# Patient Record
Sex: Female | Born: 1945 | Race: Black or African American | Hispanic: No | Smoking: Former smoker
Health system: Southern US, Community
[De-identification: ages and names within clinical notes are randomized; demographics above are authoritative.]

## PROBLEM LIST (undated history)

## (undated) DIAGNOSIS — H5440 Blindness, one eye, unspecified eye: Secondary | ICD-10-CM

## (undated) DIAGNOSIS — D869 Sarcoidosis, unspecified: Secondary | ICD-10-CM

## (undated) DIAGNOSIS — M24459 Recurrent dislocation, unspecified hip: Secondary | ICD-10-CM

## (undated) DIAGNOSIS — M810 Age-related osteoporosis without current pathological fracture: Secondary | ICD-10-CM

## (undated) DIAGNOSIS — H269 Unspecified cataract: Secondary | ICD-10-CM

## (undated) DIAGNOSIS — Z803 Family history of malignant neoplasm of breast: Secondary | ICD-10-CM

## (undated) DIAGNOSIS — E559 Vitamin D deficiency, unspecified: Secondary | ICD-10-CM

## (undated) DIAGNOSIS — J439 Emphysema, unspecified: Secondary | ICD-10-CM

## (undated) DIAGNOSIS — K219 Gastro-esophageal reflux disease without esophagitis: Secondary | ICD-10-CM

## (undated) DIAGNOSIS — F329 Major depressive disorder, single episode, unspecified: Secondary | ICD-10-CM

## (undated) DIAGNOSIS — I10 Essential (primary) hypertension: Secondary | ICD-10-CM

## (undated) HISTORY — PX: HIP ARTHROPLASTY: SHX981

## (undated) HISTORY — DX: Emphysema, unspecified: J43.9

## (undated) HISTORY — DX: Sarcoidosis, unspecified: D86.9

## (undated) HISTORY — PX: TUBAL LIGATION: SHX77

## (undated) HISTORY — DX: Blindness, one eye, unspecified eye: H54.40

## (undated) HISTORY — DX: Vitamin D deficiency, unspecified: E55.9

## (undated) HISTORY — DX: Unspecified cataract: H26.9

## (undated) HISTORY — DX: Gastro-esophageal reflux disease without esophagitis: K21.9

## (undated) HISTORY — DX: Recurrent dislocation, unspecified hip: M24.459

## (undated) HISTORY — DX: Family history of malignant neoplasm of breast: Z80.3

## (undated) HISTORY — PX: CATARACT EXTRACTION: SUR2

## (undated) HISTORY — DX: Age-related osteoporosis without current pathological fracture: M81.0

## (undated) HISTORY — DX: Essential (primary) hypertension: I10

## (undated) HISTORY — DX: Major depressive disorder, single episode, unspecified: F32.9

---

## 1999-11-20 ENCOUNTER — Inpatient Hospital Stay (HOSPITAL_COMMUNITY): Admission: AD | Admit: 1999-11-20 | Discharge: 1999-11-26 | Payer: Self-pay | Admitting: Family Medicine

## 1999-11-20 ENCOUNTER — Encounter: Payer: Self-pay | Admitting: Family Medicine

## 2000-08-11 HISTORY — PX: COLONOSCOPY: SHX174

## 2000-09-28 ENCOUNTER — Encounter: Payer: Self-pay | Admitting: Emergency Medicine

## 2000-09-29 ENCOUNTER — Encounter: Payer: Self-pay | Admitting: Internal Medicine

## 2000-09-29 ENCOUNTER — Inpatient Hospital Stay (HOSPITAL_COMMUNITY): Admission: EM | Admit: 2000-09-29 | Discharge: 2000-10-02 | Payer: Self-pay | Admitting: Emergency Medicine

## 2000-10-05 ENCOUNTER — Inpatient Hospital Stay (HOSPITAL_COMMUNITY): Admission: RE | Admit: 2000-10-05 | Discharge: 2000-10-10 | Payer: Self-pay | Admitting: Pulmonary Disease

## 2000-10-05 ENCOUNTER — Encounter: Payer: Self-pay | Admitting: Pulmonary Disease

## 2000-10-07 ENCOUNTER — Encounter: Payer: Self-pay | Admitting: Pulmonary Disease

## 2000-10-09 ENCOUNTER — Encounter: Payer: Self-pay | Admitting: Pulmonary Disease

## 2000-10-10 ENCOUNTER — Encounter: Payer: Self-pay | Admitting: Pulmonary Disease

## 2000-11-30 ENCOUNTER — Encounter: Payer: Self-pay | Admitting: Gastroenterology

## 2000-12-15 ENCOUNTER — Encounter (INDEPENDENT_AMBULATORY_CARE_PROVIDER_SITE_OTHER): Payer: Self-pay

## 2000-12-15 ENCOUNTER — Encounter: Payer: Self-pay | Admitting: Gastroenterology

## 2000-12-15 ENCOUNTER — Other Ambulatory Visit: Admission: RE | Admit: 2000-12-15 | Discharge: 2000-12-15 | Payer: Self-pay | Admitting: Gastroenterology

## 2001-02-23 ENCOUNTER — Encounter: Payer: Self-pay | Admitting: Gastroenterology

## 2001-02-23 ENCOUNTER — Ambulatory Visit (HOSPITAL_COMMUNITY): Admission: RE | Admit: 2001-02-23 | Discharge: 2001-02-23 | Payer: Self-pay | Admitting: Gastroenterology

## 2001-05-27 ENCOUNTER — Encounter: Payer: Self-pay | Admitting: Orthopedic Surgery

## 2001-06-01 ENCOUNTER — Ambulatory Visit: Admission: RE | Admit: 2001-06-01 | Discharge: 2001-06-01 | Payer: Self-pay | Admitting: Orthopedic Surgery

## 2001-06-17 ENCOUNTER — Encounter: Payer: Self-pay | Admitting: Orthopedic Surgery

## 2001-06-17 ENCOUNTER — Inpatient Hospital Stay (HOSPITAL_COMMUNITY): Admission: RE | Admit: 2001-06-17 | Discharge: 2001-06-21 | Payer: Self-pay | Admitting: Orthopedic Surgery

## 2001-09-22 ENCOUNTER — Ambulatory Visit (HOSPITAL_COMMUNITY): Admission: RE | Admit: 2001-09-22 | Discharge: 2001-09-22 | Payer: Self-pay | Admitting: Gastroenterology

## 2001-09-22 ENCOUNTER — Encounter: Payer: Self-pay | Admitting: Gastroenterology

## 2002-11-10 ENCOUNTER — Inpatient Hospital Stay (HOSPITAL_COMMUNITY): Admission: EM | Admit: 2002-11-10 | Discharge: 2002-11-12 | Payer: Self-pay | Admitting: Emergency Medicine

## 2002-11-10 ENCOUNTER — Encounter: Payer: Self-pay | Admitting: Orthopedic Surgery

## 2004-10-10 ENCOUNTER — Observation Stay (HOSPITAL_COMMUNITY): Admission: EM | Admit: 2004-10-10 | Discharge: 2004-10-10 | Payer: Self-pay | Admitting: Emergency Medicine

## 2004-11-05 ENCOUNTER — Ambulatory Visit: Payer: Self-pay | Admitting: Family Medicine

## 2004-11-25 ENCOUNTER — Inpatient Hospital Stay (HOSPITAL_COMMUNITY): Admission: EM | Admit: 2004-11-25 | Discharge: 2004-11-28 | Payer: Self-pay | Admitting: Emergency Medicine

## 2004-11-25 ENCOUNTER — Ambulatory Visit: Payer: Self-pay | Admitting: Critical Care Medicine

## 2004-12-14 ENCOUNTER — Observation Stay (HOSPITAL_COMMUNITY): Admission: EM | Admit: 2004-12-14 | Discharge: 2004-12-14 | Payer: Self-pay | Admitting: Emergency Medicine

## 2004-12-16 ENCOUNTER — Observation Stay (HOSPITAL_COMMUNITY): Admission: EM | Admit: 2004-12-16 | Discharge: 2004-12-16 | Payer: Self-pay | Admitting: Emergency Medicine

## 2004-12-27 ENCOUNTER — Ambulatory Visit: Payer: Self-pay | Admitting: Pulmonary Disease

## 2005-01-16 ENCOUNTER — Ambulatory Visit (HOSPITAL_COMMUNITY): Admission: RE | Admit: 2005-01-16 | Discharge: 2005-01-20 | Payer: Self-pay | Admitting: Orthopedic Surgery

## 2006-03-11 ENCOUNTER — Encounter (INDEPENDENT_AMBULATORY_CARE_PROVIDER_SITE_OTHER): Payer: Self-pay | Admitting: *Deleted

## 2006-03-11 ENCOUNTER — Other Ambulatory Visit: Admission: RE | Admit: 2006-03-11 | Discharge: 2006-03-11 | Payer: Self-pay | Admitting: Family Medicine

## 2006-03-11 ENCOUNTER — Ambulatory Visit: Payer: Self-pay | Admitting: Family Medicine

## 2006-03-11 ENCOUNTER — Encounter: Admission: RE | Admit: 2006-03-11 | Discharge: 2006-03-11 | Payer: Self-pay | Admitting: Family Medicine

## 2007-06-08 DIAGNOSIS — I10 Essential (primary) hypertension: Secondary | ICD-10-CM

## 2007-06-08 DIAGNOSIS — J309 Allergic rhinitis, unspecified: Secondary | ICD-10-CM | POA: Insufficient documentation

## 2007-06-08 DIAGNOSIS — F329 Major depressive disorder, single episode, unspecified: Secondary | ICD-10-CM

## 2007-06-08 DIAGNOSIS — F3289 Other specified depressive episodes: Secondary | ICD-10-CM

## 2007-06-08 HISTORY — DX: Major depressive disorder, single episode, unspecified: F32.9

## 2007-06-08 HISTORY — DX: Other specified depressive episodes: F32.89

## 2007-06-08 HISTORY — DX: Essential (primary) hypertension: I10

## 2008-04-26 ENCOUNTER — Emergency Department (HOSPITAL_COMMUNITY): Admission: EM | Admit: 2008-04-26 | Discharge: 2008-04-26 | Payer: Self-pay | Admitting: Emergency Medicine

## 2008-10-01 ENCOUNTER — Ambulatory Visit (HOSPITAL_COMMUNITY): Admission: EM | Admit: 2008-10-01 | Discharge: 2008-10-01 | Payer: Self-pay | Admitting: *Deleted

## 2009-01-18 ENCOUNTER — Encounter (INDEPENDENT_AMBULATORY_CARE_PROVIDER_SITE_OTHER): Payer: Self-pay | Admitting: *Deleted

## 2009-01-18 ENCOUNTER — Ambulatory Visit: Payer: Self-pay | Admitting: Internal Medicine

## 2009-01-18 ENCOUNTER — Encounter (INDEPENDENT_AMBULATORY_CARE_PROVIDER_SITE_OTHER): Payer: Self-pay | Admitting: Internal Medicine

## 2009-01-18 DIAGNOSIS — R61 Generalized hyperhidrosis: Secondary | ICD-10-CM | POA: Insufficient documentation

## 2009-01-18 DIAGNOSIS — R634 Abnormal weight loss: Secondary | ICD-10-CM

## 2009-01-18 DIAGNOSIS — R5381 Other malaise: Secondary | ICD-10-CM

## 2009-01-18 DIAGNOSIS — H269 Unspecified cataract: Secondary | ICD-10-CM

## 2009-01-18 DIAGNOSIS — R5383 Other fatigue: Secondary | ICD-10-CM | POA: Insufficient documentation

## 2009-01-18 DIAGNOSIS — R040 Epistaxis: Secondary | ICD-10-CM | POA: Insufficient documentation

## 2009-01-18 DIAGNOSIS — Z860100 Personal history of colon polyps, unspecified: Secondary | ICD-10-CM | POA: Insufficient documentation

## 2009-01-18 DIAGNOSIS — K219 Gastro-esophageal reflux disease without esophagitis: Secondary | ICD-10-CM

## 2009-01-18 HISTORY — DX: Gastro-esophageal reflux disease without esophagitis: K21.9

## 2009-01-18 HISTORY — DX: Unspecified cataract: H26.9

## 2009-01-18 LAB — CONVERTED CEMR LAB

## 2009-01-19 LAB — CONVERTED CEMR LAB
ALT: 16 units/L (ref 0–35)
Basophils Relative: 1 % (ref 0–1)
Bilirubin Urine: NEGATIVE
Chloride: 93 meq/L — ABNORMAL LOW (ref 96–112)
Creatinine, Ser: 0.76 mg/dL (ref 0.40–1.20)
GFR calc Af Amer: >60 mL/min (ref >60)
Hemoglobin, Urine: NEGATIVE
Hemoglobin: 13.6 g/dL (ref 12.0–15.0)
INR: 0.9 (ref 0.0–1.5)
Ketones, ur: NEGATIVE mg/dL
Lymphocytes Relative: 27 % (ref 12–46)
Lymphs Abs: 1 10*3/uL (ref 0.7–4.0)
MCHC: 34.1 g/dL (ref 30.0–36.0)
Monocytes Absolute: 0.5 10*3/uL (ref 0.1–1.0)
Monocytes Relative: 14 % — ABNORMAL HIGH (ref 3–12)
Neutro Abs: 2.1 10*3/uL (ref 1.7–7.7)
Nitrite: NEGATIVE
Potassium: 3.6 meq/L (ref 3.5–5.3)
Protein, ur: NEGATIVE mg/dL
Prothrombin Time: 12.1 s (ref 11.6–15.2)
RBC: 4.54 M/uL (ref 3.87–5.11)
Sodium: 138 meq/L (ref 135–145)
TSH: 0.538 microintl units/mL (ref 0.350–4.500)
Total Bilirubin: 0.4 mg/dL (ref 0.3–1.2)
Urobilinogen, UA: 1 (ref 0.0–1.0)
Vitamin B-12: 757 pg/mL (ref 211–911)
WBC: 3.7 10*3/uL — ABNORMAL LOW (ref 4.0–10.5)
aPTT: 33 s (ref 24–37)

## 2009-02-05 ENCOUNTER — Ambulatory Visit (HOSPITAL_COMMUNITY): Admission: RE | Admit: 2009-02-05 | Discharge: 2009-02-05 | Payer: Self-pay | Admitting: Internal Medicine

## 2009-02-05 ENCOUNTER — Encounter (INDEPENDENT_AMBULATORY_CARE_PROVIDER_SITE_OTHER): Payer: Self-pay | Admitting: *Deleted

## 2009-02-05 ENCOUNTER — Ambulatory Visit: Payer: Self-pay | Admitting: Internal Medicine

## 2009-02-05 DIAGNOSIS — D869 Sarcoidosis, unspecified: Secondary | ICD-10-CM

## 2009-02-05 HISTORY — DX: Sarcoidosis, unspecified: D86.9

## 2009-02-13 ENCOUNTER — Encounter (INDEPENDENT_AMBULATORY_CARE_PROVIDER_SITE_OTHER): Payer: Self-pay | Admitting: Internal Medicine

## 2009-02-14 ENCOUNTER — Telehealth: Payer: Self-pay | Admitting: *Deleted

## 2009-02-16 ENCOUNTER — Ambulatory Visit (HOSPITAL_COMMUNITY): Admission: RE | Admit: 2009-02-16 | Discharge: 2009-02-16 | Payer: Self-pay | Admitting: *Deleted

## 2009-02-20 ENCOUNTER — Ambulatory Visit: Payer: Self-pay | Admitting: Internal Medicine

## 2009-02-26 ENCOUNTER — Ambulatory Visit (HOSPITAL_COMMUNITY): Admission: RE | Admit: 2009-02-26 | Discharge: 2009-02-26 | Payer: Self-pay | Admitting: Internal Medicine

## 2009-02-28 ENCOUNTER — Ambulatory Visit: Payer: Self-pay | Admitting: Pulmonary Disease

## 2009-02-28 DIAGNOSIS — R05 Cough: Secondary | ICD-10-CM

## 2009-02-28 DIAGNOSIS — J439 Emphysema, unspecified: Secondary | ICD-10-CM

## 2009-02-28 HISTORY — DX: Emphysema, unspecified: J43.9

## 2009-03-19 ENCOUNTER — Ambulatory Visit: Payer: Self-pay | Admitting: Gastroenterology

## 2009-03-19 DIAGNOSIS — R634 Abnormal weight loss: Secondary | ICD-10-CM | POA: Insufficient documentation

## 2009-03-22 ENCOUNTER — Ambulatory Visit: Payer: Self-pay | Admitting: Internal Medicine

## 2009-03-26 ENCOUNTER — Encounter: Payer: Self-pay | Admitting: Gastroenterology

## 2009-03-30 ENCOUNTER — Ambulatory Visit: Payer: Self-pay | Admitting: Pulmonary Disease

## 2009-04-02 ENCOUNTER — Encounter (INDEPENDENT_AMBULATORY_CARE_PROVIDER_SITE_OTHER): Payer: Self-pay

## 2009-04-17 ENCOUNTER — Telehealth (INDEPENDENT_AMBULATORY_CARE_PROVIDER_SITE_OTHER): Payer: Self-pay

## 2009-04-23 ENCOUNTER — Encounter (INDEPENDENT_AMBULATORY_CARE_PROVIDER_SITE_OTHER): Payer: Self-pay | Admitting: Internal Medicine

## 2009-04-23 ENCOUNTER — Ambulatory Visit: Payer: Self-pay | Admitting: Infectious Diseases

## 2009-04-24 ENCOUNTER — Ambulatory Visit: Payer: Self-pay | Admitting: Gastroenterology

## 2009-04-24 LAB — CONVERTED CEMR LAB: BUN: 16 mg/dL (ref 6–23)

## 2009-04-25 ENCOUNTER — Ambulatory Visit: Payer: Self-pay | Admitting: Internal Medicine

## 2009-04-26 DIAGNOSIS — E876 Hypokalemia: Secondary | ICD-10-CM

## 2009-04-26 LAB — CONVERTED CEMR LAB
AST: 24 units/L (ref 0–37)
Albumin: 4.5 g/dL (ref 3.5–5.2)
BUN: 14 mg/dL (ref 6–23)
Calcium: 9.9 mg/dL (ref 8.4–10.5)
Chloride: 94 meq/L — ABNORMAL LOW (ref 96–112)
Glucose, Bld: 86 mg/dL (ref 70–99)
HDL: 88 mg/dL (ref >39)
LDL Cholesterol: 120 mg/dL — ABNORMAL HIGH (ref 0–99)
Potassium: 3.3 meq/L — ABNORMAL LOW (ref 3.5–5.3)
Sodium: 137 meq/L (ref 135–145)
Total Protein: 8.4 g/dL — ABNORMAL HIGH (ref 6.0–8.3)
Triglycerides: 79 mg/dL (ref <150)

## 2009-05-25 ENCOUNTER — Ambulatory Visit: Payer: Self-pay | Admitting: Pulmonary Disease

## 2009-05-25 DIAGNOSIS — J441 Chronic obstructive pulmonary disease with (acute) exacerbation: Secondary | ICD-10-CM

## 2009-06-02 DIAGNOSIS — R9389 Abnormal findings on diagnostic imaging of other specified body structures: Secondary | ICD-10-CM

## 2009-06-04 ENCOUNTER — Telehealth: Payer: Self-pay | Admitting: Gastroenterology

## 2009-06-06 ENCOUNTER — Ambulatory Visit: Payer: Self-pay | Admitting: Internal Medicine

## 2009-06-07 ENCOUNTER — Encounter (INDEPENDENT_AMBULATORY_CARE_PROVIDER_SITE_OTHER): Payer: Self-pay | Admitting: Internal Medicine

## 2009-06-07 LAB — CONVERTED CEMR LAB
BUN: 15 mg/dL (ref 6–23)
Chloride: 101 meq/L (ref 96–112)
Potassium: 3.9 meq/L (ref 3.5–5.3)
Sodium: 140 meq/L (ref 135–145)

## 2010-02-10 ENCOUNTER — Emergency Department (HOSPITAL_COMMUNITY): Admission: EM | Admit: 2010-02-10 | Discharge: 2010-02-10 | Payer: Self-pay | Admitting: Emergency Medicine

## 2010-05-16 ENCOUNTER — Ambulatory Visit (HOSPITAL_COMMUNITY): Admission: EM | Admit: 2010-05-16 | Discharge: 2010-05-16 | Payer: Self-pay | Admitting: Emergency Medicine

## 2010-06-11 ENCOUNTER — Inpatient Hospital Stay (HOSPITAL_COMMUNITY): Admission: EM | Admit: 2010-06-11 | Discharge: 2010-06-17 | Payer: Self-pay | Admitting: Emergency Medicine

## 2010-06-11 ENCOUNTER — Ambulatory Visit: Payer: Self-pay | Admitting: Infectious Diseases

## 2010-06-11 ENCOUNTER — Encounter: Payer: Self-pay | Admitting: Internal Medicine

## 2010-06-11 ENCOUNTER — Ambulatory Visit: Payer: Self-pay | Admitting: Cardiology

## 2010-06-12 ENCOUNTER — Encounter (INDEPENDENT_AMBULATORY_CARE_PROVIDER_SITE_OTHER): Payer: Self-pay | Admitting: Emergency Medicine

## 2010-06-13 ENCOUNTER — Encounter: Payer: Self-pay | Admitting: Internal Medicine

## 2010-06-17 ENCOUNTER — Encounter: Payer: Self-pay | Admitting: Internal Medicine

## 2010-06-19 ENCOUNTER — Telehealth (INDEPENDENT_AMBULATORY_CARE_PROVIDER_SITE_OTHER): Payer: Self-pay | Admitting: *Deleted

## 2010-06-19 ENCOUNTER — Ambulatory Visit: Payer: Self-pay | Admitting: Internal Medicine

## 2010-06-19 DIAGNOSIS — E871 Hypo-osmolality and hyponatremia: Secondary | ICD-10-CM | POA: Insufficient documentation

## 2010-06-20 ENCOUNTER — Telehealth: Payer: Self-pay | Admitting: Licensed Clinical Social Worker

## 2010-06-20 LAB — CONVERTED CEMR LAB
BUN: 22 mg/dL (ref 6–23)
CO2: 28 meq/L (ref 19–32)
Glucose, Bld: 91 mg/dL (ref 70–99)
Potassium: 4.7 meq/L (ref 3.5–5.3)
Sodium: 128 meq/L — ABNORMAL LOW (ref 135–145)

## 2010-06-27 ENCOUNTER — Encounter: Payer: Self-pay | Admitting: Internal Medicine

## 2010-06-27 ENCOUNTER — Ambulatory Visit: Payer: Self-pay | Admitting: Internal Medicine

## 2010-06-27 DIAGNOSIS — J069 Acute upper respiratory infection, unspecified: Secondary | ICD-10-CM | POA: Insufficient documentation

## 2010-06-27 LAB — CONVERTED CEMR LAB
BUN: 11 mg/dL (ref 6–23)
CO2: 29 meq/L (ref 19–32)
Calcium: 9.3 mg/dL (ref 8.4–10.5)
Creatinine, Ser: 0.68 mg/dL (ref 0.40–1.20)
Glucose, Bld: 89 mg/dL (ref 70–99)
Sodium: 131 meq/L — ABNORMAL LOW (ref 135–145)

## 2010-06-28 ENCOUNTER — Ambulatory Visit: Payer: Self-pay | Admitting: Cardiovascular Disease

## 2010-06-28 DIAGNOSIS — M24459 Recurrent dislocation, unspecified hip: Secondary | ICD-10-CM

## 2010-06-28 DIAGNOSIS — R072 Precordial pain: Secondary | ICD-10-CM

## 2010-06-28 DIAGNOSIS — I739 Peripheral vascular disease, unspecified: Secondary | ICD-10-CM

## 2010-06-28 HISTORY — DX: Recurrent dislocation, unspecified hip: M24.459

## 2010-07-16 ENCOUNTER — Ambulatory Visit (HOSPITAL_COMMUNITY)
Admission: EM | Admit: 2010-07-16 | Discharge: 2010-07-16 | Payer: Self-pay | Source: Home / Self Care | Admitting: Emergency Medicine

## 2010-07-16 ENCOUNTER — Encounter: Payer: Self-pay | Admitting: Internal Medicine

## 2010-07-16 ENCOUNTER — Telehealth (INDEPENDENT_AMBULATORY_CARE_PROVIDER_SITE_OTHER): Payer: Self-pay | Admitting: Radiology

## 2010-07-17 ENCOUNTER — Encounter (HOSPITAL_COMMUNITY): Admission: RE | Admit: 2010-07-17 | Payer: Self-pay | Admitting: Cardiovascular Disease

## 2010-07-22 ENCOUNTER — Ambulatory Visit: Payer: Self-pay | Admitting: Internal Medicine

## 2010-07-23 ENCOUNTER — Encounter: Payer: Self-pay | Admitting: Internal Medicine

## 2010-07-23 DIAGNOSIS — R609 Edema, unspecified: Secondary | ICD-10-CM

## 2010-07-24 ENCOUNTER — Ambulatory Visit: Payer: Self-pay | Admitting: Cardiovascular Disease

## 2010-09-05 NOTE — Discharge Summary (Addendum)
  [name], [name] NO.:  [id number]  MEDICAL RECORD NO.:  [id number]           PATIENT TYPE:  LOCATION:                                 FACILITY:  PHYSICIAN:  Myrtie Neither, MD      DATE OF BIRTH:  1946-03-30  DATE OF ADMISSION: DATE OF DISCHARGE:                              DISCHARGE SUMMARY   ADMITTING DIAGNOSIS:  Recurrent dislocation of right total hip.  DISCHARGE DIAGNOSIS:  Recurrent dislocation of right total hip.  COMPLICATIONS:  None.  INFECTIONS:  None.  OPERATION:  Closed manipulated reduction of the right total hip.  PERTINENT HISTORY:  This is a 65 year old female who states that she was at a friend's house and standing on a couch that she knew was too low and then suddenly felt a pop in her right hip and had realized that she had dislocation hip.  The patient came to [facility] for treatment and was found to have dislocation of right total hip.  Pertinent physical exam of right hip, shortened, internally rotated, old surgical scar.  Neurovascular status is intact.  X-ray revealed dislocation of right total hip.  The patient underwent closed manipulated reduction of right total hip under anesthesia, tolerated procedure quite well.  Postop course fairly benign.  The patient was kept overnight and was discharged with knee immobilizer, weightbearing as tolerated on the right side.  Hydrocodone 1-2 q.6 h. p.r.n. for pain and to return to the office in 1 week.  The patient was discharged in stable and satisfactory condition.     Myrtie Neither, MD     AC/MEDQ  D:  08/21/2010  T:  08/22/2010  Job:  [id number]  Electronically Signed by Myrtie Neither MD on 09/05/2010 12:20:39 PM

## 2010-09-10 NOTE — Initial Assessments (Signed)
INTERNAL MEDICINE ADMISSION HISTORY AND PHYSICAL  PCP: Dr. Anselm Jungling.  1st contact Dr Anselm Jungling [phone] 2nd contact Dr Gilford Rile  [phone] Holidays or 5pm on weekdays:  1st contact [phone] 2nd contact [phone]  CC: SOB, cough  HPI: 65 yo female with PMH of HTN, sarcoidosis, tobacco abuse, COPD presents with a productive cough, chest tightness x 3 weeks and it has been getting worst.  She also endorses coughing up yellow phlegm and have tried Mucinex without much relief.  She has SOB when she walks, weakness, dizziness, headache, and ringing in her ears.  She was given Tylenol in the ED for her headache which resolved.  Denied any pleuritic chest pain, nausea, vomiting.  She also denied any increase in urinary frequency or dysuria.  She does have some muscle spasm pain after her hip surgery on 05/17/2010. She ran out of her HCTZ meds on the day of admission.   ALLERGIES:  ! PCN ! ASPIRIN (ASPIRIN) ! LISINOPRIL (LISINOPRIL) ! DIOVAN (VALSARTAN)  PAST MEDICAL HISTORY: HIP PAIN, RIGHT (ICD-719.45) SARCOIDOSIS (ICD-135)--stage IV with underlying bronchiectasis CATARACT, LEFT EYE (ICD-366.9) TOBACCO USER (ICD-305.1) HIP PAIN, LEFT (ICD-719.45) HYPERTENSION (ICD-401.9) DEPRESSION (ICD-311) ALLERGIC RHINITIS (ICD-477.9) Internal hemorrhoids Hx Colonic Ulcers  12/2000 Anxiety Disorder Arthritis Asthma COPD PSH: closed manipulative reduction of Right Hip on 05/17/2010 for total hip dislocation  MEDICATIONS: MAXZIDE-25 37.5-25 MG TABS (TRIAMTERENE-HCTZ) Take 1 tablet by mouth every morning SYMBICORT 160-4.5 MCG/ACT  AERO (BUDESONIDE-FORMOTEROL FUMARATE) Inhale 2 puffs two times a day VENTOLIN HFA 108 (90 BASE) MCG/ACT AERS (ALBUTEROL SULFATE) Take one to two puffs every 8 hours as needed for shortness of breath AMLODIPINE BESYLATE 5 MG TABS (AMLODIPINE BESYLATE) Take 1 tablet by mouth once a day MULTIVITAMINS  CAPS (MULTIPLE VITAMIN) Take 1 tablet by mouth once a day SPIRIVA HANDIHALER 18 MCG  CAPS  (TIOTROPIUM BROMIDE MONOHYDRATE) one puff in handihaler daily CHANTIX STARTING MONTH PAK 0.5 MG X 11 & 1 MG X 42 TABS (VARENICLINE TARTRATE) Take as directed.   SOCIAL HISTORY: Married with 4 children. retired.  previously worked as a Naval architect. Current Smoker. started at age 65.  1/2 ppd.   Alcohol use-yes, drink wine "depends if it is my anniversary or not" Regular exercise-no   FAMILY HISTORY: Family History Breast cancer 1st degree relative <50 Family History Diabetes 1st degree relative Family History Hypertension Family History Other cancer Mom:  hx of breast cancer Aunt: hx of breast cancer Sister:  Hx of breast cancer asthma: daughter, maternal aunt cancer: mother (breast), maternal aunt (breast)   ROS: As per HPI, all other systems reviewed and negative  VITALS:  T: 98.8  P: 133--> 90s BP: 173/113  --> 135/90  R:20  O2SAT: 94% ON: RA  PHYSICAL EXAM: General:  alert, well-developed, and cooperative to examination.   Head:  normocephalic and atraumatic.   Eyes:  vision grossly intact, pupils equal, pupils round, pupils reactive to light, no injection and anicteric.   Mouth:  pharynx pink and moist, no erythema, and no exudates.   Neck:  supple, full ROM, no thyromegaly, no JVD, and no carotid bruits.   Lungs:  normal respiratory effort, no accessory muscle use, diffused wheezes, + rhonchi Heart:  Tachy, regular rhythm, no murmur, no gallop, and no rub.   Abdomen:  soft, non-tender, normal bowel sounds, no distention, no guarding, no rebound tenderness, no hepatomegaly, and no splenomegaly.   Msk:  no joint swelling, no joint warmth, and no redness over joints.   Pulses:  2+ DP/PT  pulses bilaterally Extremities:  No cyanosis, clubbing, edema  Neurologic:  alert & oriented X3, cranial nerves II-XII intact, strength normal in all extremities, sensation intact to light touch, and gait normal.   Skin:  turgor normal and no rashes.   Psych:  Oriented X3, memory  intact for recent and remote, normally interactive, good eye contact, not anxious appearing, and not depressed appearing.   Chest Xray 06/11/2010  Clinical Data: Cough, shortness of breath, wheezing    CHEST - 2 VIEW    Comparison: Chest x-ray of 02/05/2009 and CT chest of 02/16/2009    Findings: The lungs are hyperaerated consistent with COPD.   Opacities are noted in both mid and upper lung fields in this   patient with history of sarcoidosis, consistent with areas of   scarring.  Superimposed pneumonia would be very difficult to   exclude.  No definite new infiltrate or effusion is seen.   Mediastinal contours appear stable.  The heart is within normal   limits in size.  No bony abnormality is seen.    IMPRESSION:    1.  Opacities bilaterally appear most typical of scarring in this   patient with sarcoidosis.  Superimposed pneumonia would be   difficult to exclude.   2.  COPD.  EKG 06/11/2010: Sinus Tachycardia with 120bpm  LABS: CBC:  WBC                                      7.9               4.0-10.5         K/uL  RBC                                      4.46              3.87-5.11        MIL/uL  Hemoglobin (HGB)                         12.1              12.0-15.0        g/dL  Hematocrit (HCT)                         35.3       l      36.0-46.0        %  MCV                                      79.1              78.0-100.0       fL  MCH -                                    27.1              26.0-34.0        pg  MCHC  34.3              30.0-36.0        g/dL  RDW                                      15.5              11.5-15.5        %  Platelet Count (PLT)                     249               150-400          K/uL  Neutrophils, %                           93         h      43-77            %  Lymphocytes, %                           4          l      12-46            %  Monocytes, %                             3                 3-12              %  Eosinophils, %                           0                 0-5              %  Basophils, %                             0                 0-1              %  Neutrophils, Absolute                    7.4               1.7-7.7          K/uL  Lymphocytes, Absolute                    0.3        l      0.7-4.0          K/uL  Monocytes, Absolute                      0.3               0.1-1.0          K/uL  Eosinophils, Absolute  0.0               0.0-0.7          K/uL  Basophils, Absolute                      0.0               0.0-0.1          K/uL  BMET:  Sodium (NA)                              130        l      135-145          mEq/L  Chloride                                 87         l      96-112           mEq/L  CO2                                      28                19-32            mEq/L  Glucose                                  230        h      70-99            mg/dL  BUN                                      7                 6-23             mg/dL  Creatinine                               0.82              0.4-1.2          mg/dL  GFR, Est Non African American            >60               >60              mL/min  GFR, Est African American                >60               >60              mL/min    Oversized comment, see footnote  1  Calcium                                  9.0  8.4-10.5         mg/dL   Urinalysis:  Specific Gravity                         1.021             1.005-1.030  pH                                       6.0               5.0-8.0  Urine Glucose                            500        a      NEG              mg/dL  Bilirubin                                SMALL      a      NEG  Ketones                                  15         a      NEG              mg/dL  Blood                                    NEGATIVE          NEG  Protein                                  30         a      NEG              mg/dL  Urobilinogen                              1.0               0.0-1.0          mg/dL  Nitrite                                  NEGATIVE          NEG  Leukocytes                               TRACE      a      NEG  Urine Microscopy: Result Name                              Result     Abnl   Normal Range     Units      Perf. Loc.  Squamous Epithelial /  LPF                FEW        a      RARE  WBC / HPF                                7-10              <3               WBC/hpf  RBC / HPF                                0-2               <3               RBC/hpf  Bacteria / HPF                           RARE              RARE  ASSESSMENT AND PLAN:  1. COPD exacerbation/bronchitis: This patient with a history of COPD presenting with SOB and a chronic cough has a clinical picture consistent with chronic bronchitis, bronchiectasis and COPD exacerbation. We will place heron solumedrol, albuterol and atrovent nebs, doxycycline 100 mg by mouth two times a day. Will provide supplemental oxygen as necessary. Will check for other causes that could precipitate this exacerbation, like ischemia, infection. Will check cardiac enzymes and UA. Chest X-ray - showed COPD, and opacities bilaterally appear most typical of scarring in this patient with sarcoidosis.  Superimposed pneumonia would be difficult to exclude.  2. Tachycardia: most likely secondary to her desaturation. Other differential dx include: dehydration, thyroid disease, or infection given her yellow sputum x 3 weeks that is progressively worse; however, she has been afebrile.   Her tachycardia resolved in the ED after neb treatments.  Will recheck EKG tomorrow morning. Will check TSH   3. Hyperglycemia: Glu 230. Most likely from steroids. But will check her HbA1c given her positive family history.    4. ESSENTIAL HYPERTENSION - BP in ED 139/75, will hold her antihypertensive meds for now given her tachycardia in ED.   Patient is on Amlodipine and Maxzide at home. Will continue  to monitor. BMET, CBC, FLP  5. Tobacco abuse: Nicoderm patch 7mg  change daily and smoking cessation counselling   VTE prophylaxis: Lovenox 40mg  [place] daily  Dispo: Home once clinically improved     ATTENDING: I performed and/or observed a history and physical examination of the patient.  I discussed the case with the residents as noted and reviewed the residents' notes.  I agree with the findings and plan--please refer to the attending physician note for more details.  Signature________________________________  Printed Name_____________________________

## 2010-09-10 NOTE — Assessment & Plan Note (Signed)
Summary: family hx of heart dx/mt      Allergies Added:   Visit Type:  new pt Referring Provider:  Rufina Falco MD Primary Provider:  Joaquin Courts  MD  CC:  shortness of breath and dizziness.  History of Present Illness: 65 yo AAF with history of end stage sarcoidosis, COPD, tobacco abuse and family history of CAD who is here today to establish cardiac care. She was admitted to [facility] on 06/11/10 with SOB felt to be secondary to her severe lung disease. She had an episode of chest pain. Her cardiac enzymes were mildly elevated at 0.11. Dr. Gala Romney saw her in consultation and recommended an echo which was normal. An outpatient stress myoview was also recommnded after discharge. No chest pain since discharge. She has been feeling well. Her breathing has been at baseline. No cough or fevers. Her lung issues are followed by Dr. Shelle Iron in the past. She also complains of pain in both calf muscles with ambulation.    Allergies (verified): 1)  ! Pcn 2)  ! Aspirin (Aspirin) 3)  ! Lisinopril (Lisinopril) 4)  ! Diovan (Valsartan)  Past History:  Past Medical History: Reviewed history from 03/19/2009 and no changes required. 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 Family History Breast cancer 1st degree relative <50 Family History Diabetes 1st degree relative Family History Hypertension Family History Other cancer Mom:  hx of breast  cancer Aunt: hx of breast cancer Sister:  Hx of breast cancer  Mother-deceased breast cancer-had MI Father-decreased CVA Sister has ?CAD  allergies: son asthma: daughter, maternal aunt cancer: mother (breast), maternal aunt (breast)  Social History: Reviewed history from 05/25/2009 and no changes required. 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 started at age 62.  2 cigs per day.   Alcohol use-yes, not every day Regular exercise-no  Review of Systems       The patient complains of chest pain and shortness of breath.  The patient denies fatigue, malaise, fever, weight gain/loss, vision loss, decreased hearing, hoarseness, palpitations, prolonged cough, wheezing, sleep apnea, coughing up blood, abdominal pain, blood in stool, nausea, vomiting, diarrhea, heartburn, incontinence, blood in urine, muscle weakness, joint pain, leg swelling, rash, skin lesions, headache, fainting, dizziness, depression, anxiety, enlarged lymph nodes, easy bruising or bleeding, and environmental allergies.         Bilateral leg pain with ambulation.    Vital Signs:  Patient profile:   65 year old female Height:      65 inches Weight:      108.50 pounds BMI:     18.12 Pulse rate:   104 / minute BP sitting:   129 / 78  (right arm) Cuff size:   regular  Vitals Entered By: Caralee Ates CMA (June 28, 2010 10:08 AM)  Physical Exam  General:  General: Well developed, well nourished, NAD HEENT: OP clear, mucus membranes moist SKIN: warm, dry Neuro: No focal deficits Musculoskeletal: Muscle strength 5/5 all ext Psychiatric: Mood and affect normal Neck: No JVD, no carotid bruits, no thyromegaly, no lymphadenopathy. Lungs:Clear bilaterally, no wheezes, rhonci, crackles  CV: RRR no murmurs, gallops rubs Abdomen: soft, NT, ND, BS present Extremities: No edema, pulses non-palpable bilateral lower ext.     Echocardiogram  Procedure date:   06/12/2010  Findings:      Left ventricle: The cavity size was normal. There was mild focal     basal hypertrophy of the septum. Systolic function was normal. The     estimated ejection fraction was in the range of 55% to 60%.     Although no diagnostic regional wall motion abnormality was     identified, this possibility cannot be completely excluded on the     basis of this study. Doppler parameters are consistent with     abnormal left ventricular relaxation (grade 1 diastolic     dysfunction).   - Aortic valve: There was no stenosis.   - Mitral valve: No significant regurgitation.   - Right ventricle: Poorly visualized. The cavity size was normal.     Systolic function was normal.   - Pulmonary arteries: PA systolic pressure 51- 55 mmHg.   - Systemic veins: IVC measured 1.8 cm with normal respirophasic     variation, suggesting RA pressure 6-10 mmHg.   - Pericardium, extracardiac: A trivial pericardial effusion was     identified.  EKG  Procedure date:  06/28/2010  Findings:      Sinus tach, rate 106 bpm. PACs. Poor R wave progression through the precordial leads.   Impression & Recommendations:  Problem # 1:  CHEST PAIN-PRECORDIAL (ICD-786.51)  She had chest pain in the hospital felt to be non-cardiac with mildly abnormal cardiac enzymes. Will get Lexiscan myoview to assess for ischemia.  Her updated medication list for this problem includes:    Amlodipine Besylate 10 Mg Tabs (Amlodipine besylate) .Marland Kitchen... Take 1 tablet by mouth  once daily  Her updated medication list for this problem includes:    Amlodipine Besylate 10 Mg Tabs (Amlodipine besylate) .Marland Kitchen... Take 1 tablet by mouth  once daily  Problem # 2:  LIMB PAIN (ICD-729.5) Check ABIs.   Other Orders: EKG w/ Interpretation (93000) Nuclear Stress Test (Nuc Stress Test) Arterial Duplex Lower Extremity (Arterial Duplex Low)  Patient Instructions: 1)  Your physician recommends that you schedule a follow-up appointment  in: 3-4 weeks 2)  Your physician recommends that you continue on your current medications as directed. Please refer to the Current Medication list given to you today. 3)  Your physician has requested that you have an ankle brachial index (ABI). During this test an ultrasound and blood pressure cuff are used to evaluate the arteries that supply the arms and legs with blood. Allow thirty minutes for this exam. There are no restrictions or special instructions. 4)  Your physician has requested that you have an lexiscan myoview.  For further information please visit [email or web address]  Please follow instruction sheet, as given.

## 2010-09-10 NOTE — Discharge Summary (Signed)
Summary: Hospital Discharge Update    Hospital Discharge Update:  Date of Admission: 06/11/2010 Date of Discharge: 06/17/2010  Brief Summary:  65 yo woman with 3 wks history of exertional SOB, weakness, dizziness, productive cough presents with COPD exacerbation and tachycardic. She was treated with Albuterol and Atrovent nebs, O2 via [place], Humibid, IV solumedrol then prednisone tapering and Doxycycline during the hospital course and her SOB improved.  She did have an elevated Tro of 0.12 which could have been due to increase demand of the heart.   Cardio was consulted, 2D echo showed 55-60% EF, grade 1 diastolic dysfunction, pulm HTN.  Cardio agreed of getting a myoview as outpatient given her person al positive risk factors(she was told that she had a MI in the past) and first degree relatives with MIs. TSH was 0.234; however, her T4 was normal 1.19.   Labs needed at follow-up: CBC with differential, Basic metabolic panel  Other follow-up issues:  Please send patient to cardiology for a myoview Monitor her BP and adjust her meds appropriately because she was hypertensive 170s/100s on admission Please recheck her Na level because it was 137 oon admission and trended down to 127, and up 128 with serum osm 265.  She reported drinking a lot of water while she was in the hospital. She was asymptomatic.     Medication list changes:  Removed medication of CHANTIX STARTING MONTH PAK 0.5 MG X 11 & 1 MG X 42 TABS (VARENICLINE TARTRATE) Take as directed. Added new medication of PREDNISONE 10 MG TABS (PREDNISONE) take 4 tablets x 2 days, then 2 tablets x 2 days, then 1 tablet x 2 days, then stop. - Signed Added new medication of GUAIFENESIN DAC 30-10-100 MG/5ML SOLN (PSEUDOEPHEDRINE-CODEINE-GG) take 1 teaspoon every 4-6 hours as needed for cough - Signed Added new medication of DOXYCYCLINE HYCLATE 100 MG TABS (DOXYCYCLINE HYCLATE) 1 tablet by mouth two times a day for 4 days - Signed Rx of PREDNISONE 10  MG TABS (PREDNISONE) take 4 tablets x 2 days, then 2 tablets x 2 days, then 1 tablet x 2 days, then stop.;  #14 x 0;  Signed;  Entered by: Rosana Berger MD;  Authorized by: Rosana Berger MD;  Method used: Electronically to [facility] [id number] [place], [place], [place]  [address], Ph: [phone], Fax: [phone] Rx of GUAIFENESIN DAC 30-10-100 MG/5ML SOLN (PSEUDOEPHEDRINE-CODEINE-GG) take 1 teaspoon every 4-6 hours as needed for cough;  #1 x 0;  Signed;  Entered by: Rosana Berger MD;  Authorized by: Rosana Berger MD;  Method used: Historical Rx of DOXYCYCLINE HYCLATE 100 MG TABS (DOXYCYCLINE HYCLATE) 1 tablet by mouth two times a day for 4 days;  #8 x 0;  Signed;  Entered by: Rosana Berger MD;  Authorized by: Rosana Berger MD;  Method used: Electronically to [facility] [address], [place], [place], [place]  [address], Ph: [phone], Fax: [phone]  The medication, problem, and allergy lists have been updated.  Please see the dictated discharge summary for details.  Discharge medications:  MAXZIDE-25 37.5-25 MG TABS (TRIAMTERENE-HCTZ) Take 1 tablet by mouth every morning SYMBICORT 160-4.5 MCG/ACT  AERO (BUDESONIDE-FORMOTEROL FUMARATE) Inhale 2 puffs two times a day VENTOLIN HFA 108 (90 BASE) MCG/ACT AERS (ALBUTEROL SULFATE) Take one to two puffs every 8 hours as needed for shortness of breath AMLODIPINE BESYLATE 5 MG TABS (AMLODIPINE BESYLATE) Take 1 tablet by mouth once a day MULTIVITAMINS  CAPS (MULTIPLE VITAMIN)  Take 1 tablet by mouth once a day SPIRIVA HANDIHALER 18 MCG  CAPS (TIOTROPIUM BROMIDE MONOHYDRATE) one puff in handihaler daily PREDNISONE 10 MG TABS (PREDNISONE) take 4 tablets x 2 days, then 2 tablets x 2 days, then 1 tablet x 2 days, then stop. GUAIFENESIN DAC 30-10-100 MG/5ML SOLN (PSEUDOEPHEDRINE-CODEINE-GG) take 1 teaspoon every 4-6 hours as needed for cough DOXYCYCLINE HYCLATE 100 MG TABS (DOXYCYCLINE HYCLATE) 1 tablet by mouth two times  a day for 4 days  Other patient instructions:  Please follow up with Dr. Cathey Endow on Wednesday, 06/19/2010 at 10:30 AM Please take the prednisone tapering dose as prescribed.  I have already sent the prescription to your pharmacy Take Guaifenesin 1 teaspoon eery 4-6 hours as needed for cough Continue taking your antibiotics: Doxycycline 100mg  two times a day x 4 days I sent both of the prescriptions to your pharmacy [facility] already, please pick them up. Continue all of your other medications   Note: Hospital Discharge Medications & Other Instructions handout was printed, one copy for patient and a second copy to be placed in hospital chart.

## 2010-09-10 NOTE — Progress Notes (Signed)
Summary: med refill/gp  Phone Note Refill Request Message from:  Patient on June 19, 2010 2:59 PM  Refills Requested: Medication #1:  MULTIVITAMINS  CAPS Take 1 tablet by mouth once a day  Medication #2:  GUAIFENESIN DAC 30-10-100 MG/5ML SOLN take 1 teaspoon every 4-6 hours as needed for cough  Pt. stated the medications were not called nor faxed to the pharmacy when she was discharged.   Method Requested: Telephone to Pharmacy Initial call taken by: Chinita Pester RN,  June 19, 2010 2:59 PM  Follow-up for Phone Call        Rx called to pharmacy - [facility]. Follow-up by: Chinita Pester RN,  June 20, 2010 9:04 AM    Prescriptions: GUAIFENESIN DAC 30-10-100 MG/5ML SOLN (PSEUDOEPHEDRINE-CODEINE-GG) take 1 teaspoon every 4-6 hours as needed for cough  #4 oz x 2   Entered and Authorized by:   Ulyess Mort MD   Signed by:   Ulyess Mort MD on 06/19/2010   Method used:   Telephoned to ...       [facility] [id number]* (retail)       [address] [place], [place]  [address]       Ph: [phone]       Fax: [phone]   RxID:   [id number] MULTIVITAMINS  CAPS (MULTIPLE VITAMIN) Take 1 tablet by mouth once a day  #30 x 11   Entered and Authorized by:   Ulyess Mort MD   Signed by:   Ulyess Mort MD on 06/19/2010   Method used:   Telephoned to ...       [facility] [id number] (retail)       [address] [place], [place]  [address]       Ph: [phone]       Fax: [phone]   RxID:   [id number]

## 2010-09-10 NOTE — Progress Notes (Signed)
Summary: Soc. Work  Programme researcher, broadcasting/film/video of Call: Spoke with patient who receives about $500 in early retirement Social Security.  She does not qualify for full Medicaid only a deductible.  For now I have encouraged her to complete her certification with [facility] tomorrow morning to see if she would qualify for orange card. Then she can see me for further problem-solving and resource referral. She was amenable to meeting with me.

## 2010-09-10 NOTE — Miscellaneous (Signed)
Summary: Orders Update  Clinical Lists Changes  Medications: Rx of SYMBICORT 160-4.5 MCG/ACT  AERO (BUDESONIDE-FORMOTEROL FUMARATE) Inhale 2 puffs two times a day;  #1 x 11;  Signed;  Entered by: Rosana Berger MD;  Authorized by: Rosana Berger MD;  Method used: Electronically to [facility] [phone]*, [address] [place], [place]  [address], Ph: [phone], Fax: [phone] Rx of VENTOLIN HFA 108 (90 BASE) MCG/ACT AERS (ALBUTEROL SULFATE) Take one to two puffs every 8 hours as needed for shortness of breath;  #1 x 11;  Signed;  Entered by: Rosana Berger MD;  Authorized by: Rosana Berger MD;  Method used: Electronically to [facility] [phone]*, [address] [place], [place]  [address], Ph: [phone], Fax: [phone] Rx of SPIRIVA HANDIHALER 18 MCG  CAPS (TIOTROPIUM BROMIDE MONOHYDRATE) one puff in handihaler daily;  #1 x 6;  Signed;  Entered by: Rosana Berger MD;  Authorized by: Rosana Berger MD;  Method used: Electronically to [facility] [phone]*, [address] [place], [place]  [address], Ph: [phone], Fax: [phone] Rx of AMLODIPINE BESYLATE 5 MG TABS (AMLODIPINE BESYLATE) Take 1 tablet by mouth once a day;  #30 x 3;  Signed;  Entered by: Rosana Berger MD;  Authorized by: Rosana Berger MD;  Method used: Electronically to [facility] [phone]*, [address], [place], [place]  [address], Ph: [phone], Fax: [phone] Rx of MAXZIDE-25 37.5-25 MG TABS (TRIAMTERENE-HCTZ) Take 1 tablet by mouth every morning;  #30 x 3;  Signed;  Entered by: Rosana Berger MD;  Authorized by: Rosana Berger MD;  Method used: Electronically to [facility] [phone]*, [address], [place], [place]  [address], Ph: [phone], Fax: [phone] Rx of PREDNISONE 10 MG TABS (PREDNISONE) take 4 tablets x 2 days, then 2 tablets x 2 days, then 1 tablet x 2 days, then stop.;  #14 x 0;  Signed;  Entered by: Rosana Berger MD;  Authorized by: Rosana Berger MD;  Method used: Electronically to [facility]  [phone]*, [address] [place], [place]  [address], Ph: [phone], Fax: [phone] Rx of DOXYCYCLINE HYCLATE 100 MG TABS (DOXYCYCLINE HYCLATE) 1 tablet by mouth two times a day for 4 days;  #8 x 0;  Signed;  Entered by: Rosana Berger MD;  Authorized by: Rosana Berger MD;  Method used: Electronically to [facility] [phone]*, [address], [place], [place]  [address], Ph: [phone], Fax: [phone]    Prescriptions: DOXYCYCLINE HYCLATE 100 MG TABS (DOXYCYCLINE HYCLATE) 1 tablet by mouth two times a day for 4 days  #8 x 0   Entered and Authorized by:   Rosana Berger MD   Signed by:   Rosana Berger MD on 06/17/2010   Method used:   Electronically to        [facility] [phone]* (retail)       [address]       [place], [place]  [address]       Ph: [phone]       Fax: [phone]   RxID:   [id number] PREDNISONE 10 MG TABS (PREDNISONE) take 4 tablets x 2 days, then 2 tablets x 2 days, then 1 tablet x 2 days, then stop.  #14 x 0   Entered and Authorized by:   Rosana Berger MD   Signed by:   Rosana Berger MD on 06/17/2010   Method used:   Electronically to        [facility] [phone]* (retail)  [address]       [place], [place]  [address]       Ph: [phone]       Fax: [phone]   RxID:   [id number] MWNUUVO-53 37.5-25 MG TABS (TRIAMTERENE-HCTZ) Take 1 tablet by mouth every morning  #30 x 3   Entered and Authorized by:   Rosana Berger MD   Signed by:   Rosana Berger MD on 06/17/2010   Method used:   Electronically to        [facility] [phone]* (retail)       [address]       [place], [place]  [address]       Ph: [phone]       Fax: [phone]   RxID:   [id number] AMLODIPINE BESYLATE 5 MG TABS (AMLODIPINE BESYLATE) Take 1 tablet by mouth once a day  #30 x 3   Entered and Authorized by:   Rosana Berger MD   Signed by:   Rosana Berger MD on 06/17/2010   Method used:   Electronically to        [facility] [phone]* (retail)        [address]       [place], [place]  [address]       Ph: [phone]       Fax: [phone]   RxID:   [id number] SPIRIVA HANDIHALER 18 MCG  CAPS (TIOTROPIUM BROMIDE MONOHYDRATE) one puff in handihaler daily  #1 x 6   Entered and Authorized by:   Rosana Berger MD   Signed by:   Rosana Berger MD on 06/17/2010   Method used:   Electronically to        [facility] [phone]* (retail)       [address]       [place], [place]  [address]       Ph: [phone]       Fax: [phone]   RxID:   [id number] VENTOLIN HFA 108 (90 BASE) MCG/ACT AERS (ALBUTEROL SULFATE) Take one to two puffs every 8 hours as needed for shortness of breath  #1 x 11   Entered and Authorized by:   Rosana Berger MD   Signed by:   Rosana Berger MD on 06/17/2010   Method used:   Electronically to        [facility] [phone]* (retail)       [address]       [place], [place]  [address]       Ph: [phone]       Fax: [phone]   RxID:   [id number] SYMBICORT 160-4.5 MCG/ACT  AERO (BUDESONIDE-FORMOTEROL FUMARATE) Inhale 2 puffs two times a day  #1 x 11   Entered and Authorized by:   Rosana Berger MD   Signed by:   Rosana Berger MD on 06/17/2010   Method used:   Electronically to        [facility] [phone]* (retail)       [address]       [place], [place]  [address]       Ph: [phone]       Fax: [phone]   RxID:   [id number]

## 2010-09-10 NOTE — Progress Notes (Signed)
Summary: Cancelling Nuclear and PV appt  Phone Note Outgoing Call Call back at [facility] Phone [phone]   Call placed by: Leonia Corona, RT-N,  July 16, 2010 3:57 PM Call placed to: Patient Reason for Call: Confirm/change Appt Summary of Call: Called patient to confirm Nuc and PV appt for 12/07. Patient is in hosp. with hip problem. She stated she is going to see Dr. Anselm Jungling on 12/12 in f/u and will call to resched after that appt. Initial call taken by: Leonia Corona, RT-N,  July 16, 2010 4:02 PM

## 2010-09-10 NOTE — Assessment & Plan Note (Signed)
Summary: HFU/SB.   Vital Signs:  Patient profile:   65 year old female Height:      65 inches (165.10 cm) Weight:      108.4 pounds (49.27 kg) BMI:     18.10 Temp:     97.2 degrees F (36.22 degrees C) oral Pulse rate:   103 / minute BP sitting:   164 / 86  (right arm)  Vitals Entered By: Theotis Barrio NT II (June 19, 2010 10:15 AM)  Serial Vital Signs/Assessments:  Time      Position  BP       Pulse  Resp  Temp     By 11:15AM             152/89   98                    Debra Ditzler RN  Comments: 11:15AM right arm By: Stanton Kidney Ditzler RN   CC: PATIENT IS HERE FOR HOSPITAL FOLLOW UP APPT Is Patient Diabetic? No Nutritional Status BMI of < 19 = underweight Nutritional Status Detail appetite good  Have you ever been in a relationship where you felt threatened, hurt or afraid?No   Does patient need assistance? Functional Status Self care Ambulation Normal Comments H- feeling better. Symbicort too expensive. Pt aware of appt with Dr Lorn Junes 06/28/10 10AM - on EMR - no records faxed.   Primary Care Provider:  Joaquin Courts  MD  CC:  PATIENT IS HERE FOR HOSPITAL FOLLOW UP APPT.  History of Present Illness: 65 y/o woman who was recently d/c from hospital on 11/7 comes in to the clinic today for a hospital follow up. Her admission diagnosis was COPD  for which she was d/c on tapering doses of prednisone and doxycycline (for 4 more days from d/c).Her hospital course was complicated by hyponatemia which was most likely hypervolemic hypnatremia given the serum osmolarity of 265 and pt admitted that she drank a lot of water during her stay. She also had some mild elevations in her troponins, also given her personal h/o and positive family h/o, op cardiology referal for myoview was requested.  As of today she reports feeling better. Her SOB is getting better.Denies any cough, fever, malaise, N/V.  She expressed  difficulty paying for her symbicort inhaler.   Depression  History:      The patient denies a depressed mood most of the day and a diminished interest in her usual daily activities.         Preventive Screening-Counseling & Management  Alcohol-Tobacco     Alcohol drinks/day: 1     Alcohol type: liquor on w/e's     Smoking Status: quit     Smoking Cessation Counseling: yes     Packs/Day: 2-3 cigs per day     Year Started: age 57 yrs old  Caffeine-Diet-Exercise     Does Patient Exercise: no  Current Medications (verified): 1)  Maxzide-25 37.5-25 Mg Tabs (Triamterene-Hctz) .... Take 1 Tablet By Mouth Every Morning 2)  Advair Diskus 250-50 Mcg/dose Aepb (Fluticasone-Salmeterol) .... Take 1 Inhalation Twice A Day 3)  Ventolin Hfa 108 (90 Base) Mcg/act Aers (Albuterol Sulfate) .... Take One To Two Puffs Every 8 Hours As Needed For Shortness of Breath 4)  Amlodipine Besylate 5 Mg Tabs (Amlodipine Besylate) .... Take 2  Tabletsby Mouth Once A Day 5)  Multivitamins  Caps (Multiple Vitamin) .... Take 1 Tablet By Mouth Once A Day 6)  Spiriva Handihaler 18 Mcg  Caps (Tiotropium Bromide Monohydrate) .... One Puff in Handihaler Daily 7)  Prednisone 10 Mg Tabs (Prednisone) .... Take 4 Tablets X 2 Days, Then 2 Tablets X 2 Days, Then 1 Tablet X 2 Days, Then Stop. 8)  Guaifenesin Dac 30-10-100 Mg/63ml Soln (Pseudoephedrine-Codeine-Gg) .... Take 1 Teaspoon Every 4-6 Hours As Needed For Cough 9)  Doxycycline Hyclate 100 Mg Tabs (Doxycycline Hyclate) .Marland Kitchen.. 1 Tablet By Mouth Two Times A Day For 4 Days  Allergies: 1)  ! Pcn 2)  ! Aspirin (Aspirin) 3)  ! Lisinopril (Lisinopril) 4)  ! Diovan (Valsartan)  Social History: Smoking Status:  quit  Review of Systems      See HPI  Physical Exam  General:  alert, well-developed, and well-nourished.   Head:  normocephalic and atraumatic.   Eyes:  vision grossly intact, pupils equal, pupils round, and pupils reactive to light.   Neck:  supple and full ROM.   Lungs:  normal respiratory effort, no intercostal  retractions, and normal breath sounds, occasional rhonchi b/l. Heart:  normal rate, regular rhythm, no murmur, no gallop, no rub, and no JVD.   Abdomen:  soft, non-tender, normal bowel sounds, no distention, and no masses.   Msk:  normal ROM, no joint tenderness, no joint swelling, no joint warmth, and no redness over joints.   Pulses:  2+pulses b/l. Extremities:  no edema cyanosis or clubbing. Neurologic:  alert & oriented X3 and cranial nerves II-XII intact.     Impression & Recommendations:  Problem # 1:  CHRONIC OBSTRUCTIVE PULMONARY DISEASE (ICD-496) Assessment Improved  She reports that her breathing is getting better.  Denies cough, fever, N/V. She is taking her doxycycline and prednisone.But she expressed difficulty paying for symbicort , so she was switched to advair. She was given some free samples for advair and spiriva from the clinic. She was asked to continue with doxycycline and prednisone. Given her financial difficulties, social woker referral was also done. Her updated medication list for this problem includes:    Advair Diskus 250-50 Mcg/dose Aepb (Fluticasone-salmeterol) .Marland Kitchen... Take 1 inhalation twice a day    Ventolin Hfa 108 (90 Base) Mcg/act Aers (Albuterol sulfate) .Marland Kitchen... Take one to two puffs every 8 hours as needed for shortness of breath    Spiriva Handihaler 18 Mcg Caps (Tiotropium bromide monohydrate) ..... One puff in handihaler daily  Her updated medication list for this problem includes:    Symbicort 160-4.5 Mcg/act Aero (Budesonide-formoterol fumarate) ..... Inhale 2 puffs two times a day    Ventolin Hfa 108 (90 Base) Mcg/act Aers (Albuterol sulfate) .Marland Kitchen... Take one to two puffs every 8 hours as needed for shortness of breath    Spiriva Handihaler 18 Mcg Caps (Tiotropium bromide monohydrate) ..... One puff in handihaler daily  Her updated medication list for this problem includes:    Symbicort 160-4.5 Mcg/act Aero (Budesonide-formoterol fumarate) ..... Inhale 2  puffs two times a day    Ventolin Hfa 108 (90 Base) Mcg/act Aers (Albuterol sulfate) .Marland Kitchen... Take one to two puffs every 8 hours as needed for shortness of breath    Spiriva Handihaler 18 Mcg Caps (Tiotropium bromide monohydrate) ..... One puff in handihaler daily  Orders: Social Work Referral (Social )  Problem # 2:  HYPOSMOLALITY AND/OR HYPONATREMIA (ICD-276.1) She had some hyponatremia during the hospital which was attributed to her drinking, a lot of water. She had low seum osmolarity of 265. Will recheck her serum sodium today.  Orders: T-Basic Metabolic Panel [phone])  Problem # 3:  FAMILY HISTORY OF ISCHEMIC HEART DISEASE (ICD-V17.3) Assessment: Unchanged  The patient had elevated troponins(as high as 0.11) during this admission in November 2011,also given a personal h/o of remote MI and positive family h/o MI in the first degree relatives, cardiology referral for myoview was done.  Orders: Cardiology Referral (Cardiology)  Problem # 4:  HYPERTENSION (ICD-401.9) Assessment: Deteriorated Her BP was running high today that could be related to anxiety or exertion, but throughout her hospital stay her BP was ranging , systolics in 110 and diastolics in 70's. No change in HTN regimen was made at this time. we will monitor her BP closely with the following visits. she was councelled to take low salt diet. Her updated medication list for this problem includes:    Maxzide-25 37.5-25 Mg Tabs (Triamterene-hctz) .Marland Kitchen... Take 1 tablet by mouth every morning    Amlodipine Besylate 5 Mg Tabs (Amlodipine besylate) .Marland Kitchen... Take 1 tablet by mouth once a day  Complete Medication List: 1)  Maxzide-25 37.5-25 Mg Tabs (Triamterene-hctz) .... Take 1 tablet by mouth every morning 2)  Advair Diskus 250-50 Mcg/dose Aepb (Fluticasone-salmeterol) .... Take 1 inhalation twice a day 3)  Ventolin Hfa 108 (90 Base) Mcg/act Aers (Albuterol sulfate) .... Take one to two puffs every 8 hours as needed for shortness  of breath 4)  Amlodipine Besylate 5 Mg Tabs (Amlodipine besylate) .... Take 1 tablet by mouth once a day 5)  Multivitamins Caps (Multiple vitamin) .... Take 1 tablet by mouth once a day 6)  Spiriva Handihaler 18 Mcg Caps (Tiotropium bromide monohydrate) .... One puff in handihaler daily 7)  Prednisone 10 Mg Tabs (Prednisone) .... Take 4 tablets x 2 days, then 2 tablets x 2 days, then 1 tablet x 2 days, then stop. 8)  Guaifenesin Dac 30-10-100 Mg/33ml Soln (Pseudoephedrine-codeine-gg) .... Take 1 teaspoon every 4-6 hours as needed for cough 9)  Doxycycline Hyclate 100 Mg Tabs (Doxycycline hyclate) .Marland Kitchen.. 1 tablet by mouth two times a day for 4 days  Patient Instructions: 1)  Please take your medicine s as prescribed. 2)  Please schedule a follow-up appointment in 1 month. 3)  please come to the clinic if your SOB, cough gets worse.   Orders Added: 1)  Est. Patient Level IV [28413] 2)  Social Work Referral [place] ] 3)  Cardiology Referral [Cardiology] 4)  T-Basic Metabolic Panel [phone]   Process Orders Check Orders Results:     [facility]: ABN not required for this insurance Tests Sent for requisitioning (June 20, 2010 12:13 PM):     06/19/2010: [facility] -- T-Basic Metabolic Panel [phone] (signed)     Prevention & Chronic Care Immunizations   Influenza vaccine: Fluvax 3+  (06/06/2009)    Tetanus booster: 08/11/2005: Historical    Pneumococcal vaccine: Historical  (08/11/2005)    H. zoster vaccine: Not documented  Colorectal Screening   Hemoccult: Not documented   Hemoccult action/deferral: Deferred  (06/06/2009)    Colonoscopy: Location:  [facility].    (12/15/2000)   Colonoscopy action/deferral: GI referral  (04/23/2009)  Other Screening   Pap smear: Specimen Adequacy: Satisfactory for evaluation.   Interpretation/Result:Negative for intraepithelial Lesion or Malignancy.     (01/18/2009)   Pap smear  due: 01/19/2012    Mammogram: No specific mammographic evidence of malignancy.  Assessment: BIRADS 1.   (02/20/2009)   Mammogram due: 02/20/2010    DXA bone density scan: Not documented   DXA bone density action/deferral: Ordered  (02/20/2009)   Smoking status:  quit  (06/19/2010)  Lipids   Total Cholesterol: 224  (04/23/2009)   Lipid panel action/deferral: Lipid Panel ordered   LDL: 120  (04/23/2009)   LDL Direct: Not documented   HDL: 88  (04/23/2009)   Triglycerides: 79  (04/23/2009)  Hypertension   Last Blood Pressure: 164 / 86  (06/19/2010)   Serum creatinine: 0.85  (06/07/2009)   Serum potassium 3.9  (06/07/2009)  Self-Management Support :   Personal Goals (by the next clinic visit) :      Personal blood pressure goal: 130/80  (06/19/2010)   Patient will work on the following items until the next clinic visit to reach self-care goals:     Medications and monitoring: take my medicines every day, bring all of my medications to every visit  (06/19/2010)     Eating: eat more vegetables, use fresh or frozen vegetables, eat foods that are low in salt, eat fruit for snacks and desserts, limit or avoid alcohol  (06/19/2010)     Activity: take a 30 minute walk every day  (06/19/2010)    Hypertension self-management support: Resources for patients handout, Written self-care plan, Education handout  (06/19/2010)   Hypertension self-care plan printed.   Hypertension education handout printed      Resource handout printed.

## 2010-09-10 NOTE — Miscellaneous (Signed)
Summary: Appointment Canceled  Appointment status changed to canceled by [employer] on 07/16/2010 3:48 PM.  Cancellation Comments --------------------- wt 108/lexiscan r/s/786.50/mcalhany/self/abi @ 12:00/saf  Appointment Information ----------------------- Appt Type:  CARDIOLOGY NUCLEAR TESTING      Date:  Wednesday, July 17, 2010      Time:  11:45 AM for 15 min   Urgency:  Routine   Made By:  Pearson Grippe  To Visit:  [id number]    Reason:  wt 108/lexiscan r/s/786.50/mcalhany/self/abi @ 12:00/saf  Appt Comments ------------- -- 07/16/10 15:48: ([facility]) CANCELED -- wt 108/lexiscan r/s/786.50/mcalhany/self/abi @ 12:00/saf -- 06/28/10 11:01: ([facility]) BOOKED -- Routine CARDIOLOGY NUCLEAR TESTING at 07/17/2010 11:45 AM for 15 min wt 108/lexiscan r/s/786.50/mcalhany/self/abi @ 12:00

## 2010-09-10 NOTE — Assessment & Plan Note (Signed)
Vital Signs:  Patient profile:   65 year old female Height:      65 inches Weight:      106 pounds BMI:     17.70 Temp:     97.9 degrees F Pulse rate:   109 / minute BP sitting:   132 / 79 Cuff size:   regular  Primary Care Provider:  Joaquin Courts  MD   History of Present Illness: 65 y/o woman with PMH significant for sarcoidosis, COPD, Tobacco abuse and hyponatremia comes to the clinic today for a f/u on her low sodium levels.   She reports nasal stuffiness, runny nose, post nasal drip and dry cough. Denies sore throat/fever/malaise/ N/V/fever.  She reports that she still feels SOB when she walks around the house and engage herself in lot of activities. But says it has significantly improved from the time she was here in the hospital.  Current Medications (verified): 1)  Maxzide-25 37.5-25 Mg Tabs (Triamterene-Hctz) .... Take 1 Tablet By Mouth Every Morning 2)  Advair Diskus 250-50 Mcg/dose Aepb (Fluticasone-Salmeterol) .... Take 1 Inhalation Twice A Day 3)  Ventolin Hfa 108 (90 Base) Mcg/act Aers (Albuterol Sulfate) .... Take One To Two Puffs Every 8 Hours As Needed For Shortness of Breath 4)  Amlodipine Besylate 5 Mg Tabs (Amlodipine Besylate) .... Take 1 Tablet By Mouth Once A Day 5)  Multivitamins  Caps (Multiple Vitamin) .... Take 1 Tablet By Mouth Once A Day 6)  Spiriva Handihaler 18 Mcg  Caps (Tiotropium Bromide Monohydrate) .... One Puff in Handihaler Daily 7)  Guaifenesin Dac 30-10-100 Mg/88ml Soln (Pseudoephedrine-Codeine-Gg) .... Take 1 Teaspoon Every 4-6 Hours As Needed For Cough  Allergies (verified): 1)  ! Pcn 2)  ! Aspirin (Aspirin) 3)  ! Lisinopril (Lisinopril) 4)  ! Diovan (Valsartan)  Review of Systems      See HPI  The patient denies anorexia, fever, chest pain, syncope, peripheral edema, and prolonged cough.    Physical Exam  General:  alert, well-developed, and well-nourished.   Head:  normocephalic and atraumatic.   Eyes:  vision grossly  intact, pupils equal, pupils round, and pupils reactive to light.   Mouth:  pharynx pink and moist.   Neck:  supple and full ROM.   Lungs:  normal respiratory effort, no intercostal retractions, no accessory muscle use, coarse breath sounds, no dullness, no fremitus.   Heart:  normal rate, regular rhythm, no murmur, no gallop, and no rub.   Abdomen:  soft, non-tender, normal bowel sounds, no distention, no masses, no guarding, and no rigidity.   Msk:  normal ROM, no joint tenderness, no joint swelling, no joint warmth, and no redness over joints.   Pulses:  2+pulses b/l. Extremities:  no yanois, clubbing or edema. Neurologic:  alert & oriented X3, cranial nerves II-XII intact, strength normal in all extremities, sensation intact to light touch, gait normal, and DTRs symmetrical and normal.     Impression & Recommendations:  Problem # 1:  HYPOSMOLALITY AND/OR HYPONATREMIA (ICD-276.1) Her low sodium levels during the hospital admission was thought  to be related to polydipsia. For her persistent low sodium levels(even after a  hospital f/u ), we will be changing her maxzide (because it contains HCTZ which can cause hyponatremia) to lasix.  Will check her BMET today.  Orders: T-Basic Metabolic Panel [id number])  Problem # 2:  CHRONIC OBSTRUCTIVE PULMONARY DISEASE, ACUTE EXACERBATION (ICD-491.21) Assessment: Improved She reports that she still feels SOB but this has improved significantly from the time  she was in the hospital. Denies fever/productive cough. She has completed her course of steroids and antibiotics. She takes her inhalers as prescribed. Continue the same treatment.  Problem # 3:  VIRAL URI (ICD-465.9) Assessment: New She complains of nasal stuffiness,nasal discharge, dry cough and post nasal drip but denies sore throat , fever, malaise. Will give her flonase nasal drops. Her updated medication list for this problem includes:    Guaifenesin Dac 30-10-100 Mg/63ml Soln  (Pseudoephedrine-codeine-gg) .Marland Kitchen... Take 1 teaspoon every 4-6 hours as needed for cough  Problem # 4:  HYPERTENSION (ICD-401.9) Her BP is fairly well controlled . But in the setting of hyponatremia , will increase the dose of her amlodipine and will change maxzide to lasix. The following medications were removed from the medication list:    Maxzide-25 37.5-25 Mg Tabs (Triamterene-hctz) .Marland Kitchen... Take 1 tablet by mouth every morning Her updated medication list for this problem includes:    Amlodipine Besylate 10 Mg Tabs (Amlodipine besylate) .Marland Kitchen... Take 1 tablet by mouth  once daily    Furosemide 20 Mg Tabs (Furosemide) .Marland Kitchen... Take 1 tablet by mouth daily  Complete Medication List: 1)  Advair Diskus 250-50 Mcg/dose Aepb (Fluticasone-salmeterol) .... Take 1 inhalation twice a day 2)  Ventolin Hfa 108 (90 Base) Mcg/act Aers (Albuterol sulfate) .... Take one to two puffs every 8 hours as needed for shortness of breath 3)  Amlodipine Besylate 10 Mg Tabs (Amlodipine besylate) .... Take 1 tablet by mouth  once daily 4)  Multivitamins Caps (Multiple vitamin) .... Take 1 tablet by mouth once a day 5)  Spiriva Handihaler 18 Mcg Caps (Tiotropium bromide monohydrate) .... One puff in handihaler daily 6)  Guaifenesin Dac 30-10-100 Mg/37ml Soln (Pseudoephedrine-codeine-gg) .... Take 1 teaspoon every 4-6 hours as needed for cough 7)  Furosemide 20 Mg Tabs (Furosemide) .... Take 1 tablet by mouth daily 8)  Flonase 50 Mcg/act Susp (Fluticasone propionate) .... 2 sprays  per nostril  every day.  Patient Instructions: 1)  Please schedule a follow-up appointment in 1 month. 2)  Please take your medicines as prescribed. Prescriptions: AMLODIPINE BESYLATE 10 MG TABS (AMLODIPINE BESYLATE) Take 1 tablet by mouth  once daily  #30 x 2   Entered and Authorized by:   Elyse Jarvis   Signed by:   Elyse Jarvis on 06/27/2010   Method used:   Print then Give to Patient   RxID:   [id number] FLONASE 50 MCG/ACT SUSP  (FLUTICASONE PROPIONATE) 2 sprays  per nostril  every day.  #1 x 1   Entered and Authorized by:   Elyse Jarvis   Signed by:   Elyse Jarvis on 06/27/2010   Method used:   Print then Give to Patient   RxID:   [id number] FUROSEMIDE 20 MG TABS (FUROSEMIDE) take 1 tablet by mouth daily  #30 x 1   Entered and Authorized by:   Elyse Jarvis   Signed by:   Elyse Jarvis on 06/27/2010   Method used:   Print then Give to Patient   RxID:   [id number]    Orders Added: 1)  T-Basic Metabolic Panel [[id number]]    Process Orders Check Orders Results:     [facility]: ABN not required for this insurance Tests Sent for requisitioning (June 29, 2010 8:11 PM):     06/27/2010: [facility] -- T-Basic Metabolic Panel [id number] (signed)

## 2010-09-12 NOTE — Assessment & Plan Note (Signed)
Summary: CHECKUP/SB.   Vital Signs:  Patient profile:   65 year old female Height:      65 inches (165.10 cm) Weight:      111.9 pounds (50.86 kg) BMI:     18.69 Temp:     98.2 degrees F (36.78 degrees C) oral Pulse rate:   110 / minute BP sitting:   118 / 84  (left arm) Cuff size:   regular  Vitals Entered By: Cynda Familia Duncan Dull) (July 23, 2010 3:35 PM) CC: f/u "sodium", f/u right foot pain/swelling, was suppose to be seen by cardiology, but pt canceled the appt, not sleeping well Is Patient Diabetic? No Pain Assessment Patient in pain? yes     Location: right foot/leg Intensity: 8 Type: sharp Onset of pain  2wks Nutritional Status BMI of < 19 = underweight  Have you ever been in a relationship where you felt threatened, hurt or afraid?Unable to ask  Domestic Violence Intervention unable to ask  Does patient need assistance? Functional Status Self care Ambulation Impaired:Risk for fall Comments cane   Primary Care Provider:  Joaquin Courts  MD  CC:  f/u "sodium", f/u right foot pain/swelling, was suppose to be seen by cardiology, but pt canceled the appt, and not sleeping well.  History of Present Illness: 65 yo  woman with end stage sarcoidosis, COPD, HTN presents today for follow up.  She was recently admitted to the hospital for acute COPD exacerbation in 06/2010.  She received IV steroids and abx and improved so she was discharged home after several days in the hospital.  She states that her phlegm is back and it is beige color x 2 weeks and she has a hard time bringing it up.  She denies any wheezing or chestpain.  Has SOB when she walks about 1 block.  In the hospital, she had some chestpain and elevated Troponin so she had normal echo done by Dr. Milas Kocher.  He recommended myoview as outpatient.  Patient was evaluated by Dr. Clifton James on 11/18 and she was supposed to have the nuclear scan on 12/6; however, that was the day when she dislocated her right hip.  She  was seen in the ER and Dr. Montez Morita took her to OR to do internal rotation/fixation.  She was sent home from the ED on the same day.   Her right ankle and leg has been swelling and tender x 3-4days and has been taking her fluid pills.  She quit smoking since November.   Preventive Screening-Counseling & Management  Alcohol-Tobacco     Alcohol drinks/day: 1     Alcohol type: liquor on w/e's     Smoking Status: quit     Smoking Cessation Counseling: yes     Packs/Day: 2-3 cigs per day     Year Started: age 9 yrs old     Year Quit: 05/2010  Allergies: 1)  ! Pcn 2)  ! Aspirin (Aspirin) 3)  ! Lisinopril (Lisinopril) 4)  ! Diovan (Valsartan)  Review of Systems       cough  Physical Exam  General:  alert, well-developed, well-nourished, and well-hydrated.   Lungs:  normal respiratory effort, no intercostal retractions, no accessory muscle use, and normal breath sounds.  + mild rhonchi and wheezes Heart:  tachycardic, no murmur, no gallop or rub, no JVD Abdomen:  soft, non-tender, normal bowel sounds, no distention, no masses, and no rebound tenderness.   Extremities:  right ankle swelling with tenderness: +2 non-pitting edema Neurologic:  alert &  oriented X3, cranial nerves II-XII intact, strength normal in all extremities, and sensation intact to light touch.     Impression & Recommendations:  Problem # 1:  CHRONIC OBSTRUCTIVE PULMONARY DISEASE (ICD-496) Assessment New She is taking Guaifenesin to help break up the congestion but she still has SOB.  We will try home nebulizer to see if she improves because she improved.  WIll prescribe Doxycycline 100mg  by mouth two times a day x 10 days since she reports increase in phlegm and beige in color.  I will see her back in 3 days and if she is still not improving on the neb and abx, she may need a trial of prednisone tapering.  Will fax Neb and machine prescription to [facility]  Her updated medication list for this problem  includes:    Advair Diskus 500-50 Mcg/dose Aepb (Fluticasone-salmeterol) .Marland Kitchen... 1 inhalation two times a day    Ventolin Hfa 108 (90 Base) Mcg/act Aers (Albuterol sulfate) .Marland Kitchen... Take one to two puffs every 8 hours as needed for shortness of breath    Spiriva Handihaler 18 Mcg Caps (Tiotropium bromide monohydrate) ..... One puff in handihaler daily    Albuterol Sulfate (2.5 Mg/20ml) 0.083% Nebu (Albuterol sulfate) ..... Use breathing treament 4 x per day as needed for shortness of breath  Problem # 2:  HYPERTENSION (ICD-401.9) BP 118/84.  Well-controlled. Will continue current medications  Her updated medication list for this problem includes:    Amlodipine Besylate 10 Mg Tabs (Amlodipine besylate) .Marland Kitchen... Take 1 tablet by mouth  once daily    Furosemide 20 Mg Tabs (Furosemide) .Marland Kitchen... Take 1 tablet by mouth daily  Problem # 3:  ANKLE EDEMA (ICD-782.3) s/p right hip dislocation and internal fixation.  Tender on right ankle , +2 nonpitting edema.  Most likely secondary to her recent injury and surgery.  Will continue to observe for now.  I will see her back this Friday 07/26/10.  Her updated medication list for this problem includes:    Furosemide 20 Mg Tabs (Furosemide) .Marland Kitchen... Take 1 tablet by mouth daily  Complete Medication List: 1)  Advair Diskus 500-50 Mcg/dose Aepb (Fluticasone-salmeterol) .Marland Kitchen.. 1 inhalation two times a day 2)  Ventolin Hfa 108 (90 Base) Mcg/act Aers (Albuterol sulfate) .... Take one to two puffs every 8 hours as needed for shortness of breath 3)  Amlodipine Besylate 10 Mg Tabs (Amlodipine besylate) .... Take 1 tablet by mouth  once daily 4)  Multivitamins Caps (Multiple vitamin) .... Take 1 tablet by mouth once a day 5)  Spiriva Handihaler 18 Mcg Caps (Tiotropium bromide monohydrate) .... One puff in handihaler daily 6)  Guaifenesin Dac 30-10-100 Mg/39ml Soln (Pseudoephedrine-codeine-gg) .... Take 1 teaspoon every 4-6 hours as needed for cough 7)  Furosemide 20 Mg Tabs  (Furosemide) .... Take 1 tablet by mouth daily 8)  Albuterol Sulfate (2.5 Mg/60ml) 0.083% Nebu (Albuterol sulfate) .... Use breathing treament 4 x per day as needed for shortness of breath 9)  Doxycycline Hyclate 100 Mg Tabs (Doxycycline hyclate) .... Take one tablet by mouth two times a day with food x 10 days  Patient Instructions: 1)  Home nebulizer: albuterol 2.5% , use 4 times per day as needed for shortness of breath 2)  Start taking Doxycycline 100mg  by mouth two times a day x 10 days 3)  Follow up with Dr. Anselm Jungling on Friday 07/26/2010, please call for an appointment 4)  Continue all your medications as prescribed Prescriptions: DOXYCYCLINE HYCLATE 100 MG TABS (DOXYCYCLINE HYCLATE) take  one tablet by mouth two times a day with food x 10 days  #20 x 0   Entered and Authorized by:   Rosana Berger MD   Signed by:   Rosana Berger MD on 07/23/2010   Method used:   Electronically to        [facility] [id number] (retail)       [address]       [place], [place]  [address]       Ph: [phone]       Fax: [phone]   RxID:   [id number] ADVAIR DISKUS 500-50 MCG/DOSE AEPB (FLUTICASONE-SALMETEROL) 1 inhalation two times a day  #1 x 11   Entered and Authorized by:   Rosana Berger MD   Signed by:   Rosana Berger MD on 07/23/2010   Method used:   Electronically to        [facility] [id number] (retail)       [address]       [place], [place]  [address]       Ph: [phone]       Fax: [phone]   RxID:   [id number]    Orders Added: 1)  Est. Patient Level IV [06301]     Prevention & Chronic Care Immunizations   Influenza vaccine: Fluvax 3+  (06/06/2009)    Tetanus booster: 08/11/2005: Historical    Pneumococcal vaccine: Historical  (08/11/2005)    H. zoster vaccine: Not documented  Colorectal Screening   Hemoccult: Not documented   Hemoccult action/deferral: Deferred  (06/06/2009)    Colonoscopy: Location:  [facility].    (12/15/2000)   Colonoscopy action/deferral: GI referral  (04/23/2009)  Other Screening   Pap smear: Specimen Adequacy: Satisfactory for evaluation.   Interpretation/Result:Negative for intraepithelial Lesion or Malignancy.     (01/18/2009)   Pap smear due: 01/19/2012    Mammogram: No specific mammographic evidence of malignancy.  Assessment: BIRADS 1.   (02/20/2009)   Mammogram due: 02/20/2010    DXA bone density scan: Not documented   DXA bone density action/deferral: Ordered  (02/20/2009)   Smoking status: quit  (07/23/2010)  Lipids   Total Cholesterol: 224  (04/23/2009)   Lipid panel action/deferral: Lipid Panel ordered   LDL: 120  (04/23/2009)   LDL Direct: Not documented   HDL: 88  (04/23/2009)   Triglycerides: 79  (04/23/2009)  Hypertension   Last Blood Pressure: 118 / 84  (07/23/2010)   Serum creatinine: 0.68  (06/27/2010)   Serum potassium 4.1  (06/27/2010)  Self-Management Support :   Personal Goals (by the next clinic visit) :      Personal blood pressure goal: 130/80  (06/19/2010)   Patient will work on the following items until the next clinic visit to reach self-care goals:     Medications and monitoring: take my medicines every day  (07/23/2010)     Eating: eat more vegetables, use fresh or frozen vegetables, eat foods that are low in salt, eat fruit for snacks and desserts, limit or avoid alcohol  (06/19/2010)     Activity: take a 30 minute walk every day  (06/19/2010)    Hypertension self-management support: Written self-care plan  (07/23/2010)   Hypertension self-care plan printed.

## 2010-09-12 NOTE — Medication Information (Signed)
Summary: ALBUTEROL/NEBULIZER MACHINE  ALBUTEROL/NEBULIZER MACHINE   Imported By: Margie Billet 07/30/2010 10:16:47  _____________________________________________________________________  External Attachment:    Type:   Image     Comment:   External Document

## 2010-09-24 ENCOUNTER — Other Ambulatory Visit: Payer: Self-pay | Admitting: *Deleted

## 2010-09-24 MED ORDER — FUROSEMIDE 20 MG PO TABS: 20.0000 mg | ORAL_TABLET | Freq: Every day | ORAL | Status: DC

## 2010-09-25 NOTE — Telephone Encounter (Signed)
Refill faxed to pharmacy.

## 2010-10-21 LAB — BASIC METABOLIC PANEL
BUN: 8 mg/dL (ref 6–23)
Calcium: 9.2 mg/dL (ref 8.4–10.5)
Creatinine, Ser: 0.67 mg/dL (ref 0.4–1.2)
GFR calc non Af Amer: >60 mL/min (ref >60)
Glucose, Bld: 93 mg/dL (ref 70–99)

## 2010-10-21 LAB — DIFFERENTIAL
Basophils Absolute: 0 K/uL (ref 0.0–0.1)
Basophils Relative: 0 % (ref 0–1)
Eosinophils Absolute: 0 K/uL (ref 0.0–0.7)
Eosinophils Relative: 0 % (ref 0–5)
Lymphocytes Relative: 9 % — ABNORMAL LOW (ref 12–46)
Lymphs Abs: 0.6 K/uL — ABNORMAL LOW (ref 0.7–4.0)
Monocytes Absolute: 0.7 K/uL (ref 0.1–1.0)
Monocytes Relative: 10 % (ref 3–12)
Neutro Abs: 5.4 K/uL (ref 1.7–7.7)
Neutrophils Relative %: 81 % — ABNORMAL HIGH (ref 43–77)

## 2010-10-21 LAB — BASIC METABOLIC PANEL WITH GFR
CO2: 28 meq/L (ref 19–32)
Chloride: 100 meq/L (ref 96–112)
GFR calc Af Amer: >60 mL/min (ref >60)
Potassium: 3.6 meq/L (ref 3.5–5.1)
Sodium: 138 meq/L (ref 135–145)

## 2010-10-21 LAB — CBC
HCT: 32.4 % — ABNORMAL LOW (ref 36.0–46.0)
Hemoglobin: 11.1 g/dL — ABNORMAL LOW (ref 12.0–15.0)
MCH: 27.7 pg (ref 26.0–34.0)
MCHC: 34.3 g/dL (ref 30.0–36.0)
MCV: 80.8 fL (ref 78.0–100.0)
Platelets: 408 K/uL — ABNORMAL HIGH (ref 150–400)
RBC: 4.01 MIL/uL (ref 3.87–5.11)
RDW: 15.3 % (ref 11.5–15.5)
WBC: 6.8 K/uL (ref 4.0–10.5)

## 2010-10-22 LAB — BASIC METABOLIC PANEL
BUN: 10 mg/dL (ref 6–23)
BUN: 17 mg/dL (ref 6–23)
BUN: 7 mg/dL (ref 6–23)
CO2: 31 mEq/L (ref 19–32)
Calcium: 8.9 mg/dL (ref 8.4–10.5)
Calcium: 8.9 mg/dL (ref 8.4–10.5)
Chloride: 87 mEq/L — ABNORMAL LOW (ref 96–112)
Chloride: 91 mEq/L — ABNORMAL LOW (ref 96–112)
Chloride: 93 mEq/L — ABNORMAL LOW (ref 96–112)
Creatinine, Ser: 0.64 mg/dL (ref 0.4–1.2)
Creatinine, Ser: 0.77 mg/dL (ref 0.4–1.2)
Creatinine, Ser: 0.78 mg/dL (ref 0.4–1.2)
Creatinine, Ser: 0.82 mg/dL (ref 0.4–1.2)
GFR calc Af Amer: >60 mL/min (ref >60)
GFR calc Af Amer: >60 mL/min (ref >60)
GFR calc Af Amer: >60 mL/min (ref >60)
GFR calc non Af Amer: >60 mL/min (ref >60)
GFR calc non Af Amer: >60 mL/min (ref >60)
GFR calc non Af Amer: >60 mL/min (ref >60)
GFR calc non Af Amer: >60 mL/min (ref >60)
GFR calc non Af Amer: >60 mL/min (ref >60)
GFR calc non Af Amer: >60 mL/min (ref >60)
Glucose, Bld: 157 mg/dL — ABNORMAL HIGH (ref 70–99)
Glucose, Bld: 188 mg/dL — ABNORMAL HIGH (ref 70–99)
Potassium: 2.7 mEq/L — CL (ref 3.5–5.1)
Potassium: 4.1 mEq/L (ref 3.5–5.1)
Potassium: 4.2 mEq/L (ref 3.5–5.1)
Potassium: 4.5 mEq/L (ref 3.5–5.1)
Sodium: 127 mEq/L — ABNORMAL LOW (ref 135–145)
Sodium: 128 mEq/L — ABNORMAL LOW (ref 135–145)
Sodium: 137 mEq/L (ref 135–145)

## 2010-10-22 LAB — BASIC METABOLIC PANEL WITH GFR
BUN: 9 mg/dL (ref 6–23)
CO2: 32 meq/L (ref 19–32)
Calcium: 9.7 mg/dL (ref 8.4–10.5)
Chloride: 91 meq/L — ABNORMAL LOW (ref 96–112)
Chloride: 94 meq/L — ABNORMAL LOW (ref 96–112)
Creatinine, Ser: 0.7 mg/dL (ref 0.4–1.2)
GFR calc Af Amer: >60 mL/min (ref >60)
GFR calc Af Amer: >60 mL/min (ref >60)
Potassium: 3.6 meq/L (ref 3.5–5.1)
Sodium: 132 meq/L — ABNORMAL LOW (ref 135–145)

## 2010-10-22 LAB — LIPID PANEL
Cholesterol: 168 mg/dL (ref 0–200)
HDL: 104 mg/dL (ref >39)
LDL Cholesterol: 58 mg/dL (ref 0–99)
Total CHOL/HDL Ratio: 1.6 ratio
Triglycerides: 29 mg/dL (ref <150)
VLDL: 6 mg/dL (ref 0–40)

## 2010-10-22 LAB — CBC
HCT: 33.3 % — ABNORMAL LOW (ref 36.0–46.0)
HCT: 34.3 % — ABNORMAL LOW (ref 36.0–46.0)
HCT: 36.2 % (ref 36.0–46.0)
Hemoglobin: 11.6 g/dL — ABNORMAL LOW (ref 12.0–15.0)
Hemoglobin: 11.8 g/dL — ABNORMAL LOW (ref 12.0–15.0)
Hemoglobin: 11.9 g/dL — ABNORMAL LOW (ref 12.0–15.0)
Hemoglobin: 12.8 g/dL (ref 12.0–15.0)
MCH: 27.1 pg (ref 26.0–34.0)
MCH: 27.6 pg (ref 26.0–34.0)
MCH: 27.8 pg (ref 26.0–34.0)
MCHC: 34.6 g/dL (ref 30.0–36.0)
MCHC: 34.8 g/dL (ref 30.0–36.0)
MCHC: 35.4 g/dL (ref 30.0–36.0)
MCV: 78.7 fL (ref 78.0–100.0)
MCV: 79.1 fL (ref 78.0–100.0)
MCV: 79.3 fL (ref 78.0–100.0)
MCV: 79.4 fL (ref 78.0–100.0)
MCV: 79.5 fL (ref 78.0–100.0)
Platelets: 249 10*3/uL (ref 150–400)
Platelets: 281 K/uL (ref 150–400)
Platelets: 316 K/uL (ref 150–400)
Platelets: 319 10*3/uL (ref 150–400)
Platelets: 330 10*3/uL (ref 150–400)
RBC: 4.2 MIL/uL (ref 3.87–5.11)
RBC: 4.32 MIL/uL (ref 3.87–5.11)
RBC: 4.38 MIL/uL (ref 3.87–5.11)
RBC: 4.39 MIL/uL (ref 3.87–5.11)
RBC: 4.6 MIL/uL (ref 3.87–5.11)
RDW: 15.5 % (ref 11.5–15.5)
RDW: 15.5 % (ref 11.5–15.5)
RDW: 15.6 % — ABNORMAL HIGH (ref 11.5–15.5)
RDW: 15.7 % — ABNORMAL HIGH (ref 11.5–15.5)
RDW: 15.7 % — ABNORMAL HIGH (ref 11.5–15.5)
WBC: 10.9 10*3/uL — ABNORMAL HIGH (ref 4.0–10.5)
WBC: 12.7 K/uL — ABNORMAL HIGH (ref 4.0–10.5)
WBC: 12.8 10*3/uL — ABNORMAL HIGH (ref 4.0–10.5)
WBC: 16.4 K/uL — ABNORMAL HIGH (ref 4.0–10.5)
WBC: 7.9 10*3/uL (ref 4.0–10.5)

## 2010-10-22 LAB — URINE MICROSCOPIC-ADD ON

## 2010-10-22 LAB — TROPONIN I: Troponin I: 0.12 ng/mL — ABNORMAL HIGH (ref 0.00–0.06)

## 2010-10-22 LAB — HEMOGLOBIN A1C
Hgb A1c MFr Bld: 5.6 % (ref <5.7)
Hgb A1c MFr Bld: 5.8 % — ABNORMAL HIGH (ref <5.7)
Mean Plasma Glucose: 114 mg/dL (ref <117)
Mean Plasma Glucose: 120 mg/dL — ABNORMAL HIGH (ref <117)

## 2010-10-22 LAB — CK TOTAL AND CKMB (NOT AT ARMC)
CK, MB: 2 ng/mL (ref 0.3–4.0)
Relative Index: INVALID (ref 0.0–2.5)
Total CK: 51 U/L (ref 7–177)

## 2010-10-22 LAB — CARDIAC PANEL(CRET KIN+CKTOT+MB+TROPI)
CK, MB: 1.7 ng/mL (ref 0.3–4.0)
CK, MB: 1.7 ng/mL (ref 0.3–4.0)
CK, MB: 2.1 ng/mL (ref 0.3–4.0)
Relative Index: INVALID (ref 0.0–2.5)
Relative Index: INVALID (ref 0.0–2.5)
Relative Index: INVALID (ref 0.0–2.5)
Total CK: 33 U/L (ref 7–177)
Total CK: 44 U/L (ref 7–177)
Total CK: 51 U/L (ref 7–177)
Troponin I: 0.06 ng/mL (ref 0.00–0.06)
Troponin I: 0.07 ng/mL — ABNORMAL HIGH (ref 0.00–0.06)
Troponin I: 0.11 ng/mL — ABNORMAL HIGH (ref 0.00–0.06)

## 2010-10-22 LAB — URINALYSIS, ROUTINE W REFLEX MICROSCOPIC
Glucose, UA: 500 mg/dL — AB
Hgb urine dipstick: NEGATIVE
Ketones, ur: 15 mg/dL — AB
pH: 6 (ref 5.0–8.0)

## 2010-10-22 LAB — DIFFERENTIAL
Basophils Absolute: 0 10*3/uL (ref 0.0–0.1)
Eosinophils Absolute: 0 10*3/uL (ref 0.0–0.7)
Eosinophils Relative: 0 % (ref 0–5)

## 2010-10-22 LAB — D-DIMER, QUANTITATIVE: D-Dimer, Quant: 3.83 ug{FEU}/mL — ABNORMAL HIGH (ref 0.00–0.48)

## 2010-10-22 LAB — TSH
TSH: 0.206 u[IU]/mL — ABNORMAL LOW (ref 0.350–4.500)
TSH: 0.234 u[IU]/mL — ABNORMAL LOW (ref 0.350–4.500)

## 2010-10-22 LAB — OSMOLALITY: Osmolality: 265 mOsm/kg — ABNORMAL LOW (ref 275–300)

## 2010-10-23 LAB — BASIC METABOLIC PANEL
CO2: 28 mEq/L (ref 19–32)
Calcium: 9 mg/dL (ref 8.4–10.5)
Chloride: 90 mEq/L — ABNORMAL LOW (ref 96–112)
Creatinine, Ser: 0.69 mg/dL (ref 0.4–1.2)
Glucose, Bld: 73 mg/dL (ref 70–99)
Potassium: 3.4 mEq/L — ABNORMAL LOW (ref 3.5–5.1)
Sodium: 128 mEq/L — ABNORMAL LOW (ref 135–145)

## 2010-10-23 LAB — CBC
HCT: 35.5 % — ABNORMAL LOW (ref 36.0–46.0)
Hemoglobin: 12.6 g/dL (ref 12.0–15.0)
MCH: 27.8 pg (ref 26.0–34.0)
MCHC: 35.5 g/dL (ref 30.0–36.0)
MCV: 78.4 fL (ref 78.0–100.0)
Platelets: 217 10*3/uL (ref 150–400)
RBC: 4.53 MIL/uL (ref 3.87–5.11)
RDW: 15.3 % (ref 11.5–15.5)
WBC: 3.9 10*3/uL — ABNORMAL LOW (ref 4.0–10.5)

## 2010-10-23 LAB — DIFFERENTIAL
Basophils Absolute: 0 K/uL (ref 0.0–0.1)
Basophils Relative: 0 % (ref 0–1)
Eosinophils Absolute: 0.1 10*3/uL (ref 0.0–0.7)
Eosinophils Relative: 2 % (ref 0–5)
Lymphocytes Relative: 24 % (ref 12–46)
Lymphs Abs: 1 10*3/uL (ref 0.7–4.0)
Monocytes Absolute: 0.4 K/uL (ref 0.1–1.0)
Monocytes Relative: 9 % (ref 3–12)
Neutro Abs: 2.5 K/uL (ref 1.7–7.7)
Neutrophils Relative %: 64 % (ref 43–77)

## 2010-10-23 LAB — BASIC METABOLIC PANEL WITH GFR
BUN: 9 mg/dL (ref 6–23)
GFR calc Af Amer: >60 mL/min (ref >60)
GFR calc non Af Amer: >60 mL/min (ref >60)

## 2010-11-01 ENCOUNTER — Encounter: Payer: Self-pay | Admitting: Internal Medicine

## 2010-11-01 DIAGNOSIS — M1990 Unspecified osteoarthritis, unspecified site: Secondary | ICD-10-CM

## 2010-11-01 DIAGNOSIS — F419 Anxiety disorder, unspecified: Secondary | ICD-10-CM | POA: Insufficient documentation

## 2010-11-01 DIAGNOSIS — M160 Bilateral primary osteoarthritis of hip: Secondary | ICD-10-CM | POA: Insufficient documentation

## 2010-11-01 DIAGNOSIS — K648 Other hemorrhoids: Secondary | ICD-10-CM

## 2010-11-26 LAB — DIFFERENTIAL
Basophils Relative: 3 % — ABNORMAL HIGH (ref 0–1)
Lymphocytes Relative: 33 % (ref 12–46)
Lymphs Abs: 0.7 10*3/uL (ref 0.7–4.0)
Monocytes Absolute: 0.3 10*3/uL (ref 0.1–1.0)
Monocytes Relative: 12 % (ref 3–12)
Neutro Abs: 1.1 10*3/uL — ABNORMAL LOW (ref 1.7–7.7)

## 2010-11-26 LAB — CBC
Hemoglobin: 11.9 g/dL — ABNORMAL LOW (ref 12.0–15.0)
MCHC: 34.5 g/dL (ref 30.0–36.0)
RBC: 4.08 MIL/uL (ref 3.87–5.11)
WBC: 2.2 10*3/uL — ABNORMAL LOW (ref 4.0–10.5)

## 2010-11-26 LAB — URINALYSIS, ROUTINE W REFLEX MICROSCOPIC
Bilirubin Urine: NEGATIVE
Glucose, UA: NEGATIVE mg/dL
Hgb urine dipstick: NEGATIVE
Specific Gravity, Urine: 1.025 (ref 1.005–1.030)
pH: 5.5 (ref 5.0–8.0)

## 2010-11-26 LAB — BASIC METABOLIC PANEL
CO2: 28 mEq/L (ref 19–32)
Calcium: 8.5 mg/dL (ref 8.4–10.5)
GFR calc Af Amer: >60 mL/min (ref >60)
Sodium: 138 mEq/L (ref 135–145)

## 2010-12-16 ENCOUNTER — Observation Stay (HOSPITAL_COMMUNITY)
Admission: EM | Admit: 2010-12-16 | Discharge: 2010-12-16 | Disposition: A | Discharge status: Home / Self Care | Payer: Medicaid Other | Attending: Orthopedic Surgery | Admitting: Orthopedic Surgery

## 2010-12-16 ENCOUNTER — Emergency Department (HOSPITAL_COMMUNITY): Payer: Medicaid Other

## 2010-12-16 DIAGNOSIS — I10 Essential (primary) hypertension: Secondary | ICD-10-CM | POA: Insufficient documentation

## 2010-12-16 DIAGNOSIS — X500XXA Overexertion from strenuous movement or load, initial encounter: Secondary | ICD-10-CM | POA: Insufficient documentation

## 2010-12-16 DIAGNOSIS — Y92009 Unspecified place in unspecified non-institutional (private) residence as the place of occurrence of the external cause: Secondary | ICD-10-CM | POA: Insufficient documentation

## 2010-12-16 DIAGNOSIS — T84029A Dislocation of unspecified internal joint prosthesis, initial encounter: Principal | ICD-10-CM | POA: Insufficient documentation

## 2010-12-16 DIAGNOSIS — I252 Old myocardial infarction: Secondary | ICD-10-CM | POA: Insufficient documentation

## 2010-12-16 DIAGNOSIS — J449 Chronic obstructive pulmonary disease, unspecified: Secondary | ICD-10-CM | POA: Insufficient documentation

## 2010-12-16 DIAGNOSIS — Z96649 Presence of unspecified artificial hip joint: Secondary | ICD-10-CM | POA: Insufficient documentation

## 2010-12-16 DIAGNOSIS — J4489 Other specified chronic obstructive pulmonary disease: Secondary | ICD-10-CM | POA: Insufficient documentation

## 2010-12-16 LAB — DIFFERENTIAL
Lymphocytes Relative: 21 % (ref 12–46)
Lymphs Abs: 0.7 10*3/uL (ref 0.7–4.0)
Monocytes Absolute: 0.3 10*3/uL (ref 0.1–1.0)
Monocytes Relative: 8 % (ref 3–12)
Neutro Abs: 2.2 10*3/uL (ref 1.7–7.7)
Neutrophils Relative %: 69 % (ref 43–77)

## 2010-12-16 LAB — URINALYSIS, ROUTINE W REFLEX MICROSCOPIC
Bilirubin Urine: NEGATIVE
Glucose, UA: NEGATIVE mg/dL
Hgb urine dipstick: NEGATIVE
Specific Gravity, Urine: 1.012 (ref 1.005–1.030)
Urobilinogen, UA: 0.2 mg/dL (ref 0.0–1.0)

## 2010-12-16 LAB — CBC
HCT: 33.7 % — ABNORMAL LOW (ref 36.0–46.0)
Hemoglobin: 11.8 g/dL — ABNORMAL LOW (ref 12.0–15.0)
MCH: 28.3 pg (ref 26.0–34.0)
MCHC: 35 g/dL (ref 30.0–36.0)
RBC: 4.17 MIL/uL (ref 3.87–5.11)

## 2010-12-16 LAB — BASIC METABOLIC PANEL
CO2: 24 mEq/L (ref 19–32)
Calcium: 9.1 mg/dL (ref 8.4–10.5)
Chloride: 100 mEq/L (ref 96–112)
Creatinine, Ser: 0.66 mg/dL (ref 0.4–1.2)
Glucose, Bld: 65 mg/dL — ABNORMAL LOW (ref 70–99)

## 2010-12-16 LAB — URINE MICROSCOPIC-ADD ON

## 2010-12-17 ENCOUNTER — Other Ambulatory Visit: Payer: Self-pay | Admitting: *Deleted

## 2010-12-17 LAB — URINE CULTURE: Culture  Setup Time: 201205071829

## 2010-12-17 MED ORDER — AMLODIPINE BESYLATE 10 MG PO TABS: 10.0000 mg | ORAL_TABLET | Freq: Every day | ORAL | Status: DC

## 2010-12-17 NOTE — Telephone Encounter (Signed)
As i was saying she is transferred to [facility]. For scheduling.

## 2010-12-17 NOTE — Telephone Encounter (Signed)
Pt's spouse calls and states pt was in ED 5/7 and needs refills on BP meds, he states she was in ED for hip pain and her BP was "up real high" the whole time. She ask for refills of norvasc and hctz or triam/ hctz, pt is made aware that the hctz has been discontinued for her and she has been prescribed lasix, she is somewhat confused by this. An appt is suggested and she is

## 2010-12-18 ENCOUNTER — Ambulatory Visit (INDEPENDENT_AMBULATORY_CARE_PROVIDER_SITE_OTHER): Payer: Medicare Other | Admitting: Internal Medicine

## 2010-12-18 ENCOUNTER — Encounter: Payer: Self-pay | Admitting: Internal Medicine

## 2010-12-18 VITALS — BP 135/75 | HR 105 | Temp 98.4°F | Ht 65.0 in | Wt 113.9 lb

## 2010-12-18 DIAGNOSIS — R000 Tachycardia, unspecified: Secondary | ICD-10-CM

## 2010-12-18 LAB — CBC
HCT: 35 % — ABNORMAL LOW (ref 36.0–46.0)
Hemoglobin: 11.8 g/dL — ABNORMAL LOW (ref 12.0–15.0)
MCH: 28.4 pg (ref 26.0–34.0)
MCV: 84.1 fL (ref 78.0–100.0)
Platelets: 216 10*3/uL (ref 150–400)
RBC: 4.16 MIL/uL (ref 3.87–5.11)
WBC: 3 10*3/uL — ABNORMAL LOW (ref 4.0–10.5)

## 2010-12-18 MED ORDER — CHLORTHALIDONE 25 MG PO TABS: 25.0000 mg | ORAL_TABLET | Freq: Every day | ORAL | Status: DC

## 2010-12-18 MED ORDER — METOPROLOL TARTRATE 25 MG PO TABS: 25.0000 mg | ORAL_TABLET | Freq: Two times a day (BID) | ORAL | Status: DC

## 2010-12-24 NOTE — H&P (Signed)
NAMEJAYDALEE, BARDWELL NO.:  [id number]   MEDICAL RECORD NO.:  [id number]          PATIENT TYPE:  OBV   LOCATION:  2550                         FACILITY:  [facility]   PHYSICIAN:  Myrtie Neither, MD      DATE OF BIRTH:  05-02-1946   DATE OF ADMISSION:  10/01/2008  DATE OF DISCHARGE:                              HISTORY & PHYSICAL   CHIEF COMPLAINT:  Dislocated right hip.   HISTORY OF PRESENT ILLNESS:  This is a 65 year old female who had  undergone a right total hip revision, last seen approximately 5 years  ago who states that up until last night she had no problem with the  right hip, but states she went to sit down and crossed her legs and the  right hip dislocated.  The patient came to [facility] Emergency Room.   PAST MEDICAL HISTORY:  COPD, hypertension, myocardial infarction, right  total hip arthroplasty, right total hip revision, and hip dislocation.   SOCIAL HISTORY:  Drinks alcohol occasionally.  History of smoking, but  currently not smoking.   FAMILY HISTORY:  Noncontributory.   REVIEW OF SYSTEMS:  COPD.   MEDICATIONS:  1. Hydrochlorothiazide 25 mg daily.  2. Trazodone 10 mg at bedtime.  3. Albuterol p.r.n.   PHYSICAL EXAMINATION:  VITAL SIGNS:  Temperature 97.6, blood pressure  142/84, pulse 86, respirations 16, and pulse oximetry 97%.  HEENT:  Head, normocephalic.  Eyes, conjunctivae clear.  NECK:  Supple.  CHEST:  Clear.  CARDIAC:  S1, S2 regular.  EXTREMITIES:  Old surgical scar, right hip.  Right hip tender,  shortened, and internally rotated.  NEUROVASCULAR:  Neurovascular status is intact.   IMAGING:  X-rays revealed dislocation of right total hip.   IMPRESSION:  Dislocation, right total hip; chronic obstructive pulmonary  disease; and hypertension.   PLAN:  Close manipulated reduction and use of knee immobilizer.      Myrtie Neither, MD  Electronically Signed     AC/MEDQ  D:  10/01/2008  T:  10/01/2008  Job:  [id number]

## 2010-12-24 NOTE — Op Note (Signed)
NAMEKEIMYA, BRIDDELL NO.:  [id number]   MEDICAL RECORD NO.:  [id number]          PATIENT TYPE:  OBV   LOCATION:  2550                         FACILITY:  [facility]   PHYSICIAN:  Myrtie Neither, MD      DATE OF BIRTH:  01-04-1946   DATE OF PROCEDURE:  10/01/2008  DATE OF DISCHARGE:  10/01/2008                               OPERATIVE REPORT   PREOPERATIVE DIAGNOSIS:  Dislocation, right total hip.   POSTOPERATIVE DIAGNOSIS:  Dislocation, right total hip.   ANESTHESIA:  General.   PROCEDURE:  Closed manipulative reduction, right total hip.   DETAILS OF THE PROCEDURE:  The patient was taken to the operating room.  After given adequate preop medications, given general anesthesia.  General manipulative reduction with traction and countertraction applied  to the right hip, brought the hip down to a reduced position.  __________ with C-arm.  Knee immobilizer was placed on the right side,  right leg.  The patient tolerated the procedure well and went to  recovery room in stable and satisfactory condition.   The patient is being discharged to continue knee immobilizer.  We will  see him back in the office in 2 weeks.  The patient has been discharged  in stable and satisfactory condition.      Myrtie Neither, MD  Electronically Signed     AC/MEDQ  D:  10/01/2008  T:  10/01/2008  Job:  [id number]

## 2010-12-26 NOTE — Discharge Summary (Signed)
  NAMEKANITRA, PURIFOY NO.:  [id number]  MEDICAL RECORD NO.:  [id number]           PATIENT TYPE:  LOCATION:                                 FACILITY:  PHYSICIAN:  Myrtie Neither, MD      DATE OF BIRTH:  03-Jun-1946  DATE OF ADMISSION:  05/16/2010 DATE OF DISCHARGE:  05/16/2010                              DISCHARGE SUMMARY   ADMITTING DIAGNOSIS:  Recurrent dislocation right total hip.  DISCHARGE DIAGNOSIS:  Recurrent dislocation right total hip.  COMPLICATIONS:  None.  INFECTIONS:  None.  OPERATION:  Closed manipulated reduction right dislocated hip on May 16, 2010.  PERTINENT HISTORY:  This is a 65 year old with a recurrent dislocation of her right total hip.  Last dislocation was 2 months ago from a fall. Present dislocation the patient was making it turned and felt a pop in the right hip.  PERTINENT PHYSICAL:  Right hip shortened, tender, anterior and laterally.  Neurovascular status intact.  X-rays revealed dislocation right total hip.  HOSPITAL COURSE:  The patient had underwent preop laboratory CBC, EKGs, CMET, UA, chest x-ray, labs were stable and the patient undergo surgery. The patient underwent closed manipulated reduction and tolerated procedure quite well, placed in a knee immobilizer and was discharged on Percocet 1-2 q.6 p.r.n. for pain, use of walker, weightbearing as tolerated on the right side, and to return to the office in 1 week.  The patient was discharged in stable and satisfactory condition.     Myrtie Neither, MD     AC/MEDQ  D:  11/19/2010  T:  11/20/2010  Job:  [id number]  Electronically Signed by Myrtie Neither MD on 12/26/2010 03:02:16 PM

## 2010-12-26 NOTE — H&P (Signed)
  [name], [name] NO.:  [id number]  MEDICAL RECORD NO.:  [id number]           PATIENT TYPE:  O  LOCATION:  2550                         FACILITY:  [facility]  PHYSICIAN:  Myrtie Neither, MD      DATE OF BIRTH:  02/18/1946  DATE OF ADMISSION:  12/16/2010 DATE OF DISCHARGE:  12/16/2010                             HISTORY & PHYSICAL   CHIEF COMPLAINT:  Dislocated right total hip.  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  The patient states at this time again, she was just sitting and felt the hip pop out.  This occurred yesterday.  The patient came to the emergency room sometime today complaining of pain in the right hip and dislocation.  PAST MEDICAL HISTORY: 1. Right total hip, right total hip revision. 2. Recurrent dislocation, right hip. 3. Sarcoidosis. 4. COPD. 5. History of hypertension. 6. History of myocardial infarction.  ALLERGIES:  ASPIRIN, PENICILLIN.  MEDICATIONS:  Advair Diskus, amlodipine, furosemide, guaifenesin DM, Spiriva with handy inhaler, triamterene/hydrochlorothiazide.  The patient denies drug abuse.  Denies smoking.  Occasional drinker.  FAMILY HISTORY:  Noncontributory.  REVIEW OF SYSTEMS:  Basically he has episodes of shortness of breath from her COPD.  No urinary or bowel symptoms.  No chest pains.  PHYSICAL EXAMINATION:  VITAL SIGNS:  Temperature 98.1, blood pressure 120/86, pulse 116, respirations 20, O2 saturation 98%. HEENT:  Head, normocephalic.  Eyes, conjunctivae and sclerae clear. NECK:  Supple. CHEST:  Some of rhonchi.  No wheezing. CARDIAC:  S1, S2 regular. EXTREMITIES:  Right hip old surgical scar, shortened, tender anterior and laterally with right hip and lower extremity internally rotated and shortened.  X-rays revealed dislocation of right total hip.  IMPRESSION: 1. Dislocation of right total hip. 2. History of hypertension. 3.  History of sarcoidosis. 4. History of chronic obstructive pulmonary disease. 5. History of myocardial infarction.  PLAN:  Closed manipulated reduction.     Myrtie Neither, MD     AC/MEDQ  D:  12/16/2010  T:  12/17/2010  Job:  [id number]  Electronically Signed by Myrtie Neither MD on 12/26/2010 03:06:30 PM

## 2010-12-26 NOTE — Op Note (Signed)
  NAMECYBIL, [name] NO.:  [id number]  MEDICAL RECORD NO.:  [id number]           PATIENT TYPE:  O  LOCATION:  2550                         FACILITY:  [facility]  PHYSICIAN:  Myrtie Neither, MD      DATE OF BIRTH:  03/28/1946  DATE OF PROCEDURE:  12/16/2010 DATE OF DISCHARGE:  12/16/2010                              OPERATIVE REPORT   SURGEON:  Myrtie Neither, MD  PREOPERATIVE DIAGNOSIS:  Dislocation of right total hip, recurrent.  POSTOPERATIVE DIAGNOSIS:  Dislocation of right total hip, recurrent.  ANESTHESIA:  General.  This is a 65 year old with recurrent right total hip dislocation.  The patient was seen in the emergency room.  Several attempts of closed reduction with IV medication was unsuccessful.  The patient was taken to the operating room.  After given adequate preop medications and given general anesthesia, the patient was placed on supine position.  Attempt of reduction failed.  The patient was placed on the lateral position and the hip was reduced.  This was visualized with use of both C-arm.  The patient was placed in knee immobilizer, tolerated the procedure quite well, went to recovery room in stable and satisfactory condition.  The patient is being discharged home with knee immobilizer, use of walker, weightbearing as tolerated, Percocet one q.6 h. p.r.n. for pain and to return to the office in 1 week.  The patient is being discharged in stable and satisfactory condition.     Myrtie Neither, MD     AC/MEDQ  D:  12/16/2010  T:  12/17/2010  Job:  [id number]  Electronically Signed by Myrtie Neither MD on 12/26/2010 03:06:35 PM

## 2010-12-27 NOTE — H&P (Signed)
NAMEVIELKA, [name] NO.:  [id number]   MEDICAL RECORD NO.:  [id number]          PATIENT TYPE:  EMS   LOCATION:  MAJO                         FACILITY:  [facility]   PHYSICIAN:  Shan Levans, M.D. LHCDATE OF BIRTH:  [birth year]   DATE OF ADMISSION:  11/25/2004  DATE OF DISCHARGE:                                HISTORY & PHYSICAL   CHIEF COMPLAINT:  Upper airway swelling, acute respiratory distress.   HISTORY OF PRESENT ILLNESS:  This is a pleasant 64 year old black female who  was seen by Dr. Tawanna Cooler approximately two weeks ago for both routine management  of hypertension and also acute visit for asthmatic bronchitis flare at which  time the patient was started on a daily dose of prednisone and given a  prescription for lisinopril which she started yesterday.  Patient has been  seen in the [facility] office by Dr. Shelle Iron in the past for treatment  of sarcoid which was diagnosed approximately four years ago.  Patient's  history of present illness begins approximately two weeks ago when she had  noted progressive increasing cough, wheeze.  Cough was productive of yellow  sputum.  Reports increased postnasal drip, increased shortness of breath,  and increased nasal congestion.  This was treated originally as previously  mentioned with oral prednisone.  Patient stated that yesterday she had  filled her lisinopril prescription, took her first lisinopril dose yesterday  morning on November 24, 2004.  Noted that over the course of the evening and  into the night she developed extensive swelling in her neck associated with  progressive shortness of breath.  She awoke this morning unable to breathe  or swallow.  She presents to the emergency room with notable upper airway  edema.  Dr. Lazarus Salines has already seen the patient and obtained a CAT scan of  her head and neck demonstrating excessive soft tissue edema.  She will be  admitted to the intensive care for further treatment  and follow-up.  At this  time it is the assessment of Dr. Lazarus Salines that she will not need an adjunct  airway for management of her respiratory distress.   PAST MEDICAL HISTORY:  1. Sarcoid diagnosed approximately four years ago followed by Dr. Marcelyn Bruins at [facility].  2. Questionable chronic obstructive pulmonary disease with asthmatic      bronchitis flares approximately one time a year.  3. History of GI bleed and colitis thought to be associated with aspirin.  4. History of high blood pressure, approximately 20-year history.  5. History of GERD.     SOCIAL HISTORY:  Lives in [place].  She is a smoker about a half pack per  day for 20 years.  Socially she does drink an occasional beer on the  weekend.   FAMILY HISTORY:  Negative for pulmonary disease, but is significant for  diabetes.   MEDICATIONS:  1. Lisinopril.  2. Hydrochlorothiazide.  3. Prednisone 20 mg daily.     ALLERGIES:  ACE INHIBITORS, questionable sensitivity to ASPIRIN.   REVIEW OF SYSTEMS:  As per HPI.   PHYSICAL EXAMINATION:  VITAL SIGNS:  Temperature 98.8, pulse 125,  respirations 18, blood pressure 163/110-133/95, oxygen saturations 94-97% on  room air.  HEENT:  ENT notable for posterior soft palate and anterior neck and facial  edema, positive for strider.  PULMONARY:  Loud pronounced expiratory wheezes throughout.  CARDIAC:  Regular rate and rhythm.  GI:  Soft, nontender.  GU:  Voiding.  NEUROLOGIC:  Grossly intact.   LABORATORIES:  Sodium 127, potassium 3.8, chloride 94, CO2 32, BUN 14,  creatinine 1.2, glucose 81.  Hemoglobin 16.7, hematocrit 49.  CT of neck  demonstrating extensive inflammation/edema in the posterior pharynx/neck to  the upper mediastinum.   IMPRESSION AND PLAN:  Acute ACE inhibitor-induced angioedema with acute  respiratory distress and probable occult gastroesophageal reflux disease  with acute bronchitis/sinusitis.  Plan for this is to discontinue ACE   inhibitors,  administer intravenous Solu-Medrol, p.o. Benadryl, empiric Avelox, empiric  b.i.d. Protonix.  Will admit to the intensive care for close observation of  airway and to deliver p.r.n. bilevel support if needed based on ABG or  clinical data.      PB/MEDQ  D:  11/25/2004  T:  11/25/2004  Job:  [id number]   cc:   Tinnie Gens A. Tawanna Cooler, M.D. [facility]   Marcelyn Bruins, M.D. [facility]   Karol T. Lazarus Salines, M.D.  [address] [place]  [place] [address]  Fax: [phone]

## 2010-12-27 NOTE — Op Note (Signed)
[name], [name] NO.:  [id number]   MEDICAL RECORD NO.:  [id number]          PATIENT TYPE:  OBV   LOCATION:  1844                         FACILITY:  [facility]   PHYSICIAN:  Myrtie Neither, MD      DATE OF BIRTH:  March 25, 1946   DATE OF PROCEDURE:  10/10/2004  DATE OF DISCHARGE:                                 OPERATIVE REPORT   PREOPERATIVE DIAGNOSIS:  Dislocation right total hip.   POSTOPERATIVE DIAGNOSIS:  Dislocation right total hip.   ANESTHESIA:  General.   PROCEDURE:  Closed manipulated reduction right total hip.   DESCRIPTION OF PROCEDURE:  The patient was taken to the operating room and  after given adequate preoperative medications, given general anesthesia and  intubated.  Due to EtOH on breath.  The patient's right hip was manipulated  with countertraction in the supine position.  The patient's hip would not  relocate.  The patient was placed in the right lateral position and with hip  manipulation, the hip reduced.  X-ray of the right hip demonstrates  reduction of the right total hip.  Implant was intact.  The patient  tolerated the procedure quite well and went to the recovery room in stable  and satisfactory condition.  The patient is being discharged home to reapply  her hip abduction brace which her husband states that he has at home.   DISCHARGE MEDICATIONS:  Percocet one to two q.4h p.r.n. for pain and ice  packs.   The patient is to use her walker for ambulation and return to our office in  10 days.  The patient is being discharged in stable and satisfactory  condition.      AC/MEDQ  D:  10/10/2004  T:  10/10/2004  Job:  [id number]

## 2010-12-27 NOTE — Discharge Summary (Signed)
[facility]  Patient:    [name], [name]                    MRN: [id number] Adm. Date:  [id number] Disc. Date: [id number] Attending:  Silvio Pate Dictator:   Earley Favor, RN, MSN, ACNP CC:         [name], M.D. [facility]  Enis Slipper, N.P., [facility]   Discharge Summary  DATE OF BIRTH:  April 01, 1946  DISCHARGE DIAGNOSES:  1. 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  2. Upper abdominal mass questionable etiology.  3. Hypertension.  HISTORY OF PRESENT ILLNESS:  [name] is a 65 year old female who had a recent admission to the hospital on September 29, 2000 to October 02, 2000 for asthma exacerbation with hypoxia. Chest x-ray and CT of the chest revealed extensive pulmonary fibrosis of questionable etiology. She was scheduled for a pulmonary consult with Dr. Shelle Iron but presented to the pulmonary office in acute distress prior to her scheduled time. Due to her elevated temperature of 102.6 and her dyspnea and her overall poor appearance, she was admitted to [facility] for further evaluation and treatment.  LABORATORY DATA:  Arterial blood gases on room air, pH 7.53, pCO2 of 36, PO2 of 56. WBC is 8.3, hemoglobin 13.0, hematocrit 38.5, platelets are 346. Sodium 136, potassium 3.9, chloride 107, CO2 26, glucose 76, BUN 9, creatinine 0.8, calcium 9.6. ALT is 36, ALP is 57, total bilirubin is 0.8. IgE serum is high at 182. Blood cultures showed no growth 5 days x 2. Sputum cultures were negative for Legionella. Respiratory culture was positive for Aspergillus species. Urine culture was negative for Legionella. Pulmonary function test revealed an FEC of 1.85 which is 69%, FEV1 of 2.17 which is 57% of predicted. FE1/FEC percentage is 67% of predicted. Lung volume shows a bladder capacity of 69%. Total lung capacity of 66% and reserve volume of 83%. Diffusion demonstrated a decrease in  DLCO to 57% which demonstrated some moderate obstructive lung defect. A 12 lead EKG shows normal sinus rhythm. Chest x-ray on October 05, 2000 shows no significant change, advanced bilateral pulmonary infiltrates consistent with silicosis or other exposure, no acute infiltrates are detected. No effusions are present. Stable chest. CT scan of the abdomen demonstrates a rounded mass seemingly arising on the left crus of the diaphragm as described above. Etiology is uncertain but does appear to be significantly smaller visually and by measuring when compared to September 29, 2000. It is not a very difficult location for percutaneous biopsy. Recommend short-term follow-up CT. 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 Pelvis no masses, adenopathy or fluid contractions. There is a densely calcified uterine fibroid.  IMPRESSION:  Unremarkable except for calcified uterine fibroid.  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  The patient was evaluated by Dr. Shelle Iron while in the hospital and she was found to be in chronic dyspnea. Her airway symptoms improved with interventions of IV antibiotics and IV steroids along with nebulized bronchodilators and supplemental oxygen. She reached maximal hospital benefit by October 10, 2000 and was discharged home. Upper abdominal mass of questionable etiology is questionably related to sarcoid. She will follow-up in three to four months with a limited CT and this will be with her primary care physician, Dr. Tawanna Cooler.  #2 - HYPERTENSION.  Hypertension was labile on initial admission. Adjustments were made of her antihypertensives with better control.  MEDICATIONS:  Prednisone 20 mg one q.d.  Augmentin 875 mg one tab two times a day.  Albuterol MDI one to two puffs four times a day p.r.n.  K-Dur 20 mEq one q.d.  Catapres 0.3 mg one tab two times a day.  Hydrochlorothiazide 25 mg one q.d.  Altace 15  mg one q.d.  DIET:  Low salt diet.  SPECIAL INSTRUCTIONS:  She is to call for any problems. She has a follow-up visit with nurse practitioner Minor on October 16, 2000 at 2:15 p.m. and then she will follow-up thereafter with Dr. Tawanna Cooler, her primary care physician.  DISPOSITION/CONDITION ON DISCHARGE:   Acute exacerbation of her respiratory sarcoid is improved. She is discharged in improved condition. DD:  10/22/00 TD:  10/22/00 Job: [id number]

## 2010-12-27 NOTE — Discharge Summary (Signed)
NAMEIJANAE, MACAPAGAL NO.:  [id number]   MEDICAL RECORD NO.:  [id number]          PATIENT TYPE:  OIB   LOCATION:  5034                         FACILITY:  [facility]   PHYSICIAN:  Myrtie Neither, MD      DATE OF BIRTH:  February 14, 1946   DATE OF ADMISSION:  01/16/2005  DATE OF DISCHARGE:  01/20/2005                                 DISCHARGE SUMMARY   This is a 65 year old black female followed in the office for recurrent  dislocation of her right total hip over the past two months.  Patient has  alcohol abuse problems.  __________ right hip with hip abduction brace,  gluteal atrophy.  Range of motion is good.  Nontender to palpation.  Last  dislocation possibly three weeks ago.   HOSPITAL COURSE:  Patient had preoperative laboratory CBC, BMET, EKG, chest  x-ray, medical evaluation by primary care doctor and was found to be stable  to undergo surgery.  Patient underwent right total hip revision with  revision of the femoral head using a longer length thereby in an attempt to  tighten the hip joint itself.  Patient tolerated procedure quite well.  Postoperative course fairly benign.  Patient did receive preoperative IV  antibiotics as well as prophylactic Coumadin, physical therapy,  weightbearing as tolerated on the right hip.  Patient progressed quite well  and was able to be discharged with home health and physical therapy at home  to return to the office in one week.  Patient was discharged in stable and  satisfactory condition.       AC/MEDQ  D:  02/25/2005  T:  02/25/2005  Job:  [id number]

## 2010-12-27 NOTE — Op Note (Signed)
NAMECAROLYN, [name] NO.:  [id number]   MEDICAL RECORD NO.:  [id number]          PATIENT TYPE:  INP   LOCATION:  0468                         [facility]ACILITY:  [facility]   PHYSICIAN:  Myrtie Neither, MD      DATE OF BIRTH:  [birth year]   DATE OF PROCEDURE:  12/14/2004  DATE OF DISCHARGE:                                 OPERATIVE REPORT   PREOPERATIVE DIAGNOSIS:  Dislocation, right total hip.   POSTOPERATIVE DIAGNOSIS:  Dislocation, right total hip.   PROCEDURE:  Closed manipulated reduction right total hip.   SURGEON:  Myrtie Neither, M.D.   ANESTHESIA:  General.   DESCRIPTION OF PROCEDURE:  The patient was taken to the operating room and  after adequate preoperative medication, she was given general anesthesia and  extubated.  With the use of C-arm, gentle manipulation of the right hip was  done, and the hip was reduced.  The patient was visualized with C-arm, knee  immobilizer then applied.  The patient tolerated the procedure well and sent  to the recovery room in stable and satisfactory condition.   DISPOSITION:  The patient is being admitted for 23-hour observation and will  be discharged in the a.m. and will be discharged on her same preoperative  medications and to use the knee immobilizer until fitted with a hip  abduction brace.  The patient will be seen back in the office in one week.      AC/MEDQ  D:  12/14/2004  T:  12/14/2004  Job:  [id number]

## 2010-12-27 NOTE — Discharge Summary (Signed)
[place]  Patient:    ALVARETTA, EISENBERGER                    MRN: [id number] Adm. Date:  [id number] Disc. Date: 11/26/99 Attending:  Evette Georges Dictator:   Cornell Barman, P.A. CC:         Evette Georges, M.D. [facility]                           Discharge Summary  DISCHARGE DIAGNOSES: 1. Dyspnea. 2. Asthmatic exacerbation. 3. Hypertension. 4. Tobacco use. 5. Hypokalemia.  HISTORY OF PRESENT ILLNESS:  Ms. Balducci is a 65 year old African-American female who presents with five days of progressive dyspnea, coughing, and chest congestion.  PAST MEDICAL HISTORY: 1. Childbirth x 4. 2. BTL.  ADMISSION LABORATORY DATA:  CBC was essentially normal.  Sodium was 134, potassium was 3.2, glucose was 122, albumin 3.4.  LFTs were normal.  Chest x-ray revealed end-stage lung disease with superimposed air space opacities in the upper lobes and superior segments of the lower lobes. Differential diagnosis included silicosis, emphysema with tuberculosis, sarcoidosis, eosinophilic granuloma, plus/minus pneumonia.  HOSPITAL COURSE: #1 - ASTHMATIC EXACERBATION:  The patient was admitted for IV steroids, antibiotics, nebulizer treatments, and oxygen.  The patients course was slow to improve.  After several days of IV steroids the patients condition was improving, and she was successfully tapered to oral steroids.  #2 - HYPERTENSION:  The patients blood pressure was poorly controlled.  Prior to admission the patient was only on hydrochlorothiazide.  We have added both Altace and clonidine, with some improvement in her blood pressure.  We will defer further management to her primary physician.  #3 - HYPOKALEMIA:  Likely secondary to the hydrochlorothiazide.  This has corrected.  DISCHARGE MEDICATIONS: 1. Prednisone 20 mg 3 tablets for two days, then 2 tablets for three days,    then 1 tablet for three days, then stop. 2. Hydrochlorothiazide 25 mg  q.d. 3. Claritin 10 mg q.d. 4. Clonidine 0.1 mg t.i.d. 5. Altace 2.5 mg b.i.d. 6. Serevent MDI 2 puffs b.i.d. 7. Flovent 110 mcg MDI 2 puffs b.i.d. to start after her prednisone course. 8. Tussionex 5 ml q.6h. p.r.n. cough.  FOLLOW-UP:  With Dr. Tawanna Cooler in the next week. DD:  11/26/99 TD:  11/26/99 Job: [id number]

## 2010-12-27 NOTE — Discharge Summary (Signed)
   NAMEHASANA, [name] NO.:  [id number]   MEDICAL RECORD NO.:  [id number]                   PATIENT TYPE:  INP   LOCATION:  5024                                 FACILITY:  [facility]   PHYSICIAN:  Myrtie Neither, M.D.                 DATE OF BIRTH:  [birth year]   DATE OF ADMISSION:  11/10/2002  DATE OF DISCHARGE:  11/12/2002                                 DISCHARGE SUMMARY   ADMITTING DIAGNOSIS:  Dislocation, right total hip.   DISCHARGE DIAGNOSIS:  Same.   COMPLICATIONS:  None.   INFECTIONS:  None.   OPERATION:  Closed manipulator reduction under anesthesia, right total hip.   PERTINENT HISTORY:  This is a 65 year old female who has had total hip  replaced two years ago.  The patient states that she fell out of the bed and  landed on her right hip, crawled back to the bed, and the following morning,  she was unable to get up and move.  The patient was brought to [facility]  Emergency Room and was found to have dislocation of her right total hip.   PERTINENT PHYSICAL EXAMINATION:  RIGHT HIP:  Old surgical scar laterally.  The right hip was internally rotated and flexed at the knee, tender to  palpation, neurovascular status was intact.   X-rays revealed dislocation of the right total hip.   PERTINENT LABORATORY:  The patient had CBC, C-MET and UA which were found to  be stable enough for the patient to undergo surgery.   The patient underwent closed manipulator reduction which she tolerated quite  well.  Postoperatively, she was placed in the hip abduction brace.  The  patient tolerated the brace quite well and is ambulatory with the use of a  walker and was able to be discharged on Percocet 1-4 p.r.n. pain, ambulation  keeping the right hip abduction brace on, and was able to be discharged in  stable and satisfactory condition to return to the office in one week.  The  patient was discharged in stable and satisfactory condition.                                     Myrtie Neither, M.D.    AC/MEDQ  D:  02/21/2003  T:  02/22/2003  Job:  [id number]

## 2010-12-27 NOTE — Op Note (Signed)
NAMEREMIE, MATHISON NO.:  [id number]   MEDICAL RECORD NO.:  [id number]          PATIENT TYPE:  OIB   LOCATION:  5034                         FACILITY:  [facility]   PHYSICIAN:  Myrtie Neither, MD      DATE OF BIRTH:  1946/03/15   DATE OF PROCEDURE:  01/16/2005  DATE OF DISCHARGE:  01/20/2005                                 OPERATIVE REPORT   PREOPERATIVE DIAGNOSIS:  Recurrent dislocation of right total hip.   POSTOPERATIVE DIAGNOSIS:  Recurrent dislocation of right total hip.   OPERATION PERFORMED:  Right total hip revision.   SURGEON:  Myrtie Neither, MD   ANESTHESIA:  General.   DESCRIPTION OF PROCEDURE:  The patient was taken to the operating room and  after given adequate preop medication and given general anesthesia and  intubated.  The patient was placed in right lateral position, right hip was  draped in a sterile manner with DuraPrep.  Posterior approach was made over  the right hip going through old previous scars going through skin and  subcutaneous tissue down to the fascia lata and into the capsule.  The  capsule was incised and resected. The hip was dislocated.  Inspection of the  joint revealed the patient has metal-metal insert acetabular component. This  appeared to be in good condition. There is some minimal wear about the  posterior lip.  It was elected to tighten the joint up using the [facility] head  and neck component and a 28 mm larger head component, size 1 taper was  selected and found to offer a lot more stability to the hip itself both in  flexion, internal-external rotation, no telescoping, no subluxation. Copious  irrigation was done after the implant was put in place.  The hip was  reduced.  Wound closure was then done with 0 Vicryl to the fascia, 2-0 for  the subcutaneous and skin staples to the skin.  Compressive dressing was  applied. The patient was placed back into a hip abduction brace.  The  patient tolerated the procedure well and  went to recovery room in stable and  satisfactory condition.       AC/MEDQ  D:  02/25/2005  T:  02/25/2005  Job:  [id number]

## 2010-12-27 NOTE — Consult Note (Signed)
NAMEJESSAH, [name] NO.:  [id number]   MEDICAL RECORD NO.:  [id number]          PATIENT TYPE:  INP   LOCATION:  1824                         FACILITY:  [facility]   PHYSICIAN:  Zola Button T. Lazarus Salines, M.D. DATE OF BIRTH:  May 23, 1946   DATE OF CONSULTATION:  11/25/2004  DATE OF DISCHARGE:                                   CONSULTATION   CHIEF COMPLAINT:  Neck and throat swelling.   HISTORY:  65 year old black female with COPD and hypertension was seen  yesterday by Dr. Alonza Smoker. She was given prednisone and lisinopril. She was  awakened approximately 2:30 this morning with tightness in her throat and  she noticed external neck swelling. Swallowing is somewhat uncomfortable. No  documented fever. She has never had a prior similar problem. Her voice feels  relatively normal but breathing seems perhaps slightly strained and  swallowing is definitely clumsy although no aspiration. She does not feel  like her tongue or lips are particularly swollen.   EXAMINATION:  This is a thin, middle-aged black female. She has a slight hot  potato voice and a little bit of anxiety although no obvious stridor or  labored respirations. Mental status seems appropriate. Voice is essentially  clear. Head is atraumatic and neck supple. Cranial nerves intact. Ear canals  are clear with normal drums. Anterior nose is noncongested. Oral cavity  reveals what to me appears to be slightly enlarged lips and tongue but she  claims they feel normal. Teeth in good repair. No edema or swelling in the  floor of the mouth. The oropharynx looks mildly edematous. Could not easily  see nasopharynx or hypopharynx.  Neck examination does reveal some soft  edematous tissues along the submandibular triangles and upper jugular chain  on both sides. No distinct tenderness. No overlying skin changes.   Following viscous Xylocaine to both sides of the nose, the flexible  laryngoscope was introduced through the right  side. The nasopharynx is  clear. Oropharynx is mildly edematous. She does have moderate supraglottic  edema including the epiglottis and the aryepiglottic folds. Vocal cords  themselves are mobile and did not seem edematous. No obvious aspiration. No  pooling in valleculae or piriforms. Airway is adequate for the time being.   IMPRESSION:  Angioedema secondary to lisinopril. Moderate supraglottic  laryngeal edema.   PLAN:  I spoke with Dr. Delford Field and Dr. Tawanna Cooler. We will admit her to the  intensive care unit.  I think at most she should try sips and chips until  her throat is definitely less swollen and we can be sure she will not  aspirate. I do not think she will require an airway intervention. I did  discuss this with the patient another family member.      KTW/MEDQ  D:  11/25/2004  T:  11/25/2004  Job:  [id number]   cc:   Shan Levans, M.D. [facility] A. Tawanna Cooler, M.D. [facility]

## 2010-12-27 NOTE — H&P (Signed)
[name] NO.:  [id number]   MEDICAL RECORD NO.:  [id number]          PATIENT TYPE:  OBV   LOCATION:  1844                         FACILITY:  [facility]   PHYSICIAN:  Myrtie Neither, MD      DATE OF BIRTH:  1946-04-10   DATE OF ADMISSION:  10/10/2004  DATE OF DISCHARGE:                                HISTORY & PHYSICAL   CHIEF COMPLAINT:  Dislocated right hip implant.   HISTORY OF PRESENT ILLNESS:  This is a 65 year old black female with right  total hip replacement three years ago.  The patient states that today she  was getting off of the bathroom and made a certain twist and dislocated her  right hip.  The patient was brought to [facility] emergency room.   PAST MEDICAL HISTORY:  High blood pressure, right total hip arthroplasty.   ALLERGIES:  ASPIRIN, PENICILLIN.   SOCIAL HISTORY:  The patient does drink and also smokes one pack per day.   REVIEW OF SYSTEMS:  Basically that of history of present illness.  No  cardiac, respiratory, and no urinary or bowel symptoms.   FAMILY HISTORY:  Noncontributory.   PHYSICAL EXAMINATION:  VITAL SIGNS:  Temperature 98.6, pulse 82,  respirations 18, blood pressure 129/87, alert and oriented in no acute  distress.  HEENT:  Normocephalic.  Eyes;  conjunctivae and sclerae injected.  NECK:  Supple.  CHEST:  Clear.  The patient does have alcohol on her breath.  HEART:  S1 and S2 regular.  EXTREMITIES:  Right hip internally rotated and shortened.  Old surgical  scar. Neurovascular status is intact.   X-rays revealed dislocation of right total hip.   IMPRESSION:  Dislocation of right total hip.   PLAN:  Closed manipulated reduction under anesthesia.      AC/MEDQ  D:  10/10/2004  T:  10/10/2004  Job:  [id number]

## 2010-12-27 NOTE — H&P (Signed)
[name], SINYARD NO.:  [id number]   MEDICAL RECORD NO.:  [id number]          PATIENT TYPE:  INP   LOCATION:  0469                         FACILITY:  [facility]   PHYSICIAN:  Myrtie Neither, MD      DATE OF BIRTH:  Jan 31, 1946   DATE OF ADMISSION:  12/16/2004  DATE OF DISCHARGE:                                HISTORY & PHYSICAL   CHIEF COMPLAINT:  Dislocation right hip.   HISTORY OF PRESENT ILLNESS:  This is a 65 year old who had just recently had  closed reduction of her dislocated right total hip three days ago.  The  patient returns this evening because she says that she turned over in bed  without her brace and dislocated her hip.  The patient came to [facility]  Emergency Room for treatment.   PAST MEDICAL HISTORY:  1.  COPD.  2.  Asthma.  3.  Sarcoid.  4.  High blood pressure.  5.  Total hip replacement.  6.  Total hip dislocation x 2.   FAMILY HISTORY:  Noncontributory.   REVIEW OF SYMPTOMS:  Chronic recurring cough.  Smoker's cough.   SOCIAL HISTORY:  Smokes a pack of cigarettes a day, and drinks alcohol. No  use of illegal drugs.   MEDICATIONS:  1.  Protonix.  2.  HCTZ.  3.  Benadryl.  4.  Albuterol.  5.  Claritin.  6.  Labetalol.   ALLERGIES:  PENICILLIN.   PHYSICAL EXAMINATION:  GENERAL:  Alert and oriented.  No acute distress.  Sitting in upright position.  VITAL SIGNS:  Temperature 98.6; pulse 108; respirations 20; blood pressure  188/110.  HEENT:  Head normocephalic.  Eyes:  Anicteric sclerae.  No ETOH on breath.  NECK:  Supple.  CHEST:  Clear.  Some rhonchi.  CARDIAC:  S1, S2 regular.  EXTREMITIES:  Right hip shortened.  Tender to palpation.  Attempt to a  closed reduction with IV Dilaudid was unsuccessful.   X-ray revealed dislocated right total hip.   IMPRESSION:  Dislocated right total hip.   PLAN:  Closed reduction under anesthesia, and placement in hip abduction  brace.      AC/MEDQ  D:  12/16/2004  T:  12/16/2004   Job:  [id number]

## 2010-12-27 NOTE — Discharge Summary (Signed)
[name], [name] NO.:  [id number]   MEDICAL RECORD NO.:  [id number]          PATIENT TYPE:  INP   LOCATION:  3102                         FACILITY:  [facility]   PHYSICIAN:  Shan Levans, M.D. LHCDATE OF BIRTH:  1945/12/21   DATE OF ADMISSION:  11/25/2004  DATE OF DISCHARGE:  11/28/2004                                 DISCHARGE SUMMARY   DISCHARGE DIAGNOSES:  1.  Upper airway obstruction, secondary to angioedema secondary to      angiotensin-converting enzyme inhibitor.  2.  Toxic metabolic encephalopathy.  3.  Acute renal insufficiency.   DIAGNOSTIC TESTS:  Flexible laryngoscope by Dr. Flo Shanks,  administrating:  Oropharynx mildly edematous, moderate supraglottic edema,  vocal cords mobile but not edematous.  No aspiration.  Impression:  Angioedema, secondary to lisinopril.  Date:  November 25, 2004.   LABORATORY DATA:  Quantitative BAL, obtained November 26, 2004:  No growth to  date.  Date:  November 26, 2004:  Potassium 4, chloride 100, CO2 23, glucose  95, BUN 19, creatinine 1.3.  Complete blood count:  White blood cells 3.4,  hemoglobin 12, hematocrit 36, platelets 191.  Chest x-ray, November 26, 2004:  Bilateral upper lobe fibrosis, consistent with history of sarcoidosis.  No  acute infiltrates or edema.   BRIEF HISTORY:  [name] is a pleasant 65 year old black female who was  seen by Dr. Tawanna Cooler approximately two weeks ago for management of her  hypertension.  Per Dr. Tawanna Cooler, the patient has had a very difficult time with  her medical adherence to her regimens as well as hypertensive management in  general.  She was placed on an ACE inhibitor after her last visit.  The  patient took her first dose of the lisinopril on November 24, 2004, and noted  that over the course the evening and into that night, she developed  extensive swelling into her neck associated with progressive shortness of  breath.  She awoke that morning unable to breathe or swallow.  She  presented  to the emergency room with notable upper airway edema.  She was evaluated by  Dr. Lazarus Salines, who had already seen the patient and obtained a CT scan of her  head, that demonstrating excessive soft tissue edema.  She was admitted to  the intensive care for further evaluation.   PROBLEM LIST BY DISCHARGE DIAGNOSES:  1.  Upper airway obstruction, secondary to angioedema secondary to ACE      inhibitors.  The patient was admitted to the intensive care.  During the      first six hours of her hospitalization, she developed progressive      swelling of her upper airway, requiring fiberoptic intubation in the      operating room by anesthesia.  The patient was intubated and supported      on mechanical ventilation from the afternoon of April 17 until the      morning of April 19 when the patient self-extubated herself.  Additional      therapies during her hospitalization included intravenous steroids, IV      Benadryl and, initially, empiric antibiotics  as well as proton pump      inhibitors.  The patient's edema continued to improve over the course of      her hospitalization.  At the time of discharge, she is now taking oral      prednisone, oral Benadryl and oral proton pump inhibitors.  Her edema is      completely resolved.  Her tongue is back to normal.  The anterior neck      and tongue are all normal, having no further difficulties with breathing      or swallowing.  2.  Acute delirium.  Thought to be secondary to high-dose IV steroids.      Completely resolved at the time of discharge, now currently on      prednisone taper.  3.  Acute renal insufficiency.  This problem is now resolved after      intravenous fluid resuscitation.   DISCHARGE INSTRUCTIONS:  1.  Medication:  Prednisone 40 mg x 4 days, followed by 30 mg x 4 days,      followed by 20 mg x 4 days; followed by 10 mg x 4 days then discontinue.  2.  Omeprazole 20 mg x 1 a day before breakfast for the next 30 days.   3.  Benadryl 50 mg over the counter every 8 hours for the next 2 days.  4.  The patient is advised that she may resume her hydrochlorothiazide.  She      was, however, instructed to discontinue her lisinopril and, furthermore,      report any ACE inhibitor as an allergy to any physician whom she comes      in contact with.   PAIN MANAGEMENT:  The patient was advised to take Tylenol as needed.   DIET:  Without restrictions.   FOLLOWUP:  With Dr. Tawanna Cooler at [facility] at 1:30 on November 29, 2004.  The patient was instructed to bring all of her pills and her discharge  summary sheet.   DISPOSITION UPON DISCHARGE:  Her vital signs are stable.  She is afebrile.  The angioedema is completely resolved, and the patient is ready for  discharge.      PB/MEDQ  D:  11/28/2004  T:  11/28/2004  Job:  [id number]   cc:   Shan Levans, M.D. [facility]   Marcelyn Bruins, M.D. [facility]   Karol T. Lazarus Salines, M.D.  [address] [place]  [place] [address]  Fax: [phone]   Eugenio Hoes. Tawanna Cooler, M.D. [facility]

## 2010-12-27 NOTE — H&P (Signed)
NAMEDEVAN, [name] NO.:  [id number]   MEDICAL RECORD NO.:  [id number]          PATIENT TYPE:  INP   LOCATION:  0468                         [facility]ACILITY:  [facility]   PHYSICIAN:  Myrtie Neither, MD      DATE OF BIRTH:  November 13, 1945   DATE OF ADMISSION:  12/13/2004  DATE OF DISCHARGE:                                HISTORY & PHYSICAL   CHIEF COMPLAINT:  Dislocation of right hip.   HISTORY OF PRESENT ILLNESS:  This is a 65 year old female with a past  history of right total hip dislocation.  The patient states that she had  been drinking today and went to sit down in the chair that was too low.  Before she knew it, the hip had dislocated.  The patient denies any other  injuries and came to [facility] Emergency Room for treatment.   PAST MEDICAL HISTORY:  1.  High blood pressure.  2.  Chronic obstructive pulmonary disease.  3.  Asthma.  4.  Emphysema.  5.  Sarcoidosis.   REVIEW OF SYSTEMS:  Chronic cough, episodes of asthma, asthmatic attacks.  No cardiac, no urinary or bowel symptoms.   DRUG ALLERGIES:  ACE inhibitors.  The patient had a recent allergic reaction  to an ACE inhibitor two weeks ago.   SOCIAL HISTORY:  The patient does drink alcohol.  She also smokes one pack  per day.   FAMILY HISTORY:  High blood pressure, diabetes mellitus and cancer.   MEDICATIONS:  1.  Prednisone.  2.  Hydrochlorothiazide.  3.  Benadryl.  4.  Albuterol.   PHYSICAL EXAMINATION:  VITAL SIGNS:  Temperature 97.3, blood pressure  163/96, pulse 78, respirations 16.  HEENT:  Head normocephalic.  Eyes: Conjunctivae and sclerae slightly  injected.  ETOH on breath.  NECK:  Supple.  CHEST:  Some rhonchi.  CARDIAC:  S1 and S2 regular.  ABDOMEN:  Soft, active bowel sounds.  EXTREMITIES:  Right hip internally rotated and flexed at the knee and hip.  Old surgical bilaterally.  NEUROVASCULAR:  Status intact.   LABORATORY DATA:  X-rays reveal the right total hip dislocation.   IMPRESSION:  Right total hip dislocation.   PLAN:  Closed reduction under anesthesia, right total hip.      AC/MEDQ  D:  12/14/2004  T:  12/14/2004  Job:  [id number]

## 2010-12-27 NOTE — Op Note (Signed)
[place]. [facility]  Patient:    [name], [name] Visit Number: [id number] MRN: [id number]          Service Type: SUR Location: [id number] Attending Physician:  Wende Mott Dictated by:   Kennieth Rad, M.D. Proc. Date: 06/17/01 Admit Date:  06/17/2001 Discharge Date: 06/21/2001                             Operative Report  PREOPERATIVE DIAGNOSIS: Aseptic necrosis with degenerative joint disease, right hip.  POSTOPERATIVE DIAGNOSIS: Aseptic necrosis with degenerative joint disease, right hip.  OPERATION/PROCEDURE: Right total hip arthroplasty.  SURGEON: Kennieth Rad, M.D.  ANESTHESIA: General.  DESCRIPTION OF PROCEDURE: The patient was taken to the operating room after giving adequate preoperative medication, given general anesthesia, and intubated.  The patient was placed in the right lateral position and the right hip was prepped with DuraPrep and draped in sterile manner.  A posterior southern approach was made over the right hip, going through the skin and subcutaneous tissue down to the gluteal and fascia.  The short rotators were identified and released.  Hemostasis was obtained with the Bovie.  The capsule was identified and resected.  The hip was then internally rotated and dislocated.  The deformed femoral head was then resected with the use of the femoral cutting guide.  The acetabulum was initially approached, using reamers for the acetabulum down to good bleeding bony surface.  After adequate reaming was done of the acetabulum the trial components were put in place. Appropriate size for the acetabulum was selected and the trial component hammered in place, and was found to be a very good snug fit.  Next, approach was made to the proximal femur using a cookie cutter, followed by using a reamer down the femoral canal.  After adequate reaming followed by broaching the appropriate size femoral stem was put in place.   Head and neck trials were tested and range of motion of the hip was tested with full flexion and extension, good internal and external rotation, no telescoping, and very good stability noted.  Trial components were removed and the final implant was put in place.  Again the joint was tested for range of motion and stability and leg length, which was found to be very good with stable hip.  Copious irrigation was done with antibiotic solution.  Wound closure was then done with 0 Vicryl for the fascia, 2-0 for the subcutaneous, skin staples for the skin.  A compressive dressing was applied.  The patient was put in a hip abduction pillow.  The patient tolerated the procedure quite well and went to he recovery room in stable and satisfactory condition. Dictated by:   Kennieth Rad, M.D. Attending Physician:  Wende Mott DD:  06/29/01 TD:  06/30/01 Job: [id number]

## 2010-12-27 NOTE — Op Note (Signed)
   [name], NICOLAISEN NO.:  [id number]   MEDICAL RECORD NO.:  [id number]                   PATIENT TYPE:  INP   LOCATION:  1823                                 FACILITY:  [facility]   PHYSICIAN:  Myrtie Neither, M.D.                 DATE OF BIRTH:  1945-11-16   DATE OF PROCEDURE:  11/10/2002  DATE OF DISCHARGE:                                 OPERATIVE REPORT   PREOPERATIVE DIAGNOSIS:  Dislocation right total hip.   POSTOPERATIVE DIAGNOSIS:  Dislocation right total hip.   PROCEDURE:  Closed manipulated reduction right total hip under general  anesthesia.   SURGEON:  Myrtie Neither, M.D.   DESCRIPTION OF PROCEDURE:  The patient was taken to the operating room and  after giving adequate general endotracheal intubated, the right hip was put  into a flexed position. C-arm was used to visualize closed reduction.  Traction was applied to the right hip with counter traction over the iliac  crest. The hip was reduced with minimal trauma.  This was done with the use  of C-arm. The patient was placed in a hip abduction pillow. The patient  tolerated the procedure well and was transferred to the recovery room in  stable condition.  The patient is being kept for 23-hour observation for  pain control as well as fitting for a hip abduction brace in a.m.  The  patient is to be discharged with full weightbearing using hip abduction  brace. Percocet one or two q.4h p.r.n. for pain and to return to the office  in 10 days.                                               Myrtie Neither, M.D.    AC/MEDQ  D:  11/10/2002  T:  11/11/2002  Job:  [id number]

## 2010-12-27 NOTE — Op Note (Signed)
NAMEMARCHIA, DIGUGLIELMO NO.:  [id number]   MEDICAL RECORD NO.:  [id number]          PATIENT TYPE:  INP   LOCATION:  0469                         FACILITY:  [facility]   PHYSICIAN:  Myrtie Neither, MD      DATE OF BIRTH:  06/24/1946   DATE OF PROCEDURE:  12/16/2004  DATE OF DISCHARGE:  12/16/2004                                 OPERATIVE REPORT   PREOPERATIVE DIAGNOSIS:  Dislocation, right total hip.   POSTOPERATIVE DIAGNOSIS:  Dislocation, right total hip.   ANESTHESIA:  General.   PROCEDURE:  Closed manipulation/reduction, right total hip under anesthesia.   Patient was taken to the operating room.  After preoperative medications and  general anesthesia and intubated, the patient in supine position with the  use of C-arm, gentle manipulation of the right hip was done, reducing the  hip.  This was visualized with the use of the C-arm.  The patient was placed  into a hip abduction brace.  The patient tolerated the procedure quite well  and went to the recovery room in stable, satisfactory condition.       AC/MEDQ  D:  02/05/2005  T:  02/05/2005  Job:  [id number]

## 2010-12-27 NOTE — H&P (Signed)
NAMELINDEN, MIKES NO.:  [id number]   MEDICAL RECORD NO.:  [id number]          PATIENT TYPE:  OIB   LOCATION:  5034                         FACILITY:  [facility]   PHYSICIAN:  Myrtie Neither, MD      DATE OF BIRTH:  [birth year]   DATE OF ADMISSION:  01/16/2005  DATE OF DISCHARGE:  01/20/2005                                HISTORY & PHYSICAL   CHIEF COMPLAINT:  Recurrent dislocation right total hip.   HISTORY OF PRESENT ILLNESS:  This is a 65 year old female followed in the  office and has had recurrent right hip dislocations over the past two  months.  Most recent one was three weeks ago.  Patient is being kept in the  hip abduction brace at all times.  Patient had right total hip replacement  four years ago and recently began to develop dislocations.  Patient does  have a problem with alcohol abuse.   PAST MEDICAL HISTORY:  1.  High blood pressure.  2.  Asthma.  3.  COPD.  4.  Reflux.  5.  Recurrent right total hip dislocation.  6.  Right total hip replacement five years ago.  7.  Tubal ligation 20 years ago.  8.  History of sarcoidosis.   ALLERGIES:  ACE INHIBITORS, ASPIRIN, PENICILLIN.   MEDICATIONS:  1.  Hydrochlorothiazide 25 mg.  2.  Albuterol inhaler.  3.  Advair 100/50 one puff b.i.d.  4.  Protonix 20 mg daily.  5.  Aleve one to two q.4h. p.r.n.   SOCIAL HISTORY:  Alcohol abuse.  Smokes one pack per day.  Patient lives  with her husband.   REVIEW OF SYSTEMS:  Shortness of breath, episodes of emphysema and  bronchitis, wheezing.  No urinary or bowel symptoms.   FAMILY HISTORY:  Noncontributory.   PHYSICAL EXAMINATION:  VITAL SIGNS:  Temperature 97.1, pulse 107,  respirations 16, blood pressure 118/79, height 5 feet 4 inches, weight 123.  HEAD:  Normocephalic.  EYES:  Conjunctivae are clear.  CHEST:  Some rhonchi base of the lung.  CARDIAC:  S1, S2 regular.  ABDOMEN:  Soft, active bowel sounds.  EXTREMITIES:  Right hip recurrent dislocation,  nontender.  Range of motion  is good.  Gluteal atrophy.   IMPRESSION:  1.  Recurrent dislocation right total hip.  2.  Chronic obstructive pulmonary disease.  3.  High blood pressure.  4.  Asthma.  5.  Reflux.   PLAN:  Right total hip revision.       AC/MEDQ  D:  02/25/2005  T:  02/25/2005  Job:  [id number]

## 2010-12-27 NOTE — Discharge Summary (Signed)
[place]. [facility]  Patient:    [name], [name] Visit Number: [id number] MRN: [id number]          Service Type: SUR Location: 5000 [id number] Attending Physician:  Wende Mott Dictated by:   Kennieth Rad, M.D. Admit Date:  06/17/2001 Discharge Date: 06/21/2001                             Discharge Summary  ADMITTING DIAGNOSIS: Aseptic necrosis with degenerative joint disease of right hip.  DISCHARGE DIAGNOSIS: Aseptic necrosis with degenerative joint disease of right hip.  COMPLICATIONS: None.  INFECTIONS: None.  OPERATION/PROCEDURE: Right total hip arthroplasty.  HISTORY OF PRESENT ILLNESS: This is a 65 year old female, followed in the office for degenerative joint disease and aseptic necrosis of her right hip. The patient progressively worsened with pain at rest as well as on ambulation.  PHYSICAL EXAMINATION:  Pertinent for right hip limited range of motion, antalgic gait with limp on the right side.  Pain on hip extension and rotation, and resistive abduction.  HOSPITAL COURSE: The patient underwent right total hip arthroplasty and tolerated the procedure quite well.  The patient had a Biomet total hip system used with use of the integral femoral porous coated femoral stem 12 x 140 mm stem, type 1 taper, 28 mm x 23 size acetabular liner, and a hemispheric acetabular plug 50 mm solid with apical dome hole for the total hip implants. The patient did quite well with physical therapy, partial weightbearing on the right side.  The patient had IV antibiotics as well as Coumadin therapy.  She progressed quite well and was able to be discharged.  DISCHARGE MEDICATIONS:  1. Continued on Coumadin.  2. Percocet q.4h p.r.n. for pain.  FOLLOW-UP: Home health and physical therapy at home.  To return to the office in one week.  DISCHARGE CONDITION: Stable and satisfactory. Dictated by:   Kennieth Rad, M.D. Attending Physician:   Wende Mott DD:  06/29/01 TD:  06/30/01 Job: [id number]

## 2010-12-27 NOTE — Op Note (Signed)
NAMEMARIMAR, SUBER NO.:  [id number]   MEDICAL RECORD NO.:  [id number]          PATIENT TYPE:  INP   LOCATION:  0469                         FACILITY:  [facility]   PHYSICIAN:  Myrtie Neither, MD      DATE OF BIRTH:  [birth year]   DATE OF PROCEDURE:  12/16/2004  DATE OF DISCHARGE:                                 OPERATIVE REPORT   PREOPERATIVE DIAGNOSIS:  Dislocation right total hip.   POSTOPERATIVE DIAGNOSIS:  Dislocation right total hip.   ANESTHESIA:  General anesthesia.   PROCEDURE:  Closed manipulated reduction under anesthesia.   The patient was taken to the operating room after being given adequate  preoperative medication, given general anesthesia, and intubated.  The right  hip was gently manipulated and reduced.  C-arm was used to visualize  reduction.  The patient tolerated the procedure quite well and went to the  recovery room in stable and satisfactory condition.  The patient is being  admitted for 23-hour observation.  The patient is to continue with the hip  abduction brace at all times and ambulate with the use of a walker.  The  patient will be discharged in the a.m.  The patient will need possible right  total hip revision.  Will see her back in the office this week.      AC/MEDQ  D:  12/16/2004  T:  12/16/2004  Job:  [id number]

## 2010-12-27 NOTE — Op Note (Signed)
[name], [name] NO.:  [id number]   MEDICAL RECORD NO.:  [id number]          PATIENT TYPE:  INP   LOCATION:  0468                         FACILITY:  [facility]   PHYSICIAN:  Myrtie Neither, MD      DATE OF BIRTH:  1945-11-27   DATE OF PROCEDURE:  12/13/2004  DATE OF DISCHARGE:  12/14/2004                                 OPERATIVE REPORT   PREOPERATIVE DIAGNOSIS:  Dislocation, right total hip.   POSTOPERATIVE DIAGNOSIS:  Dislocation, right total hip.   ANESTHESIA:  General.   PROCEDURE:  Closed manipulation/reduction, right total hip, under general  anesthesia.   Patient was taken to the operating room.  After being given adequate  preoperative medications, given general anesthesia, and intubated, patient  was placed in a supine position with the use of the C-arm to visualize the  manipulation/reduction.  The right hip was gently manipulated and reduced.  Visualized with the use of C-arm.  The patient was placed in a hip abduction  brace.  The patient tolerated the procedure quite well in the recovery room  in a stable, satisfactory condition.   The patient was discharged home to return to the office in two weeks to  continue her hip abduction brace at all times and was discharged on Percocet  1 q.4h. p.r.n. pain.  Patient was discharged in stable and satisfactory  condition       AC/MEDQ  D:  02/05/2005  T:  02/05/2005  Job:  [id number]

## 2010-12-27 NOTE — H&P (Signed)
[facility]  Patient:    [name], [name] Visit Number: [id number] MRN: [id number]          Service Type: SUR Location: [id number] Attending Physician:  Wende Mott Dictated by:   Kennieth Rad, M.D. Admit Date:  06/17/2001 Discharge Date: 06/21/2001                           History and Physical  CHIEF COMPLAINT: Painful right hip.  HISTORY OF PRESENT ILLNESS: This is a 65 year old black female, who had been having severe right hip pain for the past year.  The patient denies any history of trauma but had been having progressive increased pain both on ambulation as well as at rest.  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  The patient had done fairly well with anti-inflammatories but more recently increased pain with ambulation as well as at bed rest.  PAST MEDICAL HISTORY:  1. Asthma.  2. Allergic rhinitis.  3. Depression.  4. Sarcoidosis.  5. High blood pressure.  6. Emphysema.  7. Orthopnea.  ALLERGIES:  1. PENICILLIN.  2. ISORDIL.  MEDICATIONS:  1. Hydrochlorothiazide.  2. Clonidine.  3. Zyrtec.  4. Lodine.  5. Klor-Con.  6. Advent puffs.  7. Ambien.  8. K-Dur.  9. Eldertonic. 10. Darvocet-N. 11. Protonix. 12. Tramadol.  SOCIAL HISTORY: Habits, occasional use of alcohol.  REVIEW OF SYSTEMS: Some episodes of shortness of breath from sarcoid and emphysema.  No urinary or bowel symptoms.  PHYSICAL EXAMINATION:  VITAL SIGNS: Temperature 97.7 degrees, pulse 84, respirations 20, blood pressure 134/84.  Height 65-1/2 inches.  Weight 145 pounds.  HEENT: Normocephalic.  Eyes, conjunctivae and sclerae clear.  NECK: Supple.  CHEST: Clear.  CARDIAC: S1 and S2, regular.  EXTREMITIES: Right hip with gluteal weakness, limited range of motion with pain on flexion and rotation.  Pain on restrictive hip extension and abduction.  Antalgic gait on  ambulation.  IMPRESSION: Aseptic necrosis, right hip, with degenerative joint disease. Dictated by:   Kennieth Rad, M.D. Attending Physician:  Wende Mott DD:  06/29/01 TD:  06/30/01 Job: [id number]

## 2010-12-27 NOTE — H&P (Signed)
   NAMEBRITZY, GRAUL NO.:  [id number]   MEDICAL RECORD NO.:  [id number]                   PATIENT TYPE:  INP   LOCATION:  1823                                 FACILITY:  [facility]   PHYSICIAN:  Myrtie Neither, M.D.                 DATE OF BIRTH:  [birth year]   DATE OF ADMISSION:  11/10/2002  DATE OF DISCHARGE:                                HISTORY & PHYSICAL   CHIEF COMPLAINT:  Painful right total hip.   HISTORY OF PRESENT ILLNESS:  This is a 65 year old female who had undergone  right total hip two years ago. The patient states that last night, she fell  out of bed and landed onto the right hip and then crawled back into bed.  This morning, she was unable to move her toes or move her right lower  extremity.  The patient was brought to [facility] emergency room  via ambulance. The patient denies any other previous problems with her right  total hip.   PAST MEDICAL HISTORY:  High blood pressure, COPD, tubal ligation.   FAMILY HISTORY:  History of high blood pressure, diabetes mellitus, and  cancer.   SOCIAL HISTORY:  The patient is married.  The patient smoked one pack per  day.  Uses alcohol socially.   ALLERGIES:  No known drug allergies.   MEDICATIONS:  1. Advil.  2. Protonix.  3. Albuterol.  4. KCL.  5. Zyrtec.  6. Hydrochlorothiazide.   REVIEW OF SYSTEMS:  Some shortness of breath on exertion, some reflux  esophagitis. No cardiac symptoms.  No urinary or bowel symptoms.   PHYSICAL EXAMINATION:  GENERAL: Alert and oriented and in no acute distress.  VITAL SIGNS: Temperature 98.4, pulse 90, respirations 18, blood pressure  113/70.  HEENT:  Normocephalic and atraumatic.  Conjunctivae and sclerae clear.  NECK:  Supple.  CHEST:  Clear.  HEART:  S1 and S2 regular.  ABDOMEN:  Soft with active bowel sounds.  EXTREMITIES:  Right hip old surgical scar laterally peripherally  posteriorly.  Internally rotated, flexed at the knee.   Tender to palpation.  Pulses intact. The patient is able to move her toes quite well in the right  foot.   X-ray revealed dislocation of right total hip.    IMPRESSION:  Dislocation right total hip.   PLAN:  Closed reduction under general anesthesia.                                               Myrtie Neither, M.D.    AC/MEDQ  D:  11/10/2002  T:  11/11/2002  Job:  [id number]

## 2010-12-30 NOTE — Discharge Summary (Signed)
  NAMECHEILA, [name] NO.:  [id number]  MEDICAL RECORD NO.:  [id number]           PATIENT TYPE:  O  LOCATION:  2550                         FACILITY:  [facility]  PHYSICIAN:  Myrtie Neither, MD      DATE OF BIRTH:  1945-12-07  DATE OF ADMISSION:  12/16/2010 DATE OF DISCHARGE:  12/16/2010                              DISCHARGE SUMMARY   ADMITTING DIAGNOSIS:  Recurrent dislocation, right total hip.  DISCHARGE DIAGNOSIS:  Recurrent dislocation, right total hip.  COMPLICATIONS:  None.  INFECTIONS:  None.  OPERATION:  Closed manipulated reduction, right total hip.  PERTINENT HISTORY:  This is a 65 year old with history of recurrent dislocation.  The patient states she popped her posterior hip out yesterday and came to [facility] Emergency Room today.  Pertinent physical was that of the right hip internally rotated, shortened, tender anterior and laterally.  X-rays revealed dislocation of the right total hip.  HOSPITAL COURSE:  The patient underwent attempted closed manipulated reduction in the emergency room which was not successful.  The patient was operating room and had closed reduction.  She tolerated the procedure quite well, had preop laboratory, CBC, CMET, UA, EKG, and chest x-ray . the patient was stable enough to be discharged and discharged with knee immobilizer, Percocet 1 q.6 p.r.n. for pain and return to the office in 2 weeks.  The patient is discharged in stable and satisfactory condition.     Myrtie Neither, MD     AC/MEDQ  D:  12/26/2010  T:  12/27/2010  Job:  [id number]  Electronically Signed by Myrtie Neither MD on 12/30/2010 03:10:41 PM

## 2011-01-14 ENCOUNTER — Encounter: Payer: Self-pay | Admitting: Internal Medicine

## 2011-01-28 ENCOUNTER — Ambulatory Visit (INDEPENDENT_AMBULATORY_CARE_PROVIDER_SITE_OTHER): Payer: Self-pay | Admitting: Internal Medicine

## 2011-01-28 ENCOUNTER — Encounter: Payer: Self-pay | Admitting: Internal Medicine

## 2011-01-28 DIAGNOSIS — J449 Chronic obstructive pulmonary disease, unspecified: Secondary | ICD-10-CM

## 2011-01-28 DIAGNOSIS — I10 Essential (primary) hypertension: Secondary | ICD-10-CM

## 2011-01-28 DIAGNOSIS — M79609 Pain in unspecified limb: Secondary | ICD-10-CM

## 2011-01-28 MED ORDER — FLUTICASONE-SALMETEROL 500-50 MCG/DOSE IN AEPB: 1.0000 | INHALATION_SPRAY | Freq: Two times a day (BID) | RESPIRATORY_TRACT | Status: DC

## 2011-01-28 MED ORDER — CALCIUM-VITAMIN D 500-200 MG-UNIT PO TABS: 1.0000 | ORAL_TABLET | Freq: Two times a day (BID) | ORAL | Status: DC

## 2011-01-28 MED ORDER — ALBUTEROL SULFATE HFA 108 (90 BASE) MCG/ACT IN AERS: 2.0000 | INHALATION_SPRAY | Freq: Four times a day (QID) | RESPIRATORY_TRACT | Status: DC | PRN

## 2011-01-28 MED ORDER — TIOTROPIUM BROMIDE MONOHYDRATE 18 MCG IN CAPS: 18.0000 ug | ORAL_CAPSULE | Freq: Every day | RESPIRATORY_TRACT | Status: DC

## 2011-01-28 NOTE — Assessment & Plan Note (Addendum)
Stable, has scattered wheezes and coarse breath sounds on lung exam; however, this is patient's baseline.  She reports feeling well and denies any SOB, increase cough, fever, or chills.  She states that she has been out of her advair and spiriva in the past month because of medicaid issue and only use albuterol prn. I explained to her the importance of adhering to spiriva and advair so that we can avoid hospitalization.  She voiced understanding and states that she will go to pharmacy today to see if they would dispense the meds to her w/o her actual medicaid card.   -Will give patient a sample of advair from clinic; however, patient left before we could give her Advair sample.  My nurse will call patient and have her come back to pick up sample. -Patient was instructed to return to clinic for further evaluation if she start to have symptoms such as fever, cough, increased in SOB.   Prescription of advair and spiriva given to patient today.

## 2011-01-28 NOTE — Assessment & Plan Note (Signed)
BP: Adequately controlled.  BP today 135/72.   Will have patient take 1 tablet of chlorthalidone 25mg   instead of 1/2 tab.   Will continue metoprolol 25mg  po bid

## 2011-01-28 NOTE — Progress Notes (Signed)
HPI: 65 yo woman well known to me with history of COPD, sarcoidosis, GERD, multiple right hip dislocations presents for routine follow up.  She reports feeling well today and just wants refills.  She has been taking 1/2 tablet of chlorthalidone instead of 1 tablet and takes metoprolol 25mg  bid.  Also, she ran out of advair and spiriva in the past month because of medicaid issue.  No other complaints, denies any fever, chills, N/V, SOB or chestpain.  She had right hip dislocation in 5/12 which was reduced by Dr. Montez Morita but has not been back to see him in the office.   -Reports having had mammogram last year and colonoscopy 10 years ago and that Dr. Russella Dar has sent her a letter for repeat colonoscopy.  ROS: as per HPI  General: alert, thin-frail appearing, and cooperative to examination.  Head: normocephalic and atraumatic.  Eyes: vision grossly intact, pupils equal, pupils round, pupils reactive to light, no injection and anicteric.  Mouth: pharynx pink and moist, no erythema, and no exudates.  Neck: supple, full ROM, no thyromegaly, no JVD, and no carotid bruits.  Lungs: coarse breath sounds bilaterally, scattered expiratory wheezes, decreased breath sounds at bases. (this is her baseline). No accessory muscle use. Heart: distant heart sounds, normal rate, regular rhythm, no murmur, no gallop, and no rub.  Abdomen: soft, non-tender, normal bowel sounds, no distention, no guarding, no rebound tenderness, no hepatomegaly, and no splenomegaly.  Msk: no joint swelling, no joint warmth, and no redness over joints.  Pulses: 2+ DP/PT pulses bilaterally Extremities: No cyanosis, clubbing, edema Neurologic: alert & oriented X3, cranial nerves II-XII intact, strength normal in all extremities, sensation intact to light touch, and gait normal.  Skin: turgor normal and no rashes.  Psych: Oriented X3, memory intact for recent and remote, normally interactive, good eye contact, not anxious appearing, and not  depressed appearing.

## 2011-01-28 NOTE — Patient Instructions (Signed)
Please follow up with Dr. Montez Morita for your right hip Will schedule for mammogram Please schedule colonoscopy with Dr. Russella Dar Follow up with Dr. Anselm Jungling in 3-4 months Please bring your medicaid paperwork to pharmacy so you can get your Advair and Spiriva Take Metoprolol 25mg  twice daily Take Chlorthalidone 25mg  qd

## 2011-01-28 NOTE — Assessment & Plan Note (Signed)
S/p right hip dislocation.  She denies any symptoms today except for mild discomfort.  She states that she will follow up with Dr. Montez Morita soon for further intervention since she continues to have recurrent dislocations and reductions.

## 2011-02-26 ENCOUNTER — Other Ambulatory Visit: Payer: Self-pay | Admitting: Licensed Clinical Social Worker

## 2011-02-26 ENCOUNTER — Ambulatory Visit: Payer: Self-pay | Admitting: Licensed Clinical Social Worker

## 2011-02-26 DIAGNOSIS — J449 Chronic obstructive pulmonary disease, unspecified: Secondary | ICD-10-CM

## 2011-02-26 NOTE — Progress Notes (Signed)
60 minutes.  Assist with Medicare initial questionnaire by calling Medicare with patient. Patient's Medicare A and B becomes effective on 05/12/11.  Assisted patient in completing application for Extra Help.  Patient lives with husband Windy Fast (also a patient in [facility]) and their estimated income is as follows:  [employer]' Social Security  $1,200;  Ronald's pension thru [place]  $390;  [employer] Pare' Soc. Security:  [phone]  Patient needs to enter exact income information and sign and date application otherwise application was completely filled out.  Patient will mail.  Also provided [employer] information regarding Medicare D info and assistance program.  Patient said she is selecting [employer] for Medicare D but has not completed process as of yet.  Has my card for any other questions or concerns.

## 2011-05-28 ENCOUNTER — Ambulatory Visit (INDEPENDENT_AMBULATORY_CARE_PROVIDER_SITE_OTHER): Payer: Medicare Other | Admitting: Internal Medicine

## 2011-05-28 ENCOUNTER — Encounter: Payer: Self-pay | Admitting: Internal Medicine

## 2011-05-28 VITALS — BP 127/88 | HR 89 | Temp 98.1°F | Ht 65.0 in | Wt 113.8 lb

## 2011-05-28 DIAGNOSIS — L299 Pruritus, unspecified: Secondary | ICD-10-CM | POA: Insufficient documentation

## 2011-05-28 DIAGNOSIS — M79622 Pain in left upper arm: Secondary | ICD-10-CM

## 2011-05-28 DIAGNOSIS — Z23 Encounter for immunization: Secondary | ICD-10-CM

## 2011-05-28 DIAGNOSIS — M79609 Pain in unspecified limb: Secondary | ICD-10-CM

## 2011-05-28 MED ORDER — MELOXICAM 15 MG PO TABS: 15.0000 mg | ORAL_TABLET | Freq: Every day | ORAL | Status: DC

## 2011-05-28 MED ORDER — CHOLESTYRAMINE 4 G PO PACK: 1.0000 | PACK | Freq: Two times a day (BID) | ORAL | Status: DC

## 2011-05-28 NOTE — Patient Instructions (Signed)
Please, follow up with an ultrasound and a mammogram as was instructed. Please, fill in  A prescription for meloxicam (tramadol is not on your insurance formulary). Call if you develop any rash, swelling, wheezing or SOB. Please, return to clinic in 2-3 days after ultrasound is done. Thank you.

## 2011-05-29 ENCOUNTER — Encounter: Payer: Self-pay | Admitting: Internal Medicine

## 2011-05-29 NOTE — Progress Notes (Signed)
HPI: 1. Reports 1 week history of right "arm pit lump." Denies any injuries, strenuous exercise, fever,chills rash. Denies any similar Sx in the past.  No past medical history on file. Current Outpatient Prescriptions  Medication Sig Dispense Refill  . albuterol (PROVENTIL) (2.5 MG/3ML) 0.083% nebulizer solution Take 2.5 mg by nebulization every 6 (six) hours as needed. As needed for SOB       . albuterol (VENTOLIN HFA) 108 (90 BASE) MCG/ACT inhaler Inhale 2 puffs into the lungs every 6 (six) hours as needed.  1 Inhaler  6  . Calcium Carbonate-Vitamin D (CALCIUM-VITAMIN D) 500-200 MG-UNIT per tablet Take 1 tablet by mouth 2 (two) times daily with a meal.  60 tablet  11  . chlorthalidone (HYGROTON) 25 MG tablet Take 1 tablet (25 mg total) by mouth daily.  30 tablet  11  . Fluticasone-Salmeterol (ADVAIR DISKUS) 500-50 MCG/DOSE AEPB Inhale 1 puff into the lungs every 12 (twelve) hours.  60 each  6  . metoprolol tartrate (LOPRESSOR) 25 MG tablet Take 1 tablet (25 mg total) by mouth 2 (two) times daily.  60 tablet  11  . Multiple Vitamin (MULTIVITAMIN) tablet Take 1 tablet by mouth daily.        Marland Kitchen tiotropium (SPIRIVA) 18 MCG inhalation capsule Place 1 capsule (18 mcg total) into inhaler and inhale daily.  30 capsule  6  . meloxicam (MOBIC) 15 MG tablet Take 1 tablet (15 mg total) by mouth daily.  30 tablet  0   Family History  Problem Relation Age of Onset  . Cancer Mother   . Heart disease Mother   . Stroke Father   . Cancer Sister   . Heart disease Sister   . Asthma Daughter   . Cancer Maternal Aunt   . Cancer Other   . Diabetes Other   . Hypertension Other    History   Social History  . Marital Status: Married    Spouse Name: N/A    Number of Children: N/A  . Years of Education: N/A   Social History Main Topics  . Smoking status: Former Smoker -- 0.2 packs/day for 30 years    Quit date: 05/11/2010  . Smokeless tobacco: None  . Alcohol Use: None  . Drug Use: None  . Sexually  Active: None   Other Topics Concern  . None   Social History Narrative   Married with 4 children. Retired, previously worked as a Naval architect.    Review of Systems: Constitutional: Denies fever, chills, diaphoresis, appetite change and fatigue.  HEENT: Denies photophobia, eye pain, redness, hearing loss, ear pain, congestion, sore throat, rhinorrhea, sneezing, mouth sores, trouble swallowing, neck pain, neck stiffness and tinnitus.  Respiratory: Denies SOB, DOE, cough, chest tightness, and wheezing.  Cardiovascular: Denies chest pain, palpitations and leg swelling.  Gastrointestinal: Denies nausea, vomiting, abdominal pain, diarrhea, constipation, blood in stool and abdominal distention.  Genitourinary: Denies dysuria, urgency, frequency, hematuria, flank pain and difficulty urinating.  Musculoskeletal: Denies myalgias, back pain, joint swelling, arthralgias and gait problem.  Skin: Denies pallor, rash and wound.  Neurological: Denies dizziness, seizures, syncope, weakness, light-headedness, numbness and headaches.  Hematological: Denies adenopathy. Easy bruising, personal or family bleeding history  Psychiatric/Behavioral: Denies suicidal ideation, mood changes, confusion, nervousness, sleep disturbance and agitation   Vitals: reviewed General: alert, well-developed, and cooperative to examination.  Head: normocephalic and atraumatic.  Eyes: vision grossly intact, pupils equal, pupils round, pupils reactive to light, no injection and anicteric.  Mouth: pharynx pink and  moist, no erythema, and no exudates.  Neck: supple, full ROM, no thyromegaly, no JVD, and no carotid bruits.  Lungs: normal respiratory effort, no accessory muscle use, normal breath sounds, no crackles, and no wheezes. Heart: normal rate, regular rhythm, no murmur, no gallop, and no rub.  Abdomen: soft, non-tender, normal bowel sounds, no distention, no guarding, no rebound tenderness, no hepatomegaly, and no  splenomegaly.  Msk: no joint swelling, no joint warmth, and no redness over joints.  Pulses: 2+ DP/PT pulses bilaterally Extremities: No cyanosis, clubbing, edema Neurologic: alert & oriented X3, cranial nerves II-XII intact, strength normal in all extremities, sensation intact to light touch, and gait normal.  Skin: turgor normal and no rashes.  Psych: Oriented X3, memory intact for recent and remote, normally interactive, good eye contact, not anxious appearing, and not depressed appearing.    Assessment & Plan:  1. Left axillary pain. No masses or nodules palpated.  However, the patient has FMHx of BCA -mammogram -[place] of right axilla to evaluate for hypodermatomy, tumors. - meloxicam for pain PRN. Call ASAP if develop rash, wheezing, swelling or any other Sx.

## 2011-05-29 NOTE — Progress Notes (Deleted)
  Subjective:    Patient ID: [name], female    DOB: 1946-06-07, 65 y.o.   MRN: [id number]  HPI    Review of Systems     Objective:   Physical Exam        Assessment & Plan:

## 2011-06-18 ENCOUNTER — Inpatient Hospital Stay (HOSPITAL_COMMUNITY): Admission: RE | Admit: 2011-06-18 | Payer: Medicare Other | Source: Ambulatory Visit

## 2011-06-18 ENCOUNTER — Ambulatory Visit (HOSPITAL_COMMUNITY): Payer: Medicare Other

## 2011-07-16 ENCOUNTER — Ambulatory Visit (HOSPITAL_COMMUNITY): Admission: RE | Admit: 2011-07-16 | Payer: Medicare Other | Source: Ambulatory Visit

## 2011-07-17 ENCOUNTER — Other Ambulatory Visit: Payer: Self-pay | Admitting: Internal Medicine

## 2011-07-18 ENCOUNTER — Other Ambulatory Visit: Payer: Self-pay | Admitting: Internal Medicine

## 2011-07-18 ENCOUNTER — Telehealth: Payer: Self-pay | Admitting: *Deleted

## 2011-07-18 DIAGNOSIS — N644 Mastodynia: Secondary | ICD-10-CM

## 2011-07-18 NOTE — Telephone Encounter (Signed)
CALLED PATIENT AND LEFT VOICE MESSAGE; APPT. December 20.012 @ 9:50AM (9:40AM ARRIVAL) [facility].  [address] / [address].  APPT LETTER ALSO MAILED TO THE PATIENT. [name] NTII   12-7-012  12:10PM

## 2011-10-09 ENCOUNTER — Encounter: Payer: Self-pay | Admitting: Internal Medicine

## 2011-10-09 ENCOUNTER — Ambulatory Visit (INDEPENDENT_AMBULATORY_CARE_PROVIDER_SITE_OTHER): Payer: Medicare Other | Admitting: Internal Medicine

## 2011-10-09 ENCOUNTER — Ambulatory Visit (HOSPITAL_COMMUNITY)
Admission: RE | Admit: 2011-10-09 | Discharge: 2011-10-09 | Disposition: A | Discharge status: Home / Self Care | Payer: Medicare Other | Source: Ambulatory Visit | Attending: Internal Medicine | Admitting: Internal Medicine

## 2011-10-09 VITALS — BP 153/90 | HR 95 | Temp 97.6°F | Ht 65.0 in | Wt 119.7 lb

## 2011-10-09 DIAGNOSIS — J449 Chronic obstructive pulmonary disease, unspecified: Secondary | ICD-10-CM

## 2011-10-09 DIAGNOSIS — R05 Cough: Secondary | ICD-10-CM | POA: Insufficient documentation

## 2011-10-09 DIAGNOSIS — D869 Sarcoidosis, unspecified: Secondary | ICD-10-CM

## 2011-10-09 DIAGNOSIS — R059 Cough, unspecified: Secondary | ICD-10-CM | POA: Insufficient documentation

## 2011-10-09 DIAGNOSIS — R062 Wheezing: Secondary | ICD-10-CM | POA: Insufficient documentation

## 2011-10-09 MED ORDER — TIOTROPIUM BROMIDE MONOHYDRATE 18 MCG IN CAPS: 18.0000 ug | ORAL_CAPSULE | Freq: Every day | RESPIRATORY_TRACT | Status: DC

## 2011-10-09 MED ORDER — PANTOPRAZOLE SODIUM 40 MG PO TBEC: 40.0000 mg | DELAYED_RELEASE_TABLET | Freq: Every day | ORAL | Status: DC

## 2011-10-09 MED ORDER — ALBUTEROL SULFATE HFA 108 (90 BASE) MCG/ACT IN AERS: 2.0000 | INHALATION_SPRAY | Freq: Four times a day (QID) | RESPIRATORY_TRACT | Status: DC | PRN

## 2011-10-09 MED ORDER — DOXYCYCLINE HYCLATE 50 MG PO CAPS: 100.0000 mg | ORAL_CAPSULE | Freq: Two times a day (BID) | ORAL | Status: AC

## 2011-10-09 MED ORDER — FLUTICASONE-SALMETEROL 500-50 MCG/DOSE IN AEPB: 1.0000 | INHALATION_SPRAY | Freq: Two times a day (BID) | RESPIRATORY_TRACT | Status: DC

## 2011-10-09 MED ORDER — PREDNISONE 20 MG PO TABS: 40.0000 mg | ORAL_TABLET | Freq: Every day | ORAL | Status: AC

## 2011-10-09 MED ORDER — ALBUTEROL SULFATE (2.5 MG/3ML) 0.083% IN NEBU: 2.5000 mg | INHALATION_SOLUTION | Freq: Four times a day (QID) | RESPIRATORY_TRACT | Status: DC | PRN

## 2011-10-09 NOTE — Patient Instructions (Signed)
Follow up in 1 week if no improvement Schedule an appointment with your lung doctor

## 2011-10-09 NOTE — Progress Notes (Signed)
Patient ID: [name], female   DOB: 1946/05/05, 66 y.o.   MRN: [id number]  66 year old woman with past medical history of stage IV sarcoidosis, COPD, acid reflux and hypertension comes to the clinic complaining of Cough productive of yellowish sputum throughout the day but mostly at night and with exertion, wheezing and shortness of breath at rest, runny nose, sinus pressure, sore throat, feeling feverish without checking her temperature, chills and fatigue for past 4 days. No sick contacts, no travel, no chest pain, leg edema, weight change Physical exam   General Appearance:     Filed Vitals:   10/09/11 1401  BP: 153/90  Pulse: 95  Temp: 97.6 F (36.4 C)  TempSrc: Oral  Height: 5\' 5"  (1.651 m)  Weight: 119 lb 11.2 oz (54.296 kg)  SpO2: 99%     Alert, cooperative, no distress, appears stated age  Head:    Normocephalic, without obvious abnormality, atraumatic  Eyes:    PERRL, conjunctiva/corneas clear, EOM's intact, fundi    benign, both eyes       Neck:   Supple, symmetrical, trachea midline, no adenopathy;       thyroid:  No enlargement/tenderness/nodules; no carotid   bruit or JVD  Lungs:    bilateral diffuse wheezing, equal entry bilaterally , no crackles   Chest wall:    No tenderness or deformity  Heart:    Regular rate and rhythm, S1 and S2 normal, no murmur, rub   or gallop  Abdomen:     Soft, non-tender, bowel sounds active all four quadrants,    no masses, no organomegaly  Extremities:   Extremities normal, atraumatic, no cyanosis or edema  Pulses:   2+ and symmetric all extremities  Skin:   Skin color, texture, turgor normal, no rashes or lesions  Neurologic:  nonfocal grossly   Review of systems  Constitutional: Denies fever, chills, diaphoresis, appetite change and fatigue.  Respiratory: As per history of present illness Cardiovascular: Denies chest pain, palpitations and leg swelling.  Gastrointestinal: Denies nausea, vomiting, abdominal pain, diarrhea,  constipation, blood in stool and abdominal distention.  Skin: Denies pallor, rash and wound.  Neurological: Denies dizziness, light-headedness, numbness and headaches.

## 2011-10-09 NOTE — Assessment & Plan Note (Addendum)
Patient is having a COPD exacerbation. She also has a history of sarcoidosis she has been lost to followup by Dr. Teddy Spike office for past 2 years. He intended to follow nodules on her CT chest with yearly CTs to rule out cancer she has not got them. I would like to get a CT chest with contrast for her today but radiologically Department could not do it today. Slightly get a chest x-ray to rule out pneumonia. I will treat her with doxycycline, prednisone, nebulizer treatment at home and MDI. Also treat her with PPI for acid reflux Followup in one week. Refer her back to Dr. Shelle Iron at that visit if needed

## 2011-10-15 ENCOUNTER — Other Ambulatory Visit (HOSPITAL_COMMUNITY): Payer: Medicare Other

## 2011-12-17 ENCOUNTER — Other Ambulatory Visit: Payer: Self-pay | Admitting: *Deleted

## 2011-12-17 DIAGNOSIS — J449 Chronic obstructive pulmonary disease, unspecified: Secondary | ICD-10-CM

## 2011-12-17 MED ORDER — PANTOPRAZOLE SODIUM 40 MG PO TBEC: 40.0000 mg | DELAYED_RELEASE_TABLET | Freq: Every day | ORAL | Status: DC

## 2011-12-17 NOTE — Progress Notes (Signed)
The patient was seen by Dr. Talbot and not I.  He has left the practice and I am closing this encounter. 

## 2012-01-21 ENCOUNTER — Other Ambulatory Visit: Payer: Self-pay | Admitting: *Deleted

## 2012-01-21 DIAGNOSIS — J449 Chronic obstructive pulmonary disease, unspecified: Secondary | ICD-10-CM

## 2012-01-21 MED ORDER — METOPROLOL TARTRATE 25 MG PO TABS: 25.0000 mg | ORAL_TABLET | Freq: Two times a day (BID) | ORAL | Status: DC

## 2012-01-21 MED ORDER — PANTOPRAZOLE SODIUM 40 MG PO TBEC: 40.0000 mg | DELAYED_RELEASE_TABLET | Freq: Every day | ORAL | Status: DC

## 2012-01-21 MED ORDER — CHLORTHALIDONE 25 MG PO TABS: 25.0000 mg | ORAL_TABLET | Freq: Every day | ORAL | Status: DC

## 2012-01-21 NOTE — Telephone Encounter (Signed)
Needs appt and labs

## 2012-01-21 NOTE — Telephone Encounter (Signed)
Request for appointment and labs sent to front office

## 2012-03-02 ENCOUNTER — Ambulatory Visit (INDEPENDENT_AMBULATORY_CARE_PROVIDER_SITE_OTHER): Payer: Medicare Other | Admitting: Internal Medicine

## 2012-03-02 VITALS — BP 129/81 | HR 71 | Temp 97.6°F | Wt 121.5 lb

## 2012-03-02 DIAGNOSIS — I10 Essential (primary) hypertension: Secondary | ICD-10-CM

## 2012-03-02 DIAGNOSIS — Z139 Encounter for screening, unspecified: Secondary | ICD-10-CM | POA: Insufficient documentation

## 2012-03-02 DIAGNOSIS — J449 Chronic obstructive pulmonary disease, unspecified: Secondary | ICD-10-CM

## 2012-03-02 DIAGNOSIS — Z1231 Encounter for screening mammogram for malignant neoplasm of breast: Secondary | ICD-10-CM

## 2012-03-02 DIAGNOSIS — E876 Hypokalemia: Secondary | ICD-10-CM

## 2012-03-02 LAB — CBC
HCT: 36 % (ref 36.0–46.0)
MCHC: 33.3 g/dL (ref 30.0–36.0)
MCV: 80.7 fL (ref 78.0–100.0)
Platelets: 335 10*3/uL (ref 150–400)
RBC: 4.46 MIL/uL (ref 3.87–5.11)
RDW: 16 % — ABNORMAL HIGH (ref 11.5–15.5)
WBC: 4 10*3/uL (ref 4.0–10.5)

## 2012-03-02 MED ORDER — TIOTROPIUM BROMIDE MONOHYDRATE 18 MCG IN CAPS: 18.0000 ug | ORAL_CAPSULE | Freq: Every day | RESPIRATORY_TRACT | Status: DC

## 2012-03-02 MED ORDER — ONE-DAILY MULTI VITAMINS PO TABS: 1.0000 | ORAL_TABLET | Freq: Every day | ORAL | Status: DC

## 2012-03-02 MED ORDER — CETIRIZINE HCL 10 MG PO CAPS: ORAL_CAPSULE | ORAL | Status: DC

## 2012-03-02 MED ORDER — ALBUTEROL SULFATE (2.5 MG/3ML) 0.083% IN NEBU: 2.5000 mg | INHALATION_SOLUTION | Freq: Four times a day (QID) | RESPIRATORY_TRACT | Status: DC | PRN

## 2012-03-02 MED ORDER — METOPROLOL TARTRATE 25 MG PO TABS: 25.0000 mg | ORAL_TABLET | Freq: Two times a day (BID) | ORAL | Status: DC

## 2012-03-02 MED ORDER — FLUTICASONE-SALMETEROL 500-50 MCG/DOSE IN AEPB: 1.0000 | INHALATION_SPRAY | Freq: Two times a day (BID) | RESPIRATORY_TRACT | Status: DC

## 2012-03-02 MED ORDER — ALBUTEROL SULFATE HFA 108 (90 BASE) MCG/ACT IN AERS: 2.0000 | INHALATION_SPRAY | Freq: Four times a day (QID) | RESPIRATORY_TRACT | Status: DC | PRN

## 2012-03-02 MED ORDER — CHLORTHALIDONE 25 MG PO TABS: 25.0000 mg | ORAL_TABLET | Freq: Every day | ORAL | Status: DC

## 2012-03-02 MED ORDER — PANTOPRAZOLE SODIUM 40 MG PO TBEC: 40.0000 mg | DELAYED_RELEASE_TABLET | Freq: Every day | ORAL | Status: DC

## 2012-03-02 NOTE — Assessment & Plan Note (Signed)
Will schedule for follow up screening colonoscopy and mammogram

## 2012-03-02 NOTE — Progress Notes (Signed)
Patient ID: [name], female   DOB: 12/23/1945, 66 y.o.   MRN: [id number]

## 2012-03-02 NOTE — Patient Instructions (Addendum)
Start taking Zyrtec 10mg  one tablet daily Continue taking all of your medications Follow up in 6-12 months Make sure you go for your colonoscopy and mammogram

## 2012-03-02 NOTE — Progress Notes (Signed)
HPI:  [name] is a 66 yo woman with PMH of COPD, HTN, sarcoidosis presents today for routine follow up and medication refills.  She has been doing well and has no complaints except for allergy symptoms.  She states that her SOB is not more than normal and she is taking her medications as prescribed.  She has not had her repeat colonoscopy since 2002 and mammogram.  Denies any chest pain, N/V, abdominal pain.  She actually has weight gain intentionally and is happy with that.  ROS: as per HPI  PE: General: alert, well-developed, and cooperative to examination.  Lungs: normal respiratory effort, no accessory muscle use, normal breath sounds, no crackles, and mild  Expiratory wheezes bilaterally. Heart: normal rate, regular rhythm, no murmur, no gallop, and no rub.  Abdomen: soft, non-tender, normal bowel sounds, no distention, no guarding, no rebound tenderness Neuro: nonfocal

## 2012-03-02 NOTE — Assessment & Plan Note (Signed)
Stable, very mild wheezing on lung exam.  She may need to be seen by pulmonology for her sarcoidosis since I do not see any records from a pulmonologist.

## 2012-03-02 NOTE — Assessment & Plan Note (Signed)
BP was elevated on arrival but repeat BP was 129/81. -Will continue current medications

## 2012-03-03 LAB — COMPLETE METABOLIC PANEL WITH GFR
ALT: 8 U/L (ref 0–35)
AST: 17 U/L (ref 0–37)
Alkaline Phosphatase: 64 U/L (ref 39–117)
CO2: 26 mEq/L (ref 19–32)
Calcium: 10.1 mg/dL (ref 8.4–10.5)
Chloride: 97 mEq/L (ref 96–112)
Creat: 0.99 mg/dL (ref 0.50–1.10)
GFR, Est African American: 69 mL/min
Total Bilirubin: 0.2 mg/dL — ABNORMAL LOW (ref 0.3–1.2)

## 2012-03-03 LAB — LIPID PANEL
HDL: 81 mg/dL (ref >39)
LDL Cholesterol: 98 mg/dL (ref 0–99)
Total CHOL/HDL Ratio: 2.4 Ratio
Triglycerides: 78 mg/dL (ref <150)
VLDL: 16 mg/dL (ref 0–40)

## 2012-03-24 ENCOUNTER — Ambulatory Visit: Payer: Medicare Other

## 2012-03-30 ENCOUNTER — Ambulatory Visit: Payer: Medicare Other

## 2012-04-09 NOTE — Addendum Note (Signed)
Addended by: Neomia Dear on: 04/09/2012 05:43 PM   Modules accepted: Orders

## 2012-04-13 ENCOUNTER — Other Ambulatory Visit: Payer: Self-pay | Admitting: Internal Medicine

## 2012-04-13 DIAGNOSIS — M25552 Pain in left hip: Secondary | ICD-10-CM

## 2012-04-13 DIAGNOSIS — M25551 Pain in right hip: Secondary | ICD-10-CM

## 2012-04-23 ENCOUNTER — Encounter: Payer: Medicare Other | Admitting: Gastroenterology

## 2012-05-18 NOTE — Addendum Note (Signed)
Addended by: Neomia Dear on: 05/18/2012 02:46 PM   Modules accepted: Orders

## 2012-08-02 ENCOUNTER — Other Ambulatory Visit: Payer: Self-pay | Admitting: Internal Medicine

## 2012-08-24 ENCOUNTER — Other Ambulatory Visit: Payer: Self-pay | Admitting: Internal Medicine

## 2012-08-26 NOTE — Telephone Encounter (Signed)
This is the last refill for 1 month because patient will be discharged from our clinic and she will need to find another physician.

## 2012-09-01 ENCOUNTER — Other Ambulatory Visit: Payer: Self-pay | Admitting: Internal Medicine

## 2012-09-13 ENCOUNTER — Other Ambulatory Visit: Payer: Self-pay | Admitting: Internal Medicine

## 2012-09-13 NOTE — Telephone Encounter (Signed)
Will give 1 RF because both patient and husband are being discharged from [facility].  Patient will need to find new physician

## 2012-09-21 ENCOUNTER — Encounter: Payer: Medicare Other | Admitting: Internal Medicine

## 2012-10-11 ENCOUNTER — Other Ambulatory Visit: Payer: Self-pay | Admitting: Internal Medicine

## 2012-10-20 ENCOUNTER — Encounter: Payer: Self-pay | Admitting: Internal Medicine

## 2012-11-17 ENCOUNTER — Other Ambulatory Visit: Payer: Self-pay | Admitting: Internal Medicine

## 2012-12-20 ENCOUNTER — Other Ambulatory Visit: Payer: Self-pay | Admitting: Internal Medicine

## 2012-12-31 ENCOUNTER — Ambulatory Visit (INDEPENDENT_AMBULATORY_CARE_PROVIDER_SITE_OTHER): Payer: Medicare Other | Admitting: Internal Medicine

## 2012-12-31 ENCOUNTER — Encounter: Payer: Self-pay | Admitting: Internal Medicine

## 2012-12-31 VITALS — BP 151/86 | HR 95 | Temp 97.2°F | Ht 65.0 in | Wt 123.2 lb

## 2012-12-31 DIAGNOSIS — Z1211 Encounter for screening for malignant neoplasm of colon: Secondary | ICD-10-CM

## 2012-12-31 DIAGNOSIS — H269 Unspecified cataract: Secondary | ICD-10-CM

## 2012-12-31 DIAGNOSIS — Z1239 Encounter for other screening for malignant neoplasm of breast: Secondary | ICD-10-CM

## 2012-12-31 DIAGNOSIS — I10 Essential (primary) hypertension: Secondary | ICD-10-CM

## 2012-12-31 DIAGNOSIS — M129 Arthropathy, unspecified: Secondary | ICD-10-CM

## 2012-12-31 DIAGNOSIS — J309 Allergic rhinitis, unspecified: Secondary | ICD-10-CM

## 2012-12-31 DIAGNOSIS — J439 Emphysema, unspecified: Secondary | ICD-10-CM

## 2012-12-31 DIAGNOSIS — M1990 Unspecified osteoarthritis, unspecified site: Secondary | ICD-10-CM

## 2012-12-31 DIAGNOSIS — J438 Other emphysema: Secondary | ICD-10-CM

## 2012-12-31 DIAGNOSIS — E785 Hyperlipidemia, unspecified: Secondary | ICD-10-CM | POA: Insufficient documentation

## 2012-12-31 DIAGNOSIS — M810 Age-related osteoporosis without current pathological fracture: Secondary | ICD-10-CM

## 2012-12-31 HISTORY — DX: Age-related osteoporosis without current pathological fracture: M81.0

## 2012-12-31 MED ORDER — ALENDRONATE SODIUM 70 MG PO TABS: 70.0000 mg | ORAL_TABLET | ORAL | Status: DC

## 2012-12-31 MED ORDER — CHLORTHALIDONE 50 MG PO TABS: 50.0000 mg | ORAL_TABLET | Freq: Every day | ORAL | Status: DC

## 2012-12-31 MED ORDER — FLUTICASONE-SALMETEROL 500-50 MCG/DOSE IN AEPB: 1.0000 | INHALATION_SPRAY | Freq: Two times a day (BID) | RESPIRATORY_TRACT | Status: DC

## 2012-12-31 MED ORDER — LORATADINE 10 MG PO TABS: 10.0000 mg | ORAL_TABLET | Freq: Every day | ORAL | Status: DC

## 2012-12-31 MED ORDER — CALCIUM-VITAMIN D 500-200 MG-UNIT PO TABS: 1.0000 | ORAL_TABLET | Freq: Two times a day (BID) | ORAL | Status: DC

## 2012-12-31 MED ORDER — ALBUTEROL SULFATE HFA 108 (90 BASE) MCG/ACT IN AERS: 2.0000 | INHALATION_SPRAY | Freq: Four times a day (QID) | RESPIRATORY_TRACT | Status: DC | PRN

## 2012-12-31 MED ORDER — PRAVASTATIN SODIUM 40 MG PO TABS: 40.0000 mg | ORAL_TABLET | Freq: Every evening | ORAL | Status: DC

## 2012-12-31 NOTE — Assessment & Plan Note (Signed)
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 If conservative management fails to improve symptoms sufficiently, refer back to orthopedics for right hip arthroplasty revision and left hip arthroplasty. - OTC NSAIDs and acetaminophen for analgesia - Physical therapy referral

## 2012-12-31 NOTE — Assessment & Plan Note (Signed)
Symptoms consistent with allergic rhinitis. Nothing to suggest infectious process with history and exam today. She was previously on cetirizine 10 mg daily. - Start loratadine 10 mg daily

## 2012-12-31 NOTE — Assessment & Plan Note (Signed)
Left femur had T score -2.5 on DEXA scan 02/26/2009. Does not drink or smoke. No history of fracture. No history of prolonged steroid use. - Start calicum-vitamin D 500mg -200unit BID - Start alendronate 70 mg weekly

## 2012-12-31 NOTE — Patient Instructions (Addendum)
Increase CHLORTHALIDONE to 2 tablets (50 mg) daily.  When you run out, fill prescription for 50 mg tablets and take 1 daily.  Start taking PRAVASTATIN 40 mg daily.  Start taking calcium and vitamin D supplement.  You should be taking 1 tablet twice a day, containing 500 mg of calcium and 200 units of vitamin D for a total daily dose of 1000 mg calcium and 400 units vitamin D.  Start taking alendronate 70 mg once a week.  Start taking loratadine 10 mg daily for allergies.  Wait to hear for an appointment with the eye doctor.  We are referring you for a screening colonoscopy.  Get a mammogram.   See if your insurance will cover a zoster vaccine. Take the prescription to any pharmacy.    Treatment Goals:  Goals (1 Years of Data) as of 12/31/12         As of Today As of Today 03/02/12 03/02/12 10/09/11     Blood Pressure    . Blood Pressure < 140/90  151/86 159/80 129/81 178/82 153/90     Lifestyle    . Quit smoking / using tobacco            Progress Toward Treatment Goals:  Treatment Goal 12/31/2012  Blood pressure deteriorated  Prevent falls at goal    Self Care Goals & Plans:       Care Management & Community Referrals:  Referral 12/31/2012  Referrals made for care management support other (see comment)  Referrals made to community resources exercise/physical therapy

## 2012-12-31 NOTE — Assessment & Plan Note (Addendum)
BP Readings from Last 3 Encounters:  12/31/12 151/86  03/02/12 129/81  10/09/11 153/90    Lab Results  Component Value Date   NA 133* 12/31/2012   K 4.1 12/31/2012   CREATININE 0.85 12/31/2012    Assessment: Blood pressure control: mildly elevated Progress toward BP goal:  deteriorated  Plan: Medications:  Increase chlorthalidone from 25 mg daily to 50 mg daily, continue metoprolol tartrate 25 mg twice a day Educational resources provided: handout Other plans: BMET today, see above for results

## 2012-12-31 NOTE — Progress Notes (Signed)
  Subjective:    Patient ID: [name], female    DOB: 06-Sep-1945, 67 y.o.   MRN: [id number]  HPI:  This is a 67 year old woman with hypertension, COPD, cataracts, and osteoarthritis of the hips; presenting to clinic for routine followup of these issues and acute complaint of "allergies".  Allergies Patient reports watery and itchy eyes, scratchy throat, runny nose, and sinonasal congestion every year around the same time. She is just now coming over what she thinks was a cold with cough, sputum production, and dyspnea; these are now resolved. She denies fevers and chills.  COPD Controlled on fluticasone-salmeterol and PRN albuterol. She has been taking fluticasone-salmeterol twice a day only when she felt chest tightness. She takes albuterol about once a day, usually in the morning.  Cataracts History of right eye cataract that was corrected surgically. She still has a left eye cataract that is causing worsening symptoms described as "seeing spots". No eye pain.  Osteoarthritis Severe in both hips. 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   Review of Systems  Constitutional: Negative for fever and chills.  HENT: Negative for ear pain.   Eyes: Positive for visual disturbance (seeing spots).  Respiratory: Negative for shortness of breath.   Cardiovascular: Negative for chest pain.  Gastrointestinal: Negative for abdominal pain and blood in stool.  Genitourinary: Negative for dysuria and hematuria.  Musculoskeletal: Positive for joint swelling (hips).  Neurological: Negative for dizziness and headaches.  Hematological: Negative for adenopathy.    Current Medications: 1. Chlorthalidone 25 mg daily 2. Metoprolol tartrate 25 mg twice a day 3. Albuterol MDI and nebulized when necessary 4. Fluticasone-salmeterol 500-50 one puff twice a day 5. Pantoprazole 40 mg daily     Objective:   Physical Exam  GENERAL: well developed, well nourished; no acute distress HEAD:  atraumatic, normocephalic EYES: pupils equal, round and reactive; sclera anicteric; normal conjunctiva EARS: external ears normal, canals patent, and TMs normal bilaterally NOSE: normal nasal mucosa, no erythema or drainage MOUTH/THROAT: oropharynx clear, moist mucous membranes, pink gingiva NECK: supple, thyroid normal in size and without palpable nodules LYMPH: no cervical or supraclavicular lymphadenopathy LUNGS: clear to auscultation bilaterally, normal work of breathing HEART: normal rate and regular rhythm; normal S1 and S2 without S3 or S4; no murmurs, rubs, or clicks PULSES: radial 2+ and symmetric ABDOMEN: soft, non-tender, normal bowel sounds MSK: tenderness with palpation, flexion, and extension of the hip joints bilaterally SKIN: warm, dry, intact, normal turgor, no rashes EXTREMITIES: no peripheral edema, clubbing, or cyanosis PSYCH: patient is alert and oriented, mood and affect are normal and congruent, thought content is normal without delusions, thought process is linear, speech is normal and non-pressured, behavior is normal   Filed Vitals:   12/31/12 1433  BP: 151/86  Pulse: 95  Temp:     BP Readings from Last 3 Encounters:  12/31/12 151/86  03/02/12 129/81  10/09/11 153/90    BMET Component Value Date/Time   NA 133* 12/31/2012 1514   K 4.1 12/31/2012 1514   CL 95* 12/31/2012 1514   CO2 28 12/31/2012 1514   GLUCOSE 89 12/31/2012 1514   BUN 12 12/31/2012 1514   CREATININE 0.85 12/31/2012 1514   CALCIUM 9.9 12/31/2012 1514   GFRAA 83 12/31/2012 1514         Assessment & Plan:    Prescription given for Zostavax. Appointment made for cream mammography. Appointment made for screening colonoscopy.

## 2012-12-31 NOTE — Assessment & Plan Note (Signed)
Lipid Panel  Component Value Date/Time   CHOL 195 03/02/2012 1436   TRIG 78 03/02/2012 1436   HDL 81 03/02/2012 1436   LDLCALC 98 03/02/2012 1436   Hemoglobin A1c Component Value Date   HGBA1C 5.8 06/12/2010   HGBA1C 5.6 06/11/2010   10 years CVD risk is 10%. Threshold for starting moderate-intensity statin therapy is 7.5%.  Former smoker. Patient reports an intolerance to aspirin (prior gastric ulcer). At her current risk level, she would be a candidate for aspirin for primary prevention until she turned 70. Patient declined aspirin this visit. - Start pravastatin 40 mg daily

## 2012-12-31 NOTE — Assessment & Plan Note (Signed)
History of right cataract removal. Left cataract was not removed at that time. Symptoms now worsening. - Ophthalmology referral

## 2013-01-01 LAB — BASIC METABOLIC PANEL WITH GFR
BUN: 12 mg/dL (ref 6–23)
CO2: 28 mEq/L (ref 19–32)
Chloride: 95 mEq/L — ABNORMAL LOW (ref 96–112)
GFR, Est African American: 83 mL/min
Glucose, Bld: 89 mg/dL (ref 70–99)
Potassium: 4.1 mEq/L (ref 3.5–5.3)
Sodium: 133 mEq/L — ABNORMAL LOW (ref 135–145)

## 2013-01-04 NOTE — Progress Notes (Signed)
Case discussed with Dr. Earlene Plater  at the time of the visit, immediately after the resident saw the patient.  I reviewed the resident's history and exam and pertinent patient test results.  I agree with the assessment, diagnosis and plan of care documented in the resident's note.

## 2013-01-06 ENCOUNTER — Ambulatory Visit (HOSPITAL_COMMUNITY)
Admission: RE | Admit: 2013-01-06 | Discharge: 2013-01-06 | Disposition: A | Discharge status: Home / Self Care | Payer: Medicare Other | Source: Ambulatory Visit | Attending: Internal Medicine | Admitting: Internal Medicine

## 2013-01-06 DIAGNOSIS — M810 Age-related osteoporosis without current pathological fracture: Secondary | ICD-10-CM

## 2013-01-06 DIAGNOSIS — Z1231 Encounter for screening mammogram for malignant neoplasm of breast: Secondary | ICD-10-CM | POA: Insufficient documentation

## 2013-01-15 ENCOUNTER — Other Ambulatory Visit: Payer: Self-pay | Admitting: Internal Medicine

## 2013-01-17 ENCOUNTER — Ambulatory Visit: Payer: Medicare Other | Admitting: Physical Therapy

## 2013-02-03 ENCOUNTER — Ambulatory Visit: Payer: Medicare Other | Attending: Internal Medicine | Admitting: Physical Therapy

## 2013-02-03 NOTE — Addendum Note (Signed)
Addended by: Neomia Dear on: 02/03/2013 06:48 PM   Modules accepted: Orders

## 2013-02-14 ENCOUNTER — Encounter: Payer: Self-pay | Admitting: *Deleted

## 2013-02-15 ENCOUNTER — Telehealth: Payer: Self-pay | Admitting: *Deleted

## 2013-02-15 NOTE — Telephone Encounter (Signed)
I attempted to reach this pt again to schedule a PV, but no answer. LMOM.  Pt didn't call back by 5 p.m. So colonoscopy cancelled

## 2013-02-18 ENCOUNTER — Other Ambulatory Visit: Payer: Self-pay | Admitting: Internal Medicine

## 2013-02-18 NOTE — Telephone Encounter (Signed)
Needs Aug or Sept appt with PCP

## 2013-02-18 NOTE — Telephone Encounter (Signed)
Message sent to front desk to schedule pt an appt. 

## 2013-02-21 ENCOUNTER — Encounter: Payer: Self-pay | Admitting: Internal Medicine

## 2013-02-25 ENCOUNTER — Encounter: Payer: Medicare Other | Admitting: Gastroenterology

## 2013-02-25 NOTE — Addendum Note (Signed)
Addended by: Neomia Dear on: 02/25/2013 06:37 PM   Modules accepted: Orders

## 2013-03-31 ENCOUNTER — Encounter: Payer: Medicare Other | Admitting: Internal Medicine

## 2013-04-13 ENCOUNTER — Encounter: Payer: Self-pay | Admitting: Internal Medicine

## 2013-04-15 ENCOUNTER — Ambulatory Visit (INDEPENDENT_AMBULATORY_CARE_PROVIDER_SITE_OTHER): Payer: Medicare Other | Admitting: Internal Medicine

## 2013-04-15 ENCOUNTER — Encounter: Payer: Self-pay | Admitting: Internal Medicine

## 2013-04-15 VITALS — BP 140/85 | HR 98 | Temp 97.3°F | Ht 65.0 in | Wt 125.5 lb

## 2013-04-15 DIAGNOSIS — H269 Unspecified cataract: Secondary | ICD-10-CM

## 2013-04-15 DIAGNOSIS — M810 Age-related osteoporosis without current pathological fracture: Secondary | ICD-10-CM

## 2013-04-15 DIAGNOSIS — I10 Essential (primary) hypertension: Secondary | ICD-10-CM

## 2013-04-15 DIAGNOSIS — M169 Osteoarthritis of hip, unspecified: Secondary | ICD-10-CM

## 2013-04-15 DIAGNOSIS — J439 Emphysema, unspecified: Secondary | ICD-10-CM

## 2013-04-15 DIAGNOSIS — J438 Other emphysema: Secondary | ICD-10-CM

## 2013-04-15 DIAGNOSIS — J309 Allergic rhinitis, unspecified: Secondary | ICD-10-CM

## 2013-04-15 DIAGNOSIS — Z23 Encounter for immunization: Secondary | ICD-10-CM

## 2013-04-15 DIAGNOSIS — M160 Bilateral primary osteoarthritis of hip: Secondary | ICD-10-CM

## 2013-04-15 DIAGNOSIS — E785 Hyperlipidemia, unspecified: Secondary | ICD-10-CM

## 2013-04-15 MED ORDER — ALBUTEROL SULFATE (2.5 MG/3ML) 0.083% IN NEBU: 2.5000 mg | INHALATION_SOLUTION | Freq: Four times a day (QID) | RESPIRATORY_TRACT | Status: DC | PRN

## 2013-04-15 MED ORDER — CETIRIZINE HCL 10 MG PO TABS: 10.0000 mg | ORAL_TABLET | Freq: Every day | ORAL | Status: DC

## 2013-04-15 MED ORDER — METOPROLOL TARTRATE 25 MG PO TABS: ORAL_TABLET | ORAL | Status: DC

## 2013-04-15 MED ORDER — FLUTICASONE-SALMETEROL 500-50 MCG/DOSE IN AEPB: 1.0000 | INHALATION_SPRAY | Freq: Two times a day (BID) | RESPIRATORY_TRACT | Status: DC

## 2013-04-15 NOTE — Patient Instructions (Addendum)
General Instructions: 1. Please make an appointment for 3 months. 2. Please take all medications as prescribed.  3. If you have worsening of your symptoms or new symptoms arise, please call the clinic ([phone]), or go to the ER immediately if symptoms are severe.  You have done great job in taking all your medications. I appreciate it very much. Please continue doing that.  Treatment Goals:  Goals (1 Years of Data) as of 04/15/13         As of Today 12/31/12 12/31/12 03/02/12     Blood Pressure    . Blood Pressure < 140/90  140/85 151/86 159/80 129/81     Result Component    . LDL CALC < 130     98      Progress Toward Treatment Goals:  Treatment Goal 04/15/2013  Blood pressure unchanged  Prevent falls -    Self Care Goals & Plans:  Self Care Goal 04/15/2013  Manage my medications take my medicines as prescribed; bring my medications to every visit  Monitor my health keep track of my blood pressure  Eat healthy foods -  Be physically active find an activity I enjoy       Care Management & Community Referrals:  Referral 04/15/2013  Referrals made for care management support none needed  Referrals made to community resources -

## 2013-04-16 NOTE — Assessment & Plan Note (Signed)
Patient still complaining of right hip pain. She is s/p R hip replacement which still causes her pain and has started to affect her left hip as well. She takes Tylenol for pain which gives her moderate relief. She was given a referral for PT last visit but never went. -PT referral re-sent, patient encouraged to go in order to help her strength w/ gait.

## 2013-04-16 NOTE — Assessment & Plan Note (Signed)
No active issues at this time, however, the patient was encouraged to see Dr. Alden Hipp regarding cataract surgery for her left eye as the right has already been performed.

## 2013-04-16 NOTE — Assessment & Plan Note (Signed)
No active issues at this time. Continue Alendronate and Vit D/calcium.

## 2013-04-16 NOTE — Progress Notes (Signed)
Subjective:   Patient ID: [name] female   DOB: 12/04/1945 67 y.o.   MRN: [id number]  HPI: [name] is a 67 y.o. F w/ PMHx of HTN, HLD, COPD, OA, osteoporosis, cataracts, and GERD, presents to the clinic today for a follow-up visit. She has very few complaints today and feels good recently. She does complain of hip, knee, and ankle pain, which is an ongoing issue for her She takes OTC tylenol for pain, which does seem to give her some relief. During her last visit on 12/31/12, she was given a referral for PT to increase strength and mobility, however she never went. Today she also complains of ongoing allergic rhinitis despite taking Claritin since her last visit. Otherwise, she denies any SOB, chest pain, lightheadedness, dizziness, nausea, vomiting, diarrhea, constipation, or dysuria.  Past Medical History  Diagnosis Date  . Cataract of both eyes 01/18/2009    Right surgically repaired  . Sarcoidosis 02/05/2009    PFTs 03/30/2009: FVC 67%, FEV1 48%, FEV1/FVC 52%, TLC 104%, DLCO 71%.  . Depression 06/08/2007  . Essential hypertension, benign 06/08/2007  . 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  Marland Kitchen GERD 01/18/2009  . 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 Osteoporosis 12/31/2012    Left femur had T score -2.5 on DEXA scan 02/26/2009.    Current Outpatient Prescriptions  Medication Sig Dispense Refill  . albuterol (PROVENTIL) (2.5 MG/3ML) 0.083% nebulizer solution Take 3 mLs (2.5 mg total) by nebulization every 6 (six) hours as needed. As needed for SOB  75 mL  3  . albuterol (VENTOLIN HFA) 108 (90 BASE) MCG/ACT inhaler Inhale 2 puffs into the lungs every 6 (six) hours as needed.  1 Inhaler  6  . alendronate (FOSAMAX) 70 MG tablet Take 1 tablet (70 mg total) by mouth every 7 (seven) days. Take with a full glass of water on an empty stomach.  4 tablet  11  . Calcium Carbonate-Vitamin D (CALCIUM-VITAMIN D)  500-200 MG-UNIT per tablet Take 1 tablet by mouth 2 (two) times daily with a meal.  60 tablet  11  . Calcium Carbonate-Vitamin D (CALCIUM-VITAMIN D) 500-200 MG-UNIT per tablet Take 1 tablet by mouth 2 (two) times daily with a meal.  60 tablet  0  . calcium-vitamin D (OS-CAL 500 + D) 500-200 MG-UNIT per tablet Take 1 tablet by mouth 2 (two) times daily.  60 tablet  11  . cetirizine (ZYRTEC) 10 MG tablet Take 1 tablet (10 mg total) by mouth daily.  30 tablet  2  . chlorthalidone (HYGROTON) 50 MG tablet Take 1 tablet (50 mg total) by mouth daily.  30 tablet  11  . Fluticasone-Salmeterol (ADVAIR DISKUS) 500-50 MCG/DOSE AEPB Inhale 1 puff into the lungs every 12 (twelve) hours.  60 each  6  . metoprolol tartrate (LOPRESSOR) 25 MG tablet TAKE ONE TABLET BY MOUTH TWICE DAILY  60 tablet  2  . pantoprazole (PROTONIX) 40 MG tablet TAKE 1 TABLET BY MOUTH DAILY  30 tablet  5  . pravastatin (PRAVACHOL) 40 MG tablet Take 1 tablet (40 mg total) by mouth every evening.  30 tablet  11   No current facility-administered medications for this visit.   Family History  Problem Relation Age of Onset  . Cancer Mother   . Heart disease Mother   . Stroke Father   . Cancer Sister   . Heart disease Sister   .  Asthma Daughter   . Cancer Maternal Aunt   . Cancer Other   . Diabetes Other   . Hypertension Other    History   Social History  . Marital Status: Married    Spouse Name: N/A    Number of Children: N/A  . Years of Education: N/A   Social History Main Topics  . Smoking status: Former Smoker -- 0.25 packs/day for 30 years    Quit date: 05/11/2010  . Smokeless tobacco: None  . Alcohol Use: Yes     Comment: Occasionally  . Drug Use: No  . Sexual Activity: None   Other Topics Concern  . None   Social History Narrative   Married with 4 children. Retired, previously worked as a Naval architect.   Review of Systems: General: Denies fever, chills, diaphoresis, appetite change and fatigue.    HEENT: Positive for cataracts. Denies recent change in vision, eye pain, redness, hearing loss, congestion, sore throat, rhinorrhea, sneezing, mouth sores, trouble swallowing, neck pain, neck stiffness and tinnitus.   Respiratory: Denies SOB, DOE, cough, chest tightness, and wheezing.   Cardiovascular: Denies chest pain, palpitations and leg swelling.  Gastrointestinal: Denies nausea, vomiting, abdominal pain, diarrhea, constipation, blood in stool and abdominal distention.  Genitourinary: Denies dysuria, urgency, frequency, hematuria, flank pain and difficulty urinating.  Endocrine: Denies hot or cold intolerance, sweats, polyuria, polydipsia. Musculoskeletal: Positive for joint pain (hips, knees, ankles, b/l). Walks with a cane. Denies myalgias, back pain, joint swelling, and gait problem.  Skin: Positive for rash, on back and arms. Denies pallor, rash and wounds.  Neurological: Denies dizziness, seizures, syncope, weakness, lightheadedness, numbness and headaches.  Hematological: Denies adenopathy,easy bruising, personal or family bleeding history.  Psychiatric/Behavioral: Denies mood changes, confusion, nervousness, sleep disturbance and agitation.  Objective:  Physical Exam: Filed Vitals:   04/15/13 1346  BP: 140/85  Pulse: 98  Temp: 97.3 F (36.3 C)  TempSrc: Oral  Height: 5\' 5"  (1.651 m)  Weight: 125 lb 8 oz (56.926 kg)  SpO2: 100%   General: Vital signs reviewed.  Patient is a well-developed and well-nourished, in no acute distress and cooperative with exam. Alert and oriented x3.  Head: Normocephalic and atraumatic. Nose: No erythema or drainage noted.  Turbinates normal. Mouth: No erythema, exudates, sores, or ulcerations. Moist mucus membranes. Eyes: PERRL, EOMI, conjunctivae normal, No scleral icterus.  Neck: Supple, trachea midline, normal ROM, No JVD, masses, thyromegaly, or carotid bruit present.  Cardiovascular: RRR, S1 normal, S2 normal, no murmurs, gallops, or  rubs. Pulmonary/Chest: Normal respiratory effort, CTAB, no wheezes, rales, or rhonchi. Abdominal: Soft. Non-tender, non-distended, bowel sounds are normal, no masses, organomegaly, or guarding present.  Musculoskeletal: No joint deformities, erythema, or stiffness, ROM full and no nontender. Extremities: No swelling or edema,  pulses symmetric and intact bilaterally. No cyanosis or clubbing. Neurological: A&O x3, Strength is normal and symmetric bilaterally, cranial nerve II-XII are grossly intact, no focal motor deficit, sensory intact to light touch bilaterally.  Skin: Warm, dry and intact. Healing rash present on back/shoulders and forearms. No erythema, purulence, or excoriations. Psychiatric: Normal mood and affect. speech and behavior is normal. Cognition and memory are normal.   Assessment & Plan:   Please see problem-based assessment and plan.  Patient was scheduled previously for Colonoscopy in July, however she canceled her appointment. She was encouraged to call and reschedule in the very near future.

## 2013-04-16 NOTE — Assessment & Plan Note (Signed)
Well controlled today. Denies any issues recently w/ SOB, or cough. No recent exacerbations.  -Given refills today for Advair and Albuterol nebs.

## 2013-04-16 NOTE — Assessment & Plan Note (Signed)
Patient still complains of seasonal allergies despite starting Loratadine at her last appointment in June.  -Switched to Cetirizine 10 mg qd for now as patient says this has worked in the past. If ineffective at next visit, will switch to nasal spray for allergic rhinitis.

## 2013-04-16 NOTE — Assessment & Plan Note (Signed)
Blood pressure slightly elevated today at 140/85. Takes chlorthalidone 50 mg, and Metoprolol 25 mg. She is asking for Metoprolol refill today and presented to the clinic w/ an empty bottle, therefore it is likely she has not taken her metoprolol today. -Continue w/ current regimen. If elevated at next appointment and medication compliance, will consider changing regimen.

## 2013-04-16 NOTE — Assessment & Plan Note (Signed)
Continue Pravastatin 40 mg. Repeat Lipid panel at next appointment.

## 2013-04-17 NOTE — Progress Notes (Signed)
I saw and evaluated the patient.  I personally confirmed the key portions of the history and exam documented by Dr. Jones and I reviewed pertinent patient test results.  The assessment, diagnosis, and plan were formulated together and I agree with the documentation in the resident's note.   

## 2013-04-27 ENCOUNTER — Telehealth: Payer: Self-pay | Admitting: *Deleted

## 2013-04-27 NOTE — Telephone Encounter (Signed)
Fax from pharmacy requesting PA for pt's Advair 500/50.  Contacted pt's insurance at [phone] and informed them of pt's dx COPD with emphysema and informed them that pt had been on this particular medication for quite sometime.  Medication approved until 08/10/2013. Pharmacy aware.Kingsley Spittle Cassady9/17/201410:57 AM

## 2013-05-04 ENCOUNTER — Encounter: Payer: Self-pay | Admitting: *Deleted

## 2013-06-03 NOTE — Addendum Note (Signed)
Addended by: Neomia Dear on: 06/03/2013 06:50 AM   Modules accepted: Orders

## 2013-08-05 ENCOUNTER — Other Ambulatory Visit: Payer: Self-pay | Admitting: Internal Medicine

## 2013-09-08 ENCOUNTER — Other Ambulatory Visit: Payer: Self-pay | Admitting: Internal Medicine

## 2013-10-08 ENCOUNTER — Other Ambulatory Visit: Payer: Self-pay | Admitting: Internal Medicine

## 2013-10-20 ENCOUNTER — Telehealth: Payer: Self-pay | Admitting: Internal Medicine

## 2013-10-20 NOTE — Telephone Encounter (Signed)
Received a page to call [phone] this evening on the night on call pager. No answer on call back, call went to voicemail. I left a message for them to call back if needed.

## 2014-01-20 ENCOUNTER — Other Ambulatory Visit: Payer: Self-pay | Admitting: Internal Medicine

## 2014-01-26 ENCOUNTER — Other Ambulatory Visit: Payer: Self-pay | Admitting: *Deleted

## 2014-01-26 DIAGNOSIS — I10 Essential (primary) hypertension: Secondary | ICD-10-CM

## 2014-01-30 MED ORDER — CHLORTHALIDONE 50 MG PO TABS: 50.0000 mg | ORAL_TABLET | Freq: Every day | ORAL | Status: DC

## 2014-01-30 NOTE — Telephone Encounter (Signed)
Has aug appt with PCP 

## 2014-03-14 ENCOUNTER — Encounter: Payer: Medicare Other | Admitting: Internal Medicine

## 2014-03-24 ENCOUNTER — Other Ambulatory Visit: Payer: Self-pay | Admitting: Internal Medicine

## 2014-04-04 ENCOUNTER — Other Ambulatory Visit: Payer: Self-pay | Admitting: Internal Medicine

## 2014-04-11 ENCOUNTER — Ambulatory Visit: Payer: Medicare Other

## 2014-04-11 DIAGNOSIS — Z23 Encounter for immunization: Secondary | ICD-10-CM

## 2014-04-14 ENCOUNTER — Other Ambulatory Visit: Payer: Self-pay | Admitting: Internal Medicine

## 2014-06-15 ENCOUNTER — Encounter: Payer: Self-pay | Admitting: Internal Medicine

## 2014-06-15 ENCOUNTER — Encounter: Payer: Medicare Other | Admitting: Internal Medicine

## 2014-06-20 ENCOUNTER — Other Ambulatory Visit: Payer: Self-pay | Admitting: *Deleted

## 2014-06-20 DIAGNOSIS — J439 Emphysema, unspecified: Secondary | ICD-10-CM

## 2014-06-20 DIAGNOSIS — I10 Essential (primary) hypertension: Secondary | ICD-10-CM

## 2014-06-21 MED ORDER — CHLORTHALIDONE 50 MG PO TABS: 50.0000 mg | ORAL_TABLET | Freq: Every day | ORAL | Status: DC

## 2014-06-21 MED ORDER — ALBUTEROL SULFATE HFA 108 (90 BASE) MCG/ACT IN AERS: 2.0000 | INHALATION_SPRAY | Freq: Four times a day (QID) | RESPIRATORY_TRACT | Status: DC | PRN

## 2014-06-21 MED ORDER — FLUTICASONE-SALMETEROL 500-50 MCG/DOSE IN AEPB: 1.0000 | INHALATION_SPRAY | Freq: Two times a day (BID) | RESPIRATORY_TRACT | Status: DC

## 2014-07-11 ENCOUNTER — Ambulatory Visit: Payer: Medicare Other | Admitting: Internal Medicine

## 2014-09-05 ENCOUNTER — Encounter: Payer: Medicare Other | Admitting: Internal Medicine

## 2014-09-11 ENCOUNTER — Telehealth: Payer: Self-pay | Admitting: Internal Medicine

## 2014-09-11 NOTE — Telephone Encounter (Signed)
Call to patient to confirm appointment for 09/12/14 at 2:45. Patient confirmed.

## 2014-09-12 ENCOUNTER — Encounter: Payer: Self-pay | Admitting: Internal Medicine

## 2014-09-12 ENCOUNTER — Ambulatory Visit (INDEPENDENT_AMBULATORY_CARE_PROVIDER_SITE_OTHER): Payer: Medicare HMO | Admitting: Internal Medicine

## 2014-09-12 VITALS — BP 174/74 | HR 81 | Temp 98.0°F | Ht 65.5 in | Wt 122.9 lb

## 2014-09-12 DIAGNOSIS — M810 Age-related osteoporosis without current pathological fracture: Secondary | ICD-10-CM

## 2014-09-12 DIAGNOSIS — J439 Emphysema, unspecified: Secondary | ICD-10-CM

## 2014-09-12 DIAGNOSIS — J302 Other seasonal allergic rhinitis: Secondary | ICD-10-CM

## 2014-09-12 DIAGNOSIS — J309 Allergic rhinitis, unspecified: Secondary | ICD-10-CM

## 2014-09-12 DIAGNOSIS — K219 Gastro-esophageal reflux disease without esophagitis: Secondary | ICD-10-CM

## 2014-09-12 DIAGNOSIS — Z452 Encounter for adjustment and management of vascular access device: Secondary | ICD-10-CM | POA: Insufficient documentation

## 2014-09-12 DIAGNOSIS — E785 Hyperlipidemia, unspecified: Secondary | ICD-10-CM

## 2014-09-12 DIAGNOSIS — Z0000 Encounter for general adult medical examination without abnormal findings: Secondary | ICD-10-CM

## 2014-09-12 DIAGNOSIS — I10 Essential (primary) hypertension: Secondary | ICD-10-CM

## 2014-09-12 LAB — BASIC METABOLIC PANEL WITH GFR
BUN: 14 mg/dL (ref 6–23)
CO2: 25 mEq/L (ref 19–32)
CREATININE: 0.96 mg/dL (ref 0.50–1.10)
Calcium: 9.2 mg/dL (ref 8.4–10.5)
Chloride: 101 mEq/L (ref 96–112)
GFR, EST AFRICAN AMERICAN: 70 mL/min
GFR, EST NON AFRICAN AMERICAN: 61 mL/min
GLUCOSE: 67 mg/dL — AB (ref 70–99)
POTASSIUM: 4.1 meq/L (ref 3.5–5.3)
Sodium: 137 mEq/L (ref 135–145)

## 2014-09-12 LAB — LIPID PANEL
Cholesterol: 169 mg/dL (ref 0–200)
HDL: 56 mg/dL (ref >39)
LDL Cholesterol: 86 mg/dL (ref 0–99)
Total CHOL/HDL Ratio: 3 Ratio
Triglycerides: 136 mg/dL (ref <150)
VLDL: 27 mg/dL (ref 0–40)

## 2014-09-12 MED ORDER — METOPROLOL SUCCINATE ER 50 MG PO TB24: 50.0000 mg | ORAL_TABLET | Freq: Every day | ORAL | Status: DC

## 2014-09-12 MED ORDER — LORATADINE 10 MG PO TABS: 10.0000 mg | ORAL_TABLET | Freq: Every day | ORAL | Status: DC

## 2014-09-12 MED ORDER — ALBUTEROL SULFATE HFA 108 (90 BASE) MCG/ACT IN AERS: 2.0000 | INHALATION_SPRAY | Freq: Four times a day (QID) | RESPIRATORY_TRACT | Status: DC | PRN

## 2014-09-12 MED ORDER — PRAVASTATIN SODIUM 40 MG PO TABS: 40.0000 mg | ORAL_TABLET | Freq: Every evening | ORAL | Status: DC

## 2014-09-12 MED ORDER — CHLORTHALIDONE 50 MG PO TABS: 50.0000 mg | ORAL_TABLET | Freq: Every day | ORAL | Status: DC

## 2014-09-12 MED ORDER — PANTOPRAZOLE SODIUM 40 MG PO TBEC: 40.0000 mg | DELAYED_RELEASE_TABLET | Freq: Every day | ORAL | Status: DC

## 2014-09-12 MED ORDER — ALENDRONATE SODIUM 70 MG PO TABS: 70.0000 mg | ORAL_TABLET | ORAL | Status: DC

## 2014-09-12 MED ORDER — FLUTICASONE-SALMETEROL 500-50 MCG/DOSE IN AEPB: 1.0000 | INHALATION_SPRAY | Freq: Two times a day (BID) | RESPIRATORY_TRACT | Status: DC

## 2014-09-12 NOTE — Patient Instructions (Signed)
General Instructions:  1. Please schedule a follow up appointment for 3 months.   2. Please take all medications as prescribed.   Please take Toprol XL 50 mg daily INSTEAD of Lopressor (metoprolol).  Start taking Fosamax (alendronate) 70 mg once weekly.   Restart Pravachol for cholesterol.  3. If you have worsening of your symptoms or new symptoms arise, please call the clinic ([phone]), or go to the ER immediately if symptoms are severe.   Thank you for bringing your medicines today. This helps [place] keep you safe from mistakes.   Progress Toward Treatment Goals:  Treatment Goal 09/12/2014  Blood pressure deteriorated  Prevent falls -    Self Care Goals & Plans:  Self Care Goal 09/12/2014  Manage my medications take my medicines as prescribed; bring my medications to every visit; refill my medications on time  Monitor my health -  Eat healthy foods drink diet soda or water instead of juice or soda; eat more vegetables; eat foods that are low in salt; eat baked foods instead of fried foods; eat fruit for snacks and desserts  Be physically active -  Meeting treatment goals maintain the current self-care plan    No flowsheet data found.   Care Management & Community Referrals:  Referral 09/12/2014  Referrals made for care management support none needed  Referrals made to community resources none

## 2014-09-12 NOTE — Progress Notes (Signed)
Subjective:   Patient ID: [name] female   DOB: 1946/03/01 69 y.o.   MRN: [id number]  HPI: [name] is a 69 y.o. female w/ PMHx of HTN, GERD, COPD, osteoporosis, and depression, presents to the clinic today for a follow-up visit. Patient has not been seen in [facility] since 04/16/2013, but has very few complaints today. States her allergies bother her from time to time and her Zyrtec is not helping her. Needs refills on almost all of her medications. States her breathing has been okay recently, no issues w/ her COPD. Previously had a Bone Density in 2010 which showed T-score of -2.5 in femoral neck. Had been started on Fosamax 70 mg once weekly but states she cannot remember the last time she took it.  Patient's BP is also elevated to 174/74 today, recheck was 322'G systolic. Takes Chlorthalidone 50 mg daily and Lopressor 25 mg bid. After further questioning, it seems that the patient may not be compliant w/ evening dose of Lopressor on most days as she says it makes her go to the bathroom all night. Clarified that she is not confusing this with the Chlorthalidone, but states that it is in fact her evening dose of Lopressor that makes her go to the bathroom. No other issues.   Past Medical History  Diagnosis Date  . Cataract of both eyes 01/18/2009    Right surgically repaired  . Sarcoidosis 02/05/2009    PFTs 03/30/2009: FVC 67%, FEV1 48%, FEV1/FVC 52%, TLC 104%, DLCO 71%.  . Depression 06/08/2007  . Essential hypertension, benign 06/08/2007  . 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  Marland Kitchen GERD 01/18/2009  . 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 Osteoporosis 12/31/2012    Left femur had T score -2.5 on DEXA scan 02/26/2009.    Current Outpatient Prescriptions  Medication Sig Dispense Refill  . albuterol (PROVENTIL) (2.5 MG/3ML) 0.083% nebulizer solution Take 3 mLs (2.5 mg total) by nebulization every 6 (six) hours  as needed. As needed for SOB 75 mL 3  . albuterol (VENTOLIN HFA) 108 (90 BASE) MCG/ACT inhaler Inhale 2 puffs into the lungs every 6 (six) hours as needed. 3 Inhaler 4  . alendronate (FOSAMAX) 70 MG tablet Take 1 tablet (70 mg total) by mouth every 7 (seven) days. Take with a full glass of water on an empty stomach. 4 tablet 11  . Calcium Carbonate-Vitamin D (CALCIUM-VITAMIN D) 500-200 MG-UNIT per tablet Take 1 tablet by mouth 2 (two) times daily with a meal. 60 tablet 11  . Calcium Carbonate-Vitamin D (CALCIUM-VITAMIN D) 500-200 MG-UNIT per tablet Take 1 tablet by mouth 2 (two) times daily with a meal. 60 tablet 0  . calcium-vitamin D (OS-CAL 500 + D) 500-200 MG-UNIT per tablet Take 1 tablet by mouth 2 (two) times daily. 60 tablet 11  . chlorthalidone (HYGROTON) 50 MG tablet Take 1 tablet (50 mg total) by mouth daily. 90 tablet 1  . Fluticasone-Salmeterol (ADVAIR DISKUS) 500-50 MCG/DOSE AEPB Inhale 1 puff into the lungs every 12 (twelve) hours. 180 each 4  . loratadine (CLARITIN) 10 MG tablet Take 1 tablet (10 mg total) by mouth daily. 30 tablet 2  . metoprolol succinate (TOPROL XL) 50 MG 24 hr tablet Take 1 tablet (50 mg total) by mouth daily. Take with or immediately following a meal. 30 tablet 5  . pantoprazole (PROTONIX) 40 MG tablet Take 1 tablet (40 mg total) by  mouth daily. 90 tablet 1  . pravastatin (PRAVACHOL) 40 MG tablet Take 1 tablet (40 mg total) by mouth every evening. 90 tablet 1   No current facility-administered medications for this visit.    Review of Systems: General: Denies fever, chills, diaphoresis, appetite change and fatigue.  Respiratory: Denies SOB, DOE, cough, and wheezing.   Cardiovascular: Denies chest pain and palpitations.  Gastrointestinal: Denies nausea, vomiting, abdominal pain, and diarrhea.  Genitourinary: Denies dysuria, increased frequency, and flank pain. Endocrine: Denies hot or cold intolerance, polyuria, and polydipsia. Musculoskeletal: Denies  myalgias, back pain, joint swelling, arthralgias and gait problem.  Skin: Denies pallor, rash and wounds.  Neurological: Denies dizziness, seizures, syncope, weakness, lightheadedness, numbness and headaches.  Psychiatric/Behavioral: Denies mood changes, and sleep disturbances.   Objective:   Physical Exam: Filed Vitals:   09/12/14 1541  BP: 174/74  Pulse: 81  Temp: 98 F (36.7 C)  TempSrc: Oral  Height: 5' 5.5" (1.664 m)  Weight: 122 lb 14.4 oz (55.747 kg)  SpO2: 99%    General: Thin-appearing AA female, alert, cooperative, NAD. HEENT: PERRL, EOMI. Moist mucus membranes Neck: Full range of motion without pain, supple, no lymphadenopathy or carotid bruits Lungs: Clear to ascultation bilaterally, normal work of respiration. Faint, scattered end-expiratory wheeze.  Heart: RRR, no murmurs, gallops, or rubs Abdomen: Soft, non-tender, non-distended, BS + Extremities: No cyanosis, clubbing, or edema Neurologic: Alert & oriented x3, cranial nerves II-XII intact, strength grossly intact, sensation intact to light touch   Assessment & Plan:   Please see problem based assessment and plan.

## 2014-09-13 NOTE — Assessment & Plan Note (Signed)
Scattered wheezes on exam. Patient denies SOB. States she does not use her Advair daily and has run out some time ago.  -Refill Advair + Albuterol

## 2014-09-13 NOTE — Assessment & Plan Note (Signed)
Most recent Bone Density in 2010 showed T-score of -2.5. Spine normal. Started on Fosamax 70 mg weekly but states she has not taken this in quite some time.  -Repeat Bone Density -Refilled Fosamax and encouraged compliance.

## 2014-09-13 NOTE — Assessment & Plan Note (Signed)
Refilled Pravachol. Recheck lipid panel.

## 2014-09-13 NOTE — Assessment & Plan Note (Signed)
Mammo + Bone Density ordered today.  -Refer for colonoscopy at next visit.

## 2014-09-13 NOTE — Assessment & Plan Note (Signed)
States she has been having allergies, says her Zyrtec is not helping.  -Change to Claritin 10 mg daily.

## 2014-09-13 NOTE — Progress Notes (Signed)
Internal Medicine Clinic Attending  Case discussed with Dr. Jones at the time of the visit.  We reviewed the resident's history and exam and pertinent patient test results.  I agree with the assessment, diagnosis, and plan of care documented in the resident's note.  

## 2014-09-13 NOTE — Assessment & Plan Note (Signed)
BP Readings from Last 3 Encounters:  09/12/14 174/74  04/15/13 140/85  12/31/12 151/86    Lab Results  Component Value Date   NA 137 09/12/2014   K 4.1 09/12/2014   CREATININE 0.96 09/12/2014    Assessment: Blood pressure control: moderately elevated Progress toward BP goal:  deteriorated Comments: BP elevated today. Some question of compliance w/ evening dose of Lopressor. Also states [name] was in a big rush to get to her appointment today as her ride was late picking her up. Has been taking Chlorthalidone 50 mg daily (has been taking "religiously") + Lopressor 12.5 mg bid (questionable compliance).   Plan: Medications:  continue current medications; Chlorthalidone 50 mg daily; Change Lopressor 25 mg bid to Toprol-XL 50 mg daily.  Educational resources provided: brochure (denies) Self management tools provided: home blood pressure logbook Other plans: Had patient return in 3 months. If BP not controlled, can increase Toprol vs add Norvasc. Has allergy to ACEI.

## 2014-09-13 NOTE — Assessment & Plan Note (Signed)
Refill Protonix. 

## 2014-09-27 ENCOUNTER — Ambulatory Visit (HOSPITAL_COMMUNITY): Payer: Medicare HMO

## 2015-01-01 ENCOUNTER — Telehealth: Payer: Self-pay | Admitting: Internal Medicine

## 2015-01-01 NOTE — Telephone Encounter (Signed)
Call to patient to confirm appointment for 5/24 at 1:45 lmtcb

## 2015-01-02 ENCOUNTER — Encounter: Payer: Medicare HMO | Admitting: Internal Medicine

## 2015-01-09 ENCOUNTER — Encounter: Payer: Medicare HMO | Admitting: Internal Medicine

## 2015-01-15 ENCOUNTER — Telehealth: Payer: Self-pay | Admitting: Internal Medicine

## 2015-01-15 NOTE — Telephone Encounter (Signed)
Call to patient to confirm appointment for 01/16/15 at 3:15 lmtcb

## 2015-01-16 ENCOUNTER — Encounter: Payer: Medicare HMO | Admitting: Internal Medicine

## 2015-01-16 ENCOUNTER — Encounter: Payer: Self-pay | Admitting: Internal Medicine

## 2015-02-05 ENCOUNTER — Telehealth: Payer: Self-pay | Admitting: Internal Medicine

## 2015-02-05 NOTE — Telephone Encounter (Signed)
Call to patient to confirm appointment for 02/06/15 at 2:45 lmtcb

## 2015-02-06 ENCOUNTER — Ambulatory Visit (INDEPENDENT_AMBULATORY_CARE_PROVIDER_SITE_OTHER): Payer: Medicare HMO | Admitting: Internal Medicine

## 2015-02-06 VITALS — BP 155/84 | HR 82 | Temp 98.2°F | Ht 65.0 in | Wt 113.9 lb

## 2015-02-06 DIAGNOSIS — J301 Allergic rhinitis due to pollen: Secondary | ICD-10-CM

## 2015-02-06 DIAGNOSIS — M810 Age-related osteoporosis without current pathological fracture: Secondary | ICD-10-CM

## 2015-02-06 DIAGNOSIS — Z7951 Long term (current) use of inhaled steroids: Secondary | ICD-10-CM

## 2015-02-06 DIAGNOSIS — I10 Essential (primary) hypertension: Secondary | ICD-10-CM | POA: Diagnosis not present

## 2015-02-06 DIAGNOSIS — Z0000 Encounter for general adult medical examination without abnormal findings: Secondary | ICD-10-CM

## 2015-02-06 DIAGNOSIS — J449 Chronic obstructive pulmonary disease, unspecified: Secondary | ICD-10-CM

## 2015-02-06 DIAGNOSIS — J439 Emphysema, unspecified: Secondary | ICD-10-CM

## 2015-02-06 DIAGNOSIS — J309 Allergic rhinitis, unspecified: Secondary | ICD-10-CM | POA: Diagnosis not present

## 2015-02-06 MED ORDER — FLUTICASONE-SALMETEROL 500-50 MCG/DOSE IN AEPB: 1.0000 | INHALATION_SPRAY | Freq: Two times a day (BID) | RESPIRATORY_TRACT | Status: DC

## 2015-02-06 MED ORDER — AMLODIPINE BESYLATE 2.5 MG PO TABS: 2.5000 mg | ORAL_TABLET | Freq: Every day | ORAL | Status: DC

## 2015-02-06 MED ORDER — ALBUTEROL SULFATE HFA 108 (90 BASE) MCG/ACT IN AERS: 2.0000 | INHALATION_SPRAY | Freq: Four times a day (QID) | RESPIRATORY_TRACT | Status: DC | PRN

## 2015-02-06 MED ORDER — FLUTICASONE PROPIONATE 50 MCG/ACT NA SUSP: 2.0000 | Freq: Every day | NASAL | Status: DC

## 2015-02-06 MED ORDER — CETIRIZINE HCL 10 MG PO TABS: 10.0000 mg | ORAL_TABLET | Freq: Every day | ORAL | Status: DC

## 2015-02-06 NOTE — Patient Instructions (Signed)
1. Please schedule a follow up for 3 months.   Please go for your Mammogram and Bone Density.  2. Please take all medications as previously prescribed with the following changes:  Start taking Norvasc 2.5 mg daily  Continue inhaled medications as previously prescribed. Use Advair daily. I have refilled these and sent to pharmacy.  I have also sent a prescription for Zyrtec and Flonase for your allergies.   3. If you have worsening of your symptoms or new symptoms arise, please call the clinic ([phone]), or go to the ER immediately if symptoms are severe.  You have done a great job in taking all your medications. Please continue to do this.

## 2015-02-06 NOTE — Progress Notes (Signed)
Subjective:   Patient ID: [name] female   DOB: 1946-01-04 69 y.o.   MRN: [id number]  HPI: [name] is a 69 y.o. female w/ PMHx of HTN, COPD, GERD, osteoporosis, and depression, presents to the clinic today for a follow-up visit for her chronic medical issues. Patient is doing well today, no significant complaints. She has been spending a significant amount of time dealing w/ her husband and his medical problems, and has not had time to have a bone density or mammogram as previously discussed at her last clinic visit. Has been complaint w/ her medications, denies pain, SOB, difficulty ambulating (uses cane at baseline), dysuria, or diarrhea.    Current Outpatient Prescriptions  Medication Sig Dispense Refill  . albuterol (PROVENTIL) (2.5 MG/3ML) 0.083% nebulizer solution Take 3 mLs (2.5 mg total) by nebulization every 6 (six) hours as needed. As needed for SOB 75 mL 3  . albuterol (VENTOLIN HFA) 108 (90 BASE) MCG/ACT inhaler Inhale 2 puffs into the lungs every 6 (six) hours as needed. 3 Inhaler 4  . alendronate (FOSAMAX) 70 MG tablet Take 1 tablet (70 mg total) by mouth every 7 (seven) days. Take with a full glass of water on an empty stomach. 4 tablet 11  . amLODipine (NORVASC) 2.5 MG tablet Take 1 tablet (2.5 mg total) by mouth daily. 30 tablet 2  . Calcium Carbonate-Vitamin D (CALCIUM-VITAMIN D) 500-200 MG-UNIT per tablet Take 1 tablet by mouth 2 (two) times daily with a meal. 60 tablet 11  . Calcium Carbonate-Vitamin D (CALCIUM-VITAMIN D) 500-200 MG-UNIT per tablet Take 1 tablet by mouth 2 (two) times daily with a meal. 60 tablet 0  . calcium-vitamin D (OS-CAL 500 + D) 500-200 MG-UNIT per tablet Take 1 tablet by mouth 2 (two) times daily. 60 tablet 11  . cetirizine (ZYRTEC ALLERGY) 10 MG tablet Take 1 tablet (10 mg total) by mouth daily. 30 tablet 2  . chlorthalidone (HYGROTON) 50 MG tablet Take 1 tablet (50 mg total) by mouth daily. 90 tablet 1  . fluticasone  (FLONASE) 50 MCG/ACT nasal spray Place 2 sprays into both nostrils daily. 16 g 2  . Fluticasone-Salmeterol (ADVAIR DISKUS) 500-50 MCG/DOSE AEPB Inhale 1 puff into the lungs every 12 (twelve) hours. 180 each 4  . metoprolol succinate (TOPROL XL) 50 MG 24 hr tablet Take 1 tablet (50 mg total) by mouth daily. Take with or immediately following a meal. 30 tablet 5  . pantoprazole (PROTONIX) 40 MG tablet Take 1 tablet (40 mg total) by mouth daily. 90 tablet 1  . pravastatin (PRAVACHOL) 40 MG tablet Take 1 tablet (40 mg total) by mouth every evening. 90 tablet 1   No current facility-administered medications for this visit.   Review of Systems  General: Denies fever, diaphoresis, appetite change, and fatigue.  Respiratory: Denies SOB, cough, and wheezing.   Cardiovascular: Denies chest pain and palpitations.  Gastrointestinal: Denies nausea, vomiting, abdominal pain, and diarrhea Musculoskeletal: Denies myalgias, arthralgias, back pain, and gait problem.  Neurological: Denies dizziness, syncope, weakness, lightheadedness, and headaches.  Psychiatric/Behavioral: Denies mood changes, sleep disturbance, and agitation.   Objective:   Physical Exam: Filed Vitals:   02/06/15 1503  BP: 155/84  Pulse: 82  Temp: 98.2 F (36.8 C)  TempSrc: Oral  Height: 5\' 5"  (1.651 m)  Weight: 113 lb 14.4 oz (51.665 kg)  SpO2: 100%    General: AA female, alert, cooperative, NAD. HEENT: PERRL, EOMI. Moist mucus membranes Neck: Full range of motion without pain,  supple, no lymphadenopathy or carotid bruits Lungs: Air entry equal bilaterally, normal work of respiration, no rales or rhonchi. Mild bilateral end-expiratory wheezes. No obvious SOB.  Heart: RRR, no murmurs, gallops, or rubs Abdomen: Soft, non-tender, non-distended, BS + Extremities: No cyanosis, clubbing, or edema Neurologic: Alert & oriented x3, cranial nerves II-XII intact, strength grossly intact, sensation intact to light touch   Assessment  & Plan:   Please see problem based assessment and plan.

## 2015-02-07 NOTE — Assessment & Plan Note (Signed)
Refilled inhalers 

## 2015-02-07 NOTE — Assessment & Plan Note (Signed)
Patient w/ previous h/o osteoporosis, on Fosamax weekly. Has not had bone density since 2010.  -Repeat bone density, appointment [facility].

## 2015-02-07 NOTE — Assessment & Plan Note (Signed)
BP Readings from Last 3 Encounters:  02/06/15 155/84  09/12/14 174/74  04/15/13 140/85    Lab Results  Component Value Date   NA 137 09/12/2014   K 4.1 09/12/2014   CREATININE 0.96 09/12/2014    Assessment: Blood pressure control:  Mildly elevated.  Progress toward BP goal:    Comments: Compliant w/ Toprol-XL 50 mg daily + Chlorthalidone 50 mg daily.   Plan: Medications:  continue current medications at current doses. Add Norvasc 2.5 mg daily, instead of increasing Toprol given pulmonary issues.  Other plans: RTC in 3 months

## 2015-02-07 NOTE — Progress Notes (Signed)
INTERNAL MEDICINE TEACHING ATTENDING ADDENDUM - Ivonne Freeburg, MD: I reviewed and discussed at the time of visit with the resident Dr. Jones, the patient's medical history, physical examination, diagnosis and results of pertinent tests and treatment and I agree with the patient's care as documented.  

## 2015-02-07 NOTE — Assessment & Plan Note (Signed)
Has had some issues w/ allergies lately. States she has had to use her inhaler a bit more frequently outside because of seasonal allergies. Not using Claritin, states this does not work. -Re-prescribed Zyrtec (has taken before, says this is effective) -Flonase daily

## 2015-02-07 NOTE — Assessment & Plan Note (Signed)
Bone density and mammo scheduled.

## 2015-02-19 ENCOUNTER — Ambulatory Visit: Payer: Medicare HMO

## 2015-02-19 ENCOUNTER — Other Ambulatory Visit: Payer: Medicare HMO

## 2015-02-23 ENCOUNTER — Ambulatory Visit
Admission: RE | Admit: 2015-02-23 | Discharge: 2015-02-23 | Disposition: A | Discharge status: Home / Self Care | Payer: Medicare HMO | Source: Ambulatory Visit | Attending: Internal Medicine | Admitting: Internal Medicine

## 2015-02-23 DIAGNOSIS — Z0000 Encounter for general adult medical examination without abnormal findings: Secondary | ICD-10-CM

## 2015-02-23 DIAGNOSIS — M810 Age-related osteoporosis without current pathological fracture: Secondary | ICD-10-CM

## 2015-03-18 ENCOUNTER — Other Ambulatory Visit: Payer: Self-pay | Admitting: Internal Medicine

## 2015-04-01 ENCOUNTER — Other Ambulatory Visit: Payer: Self-pay | Admitting: Internal Medicine

## 2015-04-18 ENCOUNTER — Other Ambulatory Visit: Payer: Self-pay | Admitting: Internal Medicine

## 2015-05-15 ENCOUNTER — Encounter: Payer: Medicare HMO | Admitting: Internal Medicine

## 2015-05-15 ENCOUNTER — Institutional Professional Consult (permissible substitution): Payer: Medicare HMO | Admitting: Pulmonary Disease

## 2015-05-23 ENCOUNTER — Other Ambulatory Visit: Payer: Self-pay | Admitting: Internal Medicine

## 2015-05-29 ENCOUNTER — Encounter: Payer: Medicare HMO | Admitting: Internal Medicine

## 2015-07-11 ENCOUNTER — Other Ambulatory Visit: Payer: Self-pay | Admitting: Internal Medicine

## 2015-07-17 ENCOUNTER — Other Ambulatory Visit: Payer: Self-pay | Admitting: Internal Medicine

## 2015-09-02 ENCOUNTER — Other Ambulatory Visit: Payer: Self-pay | Admitting: Internal Medicine

## 2015-09-03 NOTE — Telephone Encounter (Signed)
Last visit 6/28.  Pt has not been seen since that time.  He was to f/u in 3 months. Several canceled appointments

## 2015-09-07 ENCOUNTER — Encounter: Payer: Self-pay | Admitting: Internal Medicine

## 2015-09-28 ENCOUNTER — Other Ambulatory Visit: Payer: Self-pay | Admitting: Internal Medicine

## 2015-09-28 NOTE — Telephone Encounter (Signed)
Can we schedule pt please

## 2015-10-02 ENCOUNTER — Ambulatory Visit (INDEPENDENT_AMBULATORY_CARE_PROVIDER_SITE_OTHER): Payer: Medicare HMO | Admitting: Internal Medicine

## 2015-10-02 ENCOUNTER — Other Ambulatory Visit: Payer: Self-pay | Admitting: Internal Medicine

## 2015-10-02 ENCOUNTER — Encounter: Payer: Self-pay | Admitting: Internal Medicine

## 2015-10-02 VITALS — BP 144/81 | HR 97 | Temp 98.5°F | Ht 65.0 in | Wt 110.8 lb

## 2015-10-02 DIAGNOSIS — F4321 Adjustment disorder with depressed mood: Secondary | ICD-10-CM

## 2015-10-02 DIAGNOSIS — Z23 Encounter for immunization: Secondary | ICD-10-CM | POA: Diagnosis not present

## 2015-10-02 DIAGNOSIS — F329 Major depressive disorder, single episode, unspecified: Secondary | ICD-10-CM

## 2015-10-02 DIAGNOSIS — Z7951 Long term (current) use of inhaled steroids: Secondary | ICD-10-CM

## 2015-10-02 DIAGNOSIS — K219 Gastro-esophageal reflux disease without esophagitis: Secondary | ICD-10-CM | POA: Diagnosis not present

## 2015-10-02 DIAGNOSIS — I10 Essential (primary) hypertension: Secondary | ICD-10-CM

## 2015-10-02 DIAGNOSIS — J449 Chronic obstructive pulmonary disease, unspecified: Secondary | ICD-10-CM

## 2015-10-02 DIAGNOSIS — F32A Depression, unspecified: Secondary | ICD-10-CM

## 2015-10-02 DIAGNOSIS — Z681 Body mass index (BMI) 19 or less, adult: Secondary | ICD-10-CM

## 2015-10-02 DIAGNOSIS — E876 Hypokalemia: Secondary | ICD-10-CM | POA: Diagnosis not present

## 2015-10-02 DIAGNOSIS — J439 Emphysema, unspecified: Secondary | ICD-10-CM

## 2015-10-02 DIAGNOSIS — R630 Anorexia: Secondary | ICD-10-CM

## 2015-10-02 DIAGNOSIS — Z0000 Encounter for general adult medical examination without abnormal findings: Secondary | ICD-10-CM

## 2015-10-02 MED ORDER — ALBUTEROL SULFATE HFA 108 (90 BASE) MCG/ACT IN AERS: 2.0000 | INHALATION_SPRAY | Freq: Four times a day (QID) | RESPIRATORY_TRACT | Status: DC | PRN

## 2015-10-02 MED ORDER — FLUTICASONE-SALMETEROL 500-50 MCG/DOSE IN AEPB: 1.0000 | INHALATION_SPRAY | Freq: Two times a day (BID) | RESPIRATORY_TRACT | Status: DC

## 2015-10-02 MED ORDER — MIRTAZAPINE 15 MG PO TABS: 15.0000 mg | ORAL_TABLET | Freq: Every day | ORAL | Status: DC

## 2015-10-02 MED ORDER — METOPROLOL SUCCINATE ER 50 MG PO TB24: ORAL_TABLET | ORAL | Status: DC

## 2015-10-02 MED ORDER — ALBUTEROL SULFATE (2.5 MG/3ML) 0.083% IN NEBU: 2.5000 mg | INHALATION_SOLUTION | Freq: Four times a day (QID) | RESPIRATORY_TRACT | Status: DC | PRN

## 2015-10-02 NOTE — Patient Instructions (Addendum)
1. Please make a follow up appointment for 4 weeks.   2. Please take all medications as previously prescribed with the following changes:  Start taking Mirtazapine 15 mg once at night.   Restart your Metoprolol and Advair. Refills of these medications were sent to your pharmacy.   3. If you have worsening of your symptoms or new symptoms arise, please call the clinic [phone]), or go to the ER immediately if symptoms are severe.  You have done a great job in taking all your medications. Please continue to do this.    Complicated Grieving Grief is a normal response to the death of someone close to you. Feelings of fear, anger, and guilt can affect almost everyone who loses a loved one. It is also common to have symptoms of depression while you are grieving. These include problems with sleep, loss of appetite, and lack of energy. They may last for weeks or months after a loss. Complicated grief is different from normal grief or depression. Normal grieving involves sadness and feelings of loss, but these feelings are not constant. Complicated grief is a constant and severe type of grief. It interferes with your ability to function normally. It may last for several months to a year or longer. Complicated grief may require treatment from a mental health care provider. CAUSES  It is not known why some people continue to struggle with grief and others do not. You may be at higher risk for complicated grief if:  The death of your loved one was sudden or unexpected.  The death of your loved one was due to a violent event.  Your loved one committed suicide.  Your loved one was a child or a young person.  You were very close to or dependent on the loved one.  You have a history of depression. SIGNS AND SYMPTOMS Signs and symptoms of complicated grief may include:  Feeling disbelief or numbness.  Being unable to enjoy good memories of your loved one.  Needing to avoid anything that reminds  you of your loved one.  Being unable to stop thinking about the death.  Feeling intense anger or guilt.  Feeling alone and hopeless.  Feeling that your life is meaningless and empty.  Losing the desire to live. DIAGNOSIS Your health care provider may diagnose complicated grief if:  You have constant symptoms of grief for 6-12 months or longer.  Your symptoms are interfering with your ability to live your life. Your health care provider may want you to see a mental health care provider. Many symptoms of depression are similar to the symptoms of complicated grief. It is important to be evaluated for complicated grief along with other mental health conditions. TREATMENT  Talk therapy with a mental health provider is the most common treatment for complicated grief. During therapy, you will learn healthy ways to cope with the loss of your loved one. In some cases, your mental health care provider may also recommend antidepressant medicines. HOME CARE INSTRUCTIONS  Take care of yourself.  Eat regular meals and maintain a healthy diet. Eat plenty of fruits, vegetables, and whole grains.  Try to get some exercise each day.  Keep regular hours for sleep. Try to get at least 8 hours of sleep each night.  Do not use drugs or alcohol to ease your symptoms.  Take medicines only as directed by your health care provider.  Spend time with friends and loved ones.  Consider joining a grief (bereavement) support group to help  you deal with your loss.  Keep all follow-up visits as directed by your health care provider. This is important. SEEK MEDICAL CARE IF:  Your symptoms keep you from functioning normally.  Your symptoms do not get better with treatment. SEEK IMMEDIATE MEDICAL CARE IF:  You have serious thoughts of hurting yourself or someone else.  You have suicidal feelings.   This information is not intended to replace advice given to you by your health care provider. Make sure  you discuss any questions you have with your health care provider.   Document Released: 07/28/2005 Document Revised: 04/18/2015 Document Reviewed: 01/05/2014 Elsevier Interactive Patient Education [employer].

## 2015-10-02 NOTE — Progress Notes (Signed)
Subjective:   Patient ID: [name] female   DOB: [birth year] 70 y.o.   MRN: [id number]  HPI: [name] is a 70 y.o. female w/ PMHx of HTN, COPD, GERD, osteoporosis, and depression, presents to the clinic today for a follow-up visit for her HTN. Patient is still grieving the loss of her husband who passed in 05/2015. She has been dealing with severe depression symptoms; anhedonia, sadness, decreased appetite, difficulty sleeping, concentrating, significant feelings of guilt surrounding her husband's death. No suicidal ideations. She feels she has lost a lot of weight because she has not been eating very well.   She also has run out of her inhaler medications, has not used her Advair in some time. BP is mildly elevated, has been out of her Toprol-XL.   Otherwise, she has no further complaints. Denies pain.   Current Outpatient Prescriptions  Medication Sig Dispense Refill  . albuterol (PROVENTIL) (2.5 MG/3ML) 0.083% nebulizer solution Take 3 mLs (2.5 mg total) by nebulization every 6 (six) hours as needed. As needed for SOB 75 mL 3  . albuterol (VENTOLIN HFA) 108 (90 BASE) MCG/ACT inhaler Inhale 2 puffs into the lungs every 6 (six) hours as needed. 3 Inhaler 4  . alendronate (FOSAMAX) 70 MG tablet TAKE 1 TABLET BY MOUTH EVERY 7 DAYS. TAKE WITH FULL GLASS OF WATER ON AN EMPTY STOMACH 4 tablet 5  . amLODipine (NORVASC) 2.5 MG tablet TAKE 1 TABLET(2.5 MG) BY MOUTH DAILY 30 tablet 5  . Calcium Carbonate-Vitamin D (CALCIUM-VITAMIN D) 500-200 MG-UNIT per tablet Take 1 tablet by mouth 2 (two) times daily with a meal. 60 tablet 11  . Calcium Carbonate-Vitamin D (CALCIUM-VITAMIN D) 500-200 MG-UNIT per tablet Take 1 tablet by mouth 2 (two) times daily with a meal. 60 tablet 0  . calcium-vitamin D (OS-CAL 500 + D) 500-200 MG-UNIT per tablet Take 1 tablet by mouth 2 (two) times daily. 60 tablet 11  . cetirizine (ZYRTEC ALLERGY) 10 MG tablet Take 1 tablet (10 mg total) by mouth daily. 30  tablet 2  . chlorthalidone (HYGROTON) 50 MG tablet TAKE 1 TABLET BY MOUTH EVERY DAY 90 tablet 1  . fluticasone (FLONASE) 50 MCG/ACT nasal spray Place 2 sprays into both nostrils daily. 16 g 2  . Fluticasone-Salmeterol (ADVAIR DISKUS) 500-50 MCG/DOSE AEPB Inhale 1 puff into the lungs every 12 (twelve) hours. 180 each 4  . metoprolol succinate (TOPROL-XL) 50 MG 24 hr tablet TAKE 1 TABLET BY MOUTH DAILY. TAKE WITH OR IMMEDIATELY FOLLOWING A MEAL 30 tablet 5  . pantoprazole (PROTONIX) 40 MG tablet TAKE 1 TABLET BY MOUTH EVERY DAY 90 tablet 0  . pravastatin (PRAVACHOL) 40 MG tablet TAKE 1 TABLET EVERY EVENING 90 tablet 1   No current facility-administered medications for this visit.   Review of Systems  General: Positive for fatigue and decreased appetite. Denies fever, diaphoresis.  Respiratory: Denies SOB, cough, and wheezing.   Cardiovascular: Denies chest pain and palpitations.  Gastrointestinal: Denies nausea, vomiting, abdominal pain, and diarrhea Musculoskeletal: Denies myalgias, arthralgias, back pain, and gait problem.  Neurological: Denies dizziness, syncope, weakness, lightheadedness, and headaches.  Psychiatric/Behavioral: Positive for depressed mood and difficulty sleeping. Denies agitation.   Objective:   Physical Exam: Filed Vitals:   10/02/15 1519  BP: 144/81  Pulse: 97  Temp: 98.5 F (36.9 C)  TempSrc: Oral  Height: 5\' 5"  (1.651 m)  Weight: 110 lb 12.8 oz (50.259 kg)  SpO2: 100%    General: AA female, alert, cooperative, NAD.  HEENT: PERRL, EOMI. Moist mucus membranes Neck: Full range of motion without pain, supple, no lymphadenopathy or carotid bruits Lungs: Air entry equal bilaterally, normal work of respiration, no rales or rhonchi. Faint scattered end-expiratory wheezes. No obvious SOB.  Heart: RRR, no murmurs, gallops, or rubs Abdomen: Soft, non-tender, non-distended, BS + Extremities: No cyanosis, clubbing, or edema Neurologic: Alert & oriented x3, cranial  nerves II-XII intact, strength grossly intact, sensation intact to light touch   Assessment & Plan:   Please see problem based assessment and plan.

## 2015-10-03 ENCOUNTER — Other Ambulatory Visit: Payer: Self-pay | Admitting: Internal Medicine

## 2015-10-03 DIAGNOSIS — F4321 Adjustment disorder with depressed mood: Secondary | ICD-10-CM | POA: Insufficient documentation

## 2015-10-03 DIAGNOSIS — E876 Hypokalemia: Secondary | ICD-10-CM | POA: Insufficient documentation

## 2015-10-03 LAB — BMP8+ANION GAP
ANION GAP: 19 mmol/L — AB (ref 10.0–18.0)
BUN/Creatinine Ratio: 11 (ref 11–26)
BUN: 9 mg/dL (ref 8–27)
CO2: 29 mmol/L (ref 18–29)
CREATININE: 0.8 mg/dL (ref 0.57–1.00)
Calcium: 8.9 mg/dL (ref 8.7–10.3)
Chloride: 88 mmol/L — ABNORMAL LOW (ref 96–106)
GFR calc Af Amer: 87 mL/min/{1.73_m2} (ref >59)
GFR, EST NON AFRICAN AMERICAN: 75 mL/min/{1.73_m2} (ref >59)
Glucose: 96 mg/dL (ref 65–99)
Potassium: 3 mmol/L — ABNORMAL LOW (ref 3.5–5.2)
Sodium: 136 mmol/L (ref 134–144)

## 2015-10-03 LAB — HEPATITIS C ANTIBODY

## 2015-10-03 MED ORDER — POTASSIUM CHLORIDE ER 20 MEQ PO TBCR: 20.0000 meq | EXTENDED_RELEASE_TABLET | Freq: Every day | ORAL | Status: DC

## 2015-10-03 NOTE — Assessment & Plan Note (Signed)
Given flu shot  today 

## 2015-10-03 NOTE — Assessment & Plan Note (Signed)
K 3.0 on BMP today.  -Will start K-dur 20 mEq daily. Good renal function -RTC in 4 weeks, check BMP at that time.

## 2015-10-03 NOTE — Assessment & Plan Note (Signed)
BP Readings from Last 3 Encounters:  10/02/15 144/81  02/06/15 155/84  09/12/14 174/74    Lab Results  Component Value Date   NA 136 10/02/2015   K 3.0* 10/02/2015   CREATININE 0.80 10/02/2015    Assessment: Blood pressure control:  Mild elevation Comments: Has been without her Toprol-XL.   Plan: Medications:  continue current medications; Chlorthalidone 50 mg daily, Norvasc 2.5 mg daily. Refill Toprol-XL 50 mg daily.  Other plans: BMP with mild hypokalemia. Will supplement.

## 2015-10-03 NOTE — Assessment & Plan Note (Signed)
Refill Protonix. 

## 2015-10-03 NOTE — Assessment & Plan Note (Addendum)
Symptoms of mild depression prior to loss of her husband, now symptoms of depression are significantly worse. Does not technically qualify as "complicated grief" at this time based on time frame of grieving process. Does have issues with daily sadness, anhedonia, sleep disturbance, anxiety, guilt (mostly surrounding husband's death), difficulty concentrating, and decreased energy. No suicidal ideations. Feel that this is related to grieving process, although think she would benefit from treatment based on pre-exisiting mild/moderate depression.  -Start Mirtazepine 15 mg qhs. Feel this will also help with appetite and sleep  -RTC in 4 weeks to determine if she finds this medication beneficial -Declined psychotherapy

## 2015-10-03 NOTE — Assessment & Plan Note (Signed)
Patient states she is not eating very much, feels she has lost quite a bit of weight. Feel this is likely related to her grieving since the lost of her husband. No fevers, night sweats. No lymphadenopathy.  -Starting Mirtazepine 15 mg qhs for depression symptoms (had prior to grieving process), feel this will also help with her appetite. -RTC in 4 weeks

## 2015-10-03 NOTE — Assessment & Plan Note (Addendum)
Patient grieving the loss of her husband in 05/2015. Seems to be doing okay, although does have significant depression symptoms.  -RTC in 4 weeks

## 2015-10-03 NOTE — Assessment & Plan Note (Signed)
Breathing is okay, has had to use her inhaler a few times recently. Scattered faint wheezes on exam.  -Refilled inhalers; Advair, albuterol

## 2015-10-04 NOTE — Progress Notes (Signed)
Internal Medicine Clinic Attending  Case discussed with Dr. Jones at the time of the visit.  We reviewed the resident's history and exam and pertinent patient test results.  I agree with the assessment, diagnosis, and plan of care documented in the resident's note.  

## 2015-10-30 ENCOUNTER — Ambulatory Visit: Payer: Medicare HMO | Admitting: Internal Medicine

## 2015-11-06 ENCOUNTER — Ambulatory Visit: Payer: Medicare HMO | Admitting: Internal Medicine

## 2015-11-06 ENCOUNTER — Encounter: Payer: Self-pay | Admitting: Internal Medicine

## 2015-11-29 ENCOUNTER — Other Ambulatory Visit: Payer: Self-pay | Admitting: Internal Medicine

## 2015-12-26 ENCOUNTER — Encounter: Payer: Self-pay | Admitting: Internal Medicine

## 2015-12-26 ENCOUNTER — Other Ambulatory Visit: Payer: Self-pay | Admitting: Internal Medicine

## 2015-12-26 ENCOUNTER — Ambulatory Visit (INDEPENDENT_AMBULATORY_CARE_PROVIDER_SITE_OTHER): Payer: Medicare HMO | Admitting: Internal Medicine

## 2015-12-26 VITALS — BP 122/81 | HR 104 | Temp 98.0°F | Ht 65.0 in | Wt 110.4 lb

## 2015-12-26 DIAGNOSIS — J439 Emphysema, unspecified: Secondary | ICD-10-CM

## 2015-12-26 DIAGNOSIS — I10 Essential (primary) hypertension: Secondary | ICD-10-CM

## 2015-12-26 DIAGNOSIS — R630 Anorexia: Secondary | ICD-10-CM

## 2015-12-26 DIAGNOSIS — J449 Chronic obstructive pulmonary disease, unspecified: Secondary | ICD-10-CM

## 2015-12-26 DIAGNOSIS — F329 Major depressive disorder, single episode, unspecified: Secondary | ICD-10-CM

## 2015-12-26 DIAGNOSIS — J301 Allergic rhinitis due to pollen: Secondary | ICD-10-CM

## 2015-12-26 DIAGNOSIS — N3946 Mixed incontinence: Secondary | ICD-10-CM | POA: Diagnosis not present

## 2015-12-26 DIAGNOSIS — F32A Depression, unspecified: Secondary | ICD-10-CM

## 2015-12-26 DIAGNOSIS — W19XXXA Unspecified fall, initial encounter: Secondary | ICD-10-CM

## 2015-12-26 DIAGNOSIS — Z681 Body mass index (BMI) 19 or less, adult: Secondary | ICD-10-CM

## 2015-12-26 DIAGNOSIS — F4321 Adjustment disorder with depressed mood: Secondary | ICD-10-CM

## 2015-12-26 DIAGNOSIS — E876 Hypokalemia: Secondary | ICD-10-CM

## 2015-12-26 DIAGNOSIS — R296 Repeated falls: Secondary | ICD-10-CM

## 2015-12-26 MED ORDER — MIRTAZAPINE 30 MG PO TABS: 30.0000 mg | ORAL_TABLET | Freq: Every day | ORAL | Status: DC

## 2015-12-26 MED ORDER — CETIRIZINE HCL 10 MG PO TABS: 10.0000 mg | ORAL_TABLET | Freq: Every day | ORAL | Status: DC

## 2015-12-26 MED ORDER — FLUTICASONE PROPIONATE 50 MCG/ACT NA SUSP: 2.0000 | Freq: Every day | NASAL | Status: DC

## 2015-12-26 MED ORDER — FLUTICASONE-SALMETEROL 500-50 MCG/DOSE IN AEPB: 1.0000 | INHALATION_SPRAY | Freq: Two times a day (BID) | RESPIRATORY_TRACT | Status: DC

## 2015-12-26 NOTE — Patient Instructions (Signed)
1. Please schedule an appointment for 6 weeks.   2. Please take all medications as previously prescribed with the following changes:  Increase Remeron to 30 mg at night. I have sent a new prescription to your pharmacy.   Use Flonase daily for allergies as well as Zyrtec.   I have placed a referral for podiatry for your foot pain, as well as home health PT for your recent falls.   Continue to take your other medications as directed.   If there are any abnormalities with your labs, I will call you.   3. If you have worsening of your symptoms or new symptoms arise, please call the clinic [phone] or go to the ER immediately if symptoms are severe.  Urinary Incontinence Urinary incontinence is the involuntary loss of urine from your bladder. CAUSES  There are many causes of urinary incontinence. They include:  Medicines.  Infections.  Prostatic enlargement, leading to overflow of urine from your bladder.  Surgery.  Neurological diseases.  Emotional factors. SIGNS AND SYMPTOMS Urinary Incontinence can be divided into four types: 1. Urge incontinence. Urge incontinence is the involuntary loss of urine before you have the opportunity to go to the bathroom. There is a sudden urge to void but not enough time to reach a bathroom. 2. Stress incontinence. Stress incontinence is the sudden loss of urine with any activity that forces urine to pass. It is commonly caused by anatomical changes to the pelvis and sphincter areas of your body. 3. Overflow incontinence. Overflow incontinence is the loss of urine from an obstructed opening to your bladder. This results in a backup of urine and a resultant buildup of pressure within the bladder. When the pressure within the bladder exceeds the closing pressure of the sphincter, the urine overflows, which causes incontinence, similar to water overflowing a dam. 4. Total incontinence. Total incontinence is the loss of urine as a result of the  inability to store urine within your bladder. DIAGNOSIS  Evaluating the cause of incontinence may require:  A thorough and complete medical and obstetric history.  A complete physical exam.  Laboratory tests such as a urine culture and sensitivities. When additional tests are indicated, they can include:  An ultrasound exam.  Kidney and bladder X-rays.  Cystoscopy. This is an exam of the bladder using a narrow scope.  Urodynamic testing to test the nerve function to the bladder and sphincter areas. TREATMENT  Treatment for urinary incontinence depends on the cause:  For urge incontinence caused by a bacterial infection, antibiotics will be prescribed. If the urge incontinence is related to medicines you take, your health care provider may have you change the medicine.  For stress incontinence, surgery to re-establish anatomical support to the bladder or sphincter, or both, will often correct the condition.  For overflow incontinence caused by an enlarged prostate, an operation to open the channel through the enlarged prostate will allow the flow of urine out of the bladder. In women with fibroids, a hysterectomy may be recommended.  For total incontinence, surgery on your urinary sphincter may help. An artificial urinary sphincter (an inflatable cuff placed around the urethra) may be required. In women who have developed a hole-like passage between their bladder and vagina (vesicovaginal fistula), surgery to close the fistula often is required. HOME CARE INSTRUCTIONS  Normal daily hygiene and the use of pads or adult diapers that are changed regularly will help prevent odors and skin damage.  Avoid caffeine. It can overstimulate your bladder.  Use  the bathroom regularly. Try about every 2-3 hours to go to the bathroom, even if you do not feel the need to do so. Take time to empty your bladder completely. After urinating, wait a minute. Then try to urinate again.  For causes  involving nerve dysfunction, keep a log of the medicines you take and a journal of the times you go to the bathroom. SEEK MEDICAL CARE IF:  You experience worsening of pain instead of improvement in pain after your procedure.  Your incontinence becomes worse instead of better. SEE IMMEDIATE MEDICAL CARE IF:  You experience fever or shaking chills.  You are unable to pass your urine.  You have redness spreading into your groin or down into your thighs. MAKE SURE YOU:   Understand these instructions.   Will watch your condition.  Will get help right away if you are not doing well or get worse.   This information is not intended to replace advice given to you by your health care provider. Make sure you discuss any questions you have with your health care provider.   Document Released: 09/04/2004 Document Revised: 08/18/2014 Document Reviewed: 01/04/2013 Elsevier Interactive Patient Education [employer].

## 2015-12-26 NOTE — Progress Notes (Signed)
Subjective:   Patient ID: [name] female   DOB: 17-Dec-1945 70 y.o.   MRN: [id number]  HPI: [name] is a 70 y.o. female w/ PMHx of HTN, COPD, GERD, osteoporosis, and depression, presents to the clinic today for a follow-up visit for her HTN and weight loss. Patient is accompanied by her two daughters today. Her main complaint is that of foot pain, describes it as bilateral, cramping, painful over the great toes, typically worse at night. Patient has very large bunions on both feet which have always caused her trouble. She also says her appetite is still relatively poor and her mood is only mildly improved since her last visit. Her weight is stable today. No suicidal ideations. Patient also says she had a fall at home, claims her foot pain caused her to trip and she fell, did not sustain any injuries. No dizziness, loss of consciousness, or palpitations. She says she feels somewhat weak with intermittent fatigue and thinks this may also be contributing.   Current Outpatient Prescriptions  Medication Sig Dispense Refill  . albuterol (PROVENTIL) (2.5 MG/3ML) 0.083% nebulizer solution Take 3 mLs (2.5 mg total) by nebulization every 6 (six) hours as needed. As needed for SOB 75 mL 3  . albuterol (VENTOLIN HFA) 108 (90 Base) MCG/ACT inhaler Inhale 2 puffs into the lungs every 6 (six) hours as needed. 3 Inhaler 4  . alendronate (FOSAMAX) 70 MG tablet TAKE 1 TABLET BY MOUTH EVERY 7 DAYS. TAKE WITH FULL GLASS OF WATER ON AN EMPTY STOMACH 4 tablet 5  . amLODipine (NORVASC) 2.5 MG tablet TAKE 1 TABLET(2.5 MG) BY MOUTH DAILY 30 tablet 5  . calcium-vitamin D (OS-CAL 500 + D) 500-200 MG-UNIT per tablet Take 1 tablet by mouth 2 (two) times daily. 60 tablet 11  . cetirizine (ZYRTEC ALLERGY) 10 MG tablet Take 1 tablet (10 mg total) by mouth daily. 30 tablet 2  . chlorthalidone (HYGROTON) 50 MG tablet TAKE 1 TABLET BY MOUTH EVERY DAY 90 tablet 1  . fluticasone (FLONASE) 50 MCG/ACT nasal spray  Place 2 sprays into both nostrils daily. 16 g 2  . Fluticasone-Salmeterol (ADVAIR DISKUS) 500-50 MCG/DOSE AEPB Inhale 1 puff into the lungs every 12 (twelve) hours. 180 each 4  . metoprolol succinate (TOPROL-XL) 50 MG 24 hr tablet TAKE 1 TABLET BY MOUTH DAILY. TAKE WITH OR IMMEDIATELY FOLLOWING A MEAL 90 tablet 1  . mirtazapine (REMERON) 30 MG tablet Take 1 tablet (30 mg total) by mouth at bedtime. 30 tablet 2  . pantoprazole (PROTONIX) 40 MG tablet TAKE 1 TABLET BY MOUTH EVERY DAY 90 tablet 0  . potassium chloride 20 MEQ TBCR Take 20 mEq by mouth daily. 30 tablet 1  . pravastatin (PRAVACHOL) 40 MG tablet TAKE 1 TABLET EVERY EVENING 90 tablet 1   No current facility-administered medications for this visit.   Review of Systems  General: Positive for intermittent fatigue and decreased appetite. Denies fever, diaphoresis.  Respiratory: Denies SOB, cough, and wheezing.   Cardiovascular: Denies chest pain and palpitations.  Gastrointestinal: Denies nausea, vomiting, abdominal pain, and diarrhea Musculoskeletal: Positive for bilateral foot pain. Denies myalgias, back pain, and gait problem.  Neurological: Positive for mild generalized weakness. Denies dizziness, syncope,  lightheadedness, and headaches.  Psychiatric/Behavioral: Positive for depressed mood. Denies sleep disturbance, and agitation.   Objective:   Physical Exam: Filed Vitals:   12/26/15 1109  BP: 122/81  Pulse: 104  Temp: 98 F (36.7 C)  TempSrc: Oral  Height: 5\' 5"  (  1.651 m)  Weight: 110 lb 6.4 oz (50.077 kg)  SpO2: 100%    General: AA female, alert, cooperative, NAD. HEENT: PERRL, EOMI. Moist mucus membranes Neck: Full range of motion without pain, supple, no lymphadenopathy or carotid bruits Lungs: Air entry equal bilaterally, normal work of respiration, no rales or rhonchi. Faint scattered end-expiratory wheezes. No obvious SOB.  Heart: RRR, no murmurs, gallops, or rubs Abdomen: Soft, non-tender, non-distended, BS  + Extremities: No cyanosis, clubbing, or edema. Bilateral bunions.  Neurologic: Alert & oriented x3, cranial nerves II-XII intact, strength grossly intact, sensation intact to light touch   Assessment & Plan:   Please see problem based assessment and plan.

## 2015-12-27 LAB — BMP8+ANION GAP
ANION GAP: 22 mmol/L — AB (ref 10.0–18.0)
BUN/Creatinine Ratio: 13 (ref 12–28)
BUN: 15 mg/dL (ref 8–27)
CO2: 23 mmol/L (ref 18–29)
CREATININE: 1.2 mg/dL — AB (ref 0.57–1.00)
Calcium: 9.2 mg/dL (ref 8.7–10.3)
Chloride: 88 mmol/L — ABNORMAL LOW (ref 96–106)
GFR calc Af Amer: 53 mL/min/{1.73_m2} — ABNORMAL LOW (ref >59)
GFR calc non Af Amer: 46 mL/min/{1.73_m2} — ABNORMAL LOW (ref >59)
Glucose: 123 mg/dL — ABNORMAL HIGH (ref 65–99)
Potassium: 4.3 mmol/L (ref 3.5–5.2)
Sodium: 133 mmol/L — ABNORMAL LOW (ref 134–144)

## 2015-12-27 LAB — MAGNESIUM: MAGNESIUM: 1.7 mg/dL (ref 1.6–2.3)

## 2015-12-27 NOTE — Assessment & Plan Note (Signed)
Recently started on Remeron for depression and decreased appetite. Feels her appetite is about the same, however, her weight is stable today.  -Increase Remeron to 30 mg qhs

## 2015-12-27 NOTE — Assessment & Plan Note (Signed)
Still with symptoms of depression, decreased energy, poor appetite, sleep disturbance, anxiety. She does say her symptoms may be slightly better. Feels her appetite is at least a tiny bit improved. No suicidal ideations. Still feel that a large component of this is her recent passing of her husband, however, I feel there is a large component of untreated depression from before.  -Increase Remeron to 30 mg qhs.  -Follow up in 4-6 weeks.

## 2015-12-27 NOTE — Assessment & Plan Note (Signed)
Think she is coping well with the passing of her husband, however, she does describe symptoms of depression, most likely present before the loss of her husband. Has good family support.  -Please see depression section.  -Increased Remeron

## 2015-12-27 NOTE — Assessment & Plan Note (Signed)
Repeat BMP shows normal K, although Cr is up from baseline, most likely just due to mild dehydration due to decreased po intake.  -Continue K-dur 20 mEq daily -Recheck BMP at next clinic visit

## 2015-12-27 NOTE — Assessment & Plan Note (Signed)
Patient with recent fall at home, seems to be mechanical in nature, related to foot pain, instability, and mild generalized weakness. Patient is thin, frail appearing, and likely deconditioned. Does not describe recent dizziness, palpitations, LOC. Do suspect this is mechanical.  -Will arrange for home health PT for strengthening, balance, and endurance.

## 2015-12-27 NOTE — Assessment & Plan Note (Signed)
BP Readings from Last 3 Encounters:  12/26/15 122/81  10/02/15 144/81  02/06/15 155/84    Lab Results  Component Value Date   NA 133* 12/26/2015   K 4.3 12/26/2015   CREATININE 1.20* 12/26/2015    Assessment: Blood pressure control:   Well controlled Progress toward BP goal:   At goal Comments: Taking Toprol-XL 50 mg daily, Norvasc 2.5 mg daily, and Chlorthalidone 50 mg daily.   Plan: Medications:  continue current medications Other plans: Checked renal function, Cr slightly elevated, likely pre-renal. Needs recheck at next appointment.

## 2015-12-27 NOTE — Progress Notes (Signed)
Medicine attending: Medical history, presenting problems, physical findings, and medications, reviewed with resident physician Dr Eden Jones on the day of the patient visit and I concur with his evaluation and management plan. 

## 2015-12-27 NOTE — Assessment & Plan Note (Signed)
Breathing at baseline.  -Refilled Advair, allergy medications

## 2015-12-27 NOTE — Assessment & Plan Note (Signed)
Patient does describe symptoms of both stress and urge incontinence. Says she frequently loses some urine with sneezing and laughing, and also states she has to rush to the toilet to go to the bathroom and has had an accident before because of this.  -Discussed Kegel exercises, scheduled urination (especially prior to bedtime), and pads -Even though she does have component of urge incontinence, do not think she would be good candidate for antimuscarinic agents due to age and recent falls, however, may be good candidate for Myrbetriq in the future. Overall, would try to avoid medical therapy altogether.

## 2016-01-03 ENCOUNTER — Other Ambulatory Visit: Payer: Self-pay | Admitting: Internal Medicine

## 2016-01-10 ENCOUNTER — Ambulatory Visit: Payer: Medicare HMO | Admitting: Podiatry

## 2016-01-21 ENCOUNTER — Encounter: Payer: Self-pay | Admitting: *Deleted

## 2016-01-29 ENCOUNTER — Encounter: Payer: Self-pay | Admitting: Internal Medicine

## 2016-01-29 ENCOUNTER — Encounter: Payer: Medicare HMO | Admitting: Internal Medicine

## 2016-01-30 ENCOUNTER — Ambulatory Visit: Payer: Medicare HMO | Admitting: Podiatry

## 2016-01-30 NOTE — Addendum Note (Signed)
Addended by: Hulan Fray on: 01/30/2016 06:45 PM   Modules accepted: Orders, SmartSet

## 2016-03-10 ENCOUNTER — Other Ambulatory Visit: Payer: Self-pay | Admitting: Oncology

## 2016-03-20 ENCOUNTER — Ambulatory Visit: Payer: Medicare HMO

## 2016-03-21 ENCOUNTER — Encounter: Payer: Self-pay | Admitting: Internal Medicine

## 2016-03-21 ENCOUNTER — Ambulatory Visit (INDEPENDENT_AMBULATORY_CARE_PROVIDER_SITE_OTHER): Payer: Medicare HMO | Admitting: Internal Medicine

## 2016-03-21 DIAGNOSIS — Z8669 Personal history of other diseases of the nervous system and sense organs: Secondary | ICD-10-CM

## 2016-03-21 DIAGNOSIS — Z87898 Personal history of other specified conditions: Secondary | ICD-10-CM

## 2016-03-21 MED ORDER — SALINE SPRAY 0.65 % NA SOLN: 1.0000 | NASAL | 3 refills | Status: DC | PRN

## 2016-03-21 MED ORDER — OXYMETAZOLINE HCL 0.05 % NA SOLN: 1.0000 | Freq: Two times a day (BID) | NASAL | 0 refills | Status: DC

## 2016-03-21 NOTE — Assessment & Plan Note (Signed)
Patient is here for nosebleeds. She says she used to have nosebleeds all the time when she was young and it stopped. Sometimes she would have a drop of blood or two. Sunday night, she had some bleeding and she saw the cloth was covered with blood. The bleeding stopped. She is not on aspirin or other blood thinners. She says she occasionally blows her nose, but does not pick her nose. She also feels that the nose is dry. She takes advil sometimes. She frequently uses Flonase and zyrtec.  On exam, right nare had a well healed scab on the anterior septum.  Plan -Advised to stop using flonase and zyrtec, and to not blow or pick her nose -prescribed normal saline nasal spray -prescribed Afrin spray for any bleeding.  -Advised to have humidifier to keep air moist -Advised that if bleeding persists for > 10 minutes then to call EMS and go to ER. She should also pinch her nose and bend forward.

## 2016-03-21 NOTE — Progress Notes (Signed)
   CC: nosebleeds HPI: [name] is a 70 y.o. woman with PMH noted below here for nosebleeds  Please see Problem List/A&P for the status of the patient's chronic medical problems   Past Medical History:  Diagnosis Date  . Cataract of both eyes 01/18/2009   Right surgically repaired  . 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  . Depression 06/08/2007  . Essential hypertension, benign 06/08/2007  . GERD 01/18/2009  . Osteoporosis 12/31/2012   Left femur had T score -2.5 on DEXA scan 02/26/2009.   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 Sarcoidosis ([place]) 02/05/2009   PFTs 03/30/2009: FVC 67%, FEV1 48%, FEV1/FVC 52%, TLC 104%, DLCO 71%.    Review of Systems: Denies fevers, feels well Has dry nose. Does not pick the nose, but blows her nose. Had nosebleed . Denies cough, allergies Denies n/v  Physical Exam: Vitals:   03/21/16 1525  BP: (!) 147/66  Pulse: 86  Temp: 98.1 F (36.7 C)  TempSrc: Oral  SpO2: 96%  Weight: 116 lb 6.4 oz (52.8 kg)    General: A&O, in NAD HEENT: Right nare had a well healed scab on the septum in the anterior part. Left nare normal. No active signs of bleeding CV: RRR, normal s1, s2, no m/r/g, Resp: equal and symmetric breath sounds, no wheezing or crackles  Abdomen: soft, nontender, nondistended, +BS   Assessment & Plan:   See encounters tab for problem based medical decision making. Patient discussed with Dr. Daryll Drown

## 2016-03-21 NOTE — Patient Instructions (Signed)
Thank you for your visit today  Please stop using the flonase and the zyrtec. Avoid ibuprofen, advil, aspirin etc.  Please use normal saline spray (ocean) which I prescribed or you can buy from store  Please use humidifier in the room  Please do not blow or pick your nose  Please use the Afrin spray if your nosebleed does not stop in 5 minutes. I have prescribed you that.   Please call the EMS if your nosebleed continues or if lot of bleeding occurs and go to the ER

## 2016-03-31 NOTE — Progress Notes (Signed)
Internal Medicine Clinic Attending  Case discussed with Dr. Saraiya at the time of the visit.  We reviewed the resident's history and exam and pertinent patient test results.  I agree with the assessment, diagnosis, and plan of care documented in the resident's note.  

## 2016-04-05 ENCOUNTER — Other Ambulatory Visit: Payer: Self-pay | Admitting: Internal Medicine

## 2016-04-24 ENCOUNTER — Other Ambulatory Visit: Payer: Self-pay | Admitting: Internal Medicine

## 2016-04-24 DIAGNOSIS — I10 Essential (primary) hypertension: Secondary | ICD-10-CM

## 2016-05-21 ENCOUNTER — Other Ambulatory Visit: Payer: Self-pay | Admitting: *Deleted

## 2016-05-21 MED ORDER — ALENDRONATE SODIUM 70 MG PO TABS: ORAL_TABLET | ORAL | 5 refills | Status: DC

## 2016-05-21 MED ORDER — POTASSIUM CHLORIDE ER 20 MEQ PO TBCR: 1.0000 | EXTENDED_RELEASE_TABLET | Freq: Every day | ORAL | 5 refills | Status: DC

## 2016-05-21 NOTE — Assessment & Plan Note (Deleted)
BP Readings from Last 3 Encounters:  03/21/16 (!) 147/66  12/26/15 122/81  10/02/15 (!) 144/81   Lab Results  Component Value Date   CREATININE 1.20 (H) 12/26/2015   Lab Results  Component Value Date   K 4.3 12/26/2015   On amlodipine 2.5 mg daily, chlorthalidone 50 mg daily, metoprolol succinate 50 mg daily.  Assessment BP goal: 140/90 BP control:  Plan Medications: Other:

## 2016-05-21 NOTE — Assessment & Plan Note (Deleted)
PFT (03/30/2009): FVC 67%, FEV1 48%, FEV1/FVC 52%, TLC 104%, DLCO 71%.  On fluticasone-salmeterol 500-50 twice daily with albuterol rescue inhaler.  No exacerbations for which she has sought medical care.  -Continue current meds

## 2016-05-21 NOTE — Progress Notes (Deleted)
   CC: ***  HPI:  [name] is a 70 y.o. woman with history of HTN, COPD, and depression who presents for regularly scheduled visit for management of HTN.  Please see A&P for status of her chronic medical conditions.  Falls*** Incontinence***  Past Medical History:  Diagnosis Date  . Cataract of both eyes 01/18/2009   Right surgically repaired  . 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  . Depression 06/08/2007  . Essential hypertension, benign 06/08/2007  . GERD 01/18/2009  . Osteoporosis 12/31/2012   Left femur had T score -2.5 on DEXA scan 02/26/2009.   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 Sarcoidosis ([place]) 02/05/2009   PFTs 03/30/2009: FVC 67%, FEV1 48%, FEV1/FVC 52%, TLC 104%, DLCO 71%.    Review of Systems:  ***  Physical Exam:  There were no vitals filed for this visit. ***  Assessment & Plan:   See Encounters Tab for problem based charting.  Patient {GC/GE:[id number]::"discussed with","seen with"} Dr. {NAMES:[id number]::"Butcher","Granfortuna","E. Hoffman","Klima","Mullen","Narendra","Vincent"}

## 2016-05-21 NOTE — Assessment & Plan Note (Deleted)
Depression screen [facility] 2/9 03/21/2016 12/26/2015 10/02/2015 02/06/2015 09/12/2014  Decreased Interest 0 3 2 0 1  Down, Depressed, Hopeless 0 3 2 0 1  PHQ - 2 Score 0 6 4 0 2  Altered sleeping - 3 3 - 1  Tired, decreased energy - 3 3 - 2  Change in appetite - 3 3 - 0  Feeling bad or failure about yourself  - 0 0 - 0  Trouble concentrating - 0 0 - 0  Moving slowly or fidgety/restless - 3 3 - 3  Suicidal thoughts - 0 0 - 0  PHQ-9 Score - 18 16 - 8  Difficult doing work/chores - Somewhat difficult Not difficult at all - -   Current medications: mirtazapine 30 mg at night Previous medications: none  Worsening depression symptoms following death of husband, thought she reports some mild symptoms prior.  Diagnoses in 09/2015.  Assessment   Plan  Medications:  Other:

## 2016-05-23 ENCOUNTER — Telehealth: Payer: Self-pay | Admitting: Internal Medicine

## 2016-05-23 NOTE — Telephone Encounter (Signed)
AP. REMINDER  CALL, LMTCB °

## 2016-05-26 ENCOUNTER — Encounter: Payer: Medicare HMO | Admitting: Internal Medicine

## 2016-06-17 ENCOUNTER — Other Ambulatory Visit: Payer: Self-pay | Admitting: *Deleted

## 2016-06-17 DIAGNOSIS — I10 Essential (primary) hypertension: Secondary | ICD-10-CM

## 2016-06-17 MED ORDER — METOPROLOL SUCCINATE ER 50 MG PO TB24: ORAL_TABLET | ORAL | 0 refills | Status: DC

## 2016-07-08 ENCOUNTER — Other Ambulatory Visit: Payer: Self-pay | Admitting: Student in an Organized Health Care Education/Training Program

## 2016-07-22 ENCOUNTER — Other Ambulatory Visit: Payer: Self-pay | Admitting: *Deleted

## 2016-07-23 MED ORDER — PRAVASTATIN SODIUM 40 MG PO TABS: 40.0000 mg | ORAL_TABLET | Freq: Every evening | ORAL | 3 refills | Status: DC

## 2016-07-24 ENCOUNTER — Other Ambulatory Visit: Payer: Self-pay | Admitting: *Deleted

## 2016-07-26 MED ORDER — CHLORTHALIDONE 50 MG PO TABS: 50.0000 mg | ORAL_TABLET | Freq: Every day | ORAL | 3 refills | Status: DC

## 2016-08-17 NOTE — Assessment & Plan Note (Addendum)
Has had upper respiratory symptoms of cough and rhinorrhea on and off since November, with expose to sick grandchildren.  Her dyspnea and wheezing have been worse, and she is using albuterol daily.   Current medications: Advair 500-50,  albuterol PRN  A/P COPD symptoms uncontrolled, using rescue inhaler daily.  Not currently taking LAMA. -Continue Advair 500-50, albuterol PRN -Add Spiriva daily

## 2016-08-17 NOTE — Assessment & Plan Note (Addendum)
Vaccinations today: -Pneumonia vaccine (Prevnar-13) -Influenza vaccine

## 2016-08-17 NOTE — Assessment & Plan Note (Addendum)
BP Readings from Last 3 Encounters:  08/18/16 130/69  03/21/16 (!) 147/66  12/26/15 122/81   Lab Results  Component Value Date   CREATININE 1.20 (H) 12/26/2015   Lab Results  Component Value Date   K 4.3 12/26/2015    Current medications: metoprolol succinate 50 mg daily, amlodipine 2.5 mg daily, chlorthalidone 50 mg daily  Assessment BP goal: 140/90 BP control: controlled  Plan Medications: continue current meds Other:

## 2016-08-17 NOTE — Progress Notes (Signed)
   CC: "I get up just about every hour at night to go to the bathroom."  HPI:  [name] is a 71 y.o. woman with history of HTN, COPD, GERD, and depression who presents for management of hypertension.  Please see A&P for status of the patient's chronic medical conditions.   Past Medical History:  Diagnosis Date  . Cataract of both eyes 01/18/2009   Right surgically repaired  . 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  . Depression 06/08/2007  . Essential hypertension, benign 06/08/2007  . GERD 01/18/2009  . Osteoporosis 12/31/2012   Left femur had T score -2.5 on DEXA scan 02/26/2009.   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 Sarcoidosis ([place]) 02/05/2009   PFTs 03/30/2009: FVC 67%, FEV1 48%, FEV1/FVC 52%, TLC 104%, DLCO 71%.    Review of Systems:  Review of Systems  Constitutional: Negative for chills and fever.  Respiratory: Positive for cough, shortness of breath and wheezing.   Cardiovascular: Negative for chest pain and palpitations.  Genitourinary: Positive for frequency and urgency.  Psychiatric/Behavioral: Negative for depression. The patient has insomnia.     Physical Exam:  Vitals:   08/18/16 1438  BP: 130/69  Pulse: 74  Temp: 98.1 F (36.7 C)  TempSrc: Oral  SpO2: 98%  Weight: 118 lb 6.4 oz (53.7 kg)   Wt Readings from Last 3 Encounters:  08/18/16 118 lb 6.4 oz (53.7 kg)  03/21/16 116 lb 6.4 oz (52.8 kg)  12/26/15 110 lb 6.4 oz (50.1 kg)   Physical Exam  Constitutional: She is oriented to person, place, and time.  Pleasant, well dressed, well groomed woman, normal weight, appears stated age  Cardiovascular: Normal rate and regular rhythm.   Pulmonary/Chest: Effort normal and breath sounds normal. She has no wheezes.  Neurological: She is alert and oriented to person, place, and time.  Psychiatric: She has a normal mood and affect. Her behavior is normal.     Assessment  & Plan:   See Encounters Tab for problem based charting.  Patient discussed with Dr. Eppie Gibson

## 2016-08-17 NOTE — Assessment & Plan Note (Addendum)
In 12/2015, she reported symptoms of depressed mood, decreased energy, poor sleep, and anxiety, and mirtazapine was increased to 30 mg QHS.  This followed the death of her husband last year.  She continues to report poor sleep, but this may be due to urinary frequency/nocturia.  She denies tearfulness, anhedonia, and depressed mood, and does not think mirtazapine is helping with sleep.  Depression screen [facility] 2/9 03/21/2016 12/26/2015 10/02/2015  Decreased Interest 0 3 2  Down, Depressed, Hopeless 0 3 2  PHQ - 2 Score 0 6 4  Altered sleeping - 3 3  Tired, decreased energy - 3 3  Change in appetite - 3 3  Feeling bad or failure about yourself  - 0 0  Trouble concentrating - 0 0  Moving slowly or fidgety/restless - 3 3  Suicidal thoughts - 0 0  PHQ-9 Score - 18 16  Difficult doing work/chores - Somewhat difficult Not difficult at all     Current medications: mirtazapine 30 mg QHS  Previous medications:  Assessment Depression in remission.  Plan -Discontinue mirtazapine Medications:  Other:

## 2016-08-18 ENCOUNTER — Ambulatory Visit (INDEPENDENT_AMBULATORY_CARE_PROVIDER_SITE_OTHER): Payer: Medicare HMO | Admitting: Internal Medicine

## 2016-08-18 ENCOUNTER — Encounter: Payer: Self-pay | Admitting: Internal Medicine

## 2016-08-18 ENCOUNTER — Other Ambulatory Visit: Payer: Self-pay | Admitting: *Deleted

## 2016-08-18 VITALS — BP 130/69 | HR 74 | Temp 98.1°F | Wt 118.4 lb

## 2016-08-18 DIAGNOSIS — J439 Emphysema, unspecified: Secondary | ICD-10-CM | POA: Diagnosis not present

## 2016-08-18 DIAGNOSIS — Z23 Encounter for immunization: Secondary | ICD-10-CM | POA: Diagnosis not present

## 2016-08-18 DIAGNOSIS — E876 Hypokalemia: Secondary | ICD-10-CM

## 2016-08-18 DIAGNOSIS — F339 Major depressive disorder, recurrent, unspecified: Secondary | ICD-10-CM

## 2016-08-18 DIAGNOSIS — I10 Essential (primary) hypertension: Secondary | ICD-10-CM

## 2016-08-18 DIAGNOSIS — N3946 Mixed incontinence: Secondary | ICD-10-CM | POA: Diagnosis not present

## 2016-08-18 DIAGNOSIS — F325 Major depressive disorder, single episode, in full remission: Secondary | ICD-10-CM

## 2016-08-18 DIAGNOSIS — J432 Centrilobular emphysema: Secondary | ICD-10-CM

## 2016-08-18 DIAGNOSIS — Z79899 Other long term (current) drug therapy: Secondary | ICD-10-CM

## 2016-08-18 DIAGNOSIS — Z7951 Long term (current) use of inhaled steroids: Secondary | ICD-10-CM

## 2016-08-18 DIAGNOSIS — R351 Nocturia: Secondary | ICD-10-CM

## 2016-08-18 DIAGNOSIS — Z87891 Personal history of nicotine dependence: Secondary | ICD-10-CM

## 2016-08-18 DIAGNOSIS — Z0000 Encounter for general adult medical examination without abnormal findings: Secondary | ICD-10-CM

## 2016-08-18 MED ORDER — FLUTICASONE-SALMETEROL 500-50 MCG/DOSE IN AEPB: 1.0000 | INHALATION_SPRAY | Freq: Two times a day (BID) | RESPIRATORY_TRACT | 4 refills | Status: DC

## 2016-08-18 MED ORDER — TIOTROPIUM BROMIDE MONOHYDRATE 18 MCG IN CAPS: 18.0000 ug | ORAL_CAPSULE | Freq: Every day | RESPIRATORY_TRACT | 2 refills | Status: DC

## 2016-08-18 MED ORDER — SOLIFENACIN SUCCINATE 5 MG PO TABS: 5.0000 mg | ORAL_TABLET | Freq: Every day | ORAL | 2 refills | Status: AC

## 2016-08-18 NOTE — Assessment & Plan Note (Signed)
Reports very interrupted sleep, getting up to numerous times every night to urinate.  Has incontinence with coughing and laughing, but also reports urinary urgency, frequently unable to make it to the bathroom in time when she needs to urinate.  With her significant nocturia and quality of life impact, I think pharmacotherapy is warranted to treat the urgency component of her mixed incontinence.  -Scheduled voiding -Trial solifenacin 5 mg daily

## 2016-08-18 NOTE — Patient Instructions (Addendum)
For your bladder, lets try a new medicine called Solifenacin to take once a day.  This may help cut down on how urgently you need to go to the bathroom.  However, make sure you go to the bathroom to urinate every 1-2 hours and before bed even if you feel like you don't have to.  You can stop taking Mirtazapine - I don't think you are depressed, and it doesn't seem to be helping with sleep.  For your COPD, I have added a prescription for another inhaler, Spiriva, to take every day.  If you have any troubles affording it, please let me know and we'll see if there are any alternatives.  Your blood pressure is doing great!

## 2016-08-19 LAB — BMP8+ANION GAP
ANION GAP: 20 mmol/L — AB (ref 10.0–18.0)
BUN / CREAT RATIO: 7 — AB (ref 12–28)
BUN: 7 mg/dL — AB (ref 8–27)
CO2: 27 mmol/L (ref 18–29)
CREATININE: 0.94 mg/dL (ref 0.57–1.00)
Calcium: 9.1 mg/dL (ref 8.7–10.3)
Chloride: 85 mmol/L — ABNORMAL LOW (ref 96–106)
GFR calc Af Amer: 71 mL/min/{1.73_m2} (ref >59)
GFR calc non Af Amer: 62 mL/min/{1.73_m2} (ref >59)
Glucose: 86 mg/dL (ref 65–99)
Potassium: 2.7 mmol/L — ABNORMAL LOW (ref 3.5–5.2)
Sodium: 132 mmol/L — ABNORMAL LOW (ref 134–144)

## 2016-08-20 MED ORDER — POTASSIUM CHLORIDE 20 MEQ/15ML (10%) PO SOLN: 20.0000 meq | Freq: Every day | ORAL | 0 refills | Status: DC

## 2016-08-20 NOTE — Progress Notes (Signed)
Case discussed with Dr. Inda Castle at the time of the visit.  We reviewed the resident's history and exam and pertinent patient test results.  I agree with the assessment, diagnosis and plan of care documented in the resident's note.

## 2016-08-20 NOTE — Assessment & Plan Note (Signed)
ADDENDUM 08/20/2016  BMP Latest Ref Rng & Units 08/18/2016 12/26/2015 10/02/2015  Glucose 65 - 99 mg/dL 86 123(H) 96  BUN 8 - 27 mg/dL 7(L) 15 9  Creatinine 0.57 - 1.00 mg/dL 0.94 1.20(H) 0.80  BUN/Creat Ratio 12 - 28 7(L) 13 11  Sodium 134 - 144 mmol/L 132(L) 133(L) 136  Potassium 3.5 - 5.2 mmol/L 2.7(L) 4.3 3.0(L)  Chloride 96 - 106 mmol/L 85(L) 88(L) 88(L)  CO2 18 - 29 mmol/L 27 23 29   Calcium 8.7 - 10.3 mg/dL 9.1 9.2 8.9   Significant asymptomatic hypokalemia.  Called patient, and she reported that she forgot to mention that she has had difficulties swallowing "those big pills" and has not taken them for a month or two.  -KCl solution 100 mEq for 1 day, then 20 mEq daily -Repeat BMP in 1 week

## 2016-08-20 NOTE — Addendum Note (Signed)
Addended by: Rica Records on: 08/20/2016 11:34 AM   Modules accepted: Orders

## 2016-09-15 ENCOUNTER — Telehealth: Payer: Self-pay | Admitting: *Deleted

## 2016-09-15 NOTE — Telephone Encounter (Signed)
[facility] Cetir[facility]ine w/c is not on patien'ts medlist. LM to call back for clarification.

## 2016-09-22 NOTE — Telephone Encounter (Signed)
No answer, no message.

## 2016-10-11 ENCOUNTER — Other Ambulatory Visit: Payer: Self-pay | Admitting: Internal Medicine

## 2016-10-11 DIAGNOSIS — I10 Essential (primary) hypertension: Secondary | ICD-10-CM

## 2016-10-27 ENCOUNTER — Other Ambulatory Visit: Payer: Self-pay | Admitting: Internal Medicine

## 2016-10-27 DIAGNOSIS — E876 Hypokalemia: Secondary | ICD-10-CM

## 2016-10-28 ENCOUNTER — Encounter: Payer: Self-pay | Admitting: Internal Medicine

## 2016-11-12 ENCOUNTER — Other Ambulatory Visit: Payer: Self-pay | Admitting: Internal Medicine

## 2016-11-12 ENCOUNTER — Other Ambulatory Visit (INDEPENDENT_AMBULATORY_CARE_PROVIDER_SITE_OTHER): Payer: Medicare HMO

## 2016-11-12 DIAGNOSIS — E876 Hypokalemia: Secondary | ICD-10-CM | POA: Diagnosis not present

## 2016-11-12 DIAGNOSIS — E871 Hypo-osmolality and hyponatremia: Secondary | ICD-10-CM | POA: Diagnosis not present

## 2016-11-13 ENCOUNTER — Other Ambulatory Visit: Payer: Self-pay | Admitting: Internal Medicine

## 2016-11-13 DIAGNOSIS — E871 Hypo-osmolality and hyponatremia: Secondary | ICD-10-CM

## 2016-11-13 LAB — BMP8+ANION GAP
Anion Gap: 19 mmol/L — ABNORMAL HIGH (ref 10.0–18.0)
BUN/Creatinine Ratio: 6 — ABNORMAL LOW (ref 12–28)
BUN: 5 mg/dL — AB (ref 8–27)
CALCIUM: 9.4 mg/dL (ref 8.7–10.3)
CHLORIDE: 78 mmol/L — AB (ref 96–106)
CO2: 27 mmol/L (ref 18–29)
Creatinine, Ser: 0.83 mg/dL (ref 0.57–1.00)
GFR calc Af Amer: 83 mL/min/{1.73_m2} (ref >59)
GFR calc non Af Amer: 72 mL/min/{1.73_m2} (ref >59)
GLUCOSE: 82 mg/dL (ref 65–99)
Potassium: 3.1 mmol/L — ABNORMAL LOW (ref 3.5–5.2)
Sodium: 124 mmol/L — ABNORMAL LOW (ref 134–144)

## 2016-11-13 NOTE — Addendum Note (Signed)
Addended by: Truddie Crumble on: 11/13/2016 08:47 AM   Modules accepted: Orders

## 2016-11-17 LAB — SPECIMEN STATUS REPORT

## 2016-11-17 LAB — OSMOLALITY: OSMOLALITY MEAS: 252 mosm/kg — AB (ref 280–301)

## 2017-01-16 ENCOUNTER — Encounter: Payer: Self-pay | Admitting: *Deleted

## 2017-02-16 ENCOUNTER — Other Ambulatory Visit: Payer: Self-pay | Admitting: Internal Medicine

## 2017-04-14 ENCOUNTER — Encounter: Payer: Medicare HMO | Admitting: Internal Medicine

## 2017-05-18 ENCOUNTER — Encounter: Payer: Medicare HMO | Admitting: Internal Medicine

## 2017-05-19 ENCOUNTER — Other Ambulatory Visit: Payer: Self-pay | Admitting: *Deleted

## 2017-05-19 NOTE — Telephone Encounter (Signed)
Received refill request from pt's pharmacy for Klor-con 72meq tablets take one daily #30.  Per pt's medication profile, she should be on the solution.  Attempted to contact pt to confirm-no answer, hippa compliant message left on recorder.Hayley Hidden Cassady10/9/20182:09 PM

## 2017-05-21 ENCOUNTER — Other Ambulatory Visit: Payer: Self-pay | Admitting: *Deleted

## 2017-05-21 MED ORDER — PANTOPRAZOLE SODIUM 40 MG PO TBEC: 40.0000 mg | DELAYED_RELEASE_TABLET | Freq: Every day | ORAL | 0 refills | Status: DC

## 2017-05-21 MED ORDER — POTASSIUM CHLORIDE 20 MEQ/15ML (10%) PO SOLN: 20.0000 meq | Freq: Every day | ORAL | 1 refills | Status: DC

## 2017-05-22 NOTE — Telephone Encounter (Signed)
Pt has an appt 11/19 with PCP.

## 2017-06-16 ENCOUNTER — Telehealth: Payer: Self-pay | Admitting: *Deleted

## 2017-06-16 MED ORDER — POTASSIUM CHLORIDE ER 10 MEQ PO TBCR: 20.0000 meq | EXTENDED_RELEASE_TABLET | Freq: Every day | ORAL | 2 refills | Status: DC

## 2017-06-16 NOTE — Telephone Encounter (Addendum)
Call made to pharmacy-to see if rx went through-rx not at pharmacy yet, will call back later to check.Despina Hidden Cassady11/6/20182:45 PM

## 2017-06-16 NOTE — Telephone Encounter (Signed)
Changed to 24meq tabs, hope this helped.  With the new epic change I can't see what's covered and what isn't like I used to.

## 2017-06-16 NOTE — Telephone Encounter (Signed)
Call made to pharmacy after receiving refill request for klor-con 30meq tabs.  Pharmacy informed that rx for the solution should be on file  Pharmacist ran rx and total cost was over $100.  Recommended writing for potassium 10 meq which comes in capsules taking two capsules daily.  Spoke with pt and instructed her to keep already scheduled appt on 11/19 @ 1515 and to make her aware of possible medication change.  Will send request to pcp, please advise.Hayley Hidden Cassady11/6/201812:53 PM

## 2017-06-29 ENCOUNTER — Ambulatory Visit (INDEPENDENT_AMBULATORY_CARE_PROVIDER_SITE_OTHER): Payer: Medicare HMO | Admitting: Internal Medicine

## 2017-06-29 ENCOUNTER — Encounter: Payer: Self-pay | Admitting: Internal Medicine

## 2017-06-29 ENCOUNTER — Encounter (INDEPENDENT_AMBULATORY_CARE_PROVIDER_SITE_OTHER): Payer: Self-pay

## 2017-06-29 ENCOUNTER — Other Ambulatory Visit: Payer: Self-pay

## 2017-06-29 VITALS — BP 120/75 | HR 84 | Temp 98.2°F | Ht 65.0 in | Wt 123.2 lb

## 2017-06-29 DIAGNOSIS — Z833 Family history of diabetes mellitus: Secondary | ICD-10-CM

## 2017-06-29 DIAGNOSIS — R0982 Postnasal drip: Secondary | ICD-10-CM

## 2017-06-29 DIAGNOSIS — Z23 Encounter for immunization: Secondary | ICD-10-CM

## 2017-06-29 DIAGNOSIS — J439 Emphysema, unspecified: Secondary | ICD-10-CM | POA: Diagnosis not present

## 2017-06-29 DIAGNOSIS — E876 Hypokalemia: Secondary | ICD-10-CM

## 2017-06-29 DIAGNOSIS — Z823 Family history of stroke: Secondary | ICD-10-CM | POA: Diagnosis not present

## 2017-06-29 DIAGNOSIS — I10 Essential (primary) hypertension: Secondary | ICD-10-CM | POA: Diagnosis not present

## 2017-06-29 DIAGNOSIS — Z809 Family history of malignant neoplasm, unspecified: Secondary | ICD-10-CM

## 2017-06-29 DIAGNOSIS — Z8249 Family history of ischemic heart disease and other diseases of the circulatory system: Secondary | ICD-10-CM | POA: Diagnosis not present

## 2017-06-29 DIAGNOSIS — Z79899 Other long term (current) drug therapy: Secondary | ICD-10-CM

## 2017-06-29 DIAGNOSIS — R252 Cramp and spasm: Secondary | ICD-10-CM

## 2017-06-29 DIAGNOSIS — Z87891 Personal history of nicotine dependence: Secondary | ICD-10-CM

## 2017-06-29 DIAGNOSIS — E871 Hypo-osmolality and hyponatremia: Secondary | ICD-10-CM | POA: Diagnosis not present

## 2017-06-29 DIAGNOSIS — Z825 Family history of asthma and other chronic lower respiratory diseases: Secondary | ICD-10-CM

## 2017-06-29 DIAGNOSIS — J432 Centrilobular emphysema: Secondary | ICD-10-CM

## 2017-06-29 DIAGNOSIS — Z0000 Encounter for general adult medical examination without abnormal findings: Secondary | ICD-10-CM

## 2017-06-29 MED ORDER — ALBUTEROL SULFATE HFA 108 (90 BASE) MCG/ACT IN AERS: 2.0000 | INHALATION_SPRAY | Freq: Four times a day (QID) | RESPIRATORY_TRACT | 4 refills | Status: DC | PRN

## 2017-06-29 MED ORDER — FLUTICASONE-SALMETEROL 500-50 MCG/DOSE IN AEPB: 1.0000 | INHALATION_SPRAY | Freq: Two times a day (BID) | RESPIRATORY_TRACT | 4 refills | Status: DC

## 2017-06-29 NOTE — Patient Instructions (Addendum)
It was great to see you today.  We will keep your bp medicine the same.  Additionally, we have discontinued your spiriva and refilled your albuterol and advair.  We will check some labwork to see how your potassium and sodium levels are doing, abnormalities in these could be contributing to the spasms happening in your legs

## 2017-06-29 NOTE — Progress Notes (Signed)
DX:IPJA nasal drip, follow up on HTN, COPD, hypokalemia  HPI:  [name] is a 71 y.o. with PMH below here with complaint of post nasal drip, and here to follow up on chronic medical conditions above.    Please see A&P for status of the patient's chronic medical conditions  Past Medical History:  Diagnosis Date  . Cataract of both eyes 01/18/2009   Right surgically repaired  . 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  . Depression 06/08/2007  . Essential hypertension, benign 06/08/2007  . GERD 01/18/2009  . Osteoporosis 12/31/2012   Left femur had T score -2.5 on DEXA scan 02/26/2009.   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 Sarcoidosis 02/05/2009   PFTs 03/30/2009: FVC 67%, FEV1 48%, FEV1/FVC 52%, TLC 104%, DLCO 71%.   Review of Systems:  ROS: Pulmonary: pt denies increased work of breathing, shortness of breath,  Cardiac: pt denies palpitations, chest pain,  Abdominal: pt denies abdominal pain, nausea, vomiting, or diarrhea  Physical Exam:  Real blood pressure was 120/75 but not documented Vitals:   06/29/17 1553  BP: (!) 160/133  Pulse: 84  Temp: 98.2 F (36.8 C)  TempSrc: Oral  SpO2: 99%  Weight: 123 lb 3.2 oz (55.9 kg)  Height: 5\' 5"  (1.651 m)   Physical Exam  Constitutional: No distress.  Cardiovascular: Normal rate, regular rhythm and normal heart sounds. Exam reveals no gallop and no friction rub.  No murmur heard. Pulmonary/Chest: Effort normal and breath sounds normal. No respiratory distress. She has no wheezes. She has no rales. She exhibits no tenderness.  Abdominal: Soft. Bowel sounds are normal. She exhibits no distension and no mass. There is no tenderness. There is no rebound and no guarding.  Neurological: She is alert.  Skin: She is not diaphoretic.    Social History   Socioeconomic History  . Marital status: Married    Spouse name: Not on file  . Number of  children: Not on file  . Years of education: Not on file  . Highest education level: Not on file  Social Needs  . Financial resource strain: Not on file  . Food insecurity - worry: Not on file  . Food insecurity - inability: Not on file  . Transportation needs - medical: Not on file  . Transportation needs - non-medical: Not on file  Occupational History  . Not on file  Tobacco Use  . Smoking status: Former Smoker    Packs/day: 0.25    Years: 30.00    Pack years: 7.50    Last attempt to quit: 05/11/2010    Years since quitting: 7.1  Substance and Sexual Activity  . Alcohol use: Yes    Alcohol/week: 0.0 oz    Comment: Occasionally  . Drug use: No  . Sexual activity: Not on file  Other Topics Concern  . Not on file  Social History Narrative   Married with 4 children. Retired, previously worked as a Scientist, clinical (histocompatibility and immunogenetics).    Family History  Problem Relation Age of Onset  . Cancer Mother   . Heart disease Mother   . Stroke Father   . Cancer Sister   . Heart disease Sister   . Asthma Daughter   . Cancer Maternal Aunt   . Cancer Other   . Diabetes Other   . Hypertension Other     Assessment & Plan:   See Encounters Tab for problem  based charting.  Patient seen with Dr. Eppie Gibson

## 2017-06-30 LAB — BMP8+ANION GAP
Anion Gap: 17 mmol/L (ref 10.0–18.0)
BUN / CREAT RATIO: 8 — AB (ref 12–28)
BUN: 10 mg/dL (ref 8–27)
CALCIUM: 8.8 mg/dL (ref 8.7–10.3)
CO2: 23 mmol/L (ref 20–29)
Chloride: 89 mmol/L — ABNORMAL LOW (ref 96–106)
Creatinine, Ser: 1.28 mg/dL — ABNORMAL HIGH (ref 0.57–1.00)
GFR calc non Af Amer: 42 mL/min/{1.73_m2} — ABNORMAL LOW (ref >59)
GFR, EST AFRICAN AMERICAN: 49 mL/min/{1.73_m2} — AB (ref >59)
GLUCOSE: 87 mg/dL (ref 65–99)
POTASSIUM: 4 mmol/L (ref 3.5–5.2)
Sodium: 129 mmol/L — ABNORMAL LOW (ref 134–144)

## 2017-06-30 MED ORDER — CETIRIZINE HCL 5 MG PO TABS: 5.0000 mg | ORAL_TABLET | Freq: Every day | ORAL | 2 refills | Status: DC

## 2017-06-30 NOTE — Assessment & Plan Note (Addendum)
Pt has symptoms of nasal congestion and post nasal drip.  She reports her COPD has been doing relatively well with occasional mild shortness of breath relieved with albuterol.  She has been using albuterol daily and has been out of her Advair for a week or so.  She does have spiriva but does not use it because it makes her throat itch.    -refilled albuterol and Advair, d/ced spiriva -Added 5mg  zyrtec daily

## 2017-06-30 NOTE — Assessment & Plan Note (Addendum)
Blood pressure today was 120/75.  Pt has been out of her amlodipine 2.5mg  for the last 3 weeks.  She currently takes chlorthalidone 50mg  daily and has been taking this medication for at least ten years.  However, around 7 months ago pt was found to have hypokalemia and hyponatremia.  She was given potassium supplementation and told to follow up.  Given how good her blood pressure is doing on just the chlorthalidone, It may be reasonable to switch her to amlodipine alone as her new single agent and see how if her electrolytes normalize.  She is a thin woman and likely takes in few solutes, her serum osmolality was low at the time the abnormalities were noted.    -Repeat BMP for this visit showed improvement in potassium with supplementation but continued hyponatremia and in increased creatinine to 1.28 -Will try to contact patient and let her know about switching regimen per above

## 2017-06-30 NOTE — Assessment & Plan Note (Addendum)
Potassium within normal range on repeat BMP today.  Pt taking 20 meq of supplementation daily.  This was thought to be the cause of her leg spasms before lab resulted.  However, a mild hyponatremia still present which could be contributing.   -Continue supplement for now until chlorthalidone discontinued

## 2017-07-01 ENCOUNTER — Encounter: Payer: Self-pay | Admitting: Gastroenterology

## 2017-07-02 NOTE — Progress Notes (Signed)
Patient ID: [name], female   DOB: 1946-05-07, 71 y.o.   MRN: [id number]  I saw and evaluated the patient. I personally confirmed the key portions of Dr. Maudie Flakes history and exam and reviewed pertinent patient test results. The assessment, diagnosis, and plan were formulated together and I agree with the documentation in the resident's note.

## 2017-07-17 ENCOUNTER — Other Ambulatory Visit: Payer: Self-pay | Admitting: *Deleted

## 2017-07-17 MED ORDER — MIRTAZAPINE 15 MG PO TABS: 15.0000 mg | ORAL_TABLET | Freq: Every day | ORAL | 5 refills | Status: DC

## 2017-07-27 ENCOUNTER — Ambulatory Visit (INDEPENDENT_AMBULATORY_CARE_PROVIDER_SITE_OTHER): Payer: Medicare HMO | Admitting: Internal Medicine

## 2017-07-27 ENCOUNTER — Encounter: Payer: Self-pay | Admitting: Internal Medicine

## 2017-07-27 VITALS — BP 145/74 | HR 81 | Temp 98.0°F | Wt 123.4 lb

## 2017-07-27 DIAGNOSIS — F329 Major depressive disorder, single episode, unspecified: Secondary | ICD-10-CM

## 2017-07-27 DIAGNOSIS — Z87828 Personal history of other (healed) physical injury and trauma: Secondary | ICD-10-CM

## 2017-07-27 DIAGNOSIS — I10 Essential (primary) hypertension: Secondary | ICD-10-CM

## 2017-07-27 DIAGNOSIS — M160 Bilateral primary osteoarthritis of hip: Secondary | ICD-10-CM

## 2017-07-27 DIAGNOSIS — Z87891 Personal history of nicotine dependence: Secondary | ICD-10-CM | POA: Diagnosis not present

## 2017-07-27 DIAGNOSIS — G8929 Other chronic pain: Secondary | ICD-10-CM

## 2017-07-27 DIAGNOSIS — J449 Chronic obstructive pulmonary disease, unspecified: Secondary | ICD-10-CM | POA: Diagnosis not present

## 2017-07-27 DIAGNOSIS — J439 Emphysema, unspecified: Secondary | ICD-10-CM

## 2017-07-27 DIAGNOSIS — Z9889 Other specified postprocedural states: Secondary | ICD-10-CM | POA: Diagnosis not present

## 2017-07-27 MED ORDER — AMLODIPINE BESYLATE 2.5 MG PO TABS: ORAL_TABLET | ORAL | 2 refills | Status: DC

## 2017-07-27 MED ORDER — ALBUTEROL SULFATE HFA 108 (90 BASE) MCG/ACT IN AERS: 2.0000 | INHALATION_SPRAY | Freq: Four times a day (QID) | RESPIRATORY_TRACT | 4 refills | Status: DC | PRN

## 2017-07-27 MED ORDER — PRAVASTATIN SODIUM 40 MG PO TABS: 40.0000 mg | ORAL_TABLET | Freq: Every evening | ORAL | 3 refills | Status: DC

## 2017-07-27 MED ORDER — FLUTICASONE-SALMETEROL 500-50 MCG/DOSE IN AEPB: 1.0000 | INHALATION_SPRAY | Freq: Two times a day (BID) | RESPIRATORY_TRACT | 4 refills | Status: DC

## 2017-07-27 MED ORDER — MELOXICAM 5 MG PO CAPS: 5.0000 mg | ORAL_CAPSULE | Freq: Every day | ORAL | 2 refills | Status: DC

## 2017-07-27 NOTE — Assessment & Plan Note (Signed)
Patient reports worsening SOB, dry cough, and wheezing since stopping use of albuterol and Advair inhalers a few weeks ago. Diffuse wheezing and dry cough appreciated on exam. Patient currently not in acute respiratory distress and does not require breathing treatment at this time. It seems that patient was unable to fill [facility]. I think patient's symptoms will improve if this therapy is restarted. Patient's family request that [facility] prescriptions be [facility] Prescriptions provided.  Plan: -Restart Advair BID and albuterol inhaler or nebulizer PRN -RTC for follow up with PCP on 10/05/2016 or if symptoms worsen after restarting usual medications

## 2017-07-27 NOTE — Assessment & Plan Note (Addendum)
Patient's BP elevated above goal of 140/90 today. Patient has not been taking amlodipine 2.5 mg daily as prescribed, but has continued metoprolol XL 50 mg and chlorthalidone 50 mg daily. Patient was instructed to make the changes previously encouraged by PCP at last visit, which include initiation of amlodipine 2.5 mg daily as sole anti-HTN agent and discontinuation of chlorthalidone 50 mg and 40 meq K+ daily. Patient wishes to change pharmacy to [facility], script sent to this institution.   Plan: -Discontinue chlorthalidone 50 mg and 40 meq K+ supplementation (medications discarded in clinic today) -Restart amlodipine 2.5 mg daily, script provided -RTC on 10/05/16 for follow up of BP and BMP to check Cr/electrolytes on above regimen.  -Consider restarting chlorthalidone if suspect Na+ sensitive HTN or increasing amlodipine at next visit if BP uncontrolled.

## 2017-07-27 NOTE — Patient Instructions (Addendum)
FOLLOW-UP INSTRUCTIONS When: Oct 05 2016 For: Blood pressure and blood work follow up What to bring: All prescription medications  Thank you for seeing [place] in the clinic today!  You were evaluated for high blood pressure. Please stop taking chlorthalidone and potassium supplementation. Please start taking amlodipine 2.5 mg daily. You will have to follow up with your primary care physician for blood work and measurement of blood pressure on this new medication.   For your hip pain related to osteoarthritis, please start taking meloxicam 5 mg daily. Please take this medication with food, as it can sometimes upset your stomach. Please follow up with your primary care provider regarding how this pain medication works for you.   Please return to the clinic on Feburary 25 for follow up of your blood pressure and hip pain.   If you have any questions or concerns, please call our clinic at [phone] between the hours of 9am-5pm. If you have a problem after these hours, please call [phone] and ask for the internal medicine resident on call. If you feel you are having a medical emergency please call 911.   Thanks, Dr. Larena Glassman Christon Gallaway

## 2017-07-27 NOTE — Progress Notes (Signed)
   CC: HTN follow up  HPI:  [name] is a 71 y.o. with PMH of HTN, COPD, and depression who presents for follow up of her blood pressure. Per chart review patient was to switch her BP regimen at the request of PCP about 1 month ago. She was to stop taking chlorthalidone 50 mg daily and to stop concurrent K+ supplementation. She was told to only take metoprolol XL 50 mg and amlodipine 2.5 mg daily. She was then to follow up for evaluation of BP and electrolytes/Cr on this regimen.   Patient was unable to make the above medication changes prescribed at her last visit and has not been taking amlodipine daily. Patient also states that she has been out of her inhalers for quite sometime and has not used albuterol or Advair since last month. She endorses increased dry cough, shortness of breath with exertion, and wheezing, which worsen with exertion and at night. Patient states that her albuterol inhaler helps with cough/SOB but she ran out recently and has not refilled this prescription. Patient also reports, chronic bilateral hip pain 2/2 osteoarthritis and multiple previous injuries/surgeries. Patient reports pain worsens with movement and is very bothersome at night and (along with her cough) prohibit her from sleeping.   Past Medical History: Past Medical History:  Diagnosis Date  . Cataract of both eyes 01/18/2009   Right surgically repaired  . 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  . Depression 06/08/2007  . Essential hypertension, benign 06/08/2007  . GERD 01/18/2009  . Osteoporosis 12/31/2012   Left femur had T score -2.5 on DEXA scan 02/26/2009.   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 Sarcoidosis 02/05/2009   PFTs 03/30/2009: FVC 67%, FEV1 48%, FEV1/FVC 52%, TLC 104%, DLCO 71%.   Review of Systems:   Patient endorses bilateral hip pain and dry cough, as per HPI Patient denies chest pain, shortness of  breath, abdominal pain, diaphoresis, nausea/vomiting, lower extremity swelling, and change in bowel/bladder habits.  Physical Exam:  Vitals:   07/27/17 1108  BP: (!) 145/74  Pulse: 81  Temp: 98 F (36.7 C)  TempSrc: Oral  SpO2: 100%  Weight: 123 lb 6.4 oz (56 kg)   Physical Exam  Constitutional:  Thin elderly woman sitting comfortably in wheelchair in no acute distress  HENT:  Mouth/Throat: Oropharynx is clear and moist. No oropharyngeal exudate.  Cardiovascular: Normal rate, regular rhythm and intact distal pulses. Exam reveals no friction rub.  No murmur heard. Respiratory:  Speaking in full sentences. No accessory muscle use or nasal flaring. Scattered wheezing throughout all lung fields, with intermittent dry cough during deep inhalation on exam. No crackles appreciated.  GI: Soft. She exhibits no distension. There is no tenderness.  Musculoskeletal: She exhibits no edema (of bilateral lower extremities) or tenderness (of bilateral lower extremities).  Skin: Skin is warm and dry. No rash noted. No erythema.    Assessment & Plan:   See Encounters Tab for problem based charting.  Patient seen with Dr. Eppie Gibson.

## 2017-07-27 NOTE — Assessment & Plan Note (Signed)
Patient complains of bilateral pain 2/2 OA. Treating with Tylenol but would like additional medication for pain management today. Will start low-dose meloxicam today (5mg  daily with breakfast) to help with pain 2/2 OA. Will need to monitor this medication and patient's symptoms closely to see how she is doing at next visit. Would suggest outpatient PT and or OT evaluation at follow up if pain not improving since daughters who live with patient say pain limits activities of daily living.   Plan: -Meloxicam 5 mg daily -PT/OT referral if not improving -RTC for f/u with PCP on 10/04/16

## 2017-07-30 ENCOUNTER — Telehealth: Payer: Self-pay | Admitting: *Deleted

## 2017-07-30 MED ORDER — MELOXICAM 7.5 MG PO TABS: 7.5000 mg | ORAL_TABLET | Freq: Every day | ORAL | 2 refills | Status: DC

## 2017-07-30 NOTE — Telephone Encounter (Signed)
Received fax from pt's pharmacy with the following message regarding vivlodex (meloxicam) 5mg  capsules:  "Capsules are not covered by pt's insurance.  Can we change the prescription to tablets?"  Will send request to ordering MD for review and medication change if appropriate.  Please advise.Regenia Skeeter, Darlene Cassady12/20/201810:04 AM

## 2017-07-31 NOTE — Progress Notes (Signed)
I saw and evaluated the patient.  I personally confirmed the key portions of Dr. Nedrud's history and exam and reviewed pertinent patient test results.  The assessment, diagnosis, and plan were formulated together and I agree with the documentation in the resident's note. 

## 2017-08-14 ENCOUNTER — Other Ambulatory Visit: Payer: Self-pay | Admitting: *Deleted

## 2017-08-14 MED ORDER — MIRTAZAPINE 15 MG PO TABS: 15.0000 mg | ORAL_TABLET | Freq: Every day | ORAL | 1 refills | Status: DC

## 2017-08-14 NOTE — Telephone Encounter (Signed)
Will do! thanks

## 2017-08-17 ENCOUNTER — Telehealth: Payer: Self-pay | Admitting: *Deleted

## 2017-08-17 NOTE — Telephone Encounter (Signed)
Dr Fuller Plan,  This pt is scheduled for a colon with you 1-31 Thursday for a recall screening- her last colon was 12-15-2000 with you.  There is a note from10/25/2010 from [facility] starting pt needed an OV prior to RS her colon because she had NS a previous hospital colon and then she called and cancelled a 2nd hospital colon stating death in the family. I Just want to make sure she is ok for a PV then Recall colon or do you want her to have an OV?  Please advise, Thanks Lelan Pons

## 2017-08-17 NOTE — Telephone Encounter (Signed)
OK to proceed with direct colonoscopy. Please reinforce the timeframe for procedure cancellation without penalty.

## 2017-08-27 ENCOUNTER — Encounter: Payer: Self-pay | Admitting: Gastroenterology

## 2017-08-27 ENCOUNTER — Ambulatory Visit (AMBULATORY_SURGERY_CENTER): Payer: Self-pay

## 2017-08-27 VITALS — Ht 65.5 in | Wt 121.6 lb

## 2017-08-27 DIAGNOSIS — Z1211 Encounter for screening for malignant neoplasm of colon: Secondary | ICD-10-CM

## 2017-08-27 MED ORDER — NA SULFATE-K SULFATE-MG SULF 17.5-3.13-1.6 GM/177ML PO SOLN: 1.0000 | Freq: Once | ORAL | 0 refills | Status: AC

## 2017-08-27 NOTE — Progress Notes (Signed)
Per pt, no allergies to soy or egg products.Pt not taking any weight loss meds or using  O2 at home.  Pt refused emmi video. 

## 2017-09-07 ENCOUNTER — Other Ambulatory Visit: Payer: Self-pay | Admitting: Internal Medicine

## 2017-09-07 DIAGNOSIS — I10 Essential (primary) hypertension: Secondary | ICD-10-CM

## 2017-09-07 DIAGNOSIS — J432 Centrilobular emphysema: Secondary | ICD-10-CM

## 2017-09-07 MED ORDER — METOPROLOL SUCCINATE ER 50 MG PO TB24: ORAL_TABLET | ORAL | 3 refills | Status: DC

## 2017-09-07 MED ORDER — ALBUTEROL SULFATE HFA 108 (90 BASE) MCG/ACT IN AERS
1.0000 | INHALATION_SPRAY | Freq: Four times a day (QID) | RESPIRATORY_TRACT | 6 refills | Status: DC | PRN
Start: 2017-09-07 — End: 2017-12-22

## 2017-09-07 NOTE — Telephone Encounter (Signed)
Patient requesting refills on prescription, she said one dr took her medicine and she is out blood pressure medicine

## 2017-09-07 NOTE — Telephone Encounter (Signed)
Call from pt -requesting refill on Metoprolol. And stated with [facility], Albuterol  Inhaler costs $45.00 - she would like a different inhaler, one she can afford. Thanks

## 2017-09-07 NOTE — Telephone Encounter (Signed)
Placed refill for generic albuterol inhaler and refilled metoprolol

## 2017-09-08 ENCOUNTER — Other Ambulatory Visit: Payer: Self-pay

## 2017-09-08 NOTE — Telephone Encounter (Signed)
Informed pt Metoprolol and albuterol inh [facility]

## 2017-09-08 NOTE — Telephone Encounter (Signed)
Called pt - no answer; left message med refills /[facility].

## 2017-09-08 NOTE — Telephone Encounter (Signed)
Pt is calling back about med. Please call back.

## 2017-09-10 ENCOUNTER — Encounter: Payer: Medicare HMO | Admitting: Gastroenterology

## 2017-09-18 ENCOUNTER — Other Ambulatory Visit: Payer: Self-pay | Admitting: *Deleted

## 2017-09-18 MED ORDER — PRAVASTATIN SODIUM 40 MG PO TABS: 40.0000 mg | ORAL_TABLET | Freq: Every evening | ORAL | 0 refills | Status: DC

## 2017-09-18 NOTE — Telephone Encounter (Signed)
Refilled, will likely switch to high intensity on next visit

## 2017-10-08 NOTE — Addendum Note (Signed)
Addended by: Hulan Fray on: 10/08/2017 05:22 PM   Modules accepted: Orders

## 2017-10-28 ENCOUNTER — Other Ambulatory Visit: Payer: Self-pay | Admitting: *Deleted

## 2017-10-28 MED ORDER — PRAVASTATIN SODIUM 40 MG PO TABS: 40.0000 mg | ORAL_TABLET | Freq: Every evening | ORAL | 0 refills | Status: DC

## 2017-10-28 NOTE — Telephone Encounter (Signed)
[facility] and chlorthalidone 50mg  once daily.  Per review of pt's medical record, Chlorthalidone was discontinued-so refill request was denied.  Pravastatin request sent to pcp for review. Pt has upcoming appt scheduled on 04/01.Despina Hidden Cassady3/20/20198:52 AM

## 2017-10-31 ENCOUNTER — Other Ambulatory Visit: Payer: Self-pay | Admitting: Internal Medicine

## 2017-11-02 NOTE — Telephone Encounter (Signed)
Will refill short supply in case I need to go with a higher dose, she has an appointment with me in 6 days that it will be important for her to keep.

## 2017-11-03 ENCOUNTER — Other Ambulatory Visit: Payer: Self-pay | Admitting: Internal Medicine

## 2017-11-03 MED ORDER — AMLODIPINE BESYLATE 2.5 MG PO TABS: ORAL_TABLET | ORAL | 0 refills | Status: DC

## 2017-11-03 MED ORDER — PANTOPRAZOLE SODIUM 40 MG PO TBEC: 40.0000 mg | DELAYED_RELEASE_TABLET | Freq: Every day | ORAL | 0 refills | Status: DC

## 2017-11-03 NOTE — Telephone Encounter (Signed)
I did not wish to withhold the patient's amlodipine, there must have been a misunderstanding.  Refilled protonix as well

## 2017-11-09 ENCOUNTER — Encounter: Payer: Medicare HMO | Admitting: Internal Medicine

## 2017-11-09 ENCOUNTER — Encounter: Payer: Self-pay | Admitting: Internal Medicine

## 2017-12-05 ENCOUNTER — Other Ambulatory Visit: Payer: Self-pay | Admitting: Internal Medicine

## 2017-12-11 ENCOUNTER — Other Ambulatory Visit: Payer: Self-pay | Admitting: *Deleted

## 2017-12-15 ENCOUNTER — Ambulatory Visit: Payer: Medicare HMO

## 2017-12-15 ENCOUNTER — Other Ambulatory Visit: Payer: Self-pay | Admitting: *Deleted

## 2017-12-15 DIAGNOSIS — Z961 Presence of intraocular lens: Secondary | ICD-10-CM | POA: Diagnosis not present

## 2017-12-15 DIAGNOSIS — H5703 Miosis: Secondary | ICD-10-CM | POA: Diagnosis not present

## 2017-12-15 DIAGNOSIS — H2512 Age-related nuclear cataract, left eye: Secondary | ICD-10-CM | POA: Diagnosis not present

## 2017-12-15 DIAGNOSIS — H25812 Combined forms of age-related cataract, left eye: Secondary | ICD-10-CM | POA: Diagnosis not present

## 2017-12-15 DIAGNOSIS — H26491 Other secondary cataract, right eye: Secondary | ICD-10-CM | POA: Diagnosis not present

## 2017-12-15 MED ORDER — PRAVASTATIN SODIUM 40 MG PO TABS: 40.0000 mg | ORAL_TABLET | Freq: Every evening | ORAL | 3 refills | Status: DC

## 2017-12-22 ENCOUNTER — Ambulatory Visit (INDEPENDENT_AMBULATORY_CARE_PROVIDER_SITE_OTHER): Payer: Medicare HMO | Admitting: Internal Medicine

## 2017-12-22 ENCOUNTER — Encounter: Payer: Self-pay | Admitting: Internal Medicine

## 2017-12-22 ENCOUNTER — Other Ambulatory Visit: Payer: Self-pay

## 2017-12-22 VITALS — BP 145/72 | HR 119 | Temp 100.0°F | Wt 114.2 lb

## 2017-12-22 DIAGNOSIS — R197 Diarrhea, unspecified: Secondary | ICD-10-CM

## 2017-12-22 DIAGNOSIS — J309 Allergic rhinitis, unspecified: Secondary | ICD-10-CM

## 2017-12-22 DIAGNOSIS — I10 Essential (primary) hypertension: Secondary | ICD-10-CM

## 2017-12-22 DIAGNOSIS — J432 Centrilobular emphysema: Secondary | ICD-10-CM

## 2017-12-22 DIAGNOSIS — Z87891 Personal history of nicotine dependence: Secondary | ICD-10-CM | POA: Diagnosis not present

## 2017-12-22 DIAGNOSIS — E876 Hypokalemia: Secondary | ICD-10-CM | POA: Diagnosis not present

## 2017-12-22 DIAGNOSIS — R0982 Postnasal drip: Secondary | ICD-10-CM

## 2017-12-22 DIAGNOSIS — J301 Allergic rhinitis due to pollen: Secondary | ICD-10-CM

## 2017-12-22 DIAGNOSIS — Z7951 Long term (current) use of inhaled steroids: Secondary | ICD-10-CM | POA: Diagnosis not present

## 2017-12-22 DIAGNOSIS — J439 Emphysema, unspecified: Secondary | ICD-10-CM

## 2017-12-22 DIAGNOSIS — Z79899 Other long term (current) drug therapy: Secondary | ICD-10-CM

## 2017-12-22 LAB — CBC WITH DIFFERENTIAL/PLATELET
Abs Immature Granulocytes: 0 10*3/uL (ref 0.0–0.1)
BASOS PCT: 1 %
Basophils Absolute: 0 10*3/uL (ref 0.0–0.1)
EOS ABS: 0 10*3/uL (ref 0.0–0.7)
EOS PCT: 0 %
HEMATOCRIT: 36.6 % (ref 36.0–46.0)
Hemoglobin: 12.5 g/dL (ref 12.0–15.0)
IMMATURE GRANULOCYTES: 0 %
LYMPHS ABS: 1 10*3/uL (ref 0.7–4.0)
Lymphocytes Relative: 16 %
MCH: 29 pg (ref 26.0–34.0)
MCHC: 34.2 g/dL (ref 30.0–36.0)
MCV: 84.9 fL (ref 78.0–100.0)
MONOS PCT: 6 %
Monocytes Absolute: 0.4 10*3/uL (ref 0.1–1.0)
Neutro Abs: 4.8 10*3/uL (ref 1.7–7.7)
Neutrophils Relative %: 77 %
PLATELETS: 203 10*3/uL (ref 150–400)
RBC: 4.31 MIL/uL (ref 3.87–5.11)
RDW: 16.6 % — AB (ref 11.5–15.5)
WBC: 6.3 10*3/uL (ref 4.0–10.5)

## 2017-12-22 LAB — BASIC METABOLIC PANEL
Anion gap: 16 — ABNORMAL HIGH (ref 5–15)
CALCIUM: 8.3 mg/dL — AB (ref 8.9–10.3)
CHLORIDE: 97 mmol/L — AB (ref 101–111)
CO2: 25 mmol/L (ref 22–32)
CREATININE: 0.9 mg/dL (ref 0.44–1.00)
GFR calc non Af Amer: >60 mL/min (ref >60)
GLUCOSE: 78 mg/dL (ref 65–99)
Potassium: 2.9 mmol/L — ABNORMAL LOW (ref 3.5–5.1)
Sodium: 138 mmol/L (ref 135–145)

## 2017-12-22 LAB — MAGNESIUM: Magnesium: 1.4 mg/dL — ABNORMAL LOW (ref 1.7–2.4)

## 2017-12-22 MED ORDER — POTASSIUM CHLORIDE CRYS ER 20 MEQ PO TBCR
40.0000 meq | EXTENDED_RELEASE_TABLET | Freq: Once | ORAL | Status: AC
  Administered 2017-12-22: 40 meq via ORAL

## 2017-12-22 MED ORDER — LOPERAMIDE HCL 1 MG/5ML PO LIQD: 2.0000 mg | Freq: Four times a day (QID) | ORAL | 0 refills | Status: AC | PRN

## 2017-12-22 MED ORDER — DM-GUAIFENESIN ER 60-1200 MG PO TB12: 1.0000 | ORAL_TABLET | Freq: Two times a day (BID) | ORAL | 0 refills | Status: DC

## 2017-12-22 MED ORDER — MAGNESIUM SULFATE 50 % IJ SOLN: 2.0000 g | Freq: Once | INTRAVENOUS | Status: DC

## 2017-12-22 MED ORDER — ALBUTEROL SULFATE (2.5 MG/3ML) 0.083% IN NEBU: 2.5000 mg | INHALATION_SOLUTION | Freq: Four times a day (QID) | RESPIRATORY_TRACT | 3 refills | Status: DC | PRN

## 2017-12-22 MED ORDER — ALBUTEROL SULFATE HFA 108 (90 BASE) MCG/ACT IN AERS: 1.0000 | INHALATION_SPRAY | Freq: Four times a day (QID) | RESPIRATORY_TRACT | 6 refills | Status: DC | PRN

## 2017-12-22 MED ORDER — FLUTICASONE PROPIONATE 50 MCG/ACT NA SUSP: 1.0000 | Freq: Every day | NASAL | 2 refills | Status: DC

## 2017-12-22 MED ORDER — FLUTICASONE-SALMETEROL 500-50 MCG/DOSE IN AEPB: 1.0000 | INHALATION_SPRAY | Freq: Two times a day (BID) | RESPIRATORY_TRACT | 4 refills | Status: DC

## 2017-12-22 MED ORDER — CETIRIZINE HCL 5 MG PO TABS: 5.0000 mg | ORAL_TABLET | Freq: Every day | ORAL | 2 refills | Status: DC

## 2017-12-22 MED ORDER — METOPROLOL SUCCINATE ER 50 MG PO TB24: ORAL_TABLET | ORAL | 3 refills | Status: DC

## 2017-12-22 MED ORDER — SODIUM CHLORIDE 0.9 % IJ SOLN
1000.0000 mL | Freq: Once | INTRAMUSCULAR | Status: AC
  Administered 2017-12-22: 1000 mL via INTRAVENOUS

## 2017-12-22 NOTE — Assessment & Plan Note (Signed)
BP Readings from Last 3 Encounters:  12/22/17 (!) 145/72  07/27/17 (!) 145/74  06/29/17 120/75   Pt did not take her bp meds today due to illness.  She reports she is out of metoprolol succinate for the last two days which combined with diarrheal illness is a component of her sinus tachycardia today.    -continue amlodipine 2.5mg  -continue metoprolol succinate 50mg  daily

## 2017-12-22 NOTE — Progress Notes (Signed)
CC: diarrhea, sinus congestion, post nasal drip  HPI:  [name] is a 72 y.o. female with PMH below.  She reports feeling poorly for the last week.  She reports a viral illness being passed around between family members living at her house causing diarrhea.  She took some kaopectate.  Felt she took too much got constipated so purchased an enema which has caused her to have diarrhea again.  She can tolerate fluids but has poor appetite which is an acute on chronic issue for her.    Please see A&P for status of the patient's chronic medical conditions  Past Medical History:  Diagnosis Date  . Cataract of both eyes 01/18/2009   Right surgically repaired  . 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  . Depression 06/08/2007  . Essential hypertension, benign 06/08/2007  . GERD 01/18/2009  . Osteoporosis 12/31/2012   Left femur had T score -2.5 on DEXA scan 02/26/2009.   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 Sarcoidosis 02/05/2009   PFTs 03/30/2009: FVC 67%, FEV1 48%, FEV1/FVC 52%, TLC 104%, DLCO 71%.   Review of Systems:   ROS: Pulmonary: pt denies increased work of breathing, shortness of breath,  Cardiac: pt denies palpitations, chest pain,  Abdominal: pt denies abdominal pain, nausea,has experienced vomiting of secretions and diarrhea.  Physical Exam:  Vitals:   12/22/17 1046  BP: (!) 136/115  Pulse: (!) 132  Temp: 98.3 F (36.8 C)  TempSrc: Oral  SpO2: 99%  Weight: 114 lb 3.2 oz (51.8 kg)   Physical Exam  Constitutional: No distress.  HENT:  Nose: Mucosal edema and rhinorrhea present. No sinus tenderness.  Mouth/Throat: No oropharyngeal exudate.  Cardiovascular: Normal rate, regular rhythm and normal heart sounds. Exam reveals no gallop and no friction rub.  No murmur heard. Pulmonary/Chest: Effort normal and breath sounds normal. No respiratory distress. She has no wheezes.  She has no rales. She exhibits no tenderness.  Abdominal: Soft. Bowel sounds are normal. She exhibits no distension and no mass. There is no tenderness. There is no rebound and no guarding.  Neurological: She is alert.  Skin: She is not diaphoretic.  Decreased skin turgor noted, fingernails painted unable to assess capillary refill    Social History   Socioeconomic History  . Marital status: Married    Spouse name: Not on file  . Number of children: Not on file  . Years of education: Not on file  . Highest education level: Not on file  Occupational History  . Not on file  Social Needs  . Financial resource strain: Not on file  . Food insecurity:    Worry: Not on file    Inability: Not on file  . Transportation needs:    Medical: Not on file    Non-medical: Not on file  Tobacco Use  . Smoking status: Former Smoker    Packs/day: 0.25    Years: 30.00    Pack years: 7.50    Last attempt to quit: 05/11/2010    Years since quitting: 7.6  . Smokeless tobacco: Never Used  Substance and Sexual Activity  . Alcohol use: Yes    Alcohol/week: 0.0 oz    Comment: Occasionally  . Drug use: No  . Sexual activity: Not on file  Lifestyle  . Physical activity:    Days per week: Not on file    Minutes per session:  Not on file  . Stress: Not on file  Relationships  . Social connections:    Talks on phone: Not on file    Gets together: Not on file    Attends religious service: Not on file    Active member of club or organization: Not on file    Attends meetings of clubs or organizations: Not on file    Relationship status: Not on file  . Intimate partner violence:    Fear of current or ex partner: Not on file    Emotionally abused: Not on file    Physically abused: Not on file    Forced sexual activity: Not on file  Other Topics Concern  . Not on file  Social History Narrative   Married with 4 children. Retired, previously worked as a Scientist, clinical (histocompatibility and immunogenetics).    Family History    Problem Relation Age of Onset  . Cancer Mother   . Heart disease Mother   . Breast cancer Mother   . Stroke Father   . Cancer Sister   . Heart disease Sister   . Asthma Daughter   . Cancer Maternal Aunt   . Cancer Other   . Diabetes Other   . Hypertension Other     Assessment & Plan:   See Encounters Tab for problem based charting.  Patient discussed with Dr. Angelia Mould

## 2017-12-22 NOTE — Patient Instructions (Signed)
[name], I am sorry that you are feeling so poorly.  We have refilled the medications to help address your sinus drainage.  We have refilled your metoprolol that will help with your heart rate and blood pressure.  I have given you a short prescription for Imodium to help with your diarrhea.  I have refilled your Advair and albuterol inhaler for your COPD.  We gave you some fluid while you are here in the clinic.  If you begin to develop fever continue to not be able to eat, your diarrhea continues to persist and you are not able to take in adequate amounts of fluid please return.

## 2017-12-22 NOTE — Assessment & Plan Note (Addendum)
2/2 diarrheal illness, no longer on thiazide diuretic. Gave pt potassium supplementation in clinic 40 meq and another 67meq to take home.  Asked that pt stay for IV magnesium replacement in clinic as pt having diarrhea, pt refused due to time constraint.  Opted not to give oral as pt having severe diarrhea.  Went over magnesium rich foods pt could eat instead before she left.  Also wrote prescription for immodium.  If diarrhea persists pt instructed to return and we can reassess then otherwise this should correct.

## 2017-12-22 NOTE — Assessment & Plan Note (Signed)
Pt not feeling short of breath today, has not been using advair due to cost.  Prefers to use albuterol inhaler vs nebulizer machine due to noise it makes.  She asks for another inhaler today.    -refilled advair as pt reports her new insurance plan will cover it -refilled albuterol inhaler

## 2017-12-22 NOTE — Assessment & Plan Note (Signed)
Pt with symptoms consistent with worsening of her allergic rhinitis.  She had intense sinus drainage that was enough to cause her to vomit frothy mucous ridden material that I witnessed.  She states she can feel it draining down the back of her throat and makes her gag but denies any nausea or upset stomach.    -encouraged her to use her flonase she does not like using it due to discomfort of a foreign object up her nose -refilled zyrtec 5mg  -mucinex DM prescribed for cough and congestion

## 2017-12-22 NOTE — Progress Notes (Signed)
Attempted to start IV; unsuccessful x2 . Another nurse to try.

## 2017-12-22 NOTE — Assessment & Plan Note (Signed)
Pt appears to have caught some viral diarrheal illness being spread from person to person in her house which caused her to have diarrhea about 4-5 days ago.  There was no associated abdominal pain and none appreciated on exam.  She self treated with kaopectate and then became constipated, used an enema and has now had diarrhea again.  She reports 4-5 episodes yesterday. She has had no fevers or chills.  She reports good fluid intake but likely "not enough" given her diarrhea and continues to have poor appetite which is a chronic problem worsened by her acute illness.  She reports no nausea or upset stomach.  She denies any blood in stools or abnormal color, there is no pain with defecation.  -will give pt IV fluids here in clinic due to dry appearance on exam and fluid losses -replacing potassium -attempted to replace magnesium but patient would not stay for infusion -prescribed immodium PRN for diarrhea

## 2017-12-24 NOTE — Progress Notes (Signed)
Internal Medicine Clinic Attending  Case discussed with Dr. Winfrey  at the time of the visit.  We reviewed the resident's history and exam and pertinent patient test results.  I agree with the assessment, diagnosis, and plan of care documented in the resident's note.  

## 2017-12-27 ENCOUNTER — Other Ambulatory Visit: Payer: Self-pay | Admitting: Internal Medicine

## 2018-01-12 ENCOUNTER — Other Ambulatory Visit: Payer: Self-pay | Admitting: Internal Medicine

## 2018-01-13 NOTE — Telephone Encounter (Signed)
Yes this will be fine, I would like her to come in for a recheck

## 2018-01-23 ENCOUNTER — Other Ambulatory Visit: Payer: Self-pay | Admitting: Internal Medicine

## 2018-01-25 NOTE — Telephone Encounter (Addendum)
Received refill request from [facility] for pravastatin.  Last rx sent to [facility] (mail-order) pm 12/15/17 #90 with 3 refills.  Attempted to contact patient to confirm pharmacy-no answer.  HIPPA compliant message left on recorder.[place], Darlene Cassady6/17/201911:05 AM  Pt also needs appt to recheck potassium .Despina Hidden Cassady6/17/201911:06 AM      Spoke with patient-she never used [facility], [facility]. She was also scheduled to see she her pcp on Mon 6/24 as the clinic will be closed on Fri 6/21 for a routine follow up and potassium check.Despina Hidden Cassady6/20/20192:14 PM

## 2018-01-27 ENCOUNTER — Other Ambulatory Visit: Payer: Self-pay | Admitting: Internal Medicine

## 2018-01-27 MED ORDER — PANTOPRAZOLE SODIUM 40 MG PO TBEC: 40.0000 mg | DELAYED_RELEASE_TABLET | Freq: Every day | ORAL | 1 refills | Status: DC

## 2018-01-27 NOTE — Telephone Encounter (Signed)
Pharmacy calling requesting refill on protonix 40mg 

## 2018-01-27 NOTE — Telephone Encounter (Signed)
refilled 

## 2018-01-28 NOTE — Telephone Encounter (Signed)
Refilled will discuss at next visit

## 2018-02-01 ENCOUNTER — Encounter: Payer: Medicare HMO | Admitting: Internal Medicine

## 2018-02-01 ENCOUNTER — Encounter: Payer: Self-pay | Admitting: Internal Medicine

## 2018-02-10 ENCOUNTER — Encounter (HOSPITAL_COMMUNITY): Payer: Self-pay | Admitting: Obstetrics and Gynecology

## 2018-02-10 ENCOUNTER — Emergency Department (HOSPITAL_COMMUNITY)
Admission: EM | Admit: 2018-02-10 | Discharge: 2018-02-10 | Disposition: A | Discharge status: Home / Self Care | Payer: Medicare HMO | Attending: Emergency Medicine | Admitting: Emergency Medicine

## 2018-02-10 ENCOUNTER — Other Ambulatory Visit: Payer: Self-pay

## 2018-02-10 DIAGNOSIS — E876 Hypokalemia: Secondary | ICD-10-CM | POA: Insufficient documentation

## 2018-02-10 DIAGNOSIS — Z79899 Other long term (current) drug therapy: Secondary | ICD-10-CM | POA: Diagnosis not present

## 2018-02-10 DIAGNOSIS — I951 Orthostatic hypotension: Secondary | ICD-10-CM | POA: Insufficient documentation

## 2018-02-10 DIAGNOSIS — J449 Chronic obstructive pulmonary disease, unspecified: Secondary | ICD-10-CM | POA: Diagnosis not present

## 2018-02-10 DIAGNOSIS — I10 Essential (primary) hypertension: Secondary | ICD-10-CM | POA: Diagnosis not present

## 2018-02-10 DIAGNOSIS — R55 Syncope and collapse: Secondary | ICD-10-CM | POA: Diagnosis not present

## 2018-02-10 DIAGNOSIS — Z87891 Personal history of nicotine dependence: Secondary | ICD-10-CM | POA: Diagnosis not present

## 2018-02-10 LAB — CBC WITH DIFFERENTIAL/PLATELET
BASOS ABS: 0 10*3/uL (ref 0.0–0.1)
BASOS PCT: 0 %
EOS ABS: 0 10*3/uL (ref 0.0–0.7)
EOS PCT: 1 %
HCT: 36.2 % (ref 36.0–46.0)
Hemoglobin: 12.6 g/dL (ref 12.0–15.0)
Lymphocytes Relative: 26 %
Lymphs Abs: 1.4 10*3/uL (ref 0.7–4.0)
MCH: 29.1 pg (ref 26.0–34.0)
MCHC: 34.8 g/dL (ref 30.0–36.0)
MCV: 83.6 fL (ref 78.0–100.0)
Monocytes Absolute: 0.5 10*3/uL (ref 0.1–1.0)
Monocytes Relative: 10 %
Neutro Abs: 3.5 10*3/uL (ref 1.7–7.7)
Neutrophils Relative %: 63 %
Platelets: 357 10*3/uL (ref 150–400)
RBC: 4.33 MIL/uL (ref 3.87–5.11)
RDW: 14.9 % (ref 11.5–15.5)
WBC: 5.5 10*3/uL (ref 4.0–10.5)

## 2018-02-10 LAB — BASIC METABOLIC PANEL
ANION GAP: 13 (ref 5–15)
BUN: 6 mg/dL — ABNORMAL LOW (ref 8–23)
CALCIUM: 9 mg/dL (ref 8.9–10.3)
CHLORIDE: 91 mmol/L — AB (ref 98–111)
CO2: 25 mmol/L (ref 22–32)
Creatinine, Ser: 1.07 mg/dL — ABNORMAL HIGH (ref 0.44–1.00)
GFR calc non Af Amer: 51 mL/min — ABNORMAL LOW (ref >60)
GFR, EST AFRICAN AMERICAN: 59 mL/min — AB (ref >60)
Glucose, Bld: 99 mg/dL (ref 70–99)
POTASSIUM: 3.2 mmol/L — AB (ref 3.5–5.1)
Sodium: 129 mmol/L — ABNORMAL LOW (ref 135–145)

## 2018-02-10 MED ORDER — POTASSIUM CHLORIDE CRYS ER 20 MEQ PO TBCR
40.0000 meq | EXTENDED_RELEASE_TABLET | Freq: Once | ORAL | Status: AC
  Administered 2018-02-10: 40 meq via ORAL
  Filled 2018-02-10: qty 2

## 2018-02-10 MED ORDER — MAGNESIUM OXIDE 400 (241.3 MG) MG PO TABS
800.0000 mg | ORAL_TABLET | Freq: Once | ORAL | Status: AC
  Administered 2018-02-10: 800 mg via ORAL
  Filled 2018-02-10: qty 2

## 2018-02-10 MED ORDER — SODIUM CHLORIDE 0.9 % IV BOLUS
1000.0000 mL | Freq: Once | INTRAVENOUS | Status: AC
Start: 2018-02-10 — End: 2018-02-10
  Administered 2018-02-10: 1000 mL via INTRAVENOUS

## 2018-02-10 NOTE — ED Notes (Signed)
RN attempted IV x1 without success. Pt was reportedly stuck x3 with EMS.

## 2018-02-10 NOTE — ED Notes (Signed)
Pt covered with a blanket and sat out in the lobby in a wheelchair. Registration reports she will keep an eye on pt.

## 2018-02-10 NOTE — ED Notes (Signed)
Pt was ambulated in the hall without difficulty. Pt was steady and stated "I feel better"

## 2018-02-10 NOTE — ED Notes (Signed)
Pt on cardiac monitor.

## 2018-02-10 NOTE — ED Notes (Signed)
RN has attempted to call family numerous times. It says the voice mailbox is full. RN called the pt's son who said pt's daughter was on her way to get pt.  Pt asked if she felt okay to wait in the lobby and pt replied "yes"

## 2018-02-10 NOTE — ED Notes (Signed)
Bed: CM03 Expected date:  Expected time:  Means of arrival:  Comments: EMS Near syncope

## 2018-02-10 NOTE — ED Triage Notes (Signed)
Per EMS: Pt was found at home doing a breathing treatment.  Pt was 128/60 and then sat up and her BP dropped to 87/50.  Pt did not hit her head or fall but she did lose control of her bladder.  No shaking on scene. Pt denies pain.

## 2018-02-10 NOTE — ED Provider Notes (Signed)
[facility] DEPT Provider Note   CSN: [id number] Arrival date & time: 02/10/18  1749     History   Chief Complaint Chief Complaint  Patient presents with  . Hypotension  . Loss of Consciousness    HPI [name] is a 72 y.o. female.  72 yo F with a cc of syncope.  The patient took her blood pressure medicine at a different time than normal and she stood up and then passed out.  Family witnessed this and saw her eyes rolled back in her head and felt that she had some twitching in her jaw.  She is mainly back to baseline but then tried to stand up for EMS and had a repeat syncopal event.  Family denied any injury.  She is feeling better at this time.  Denies chest pain shortness of breath headache or neck pain.  She has been eating and drinking less and has had diarrhea which is a chronic issue for her.  The history is provided by the patient.  Loss of Consciousness   This is a new problem. The current episode started yesterday. The problem occurs constantly. The problem has not changed since onset.There was no loss of consciousness. Associated symptoms include light-headedness. Pertinent negatives include chest pain, congestion, dizziness, fever, headaches, nausea, palpitations and vomiting. She has tried nothing for the symptoms.    Past Medical History:  Diagnosis Date  . Cataract of both eyes 01/18/2009   Right surgically repaired  . 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  . Depression 06/08/2007  . Essential hypertension, benign 06/08/2007  . GERD 01/18/2009  . Osteoporosis 12/31/2012   Left femur had T score -2.5 on DEXA scan 02/26/2009.   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 Sarcoidosis 02/05/2009   PFTs 03/30/2009: FVC 67%, FEV1 48%, FEV1/FVC 52%, TLC 104%, DLCO 71%.    Patient Active Problem List   Diagnosis Date Noted  . Diarrhea 12/22/2017  .  Falls 12/26/2015  . Mixed incontinence urge and stress 12/26/2015  . Hypokalemia 10/03/2015  . Health care maintenance 09/12/2014  . Dyslipidemia 12/31/2012  . Allergic rhinitis 12/31/2012  . Osteoarthritis, hip, bilateral 11/01/2010  . Anorexia 03/19/2009  . 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 Sarcoidosis 02/05/2009  . GERD 01/18/2009  . Essential hypertension, benign 06/08/2007    Past Surgical History:  Procedure Laterality Date  . CATARACT EXTRACTION Right   . HIP ARTHROPLASTY Right   . TUBAL LIGATION       OB History   None      Home Medications    Prior to Admission medications   Medication Sig Start Date End Date Taking? Authorizing Provider  acetaminophen (TYLENOL) 325 MG tablet Take 650 mg by mouth as needed.   Yes [provider]  albuterol (PROVENTIL HFA;VENTOLIN HFA) 108 (90 Base) MCG/ACT inhaler Inhale 1-2 puffs into the lungs every 6 (six) hours as needed for wheezing or shortness of breath. 12/22/17 12/22/18 Yes Katherine Roan, MD  albuterol (PROVENTIL) (2.5 MG/3ML) 0.083% nebulizer solution Take 3 mLs (2.5 mg total) by nebulization every 6 (six) hours as needed. As needed for SOB 12/22/17  Yes Katherine Roan, MD  amLODipine (NORVASC) 2.5 MG tablet Take 1 tablet by mouth daily 11/03/17  Yes Winfrey, Jenne Pane, MD  calcium-vitamin D (OS-CAL 500 + D) 500-200 MG-UNIT per tablet Take 1 tablet by mouth  2 (two) times daily. 01/15/13  Yes Dorothy Spark, MD  [facility] LOPERAMIDE HCL 1 MG/7.5ML LIQD TAKE 15 ML BY MOUTH 4 TIMES DAILY AS NEEDED FOR UP TO 7 DAYS FOR DIARRHEA OR LOOSE STOOLS. 12/22/17  Yes [provider]  KLOR-CON M10 10 MEQ tablet TAKE 2 TABLETS BY MOUTH EVERY DAY 01/13/18  Yes Katherine Roan, MD  metoprolol succinate (TOPROL-XL) 50 MG 24 hr tablet Take with or immediately following a meal. 12/22/17  Yes Winfrey, Jenne Pane, MD  mirtazapine (REMERON) 15 MG tablet Take 1 tablet (15 mg total) by mouth at bedtime. 08/14/17  Yes Katherine Roan, MD  pantoprazole (PROTONIX) 40 MG tablet Take 1 tablet (40 mg total) by mouth daily. 01/27/18  Yes Katherine Roan, MD  pravastatin (PRAVACHOL) 40 MG tablet TAKE 1 TABLET BY MOUTH EVERY DAY IN THE EVENING 01/28/18  Yes Winfrey, Jenne Pane, MD  cetirizine (ZYRTEC) 5 MG tablet Take 1 tablet (5 mg total) by mouth daily. Patient not taking: Reported on 02/10/2018 12/22/17   Katherine Roan, MD  Dextromethorphan-Guaifenesin 60-1200 MG 12hr tablet Take 1 tablet by mouth every 12 (twelve) hours. Patient not taking: Reported on 02/10/2018 12/22/17   Katherine Roan, MD  fluticasone [facility]) 50 MCG/ACT nasal spray Place 1 spray into both nostrils daily. Patient not taking: Reported on 02/10/2018 12/22/17 12/22/18  Katherine Roan, MD  Fluticasone-Salmeterol (ADVAIR DISKUS) 500-50 MCG/DOSE AEPB Inhale 1 puff into the lungs every 12 (twelve) hours. Patient not taking: Reported on 02/10/2018 12/22/17   Katherine Roan, MD  meloxicam (MOBIC) 7.5 MG tablet Take 1 tablet (7.5 mg total) by mouth daily. Patient not taking: Reported on 02/10/2018 07/30/17 07/30/18  Thomasene Ripple, MD  sodium chloride (OCEAN) 0.65 % SOLN nasal spray Place 1 spray into both nostrils as needed for congestion. 03/21/16   Burgess Estelle, MD    Family History Family History  Problem Relation Age of Onset  . Cancer Mother   . Heart disease Mother   . Breast cancer Mother   . Stroke Father   . Cancer Sister   . Heart disease Sister   . Asthma Daughter   . Cancer Maternal Aunt   . Cancer Other   . Diabetes Other   . Hypertension Other     Social History Social History   Tobacco Use  . Smoking status: Former Smoker    Packs/day: 0.25    Years: 30.00    Pack years: 7.50    Last attempt to quit: 05/11/2010    Years since quitting: 7.7  . Smokeless tobacco: Never Used  Substance Use Topics  . Alcohol use: Yes    Alcohol/week: 0.0 oz    Comment: Occasionally  . Drug use: No     Allergies   Ace  inhibitors; Lisinopril; Valsartan; Aspirin; and Penicillins   Review of Systems Review of Systems  Constitutional: Negative for chills and fever.  HENT: Negative for congestion and rhinorrhea.   Eyes: Negative for redness and visual disturbance.  Respiratory: Negative for shortness of breath and wheezing.   Cardiovascular: Positive for syncope. Negative for chest pain and palpitations.  Gastrointestinal: Positive for diarrhea. Negative for nausea and vomiting.  Genitourinary: Negative for dysuria and urgency.  Musculoskeletal: Negative for arthralgias and myalgias.  Skin: Negative for pallor and wound.  Neurological: Positive for syncope and light-headedness. Negative for dizziness and headaches.     Physical Exam Updated Vital Signs BP 130/70 (BP Location: Left Arm)   Pulse  75   Temp 97.9 F (36.6 C) (Oral)   Resp 13   SpO2 99%   Physical Exam  Constitutional: She is oriented to person, place, and time. She appears well-developed and well-nourished. No distress.  HENT:  Head: Normocephalic and atraumatic.  Eyes: Pupils are equal, round, and reactive to light. EOM are normal.  Neck: Normal range of motion. Neck supple.  Cardiovascular: Normal rate and regular rhythm. Exam reveals no gallop and no friction rub.  No murmur heard. Pulmonary/Chest: Effort normal. She has no wheezes. She has no rales.  Abdominal: Soft. She exhibits no distension. There is no tenderness.  Musculoskeletal: She exhibits no edema or tenderness.  Neurological: She is alert and oriented to person, place, and time.  Skin: Skin is warm and dry. She is not diaphoretic.  Psychiatric: She has a normal mood and affect. Her behavior is normal.  Nursing note and vitals reviewed.    ED Treatments / Results  Labs (all labs ordered are listed, but only abnormal results are displayed) Labs Reviewed  BASIC METABOLIC PANEL - Abnormal; Notable for the following components:      Result Value   Sodium 129 (*)     Potassium 3.2 (*)    Chloride 91 (*)    BUN 6 (*)    Creatinine, Ser 1.07 (*)    GFR calc non Af Amer 51 (*)    GFR calc Af Amer 59 (*)    All other components within normal limits  CBC WITH DIFFERENTIAL/PLATELET    EKG None  Radiology No results found.  Procedures Procedures (including critical care time)  Medications Ordered in ED Medications  sodium chloride 0.9 % bolus 1,000 mL (1,000 mLs Intravenous New Bag/Given 02/10/18 1840)  potassium chloride SA (K-DUR,KLOR-CON) CR tablet 40 mEq (has no administration in time range)  magnesium oxide (MAG-OX) tablet 800 mg (has no administration in time range)     Initial Impression / Assessment and Plan / ED Course  I have reviewed the triage vital signs and the nursing notes.  Pertinent labs & imaging results that were available during my care of the patient were reviewed by me and considered in my medical decision making (see chart for details).     72 yo F with a cc of a syncopal event.  The patient had to these in close succession.  She said she took her blood pressure medicine which normally makes her lightheaded and so she typically lays down for about an hour but was running late and had some things to do.  She also has not had much to eat and drink for the past couple days.  I will give a bag of fluids check labs and attempt to ambulate the patient.  Sounds better post fluids.  Labs with mild hypokalemia hyponatremia and hypochloremia.  Creatinine is very mildly elevated from her baseline.  Will replenish potassium here and have her eat a diet rich in potassium for the next couple days.  Have her eat and drink well as well.  Follow-up with her PCP in a week for lab recheck and reassessment.  7:28 PM:  I have discussed the diagnosis/risks/treatment options with the patient and believe the pt to be eligible for discharge home to follow-up with PCP. We also discussed returning to the ED immediately if new or worsening sx occur.  We discussed the sx which are most concerning (e.g., sudden worsening pain, fever, inability to tolerate by mouth) that necessitate immediate return. Medications administered to  the patient during their visit and any new prescriptions provided to the patient are listed below.  Medications given during this visit Medications  sodium chloride 0.9 % bolus 1,000 mL (1,000 mLs Intravenous New Bag/Given 02/10/18 1840)  potassium chloride SA (K-DUR,KLOR-CON) CR tablet 40 mEq (has no administration in time range)  magnesium oxide (MAG-OX) tablet 800 mg (has no administration in time range)      The patient appears reasonably screen and/or stabilized for discharge and I doubt any other medical condition or other [facility] requiring further screening, evaluation, or treatment in the ED at this time prior to discharge.    Final Clinical Impressions(s) / ED Diagnoses   Final diagnoses:  Syncope and collapse  Orthostatic hypertension  Hypokalemia    ED Discharge Orders    None       Deno Etienne, DO 02/10/18 1928

## 2018-03-01 ENCOUNTER — Encounter: Payer: Self-pay | Admitting: Internal Medicine

## 2018-03-01 ENCOUNTER — Other Ambulatory Visit: Payer: Self-pay

## 2018-03-01 ENCOUNTER — Ambulatory Visit (INDEPENDENT_AMBULATORY_CARE_PROVIDER_SITE_OTHER): Payer: Medicare HMO | Admitting: Internal Medicine

## 2018-03-01 VITALS — BP 93/72 | HR 90 | Temp 98.5°F | Ht 65.0 in | Wt 108.1 lb

## 2018-03-01 DIAGNOSIS — I10 Essential (primary) hypertension: Secondary | ICD-10-CM | POA: Diagnosis not present

## 2018-03-01 DIAGNOSIS — Z1239 Encounter for other screening for malignant neoplasm of breast: Secondary | ICD-10-CM

## 2018-03-01 DIAGNOSIS — K219 Gastro-esophageal reflux disease without esophagitis: Secondary | ICD-10-CM

## 2018-03-01 DIAGNOSIS — R630 Anorexia: Secondary | ICD-10-CM | POA: Diagnosis not present

## 2018-03-01 DIAGNOSIS — K589 Irritable bowel syndrome without diarrhea: Secondary | ICD-10-CM | POA: Insufficient documentation

## 2018-03-01 DIAGNOSIS — Z1211 Encounter for screening for malignant neoplasm of colon: Secondary | ICD-10-CM

## 2018-03-01 DIAGNOSIS — K5900 Constipation, unspecified: Secondary | ICD-10-CM | POA: Diagnosis not present

## 2018-03-01 DIAGNOSIS — Z122 Encounter for screening for malignant neoplasm of respiratory organs: Secondary | ICD-10-CM

## 2018-03-01 DIAGNOSIS — Z681 Body mass index (BMI) 19 or less, adult: Secondary | ICD-10-CM | POA: Diagnosis not present

## 2018-03-01 DIAGNOSIS — Z79899 Other long term (current) drug therapy: Secondary | ICD-10-CM | POA: Diagnosis not present

## 2018-03-01 DIAGNOSIS — R634 Abnormal weight loss: Secondary | ICD-10-CM

## 2018-03-01 DIAGNOSIS — Z87891 Personal history of nicotine dependence: Secondary | ICD-10-CM | POA: Diagnosis not present

## 2018-03-01 MED ORDER — MIRTAZAPINE 15 MG PO TABS: 15.0000 mg | ORAL_TABLET | Freq: Every day | ORAL | 1 refills | Status: DC

## 2018-03-01 MED ORDER — POLYETHYLENE GLYCOL 3350 17 G PO PACK: 17.0000 g | PACK | Freq: Every day | ORAL | 3 refills | Status: DC

## 2018-03-01 NOTE — Progress Notes (Signed)
CC: follow up for HTN, also discussed constipation, unintentional weight loss, breast cancer and colon cancer screenings, lung cancer screening  HPI:  [name] is a 72 y.o. female with PMH below.  She is here to follow up for HTN, also discussed constipation, unintentional weight loss, breast cancer and colon cancer screenings, lung cancer screening  Please see A&P for status of the patient's chronic medical conditions  Past Medical History:  Diagnosis Date  . Cataract of both eyes 01/18/2009   Right surgically repaired  . 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  . Depression 06/08/2007  . Essential hypertension, benign 06/08/2007  . GERD 01/18/2009  . Osteoporosis 12/31/2012   Left femur had T score -2.5 on DEXA scan 02/26/2009.   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 Sarcoidosis 02/05/2009   PFTs 03/30/2009: FVC 67%, FEV1 48%, FEV1/FVC 52%, TLC 104%, DLCO 71%.   Review of Systems:  ROS: Pulmonary: pt denies increased work of breathing, shortness of breath,  Cardiac: pt denies palpitations, chest pain,  Abdominal: pt denies abdominal pain, nausea, vomiting, or diarrhea  Physical Exam:  Vitals:   03/01/18 1505  BP: 93/72  Pulse: 90  Temp: 98.5 F (36.9 C)  TempSrc: Oral  SpO2: 96%  Weight: 108 lb 1.6 oz (49 kg)  Height: 5\' 5"  (1.651 m)   Physical Exam  Constitutional: No distress.  Cardiovascular: Normal rate, regular rhythm and normal heart sounds. Exam reveals no gallop and no friction rub.  No murmur heard. Pulmonary/Chest: Effort normal and breath sounds normal. No respiratory distress. She has no wheezes. She has no rales. She exhibits no tenderness.  Abdominal: Soft. Bowel sounds are normal. She exhibits no distension and no mass. There is no tenderness. There is no rebound and no guarding.  Neurological: She is alert.  Skin: She is not diaphoretic.    Social  History   Socioeconomic History  . Marital status: Married    Spouse name: Not on file  . Number of children: Not on file  . Years of education: Not on file  . Highest education level: Not on file  Occupational History  . Not on file  Social Needs  . Financial resource strain: Not on file  . Food insecurity:    Worry: Not on file    Inability: Not on file  . Transportation needs:    Medical: Not on file    Non-medical: Not on file  Tobacco Use  . Smoking status: Former Smoker    Packs/day: 0.25    Years: 30.00    Pack years: 7.50    Last attempt to quit: 05/11/2010    Years since quitting: 7.8  . Smokeless tobacco: Never Used  Substance and Sexual Activity  . Alcohol use: Yes    Alcohol/week: 0.0 oz    Comment: wine cooler daily.  . Drug use: No  . Sexual activity: Not on file  Lifestyle  . Physical activity:    Days per week: Not on file    Minutes per session: Not on file  . Stress: Not on file  Relationships  . Social connections:    Talks on phone: Not on file    Gets together: Not on file    Attends religious service: Not on file    Active member of club or organization: Not on file    Attends meetings of clubs or organizations:  Not on file    Relationship status: Not on file  . Intimate partner violence:    Fear of current or ex partner: Not on file    Emotionally abused: Not on file    Physically abused: Not on file    Forced sexual activity: Not on file  Other Topics Concern  . Not on file  Social History Narrative   Married with 4 children. Retired, previously worked as a Scientist, clinical (histocompatibility and immunogenetics).    Family History  Problem Relation Age of Onset  . Cancer Mother   . Heart disease Mother   . Breast cancer Mother   . Stroke Father   . Cancer Sister   . Heart disease Sister   . Asthma Daughter   . Cancer Maternal Aunt   . Cancer Other   . Diabetes Other   . Hypertension Other     Assessment & Plan:   See Encounters Tab for problem based  charting.  Patient discussed with Dr. Daryll Drown

## 2018-03-01 NOTE — Patient Instructions (Addendum)
[name], We will stop your amlodipine today and check some labs to workup why you are losing this weight.  We have also ordered several screening tests that screen for breast, colon and lung cancer.  I will call you with the results of your labs when they return.  Please check your blood pressure at home 2-3 x per week and bring back your log of these pressures

## 2018-03-02 LAB — CMP14 + ANION GAP
ALBUMIN: 3.2 g/dL — AB (ref 3.5–4.8)
ALK PHOS: 140 IU/L — AB (ref 39–117)
ALT: 17 IU/L (ref 0–32)
ANION GAP: 18 mmol/L (ref 10.0–18.0)
AST: 36 IU/L (ref 0–40)
Albumin/Globulin Ratio: 0.8 — ABNORMAL LOW (ref 1.2–2.2)
BILIRUBIN TOTAL: 0.4 mg/dL (ref 0.0–1.2)
BUN / CREAT RATIO: 11 — AB (ref 12–28)
BUN: 10 mg/dL (ref 8–27)
CHLORIDE: 95 mmol/L — AB (ref 96–106)
CO2: 25 mmol/L (ref 20–29)
CREATININE: 0.92 mg/dL (ref 0.57–1.00)
Calcium: 9.2 mg/dL (ref 8.7–10.3)
GFR calc Af Amer: 72 mL/min/{1.73_m2} (ref >59)
GFR calc non Af Amer: 63 mL/min/{1.73_m2} (ref >59)
Globulin, Total: 4 g/dL (ref 1.5–4.5)
Glucose: 82 mg/dL (ref 65–99)
Potassium: 3.9 mmol/L (ref 3.5–5.2)
SODIUM: 138 mmol/L (ref 134–144)
Total Protein: 7.2 g/dL (ref 6.0–8.5)

## 2018-03-02 LAB — TSH: TSH: 0.503 u[IU]/mL (ref 0.450–4.500)

## 2018-03-02 LAB — MAGNESIUM: MAGNESIUM: 1.6 mg/dL (ref 1.6–2.3)

## 2018-03-02 NOTE — Assessment & Plan Note (Addendum)
This is not the first time the patient has had problems with this.  She has had about a 10 pound weight loss over the last year.  She denies any abdominal pain, nausea, fever or night sweats.  She tells me that she is does not have any taste for food and she has no appetite.  No dysphagia her GERD is controlled on PPI.   -TSH, CMP, mag -will reorder her mammogram and colonoscopy.  The patient is not very motivated to get her cancer screenings, I have already ordered these and the patient canceled but today her daughter is with her who assures me her mother is ready to get the screenings -given her extensive smoking history she meets criteria for low dose CT scan for lung cancer screening.  We discussed the risks vs benefits and the patient agreed to get this screening as well -will restart remeron as pt had some success with this in the past unsure why it was stopped or how it dropped off the medlist

## 2018-03-02 NOTE — Assessment & Plan Note (Addendum)
Patient used to have difficult to control hypertension.  Over the past 6 months her blood pressure has been more easily controlled at her last visit we stopped her chlorthalidone 50 mg.  Recently she was seen in the emergency department for a syncopal episode thought to be due to orthostatic hypotension.  She responded well to IV fluids and was sent home.  This was likely a combination of poor oral intake, the heat, and her continued decrease in need for blood pressure medication partly due to continued weight loss and poor oral intake.    -will discontinue her amlodipine today -Opted to keep her metoprolol at 50 mg and see how her blood pressure is on the solo agent -We will check a CMP today, have patient come back in 1 month for blood pressure check

## 2018-03-02 NOTE — Assessment & Plan Note (Addendum)
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  She recently was treated for diarrhea.  She has a problem with going from one extreme to the other.  After treatment she is now constipated and admits to having problems with constipation in the past as well.  Had a hard BM 2 days prior.    -will add low dose miralax.  Instructed the patient to titrate for a response she may need less than the average person she can start with one half of the packet and aim for one consistent soft bowel movement per day

## 2018-03-03 NOTE — Progress Notes (Signed)
Internal Medicine Clinic Attending  Case discussed with Dr. Winfrey  at the time of the visit.  We reviewed the resident's history and exam and pertinent patient test results.  I agree with the assessment, diagnosis, and plan of care documented in the resident's note.  

## 2018-03-04 ENCOUNTER — Telehealth: Payer: Self-pay | Admitting: Internal Medicine

## 2018-03-04 NOTE — Telephone Encounter (Signed)
left message going over CMP, TSH and magnesium results.

## 2018-03-08 ENCOUNTER — Encounter: Payer: Self-pay | Admitting: *Deleted

## 2018-03-09 ENCOUNTER — Other Ambulatory Visit: Payer: Self-pay | Admitting: Internal Medicine

## 2018-03-09 DIAGNOSIS — Z1231 Encounter for screening mammogram for malignant neoplasm of breast: Secondary | ICD-10-CM

## 2018-03-12 ENCOUNTER — Other Ambulatory Visit: Payer: Self-pay | Admitting: Internal Medicine

## 2018-03-15 NOTE — Telephone Encounter (Signed)
refilled 

## 2018-03-29 ENCOUNTER — Other Ambulatory Visit: Payer: Self-pay | Admitting: Internal Medicine

## 2018-03-30 NOTE — Telephone Encounter (Signed)
Will refill for 30 more days, will d/c if k level stable at next visit.

## 2018-04-02 ENCOUNTER — Ambulatory Visit
Admission: RE | Admit: 2018-04-02 | Discharge: 2018-04-02 | Disposition: A | Discharge status: Home / Self Care | Payer: Medicare HMO | Source: Ambulatory Visit | Attending: Internal Medicine | Admitting: Internal Medicine

## 2018-04-02 DIAGNOSIS — Z1231 Encounter for screening mammogram for malignant neoplasm of breast: Secondary | ICD-10-CM | POA: Diagnosis not present

## 2018-04-10 ENCOUNTER — Other Ambulatory Visit: Payer: Self-pay | Admitting: Internal Medicine

## 2018-04-14 ENCOUNTER — Ambulatory Visit (HOSPITAL_COMMUNITY): Payer: Medicare HMO | Attending: Internal Medicine

## 2018-04-28 ENCOUNTER — Ambulatory Visit (AMBULATORY_SURGERY_CENTER): Payer: Medicare HMO | Admitting: *Deleted

## 2018-04-28 VITALS — Ht 65.0 in | Wt 106.0 lb

## 2018-04-28 DIAGNOSIS — Z1211 Encounter for screening for malignant neoplasm of colon: Secondary | ICD-10-CM

## 2018-04-28 NOTE — Progress Notes (Signed)
Patient denies any allergies to eggs or soy. Patient denies any problems with anesthesia/sedation. Patient denies any oxygen use at home. Patient denies taking any diet/weight loss medications or blood thinners. EMMI education offered, pt declined. Pt has Suprep.

## 2018-04-29 ENCOUNTER — Encounter: Payer: Self-pay | Admitting: Gastroenterology

## 2018-05-12 ENCOUNTER — Ambulatory Visit (AMBULATORY_SURGERY_CENTER): Payer: Medicare HMO | Admitting: Gastroenterology

## 2018-05-12 ENCOUNTER — Encounter: Payer: Self-pay | Admitting: Gastroenterology

## 2018-05-12 VITALS — BP 118/78 | HR 91 | Temp 96.2°F | Resp 15 | Ht 65.0 in | Wt 105.0 lb

## 2018-05-12 DIAGNOSIS — Z1211 Encounter for screening for malignant neoplasm of colon: Secondary | ICD-10-CM | POA: Diagnosis not present

## 2018-05-12 DIAGNOSIS — K635 Polyp of colon: Secondary | ICD-10-CM

## 2018-05-12 DIAGNOSIS — D125 Benign neoplasm of sigmoid colon: Secondary | ICD-10-CM

## 2018-05-12 MED ORDER — SODIUM CHLORIDE 0.9 % IV SOLN: 500.0000 mL | Freq: Once | INTRAVENOUS | Status: DC

## 2018-05-12 NOTE — Op Note (Signed)
[facility] Patient Name: [name] Procedure Date: 05/12/2018 9:03 AM MRN: [id number] Endoscopist: Ladene Artist , MD Age: 72 Referring MD:  Date of Birth: 07-Nov-1945 Gender: Female Account #: [id number] Procedure:                Colonoscopy Indications:              Screening for colorectal malignant neoplasm Medicines:                Monitored Anesthesia Care Procedure:                Pre-Anesthesia Assessment:                           - Prior to the procedure, a History and Physical                            was performed, and patient medications and                            allergies were reviewed. The patient's tolerance of                            previous anesthesia was also reviewed. The risks                            and benefits of the procedure and the sedation                            options and risks were discussed with the patient.                            All questions were answered, and informed consent                            was obtained. Prior Anticoagulants: The patient has                            taken no previous anticoagulant or antiplatelet                            agents. ASA Grade Assessment: II - A patient with                            mild systemic disease. After reviewing the risks                            and benefits, the patient was deemed in                            satisfactory condition to undergo the procedure.                           After obtaining informed consent, the colonoscope  was passed under direct vision. Throughout the                            procedure, the patient's blood pressure, pulse, and                            oxygen saturations were monitored continuously. The                            Colonoscope was introduced through the anus and                            advanced to the the cecum, identified by                            appendiceal orifice and  ileocecal valve. The                            ileocecal valve, appendiceal orifice, and rectum                            were photographed. The quality of the bowel                            preparation was good. The colonoscopy was performed                            without difficulty. The patient tolerated the                            procedure well. Scope In: 9:09:39 AM Scope Out: 9:19:24 AM Scope Withdrawal Time: 0 hours 7 minutes 15 seconds  Total Procedure Duration: 0 hours 9 minutes 45 seconds  Findings:                 The perianal and digital rectal examinations were                            normal.                           A 6 mm polyp was found in the sigmoid colon. The                            polyp was sessile. The polyp was removed with a                            cold snare. Resection and retrieval were complete.                           Multiple medium-mouthed diverticula were found in                            the entire colon. There was no evidence of  diverticular bleeding.                           Internal hemorrhoids were found during                            retroflexion. The hemorrhoids were small and Grade                            I (internal hemorrhoids that do not prolapse).                           The exam was otherwise without abnormality on                            direct and retroflexion views. Complications:            No immediate complications. Estimated blood loss:                            None. Estimated Blood Loss:     Estimated blood loss: none. Impression:               - One 6 mm polyp in the sigmoid colon, removed with                            a cold snare. Resected and retrieved.                           - Moderate diverticulosis in the entire examined                            colon.                           - Internal hemorrhoids.                           - The examination was  otherwise normal on direct                            and retroflexion views. Recommendation:           - Repeat colonoscopy in 5 years for surveillance if                            polyp is precancerous, otherwise no plans for                            future screening colonoscopy due to age.                           - Patient has a contact number available for                            emergencies. The signs and symptoms of potential  delayed complications were discussed with the                            patient. Return to normal activities tomorrow.                            Written discharge instructions were provided to the                            patient.                           - High fiber diet.                           - Continue present medications.                           - Await pathology results. Ladene Artist, MD 05/12/2018 9:23:40 AM This report has been signed electronically.

## 2018-05-12 NOTE — Progress Notes (Signed)
A and O x3. Report to RN. Tolerated MAC anesthesia well.

## 2018-05-12 NOTE — Patient Instructions (Signed)
YOU HAD AN ENDOSCOPIC PROCEDURE TODAY AT [facility]:   Refer to the procedure report that was given to you for any specific questions about what was found during the examination.  If the procedure report does not answer your questions, please call your gastroenterologist to clarify.  If you requested that your care partner not be given the details of your procedure findings, then the procedure report has been included in a sealed envelope for you to review at your convenience later.  YOU SHOULD EXPECT: Some feelings of bloating in the abdomen. Passage of more gas than usual.  Walking can help get rid of the air that was put into your GI tract during the procedure and reduce the bloating. If you had a lower endoscopy (such as a colonoscopy or flexible sigmoidoscopy) you may notice spotting of blood in your stool or on the toilet paper. If you underwent a bowel prep for your procedure, you may not have a normal bowel movement for a few days.  Please Note:  You might notice some irritation and congestion in your nose or some drainage.  This is from the oxygen used during your procedure.  There is no need for concern and it should clear up in a day or so.  SYMPTOMS TO REPORT IMMEDIATELY:   Following lower endoscopy (colonoscopy or flexible sigmoidoscopy):  Excessive amounts of blood in the stool  Significant tenderness or worsening of abdominal pains  Swelling of the abdomen that is new, acute  Fever of 100F or higher   For urgent or emergent issues, a gastroenterologist can be reached at any hour by calling [phone].   DIET:  We do recommend a small meal at first, but then you may proceed to your regular diet. Follow a High Fiber Diet. Drink plenty of fluids but you should avoid alcoholic beverages for 24 hours.  MEDICATIONS: Continue present medications.  Please see handouts given to you by your recovery nurse.  ACTIVITY:  You should plan to take it easy for the rest  of today and you should NOT DRIVE or use heavy machinery until tomorrow (because of the sedation medicines used during the test).    FOLLOW UP: Our staff will call the number listed on your records the next business day following your procedure to check on you and address any questions or concerns that you may have regarding the information given to you following your procedure. If we do not reach you, we will leave a message.  However, if you are feeling well and you are not experiencing any problems, there is no need to return our call.  We will assume that you have returned to your regular daily activities without incident.  If any biopsies were taken you will be contacted by phone or by letter within the next 1-3 weeks.  Please call us at [phone] if you have not heard about the biopsies in 3 weeks.   Thank you for allowing [place] to provide for your healthcare needs today.  SIGNATURES/CONFIDENTIALITY: You and/or your care partner have signed paperwork which will be entered into your electronic medical record.  These signatures attest to the fact that that the information above on your After Visit Summary has been reviewed and is understood.  Full responsibility of the confidentiality of this discharge information lies with you and/or your care-partner.

## 2018-05-12 NOTE — Progress Notes (Signed)
Called to room to assist during endoscopic procedure.  Patient ID and intended procedure confirmed with present staff. Received instructions for my participation in the procedure from the performing physician.  

## 2018-05-12 NOTE — Progress Notes (Signed)
No change to medical history.

## 2018-05-13 ENCOUNTER — Telehealth: Payer: Self-pay | Admitting: *Deleted

## 2018-05-13 NOTE — Telephone Encounter (Signed)
  Follow up Call-  Call back number 05/12/2018  Post procedure Call Back phone  # [phone]  Permission to leave phone message Yes  Some recent data might be hidden     Patient questions:  Do you have a fever, pain , or abdominal swelling? No. Pain Score  0 *  Have you tolerated food without any problems? Yes.    Have you been able to return to your normal activities? Yes.    Do you have any questions about your discharge instructions: Diet   No. Medications  No. Follow up visit  No.  Do you have questions or concerns about your Care? No.  Actions: * If pain score is 4 or above: No action needed, pain <4.

## 2018-05-24 ENCOUNTER — Other Ambulatory Visit: Payer: Self-pay

## 2018-05-24 ENCOUNTER — Encounter: Payer: Self-pay | Admitting: Internal Medicine

## 2018-05-24 ENCOUNTER — Ambulatory Visit (INDEPENDENT_AMBULATORY_CARE_PROVIDER_SITE_OTHER): Payer: Medicare HMO | Admitting: Internal Medicine

## 2018-05-24 VITALS — BP 150/69 | HR 92 | Temp 98.0°F | Ht 65.0 in | Wt 107.4 lb

## 2018-05-24 DIAGNOSIS — R634 Abnormal weight loss: Secondary | ICD-10-CM

## 2018-05-24 DIAGNOSIS — Z87891 Personal history of nicotine dependence: Secondary | ICD-10-CM | POA: Diagnosis not present

## 2018-05-24 DIAGNOSIS — Z681 Body mass index (BMI) 19 or less, adult: Secondary | ICD-10-CM

## 2018-05-24 DIAGNOSIS — M8580 Other specified disorders of bone density and structure, unspecified site: Secondary | ICD-10-CM

## 2018-05-24 DIAGNOSIS — I10 Essential (primary) hypertension: Secondary | ICD-10-CM | POA: Diagnosis not present

## 2018-05-24 DIAGNOSIS — Z23 Encounter for immunization: Secondary | ICD-10-CM | POA: Diagnosis not present

## 2018-05-24 DIAGNOSIS — Z79899 Other long term (current) drug therapy: Secondary | ICD-10-CM

## 2018-05-24 DIAGNOSIS — K582 Mixed irritable bowel syndrome: Secondary | ICD-10-CM | POA: Diagnosis not present

## 2018-05-24 MED ORDER — AMLODIPINE BESYLATE 5 MG PO TABS: 5.0000 mg | ORAL_TABLET | Freq: Every day | ORAL | 3 refills | Status: DC

## 2018-05-24 MED ORDER — AMLODIPINE BESYLATE 10 MG PO TABS: 10.0000 mg | ORAL_TABLET | Freq: Every day | ORAL | 3 refills | Status: DC

## 2018-05-24 MED ORDER — CALCIUM CARBONATE-VITAMIN D 500-200 MG-UNIT PO TABS: 1.0000 | ORAL_TABLET | Freq: Two times a day (BID) | ORAL | 11 refills | Status: DC

## 2018-05-24 MED ORDER — POLYETHYLENE GLYCOL 3350 17 G PO PACK: 17.0000 g | PACK | Freq: Every day | ORAL | 3 refills | Status: DC

## 2018-05-24 NOTE — Progress Notes (Signed)
CC: HTN, IBS C/D, unintentional weight loss, low bone mass  HPI:  [name] is a 72 y.o. female with PMH below.  Today we will address her HTN, IBS C/D, low bone mass.     Please see A&P for status of the patient's chronic medical conditions  Past Medical History:  Diagnosis Date  . Blind left eye   . Cataract of both eyes 01/18/2009   Right surgically repaired  . 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  . Depression 06/08/2007  . Essential hypertension, benign 06/08/2007  . GERD 01/18/2009  . Osteoporosis 12/31/2012   Left femur had T score -2.5 on DEXA scan 02/26/2009.   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 Sarcoidosis 02/05/2009   PFTs 03/30/2009: FVC 67%, FEV1 48%, FEV1/FVC 52%, TLC 104%, DLCO 71%.   Review of Systems:  ROS: Pulmonary: pt denies increased work of breathing, shortness of breath,  Cardiac: pt denies palpitations, chest pain,  Abdominal: pt denies abdominal pain, nausea, vomiting, or diarrhea  Physical Exam:  Vitals:   05/24/18 1431  BP: (!) 150/69  Pulse: 92  Temp: 98 F (36.7 C)  TempSrc: Oral  SpO2: 98%  Weight: 107 lb 6.4 oz (48.7 kg)  Height: 5\' 5"  (1.651 m)   Cardiac: normal rate and rhythm, clear s1 and s2 Pulmonary: CTAB, not in distress Abdominal: non distended abdomen, soft and nontender Extremities: no LE edema Psych: Alert, conversant, in good spirits   Social History   Socioeconomic History  . Marital status: Married    Spouse name: Not on file  . Number of children: Not on file  . Years of education: Not on file  . Highest education level: Not on file  Occupational History  . Not on file  Social Needs  . Financial resource strain: Not on file  . Food insecurity:    Worry: Not on file    Inability: Not on file  . Transportation needs:    Medical: Not on file    Non-medical: Not on file  Tobacco Use  . Smoking status:  Former Smoker    Packs/day: 0.25    Years: 30.00    Pack years: 7.50    Last attempt to quit: 05/11/2010    Years since quitting: 8.0  . Smokeless tobacco: Never Used  Substance and Sexual Activity  . Alcohol use: Yes    Alcohol/week: 0.0 standard drinks    Comment: wine cooler.  . Drug use: No  . Sexual activity: Not on file  Lifestyle  . Physical activity:    Days per week: Not on file    Minutes per session: Not on file  . Stress: Not on file  Relationships  . Social connections:    Talks on phone: Not on file    Gets together: Not on file    Attends religious service: Not on file    Active member of club or organization: Not on file    Attends meetings of clubs or organizations: Not on file    Relationship status: Not on file  . Intimate partner violence:    Fear of current or ex partner: Not on file    Emotionally abused: Not on file    Physically abused: Not on file    Forced sexual activity: Not on file  Other Topics Concern  . Not on file  Social History Narrative  Married with 4 children. Retired, previously worked as a Scientist, clinical (histocompatibility and immunogenetics).    Family History  Problem Relation Age of Onset  . Cancer Mother   . Heart disease Mother   . Breast cancer Mother   . Stroke Father   . Cancer Sister   . Heart disease Sister   . Asthma Daughter   . Cancer Maternal Aunt   . Cancer Other   . Diabetes Other   . Hypertension Other   . Colon cancer Neg Hx     Assessment & Plan:   See Encounters Tab for problem based charting.  Patient discussed with Dr. Rebeca Alert

## 2018-05-24 NOTE — Patient Instructions (Addendum)
[name], we will start back your amlodipine 5mg  daily in addition to your metoprolol.  We will check some labs today to keep an eye on your bone health.  I have sent some more miralax for your constipation.  I will check your thyroid function to see if there is any abnormality that may be effecting your weight loss.  Please come back in about 2 months so we can recheck your blood pressure.

## 2018-05-24 NOTE — Assessment & Plan Note (Signed)
Patient used to have difficult to control hypertension.  She is taking her metoprolol 50mg  and we will need to add back her amlodipine 5mg .    -will add back amlodipine at 5mg 

## 2018-05-24 NOTE — Assessment & Plan Note (Signed)
Alk phos elevated on last visit.  History of low bone mass on most recent dexa in 2016, has not been taking calcium and vitamin D supplements.    -restart oscal -will check PTH

## 2018-05-24 NOTE — Assessment & Plan Note (Signed)
She has not noticed much improvement in appetite since the mirtazapine but she has made a conscious effort to eat more and is drinking boost, feels like she has gained some weight, looks like 2lbs in last two weeks based on our chart. Her cancer screenings we ordered at the last visit mammo and colonoscopy came back normal.  TSH was low normal.    -Will continue workup will add free T4 to TSH as TSH was low normal. -Does seem to be improving, encouraged continued use of boost shakes and regular meals

## 2018-05-24 NOTE — Assessment & Plan Note (Addendum)
Patient with IBS C/D.   She has a problem with going from one extreme to the other.  She continues to have trouble with constipation. Last visit I prescribed her miralax with instructions to titrate to a response and she may need less than the average person gi ven her tendency to get diarrhea.  -never picked up miralax, still having constipation symptoms will reorder

## 2018-05-25 LAB — T4, FREE: Free T4: 1.08 ng/dL (ref 0.82–1.77)

## 2018-05-25 LAB — CMP14 + ANION GAP
ALT: 12 IU/L (ref 0–32)
AST: 36 IU/L (ref 0–40)
Albumin/Globulin Ratio: 0.8 — ABNORMAL LOW (ref 1.2–2.2)
Albumin: 3.4 g/dL — ABNORMAL LOW (ref 3.5–4.8)
Alkaline Phosphatase: 120 IU/L — ABNORMAL HIGH (ref 39–117)
Anion Gap: 18 mmol/L (ref 10.0–18.0)
BUN/Creatinine Ratio: 6 — ABNORMAL LOW (ref 12–28)
BUN: 5 mg/dL — ABNORMAL LOW (ref 8–27)
Bilirubin Total: 0.6 mg/dL (ref 0.0–1.2)
CO2: 26 mmol/L (ref 20–29)
Calcium: 8.6 mg/dL — ABNORMAL LOW (ref 8.7–10.3)
Chloride: 93 mmol/L — ABNORMAL LOW (ref 96–106)
Creatinine, Ser: 0.88 mg/dL (ref 0.57–1.00)
GFR calc Af Amer: 76 mL/min/{1.73_m2} (ref >59)
GFR calc non Af Amer: 66 mL/min/{1.73_m2} (ref >59)
Globulin, Total: 4.3 g/dL (ref 1.5–4.5)
Glucose: 86 mg/dL (ref 65–99)
Potassium: 3.5 mmol/L (ref 3.5–5.2)
Sodium: 137 mmol/L (ref 134–144)
Total Protein: 7.7 g/dL (ref 6.0–8.5)

## 2018-05-25 LAB — PTH, INTACT AND CALCIUM
Calcium: 8.6 mg/dL — ABNORMAL LOW (ref 8.7–10.3)
PTH: 39 pg/mL (ref 15–65)

## 2018-05-25 LAB — TSH: TSH: 0.855 u[IU]/mL (ref 0.450–4.500)

## 2018-05-31 ENCOUNTER — Encounter: Payer: Self-pay | Admitting: Gastroenterology

## 2018-05-31 NOTE — Progress Notes (Signed)
[facility] Attending  Case discussed with Dr. Winfrey at the time of the visit.  We reviewed the resident's history and exam and pertinent patient test results.  I agree with the assessment, diagnosis, and plan of care documented in the resident's note.  Alexander Raines, M.D., Ph.D.  

## 2018-06-17 ENCOUNTER — Encounter: Payer: Self-pay | Admitting: Pulmonary Disease

## 2018-06-17 ENCOUNTER — Other Ambulatory Visit: Payer: Self-pay | Admitting: Pulmonary Disease

## 2018-06-17 ENCOUNTER — Ambulatory Visit (INDEPENDENT_AMBULATORY_CARE_PROVIDER_SITE_OTHER): Payer: Medicare HMO | Admitting: Pulmonary Disease

## 2018-06-17 DIAGNOSIS — J441 Chronic obstructive pulmonary disease with (acute) exacerbation: Secondary | ICD-10-CM | POA: Diagnosis not present

## 2018-06-17 MED ORDER — IPRATROPIUM-ALBUTEROL 0.5-2.5 (3) MG/3ML IN SOLN: 3.0000 mL | RESPIRATORY_TRACT | 6 refills | Status: DC | PRN

## 2018-06-17 MED ORDER — PREDNISONE 10 MG PO TABS: 10.0000 mg | ORAL_TABLET | Freq: Two times a day (BID) | ORAL | 0 refills | Status: AC

## 2018-06-17 MED ORDER — AZITHROMYCIN 250 MG PO TABS: ORAL_TABLET | ORAL | 0 refills | Status: DC

## 2018-06-17 NOTE — Patient Instructions (Signed)
COPD exacerbation Advanced COPD  Bronchitis History of sarcoidosis  We will give your course of antibiotics and a course of steroids Switch from albuterol to DuoNeb via nebulizer to be used 3-4 times daily  I will see her back in the office in about 3 months We will order a CT scan of the chest to assess for the chronic changes in the lungs and also further evaluate your recent weight loss  Call with significant concerns

## 2018-06-17 NOTE — Progress Notes (Signed)
[name]    [id number]    10-15-1945  Primary Care Physician:Winfrey, Jenne Pane, MD  Referring Physician: Katherine Roan, MD [address] [place], [place] [address]  Chief complaint:  Patient with a history of chronic obstructive pulmonary disease, complaining of shortness of breath, cough, recent weight loss  HPI:   Patient with a history of COPD History of cough, shortness of breath, increased mucus production Yellow phlegm-now a bit clearer  COPD diagnosis about 2003 History of sarcoidosis about the same time-did use a chronic course of steroids back then  Has not seen a pulmonologist recently  Has recently lost some weight, poor appetite, this was associated with the time that she recently lost her husband about 3 years ago She is less active recently   Occupation: No pertinent history Exposures: Smoking history: Reformed smoker Travel history: Relevant family history:  Outpatient Encounter Medications as of 06/17/2018  Medication Sig  . acetaminophen (TYLENOL) 325 MG tablet Take 650 mg by mouth as needed.  Marland Kitchen albuterol (PROVENTIL HFA;VENTOLIN HFA) 108 (90 Base) MCG/ACT inhaler Inhale 1-2 puffs into the lungs every 6 (six) hours as needed for wheezing or shortness of breath.  Marland Kitchen albuterol (PROVENTIL) (2.5 MG/3ML) 0.083% nebulizer solution Take 3 mLs (2.5 mg total) by nebulization every 6 (six) hours as needed. As needed for SOB  . amLODipine (NORVASC) 5 MG tablet Take 1 tablet (5 mg total) by mouth daily.  . calcium-vitamin D (OS-CAL 500 + D) 500-200 MG-UNIT tablet Take 1 tablet by mouth 2 (two) times daily.  . metoprolol succinate (TOPROL-XL) 50 MG 24 hr tablet Take with or immediately following a meal.  . mirtazapine (REMERON) 15 MG tablet Take 1 tablet (15 mg total) by mouth at bedtime.  . polyethylene glycol (MIRALAX) packet Take 17 g by mouth daily.  . pravastatin (PRAVACHOL) 40 MG tablet TAKE 1 TABLET BY MOUTH EVERY DAY IN THE EVENING  .  ipratropium-albuterol (DUONEB) 0.5-2.5 (3) MG/3ML SOLN Take 3 mLs by nebulization every 4 (four) hours as needed.   Facility-Administered Encounter Medications as of 06/17/2018  Medication  . 0.9 %  sodium chloride infusion    Allergies as of 06/17/2018 - Review Complete 06/17/2018  Allergen Reaction Noted  . Ace inhibitors Swelling 10/09/2011  . Lisinopril Swelling   . Valsartan Swelling   . Aspirin Other (See Comments)   . Penicillins  06/08/2007    Past Medical History:  Diagnosis Date  . Blind left eye   . Cataract of both eyes 01/18/2009   Right surgically repaired  . 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  . Depression 06/08/2007  . Essential hypertension, benign 06/08/2007  . GERD 01/18/2009  . Osteoporosis 12/31/2012   Left femur had T score -2.5 on DEXA scan 02/26/2009.   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 Sarcoidosis 02/05/2009   PFTs 03/30/2009: FVC 67%, FEV1 48%, FEV1/FVC 52%, TLC 104%, DLCO 71%.    Past Surgical History:  Procedure Laterality Date  . CATARACT EXTRACTION Right   . COLONOSCOPY  2002  . HIP ARTHROPLASTY Right   . TUBAL LIGATION      Family History  Problem Relation Age of Onset  . Cancer Mother   . Heart disease Mother   . Breast cancer Mother   . Stroke Father   . Cancer Sister   . Heart disease  Sister   . Asthma Daughter   . Cancer Maternal Aunt   . Cancer Other   . Diabetes Other   . Hypertension Other   . Colon cancer Neg Hx     Social History   Socioeconomic History  . Marital status: Married    Spouse name: Not on file  . Number of children: Not on file  . Years of education: Not on file  . Highest education level: Not on file  Occupational History  . Not on file  Social Needs  . Financial resource strain: Not on file  . Food insecurity:    Worry: Not on file    Inability: Not on file  . Transportation needs:    Medical: Not  on file    Non-medical: Not on file  Tobacco Use  . Smoking status: Former Smoker    Packs/day: 0.25    Years: 30.00    Pack years: 7.50    Last attempt to quit: 05/11/2010    Years since quitting: 8.1  . Smokeless tobacco: Never Used  Substance and Sexual Activity  . Alcohol use: Yes    Alcohol/week: 0.0 standard drinks    Comment: wine cooler.  . Drug use: No  . Sexual activity: Not on file  Lifestyle  . Physical activity:    Days per week: Not on file    Minutes per session: Not on file  . Stress: Not on file  Relationships  . Social connections:    Talks on phone: Not on file    Gets together: Not on file    Attends religious service: Not on file    Active member of club or organization: Not on file    Attends meetings of clubs or organizations: Not on file    Relationship status: Not on file  . Intimate partner violence:    Fear of current or ex partner: Not on file    Emotionally abused: Not on file    Physically abused: Not on file    Forced sexual activity: Not on file  Other Topics Concern  . Not on file  Social History Narrative   Married with 4 children. Retired, previously worked as a Scientist, clinical (histocompatibility and immunogenetics).    Review of Systems  Constitutional: Positive for appetite change and unexpected weight change.  HENT: Negative.   Eyes: Negative.   Respiratory: Positive for cough and shortness of breath.   Cardiovascular: Negative.   Gastrointestinal: Negative.   Endocrine: Negative.   Genitourinary: Negative.   Musculoskeletal: Negative.     Vitals:   06/17/18 1119  BP: 118/64  Pulse: 86  SpO2: 94%     Physical Exam  Constitutional: She is oriented to person, place, and time. She appears well-developed and well-nourished.  HENT:  Head: Normocephalic and atraumatic.  Eyes: Right eye exhibits no discharge. Left eye exhibits no discharge.  Neck: Normal range of motion. Neck supple. No tracheal deviation present. No thyromegaly present.  Cardiovascular:  Normal rate and regular rhythm.  Pulmonary/Chest: Effort normal and breath sounds normal. No respiratory distress. She has no wheezes.  Abdominal: Soft. Bowel sounds are normal. She exhibits no distension. There is no tenderness. There is no rebound.  Musculoskeletal: Normal range of motion. She exhibits no edema or deformity.  Neurological: She is alert and oriented to person, place, and time. She has normal reflexes. No cranial nerve deficit. Coordination normal.  Skin: Skin is warm and dry. No rash noted. She is not diaphoretic. No erythema.  Psychiatric:  She has a normal mood and affect.    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 COPD exacerbation Recent weight loss  Plan/Recommendations:  Obtain a CT scan of the chest with contrast Encouraged to increase activity  A course of azithromycin Course of steroids  Cannot afford her Advair, she tries to use a nebulizer with albuterol on a regular basis We will switch to DuoNeb to be used 3-4 times a day  I will see her back in the office in about 3 months  Encouraged to call with any significant concerns We will call her head CT results comes available  PFT will be helpful but I doubt she can perform the maneuvers at present   Sherrilyn Rist MD [facility] 06/17/2018, 11:51 AM  CC: Katherine Roan, MD

## 2018-07-02 ENCOUNTER — Ambulatory Visit (INDEPENDENT_AMBULATORY_CARE_PROVIDER_SITE_OTHER)
Admission: RE | Admit: 2018-07-02 | Discharge: 2018-07-02 | Disposition: A | Discharge status: Home / Self Care | Payer: Medicare HMO | Source: Ambulatory Visit | Attending: Pulmonary Disease | Admitting: Pulmonary Disease

## 2018-07-02 DIAGNOSIS — J441 Chronic obstructive pulmonary disease with (acute) exacerbation: Secondary | ICD-10-CM

## 2018-07-02 DIAGNOSIS — R918 Other nonspecific abnormal finding of lung field: Secondary | ICD-10-CM | POA: Diagnosis not present

## 2018-07-02 MED ORDER — IOPAMIDOL (ISOVUE-300) INJECTION 61%
80.0000 mL | Freq: Once | INTRAVENOUS | Status: AC | PRN
  Administered 2018-07-02: 80 mL via INTRAVENOUS

## 2018-07-03 ENCOUNTER — Other Ambulatory Visit: Payer: Self-pay | Admitting: Pulmonary Disease

## 2018-07-15 ENCOUNTER — Telehealth: Payer: Self-pay | Admitting: *Deleted

## 2018-07-15 DIAGNOSIS — K219 Gastro-esophageal reflux disease without esophagitis: Secondary | ICD-10-CM

## 2018-07-15 NOTE — Telephone Encounter (Signed)
Pt request PANTOPRAZOLE 40MG  TAKE 1 TABLET DAILY BY MOUTH #90

## 2018-07-16 ENCOUNTER — Other Ambulatory Visit: Payer: Self-pay | Admitting: Pulmonary Disease

## 2018-07-16 ENCOUNTER — Other Ambulatory Visit: Payer: Self-pay | Admitting: Internal Medicine

## 2018-07-16 MED ORDER — PANTOPRAZOLE SODIUM 40 MG PO TBEC: 40.0000 mg | DELAYED_RELEASE_TABLET | Freq: Every day | ORAL | 2 refills | Status: DC

## 2018-07-16 NOTE — Telephone Encounter (Signed)
refilled 

## 2018-07-16 NOTE — Telephone Encounter (Signed)
Refilled

## 2018-07-19 ENCOUNTER — Encounter: Payer: Medicare HMO | Admitting: Internal Medicine

## 2018-09-17 ENCOUNTER — Ambulatory Visit: Payer: Medicare HMO | Admitting: Pulmonary Disease

## 2018-10-10 ENCOUNTER — Other Ambulatory Visit: Payer: Self-pay | Admitting: Internal Medicine

## 2018-10-10 DIAGNOSIS — K219 Gastro-esophageal reflux disease without esophagitis: Secondary | ICD-10-CM

## 2018-10-20 ENCOUNTER — Ambulatory Visit (INDEPENDENT_AMBULATORY_CARE_PROVIDER_SITE_OTHER): Payer: Medicare Other | Admitting: Internal Medicine

## 2018-10-20 ENCOUNTER — Encounter: Payer: Self-pay | Admitting: Internal Medicine

## 2018-10-20 ENCOUNTER — Other Ambulatory Visit: Payer: Self-pay

## 2018-10-20 ENCOUNTER — Ambulatory Visit (INDEPENDENT_AMBULATORY_CARE_PROVIDER_SITE_OTHER): Payer: Medicare Other

## 2018-10-20 VITALS — BP 110/70 | HR 99 | Temp 98.5°F | Ht 66.0 in | Wt 106.2 lb

## 2018-10-20 DIAGNOSIS — R05 Cough: Secondary | ICD-10-CM | POA: Diagnosis not present

## 2018-10-20 DIAGNOSIS — D869 Sarcoidosis, unspecified: Secondary | ICD-10-CM

## 2018-10-20 DIAGNOSIS — J432 Centrilobular emphysema: Secondary | ICD-10-CM | POA: Diagnosis not present

## 2018-10-20 DIAGNOSIS — E46 Unspecified protein-calorie malnutrition: Secondary | ICD-10-CM

## 2018-10-20 DIAGNOSIS — I10 Essential (primary) hypertension: Secondary | ICD-10-CM | POA: Diagnosis not present

## 2018-10-20 DIAGNOSIS — R634 Abnormal weight loss: Secondary | ICD-10-CM

## 2018-10-20 DIAGNOSIS — J439 Emphysema, unspecified: Secondary | ICD-10-CM

## 2018-10-20 DIAGNOSIS — E785 Hyperlipidemia, unspecified: Secondary | ICD-10-CM

## 2018-10-20 DIAGNOSIS — K219 Gastro-esophageal reflux disease without esophagitis: Secondary | ICD-10-CM

## 2018-10-20 MED ORDER — ALBUTEROL SULFATE HFA 108 (90 BASE) MCG/ACT IN AERS: 1.0000 | INHALATION_SPRAY | Freq: Four times a day (QID) | RESPIRATORY_TRACT | 6 refills | Status: DC | PRN

## 2018-10-20 NOTE — Patient Instructions (Addendum)
Great meeting you this afternoon!  Instructions: -chest x-ray today, will notify you as soon as results are available. -Stop taking protonix. -Continue remeron for appetite. -Schedule follow up in 3-4 for physical and medicare annual visit with me (same day). Please come fasting to that appointment.

## 2018-10-20 NOTE — Progress Notes (Signed)
New Patient Office Visit     CC/Reason for Visit: Establish care, follow up chronic conditions, cough Previous PCP: [facility] Last Visit: 10/19  HPI: [name] is a 73 y.o. female who is coming in today for the above mentioned reasons. Past Medical History is significant for: HTN, sarcoidosis, unintentional weight loss, HLD, GERD (who has been off PPI for some time without any symptoms). She also has a h/o sarcoidosis and mild COPD/asthma. Has not seen pulmonary in a few years, used to see Dr. Gwenette Greet. She has started to have cough, non-productive, ongoing for a few weeks, no SOB, no sick contacts, no recent travel, no fever or CP.   Past Medical/Surgical History: Past Medical History:  Diagnosis Date  . Blind left eye   . Cataract of both eyes 01/18/2009   Right surgically repaired  . COPD with emphysema ([place]) 02/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  . Depression 06/08/2007  . Essential hypertension, benign 06/08/2007  . GERD 01/18/2009  . Osteoporosis 12/31/2012   Left femur had T score -2.5 on DEXA scan 02/26/2009.   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 Sarcoidosis 02/05/2009   PFTs 03/30/2009: FVC 67%, FEV1 48%, FEV1/FVC 52%, TLC 104%, DLCO 71%.    Past Surgical History:  Procedure Laterality Date  . CATARACT EXTRACTION Right   . COLONOSCOPY  2002  . HIP ARTHROPLASTY Right   . TUBAL LIGATION      Social History:  reports that she quit smoking about 8 years ago. She has a 7.50 pack-year smoking history. She has never used smokeless tobacco. She reports current alcohol use. She reports that she does not use drugs.  Allergies: Allergies  Allergen Reactions  . Ace Inhibitors Swelling  . Lisinopril Swelling    REACTION: Angioedema  . Valsartan Swelling    REACTION: angioedema.  Pt should not get ACEI or ARBs of any kind due to angioedema.  . Aspirin Other (See Comments)    REACTION: GI ulcer.   . Penicillins     Childhood allergy Has patient had a PCN reaction causing immediate rash, facial/tongue/throat swelling, SOB or lightheadedness with hypotension: Unknown Has patient had a PCN reaction causing severe rash involving mucus membranes or skin necrosis: Unknown Has patient had a PCN reaction that required hospitalization: Unknown Has patient had a PCN reaction occurring within the last 10 years: Unknown If all of the above answers are "NO", then may proceed with Cephalosporin use.     Family History:  Family History  Problem Relation Age of Onset  . Cancer Mother   . Heart disease Mother   . Breast cancer Mother   . Stroke Father   . Cancer Sister   . Heart disease Sister   . Asthma Daughter   . Cancer Maternal Aunt   . Cancer Other   . Diabetes Other   . Hypertension Other   . Colon cancer Neg Hx      Current Outpatient Medications:  .  acetaminophen (TYLENOL) 325 MG tablet, Take 650 mg by mouth as needed., Disp: , Rfl:  .  albuterol (PROVENTIL HFA;VENTOLIN HFA) 108 (90 Base) MCG/ACT inhaler, Inhale 1-2 puffs into the lungs every 6 (six) hours as needed for wheezing or shortness of breath., Disp: 3 Inhaler, Rfl: 6 .  albuterol (PROVENTIL) (2.5 MG/3ML) 0.083% nebulizer solution, Take 3 mLs (2.5 mg total) by nebulization every 6 (six) hours as  needed. As needed for SOB, Disp: 75 mL, Rfl: 3 .  amLODipine (NORVASC) 5 MG tablet, Take 1 tablet (5 mg total) by mouth daily., Disp: 90 tablet, Rfl: 3 .  ipratropium-albuterol (DUONEB) 0.5-2.5 (3) MG/3ML SOLN, USE 3 ML VIA NEBULIZER EVERY 4 HOURS AS NEEDED, Disp: 2970 mL, Rfl: 6 .  metoprolol succinate (TOPROL-XL) 50 MG 24 hr tablet, Take with or immediately following a meal., Disp: 90 tablet, Rfl: 3 .  mirtazapine (REMERON) 15 MG tablet, Take 1 tablet (15 mg total) by mouth at bedtime., Disp: 90 tablet, Rfl: 1 .  polyethylene glycol (MIRALAX) packet, Take 17 g by mouth daily., Disp: 14 each, Rfl: 3 .  pravastatin (PRAVACHOL)  40 MG tablet, TAKE 1 TABLET BY MOUTH EVERY DAY IN THE EVENING, Disp: 60 tablet, Rfl: 2 .  calcium-vitamin D (OS-CAL 500 + D) 500-200 MG-UNIT tablet, Take 1 tablet by mouth 2 (two) times daily. (Patient not taking: Reported on 10/20/2018), Disp: 60 tablet, Rfl: 11  Current Facility-Administered Medications:  .  0.9 %  sodium chloride infusion, 500 mL, Intravenous, Once, Ladene Artist, MD  Review of Systems:  Constitutional: Denies fever, chills, diaphoresis and fatigue.  HEENT: Denies photophobia, eye pain, redness, hearing loss, ear pain, congestion, sore throat, rhinorrhea, sneezing, mouth sores, trouble swallowing, neck pain, neck stiffness and tinnitus.   Respiratory: Denies SOB, DOE,  chest tightness,  and wheezing.   Cardiovascular: Denies chest pain, palpitations and leg swelling.  Gastrointestinal: Denies nausea, vomiting, abdominal pain, diarrhea, constipation, blood in stool and abdominal distention.  Genitourinary: Denies dysuria, urgency, frequency, hematuria, flank pain and difficulty urinating.  Endocrine: Denies: hot or cold intolerance, sweats, changes in hair or nails, polyuria, polydipsia. Musculoskeletal: Denies myalgias, back pain, joint swelling, arthralgias and gait problem.  Skin: Denies pallor, rash and wound.  Neurological: Denies dizziness, seizures, syncope, weakness, light-headedness, numbness and headaches.  Hematological: Denies adenopathy. Easy bruising, personal or family bleeding history  Psychiatric/Behavioral: Denies suicidal ideation, mood changes, confusion, nervousness, sleep disturbance and agitation    Physical Exam: Vitals:   10/20/18 1517  BP: 110/70  Pulse: 99  Temp: 98.5 F (36.9 C)  TempSrc: Oral  SpO2: 96%  Weight: 106 lb 3.2 oz (48.2 kg)  Height: 5\' 6"  (1.676 m)   Body mass index is 17.14 kg/m.  Constitutional: NAD, calm, comfortable, underweight-appearing Eyes: PERRL, lids and conjunctivae normal ENMT: Mucous membranes are moist.   Respiratory: clear to auscultation bilaterally, no wheezing, no crackles. Normal respiratory effort. No accessory muscle use.  Cardiovascular: Regular rate and rhythm, no murmurs / rubs / gallops. No extremity edema. 2+ pedal pulses. No carotid bruits.  Abdomen: no tenderness, no masses palpated. No hepatosplenomegaly. Bowel sounds positive.  Musculoskeletal: no clubbing / cyanosis. No joint deformity upper and lower extremities. Good ROM, no contractures. Normal muscle tone.  Neurologic: grossly intact and non-focal Psychiatric: Normal judgment and insight. Alert and oriented x 3. Normal mood.    Impression and Plan:  Essential hypertension, benign -Well controlled on current meds  Centrilobular emphysema (HCC) - -Would like albuterol MDI refill.  Dyslipidemia -no LDL on file, on pravastatin  Sarcoidosis  -CXR, given cough; may need to refer back to pulmonary.  Protein-calorie malnutrition, unspecified severity ([place]) Unintentional weight loss -Continue remeron, Ensure. -only 0.8 lb weight loss since October per chart.  Gastroesophageal reflux disease without esophagitis -Since off meds and asx, will ask to hold off on PPIs for now.     Patient Instructions  Great meeting you this afternoon!  Instructions: -chest x-ray today, will notify you as soon as results are available. -Stop taking protonix. -Continue remeron for appetite. -Schedule follow up in 3-4 for physical and medicare annual visit with me (same day). Please come fasting to that appointment.     Lelon Frohlich, MD [facility]

## 2018-10-21 ENCOUNTER — Other Ambulatory Visit: Payer: Self-pay | Admitting: Internal Medicine

## 2018-10-21 DIAGNOSIS — D869 Sarcoidosis, unspecified: Secondary | ICD-10-CM

## 2018-11-10 ENCOUNTER — Telehealth: Payer: Self-pay | Admitting: Internal Medicine

## 2018-11-10 NOTE — Telephone Encounter (Signed)
Copied from [place] [id number] [phone]. Topic: Quick Communication - See Telephone Encounter >> Nov 10, 2018 10:10 AM [name], Helene Kelp D wrote: CRM for notification. See Telephone encounter for: 11/10/18. Patient called and said that she has not got a call from Dr. Jerilee Hoh about her chest x-ray. She would like a call back please.

## 2018-11-11 ENCOUNTER — Other Ambulatory Visit: Payer: Self-pay | Admitting: Internal Medicine

## 2018-11-16 ENCOUNTER — Other Ambulatory Visit: Payer: Self-pay | Admitting: Internal Medicine

## 2018-11-16 DIAGNOSIS — J432 Centrilobular emphysema: Secondary | ICD-10-CM

## 2018-11-18 NOTE — Telephone Encounter (Signed)
Left message on machine for patient to return our call CRM 

## 2018-11-22 ENCOUNTER — Other Ambulatory Visit: Payer: Self-pay | Admitting: Internal Medicine

## 2018-12-14 ENCOUNTER — Ambulatory Visit: Payer: Medicare Other | Admitting: Primary Care

## 2018-12-21 ENCOUNTER — Ambulatory Visit: Payer: Medicare Other | Admitting: Primary Care

## 2018-12-21 NOTE — Progress Notes (Deleted)
@Patient  ID: [name], female    DOB: Sep 01, 1945, 73 y.o.   MRN: [id number]  No chief complaint on file.   Referring provider: Isaac Bliss, [place]*  HPI: 74 year old female, former smoker quit 201. PMH COPD, allergic rhinitis, sarcoidosis, HTN, GERD. Patient of Dr. Ander Slade, seen for initial consult on 06/17/18. Treated with course azithromycin and steroids. Unable to afford Advair, changed to Duonebs 3-4 times a day. Recommended 3 month follow-up.  CT chest in November showed pulmonary parenchymal changes compatible with fibrosis. Similar appearing extensive calcified mediastinal hilar adenopathy compatible with sarcoidosis.   12/21/2018 Patient presents today for follow-up visit. She has had a cough since January.    Allergies  Allergen Reactions  . Ace Inhibitors Swelling  . Lisinopril Swelling    REACTION: Angioedema  . Valsartan Swelling    REACTION: angioedema.  Pt should not get ACEI or ARBs of any kind due to angioedema.  . Aspirin Other (See Comments)    REACTION: GI ulcer.  . Penicillins     Childhood allergy Has patient had a PCN reaction causing immediate rash, facial/tongue/throat swelling, SOB or lightheadedness with hypotension: Unknown Has patient had a PCN reaction causing severe rash involving mucus membranes or skin necrosis: Unknown Has patient had a PCN reaction that required hospitalization: Unknown Has patient had a PCN reaction occurring within the last 10 years: Unknown If all of the above answers are "NO", then may proceed with Cephalosporin use.     Immunization History  Administered Date(s) Administered  . Influenza Split 05/28/2011  . Influenza Whole 06/06/2009  . Influenza, High Dose Seasonal PF 06/29/2017  . Influenza,inj,Quad PF,6+ Mos 04/15/2013, 04/11/2014, 10/02/2015, 08/18/2016, 05/24/2018  . Pneumococcal Conjugate-13 08/18/2016  . Pneumococcal Polysaccharide-23 08/11/2005, 05/28/2011  . Td 08/11/2005, 08/11/2017    Past  Medical History:  Diagnosis Date  . Blind left eye   . Cataract of both eyes 01/18/2009   Right surgically repaired  . 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  . Depression 06/08/2007  . Essential hypertension, benign 06/08/2007  . GERD 01/18/2009  . Osteoporosis 12/31/2012   Left femur had T score -2.5 on DEXA scan 02/26/2009.   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 Sarcoidosis 02/05/2009   PFTs 03/30/2009: FVC 67%, FEV1 48%, FEV1/FVC 52%, TLC 104%, DLCO 71%.    Tobacco History: Social History   Tobacco Use  Smoking Status Former Smoker  . Packs/day: 0.25  . Years: 30.00  . Pack years: 7.50  . Last attempt to quit: 05/11/2010  . Years since quitting: 8.6  Smokeless Tobacco Never Used   Counseling given: Not Answered   Outpatient Medications Prior to Visit  Medication Sig Dispense Refill  . acetaminophen (TYLENOL) 325 MG tablet Take 650 mg by mouth as needed.    Marland Kitchen albuterol (PROVENTIL HFA;VENTOLIN HFA) 108 (90 Base) MCG/ACT inhaler Inhale 1-2 puffs into the lungs every 6 (six) hours as needed for wheezing or shortness of breath. 3 Inhaler 6  . albuterol (PROVENTIL) (2.5 MG/3ML) 0.083% nebulizer solution Take 3 mLs (2.5 mg total) by nebulization every 6 (six) hours as needed. As needed for SOB 75 mL 3  . amLODipine (NORVASC) 5 MG tablet Take 1 tablet (5 mg total) by mouth daily. 90 tablet 3  . calcium-vitamin D (OS-CAL 500 + D) 500-200 MG-UNIT tablet Take 1 tablet by mouth 2 (two) times daily. (Patient not taking:  Reported on 10/20/2018) 60 tablet 11  . ipratropium-albuterol (DUONEB) 0.5-2.5 (3) MG/3ML SOLN USE 3 ML VIA NEBULIZER EVERY 4 HOURS AS NEEDED 2970 mL 6  . metoprolol succinate (TOPROL-XL) 50 MG 24 hr tablet Take with or immediately following a meal. 90 tablet 3  . mirtazapine (REMERON) 15 MG tablet Take 1 tablet (15 mg total) by mouth at bedtime. 90 tablet 1  . polyethylene glycol  (MIRALAX) packet Take 17 g by mouth daily. 14 each 3  . pravastatin (PRAVACHOL) 40 MG tablet TAKE 1 TABLET BY MOUTH EVERY DAY IN THE EVENING 60 tablet 2   Facility-Administered Medications Prior to Visit  Medication Dose Route Frequency Provider Last Rate Last Dose  . 0.9 %  sodium chloride infusion  500 mL Intravenous Once Ladene Artist, MD          Review of Systems  Review of Systems   Physical Exam  There were no vitals taken for this visit. Physical Exam   Lab Results:  CBC    Component Value Date/Time   WBC 5.5 02/10/2018 1839   RBC 4.33 02/10/2018 1839   HGB 12.6 02/10/2018 1839   HCT 36.2 02/10/2018 1839   PLT 357 02/10/2018 1839   MCV 83.6 02/10/2018 1839   MCH 29.1 02/10/2018 1839   MCHC 34.8 02/10/2018 1839   RDW 14.9 02/10/2018 1839   LYMPHSABS 1.4 02/10/2018 1839   MONOABS 0.5 02/10/2018 1839   EOSABS 0.0 02/10/2018 1839   BASOSABS 0.0 02/10/2018 1839    BMET    Component Value Date/Time   NA 137 05/24/2018 1535   K 3.5 05/24/2018 1535   CL 93 (L) 05/24/2018 1535   CO2 26 05/24/2018 1535   GLUCOSE 86 05/24/2018 1535   GLUCOSE 99 02/10/2018 1839   BUN 5 (L) 05/24/2018 1535   CREATININE 0.88 05/24/2018 1535   CREATININE 0.96 09/12/2014 1632   CALCIUM 8.6 (L) 05/24/2018 1535   CALCIUM 8.6 (L) 05/24/2018 1535   GFRNONAA 66 05/24/2018 1535   GFRNONAA 61 09/12/2014 1632   GFRAA 76 05/24/2018 1535   GFRAA 70 09/12/2014 1632    BNP No results found for: BNP  ProBNP No results found for: PROBNP  Imaging: No results found.   Assessment & Plan:   No problem-specific Assessment & Plan notes found for this encounter.     Martyn Ehrich, NP 12/21/2018

## 2019-01-05 ENCOUNTER — Other Ambulatory Visit: Payer: Self-pay | Admitting: Internal Medicine

## 2019-01-05 DIAGNOSIS — R634 Abnormal weight loss: Secondary | ICD-10-CM

## 2019-01-08 ENCOUNTER — Other Ambulatory Visit: Payer: Self-pay | Admitting: Internal Medicine

## 2019-01-08 DIAGNOSIS — I10 Essential (primary) hypertension: Secondary | ICD-10-CM

## 2019-01-12 ENCOUNTER — Telehealth: Payer: Self-pay | Admitting: Pulmonary Disease

## 2019-01-12 ENCOUNTER — Other Ambulatory Visit: Payer: Self-pay | Admitting: Internal Medicine

## 2019-01-12 NOTE — Telephone Encounter (Signed)
Attempted to call pt but unable to reach. Left message for pt to return call.  Pt's meds that she has requested a refill for were prescribed by Dr. Guadlupe Spanish so she needs to contact his office in regards to receiving refills of meds. When pt calls back, please state this to her. Thanks!

## 2019-01-13 NOTE — Telephone Encounter (Signed)
Returned call and spoke with patient's son (per dpr) as patient was 'sleeping.'  Advised son that medication refills should be requested from Dr. Shan Levans as he is the prescribing physician. Son states he thought that was who he was calling and would contact them or the pharmacy to request these refills.  Nothing further needed.

## 2019-02-22 ENCOUNTER — Telehealth: Payer: Self-pay | Admitting: Internal Medicine

## 2019-02-22 DIAGNOSIS — I10 Essential (primary) hypertension: Secondary | ICD-10-CM

## 2019-02-22 MED ORDER — PRAVASTATIN SODIUM 40 MG PO TABS: ORAL_TABLET | ORAL | 1 refills | Status: DC

## 2019-02-22 MED ORDER — METOPROLOL SUCCINATE ER 50 MG PO TB24: ORAL_TABLET | ORAL | 1 refills | Status: DC

## 2019-02-22 MED ORDER — PANTOPRAZOLE SODIUM 40 MG PO TBEC: 40.0000 mg | DELAYED_RELEASE_TABLET | Freq: Every day | ORAL | 1 refills | Status: DC

## 2019-02-22 MED ORDER — AMLODIPINE BESYLATE 5 MG PO TABS: 5.0000 mg | ORAL_TABLET | Freq: Every day | ORAL | 1 refills | Status: DC

## 2019-02-22 NOTE — Telephone Encounter (Signed)
Refills sent.  Miralax and Oscal can be bought OTC.

## 2019-02-22 NOTE — Telephone Encounter (Signed)
Copied from [place] [id number]. Topic: Quick Communication - Rx Refill/Question >> Feb 22, 2019  9:46 AM Leward Quan A wrote: Medication: pantoprazole (PROTONIX) 40 MG tablet, amLODipine (NORVASC) 5 MG tablet, calcium-vitamin D (OS-CAL 500 + D) 500-200 MG-UNIT tablet, metoprolol succinate (TOPROL-XL) 50 MG 24 hr tablet, pravastatin (PRAVACHOL) 40 MG tablet, polyethylene glycol (MIRALAX) packet   Has the patient contacted their pharmacy? Yes.   (Agent: If no, request that the patient contact the pharmacy for the refill.) (Agent: If yes, when and what did the pharmacy advise?)  Preferred Pharmacy (with phone number or street name): [id number] - [place], [place] [phone] (Phone) [phone] (Fax)    Agent: Please be advised that RX refills may take up to 3 business days. We ask that you follow-up with your pharmacy.

## 2019-03-29 ENCOUNTER — Other Ambulatory Visit: Payer: Self-pay | Admitting: Internal Medicine

## 2019-03-29 DIAGNOSIS — R634 Abnormal weight loss: Secondary | ICD-10-CM

## 2019-03-30 ENCOUNTER — Other Ambulatory Visit: Payer: Self-pay | Admitting: Internal Medicine

## 2019-03-30 DIAGNOSIS — R634 Abnormal weight loss: Secondary | ICD-10-CM

## 2019-04-12 ENCOUNTER — Other Ambulatory Visit: Payer: Self-pay | Admitting: Internal Medicine

## 2019-04-12 DIAGNOSIS — R634 Abnormal weight loss: Secondary | ICD-10-CM

## 2019-04-15 ENCOUNTER — Other Ambulatory Visit: Payer: Self-pay | Admitting: Internal Medicine

## 2019-04-15 DIAGNOSIS — J432 Centrilobular emphysema: Secondary | ICD-10-CM

## 2019-04-15 DIAGNOSIS — J439 Emphysema, unspecified: Secondary | ICD-10-CM

## 2019-04-15 MED ORDER — ALBUTEROL SULFATE (2.5 MG/3ML) 0.083% IN NEBU: 2.5000 mg | INHALATION_SOLUTION | Freq: Four times a day (QID) | RESPIRATORY_TRACT | 3 refills | Status: DC | PRN

## 2019-04-15 MED ORDER — ALBUTEROL SULFATE HFA 108 (90 BASE) MCG/ACT IN AERS: 1.0000 | INHALATION_SPRAY | Freq: Four times a day (QID) | RESPIRATORY_TRACT | 1 refills | Status: DC | PRN

## 2019-04-15 NOTE — Telephone Encounter (Signed)
Requested medication (s) are due for refill today: yes  Requested medication (s) are on the active medication list: yes   Future visit scheduled: no  Notes to clinic:  Review for refill   Requested Prescriptions  Pending Prescriptions Disp Refills   albuterol (VENTOLIN HFA) 108 (90 Base) MCG/ACT inhaler      Sig: Inhale 1-2 puffs into the lungs every 6 (six) hours as needed for wheezing or shortness of breath.     Pulmonology:  Beta Agonists Failed - 04/15/2019 10:56 AM      Failed - One inhaler should last at least one month. If the patient is requesting refills earlier, contact the patient to check for uncontrolled symptoms.      Passed - Valid encounter within last 12 months    Recent Outpatient Visits          5 months ago Essential hypertension, benign   Therapist, music at [facility], Rayford Halsted, MD              albuterol (PROVENTIL) (2.5 MG/3ML) 0.083% nebulizer solution 75 mL 3    Sig: Take 3 mLs (2.5 mg total) by nebulization every 6 (six) hours as needed. As needed for SOB     Pulmonology:  Beta Agonists Failed - 04/15/2019 10:56 AM      Failed - One inhaler should last at least one month. If the patient is requesting refills earlier, contact the patient to check for uncontrolled symptoms.      Passed - Valid encounter within last 12 months    Recent Outpatient Visits          5 months ago Essential hypertension, benign   Therapist, music at [facility], Rayford Halsted, MD

## 2019-04-15 NOTE — Telephone Encounter (Signed)
See request °

## 2019-04-15 NOTE — Telephone Encounter (Signed)
Medication Refill - Medication:   albuterol (PROVENTIL HFA;VENTOLIN HFA) 108 (90 Base) MCG/ACT inhaler    albuterol (PROVENTIL) (2.5 MG/3ML) 0.083% nebulizer solution    Has the patient contacted their pharmacy? yes (Agent: If no, request that the patient contact the pharmacy for the refill.) (Agent: If yes, when and what did the pharmacy advise?)  Preferred Pharmacy (with phone number or street name):  [id number] - [place], [place] [phone] (Phone) [phone] (Fax)   Agent: Please be advised that RX refills may take up to 3 business days. We ask that you follow-up with your pharmacy.

## 2019-05-31 ENCOUNTER — Encounter (HOSPITAL_COMMUNITY): Payer: Self-pay

## 2019-05-31 ENCOUNTER — Emergency Department (HOSPITAL_COMMUNITY): Payer: Medicare Other

## 2019-05-31 ENCOUNTER — Other Ambulatory Visit: Payer: Self-pay

## 2019-05-31 ENCOUNTER — Inpatient Hospital Stay (HOSPITAL_COMMUNITY)
Admission: EM | Admit: 2019-05-31 | Discharge: 2019-06-07 | DRG: 871 | Disposition: A | Discharge status: Home Health | Payer: Medicare Other | Attending: Internal Medicine | Admitting: Internal Medicine

## 2019-05-31 DIAGNOSIS — I272 Pulmonary hypertension, unspecified: Secondary | ICD-10-CM

## 2019-05-31 DIAGNOSIS — R579 Shock, unspecified: Secondary | ICD-10-CM

## 2019-05-31 DIAGNOSIS — J9621 Acute and chronic respiratory failure with hypoxia: Secondary | ICD-10-CM | POA: Diagnosis present

## 2019-05-31 DIAGNOSIS — I361 Nonrheumatic tricuspid (valve) insufficiency: Secondary | ICD-10-CM | POA: Diagnosis not present

## 2019-05-31 DIAGNOSIS — I071 Rheumatic tricuspid insufficiency: Secondary | ICD-10-CM | POA: Diagnosis present

## 2019-05-31 DIAGNOSIS — J189 Pneumonia, unspecified organism: Secondary | ICD-10-CM | POA: Diagnosis present

## 2019-05-31 DIAGNOSIS — I10 Essential (primary) hypertension: Secondary | ICD-10-CM | POA: Diagnosis present

## 2019-05-31 DIAGNOSIS — Z452 Encounter for adjustment and management of vascular access device: Secondary | ICD-10-CM

## 2019-05-31 DIAGNOSIS — Z789 Other specified health status: Secondary | ICD-10-CM | POA: Diagnosis not present

## 2019-05-31 DIAGNOSIS — D869 Sarcoidosis, unspecified: Secondary | ICD-10-CM | POA: Diagnosis present

## 2019-05-31 DIAGNOSIS — R4182 Altered mental status, unspecified: Secondary | ICD-10-CM | POA: Diagnosis present

## 2019-05-31 DIAGNOSIS — R6520 Severe sepsis without septic shock: Secondary | ICD-10-CM

## 2019-05-31 DIAGNOSIS — Z888 Allergy status to other drugs, medicaments and biological substances status: Secondary | ICD-10-CM

## 2019-05-31 DIAGNOSIS — K589 Irritable bowel syndrome without diarrhea: Secondary | ICD-10-CM | POA: Diagnosis present

## 2019-05-31 DIAGNOSIS — J439 Emphysema, unspecified: Secondary | ICD-10-CM | POA: Diagnosis present

## 2019-05-31 DIAGNOSIS — E871 Hypo-osmolality and hyponatremia: Secondary | ICD-10-CM | POA: Diagnosis present

## 2019-05-31 DIAGNOSIS — J309 Allergic rhinitis, unspecified: Secondary | ICD-10-CM | POA: Diagnosis present

## 2019-05-31 DIAGNOSIS — Z20828 Contact with and (suspected) exposure to other viral communicable diseases: Secondary | ICD-10-CM | POA: Diagnosis present

## 2019-05-31 DIAGNOSIS — E162 Hypoglycemia, unspecified: Secondary | ICD-10-CM

## 2019-05-31 DIAGNOSIS — E785 Hyperlipidemia, unspecified: Secondary | ICD-10-CM | POA: Diagnosis present

## 2019-05-31 DIAGNOSIS — G2581 Restless legs syndrome: Secondary | ICD-10-CM | POA: Diagnosis present

## 2019-05-31 DIAGNOSIS — Z96641 Presence of right artificial hip joint: Secondary | ICD-10-CM | POA: Diagnosis present

## 2019-05-31 DIAGNOSIS — Z6821 Body mass index (BMI) 21.0-21.9, adult: Secondary | ICD-10-CM

## 2019-05-31 DIAGNOSIS — G9340 Encephalopathy, unspecified: Secondary | ICD-10-CM | POA: Diagnosis not present

## 2019-05-31 DIAGNOSIS — N179 Acute kidney failure, unspecified: Secondary | ICD-10-CM

## 2019-05-31 DIAGNOSIS — R609 Edema, unspecified: Secondary | ICD-10-CM

## 2019-05-31 DIAGNOSIS — I4581 Long QT syndrome: Secondary | ICD-10-CM | POA: Diagnosis present

## 2019-05-31 DIAGNOSIS — Z823 Family history of stroke: Secondary | ICD-10-CM

## 2019-05-31 DIAGNOSIS — I2721 Secondary pulmonary arterial hypertension: Secondary | ICD-10-CM | POA: Diagnosis present

## 2019-05-31 DIAGNOSIS — K219 Gastro-esophageal reflux disease without esophagitis: Secondary | ICD-10-CM | POA: Diagnosis present

## 2019-05-31 DIAGNOSIS — D696 Thrombocytopenia, unspecified: Secondary | ICD-10-CM | POA: Diagnosis present

## 2019-05-31 DIAGNOSIS — R9431 Abnormal electrocardiogram [ECG] [EKG]: Secondary | ICD-10-CM | POA: Diagnosis not present

## 2019-05-31 DIAGNOSIS — Z87891 Personal history of nicotine dependence: Secondary | ICD-10-CM

## 2019-05-31 DIAGNOSIS — G9341 Metabolic encephalopathy: Secondary | ICD-10-CM | POA: Diagnosis present

## 2019-05-31 DIAGNOSIS — A419 Sepsis, unspecified organism: Secondary | ICD-10-CM | POA: Diagnosis present

## 2019-05-31 DIAGNOSIS — M810 Age-related osteoporosis without current pathological fracture: Secondary | ICD-10-CM | POA: Diagnosis present

## 2019-05-31 DIAGNOSIS — Z9841 Cataract extraction status, right eye: Secondary | ICD-10-CM

## 2019-05-31 DIAGNOSIS — E876 Hypokalemia: Secondary | ICD-10-CM | POA: Diagnosis not present

## 2019-05-31 DIAGNOSIS — H5462 Unqualified visual loss, left eye, normal vision right eye: Secondary | ICD-10-CM | POA: Diagnosis present

## 2019-05-31 DIAGNOSIS — J441 Chronic obstructive pulmonary disease with (acute) exacerbation: Secondary | ICD-10-CM | POA: Diagnosis not present

## 2019-05-31 DIAGNOSIS — Z886 Allergy status to analgesic agent status: Secondary | ICD-10-CM

## 2019-05-31 DIAGNOSIS — R64 Cachexia: Secondary | ICD-10-CM | POA: Diagnosis present

## 2019-05-31 DIAGNOSIS — Z880 Allergy status to penicillin: Secondary | ICD-10-CM

## 2019-05-31 DIAGNOSIS — Z7952 Long term (current) use of systemic steroids: Secondary | ICD-10-CM

## 2019-05-31 DIAGNOSIS — Z8249 Family history of ischemic heart disease and other diseases of the circulatory system: Secondary | ICD-10-CM

## 2019-05-31 DIAGNOSIS — Z825 Family history of asthma and other chronic lower respiratory diseases: Secondary | ICD-10-CM

## 2019-05-31 DIAGNOSIS — E861 Hypovolemia: Secondary | ICD-10-CM | POA: Diagnosis present

## 2019-05-31 DIAGNOSIS — Z9181 History of falling: Secondary | ICD-10-CM

## 2019-05-31 DIAGNOSIS — R6521 Severe sepsis with septic shock: Secondary | ICD-10-CM | POA: Diagnosis present

## 2019-05-31 DIAGNOSIS — D649 Anemia, unspecified: Secondary | ICD-10-CM | POA: Diagnosis present

## 2019-05-31 DIAGNOSIS — J9601 Acute respiratory failure with hypoxia: Secondary | ICD-10-CM | POA: Diagnosis not present

## 2019-05-31 DIAGNOSIS — R000 Tachycardia, unspecified: Secondary | ICD-10-CM | POA: Diagnosis not present

## 2019-05-31 DIAGNOSIS — R296 Repeated falls: Secondary | ICD-10-CM | POA: Diagnosis present

## 2019-05-31 DIAGNOSIS — M1612 Unilateral primary osteoarthritis, left hip: Secondary | ICD-10-CM | POA: Diagnosis present

## 2019-05-31 DIAGNOSIS — Z9851 Tubal ligation status: Secondary | ICD-10-CM

## 2019-05-31 DIAGNOSIS — R1310 Dysphagia, unspecified: Secondary | ICD-10-CM | POA: Diagnosis present

## 2019-05-31 LAB — COMPREHENSIVE METABOLIC PANEL
ALT: 19 U/L (ref 0–44)
AST: 38 U/L (ref 15–41)
Albumin: 2 g/dL — ABNORMAL LOW (ref 3.5–5.0)
Alkaline Phosphatase: 134 U/L — ABNORMAL HIGH (ref 38–126)
Anion gap: 11 (ref 5–15)
BUN: 32 mg/dL — ABNORMAL HIGH (ref 8–23)
CO2: 25 mmol/L (ref 22–32)
Calcium: 7.3 mg/dL — ABNORMAL LOW (ref 8.9–10.3)
Chloride: 89 mmol/L — ABNORMAL LOW (ref 98–111)
Creatinine, Ser: 2.62 mg/dL — ABNORMAL HIGH (ref 0.44–1.00)
GFR calc Af Amer: 20 mL/min — ABNORMAL LOW (ref >60)
GFR calc non Af Amer: 17 mL/min — ABNORMAL LOW (ref >60)
Glucose, Bld: 56 mg/dL — ABNORMAL LOW (ref 70–99)
Potassium: 4.7 mmol/L (ref 3.5–5.1)
Sodium: 125 mmol/L — ABNORMAL LOW (ref 135–145)
Total Bilirubin: 1.1 mg/dL (ref 0.3–1.2)
Total Protein: 4.8 g/dL — ABNORMAL LOW (ref 6.5–8.1)

## 2019-05-31 LAB — CBC WITH DIFFERENTIAL/PLATELET
Abs Immature Granulocytes: 0.22 10*3/uL — ABNORMAL HIGH (ref 0.00–0.07)
Basophils Absolute: 0 10*3/uL (ref 0.0–0.1)
Basophils Relative: 0 %
Eosinophils Absolute: 0 10*3/uL (ref 0.0–0.5)
Eosinophils Relative: 0 %
HCT: 30 % — ABNORMAL LOW (ref 36.0–46.0)
Hemoglobin: 10 g/dL — ABNORMAL LOW (ref 12.0–15.0)
Immature Granulocytes: 2 %
Lymphocytes Relative: 5 %
Lymphs Abs: 0.4 10*3/uL — ABNORMAL LOW (ref 0.7–4.0)
MCH: 28.8 pg (ref 26.0–34.0)
MCHC: 33.3 g/dL (ref 30.0–36.0)
MCV: 86.5 fL (ref 80.0–100.0)
Monocytes Absolute: 0.7 10*3/uL (ref 0.1–1.0)
Monocytes Relative: 8 %
Neutro Abs: 7.8 10*3/uL — ABNORMAL HIGH (ref 1.7–7.7)
Neutrophils Relative %: 85 %
Platelets: 146 10*3/uL — ABNORMAL LOW (ref 150–400)
RBC: 3.47 MIL/uL — ABNORMAL LOW (ref 3.87–5.11)
RDW: 15.9 % — ABNORMAL HIGH (ref 11.5–15.5)
WBC: 9.2 10*3/uL (ref 4.0–10.5)
nRBC: 0 % (ref 0.0–0.2)

## 2019-05-31 LAB — COOXEMETRY PANEL
Carboxyhemoglobin: 1 % (ref 0.5–1.5)
Methemoglobin: 0.9 % (ref 0.0–1.5)
O2 Saturation: 62.3 %
Total hemoglobin: 10.2 g/dL — ABNORMAL LOW (ref 12.0–16.0)

## 2019-05-31 LAB — LACTIC ACID, PLASMA: Lactic Acid, Venous: 4.3 mmol/L (ref 0.5–1.9)

## 2019-05-31 LAB — BRAIN NATRIURETIC PEPTIDE: B Natriuretic Peptide: 626.6 pg/mL — ABNORMAL HIGH (ref 0.0–100.0)

## 2019-05-31 LAB — LIPASE, BLOOD: Lipase: 13 U/L (ref 11–51)

## 2019-05-31 LAB — SARS CORONAVIRUS 2 BY RT PCR (HOSPITAL ORDER, PERFORMED IN ~~LOC~~ HOSPITAL LAB): SARS Coronavirus 2: NEGATIVE

## 2019-05-31 MED ORDER — IPRATROPIUM-ALBUTEROL 0.5-2.5 (3) MG/3ML IN SOLN
3.0000 mL | Freq: Four times a day (QID) | RESPIRATORY_TRACT | Status: DC
  Administered 2019-05-31 – 2019-06-04 (×14): 3 mL via RESPIRATORY_TRACT
  Filled 2019-05-31 (×14): qty 3

## 2019-05-31 MED ORDER — SODIUM CHLORIDE 0.9 % IV BOLUS
1000.0000 mL | Freq: Once | INTRAVENOUS | Status: AC
  Administered 2019-05-31: 1000 mL via INTRAVENOUS

## 2019-05-31 MED ORDER — DEXAMETHASONE SODIUM PHOSPHATE 4 MG/ML IJ SOLN
4.0000 mg | Freq: Four times a day (QID) | INTRAMUSCULAR | Status: DC
  Administered 2019-05-31 – 2019-06-01 (×3): 4 mg via INTRAVENOUS
  Filled 2019-05-31 (×3): qty 1

## 2019-05-31 MED ORDER — NOREPINEPHRINE 4 MG/250ML-% IV SOLN: 0.0000 ug/min | INTRAVENOUS | Status: DC

## 2019-05-31 MED ORDER — HEPARIN SODIUM (PORCINE) 5000 UNIT/ML IJ SOLN
5000.0000 [IU] | Freq: Three times a day (TID) | INTRAMUSCULAR | Status: DC
  Administered 2019-06-01 – 2019-06-07 (×20): 5000 [IU] via SUBCUTANEOUS
  Filled 2019-05-31 (×21): qty 1

## 2019-05-31 MED ORDER — LEVOFLOXACIN IN D5W 750 MG/150ML IV SOLN
750.0000 mg | Freq: Once | INTRAVENOUS | Status: AC
  Administered 2019-05-31: 750 mg via INTRAVENOUS
  Filled 2019-05-31: qty 150

## 2019-05-31 MED ORDER — NOREPINEPHRINE 4 MG/250ML-% IV SOLN
2.0000 ug/min | INTRAVENOUS | Status: DC
  Administered 2019-05-31: 2 ug/min via INTRAVENOUS
  Administered 2019-06-01: 10 ug/min via INTRAVENOUS
  Administered 2019-06-01: 9 ug/min via INTRAVENOUS
  Administered 2019-06-02: 10 ug/min via INTRAVENOUS
  Filled 2019-05-31 (×7): qty 250

## 2019-05-31 MED ORDER — VANCOMYCIN HCL IN DEXTROSE 1-5 GM/200ML-% IV SOLN
1000.0000 mg | Freq: Once | INTRAVENOUS | Status: AC
  Administered 2019-05-31: 1000 mg via INTRAVENOUS
  Filled 2019-05-31: qty 200

## 2019-05-31 MED ORDER — DEXTROSE 50 % IV SOLN
25.0000 mL | Freq: Once | INTRAVENOUS | Status: AC
  Administered 2019-05-31: 25 mL via INTRAVENOUS
  Filled 2019-05-31: qty 50

## 2019-05-31 MED ORDER — SODIUM CHLORIDE 0.9 % IV BOLUS
500.0000 mL | Freq: Once | INTRAVENOUS | Status: AC
  Administered 2019-05-31: 500 mL via INTRAVENOUS

## 2019-05-31 MED ORDER — PANTOPRAZOLE SODIUM 40 MG IV SOLR
40.0000 mg | Freq: Every day | INTRAVENOUS | Status: DC
  Administered 2019-05-31 – 2019-06-03 (×4): 40 mg via INTRAVENOUS
  Filled 2019-05-31 (×4): qty 40

## 2019-05-31 MED ORDER — CHLORHEXIDINE GLUCONATE CLOTH 2 % EX PADS
6.0000 | MEDICATED_PAD | Freq: Every day | CUTANEOUS | Status: DC
  Administered 2019-06-01 – 2019-06-05 (×5): 6 via TOPICAL

## 2019-05-31 MED ORDER — SODIUM CHLORIDE 0.9 % IV SOLN
250.0000 mL | INTRAVENOUS | Status: DC
  Administered 2019-05-31 – 2019-06-06 (×2): 250 mL via INTRAVENOUS

## 2019-05-31 NOTE — ED Provider Notes (Signed)
[facility] DEPT Provider Note   CSN: [id number] Arrival date & time: 05/31/19  1709     History   Chief Complaint Chief Complaint  Patient presents with  . Altered Mental Status  . Leg Swelling    HPI [name] is a 73 y.o. female.     HPI Patient states of the last day she has developed cough, bilateral lower extremity swelling and diarrhea.  Diarrhea is nonbloody.  She currently is denying any abdominal pain.  She denies any chest pain.  No definite fever or chills.  Patient does have lightheadedness especially with standing.  Denies urinary complaints. Past Medical History:  Diagnosis Date  . Blind left eye   . Cataract of both eyes 01/18/2009   Right surgically repaired  . 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  . Depression 06/08/2007  . Essential hypertension, benign 06/08/2007  . GERD 01/18/2009  . Osteoporosis 12/31/2012   Left femur had T score -2.5 on DEXA scan 02/26/2009.   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 Sarcoidosis 02/05/2009   PFTs 03/30/2009: FVC 67%, FEV1 48%, FEV1/FVC 52%, TLC 104%, DLCO 71%.    Patient Active Problem List   Diagnosis Date Noted  . Low bone mass 05/24/2018  . IBS (irritable bowel syndrome) 03/01/2018  . Diarrhea 12/22/2017  . Falls 12/26/2015  . Mixed incontinence urge and stress 12/26/2015  . Hypokalemia 10/03/2015  . Health care maintenance 09/12/2014  . Dyslipidemia 12/31/2012  . Allergic rhinitis 12/31/2012  . Osteoarthritis, hip, bilateral 11/01/2010  . Unintentional weight loss 03/19/2009  . 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 Sarcoidosis 02/05/2009  . GERD 01/18/2009  . Essential hypertension, benign 06/08/2007    Past Surgical History:  Procedure Laterality Date  . CATARACT EXTRACTION Right   . COLONOSCOPY  2002  . HIP ARTHROPLASTY Right   . TUBAL LIGATION       OB History    No obstetric history on file.      Home Medications    Prior to Admission medications   Medication Sig Start Date End Date Taking? Authorizing Provider  acetaminophen (TYLENOL) 325 MG tablet Take 650 mg by mouth as needed.    [provider]  albuterol (PROVENTIL) (2.5 MG/3ML) 0.083% nebulizer solution Take 3 mLs (2.5 mg total) by nebulization every 6 (six) hours as needed. As needed for SOB 04/15/19   Isaac Bliss, Rayford Halsted, MD  albuterol (VENTOLIN HFA) 108 (90 Base) MCG/ACT inhaler Inhale 1-2 puffs into the lungs every 6 (six) hours as needed for wheezing or shortness of breath. 04/15/19 04/14/20  Isaac Bliss, Rayford Halsted, MD  amLODipine (NORVASC) 5 MG tablet Take 1 tablet (5 mg total) by mouth daily. 02/22/19 02/22/20  Erline Hau, MD  calcium-vitamin D (OS-CAL 500 + D) 500-200 MG-UNIT tablet Take 1 tablet by mouth 2 (two) times daily. Patient not taking: Reported on 10/20/2018 05/24/18   Katherine Roan, MD  ipratropium-albuterol (DUONEB) 0.5-2.5 (3) MG/3ML SOLN USE 3 ML VIA NEBULIZER EVERY 4 HOURS AS NEEDED 06/18/18   Sherrilyn Rist A, MD  metoprolol succinate (TOPROL-XL) 50 MG 24 hr tablet Take with or immediately following a meal. 02/22/19   Isaac Bliss, Rayford Halsted, MD  mirtazapine (REMERON) 15 MG tablet Take 1 tablet (15 mg total) by mouth at bedtime. 03/01/18   Katherine Roan, MD  pantoprazole (PROTONIX) 40 MG  tablet Take 1 tablet (40 mg total) by mouth daily. 02/22/19   Isaac Bliss, Rayford Halsted, MD  polyethylene glycol [facility]) packet Take 17 g by mouth daily. 05/24/18   Katherine Roan, MD  pravastatin (PRAVACHOL) 40 MG tablet TAKE 1 TABLET BY MOUTH EVERY DAY IN THE EVENING 02/22/19   Isaac Bliss, Rayford Halsted, MD    Family History Family History  Problem Relation Age of Onset  . Cancer Mother   . Heart disease Mother   . Breast cancer Mother   . Stroke Father   . Cancer Sister   . Heart disease Sister   . Asthma Daughter   . Cancer  Maternal Aunt   . Cancer Other   . Diabetes Other   . Hypertension Other   . Colon cancer Neg Hx     Social History Social History   Tobacco Use  . Smoking status: Former Smoker    Packs/day: 0.25    Years: 30.00    Pack years: 7.50    Quit date: 05/11/2010    Years since quitting: 9.0  . Smokeless tobacco: Never Used  Substance Use Topics  . Alcohol use: Yes    Alcohol/week: 0.0 standard drinks    Comment: wine cooler.  . Drug use: No     Allergies   Ace inhibitors, Lisinopril, Valsartan, Aspirin, and Penicillins   Review of Systems Review of Systems  Constitutional: Positive for fatigue. Negative for chills and fever.  HENT: Negative for sore throat and trouble swallowing.   Eyes: Negative for visual disturbance.  Respiratory: Positive for cough, shortness of breath and wheezing.   Cardiovascular: Positive for leg swelling. Negative for chest pain and palpitations.  Gastrointestinal: Positive for diarrhea. Negative for abdominal pain, blood in stool, constipation, nausea and vomiting.  Genitourinary: Negative for dysuria, flank pain and frequency.  Musculoskeletal: Positive for back pain and myalgias. Negative for neck pain and neck stiffness.  Skin: Negative for rash and wound.  Neurological: Positive for weakness and light-headedness. Negative for syncope, speech difficulty, numbness and headaches.  All other systems reviewed and are negative.    Physical Exam Updated Vital Signs BP (!) 70/56   Pulse 79   Temp 98 F (36.7 C) (Oral)   Resp 17   SpO2 99%   Physical Exam Vitals signs and nursing note reviewed.  Constitutional:      General: She is not in acute distress.    Appearance: Normal appearance. She is well-developed. She is not ill-appearing.  HENT:     Head: Normocephalic and atraumatic.     Nose: Nose normal.     Mouth/Throat:     Mouth: Mucous membranes are moist.  Eyes:     Extraocular Movements: Extraocular movements intact.     Pupils:  Pupils are equal, round, and reactive to light.  Neck:     Musculoskeletal: Normal range of motion and neck supple. No neck rigidity or muscular tenderness.  Cardiovascular:     Rate and Rhythm: Normal rate and regular rhythm.  Pulmonary:     Effort: Pulmonary effort is normal.     Breath sounds: Wheezing and rales present.     Comments: Patient with few scattered expiratory wheezes.  Crackles and rails in the bilateral bases, right greater than left. Abdominal:     General: Bowel sounds are normal. There is no distension.     Palpations: Abdomen is soft.     Tenderness: There is no abdominal tenderness. There is no guarding or rebound.  Musculoskeletal: Normal range of motion.        General: No tenderness.     Right lower leg: Edema present.     Left lower leg: Edema present.     Comments: 2+ bilateral lower extremity pitting edema.  No asymmetry or calf tenderness.  Lymphadenopathy:     Cervical: No cervical adenopathy.  Skin:    General: Skin is warm and dry.     Findings: No erythema or rash.  Neurological:     General: No focal deficit present.     Mental Status: She is alert and oriented to person, place, and time.     Comments: Awake and alert.  Speaks in a clear voice.  No facial asymmetry.  5/5 motor in all extremities.  Sensation intact.  Psychiatric:        Behavior: Behavior normal.      ED Treatments / Results  Labs (all labs ordered are listed, but only abnormal results are displayed) Labs Reviewed  CBC WITH DIFFERENTIAL/PLATELET - Abnormal; Notable for the following components:      Result Value   RBC 3.47 (*)    Hemoglobin 10.0 (*)    HCT 30.0 (*)    RDW 15.9 (*)    Platelets 146 (*)    Neutro Abs 7.8 (*)    Lymphs Abs 0.4 (*)    Abs Immature Granulocytes 0.22 (*)    All other components within normal limits  COMPREHENSIVE METABOLIC PANEL - Abnormal; Notable for the following components:   Sodium 125 (*)    Chloride 89 (*)    Glucose, Bld 56 (*)     BUN 32 (*)    Creatinine, Ser 2.62 (*)    Calcium 7.3 (*)    Total Protein 4.8 (*)    Albumin 2.0 (*)    Alkaline Phosphatase 134 (*)    GFR calc non Af Amer 17 (*)    GFR calc Af Amer 20 (*)    All other components within normal limits  BRAIN NATRIURETIC PEPTIDE - Abnormal; Notable for the following components:   B Natriuretic Peptide 626.6 (*)    All other components within normal limits  LACTIC ACID, PLASMA - Abnormal; Notable for the following components:   Lactic Acid, Venous 4.3 (*)    All other components within normal limits  SARS CORONAVIRUS 2 BY RT PCR (HOSPITAL ORDER, [facility])  CULTURE, BLOOD (ROUTINE X 2)  CULTURE, BLOOD (ROUTINE X 2)  LIPASE, BLOOD  URINALYSIS, ROUTINE W REFLEX MICROSCOPIC  CORTISOL    EKG EKG Interpretation  Date/Time:  Tuesday May 31 2019 18:56:35 EDT Ventricular Rate:  87 PR Interval:    QRS Duration: 94 QT Interval:  407 QTC Calculation: 490 R Axis:   -16 Text Interpretation:  Sinus rhythm Borderline left axis deviation Low voltage, extremity and precordial leads Borderline prolonged QT interval Confirmed by Julianne Rice [phone]) on 05/31/2019 8:43:34 PM   Radiology Dg Chest Port 1 View  Result Date: 05/31/2019 CLINICAL DATA:  Cough, former smoker, COPD with emphysema and sarcoidosis. EXAM: PORTABLE CHEST 1 VIEW COMPARISON:  Radiograph 10/20/2018, CT 07/02/2018 FINDINGS: There is diffuse coarse interstitial opacities throughout the lungs with scarring and fibrotic changes similar to comparison exam and extensive calcified mediastinal and hilar adenopathy compatible with patient's provided history of sarcoidosis. Increasing opacity is present in the left lung base and right perihilar region. No pneumothorax. No visible effusion. Cardiomediastinal contours are otherwise unremarkable. Bones are diffusely demineralized. Subchondral cystic  changes are present in both humeri, similar to comparisons. No acute  osseous or soft tissue abnormality. IMPRESSION: Diffuse architectural changes and calcified mediastinal and hilar adenopathy compatible with history of sarcoidosis. Increasing opacity in the right perihilar region and left lung base could reflect interval worsening of patient's underlying disease versus superimposed process either infection or edema. Electronically Signed   By: Lovena Le M.D.   On: 05/31/2019 18:36    Procedures Procedures (including critical care time)  Medications Ordered in ED Medications  vancomycin (VANCOCIN) IVPB 1000 mg/200 mL premix (has no administration in time range)  levofloxacin (LEVAQUIN) IVPB 750 mg (750 mg Intravenous New Bag/Given 05/31/19 1926)  0.9 %  sodium chloride infusion (has no administration in time range)  norepinephrine (LEVOPHED) 4mg  in 244mL premix infusion (10 mcg/min Intravenous Rate/Dose Change 05/31/19 2045)  dexamethasone (DECADRON) injection 4 mg (has no administration in time range)  sodium chloride 0.9 % bolus 500 mL (0 mLs Intravenous Stopped 05/31/19 1859)  dextrose 50 % solution 25 mL (25 mLs Intravenous Given 05/31/19 1910)  sodium chloride 0.9 % bolus 1,000 mL (0 mLs Intravenous Stopped 05/31/19 2006)    CRITICAL CARE Performed by: Julianne Rice Total critical care time: 35 minutes Critical care time was exclusive of separately billable procedures and treating other patients. Critical care was necessary to treat or prevent imminent or life-threatening deterioration. Critical care was time spent personally by me on the following activities: development of treatment plan with patient and/or surrogate as well as nursing, discussions with consultants, evaluation of patient's response to treatment, examination of patient, obtaining history from patient or surrogate, ordering and performing treatments and interventions, ordering and review of laboratory studies, ordering and review of radiographic studies, pulse oximetry and  re-evaluation of patient's condition. Initial Impression / Assessment and Plan / ED Course  I have reviewed the triage vital signs and the nursing notes.  Pertinent labs & imaging results that were available during my care of the patient were reviewed by me and considered in my medical decision making (see chart for details).       Patient with persistent hypotension.  Elevated lactic acid.  Initiated broad-spectrum IV antibiotics and 30 cc/kg IV fluid bolus.  Patient still hypotensive though mentating well.  She states she is having some more difficulty with her breathing.  Concern for fluid overload.  Initiated Levophed.  Discussed with intensivist.  Recommends giving the patient 4 mg of Decadron and sending a cortisol level.  They will evaluate the patient in the emergency department.   Final Clinical Impressions(s) / ED Diagnoses   Final diagnoses:  Severe sepsis ([place])  Peripheral edema  AKI (acute kidney injury) [facility])    ED Discharge Orders    None       Julianne Rice, MD 05/31/19 2046

## 2019-05-31 NOTE — ED Notes (Signed)
Date and time results received: 05/31/19 1857 (use smartphrase ".now" to insert current time)  Test: lactic acid Critical Value: 4.3  Name of Provider Notified: Lita Mains  Orders Received? Or Actions Taken?: Orders

## 2019-05-31 NOTE — ED Notes (Signed)
Pt alert and oriented watching TV. Not in distress. Pt skin dry and warm. Pt talking in complete sentences. Pt denies any pain at this time.

## 2019-05-31 NOTE — Progress Notes (Signed)
eLink Physician-Brief Progress Note Patient Name: [name] DOB: November 24, 1945 MRN: [id number]   Date of Service  05/31/2019  HPI/Events of Note  Pt with a history of sarcoidosis and COPD admitted to the ICU with bilateral pneumonia and septic shock. She is COVID-19 negative.  eICU Interventions  New patient evaluation completed.        [name] 05/31/2019, 10:51 PM

## 2019-05-31 NOTE — H&P (Addendum)
..   NAME:  [name], MRN:  [id number], DOB:  01/04/1946, LOS: 0 ADMISSION DATE:  05/31/2019, CONSULTATION DATE:  05/31/2019 REFERRING MD:  Julianne Rice, CHIEF COMPLAINT:  AMS and Leg Swelling   Brief History   73 yr old F w/ PMHx Sarcoidosis, Pulm HTN, presents w/ bilat lower ext sweelling and diarrhea. Afebrile hypotensive Given 30cc/kg in ED for Sepsis LA 4.3. Started on Levophed when pt did not improve. PCCM asked to admit  History of present illness   73 yr old F w/ PMHx Sarcoidosis, COPD, GERD, HTN, Depression(not currently on any meds), dyslipidemia and IBS presented to [facility] on 10/20 with complaints of generalized weakness, AMS, hypotension, abdominal pain, and bilateral leg swelling. Per patient's family Today she had an episode of bowel incontinence followed by slurring of her words, and stating she had to go to work (she hasn't worked in years). She continued to have bowel movements followed by lower ext weakness. Her daughter denies that she lost consciousness. Pt was oriented to person but she was still slurring her words and had episodes of short term memory loss. They took her SBP 117 mmHg Her daughter states that she uses a walker to mobilize around the house and has had frequent episodes of falls last incident when 10/18 when she tried to ambulate to the bathroom.  Per the EDP documentation: over the last day patient developed a cough, bilateral lower ext swelling and non bloody diarrhea.  Upon evaluation in the ED she denied, abdominal pain, chest pain, subjective fevers or chills. She did state that she had lightheadedness esp with standing.    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   GERD 01/18/2009   Unintentional weight loss 03/19/2009   Osteoarthritis, hip, bilateral 11/01/2010   Dyslipidemia 12/31/2012    Allergic rhinitis 12/31/2012   Health care maintenance 09/12/2014   Hypokalemia 10/03/2015   Falls 12/26/2015   Mixed incontinence urge and stress 12/26/2015   Diarrhea 12/22/2017   IBS (irritable bowel syndrome) 03/01/2018   Low bone mass 05/24/2018   Resolved Ambulatory Problems    Diagnosis Date Noted   Hyposmolality and/or hyponatremia 06/19/2010   HYPOKALEMIA 04/26/2009   Depression 06/08/2007   Cataract, left eye 01/18/2009   CLAUDICATION 06/28/2010   VIRAL URI 06/27/2010   ALLERGIC RHINITIS 06/08/2007   CHRONIC OBSTRUCTIVE PULMONARY DISEASE, ACUTE EXACERBATION 05/25/2009   Recurrent dislocation of hip 06/28/2010   FATIGUE 01/18/2009   Night sweats 01/18/2009   WEIGHT LOSS, ABNORMAL 01/18/2009   Epistaxis 01/18/2009   Cough 02/28/2009   CHEST PAIN-PRECORDIAL 06/28/2010   Abnormal CT scan 06/02/2009   Personal history of colonic polyps 01/18/2009   ANKLE EDEMA 07/23/2010   Internal hemorrhoids 11/01/2010   Anxiety disorder 11/01/2010   Itching 05/28/2011   Screening 03/02/2012   Grief 10/03/2015   History of epistaxis 03/21/2016   Past Medical History:  Diagnosis Date   Blind left eye    Cataract of both eyes 01/18/2009   Depression 06/08/2007   Osteoporosis 12/31/2012     Significant Hospital Events   Hypotensive received 30cc/kg IVF>>still hypotensive started on Levophed gtt.  Consults:  10/20>> PCCM  Procedures:  None prior to PCCM evaluation  Significant Diagnostic Tests:  CT CHEST W/ CONTRAST 06/2018 1. Pulmonary parenchymal changes compatible with fibrosis given history of sarcoidosis. Similar-appearing extensive calcified mediastinal hilar adenopathy, also  compatible with sarcoidosis. 2. Subpleural nodular opacities within the left lower lobe may represent progression of disease or potentially infectious/inflammatory process. 3. Aortic Atherosclerosis  ECHO 2011 Left ventricle: The cavity size was normal.  There was mild focal   basal hypertrophy of the septum. Systolic function was normal. The   estimated ejection fraction was in the range of 55% to 60%.   Although no diagnostic regional wall motion abnormality was   identified, this possibility cannot be completely excluded on the   basis of this study. Doppler parameters are consistent with   abnormal left ventricular relaxation (grade 1 diastolic   dysfunction).  - Aortic valve: There was no stenosis.  - Mitral valve: No significant regurgitation.  - Right ventricle: Poorly visualized. The cavity size was normal.   Systolic function was normal.  - Pulmonary arteries: PA systolic pressure 51- 55 mmHg.  - Systemic veins: IVC measured 1.8 cm with normal respirophasic   variation, suggesting RA pressure 6-10 mmHg.  - Pericardium, extracardiac: A trivial pericardial effusion was   identified.   CXR FINDINGS 05/31/2019: There is diffuse coarse interstitial opacities throughout the lungs with scarring and fibrotic changes similar to comparison exam and extensive calcified mediastinal and hilar adenopathy compatible with patient's provided history of sarcoidosis. Increasing opacity is present in the left lung base and right perihilar region. No pneumothorax. No visible effusion. Cardiomediastinal contours are otherwise unremarkable. Bones are diffusely demineralized. Subchondral cystic changes are present in both humeri, similar to comparisons. No acute osseous or soft tissue abnormality.  05/31/2019 CXR IMPRESSION: Diffuse architectural changes and calcified mediastinal and hilar adenopathy compatible with history of sarcoidosis. Increasing opacity in the right perihilar region and left lung base could reflect interval worsening of patient's underlying disease versus superimposed process either infection or edema.   Micro Data:  SARSCOV2 negative RVP>>pending GI Path panel>>  pending Sputum>>pending Blood cx >>pending Antimicrobials:  10/20>>Vancomycin in ED 10/20>>Levaquin   Objective   Blood pressure (!) 83/58, pulse 80, temperature 98 F (36.7 C), temperature source Oral, resp. rate 18, SpO2 99 %.        Intake/Output Summary (Last 24 hours) at 05/31/2019 2059 Last data filed at 05/31/2019 2006 Gross per 24 hour  Intake 1000 ml  Output --  Net 1000 ml   There were no vitals filed for this visit.  Examination: General: alert in no acute distress  HENT: intermittent cough,  Lungs: no wheeze fine crackles bilaterally decreased breath sounds at base Cardiovascular: S1 and S2 appreciated no rub no murmur no gallop Abdomen: soft non distended mild tenderness epigastric no guarding Extremities: +3 pitting edema in bilateral extremities Neuro: alert awake speaking in full sentences oriented to place and person.  Skin: dry poor turgor intact   Assessment & Plan:  1. Shock:  Right sided heart failure vs Sepsis Given h/o Pulmonary HTN on ECHO in 2011 B/l lower ext swelling , elevated LA and BNP, and abdominal discomfort Episodes of presyncope, Hyponatremic on BMET>>>Strong suspicion for [facility] Increasing opacity in the right perihilar region and left lung base could also be secondary to cardiogenic edema EKG shows borderline left axis deviation, with prolonged QTc and low voltage in precordial leads Plan: Admit to ICU 2D echo to assess LVEF. Continue Vasopressors for MAP goal >33mmHg Place A-line Place RIJ CVL>>check MVO2 and CVP Avoid QT prolonging meds if possible F/u [facility]  2. Sepsis? H/o diarrhea (pt on Miralax at home and has a h/o IBS), hypotension and elevated LA Increasing  opacity in the right perihilar region and left lung base- ? Pneumonia Was on Doxycycline at home Received 30 cc/kg with no improvement.  Plan: Started on empiric abx in ED w/ Vancomycin and Levaquin F/u stool studies, blood cx, UA, RVP and Resp  cx Check PCT>> if low discontinue antibiotics Trend WBC, fever curve  3. Sarcoidosis, COPD with emphysema PFTs 03/30/2009: FVC 67%, FEV1 48%, FEV1/FVC 52%, TLC 104%, DLCO 71%. No consistent Pulmonary follow up Last seen 06/2018>>On that visit she had weight loss, cough and SOB Home meds include Duonebs and Pro Air Imaging shows end stage fibrotic changes Was on home prednisone 20 mg BID and Doxyxyxline Plan: Continue on Duonebs Q 6 PRN for SOB Started on Decadron in ED per Elink, will continue  4. Hypoglycemic Received D 50  Low Albumin>> poor nutritional status Started on Decadron Plan: Started on diet F/u cortisol level Continue fingersticks  5. Acute Kidney Injury BUN 32 Creat 2.62 GFR 20 W/ hyponatremia>> most likely hypovolemic hyponatremia Possibly Cardiorenal Plan: F/u U/A F/u repeat BMET Avoid nephrotoxic meds  6. Acute Encephalopathy Slurred words, h/o fall and presyncopal episodes May be secondary to hypotension Pt currently Alert awake and oriented to person and place Plan: Fall precautions Neurochecks   Best practice:  Diet: liquids Pain/Anxiety/Delirium protocol (if indicated): not needed no c/o pain VAP protocol (if indicated): not intubated DVT prophylaxis: Hep SQ w/ SCDs GI prophylaxis: Protonix Glucose control: hypoglycemic on admission Mobility: Bedrest Code Status: Full Family Communication: Daughter [name] [phone])   [phone] (cell phone) we discussed her clinical condition and the patient's prognosis.  We discussed Intubation, vasopressors and aggressive interventions. The daughter expressed that she did not want her mother to suffer but she did want reversible issues to be addressed.  Pt will remain a full code at this time. I think patient's family may benefit from a palliative care consult given her medical history and age.  Disposition: ICU  Labs   CBC: Recent Labs  Lab 05/31/19 1821  WBC 9.2  NEUTROABS 7.8*  HGB  10.0*  HCT 30.0*  MCV 86.5  PLT 146*    Basic Metabolic Panel: Recent Labs  Lab 05/31/19 1821  NA 125*  K 4.7  CL 89*  CO2 25  GLUCOSE 56*  BUN 32*  CREATININE 2.62*  CALCIUM 7.3*   GFR: CrCl cannot be calculated (Unknown ideal weight.). Recent Labs  Lab 05/31/19 1819 05/31/19 1821  WBC  --  9.2  LATICACIDVEN 4.3*  --     Liver Function Tests: Recent Labs  Lab 05/31/19 1821  AST 38  ALT 19  ALKPHOS 134*  BILITOT 1.1  PROT 4.8*  ALBUMIN 2.0*   Recent Labs  Lab 05/31/19 1821  LIPASE 13   No results for input(s): AMMONIA in the last 168 hours.  ABG No results found for: PHART, PCO2ART, PO2ART, HCO3, TCO2, ACIDBASEDEF, O2SAT   Coagulation Profile: No results for input(s): INR, PROTIME in the last 168 hours.  Cardiac Enzymes: No results for input(s): CKTOTAL, CKMB, CKMBINDEX, TROPONINI in the last 168 hours.  HbA1C: Hgb A1c MFr Bld  Date/Time Value Ref Range Status  06/12/2010 04:00 AM (H) <5.7 % Final   5.8 (NOTE)  According to the ADA Clinical Practice Recommendations for 2011, when HbA1c is used as a screening test:   >=6.5%   Diagnostic of Diabetes Mellitus           (if abnormal result  is confirmed)  5.7-6.4%   Increased risk of developing Diabetes Mellitus  References:Diagnosis and Classification of Diabetes Mellitus,Diabetes Care,2011,34(Suppl 1):S62-S69 and Standards of Medical Care in         Diabetes - 2011,Diabetes [id number]  (Suppl 1):S11-S61.  06/11/2010 06:36 PM  <5.7 % Final   5.6 (NOTE)                                                                       According to the ADA Clinical Practice Recommendations for 2011, when HbA1c is used as a screening test:   >=6.5%   Diagnostic of Diabetes Mellitus           (if abnormal result  is confirmed)  5.7-6.4%   Increased risk of developing Diabetes Mellitus  References:Diagnosis and Classification of Diabetes  [place],Diabetes [id number] 1):S62-S69 and Standards of Medical Care in         Diabetes - 2011,Diabetes [id number]  (Suppl 1):S11-S61.    CBG: No results for input(s): GLUCAP in the last 168 hours.  Review of Systems:   Marland KitchenMarland KitchenReview of Systems  Constitutional: Negative for chills, diaphoresis and fever.  HENT: Negative.   Respiratory: Positive for cough. Negative for hemoptysis, shortness of breath and wheezing.   Cardiovascular: Positive for leg swelling. Negative for chest pain and palpitations.  Gastrointestinal: Positive for abdominal pain.  Genitourinary: Negative.   Musculoskeletal: Positive for neck pain.  Skin: Negative for rash.  Neurological: Negative for focal weakness, loss of consciousness and headaches.  Endo/Heme/Allergies: Negative.   Psychiatric/Behavioral: Negative.      Past Medical History  She,  has a past medical history of Blind left eye, Cataract of both eyes (01/18/2009), COPD with emphysema ([place]) (02/28/2009), Depression (06/08/2007), Essential hypertension, benign (06/08/2007), GERD (01/18/2009), Osteoporosis (12/31/2012), Recurrent dislocation of hip (06/28/2010), and Sarcoidosis (02/05/2009).   Surgical History    Past Surgical History:  Procedure Laterality Date   CATARACT EXTRACTION Right    COLONOSCOPY  2002   HIP ARTHROPLASTY Right    TUBAL LIGATION       Social History   reports that she quit smoking about 9 years ago. She has a 7.50 pack-year smoking history. She has never used smokeless tobacco. She reports current alcohol use. She reports that she does not use drugs.   Family History   Her family history includes Asthma in her daughter; Breast cancer in her mother; Cancer in her maternal aunt, mother, sister, and another family member; Diabetes in an other family member; Heart disease in her mother and sister; Hypertension in an other family member; Stroke in her father. There is no history of Colon cancer.   Allergies Allergies   Allergen Reactions   Ace Inhibitors Swelling   Lisinopril Swelling    REACTION: Angioedema   Valsartan Swelling    REACTION: angioedema.  Pt should not get ACEI or ARBs of any kind due to angioedema.   Aspirin Other (See Comments)    REACTION: GI ulcer.   Penicillins     Childhood  allergy Has patient had a PCN reaction causing immediate rash, facial/tongue/throat swelling, SOB or lightheadedness with hypotension: Unknown Has patient had a PCN reaction causing severe rash involving mucus membranes or skin necrosis: Unknown Has patient had a PCN reaction that required hospitalization: Unknown Has patient had a PCN reaction occurring within the last 10 years: Unknown If all of the above answers are "NO", then may proceed with Cephalosporin use.      Home Medications  Prior to Admission medications   Medication Sig Start Date End Date Taking? Authorizing Provider  acetaminophen (TYLENOL) 325 MG tablet Take 650 mg by mouth as needed.    [provider]  albuterol (PROVENTIL) (2.5 MG/3ML) 0.083% nebulizer solution Take 3 mLs (2.5 mg total) by nebulization every 6 (six) hours as needed. As needed for SOB 04/15/19   Isaac Bliss, Rayford Halsted, MD  albuterol (VENTOLIN HFA) 108 (90 Base) MCG/ACT inhaler Inhale 1-2 puffs into the lungs every 6 (six) hours as needed for wheezing or shortness of breath. 04/15/19 04/14/20  Isaac Bliss, Rayford Halsted, MD  amLODipine (NORVASC) 5 MG tablet Take 1 tablet (5 mg total) by mouth daily. 02/22/19 02/22/20  Erline Hau, MD  calcium-vitamin D (OS-CAL 500 + D) 500-200 MG-UNIT tablet Take 1 tablet by mouth 2 (two) times daily. Patient not taking: Reported on 10/20/2018 05/24/18   Katherine Roan, MD  ipratropium-albuterol (DUONEB) 0.5-2.5 (3) MG/3ML SOLN USE 3 ML VIA NEBULIZER EVERY 4 HOURS AS NEEDED 06/18/18   Sherrilyn Rist A, MD  metoprolol succinate (TOPROL-XL) 50 MG 24 hr tablet Take with or immediately following a meal. 02/22/19    Isaac Bliss, Rayford Halsted, MD  mirtazapine (REMERON) 15 MG tablet Take 1 tablet (15 mg total) by mouth at bedtime. 03/01/18   Katherine Roan, MD  pantoprazole (PROTONIX) 40 MG tablet Take 1 tablet (40 mg total) by mouth daily. 02/22/19   Isaac Bliss, Rayford Halsted, MD  polyethylene glycol [facility]) packet Take 17 g by mouth daily. 05/24/18   Katherine Roan, MD  pravastatin (PRAVACHOL) 40 MG tablet TAKE 1 TABLET BY MOUTH EVERY DAY IN THE EVENING 02/22/19   Isaac Bliss, Rayford Halsted, MD  STAFF NOTE  I, Dr Seward Carol have personally reviewed patient's available data, including medical history, events of note, physical examination and test results as part of my evaluation. I have discussed with other care providers such as pharmacist, and  RN.  In addition,  I personally evaluated patient  The patient is critically ill with multiple organ systems failure and requires high complexity decision making for assessment and support, frequent evaluation and titration of therapies, application of advanced monitoring technologies and extensive interpretation of multiple databases.   Critical Care Time devoted to patient care services described in this note is 76  Minutes. This time reflects time of care of this signee Dr Seward Carol. This critical care time does not reflect procedure time, or teaching time or supervisory time but could involve care discussion time   CC TIME: 55 minutes CODE STATUS: FULL CODE DISPOSITION:ICU PROGNOSIS: guarded  Dr. Seward Carol Pulmonary Critical Care Medicine  05/31/2019 9:46 PM       Critical care time: 55 mins

## 2019-05-31 NOTE — ED Notes (Addendum)
MD notified levophed drip titrated to max of 10, map is within parameters however SBP not in parameters  Instructed to keep levophed at 10 and critical care was to evaluate.

## 2019-05-31 NOTE — ED Triage Notes (Signed)
Patient arrives [facility] from home for generalized weakness, AMS, hypotension, abdominal pain, and bilateral leg swelling. Per pt family, pt has been declining over the past few days. Pt is alert and oriented x 4.

## 2019-06-01 ENCOUNTER — Inpatient Hospital Stay (HOSPITAL_COMMUNITY): Payer: Medicare Other

## 2019-06-01 DIAGNOSIS — Z789 Other specified health status: Secondary | ICD-10-CM

## 2019-06-01 DIAGNOSIS — G9340 Encephalopathy, unspecified: Secondary | ICD-10-CM

## 2019-06-01 DIAGNOSIS — I361 Nonrheumatic tricuspid (valve) insufficiency: Secondary | ICD-10-CM

## 2019-06-01 DIAGNOSIS — E871 Hypo-osmolality and hyponatremia: Secondary | ICD-10-CM

## 2019-06-01 DIAGNOSIS — G9341 Metabolic encephalopathy: Secondary | ICD-10-CM

## 2019-06-01 DIAGNOSIS — E162 Hypoglycemia, unspecified: Secondary | ICD-10-CM

## 2019-06-01 DIAGNOSIS — R9431 Abnormal electrocardiogram [ECG] [EKG]: Secondary | ICD-10-CM

## 2019-06-01 DIAGNOSIS — N179 Acute kidney failure, unspecified: Secondary | ICD-10-CM

## 2019-06-01 DIAGNOSIS — I272 Pulmonary hypertension, unspecified: Secondary | ICD-10-CM

## 2019-06-01 DIAGNOSIS — R579 Shock, unspecified: Secondary | ICD-10-CM

## 2019-06-01 LAB — RESPIRATORY PANEL BY PCR

## 2019-06-01 LAB — URINALYSIS, ROUTINE W REFLEX MICROSCOPIC
Glucose, UA: NEGATIVE mg/dL
Hgb urine dipstick: NEGATIVE
Ketones, ur: NEGATIVE mg/dL
Nitrite: NEGATIVE
Protein, ur: NEGATIVE mg/dL
Specific Gravity, Urine: 1.017 (ref 1.005–1.030)
pH: 5 (ref 5.0–8.0)

## 2019-06-01 LAB — BASIC METABOLIC PANEL
Anion gap: 9 (ref 5–15)
BUN: 22 mg/dL (ref 8–23)
CO2: 19 mmol/L — ABNORMAL LOW (ref 22–32)
Calcium: 5.5 mg/dL — CL (ref 8.9–10.3)
Chloride: 104 mmol/L (ref 98–111)
Creatinine, Ser: 1.09 mg/dL — ABNORMAL HIGH (ref 0.44–1.00)
GFR calc Af Amer: 58 mL/min — ABNORMAL LOW (ref >60)
GFR calc non Af Amer: 50 mL/min — ABNORMAL LOW (ref >60)
Glucose, Bld: 98 mg/dL (ref 70–99)
Potassium: 3 mmol/L — ABNORMAL LOW (ref 3.5–5.1)
Sodium: 132 mmol/L — ABNORMAL LOW (ref 135–145)

## 2019-06-01 LAB — COMPREHENSIVE METABOLIC PANEL
ALT: 23 U/L (ref 0–44)
AST: 48 U/L — ABNORMAL HIGH (ref 15–41)
Albumin: 2.1 g/dL — ABNORMAL LOW (ref 3.5–5.0)
Alkaline Phosphatase: 137 U/L — ABNORMAL HIGH (ref 38–126)
Anion gap: 10 (ref 5–15)
BUN: 31 mg/dL — ABNORMAL HIGH (ref 8–23)
CO2: 25 mmol/L (ref 22–32)
Calcium: 7.1 mg/dL — ABNORMAL LOW (ref 8.9–10.3)
Chloride: 90 mmol/L — ABNORMAL LOW (ref 98–111)
Creatinine, Ser: 1.82 mg/dL — ABNORMAL HIGH (ref 0.44–1.00)
GFR calc Af Amer: 31 mL/min — ABNORMAL LOW (ref >60)
GFR calc non Af Amer: 27 mL/min — ABNORMAL LOW (ref >60)
Glucose, Bld: 90 mg/dL (ref 70–99)
Potassium: 4 mmol/L (ref 3.5–5.1)
Sodium: 125 mmol/L — ABNORMAL LOW (ref 135–145)
Total Bilirubin: 1.5 mg/dL — ABNORMAL HIGH (ref 0.3–1.2)
Total Protein: 5.2 g/dL — ABNORMAL LOW (ref 6.5–8.1)

## 2019-06-01 LAB — CBC
HCT: 29.7 % — ABNORMAL LOW (ref 36.0–46.0)
Hemoglobin: 10.4 g/dL — ABNORMAL LOW (ref 12.0–15.0)
MCH: 29 pg (ref 26.0–34.0)
MCHC: 35 g/dL (ref 30.0–36.0)
MCV: 82.7 fL (ref 80.0–100.0)
Platelets: 176 10*3/uL (ref 150–400)
RBC: 3.59 MIL/uL — ABNORMAL LOW (ref 3.87–5.11)
RDW: 15.9 % — ABNORMAL HIGH (ref 11.5–15.5)
WBC: 16.1 10*3/uL — ABNORMAL HIGH (ref 4.0–10.5)
nRBC: 0 % (ref 0.0–0.2)

## 2019-06-01 LAB — CORTISOL: Cortisol, Plasma: 26.2 ug/dL

## 2019-06-01 LAB — URINE CULTURE

## 2019-06-01 LAB — LACTIC ACID, PLASMA: Lactic Acid, Venous: 3.1 mmol/L (ref 0.5–1.9)

## 2019-06-01 LAB — PROCALCITONIN: Procalcitonin: 8.64 ng/mL

## 2019-06-01 LAB — ECHOCARDIOGRAM COMPLETE
Height: 65 in
Weight: 1890.66 oz

## 2019-06-01 LAB — MRSA PCR SCREENING: MRSA by PCR: NEGATIVE

## 2019-06-01 LAB — PHOSPHORUS: Phosphorus: 3.9 mg/dL (ref 2.5–4.6)

## 2019-06-01 LAB — MAGNESIUM: Magnesium: 3.5 mg/dL — ABNORMAL HIGH (ref 1.7–2.4)

## 2019-06-01 MED ORDER — VANCOMYCIN HCL IN DEXTROSE 750-5 MG/150ML-% IV SOLN: 750.0000 mg | INTRAVENOUS | Status: DC

## 2019-06-01 MED ORDER — MUSCLE RUB 10-15 % EX CREA
1.0000 "application " | TOPICAL_CREAM | CUTANEOUS | Status: DC | PRN
  Filled 2019-06-01: qty 85

## 2019-06-01 MED ORDER — SODIUM CHLORIDE 0.9% FLUSH: 10.0000 mL | INTRAVENOUS | Status: DC | PRN

## 2019-06-01 MED ORDER — GUAIFENESIN-DM 100-10 MG/5ML PO SYRP
5.0000 mL | ORAL_SOLUTION | ORAL | Status: DC | PRN
  Administered 2019-06-01 – 2019-06-05 (×5): 5 mL via ORAL
  Filled 2019-06-01 (×6): qty 10

## 2019-06-01 MED ORDER — POLYVINYL ALCOHOL 1.4 % OP SOLN
1.0000 [drp] | OPHTHALMIC | Status: DC | PRN
  Administered 2019-06-03 – 2019-06-06 (×3): 1 [drp] via OPHTHALMIC
  Filled 2019-06-01: qty 15

## 2019-06-01 MED ORDER — LIP MEDEX EX OINT
1.0000 "application " | TOPICAL_OINTMENT | CUTANEOUS | Status: DC | PRN
  Administered 2019-06-01 – 2019-06-06 (×2): 1 via TOPICAL
  Filled 2019-06-01: qty 7

## 2019-06-01 MED ORDER — HYDROCORTISONE 1 % EX CREA
1.0000 "application " | TOPICAL_CREAM | Freq: Three times a day (TID) | CUTANEOUS | Status: DC | PRN
  Filled 2019-06-01: qty 28

## 2019-06-01 MED ORDER — METHYLPREDNISOLONE SODIUM SUCC 40 MG IJ SOLR
40.0000 mg | Freq: Two times a day (BID) | INTRAMUSCULAR | Status: DC
  Administered 2019-06-01 – 2019-06-03 (×5): 40 mg via INTRAVENOUS
  Filled 2019-06-01 (×5): qty 1

## 2019-06-01 MED ORDER — PHENOL 1.4 % MT LIQD
1.0000 | OROMUCOSAL | Status: DC | PRN
  Filled 2019-06-01 (×2): qty 177

## 2019-06-01 MED ORDER — SODIUM CHLORIDE 0.9% FLUSH
10.0000 mL | Freq: Two times a day (BID) | INTRAVENOUS | Status: DC
  Administered 2019-06-01 – 2019-06-07 (×13): 10 mL

## 2019-06-01 MED ORDER — SALINE SPRAY 0.65 % NA SOLN
1.0000 | NASAL | Status: DC | PRN
  Filled 2019-06-01: qty 44

## 2019-06-01 MED ORDER — POTASSIUM CHLORIDE 20 MEQ PO PACK
40.0000 meq | PACK | ORAL | Status: AC
  Administered 2019-06-01 (×2): 40 meq via ORAL
  Filled 2019-06-01 (×3): qty 2

## 2019-06-01 MED ORDER — SODIUM CHLORIDE 0.9 % IV SOLN
2.0000 g | Freq: Every day | INTRAVENOUS | Status: DC
  Administered 2019-06-01 (×2): 2 g via INTRAVENOUS
  Filled 2019-06-01 (×2): qty 2

## 2019-06-01 NOTE — Progress Notes (Signed)
NAME:  [name], MRN:  [id number], DOB:  05/17/1946, LOS: 1 ADMISSION DATE:  05/31/2019, CONSULTATION DATE:  05/31/2019 REFERRING MD:  Julianne Rice, CHIEF COMPLAINT:  AMS and Leg Swelling   Brief History   73 yr old F w/ PMHx Sarcoidosis, Pulm HTN, admitted 10/20 with reports of generalized weakness, BLE swelling, diarrhea and AMS. Afebrile, hypotensive on presentation.  Given 30cc/kg in ED for Sepsis LA 4.3. Started on Levophed when pt did not improve. PCCM asked to admit  Past Medical History  Sarcoidosis  COPD GERD HTN Cataracts Bilaterally, Blind in L eye Osteoporosis  Right Hip Arthroplasty   Significant Hospital Events   10/20 Admit with weakness, swelling, diarrhea, AMS.  Hypotensive, on levophed  Consults:  10/20 PCCM  Procedures:     Significant Diagnostic Tests:   CT Chest w Contrast 11/209 >> pulmonary parenchymal changes compatible with fibrosis, similar-appearing extensive calcified mediastinal hilar adenopathy, subpleural nodular opacities in the LLL, aortic atherosclerosis   ECHO 2011 >> LVEF 0000000, grade 1 diastolic dysfunction, PA systolic A999333 mmHg, RA 99991111 mmHg  CXR 10/20 >> diffuse coarse interstitial opacities bilaterally with scarring and fibrotic changes   Micro Data:  SARSCOV2 10/20 >> negative RVP 10/20 >> negative GI Path PCR 10/20 >>  UC 10/20 >>  Sputum 10/20 >>  BCx2 10/20 >>    Antimicrobials:  Vanco 10/20 >>  Levaquin 10/20>>      Subjective   RN reports pt remains on levophed at 40mcg's.  Afebrile.  No acute events overnight.   Objective   Blood pressure 109/62, pulse (!) 107, temperature 98.7 F (37.1 C), resp. rate 18, height 5\' 5"  (1.651 m), weight 53.6 kg, SpO2 94 %. CVP:  [9 mmHg] 9 mmHg      Intake/Output Summary (Last 24 hours) at 06/01/2019 0916 Last data filed at 06/01/2019 0600 Gross per 24 hour  Intake 1956.16 ml  Output 350 ml  Net 1606.16 ml   Filed Weights   06/01/19 0138  Weight: 53.6  kg    Examination: General: frail elderly female lying in bed in NAD HEENT: MM pink/moist, no jvd, poor dentition  Neuro: Awake, alert, pleasant, speech clear, MAE CV: s1s2 rrr, no m/r/g PULM:  Even/non-labored on RA, lungs bilaterally clear GI: soft, bsx4 active  Extremities: warm/dry, BLE 2+ pitting ankle edema  Skin: no rashes or lesions  Assessment & Plan:   Shock:  Right sided heart failure vs Sepsis ECHO in 2011 with pulmonary HTN, BLE swelling, elevated LA/BNP, abdominal discomfort, hyponatremia and pre-syncopal concerning for right heart failureGiven h/o Pulmonary HTN on ECHO in 2011.  Elevated PCT raises concern for infectious process.  P: -monitor in ICU -await ECHO  -Cardiology consulted, appreciate input  -Levophed for MAP > 65 -await GI PCR   Sepsis? Hx diarrhea (pt on Miralax at home and has a h/o IBS), hypotension and elevated LA, increasing opacity in the right perihilar region and left lung base- ? Pneumonia.  Was on Doxycycline at home.  Did not respond to 71ml/kg IVF bolus.  P: -follow cultures -continue empiric abx  -trend PCT  -levophed for MAP >65   Sarcoidosis, COPD with emphysema PFTs 03/30/2009: FVC 67%, FEV1 48%, FEV1/FVC 52%, TLC 104%, DLCO 71%. No consistent Pulmonary follow up- last seen 06/2018>>On that visit she had weight loss, cough and SOB Home meds include Duonebs, Pro Air and prednisone 20 mg BID (pt reports she was only taking once daily), doxycycline. Imaging shows end stage fibrotic changes P: -  continue duoneb Q6 -solumedrol 40 mg BID  Hypoglycemic Low Albumin >> poor nutritional status P: -diet as tolerated  -follow CBG's  -cortisol 26.2  Hyponatremia Poor PO intake, ?CHF contribution.  Clinically appears hypovolemic (dry MM) despite pedal edema  P: -Follow Na -Clear liquid diet as tolerated   Acute Kidney Injury P: -Trend BMP / urinary output -Replace electrolytes as indicated -Avoid nephrotoxic agents, ensure adequate  renal perfusion  Acute Encephalopathy Slurred words, h/o fall and presyncopal episodes May be secondary to hypotension Pt currently Alert awake and oriented to person and place P: -follow neuro exam  -fall precautions    Best practice:  Diet: liquids Pain/Anxiety/Delirium protocol (if indicated): not needed no c/o pain VAP protocol (if indicated): not intubated DVT prophylaxis: Hep SQ w/ SCDs GI prophylaxis: Protonix Glucose control: hypoglycemic on admission Mobility: Bedrest Code Status: Full Family Communication: Daughter [name] [phone] (home)   [phone] (cell phone) updated 10/21 early am.  Per prior discussion > Intubation, vasopressors and aggressive interventions reviwed. The daughter expressed that she did not want her mother to suffer but she did want reversible issues to be addressed.  Pt will remain a full code at this time.  Disposition: ICU  Labs   CBC: Recent Labs  Lab 05/31/19 1821 06/01/19 0523  WBC 9.2 16.1*  NEUTROABS 7.8*  --   HGB 10.0* 10.4*  HCT 30.0* 29.7*  MCV 86.5 82.7  PLT 146* 0000000    Basic Metabolic Panel: Recent Labs  Lab 05/31/19 1821 06/01/19 0523  NA 125* 125*  K 4.7 4.0  CL 89* 90*  CO2 25 25  GLUCOSE 56* 90  BUN 32* 31*  CREATININE 2.62* 1.82*  CALCIUM 7.3* 7.1*  MG  --  3.5*  PHOS  --  3.9   GFR: Estimated Creatinine Clearance: 23.3 mL/min (A) (by C-G formula based on SCr of 1.82 mg/dL (H)). Recent Labs  Lab 05/31/19 1819 05/31/19 1821 06/01/19 0035 06/01/19 0523  PROCALCITON  --   --  8.64  --   WBC  --  9.2  --  16.1*  LATICACIDVEN 4.3*  --  3.1*  --     Liver Function Tests: Recent Labs  Lab 05/31/19 1821 06/01/19 0523  AST 38 48*  ALT 19 23  ALKPHOS 134* 137*  BILITOT 1.1 1.5*  PROT 4.8* 5.2*  ALBUMIN 2.0* 2.1*   Recent Labs  Lab 05/31/19 1821  LIPASE 13   No results for input(s): AMMONIA in the last 168 hours.  ABG    Component Value Date/Time   O2SAT 62.3 05/31/2019 2340      Coagulation Profile: No results for input(s): INR, PROTIME in the last 168 hours.  Cardiac Enzymes: No results for input(s): CKTOTAL, CKMB, CKMBINDEX, TROPONINI in the last 168 hours.  HbA1C: Hgb A1c MFr Bld  Date/Time Value Ref Range Status  06/12/2010 04:00 AM (H) <5.7 % Final   5.8 (NOTE)                                                                       According to the ADA Clinical Practice Recommendations for 2011, when HbA1c is used as a screening test:   >=6.5%   Diagnostic of Diabetes Mellitus           (  if abnormal result  is confirmed)  5.7-6.4%   Increased risk of developing Diabetes Mellitus  References:Diagnosis and Classification of Diabetes Mellitus,Diabetes Care,2011,34(Suppl 1):S62-S69 and Standards of Medical Care in         Diabetes - 2011,Diabetes [id number]  (Suppl 1):S11-S61.  06/11/2010 06:36 PM  <5.7 % Final   5.6 (NOTE)                                                                       According to the ADA Clinical Practice Recommendations for 2011, when HbA1c is used as a screening test:   >=6.5%   Diagnostic of Diabetes Mellitus           (if abnormal result  is confirmed)  5.7-6.4%   Increased risk of developing Diabetes Mellitus  References:Diagnosis and Classification of Diabetes Mellitus,Diabetes [id number] 1):S62-S69 and Standards of Medical Care in         Diabetes - 2011,Diabetes [id number]  (Suppl 1):S11-S61.    CBG: No results for input(s): GLUCAP in the last 168 hours.   Critical care time: 30 minutes    Noe Gens, NP-C [facility] 06/01/2019, 9:17 AM

## 2019-06-01 NOTE — Progress Notes (Signed)
CRITICAL VALUE ALERT  Critical Value: Calcium 5.5  Date & Time Notied:  06/01/2019 1615  Provider Notified: Dr. Lamonte Sakai  Orders Received/Actions taken: Awaiting new orders

## 2019-06-01 NOTE — Progress Notes (Signed)
eLink Physician-Brief Progress Note Patient Name: [name] DOB: Sep 29, 1945 MRN: [id number]   Date of Service  06/01/2019  HPI/Events of Note  Pt needs arterial line order as documented in notes.  eICU Interventions  Arterial line order placed.        [name] 06/01/2019, 2:14 AM

## 2019-06-01 NOTE — Procedures (Signed)
Central Venous Catheter Insertion Procedure Note ILZE GERDEMAN [id number] 06/16/1946  Procedure: Insertion of Central Venous Catheter Indications: Assessment of intravascular volume, Drug and/or fluid administration and Frequent blood sampling  Procedure Details Consent: Risks of procedure as well as the alternatives and risks of each were explained to the (patient/caregiver).  Consent for procedure obtained. Time Out: Verified patient identification, verified procedure, site/side was marked, verified correct patient position, special equipment/implants available, medications/allergies/relevent history reviewed, required imaging and test results available.  Performed  Maximum sterile technique was used including antiseptics, cap, gloves, gown, hand hygiene, mask and sheet. Skin prep: Chlorhexidine; local anesthetic administered A antimicrobial bonded/coated triple lumen catheter was placed in the right internal jugular vein using the Seldinger technique.  Evaluation Blood flow good Complications: No apparent complications Patient did tolerate procedure well. Chest X-ray ordered to verify placement.  CXR: normal.  Lateefah Mallery D Jorje Vanatta 06/01/2019, 12:19 AM

## 2019-06-01 NOTE — Progress Notes (Signed)
[facility] Progress Note Patient Name: [name] DOB: [birth year] MRN: [id number]   Date of Service  06/01/2019  HPI/Events of Note  RN requesting nursing orders for prn  Medication Rx  eICU Interventions  Nursing prn medications RX order entered.        [name] 06/01/2019, 10:17 PM

## 2019-06-01 NOTE — Progress Notes (Signed)
  Echocardiogram 2D Echocardiogram has been performed.  [name] 06/01/2019, 11:24 AM

## 2019-06-01 NOTE — Procedures (Signed)
Arterial Catheter Insertion Procedure Note [name] [id number] November 28, 1945  Procedure: Insertion of Arterial Catheter  Indications: Blood pressure monitoring and Frequent blood sampling  Procedure Details Consent: Risks of procedure as well as the alternatives and risks of each were explained to the (patient/caregiver).  Consent for procedure obtained. Time Out: Verified patient identification, verified procedure, site/side was marked, verified correct patient position, special equipment/implants available, medications/allergies/relevent history reviewed, required imaging and test results available.  Performed  Maximum sterile technique was used including antiseptics, cap, gloves, gown, hand hygiene, mask and sheet. Skin prep: Chlorhexidine; local anesthetic administered 22 gauge catheter was inserted into right radial artery using the Seldinger technique. ULTRASOUND GUIDANCE USED: YES Evaluation Blood flow good; BP tracing good. Complications: No apparent complications.   Winferd Humphrey 06/01/2019

## 2019-06-01 NOTE — Progress Notes (Signed)
Pharmacy Antibiotic Note  [name] is a 73 y.o. female admitted on 05/31/2019 with sepsis.  Pharmacy has been consulted for Vancomycin, cefepime dosing.  Plan: Vancomycin 1gm iv x1, then Vancomycin 750 mg IV Q 48 hrs. Goal AUC 400-550. Expected AUC: 520 SCr used: 2.62  Cefepime 2gm iv x1, then 2gm iv q24hr  Levofloxacin 750mg  iv x1  Height: 5\' 5"  (165.1 cm) Weight: 118 lb 2.7 oz (53.6 kg) IBW/kg (Calculated) : 57  Temp (24hrs), Avg:98 F (36.7 C), Min:98 F (36.7 C), Max:98 F (36.7 C)  Recent Labs  Lab 05/31/19 1819 05/31/19 1821 06/01/19 0035  WBC  --  9.2  --   CREATININE  --  2.62*  --   LATICACIDVEN 4.3*  --  3.1*    Estimated Creatinine Clearance: 16.2 mL/min (A) (by C-G formula based on SCr of 2.62 mg/dL (H)).    Allergies  Allergen Reactions  . Ace Inhibitors Swelling  . Lisinopril Swelling    REACTION: Angioedema  . Valsartan Swelling    REACTION: angioedema.  Pt should not get ACEI or ARBs of any kind due to angioedema.  . Aspirin Other (See Comments)    REACTION: GI ulcer.  . Penicillins     Childhood allergy Has patient had a PCN reaction causing immediate rash, facial/tongue/throat swelling, SOB or lightheadedness with hypotension: Unknown Has patient had a PCN reaction causing severe rash involving mucus membranes or skin necrosis: Unknown Has patient had a PCN reaction that required hospitalization: Unknown Has patient had a PCN reaction occurring within the last 10 years: Unknown If all of the above answers are "NO", then may proceed with Cephalosporin use.     Antimicrobials this admission: Vancomycin 05/31/2019 >> Cefepime 06/01/2019 >>  Levofloxacin 05/31/2019 x1  Dose adjustments this admission: -  Microbiology results: -  Thank you for allowing pharmacy to be a part of this patient's care.  Nani Skillern Crowford 06/01/2019 2:45 AM

## 2019-06-02 DIAGNOSIS — J9601 Acute respiratory failure with hypoxia: Secondary | ICD-10-CM

## 2019-06-02 LAB — BASIC METABOLIC PANEL
Anion gap: 9 (ref 5–15)
BUN: 25 mg/dL — ABNORMAL HIGH (ref 8–23)
CO2: 26 mmol/L (ref 22–32)
Calcium: 7.4 mg/dL — ABNORMAL LOW (ref 8.9–10.3)
Chloride: 97 mmol/L — ABNORMAL LOW (ref 98–111)
Creatinine, Ser: 1.2 mg/dL — ABNORMAL HIGH (ref 0.44–1.00)
GFR calc Af Amer: 52 mL/min — ABNORMAL LOW (ref >60)
GFR calc non Af Amer: 45 mL/min — ABNORMAL LOW (ref >60)
Glucose, Bld: 139 mg/dL — ABNORMAL HIGH (ref 70–99)
Potassium: 5 mmol/L (ref 3.5–5.1)
Sodium: 132 mmol/L — ABNORMAL LOW (ref 135–145)

## 2019-06-02 LAB — MAGNESIUM: Magnesium: 3 mg/dL — ABNORMAL HIGH (ref 1.7–2.4)

## 2019-06-02 LAB — CBC
HCT: 30.3 % — ABNORMAL LOW (ref 36.0–46.0)
Hemoglobin: 10.7 g/dL — ABNORMAL LOW (ref 12.0–15.0)
MCH: 28.9 pg (ref 26.0–34.0)
MCHC: 35.3 g/dL (ref 30.0–36.0)
MCV: 81.9 fL (ref 80.0–100.0)
Platelets: 189 10*3/uL (ref 150–400)
RBC: 3.7 MIL/uL — ABNORMAL LOW (ref 3.87–5.11)
RDW: 15.7 % — ABNORMAL HIGH (ref 11.5–15.5)
WBC: 13.7 10*3/uL — ABNORMAL HIGH (ref 4.0–10.5)
nRBC: 0 % (ref 0.0–0.2)

## 2019-06-02 LAB — PROCALCITONIN: Procalcitonin: 5.79 ng/mL

## 2019-06-02 MED ORDER — ALBUMIN HUMAN 25 % IV SOLN
12.5000 g | Freq: Once | INTRAVENOUS | Status: AC
  Administered 2019-06-02: 11:00:00 12.5 g via INTRAVENOUS
  Filled 2019-06-02: qty 50

## 2019-06-02 MED ORDER — VANCOMYCIN HCL IN DEXTROSE 750-5 MG/150ML-% IV SOLN
750.0000 mg | INTRAVENOUS | Status: DC
  Administered 2019-06-02: 12:00:00 750 mg via INTRAVENOUS
  Filled 2019-06-02 (×2): qty 150

## 2019-06-02 MED ORDER — SODIUM CHLORIDE 0.9 % IV SOLN
2.0000 g | Freq: Two times a day (BID) | INTRAVENOUS | Status: AC
  Administered 2019-06-02 – 2019-06-06 (×10): 2 g via INTRAVENOUS
  Filled 2019-06-02 (×10): qty 2

## 2019-06-02 NOTE — Progress Notes (Signed)
Pharmacy Antibiotic Note  [name] is a 73 y.o. female admitted on 05/31/2019 with sepsis.  Pharmacy has been consulted for Vancomycin, cefepime dosing.  06/02/2019 Scr 1.2, CrCl ~ 37.27mls/min WBC 13.7 afebrile  Plan: For improved renal function Increase Vancomycin 750 mg IV to q24h (AUC 487.1) Increase Cefepime 2gm IV to q12h Follow renal function, cultures and clinical course    Height: 5\' 5"  (165.1 cm) Weight: 132 lb 7.9 oz (60.1 kg) IBW/kg (Calculated) : 57  Temp (24hrs), Avg:98.1 F (36.7 C), Min:97.4 F (36.3 C), Max:98.5 F (36.9 C)  Recent Labs  Lab 05/31/19 1819 05/31/19 1821 06/01/19 0035 06/01/19 0523 06/01/19 1530 06/02/19 0500  WBC  --  9.2  --  16.1*  --  13.7*  CREATININE  --  2.62*  --  1.82* 1.09* 1.20*  LATICACIDVEN 4.3*  --  3.1*  --   --   --     Estimated Creatinine Clearance: 37.6 mL/min (A) (by C-G formula based on SCr of 1.2 mg/dL (H)).    Allergies  Allergen Reactions  . Ace Inhibitors Swelling  . Lisinopril Swelling    REACTION: Angioedema  . Valsartan Swelling    REACTION: angioedema.  Pt should not get ACEI or ARBs of any kind due to angioedema.  . Aspirin Other (See Comments)    REACTION: GI ulcer.  . Penicillins     Childhood allergy Has patient had a PCN reaction causing immediate rash, facial/tongue/throat swelling, SOB or lightheadedness with hypotension: Unknown Has patient had a PCN reaction causing severe rash involving mucus membranes or skin necrosis: Unknown Has patient had a PCN reaction that required hospitalization: Unknown Has patient had a PCN reaction occurring within the last 10 years: Unknown If all of the above answers are "NO", then may proceed with Cephalosporin use.     Antimicrobials this admission: Vancomycin 05/31/2019 >> Cefepime 06/01/2019 >>  Levofloxacin 05/31/2019 x1  Dose adjustments this admission: -  Microbiology results: 10/21 BCx: ngtd 10/21 UCx: multiple species present,  suggest recollection  Thank you for allowing pharmacy to be a part of this patient's care.  Dolly Rias RPh 06/02/2019, 8:16 AM Pager [phone]

## 2019-06-02 NOTE — Progress Notes (Signed)
NAME:  [name], MRN:  [id number], DOB:  April 23, 1946, LOS: 2 ADMISSION DATE:  05/31/2019, CONSULTATION DATE:  05/31/2019 REFERRING MD:  Julianne Rice, CHIEF COMPLAINT:  AMS and Leg Swelling   Brief History   73 yr old F w/ PMHx Sarcoidosis, Pulm HTN, admitted 10/20 with reports of generalized weakness, BLE swelling, diarrhea and AMS. Afebrile, hypotensive on presentation.  Given 30cc/kg in ED for Sepsis LA 4.3. Started on Levophed when pt did not improve. PCCM asked to admit.  Concern for sepsis vs right heart failure.   Past Medical History  Sarcoidosis  COPD GERD HTN Cataracts Bilaterally, Blind in L eye Osteoporosis  Right Hip Arthroplasty  Prior ETOH Abuse  Significant Hospital Events   10/20 Admit with weakness, swelling, diarrhea, AMS.  Hypotensive, on levophed  Consults:  10/20 PCCM  Procedures:  R IJ TLC 10/21 >>   Significant Diagnostic Tests:   CT Chest w Contrast 11/209 >> pulmonary parenchymal changes compatible with fibrosis, similar-appearing extensive calcified mediastinal hilar adenopathy, subpleural nodular opacities in the LLL, aortic atherosclerosis   ECHO 2011 >> LVEF 0000000, grade 1 diastolic dysfunction, PA systolic A999333 mmHg, RA 99991111 mmHg  CXR 10/20 >> diffuse coarse interstitial opacities bilaterally with scarring and fibrotic changes  ECHO 10/21 >> LVEF 55-60%, indeterminate diastolic filling, global RV with mildly reduced systolic function, LA normal, RA mildly dilated, trace MVR, severe TVR, moderately elevated pulmonary artery pressure   Micro Data:  SARSCOV2 10/20 >> negative RVP 10/20 >> negative GI Path PCR 10/20 >>  UC 10/20 >> multiple species, re-collect  Sputum 10/20 >>  BCx2 10/20 >>    Antimicrobials:  Vanco 10/20 >>  Levaquin 10/20 >> 10/21  Cefepime 10/21 >>   Subjective   RN reports aline not correlating with BP cuff. Aline wave form abnormal.  Remains on 46mcg's levophed.    Objective   Blood pressure  132/65, pulse (!) 105, temperature 98.5 F (36.9 C), temperature source Oral, resp. rate 16, height 5\' 5"  (1.651 m), weight 60.1 kg, SpO2 100 %. CVP:  [6 mmHg] 6 mmHg      Intake/Output Summary (Last 24 hours) at 06/02/2019 0751 Last data filed at 06/02/2019 0600 Gross per 24 hour  Intake 986.34 ml  Output 1450 ml  Net -463.66 ml   Filed Weights   06/01/19 0138 06/02/19 0500  Weight: 53.6 kg 60.1 kg    Examination: General: thin elderly female lying in bed in NAD HEENT: MM pink/dry, pupils =/reactive, cartacts  Neuro: Awake, alert, oriented, speech clear, MAE / generalized weakness  CV: s1s2 RRR, no m/r/g PULM:  Even/non-labored, lungs bilaterally clear anterior, diminished lateral lower GI: soft, bsx4 active  Extremities: warm/dry, 1+ pedal edema  Skin: no rashes or lesions  Assessment & Plan:   Shock:  Right sided heart failure vs Sepsis Severe Tricuspid Valve Regurgitation  ECHO in 2011 with pulmonary HTN, BLE swelling, elevated LA/BNP, abdominal discomfort, hyponatremia and pre-syncopal concerning for right heart failureGiven h/o Pulmonary HTN on ECHO in 2011.  Elevated PCT raises concern for infectious process.  P: ICU monitoring  Continue levophed for MAP >65 RN to attempt to troubleshoot aline but likely should discontinue as not correlating  ECHO with severe tricuspid valve regurgitation  Repeat UC, await GI PCR (no further diarrhea since admit)  Sepsis? Hx diarrhea (pt on Miralax at home and has a h/o IBS), hypotension and elevated LA, increasing opacity in the right perihilar region and left lung base- ? Pneumonia.  Was  on Doxycycline at home.  Did not respond to 59ml/kg IVF bolus.  P: Follow cultures Repeat UC as above Continue empiric abx  Repeat PCT  Levophed for MAP >65   Sarcoidosis, COPD with emphysema Acute on Chronic Hypoxic Respiratory Failure PFTs 03/30/2009: FVC 67%, FEV1 48%, FEV1/FVC 52%, TLC 104%, DLCO 71%. No consistent Pulmonary follow up-  last seen 06/2018>>On that visit she had weight loss, cough and SOB Home meds include Duonebs, Pro Air and prednisone 20 mg BID (pt reports she was only taking once daily), doxycycline. Imaging shows end stage fibrotic changes P: Duoneb Q6  Solumedrol 40 mg BID  Wean O2 for sats >90%  Hypoglycemic Low Albumin >> poor nutritional status.  Cortisol 26.2 on admit  P: Diet as tolerated  Follow CBG's  Hyponatremia Poor PO intake, ?CHF contribution.  Clinically appears hypovolemic (dry MM) despite pedal edema  P: Follow Na Advance diet to regular as tolerated   Acute Kidney Injury P: Trend BMP / urinary output Replace electrolytes as indicated Avoid nephrotoxic agents, ensure adequate renal perfusion  Acute Encephalopathy Slurred words, h/o fall and presyncopal episodes May be secondary to hypotension Pt currently Alert awake and oriented to person and place P: Exam improved, follow neuro status closely  Fall precautions  PT / OT efforts   Best practice:  Diet: regular diet  Pain/Anxiety/Delirium protocol (if indicated): not needed no c/o pain VAP protocol (if indicated): n/a DVT prophylaxis: Hep SQ w/ SCDs GI prophylaxis: Protonix Glucose control: Monitor Mobility: Bedrest Code Status: Full.  Per prior discussion > Intubation, vasopressors and aggressive interventions reviwed. The daughter expressed that she did not want her mother to suffer but she did want reversible issues to be addressed.  Pt will remain a full code at this time.  Family Communication: Daughter [name] [phone] (home) [phone] (cell phone) updated 10/22 am.  Daughter confirms full code status.  Disposition: ICU  Labs   CBC: Recent Labs  Lab 05/31/19 1821 06/01/19 0523 06/02/19 0500  WBC 9.2 16.1* 13.7*  NEUTROABS 7.8*  --   --   HGB 10.0* 10.4* 10.7*  HCT 30.0* 29.7* 30.3*  MCV 86.5 82.7 81.9  PLT 146* 176 [id number]    Basic Metabolic Panel: Recent Labs  Lab 05/31/19 1821 06/01/19 0523  06/01/19 1530 06/02/19 0500  NA 125* 125* 132* 132*  K 4.7 4.0 3.0* 5.0  CL 89* 90* 104 97*  CO2 25 25 19* 26  GLUCOSE 56* 90 98 139*  BUN 32* 31* 22 25*  CREATININE 2.62* 1.82* 1.09* 1.20*  CALCIUM 7.3* 7.1* 5.5* 7.4*  MG  --  3.5*  --  3.0*  PHOS  --  3.9  --   --    GFR: Estimated Creatinine Clearance: 37.6 mL/min (A) (by C-G formula based on SCr of 1.2 mg/dL (H)). Recent Labs  Lab 05/31/19 1819 05/31/19 1821 06/01/19 0035 06/01/19 0523 06/02/19 0500  PROCALCITON  --   --  8.64  --   --   WBC  --  9.2  --  16.1* 13.7*  LATICACIDVEN 4.3*  --  3.1*  --   --     Liver Function Tests: Recent Labs  Lab 05/31/19 1821 06/01/19 0523  AST 38 48*  ALT 19 23  ALKPHOS 134* 137*  BILITOT 1.1 1.5*  PROT 4.8* 5.2*  ALBUMIN 2.0* 2.1*   Recent Labs  Lab 05/31/19 1821  LIPASE 13   No results for input(s): AMMONIA in the last 168 hours.  ABG  Component Value Date/Time   O2SAT 62.3 05/31/2019 2340     Coagulation Profile: No results for input(s): INR, PROTIME in the last 168 hours.  Cardiac Enzymes: No results for input(s): CKTOTAL, CKMB, CKMBINDEX, TROPONINI in the last 168 hours.  HbA1C: Hgb A1c MFr Bld  Date/Time Value Ref Range Status  06/12/2010 04:00 AM (H) <5.7 % Final   5.8 (NOTE)                                                                       According to the ADA Clinical Practice Recommendations for 2011, when HbA1c is used as a screening test:   >=6.5%   Diagnostic of Diabetes Mellitus           (if abnormal result  is confirmed)  5.7-6.4%   Increased risk of developing Diabetes Mellitus  References:Diagnosis and Classification of Diabetes Mellitus,Diabetes Care,2011,34(Suppl 1):S62-S69 and Standards of Medical Care in         Diabetes - 2011,Diabetes [id number]  (Suppl 1):S11-S61.  06/11/2010 06:36 PM  <5.7 % Final   5.6 (NOTE)                                                                       According to the ADA Clinical Practice  Recommendations for 2011, when HbA1c is used as a screening test:   >=6.5%   Diagnostic of Diabetes Mellitus           (if abnormal result  is confirmed)  5.7-6.4%   Increased risk of developing Diabetes Mellitus  References:Diagnosis and Classification of Diabetes Mellitus,Diabetes [id number] 1):S62-S69 and Standards of Medical Care in         Diabetes - 2011,Diabetes [id number]  (Suppl 1):S11-S61.    CBG: No results for input(s): GLUCAP in the last 168 hours.   Critical care time: 35 minutes    Noe Gens, NP-C [facility] 06/02/2019, 7:51 AM

## 2019-06-03 ENCOUNTER — Inpatient Hospital Stay (HOSPITAL_COMMUNITY): Payer: Medicare Other

## 2019-06-03 ENCOUNTER — Telehealth: Payer: Self-pay | Admitting: Pulmonary Disease

## 2019-06-03 LAB — URINE CULTURE: Culture: NO GROWTH

## 2019-06-03 LAB — CBC
HCT: 25.8 % — ABNORMAL LOW (ref 36.0–46.0)
Hemoglobin: 9.1 g/dL — ABNORMAL LOW (ref 12.0–15.0)
MCH: 29.2 pg (ref 26.0–34.0)
MCHC: 35.3 g/dL (ref 30.0–36.0)
MCV: 82.7 fL (ref 80.0–100.0)
Platelets: 128 10*3/uL — ABNORMAL LOW (ref 150–400)
RBC: 3.12 MIL/uL — ABNORMAL LOW (ref 3.87–5.11)
RDW: 16 % — ABNORMAL HIGH (ref 11.5–15.5)
WBC: 9.1 10*3/uL (ref 4.0–10.5)
nRBC: 0 % (ref 0.0–0.2)

## 2019-06-03 LAB — BASIC METABOLIC PANEL
Anion gap: 9 (ref 5–15)
BUN: 18 mg/dL (ref 8–23)
CO2: 25 mmol/L (ref 22–32)
Calcium: 7.7 mg/dL — ABNORMAL LOW (ref 8.9–10.3)
Chloride: 101 mmol/L (ref 98–111)
Creatinine, Ser: 0.88 mg/dL (ref 0.44–1.00)
GFR calc Af Amer: >60 mL/min (ref >60)
GFR calc non Af Amer: >60 mL/min (ref >60)
Glucose, Bld: 102 mg/dL — ABNORMAL HIGH (ref 70–99)
Potassium: 4.2 mmol/L (ref 3.5–5.1)
Sodium: 135 mmol/L (ref 135–145)

## 2019-06-03 LAB — CALCIUM, IONIZED: Calcium, Ionized, Serum: 4.5 mg/dL (ref 4.5–5.6)

## 2019-06-03 LAB — PROCALCITONIN: Procalcitonin: 3.15 ng/mL

## 2019-06-03 MED ORDER — PREDNISONE 20 MG PO TABS: 20.0000 mg | ORAL_TABLET | Freq: Every day | ORAL | Status: DC

## 2019-06-03 MED ORDER — PREDNISONE 20 MG PO TABS
30.0000 mg | ORAL_TABLET | Freq: Every day | ORAL | Status: DC
  Administered 2019-06-06 – 2019-06-07 (×2): 30 mg via ORAL
  Filled 2019-06-03 (×2): qty 1

## 2019-06-03 MED ORDER — PREDNISONE 20 MG PO TABS: 30.0000 mg | ORAL_TABLET | Freq: Every day | ORAL | Status: DC

## 2019-06-03 MED ORDER — PREDNISONE 20 MG PO TABS: 40.0000 mg | ORAL_TABLET | Freq: Every day | ORAL | Status: DC

## 2019-06-03 MED ORDER — PREDNISONE 20 MG PO TABS
40.0000 mg | ORAL_TABLET | Freq: Every day | ORAL | Status: AC
  Administered 2019-06-03 – 2019-06-05 (×3): 40 mg via ORAL
  Filled 2019-06-03 (×3): qty 2

## 2019-06-03 MED ORDER — METOPROLOL TARTRATE 5 MG/5ML IV SOLN
2.5000 mg | INTRAVENOUS | Status: DC | PRN
  Administered 2019-06-03 – 2019-06-05 (×6): 5 mg via INTRAVENOUS
  Filled 2019-06-03 (×6): qty 5

## 2019-06-03 MED ORDER — SODIUM CHLORIDE 0.9 % IV BOLUS
500.0000 mL | Freq: Once | INTRAVENOUS | Status: AC
  Administered 2019-06-03: 500 mL via INTRAVENOUS

## 2019-06-03 NOTE — Progress Notes (Addendum)
NAME:  [name], MRN:  [id number], DOB:  Jun 25, 1946, LOS: 3 ADMISSION DATE:  05/31/2019, CONSULTATION DATE:  05/31/2019 REFERRING MD:  Julianne Rice, CHIEF COMPLAINT:  AMS and Leg Swelling   Brief History   73 yr old F w/ PMHx Sarcoidosis, Pulm HTN, admitted 10/20 with reports of generalized weakness, BLE swelling, diarrhea and AMS. Afebrile, hypotensive on presentation.  Given 30cc/kg in ED for Sepsis LA 4.3. Started on Levophed when pt did not improve. PCCM asked to admit.  Concern for sepsis vs right heart failure.   Past Medical History  Sarcoidosis  COPD GERD HTN Cataracts Bilaterally, Blind in L eye Osteoporosis  Right Hip Arthroplasty  Prior ETOH Abuse  Significant Hospital Events   10/20 Admit with weakness, swelling, diarrhea, AMS.  Hypotensive, on levophed 10/21 Aline not correlating with BP cuff. Aline wave form abnormal.  Remains on 50mcg's levophed.   10/22 Weaned off levophed  10/23 Remains off levophed, LE swelling improved.  No acute events / BP stable.   Consults:  10/20 PCCM  Procedures:  R IJ TLC 10/21 >>   Significant Diagnostic Tests:   CT Chest w Contrast 11/209 >> pulmonary parenchymal changes compatible with fibrosis, similar-appearing extensive calcified mediastinal hilar adenopathy, subpleural nodular opacities in the LLL, aortic atherosclerosis   ECHO 2011 >> LVEF 0000000, grade 1 diastolic dysfunction, PA systolic [id number] mmHg, RA 99991111 mmHg  CXR 10/20 >> diffuse coarse interstitial opacities bilaterally with scarring and fibrotic changes  ECHO 10/21 >> LVEF 55-60%, indeterminate diastolic filling, global RV with mildly reduced systolic function, LA normal, RA mildly dilated, trace MVR, severe TVR, moderately elevated pulmonary artery pressure   Micro Data:  SARSCOV2 10/20 >> negative RVP 10/20 >> negative GI Path PCR 10/20 >>  UC 10/20 >> multiple species, re-collect  Sputum 10/20 >>  BCx2 10/20 >>    Antimicrobials:  Vanco  10/20 >> 10/23 Levaquin 10/20 >> 10/21  Cefepime 10/21 >>   Subjective   Pt reports feeling better.  States she is sleepy this am.  Denies SOB.  Reports swelling in LE's improved.  I/O - 1.4L UOP in 24h, net neg 783ml in 24h  Objective   Blood pressure 104/65, pulse (!) 117, temperature 98.3 F (36.8 C), temperature source Oral, resp. rate 19, height 5\' 5"  (1.651 m), weight 60.5 kg, SpO2 99 %.        Intake/Output Summary (Last 24 hours) at 06/03/2019 0806 Last data filed at 06/03/2019 0600 Gross per 24 hour  Intake 571.16 ml  Output 1400 ml  Net -828.84 ml   Filed Weights   06/01/19 0138 06/02/19 0500 06/03/19 0500  Weight: 53.6 kg 60.1 kg 60.5 kg    Examination: General: frail elderly female lying in bed in NAD  HEENT: MM pink/moist, anicteric  Neuro: Awake, alert, oriented, speech clear, MAE / generalized weakness  CV: s1s2 rrr, no m/r/g PULM:  Even/non-labored, lungs bilaterally with few soft wheezes  GI: soft, bsx4 active  Extremities: warm/dry, BLE edema improved   Skin: no rashes or lesions  Assessment & Plan:   Shock:  Right sided heart failure vs Sepsis Severe Tricuspid Valve Regurgitation  ECHO in 2011 with pulmonary HTN, BLE swelling, elevated LA/BNP, abdominal discomfort, hyponatremia and pre-syncopal concerning for right heart failure given h/o Pulmonary HTN on ECHO in 2011.  Elevated PCT raises concern for infectious process. ECHO with severe TR, RVSP 55.6.  Suspect element of right heart failure + infectious process (non-confirmed on culture, elevated PCT only).  P: Change bed status to SDU  Await repeat urine culture and GI PCR  Sepsis? Hx diarrhea (pt on Miralax at home and has a h/o IBS), hypotension and elevated LA, increasing opacity in the right perihilar region and left lung base- ? Pneumonia.  Was on Doxycycline at home.  Did not respond to 71ml/kg IVF bolus.  P: Await final cultures, neg thus far Follow up PCT > level would suggest an element  of infectious process.  Although we have not been able to identify any source Continue empiric cefepime, stop date added. Stop vancomycin.  Mild right perihilar airspace disease on admit, rapid clearing on CXR which would suggest volume rather than infection.   Sarcoidosis, COPD with emphysema Acute on Chronic Hypoxic Respiratory Failure PFTs 03/30/2009: FVC 67%, FEV1 48%, FEV1/FVC 52%, TLC 104%, DLCO 71%. No consistent Pulmonary follow up- last seen 06/2018>>On that visit she had weight loss, cough and SOB Home meds include Duonebs, Pro Air and prednisone 20 mg BID (pt reports she was only taking once daily), doxycycline. Imaging shows end stage fibrotic changes P: Duoneb Q6 Change to prednisone 40 mg QD x3 days, then 30 mg x3 days, then to 20 mg QD & stay.  Pulmonary can evaluate to determine if she can come off as outpatient. Wean O2 sats > 90% Will need pulmonary follow up at outpatient, office will arrange follow up  Hypoglycemic Low Albumin >> poor nutritional status.  Cortisol 26.2 on admit  P: Diet as tolerated  Follow CBG's   Hyponatremia Poor PO intake, ?CHF contribution.  Clinically appears hypovolemic (dry MM) despite pedal edema  P: Advance diet as tolerated  Follow Na  Acute Kidney Injury P: Trend BMP / urinary output Replace electrolytes as indicated Avoid nephrotoxic agents, ensure adequate renal perfusion  Acute Encephalopathy - resolved.  Deconditioning  Slurred words, h/o fall and presyncopal episodes May be secondary to hypotension Pt currently Alert awake and oriented to person and place P: Follow neuro exam closely  PT / OT efforts   Best practice:  Diet: regular diet  Pain/Anxiety/Delirium protocol (if indicated): not needed no c/o pain VAP protocol (if indicated): n/a DVT prophylaxis: Hep SQ w/ SCDs GI prophylaxis: Protonix Glucose control: Monitor Mobility: Bedrest Code Status: Full.  Per prior discussion > Intubation, vasopressors and  aggressive interventions reviwed. The daughter expressed that she did not want her mother to suffer but she did want reversible issues to be addressed.  Pt will remain a full code at this time.  Family Communication: Daughter [name] [phone] (home) [phone] (cell phone) updated 10/22 am. Daughter confirms full code status.  Disposition: SDU.  Transfer to [facility] as of 10/24.    Labs   CBC: Recent Labs  Lab 05/31/19 1821 06/01/19 0523 06/02/19 0500 06/03/19 0410  WBC 9.2 16.1* 13.7* 9.1  NEUTROABS 7.8*  --   --   --   HGB 10.0* 10.4* 10.7* 9.1*  HCT 30.0* 29.7* 30.3* 25.8*  MCV 86.5 82.7 81.9 82.7  PLT 146* 176 189 128*    Basic Metabolic Panel: Recent Labs  Lab 05/31/19 1821 06/01/19 0523 06/01/19 1530 06/02/19 0500 06/03/19 0410  NA 125* 125* 132* 132* 135  K 4.7 4.0 3.0* 5.0 4.2  CL 89* 90* 104 97* 101  CO2 25 25 19* 26 25  GLUCOSE 56* 90 98 139* 102*  BUN 32* 31* 22 25* 18  CREATININE 2.62* 1.82* 1.09* 1.20* 0.88  CALCIUM 7.3* 7.1* 5.5* 7.4* 7.7*  MG  --  3.5*  --  3.0*  --   PHOS  --  3.9  --   --   --    GFR: Estimated Creatinine Clearance: 51.2 mL/min (by C-G formula based on SCr of 0.88 mg/dL). Recent Labs  Lab 05/31/19 1819 05/31/19 1821 06/01/19 0035 06/01/19 0523 06/02/19 0500 06/03/19 0410  PROCALCITON  --   --  8.64  --  5.79 3.15  WBC  --  9.2  --  16.1* 13.7* 9.1  LATICACIDVEN 4.3*  --  3.1*  --   --   --     Liver Function Tests: Recent Labs  Lab 05/31/19 1821 06/01/19 0523  AST 38 48*  ALT 19 23  ALKPHOS 134* 137*  BILITOT 1.1 1.5*  PROT 4.8* 5.2*  ALBUMIN 2.0* 2.1*   Recent Labs  Lab 05/31/19 1821  LIPASE 13   No results for input(s): AMMONIA in the last 168 hours.  ABG    Component Value Date/Time   O2SAT 62.3 05/31/2019 2340     Coagulation Profile: No results for input(s): INR, PROTIME in the last 168 hours.  Cardiac Enzymes: No results for input(s): CKTOTAL, CKMB, CKMBINDEX, TROPONINI in the last 168 hours.   HbA1C: Hgb A1c MFr Bld  Date/Time Value Ref Range Status  06/12/2010 04:00 AM (H) <5.7 % Final   5.8 (NOTE)                                                                       According to the ADA Clinical Practice Recommendations for 2011, when HbA1c is used as a screening test:   >=6.5%   Diagnostic of Diabetes Mellitus           (if abnormal result  is confirmed)  5.7-6.4%   Increased risk of developing Diabetes Mellitus  References:Diagnosis and Classification of Diabetes Mellitus,Diabetes Care,2011,34(Suppl 1):S62-S69 and Standards of Medical Care in         Diabetes - 2011,Diabetes [id number]  (Suppl 1):S11-S61.  06/11/2010 06:36 PM  <5.7 % Final   5.6 (NOTE)                                                                       According to the ADA Clinical Practice Recommendations for 2011, when HbA1c is used as a screening test:   >=6.5%   Diagnostic of Diabetes Mellitus           (if abnormal result  is confirmed)  5.7-6.4%   Increased risk of developing Diabetes Mellitus  References:Diagnosis and Classification of Diabetes Mellitus,Diabetes [id number] 1):S62-S69 and Standards of Medical Care in         Diabetes - 2011,Diabetes [id number]  (Suppl 1):S11-S61.    CBG: No results for input(s): GLUCAP in the last 168 hours.   Critical care time: n/a     Noe Gens, NP-C [facility] 06/03/2019, 8:06 AM

## 2019-06-03 NOTE — Plan of Care (Signed)
73 year old female admitted on 05/31/2019 with generalized weakness, bilateral lower extremity swelling and changes in mental status.  She was hypotensive on admission was treated with Levophed.  Since there was a high suspicion for sepsis with elevated procalcitonin and leukocytosis she was treated with Vanco and cefepime initially.  So far the cultures have been negative.  Plan is to continue cefepime for a total of 7 days.  At this time we have no source of infection.  She also has a history of pulmonary artery hypertension echo confirmed pulmonary artery hypertension with normal ejection fraction and severe tricuspid regurgitation.  She was not given any diuretics in spite of this she remains negative her pedal edema improved chest x-ray cleared.  It looks like she had a mixed component of sepsis and right heart failure in this lady with history of sarcoidosis.  GI panel negative respiratory virus panel negative Covid negative.  TRH pickup 06/04/2019

## 2019-06-03 NOTE — Progress Notes (Addendum)
[place] Progress Note Patient Name: LEMA STIDHAM DOB: Jul 12, 1946 MRN: [id number]   Date of Service  06/03/2019  HPI/Events of Note  HR trending up, no 130s appears sinus. Negative 1 liter. Off pressors >24 hours  eICU Interventions  Will give a trial of 500 cc NS bolus HR remains elevated ordered metoprolol. Sinus tach on EKG     Intervention Category Major Interventions: Arrhythmia - evaluation and management  Judd Lien 06/03/2019, 8:50 PM

## 2019-06-03 NOTE — Evaluation (Signed)
Physical Therapy Evaluation Patient Details Name: [name] MRN: [id number] DOB: [birth year] Today's Date: 06/03/2019   History of Present Illness  73 year old woman with PMHx of sarcoidosis, COPD, blind left eye and admitted with weakness, swelling, diarrhea, AMS.  Pt also hypotensive on presentation and requried Levophed  Clinical Impression  Pt admitted with above diagnosis.  Pt currently with functional limitations due to the deficits listed below (see PT Problem List). Pt will benefit from skilled PT to increase their independence and safety with mobility to allow discharge to the venue listed below.   Pt typically ambulates at home with [facility]. Pt only able to sit EOB today due to reported dizziness, weakness, and fatigue.  BP stable with activity.  Pt remained on 2L O2 [place] and SPO2 96% or greater during session.  Pt's HR did increase to 140 bpm with sitting however.  Pt fatigued quickly and assist back to supine.  Pt anticipates d/c home and she lives with her daughter and grandchildren.  Pt will likely progress well once mobilizing more. RN agreeable to attempt to assist pt up to recliner later today (since pt declined at time of session).     Follow Up Recommendations Home health PT;Supervision/Assistance - 24 hour    Equipment Recommendations  Rolling walker with 5" wheels;3in1 (PT)    Recommendations for Other Services       Precautions / Restrictions Precautions Precautions: Fall Precaution Comments: monitor HR, R IJ line      Mobility  Bed Mobility Overal bed mobility: Needs Assistance Bed Mobility: Rolling;Supine to Sit;Sit to Supine Rolling: Min assist   Supine to sit: Min assist Sit to supine: Min assist   General bed mobility comments: pt initiates however fatigues quickly and requires min assist to complete  Transfers                 General transfer comment: pt declined standing or transferring to recliner due to fatigue and  weakness  Ambulation/Gait                Stairs            Wheelchair Mobility    Modified Rankin (Stroke Patients Only)       Balance Overall balance assessment: Needs assistance Sitting-balance support: Bilateral upper extremity supported Sitting balance-Leahy Scale: Poor Sitting balance - Comments: requires UE support, fatigues quickly                                     Pertinent Vitals/Pain Pain Assessment: No/denies pain    Home Living Family/patient expects to be discharged to:: Private residence Living Arrangements: Children   Type of Home: House       Home Layout: One level Home Equipment: Kasandra Knudsen - single point      Prior Function Level of Independence: Independent with assistive device(s)               Hand Dominance        Extremity/Trunk Assessment   Upper Extremity Assessment Upper Extremity Assessment: Generalized weakness    Lower Extremity Assessment Lower Extremity Assessment: Generalized weakness    Cervical / Trunk Assessment Cervical / Trunk Assessment: Normal  Communication   Communication: No difficulties  Cognition Arousal/Alertness: Awake/alert Behavior During Therapy: WFL for tasks assessed/performed Overall Cognitive Status: Within Functional Limits for tasks assessed  General Comments      Exercises     Assessment/Plan    PT Assessment Patient needs continued PT services  PT Problem List Decreased strength;Decreased mobility;Decreased activity tolerance;Decreased balance;Decreased knowledge of use of DME       PT Treatment Interventions Gait training;DME instruction;Therapeutic exercise;Balance training;Functional mobility training;Therapeutic activities;Patient/family education    PT Goals (Current goals can be found in the Care Plan section)  Acute Rehab PT Goals PT Goal Formulation: With patient Time For Goal Achievement:  06/17/19 Potential to Achieve Goals: Good    Frequency Min 3X/week   Barriers to discharge        Co-evaluation               AM-PAC PT "6 Clicks" Mobility  Outcome Measure Help needed turning from your back to your side while in a flat bed without using bedrails?: A Little Help needed moving from lying on your back to sitting on the side of a flat bed without using bedrails?: A Little Help needed moving to and from a bed to a chair (including a wheelchair)?: A Lot Help needed standing up from a chair using your arms (e.g., wheelchair or bedside chair)?: A Lot Help needed to walk in hospital room?: Total Help needed climbing 3-5 steps with a railing? : Total 6 Click Score: 12    End of Session Equipment Utilized During Treatment: Oxygen Activity Tolerance: Patient tolerated treatment well Patient left: in bed;with bed alarm set;with call bell/phone within reach Nurse Communication: Mobility status PT Visit Diagnosis: Muscle weakness (generalized) (M62.81);Difficulty in walking, not elsewhere classified (R26.2)    Time: [id number] PT Time Calculation (min) (ACUTE ONLY): 26 min   Charges:   PT Evaluation $PT Eval Low Complexity: [place], PT, DPT Acute Rehabilitation Services Office: [phone] Pager: [phone]  Trena Platt 06/03/2019, 12:17 PM

## 2019-06-03 NOTE — Telephone Encounter (Signed)
Please arrange for an appointment for [name] to be seen by Dr. Ander Slade in one month to follow up for sarcoidosis, prednisone taper.

## 2019-06-04 DIAGNOSIS — I10 Essential (primary) hypertension: Secondary | ICD-10-CM

## 2019-06-04 DIAGNOSIS — R000 Tachycardia, unspecified: Secondary | ICD-10-CM

## 2019-06-04 DIAGNOSIS — J441 Chronic obstructive pulmonary disease with (acute) exacerbation: Secondary | ICD-10-CM

## 2019-06-04 LAB — GI PATHOGEN PANEL BY PCR, STOOL

## 2019-06-04 LAB — CBC
HCT: 28 % — ABNORMAL LOW (ref 36.0–46.0)
Hemoglobin: 9.7 g/dL — ABNORMAL LOW (ref 12.0–15.0)
MCH: 28.9 pg (ref 26.0–34.0)
MCHC: 34.6 g/dL (ref 30.0–36.0)
MCV: 83.3 fL (ref 80.0–100.0)
Platelets: 142 10*3/uL — ABNORMAL LOW (ref 150–400)
RBC: 3.36 MIL/uL — ABNORMAL LOW (ref 3.87–5.11)
RDW: 16.1 % — ABNORMAL HIGH (ref 11.5–15.5)
WBC: 7.6 10*3/uL (ref 4.0–10.5)
nRBC: 0 % (ref 0.0–0.2)

## 2019-06-04 LAB — BASIC METABOLIC PANEL
Anion gap: 9 (ref 5–15)
BUN: 15 mg/dL (ref 8–23)
CO2: 27 mmol/L (ref 22–32)
Calcium: 8.1 mg/dL — ABNORMAL LOW (ref 8.9–10.3)
Chloride: 101 mmol/L (ref 98–111)
Creatinine, Ser: 0.68 mg/dL (ref 0.44–1.00)
GFR calc Af Amer: >60 mL/min (ref >60)
GFR calc non Af Amer: >60 mL/min (ref >60)
Glucose, Bld: 107 mg/dL — ABNORMAL HIGH (ref 70–99)
Potassium: 3.6 mmol/L (ref 3.5–5.1)
Sodium: 137 mmol/L (ref 135–145)

## 2019-06-04 LAB — PROCALCITONIN: Procalcitonin: 1.55 ng/mL

## 2019-06-04 LAB — TROPONIN I (HIGH SENSITIVITY): Troponin I (High Sensitivity): 41 ng/L — ABNORMAL HIGH (ref <18)

## 2019-06-04 MED ORDER — METOPROLOL SUCCINATE ER 25 MG PO TB24
50.0000 mg | ORAL_TABLET | Freq: Every day | ORAL | Status: DC
  Administered 2019-06-04 – 2019-06-07 (×4): 50 mg via ORAL
  Filled 2019-06-04 (×4): qty 2

## 2019-06-04 MED ORDER — ACETAMINOPHEN 325 MG PO TABS: 650.0000 mg | ORAL_TABLET | Freq: Four times a day (QID) | ORAL | Status: DC | PRN

## 2019-06-04 MED ORDER — PRAVASTATIN SODIUM 20 MG PO TABS
40.0000 mg | ORAL_TABLET | Freq: Every day | ORAL | Status: DC
  Administered 2019-06-04 – 2019-06-06 (×3): 40 mg via ORAL
  Filled 2019-06-04 (×3): qty 2

## 2019-06-04 MED ORDER — LEVALBUTEROL HCL 0.63 MG/3ML IN NEBU: 0.6300 mg | INHALATION_SOLUTION | Freq: Four times a day (QID) | RESPIRATORY_TRACT | Status: DC | PRN

## 2019-06-04 MED ORDER — IPRATROPIUM BROMIDE 0.02 % IN SOLN
0.5000 mg | Freq: Four times a day (QID) | RESPIRATORY_TRACT | Status: DC
  Administered 2019-06-04 – 2019-06-05 (×5): 0.5 mg via RESPIRATORY_TRACT
  Filled 2019-06-04 (×5): qty 2.5

## 2019-06-04 MED ORDER — GABAPENTIN 100 MG PO CAPS
100.0000 mg | ORAL_CAPSULE | Freq: Every day | ORAL | Status: DC
  Administered 2019-06-04 – 2019-06-06 (×3): 100 mg via ORAL
  Filled 2019-06-04 (×3): qty 1

## 2019-06-04 MED ORDER — LEVALBUTEROL HCL 0.63 MG/3ML IN NEBU
0.6300 mg | INHALATION_SOLUTION | Freq: Four times a day (QID) | RESPIRATORY_TRACT | Status: DC
  Administered 2019-06-04 – 2019-06-05 (×5): 0.63 mg via RESPIRATORY_TRACT
  Filled 2019-06-04 (×5): qty 3

## 2019-06-04 MED ORDER — PANTOPRAZOLE SODIUM 40 MG PO TBEC
40.0000 mg | DELAYED_RELEASE_TABLET | Freq: Every day | ORAL | Status: DC
  Administered 2019-06-04: 40 mg via ORAL
  Filled 2019-06-04 (×2): qty 1

## 2019-06-04 MED ORDER — AMLODIPINE BESYLATE 5 MG PO TABS
5.0000 mg | ORAL_TABLET | Freq: Every day | ORAL | Status: DC
  Administered 2019-06-04 – 2019-06-07 (×4): 5 mg via ORAL
  Filled 2019-06-04 (×4): qty 1

## 2019-06-04 NOTE — Progress Notes (Signed)
Rt went over IS and flutter valve. Pt is going to need encourage at doing.

## 2019-06-04 NOTE — Progress Notes (Signed)
[place] Progress Note Patient Name: [name] DOB: October 24, 1945 MRN: [id number]   Date of Service  06/04/2019  HPI/Events of Note  Patient complained of chest pain. Now just throat pain. EKG done appears unchanged. HR now 110-120s with metoprolol  eICU Interventions  Ordered troponin     Intervention Category Intermediate Interventions: Pain - evaluation and management  Judd Lien 06/04/2019, 3:18 AM

## 2019-06-04 NOTE — Progress Notes (Signed)
PROGRESS NOTE   [name]  [id number]    DOB: Oct 28, 1945    DOA: 05/31/2019  PCP: Isaac Bliss, Rayford Halsted, MD   I have briefly reviewed patients previous medical records in [facility].  Chief Complaint  Patient presents with  . Altered Mental Status  . Leg Swelling    Brief Narrative:  73 year old female, lives with her daughter and grandchildren, ambulates with the help of a cane, PMH of sarcoidosis on chronic prednisone, associated ILD and pulmonary hypertension, former tobacco abuse, COPD, GERD, HTN, blind in left eye, presented on 10/20 due to generalized weakness, bilateral leg edema, diarrhea and altered mental status.  Admitted to ICU by CCM due to shock-multifactorial due to right heart failure versus sepsis versus hypovolemia.  Stabilized and transferred to [facility] on 10/24.   Assessment & Plan:   Active Problems:   Encounter for central line placement   Severe sepsis ([place])   Shock ([place])   Central venous catheter in place   AKI (acute kidney injury) ([place])   Prolonged QT interval   Pulmonary hypertension (HCC)   Hyponatremia   Hypoglycemia   Encephalopathy acute   Acute respiratory failure with hypoxia (HCC)   Shock  Suspected multifactorial due to right heart failure versus sepsis versus hypovolemia in the context of relative adrenal insufficiency in patient on chronic steroids.  Off of pressors.  Now starting to be hypertensive.  Resume home dose of amlodipine and Toprol-XL.  TTE 10/21: LVEF 55-60%.  Mildly reduced right ventricular systolic function.  Severe TR RVSP 55.6.  Possible sepsis  Infectious process suspected although not confirmed on culture.  Elevated PCT only.  PCT trending down.  Urine culture negative.  Blood cultures x2: Negative to date.  RSV panel negative.  SARS coronavirus 2 testing negative.  On presentation had diarrhea, hypotension, elevated lactate.  Per PCCM, vancomycin discontinued, complete total 1 week course of  cefepime.  Although some concern for pneumonia on admission (opacity in the right perihilar region and left lung base) rapid clearance on chest x-ray suggests volume rather than infection.  Sarcoidosis  As per pulmonology, although MAR indicates prednisone 20 mg twice daily at home, she reportedly was taking once daily.  Now on prednisone taper down to 20 mg daily.    Pulmonology will arrange outpatient follow-up for further tapering of steroids.  Chest x-ray 10/23 shows chronic fibrotic changes and calcified adenopathy, stable from prior.  COPD with mild exacerbation.  Noted mild wheezing today.  Already on oral prednisone and IV cefepime, continue.  Changed albuterol/DuoNeb nebulizations to Xopenex and Atrovent due to sinus tachycardia.  Flutter valve and incentive spirometry added.  Former tobacco abuse  Acute respiratory failure with hypoxia  Not on home oxygen PTA.  Wean oxygen to saturations between 89-92%.  Hypoglycemia/hypokalemia/hyponatremia/acute kidney injury/acute metabolic encephalopathy  Resolved.  Deconditioning  Therapist to evaluate.   Severe TR/pulmonary hypertension  Diarrhea  Suspect improved or even resolved.  1 BM documented all day yesterday.  GI panel pending.  Low index of suspicion for C. difficile.  Sinus tachycardia  Unclear etiology.  May be related to mild dyspnea and COPD exacerbation.  Changed BD's to Xopenex as above.  Initiated prior home dose of Toprol-XL 50 mg daily which should help.  Essential hypertension  Now hypertensive.  Resume home dose of amlodipine and Toprol-XL.  Atypical chest pain  Noted overnight 10/23.  EKG without acute findings.  HS troponin 40, not significant.  Resolved without recurrence.  Malnutrition  Consulted dietitian  Normocytic anemia, undefined type  May be chronic disease.  Stable.  Mild thrombocytopenia  Better.   DVT prophylaxis: Subcutaneous heparin Code Status: Full  Family Communication: None at bedside Disposition: To be determined pending clinical improvement.  Continue to manage in stepdown unit for additional 24 hours.   Consultants:  PCCM signed off 10/23.  Procedures:  Right IJ central line.  Antimicrobials:  IV cefepime 10/20 > IV levofloxacin x1 dose 10/20 IV vancomycin 10/20 > 10/22.   Subjective: Patient interviewed and examined with RN in room.  Dyspnea better but breathing not yet at baseline.  Intermittent dry hacking cough.  Had some left upper chest pain and neck pain last night during episode of "choking" which resolved without recurrence.  Wants to go home soon.  Objective:  Vitals:   06/04/19 0700 06/04/19 0800 06/04/19 0857 06/04/19 0929  BP: (!) 144/96   (!) 141/94  Pulse:    (!) 131  Resp: 19     Temp:  98.4 F (36.9 C)    TempSrc:  Oral    SpO2:   98%   Weight:      Height:        Examination:  General exam: Elderly female, moderately built and frail, chronically ill looking lying comfortably propped up in bed.  Oral mucosa moist. Respiratory system: Reduced breath sounds bilaterally/harsh with scattered few expiratory rhonchi.  Occasional basal crackles.  Mild intermittent tachypnea. Cardiovascular system: S1 & S2 heard, regular tachycardia. No JVD, murmurs, rubs, gallops or clicks. No pedal edema.  Telemetry personally reviewed: Sinus tachycardia in the 120s-130s.  Gastrointestinal system: Abdomen is nondistended, soft and nontender. No organomegaly or masses felt. Normal bowel sounds heard. Central nervous system: Alert and oriented. No focal neurological deficits. Extremities: Symmetric 5 x 5 power. Skin: No rashes, lesions or ulcers Psychiatry: Judgement and insight appear normal. Mood & affect appropriate. Neck: Right IJ CVL.    Data Reviewed: I have personally reviewed following labs and imaging studies  CBC: Recent Labs  Lab 05/31/19 1821 06/01/19 0523 06/02/19 0500 06/03/19 0410 06/04/19  0245  WBC 9.2 16.1* 13.7* 9.1 7.6  NEUTROABS 7.8*  --   --   --   --   HGB 10.0* 10.4* 10.7* 9.1* 9.7*  HCT 30.0* 29.7* 30.3* 25.8* 28.0*  MCV 86.5 82.7 81.9 82.7 83.3  PLT 146* 176 189 128* [id number]*   Basic Metabolic Panel: Recent Labs  Lab 06/01/19 0523 06/01/19 1530 06/02/19 0500 06/03/19 0410 06/04/19 0245  NA 125* 132* 132* 135 137  K 4.0 3.0* 5.0 4.2 3.6  CL 90* 104 97* 101 101  CO2 25 19* 26 25 27   GLUCOSE 90 98 139* 102* 107*  BUN 31* 22 25* 18 15  CREATININE 1.82* 1.09* 1.20* 0.88 0.68  CALCIUM 7.1* 5.5* 7.4* 7.7* 8.1*  MG 3.5*  --  3.0*  --   --   PHOS 3.9  --   --   --   --    Liver Function Tests: Recent Labs  Lab 05/31/19 1821 06/01/19 0523  AST 38 48*  ALT 19 23  ALKPHOS 134* 137*  BILITOT 1.1 1.5*  PROT 4.8* 5.2*  ALBUMIN 2.0* 2.1*    Cardiac Enzymes: No results for input(s): CKTOTAL, CKMB, CKMBINDEX, TROPONINI in the last 168 hours.  CBG: No results for input(s): GLUCAP in the last 168 hours.  Recent Results (from the past 240 hour(s))  Culture, blood (Routine X 2) w Reflex to ID Panel  Status: None (Preliminary result)   Collection Time: 05/31/19  6:20 PM   Specimen: BLOOD LEFT FOREARM  Result Value Ref Range Status   Specimen Description   Final    BLOOD LEFT FOREARM Performed at [facility] [address], [place], [place] [address]    Special Requests   Final    BOTTLES DRAWN AEROBIC AND ANAEROBIC Blood Culture adequate volume Performed at [facility] [address] [place], [place] [address]    Culture   Final    NO GROWTH 4 DAYS Performed at [facility], [place] [address], [place], [place] [address]    Report Status PENDING  Incomplete  SARS Coronavirus 2 by RT PCR (hospital order, performed in [facility] hospital lab) Nasopharyngeal Nasopharyngeal Swab     Status: None   Collection Time: 05/31/19  6:35 PM   Specimen: Nasopharyngeal Swab  Result Value Ref Range Status   SARS  Coronavirus 2 NEGATIVE NEGATIVE Final    Comment: (NOTE) If result is NEGATIVE SARS-CoV-2 target nucleic acids are NOT DETECTED. The SARS-CoV-2 RNA is generally detectable in upper and lower  respiratory specimens during the acute phase of infection. The lowest  concentration of SARS-CoV-2 viral copies this assay can detect is 250  copies / mL. A negative result does not preclude SARS-CoV-2 infection  and should not be used as the sole basis for treatment or other  patient management decisions.  A negative result may occur with  improper specimen collection / handling, submission of specimen other  than nasopharyngeal swab, presence of viral mutation(s) within the  areas targeted by this assay, and inadequate number of viral copies  (<250 copies / mL). A negative result must be combined with clinical  observations, patient history, and epidemiological information. If result is POSITIVE SARS-CoV-2 target nucleic acids are DETECTED. The SARS-CoV-2 RNA is generally detectable in upper and lower  respiratory specimens dur ing the acute phase of infection.  Positive  results are indicative of active infection with SARS-CoV-2.  Clinical  correlation with patient history and other diagnostic information is  necessary to determine patient infection status.  Positive results do  not rule out bacterial infection or co-infection with other viruses. If result is PRESUMPTIVE POSTIVE SARS-CoV-2 nucleic acids MAY BE PRESENT.   A presumptive positive result was obtained on the submitted specimen  and confirmed on repeat testing.  While 2019 novel coronavirus  (SARS-CoV-2) nucleic acids may be present in the submitted sample  additional confirmatory testing may be necessary for epidemiological  and / or clinical management purposes  to differentiate between  SARS-CoV-2 and other Sarbecovirus currently known to infect humans.  If clinically indicated additional testing with an alternate test   methodology [phone]) is advised. The SARS-CoV-2 RNA is generally  detectable in upper and lower respiratory sp ecimens during the acute  phase of infection. The expected result is Negative. Fact Sheet for Patients:  StrictlyIdeas.no Fact Sheet for Healthcare Providers: BankingDealers.co.za This test is not yet approved or cleared by the [place] FDA and has been authorized for detection and/or diagnosis of SARS-CoV-2 by FDA under an Emergency Use Authorization (EUA).  This EUA will remain in effect (meaning this test can be used) for the duration of the COVID-19 declaration under Section 564(b)(1) of the Act, 21 U.S.C. section 360bbb-3(b)(1), unless the authorization is terminated or revoked sooner. Performed at [facility], [place] [address], [place],  [address]   MRSA PCR Screening  Status: None   Collection Time: 05/31/19 11:46 PM   Specimen: Nasopharyngeal  Result Value Ref Range Status   MRSA by PCR NEGATIVE NEGATIVE Final    Comment:        The GeneXpert MRSA Assay (FDA approved for NASAL specimens only), is one component of a comprehensive MRSA colonization surveillance program. It is not intended to diagnose MRSA infection nor to guide or monitor treatment for MRSA infections. Performed at [facility], [place] [address] [place], [place] [address]   Urine culture     Status: Abnormal   Collection Time: 06/01/19  1:16 AM   Specimen: Urine, Random  Result Value Ref Range Status   Specimen Description   Final    URINE, RANDOM Performed at [facility] [address] [place], [place] [address]    Special Requests   Final    NONE Performed at [facility], [place] [address], [place], [place] [address]    Culture MULTIPLE SPECIES PRESENT, SUGGEST RECOLLECTION (A)  Final   Report Status 06/01/2019 FINAL  Final  Culture, blood (Routine  X 2) w Reflex to ID Panel     Status: None (Preliminary result)   Collection Time: 06/01/19  2:22 AM   Specimen: BLOOD  Result Value Ref Range Status   Specimen Description   Final    BLOOD LEFT ARM Performed at [facility] [address], [place], [place] [address]    Special Requests   Final    BOTTLES DRAWN AEROBIC ONLY Blood Culture adequate volume Performed at [facility] [address], [place], [place] [address]    Culture   Final    NO GROWTH 3 DAYS Performed at [facility], [place] [address] [place], [place] [address]    Report Status PENDING  Incomplete  Respiratory Panel by PCR     Status: None   Collection Time: 06/01/19  2:45 AM   Specimen: Respiratory  Result Value Ref Range Status   Adenovirus NOT DETECTED NOT DETECTED Final   Coronavirus 229E NOT DETECTED NOT DETECTED Final    Comment: (NOTE) The Coronavirus on the Respiratory Panel, DOES NOT test for the novel  Coronavirus (2019 nCoV)    Coronavirus HKU1 NOT DETECTED NOT DETECTED Final   Coronavirus NL63 NOT DETECTED NOT DETECTED Final   Coronavirus OC43 NOT DETECTED NOT DETECTED Final   Metapneumovirus NOT DETECTED NOT DETECTED Final   Rhinovirus / Enterovirus NOT DETECTED NOT DETECTED Final   Influenza A NOT DETECTED NOT DETECTED Final   Influenza B NOT DETECTED NOT DETECTED Final   Parainfluenza Virus 1 NOT DETECTED NOT DETECTED Final   Parainfluenza Virus 2 NOT DETECTED NOT DETECTED Final   Parainfluenza Virus 3 NOT DETECTED NOT DETECTED Final   Parainfluenza Virus 4 NOT DETECTED NOT DETECTED Final   Respiratory Syncytial Virus NOT DETECTED NOT DETECTED Final   Bordetella pertussis NOT DETECTED NOT DETECTED Final   Chlamydophila pneumoniae NOT DETECTED NOT DETECTED Final   Mycoplasma pneumoniae NOT DETECTED NOT DETECTED Final    Comment: Performed at [facility], [place]. [address] [place], [place] [address]  Culture, Urine     Status: None    Collection Time: 06/02/19  6:30 PM   Specimen: Urine, Random  Result Value Ref Range Status   Specimen Description   Final    URINE, RANDOM Performed at [facility] [address], [place], [place] [address]  Special Requests   Final    NONE Performed at [facility], [place] [address] [place], [place] [address]    Culture   Final    NO GROWTH Performed at [facility], [place] [address] [place], [place] [address]    Report Status 06/03/2019 FINAL  Final         Radiology Studies: Dg Chest Port 1 View  Result Date: 06/03/2019 CLINICAL DATA:  Respiratory failure EXAM: PORTABLE CHEST 1 VIEW COMPARISON:  06/01/2019 FINDINGS: Cardiac shadow is stable. Right jugular central line is again seen and stable. Bilateral upper lobe scarring is again seen with volume loss and hilar retraction. Multiple calcified lymph nodes are noted centrally. Previously seen right perihilar density has improved in the interval from the prior exam. Tiny pleural effusions are again seen. IMPRESSION: Improved aeration in the right perihilar region when compare with the prior study. Overall chronic fibrotic changes with calcified adenopathy stable from the previous exam. Electronically Signed   By: Inez Catalina M.D.   On: 06/03/2019 07:10        Scheduled Meds: . amLODipine  5 mg Oral Daily  . Chlorhexidine Gluconate Cloth  6 each Topical Daily  . gabapentin  100 mg Oral QHS  . heparin  5,000 Units Subcutaneous Q8H  . ipratropium  0.5 mg Nebulization Q6H  . levalbuterol  0.63 mg Nebulization Q6H  . metoprolol succinate  50 mg Oral Daily  . pantoprazole  40 mg Oral Daily  . pravastatin  40 mg Oral q1800  . predniSONE  40 mg Oral Q breakfast   Followed by  . [START ON 06/06/2019] predniSONE  30 mg Oral Q breakfast   Followed by  . [START ON 06/09/2019] predniSONE  20 mg Oral Q breakfast  . sodium chloride flush  10-40 mL Intracatheter Q12H   Continuous  Infusions: . sodium chloride 10 mL/hr at 06/03/19 0600  . ceFEPime (MAXIPIME) IV 2 g (06/04/19 [id number])     LOS: 4 days     Vernell Leep, MD, FACP, [facility]. [facility]  To contact the attending provider between 7A-7P or the covering provider during after hours 7P-7A, please log into the web site [email or web address] and access using universal [place] password for that web site. If you do not have the password, please call the hospital operator.  06/04/2019, 10:37 AM

## 2019-06-05 DIAGNOSIS — E876 Hypokalemia: Secondary | ICD-10-CM

## 2019-06-05 LAB — COMPREHENSIVE METABOLIC PANEL
ALT: 46 U/L — ABNORMAL HIGH (ref 0–44)
AST: 75 U/L — ABNORMAL HIGH (ref 15–41)
Albumin: 2.4 g/dL — ABNORMAL LOW (ref 3.5–5.0)
Alkaline Phosphatase: 282 U/L — ABNORMAL HIGH (ref 38–126)
Anion gap: 9 (ref 5–15)
BUN: 12 mg/dL (ref 8–23)
CO2: 31 mmol/L (ref 22–32)
Calcium: 8.4 mg/dL — ABNORMAL LOW (ref 8.9–10.3)
Chloride: 96 mmol/L — ABNORMAL LOW (ref 98–111)
Creatinine, Ser: 0.39 mg/dL — ABNORMAL LOW (ref 0.44–1.00)
GFR calc Af Amer: >60 mL/min (ref >60)
GFR calc non Af Amer: >60 mL/min (ref >60)
Glucose, Bld: 83 mg/dL (ref 70–99)
Potassium: 3 mmol/L — ABNORMAL LOW (ref 3.5–5.1)
Sodium: 136 mmol/L (ref 135–145)
Total Bilirubin: 1.4 mg/dL — ABNORMAL HIGH (ref 0.3–1.2)
Total Protein: 5.4 g/dL — ABNORMAL LOW (ref 6.5–8.1)

## 2019-06-05 LAB — CBC
HCT: 30.4 % — ABNORMAL LOW (ref 36.0–46.0)
Hemoglobin: 10.3 g/dL — ABNORMAL LOW (ref 12.0–15.0)
MCH: 28.5 pg (ref 26.0–34.0)
MCHC: 33.9 g/dL (ref 30.0–36.0)
MCV: 84 fL (ref 80.0–100.0)
Platelets: 193 10*3/uL (ref 150–400)
RBC: 3.62 MIL/uL — ABNORMAL LOW (ref 3.87–5.11)
RDW: 15.9 % — ABNORMAL HIGH (ref 11.5–15.5)
WBC: 9.6 10*3/uL (ref 4.0–10.5)
nRBC: 0 % (ref 0.0–0.2)

## 2019-06-05 LAB — CULTURE, BLOOD (ROUTINE X 2)
Culture: NO GROWTH
Special Requests: ADEQUATE

## 2019-06-05 LAB — MAGNESIUM: Magnesium: 1.5 mg/dL — ABNORMAL LOW (ref 1.7–2.4)

## 2019-06-05 MED ORDER — LEVALBUTEROL HCL 0.63 MG/3ML IN NEBU
0.6300 mg | INHALATION_SOLUTION | Freq: Two times a day (BID) | RESPIRATORY_TRACT | Status: DC
  Administered 2019-06-05 – 2019-06-07 (×4): 0.63 mg via RESPIRATORY_TRACT
  Filled 2019-06-05 (×4): qty 3

## 2019-06-05 MED ORDER — ENSURE ENLIVE PO LIQD
237.0000 mL | Freq: Two times a day (BID) | ORAL | Status: DC
  Administered 2019-06-05 – 2019-06-07 (×3): 237 mL via ORAL

## 2019-06-05 MED ORDER — BENZONATATE 100 MG PO CAPS
200.0000 mg | ORAL_CAPSULE | Freq: Three times a day (TID) | ORAL | Status: DC
  Administered 2019-06-05 – 2019-06-07 (×8): 200 mg via ORAL
  Filled 2019-06-05 (×8): qty 2

## 2019-06-05 MED ORDER — MAGNESIUM SULFATE 4 GM/100ML IV SOLN
4.0000 g | Freq: Once | INTRAVENOUS | Status: AC
  Administered 2019-06-05: 4 g via INTRAVENOUS
  Filled 2019-06-05: qty 100

## 2019-06-05 MED ORDER — PANTOPRAZOLE SODIUM 40 MG PO TBEC
40.0000 mg | DELAYED_RELEASE_TABLET | Freq: Two times a day (BID) | ORAL | Status: DC
  Administered 2019-06-05 – 2019-06-07 (×5): 40 mg via ORAL
  Filled 2019-06-05 (×5): qty 1

## 2019-06-05 MED ORDER — IPRATROPIUM BROMIDE 0.02 % IN SOLN
0.5000 mg | Freq: Two times a day (BID) | RESPIRATORY_TRACT | Status: DC
  Administered 2019-06-05 – 2019-06-07 (×4): 0.5 mg via RESPIRATORY_TRACT
  Filled 2019-06-05 (×4): qty 2.5

## 2019-06-05 MED ORDER — POTASSIUM CHLORIDE CRYS ER 20 MEQ PO TBCR
40.0000 meq | EXTENDED_RELEASE_TABLET | ORAL | Status: AC
  Administered 2019-06-05 (×2): 40 meq via ORAL
  Filled 2019-06-05 (×2): qty 2

## 2019-06-05 NOTE — Progress Notes (Signed)
Initial Nutrition Assessment  INTERVENTION:   -Ensure Enlive po BID, each supplement provides 350 kcal and 20 grams of protein  NUTRITION DIAGNOSIS:   Increased nutrient needs related to acute illness as evidenced by estimated needs.  GOAL:   Patient will meet greater than or equal to 90% of their needs  MONITOR:   PO intake, Supplement acceptance, Labs, Weight trends, I & O's  REASON FOR ASSESSMENT:   Consult Assessment of nutrition requirement/status  ASSESSMENT:   73 year old female, lives with her daughter and grandchildren, ambulates with the help of a cane, PMH of sarcoidosis on chronic prednisone, associated ILD and pulmonary hypertension, former tobacco abuse, COPD, GERD, HTN, blind in left eye, presented on 10/20 due to generalized weakness, bilateral leg edema, diarrhea and altered mental status.  Admitted to ICU by CCM due to shock  **RD working remotely**  Patient admitted 10/20 for AMS, weakness and diarrhea. Pt has poor appetite and intakes. Pt's diarrhea has now resolved. Per SLP evaluation today, pt with mild-moderate dysphagia, recommends dysphagia 3 diet. Will monitor for PO intakes. Would likely benefit from protein supplements, will order Ensure.  Per weight records, pt's weight has increased since March 2020.   Medications: KLOR-CON, Mg sulfate Labs reviewed: Low K, Mg  NUTRITION - FOCUSED PHYSICAL EXAM:  Unable to perform- working remotely.  Diet Order:   Diet Order            DIET DYS 3 Room service appropriate? Yes with Assist; Fluid consistency: Thin  Diet effective now              EDUCATION NEEDS:   No education needs have been identified at this time  Skin:  Skin Assessment: Reviewed RN Assessment  Last BM:  10/24 -type 5  Height:   Ht Readings from Last 1 Encounters:  06/01/19 5\' 5"  (1.651 m)    Weight:   Wt Readings from Last 1 Encounters:  06/05/19 56.2 kg    Ideal Body Weight:  56.8 kg  BMI:  Body mass index is  20.62 kg/m.  Estimated Nutritional Needs:   Kcal:  1400-1600  Protein:  65-75g  Fluid:  1.6L/day  Clayton Bibles, MS, RD, LDN Inpatient Clinical Dietitian Pager: [phone] After Hours Pager: [phone]

## 2019-06-05 NOTE — Progress Notes (Signed)
Pharmacy Antibiotic Note  [name] is a 73 y.o. female admitted on 05/31/2019 with sepsis.  Pharmacy has been consulted for cefepime dosing.  06/05/2019 Scr 0.39, CrCl ~ 55.94mls/min WBC 9.6 afebrile  Plan: continue Cefepime 2gm IV q12h Follow renal function, cultures and clinical course    Height: 5\' 5"  (165.1 cm) Weight: 123 lb 14.4 oz (56.2 kg) IBW/kg (Calculated) : 57  Temp (24hrs), Avg:98.2 F (36.8 C), Min:98 F (36.7 C), Max:98.4 F (36.9 C)  Recent Labs  Lab 05/31/19 1819  06/01/19 0035 06/01/19 0523 06/01/19 1530 06/02/19 0500 06/03/19 0410 06/04/19 0245 06/05/19 0500  WBC  --    < >  --  16.1*  --  13.7* 9.1 7.6 9.6  CREATININE  --    < >  --  1.82* 1.09* 1.20* 0.88 0.68 0.39*  LATICACIDVEN 4.3*  --  3.1*  --   --   --   --   --   --    < > = values in this interval not displayed.    Estimated Creatinine Clearance: 55.6 mL/min (A) (by C-G formula based on SCr of 0.39 mg/dL (L)).    Allergies  Allergen Reactions  . Ace Inhibitors Swelling  . Lisinopril Swelling    REACTION: Angioedema  . Valsartan Swelling    REACTION: angioedema.  Pt should not get ACEI or ARBs of any kind due to angioedema.  . Aspirin Other (See Comments)    REACTION: GI ulcer.  . Penicillins     Childhood allergy Has patient had a PCN reaction causing immediate rash, facial/tongue/throat swelling, SOB or lightheadedness with hypotension: Unknown Has patient had a PCN reaction causing severe rash involving mucus membranes or skin necrosis: Unknown Has patient had a PCN reaction that required hospitalization: Unknown Has patient had a PCN reaction occurring within the last 10 years: Unknown If all of the above answers are "NO", then may proceed with Cephalosporin use.     Antimicrobials this admission: Vancomycin 05/31/2019 >> 23 Cefepime 06/01/2019 >>  Levofloxacin 05/31/2019 x1  Dose adjustments this admission: -  Microbiology results: 10/21 BCx: ngtd 10/21  UCx: multiple species present, suggest recollection 10/20 MRSA PCR: neg  Thank you for allowing pharmacy to be a part of this patient's care.  Dolly Rias RPh 06/05/2019, 7:38 AM Pager [phone]

## 2019-06-05 NOTE — Evaluation (Signed)
Clinical/Bedside Swallow Evaluation Patient Details  Name: [name] MRN: [id number] Date of Birth: 1946/01/19  Today's Date: 06/05/2019 Time: SLP Start Time (ACUTE ONLY): 69 SLP Stop Time (ACUTE ONLY): 1300 SLP Time Calculation (min) (ACUTE ONLY): 20 min  Past Medical History:  Past Medical History:  Diagnosis Date  . Blind left eye   . Cataract of both eyes 01/18/2009   Right surgically repaired  . 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  . Depression 06/08/2007  . Essential hypertension, benign 06/08/2007  . GERD 01/18/2009  . Osteoporosis 12/31/2012   Left femur had T score -2.5 on DEXA scan 02/26/2009.   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 Sarcoidosis 02/05/2009   PFTs 03/30/2009: FVC 67%, FEV1 48%, FEV1/FVC 52%, TLC 104%, DLCO 71%.   Past Surgical History:  Past Surgical History:  Procedure Laterality Date  . CATARACT EXTRACTION Right   . COLONOSCOPY  2002  . HIP ARTHROPLASTY Right   . TUBAL LIGATION     HPI:  73 year old female, lives with her daughter and grandchildren, ambulates with the help of a cane, PMH of sarcoidosis on chronic prednisone, associated ILD and pulmonary hypertension, former tobacco abuse, COPD, GERD, HTN, blind in left eye, presented on 10/20 due to generalized weakness, bilateral leg edema, diarrhea and altered mental status.  Admitted to ICU by CCM due to shock-multifactorial due to right heart failure versus sepsis versus hypovolemia.  Stabilized and transferred to [facility] on 10/24.    Assessment / Plan / Recommendation Clinical Impression  Pt was seen for a bedside swallow evaluation and she presents with mild-moderate oral dysphagia.  RN reported poor po intake and pt stated that she has a hx of decreased appetite with subsequent decreased po intake.  Pt was observed with trials of thin liquid and limited trials of puree, soft solids, and regular solids  from lunch meal tray.  Pt exhibited significantly prolonged mastication of regular solids which improved with soft solids; however, pt was observed to have mild oral residue that required multiple liquid washes to clear with both regular and soft solid trials.  Bolus formation and AP transport were timely with thin liquid and puree trials.  No clinical s/sx of aspiration were observed with any trials evaluated.  Recommend diet change to Dysphagia 3 (soft) solids and continuation of thin liquids with the following precautions: 1) Small bites/sips 2) Slow rate of intake 3) Sit upright 90 degrees 4) Alternate solids and liquids.    SLP Visit Diagnosis: Dysphagia, oral phase (R13.11)    Aspiration Risk  Mild aspiration risk    Diet Recommendation Dysphagia 3 (Mech soft);Thin liquid   Liquid Administration via: Cup;Straw Medication Administration: Whole meds with liquid Supervision: Staff to assist with self feeding;Full supervision/cueing for compensatory strategies Compensations: Minimize environmental distractions;Slow rate;Small sips/bites Postural Changes: Seated upright at 90 degrees    Other  Recommendations Oral Care Recommendations: Oral care BID   Follow up Recommendations 24 hour supervision/assistance      Frequency and Duration min 2x/week  2 weeks       Prognosis Prognosis for Safe Diet Advancement: Good      Swallow Study   General HPI: 73 year old female, lives with her daughter and grandchildren, ambulates with the help of a cane, PMH of sarcoidosis on chronic prednisone, associated ILD and pulmonary hypertension, former tobacco abuse, COPD, GERD, HTN, blind in left eye, presented on 10/20  due to generalized weakness, bilateral leg edema, diarrhea and altered mental status.  Admitted to ICU by CCM due to shock-multifactorial due to right heart failure versus sepsis versus hypovolemia.  Stabilized and transferred to [facility] on 10/24.  Type of Study: Bedside Swallow  Evaluation Previous Swallow Assessment: None  Diet Prior to this Study: Regular;Thin liquids Temperature Spikes Noted: No Respiratory Status: Nasal cannula History of Recent Intubation: No Behavior/Cognition: Alert;Cooperative;Pleasant mood Oral Cavity Assessment: Within Functional Limits Oral Care Completed by SLP: No Oral Cavity - Dentition: Dentures, top;Missing dentition Vision: Functional for self-feeding Self-Feeding Abilities: Needs set up;Needs assist Patient Positioning: Upright in bed Baseline Vocal Quality: Normal Volitional Swallow: Able to elicit    Oral/Motor/Sensory Function Overall Oral Motor/Sensory Function: Within functional limits   Ice Chips Ice chips: Not tested   Thin Liquid Thin Liquid: Within functional limits Presentation: Cup;Straw    Nectar Thick Nectar Thick Liquid: Not tested   Honey Thick Honey Thick Liquid: Not tested   Puree Puree: Within functional limits Presentation: Spoon   Solid     Solid: Impaired Presentation: Spoon Oral Phase Impairments: Impaired mastication Oral Phase Functional Implications: Impaired mastication;Oral residue     [name], M.S., [place] Office: [phone]  Elvia Collum Kmya Placide 06/05/2019,2:03 PM

## 2019-06-05 NOTE — Progress Notes (Signed)
PROGRESS NOTE   [name]  [id number]    DOB: 11-23-1945    DOA: 05/31/2019  PCP: Isaac Bliss, Rayford Halsted, MD   I have briefly reviewed patients previous medical records in [facility].  Chief Complaint  Patient presents with  . Altered Mental Status  . Leg Swelling    Brief Narrative:  73 year old female, lives with her daughter and grandchildren, ambulates with the help of a cane, PMH of sarcoidosis on chronic prednisone, associated ILD and pulmonary hypertension, former tobacco abuse, COPD, GERD, HTN, blind in left eye, presented on 10/20 due to generalized weakness, bilateral leg edema, diarrhea and altered mental status.  Admitted to ICU by CCM due to shock-multifactorial due to right heart failure versus sepsis versus hypovolemia.  Stabilized and transferred to [facility] on 10/24.  Slowly improving.   Assessment & Plan:   Active Problems:   Encounter for central line placement   Severe sepsis ([place])   Shock ([place])   Central venous catheter in place   AKI (acute kidney injury) ([place])   Prolonged QT interval   Pulmonary hypertension (HCC)   Hyponatremia   Hypoglycemia   Encephalopathy acute   Acute respiratory failure with hypoxia (HCC)   Shock  Suspected multifactorial due to right heart failure versus sepsis versus hypovolemia in the context of relative adrenal insufficiency in patient on chronic steroids.  Off of pressors.  Now starting to be hypertensive.  Resume home dose of amlodipine and Toprol-XL.  TTE 10/21: LVEF 55-60%.  Mildly reduced right ventricular systolic function.  Severe TR RVSP 55.6.  Possible sepsis  Infectious process suspected although not confirmed on culture.  Elevated PCT only.  PCT trending down.  Urine culture negative.  Blood cultures x2: Negative to date.  RSV panel negative.  SARS coronavirus 2 testing negative.  GI panel PCR negative.  On presentation had diarrhea, hypotension, elevated lactate.  Per PCCM, vancomycin  discontinued, complete total 1 week course of cefepime.  Although some concern for pneumonia on admission (opacity in the right perihilar region and left lung base) rapid clearance on chest x-ray suggests volume rather than infection.  Sarcoidosis  As per pulmonology, although MAR indicates prednisone 20 mg twice daily at home, she reportedly was taking once daily.  Now on prednisone taper, eventually to be reduced to 20 mg daily until outpatient follow-up with pulmonology.  Pulmonology will arrange outpatient follow-up for further tapering of steroids.  Chest x-ray 10/23 shows chronic fibrotic changes and calcified adenopathy, stable from prior.  COPD with mild exacerbation.  Still has some wheezing today but better than yesterday.  Already on oral prednisone and IV cefepime, continue.  Changed albuterol/DuoNeb nebulizations to Xopenex and Atrovent due to sinus tachycardia.  Flutter valve and incentive spirometry added.  Speech therapy evaluation requested to rule out aspiration.  Added scheduled Tessalon for intermittent hacking cough.  Former tobacco abuse  Acute respiratory failure with hypoxia  Not on home oxygen PTA.  Wean oxygen to saturations between 89-92%.  It appears that she desaturated to 88-89% this morning when wean down to room air 1 L/min oxygen.  Hypoglycemia/hypokalemia/hypomagnesemia/hyponatremia/acute kidney injury/acute metabolic encephalopathy  Replace and follow hypomagnesemia and hypokalemia.  Rest resolved.  Deconditioning  PT recommends home health PT, 24/7 supervision and assistance, rolling walker with 5 inch wheels and 3 and 1.  Severe TR/pulmonary hypertension  Diarrhea  Suspect improved or even resolved.  1 BM documented all day yesterday.  GI panel negative.  Low index of suspicion for  C. difficile.  Appears to have resolved.  Sinus tachycardia  Unclear etiology.  May be related to mild dyspnea and COPD exacerbation.   Changed BD's to Xopenex as above.  Initiated prior home dose of Toprol-XL 50 mg daily which should help.  Better, now ST in the 100s/110s.  Essential hypertension  Continue home dose of amlodipine and Toprol-XL.  Mildly uncontrolled.  Atypical chest pain  Noted overnight 10/23.  EKG without acute findings.  HS troponin 40, not significant.  Resolved without recurrence.  Malnutrition  Consulted dietitian  Normocytic anemia, undefined type  May be chronic disease.  Stable.  Mild thrombocytopenia  Resolved.   DVT prophylaxis: Subcutaneous heparin Code Status: Full Family Communication: None at bedside Disposition: To be determined pending clinical improvement.  Due to some wheezing and at risk for respiratory decompensation, continue management in stepdown unit for additional 24 hours.   Consultants:  PCCM signed off 10/23.  Procedures:  Right IJ central line.  Antimicrobials:  IV cefepime 10/20 > IV levofloxacin x1 dose 10/20 IV vancomycin 10/20 > 10/22.   Subjective: Dyspnea better compared to yesterday but still has some wheezing and breathing not yet at baseline.  Has period's of dry hacking cough.  No chest pain.  No diarrhea.  Objective:  Vitals:   06/05/19 0500 06/05/19 0600 06/05/19 0700 06/05/19 0800  BP: (!) 136/92 (!) 148/82 (!) 146/89 134/84  Pulse: (!) 109 (!) 113 (!) 114 (!) 106  Resp: (!) 21 (!) 22 20 20   Temp:   98.3 F (36.8 C)   TempSrc:   Oral   SpO2: 95% 100% 99% 97%  Weight:      Height:        Examination:  General exam: Elderly female, moderately built and frail, chronically ill looking lying comfortably propped up in bed.  Oral mucosa moist. Respiratory system: Slightly improved compared to yesterday.  Still harsh bilaterally and occasional expiratory rhonchi.  No crackles.  No increased work of breathing. Cardiovascular system: S1 and S2 heard, regular mild tachycardia.  No JVD, murmurs or pedal edema.  Telemetry personally  reviewed: ST in the 100s/110s. Gastrointestinal system: Abdomen is nondistended, soft and nontender. No organomegaly or masses felt. Normal bowel sounds heard. Central nervous system: Alert and oriented. No focal neurological deficits. Extremities: Symmetric 5 x 5 power. Skin: No rashes, lesions or ulcers Psychiatry: Judgement and insight appear normal. Mood & affect appropriate. Neck: Right IJ CVL.    Data Reviewed: I have personally reviewed following labs and imaging studies  CBC: Recent Labs  Lab 05/31/19 1821 06/01/19 0523 06/02/19 0500 06/03/19 0410 06/04/19 0245 06/05/19 0500  WBC 9.2 16.1* 13.7* 9.1 7.6 9.6  NEUTROABS 7.8*  --   --   --   --   --   HGB 10.0* 10.4* 10.7* 9.1* 9.7* 10.3*  HCT 30.0* 29.7* 30.3* 25.8* 28.0* 30.4*  MCV 86.5 82.7 81.9 82.7 83.3 84.0  PLT 146* 176 189 128* 142* 0000000   Basic Metabolic Panel: Recent Labs  Lab 06/01/19 0523 06/01/19 1530 06/02/19 0500 06/03/19 0410 06/04/19 0245 06/05/19 0500  NA 125* 132* 132* 135 137 136  K 4.0 3.0* 5.0 4.2 3.6 3.0*  CL 90* 104 97* 101 101 96*  CO2 25 19* 26 25 27 31   GLUCOSE 90 98 139* 102* 107* 83  BUN 31* 22 25* 18 15 12   CREATININE 1.82* 1.09* 1.20* 0.88 0.68 0.39*  CALCIUM 7.1* 5.5* 7.4* 7.7* 8.1* 8.4*  MG 3.5*  --  3.0*  --   --  1.5*  PHOS 3.9  --   --   --   --   --    Liver Function Tests: Recent Labs  Lab 05/31/19 1821 06/01/19 0523 06/05/19 0500  AST 38 48* 75*  ALT 19 23 46*  ALKPHOS 134* 137* 282*  BILITOT 1.1 1.5* 1.4*  PROT 4.8* 5.2* 5.4*  ALBUMIN 2.0* 2.1* 2.4*    Cardiac Enzymes: No results for input(s): CKTOTAL, CKMB, CKMBINDEX, TROPONINI in the last 168 hours.  CBG: No results for input(s): GLUCAP in the last 168 hours.  Recent Results (from the past 240 hour(s))  Culture, blood (Routine X 2) w Reflex to ID Panel     Status: None (Preliminary result)   Collection Time: 05/31/19  6:20 PM   Specimen: BLOOD LEFT FOREARM  Result Value Ref Range Status   Specimen  Description   Final    BLOOD LEFT FOREARM Performed at [facility] [address] [place], [place] [address]    Special Requests   Final    BOTTLES DRAWN AEROBIC AND ANAEROBIC Blood Culture adequate volume Performed at [facility] [address] [place], [place] [address]    Culture   Final    NO GROWTH 4 DAYS Performed at [facility], [place] [address], [place], [place] [address]    Report Status PENDING  Incomplete  SARS Coronavirus 2 by RT PCR (hospital order, performed in [facility] hospital lab) Nasopharyngeal Nasopharyngeal Swab     Status: None   Collection Time: 05/31/19  6:35 PM   Specimen: Nasopharyngeal Swab  Result Value Ref Range Status   SARS Coronavirus 2 NEGATIVE NEGATIVE Final    Comment: (NOTE) If result is NEGATIVE SARS-CoV-2 target nucleic acids are NOT DETECTED. The SARS-CoV-2 RNA is generally detectable in upper and lower  respiratory specimens during the acute phase of infection. The lowest  concentration of SARS-CoV-2 viral copies this assay can detect is 250  copies / mL. A negative result does not preclude SARS-CoV-2 infection  and should not be used as the sole basis for treatment or other  patient management decisions.  A negative result may occur with  improper specimen collection / handling, submission of specimen other  than nasopharyngeal swab, presence of viral mutation(s) within the  areas targeted by this assay, and inadequate number of viral copies  (<250 copies / mL). A negative result must be combined with clinical  observations, patient history, and epidemiological information. If result is POSITIVE SARS-CoV-2 target nucleic acids are DETECTED. The SARS-CoV-2 RNA is generally detectable in upper and lower  respiratory specimens dur ing the acute phase of infection.  Positive  results are indicative of active infection with SARS-CoV-2.  Clinical  correlation with patient history and other  diagnostic information is  necessary to determine patient infection status.  Positive results do  not rule out bacterial infection or co-infection with other viruses. If result is PRESUMPTIVE POSTIVE SARS-CoV-2 nucleic acids MAY BE PRESENT.   A presumptive positive result was obtained on the submitted specimen  and confirmed on repeat testing.  While 2019 novel coronavirus  (SARS-CoV-2) nucleic acids may be present in the submitted sample  additional confirmatory testing may be necessary for epidemiological  and / or clinical management purposes  to differentiate between  SARS-CoV-2 and other Sarbecovirus currently known to infect humans.  If clinically indicated additional testing with an alternate test  methodology [phone]) is advised. The SARS-CoV-2 RNA is generally  detectable in upper  and lower respiratory sp ecimens during the acute  phase of infection. The expected result is Negative. Fact Sheet for Patients:  StrictlyIdeas.no Fact Sheet for Healthcare Providers: BankingDealers.co.za This test is not yet approved or cleared by the [place] FDA and has been authorized for detection and/or diagnosis of SARS-CoV-2 by FDA under an Emergency Use Authorization (EUA).  This EUA will remain in effect (meaning this test can be used) for the duration of the COVID-19 declaration under Section 564(b)(1) of the Act, 21 U.S.C. section 360bbb-3(b)(1), unless the authorization is terminated or revoked sooner. Performed at [facility], [place] [address], [place], [place] [address]   MRSA PCR Screening     Status: None   Collection Time: 05/31/19 11:46 PM   Specimen: Nasopharyngeal  Result Value Ref Range Status   MRSA by PCR NEGATIVE NEGATIVE Final    Comment:        The GeneXpert MRSA Assay (FDA approved for NASAL specimens only), is one component of a comprehensive MRSA colonization surveillance program. It is not  intended to diagnose MRSA infection nor to guide or monitor treatment for MRSA infections. Performed at [facility], [place] [address] [place], [place] [address]   Urine culture     Status: Abnormal   Collection Time: 06/01/19  1:16 AM   Specimen: Urine, Random  Result Value Ref Range Status   Specimen Description   Final    URINE, RANDOM Performed at [facility] [address], [place], [place] [address]    Special Requests   Final    NONE Performed at [facility], [place] [address] [place], [place] [address]    Culture MULTIPLE SPECIES PRESENT, SUGGEST RECOLLECTION (A)  Final   Report Status 06/01/2019 FINAL  Final  Culture, blood (Routine X 2) w Reflex to ID Panel     Status: None (Preliminary result)   Collection Time: 06/01/19  2:22 AM   Specimen: BLOOD  Result Value Ref Range Status   Specimen Description   Final    BLOOD LEFT ARM Performed at [facility] [address], [place], [place] [address]    Special Requests   Final    BOTTLES DRAWN AEROBIC ONLY Blood Culture adequate volume Performed at [facility] [address], [place], [place] [address]    Culture   Final    NO GROWTH 3 DAYS Performed at [facility], [place] [address], [place],  [address]    Report Status PENDING  Incomplete  Respiratory Panel by PCR     Status: None   Collection Time: 06/01/19  2:45 AM   Specimen: Respiratory  Result Value Ref Range Status   Adenovirus NOT DETECTED NOT DETECTED Final   Coronavirus 229E NOT DETECTED NOT DETECTED Final    Comment: (NOTE) The Coronavirus on the Respiratory Panel, DOES NOT test for the novel  Coronavirus (2019 nCoV)    Coronavirus HKU1 NOT DETECTED NOT DETECTED Final   Coronavirus NL63 NOT DETECTED NOT DETECTED Final   Coronavirus OC43 NOT DETECTED NOT DETECTED Final   Metapneumovirus NOT DETECTED NOT DETECTED Final   Rhinovirus /  Enterovirus NOT DETECTED NOT DETECTED Final   Influenza A NOT DETECTED NOT DETECTED Final   Influenza B NOT DETECTED NOT DETECTED Final   Parainfluenza Virus 1 NOT DETECTED NOT DETECTED Final   Parainfluenza Virus 2 NOT DETECTED NOT DETECTED Final   Parainfluenza Virus 3 NOT DETECTED NOT DETECTED Final  Parainfluenza Virus 4 NOT DETECTED NOT DETECTED Final   Respiratory Syncytial Virus NOT DETECTED NOT DETECTED Final   Bordetella pertussis NOT DETECTED NOT DETECTED Final   Chlamydophila pneumoniae NOT DETECTED NOT DETECTED Final   Mycoplasma pneumoniae NOT DETECTED NOT DETECTED Final    Comment: Performed at [facility], [place] [address] [place], [place] [address]  GI pathogen panel by PCR, stool     Status: None   Collection Time: 06/01/19  2:45 AM   Specimen: Stool  Result Value Ref Range Status   Plesiomonas shigelloides NOT DETECTED NOT DETECTED Final   Yersinia enterocolitica NOT DETECTED NOT DETECTED Final   Vibrio NOT DETECTED NOT DETECTED Final   Enteropathogenic E coli NOT DETECTED NOT DETECTED Final   E coli (ETEC) LT/ST NOT DETECTED NOT DETECTED Final   E coli [id number] by PCR Not applicable NOT DETECTED Final   Cryptosporidium by PCR NOT DETECTED NOT DETECTED Final   Entamoeba histolytica NOT DETECTED NOT DETECTED Final   Adenovirus F 40/41 NOT DETECTED NOT DETECTED Final   Norovirus GI/GII NOT DETECTED NOT DETECTED Final   Sapovirus NOT DETECTED NOT DETECTED Final    Comment: (NOTE) Performed At: [facility] [place], [place] [id number] Rush Farmer MD [id number]    Vibrio cholerae NOT DETECTED NOT DETECTED Final   Campylobacter by PCR NOT DETECTED NOT DETECTED Final   Salmonella by PCR NOT DETECTED NOT DETECTED Final   E coli (STEC) NOT DETECTED NOT DETECTED Final   Enteroaggregative E coli NOT DETECTED NOT DETECTED Final   Shigella by PCR NOT DETECTED NOT DETECTED Final   Cyclospora cayetanensis NOT DETECTED NOT DETECTED Final    Astrovirus NOT DETECTED NOT DETECTED Final   G lamblia by PCR NOT DETECTED NOT DETECTED Final   Rotavirus A by PCR NOT DETECTED NOT DETECTED Final  Culture, Urine     Status: None   Collection Time: 06/02/19  6:30 PM   Specimen: Urine, Random  Result Value Ref Range Status   Specimen Description   Final    URINE, RANDOM Performed at [facility], [place] [address], [place], [place] [address]    Special Requests   Final    NONE Performed at [facility], [place] [address], [place], [place] [address]    Culture   Final    NO GROWTH Performed at [facility], [place] [address], [place],  [address]    Report Status 06/03/2019 FINAL  Final         Radiology Studies: No results found.      Scheduled Meds: . amLODipine  5 mg Oral Daily  . benzonatate  200 mg Oral TID  . Chlorhexidine Gluconate Cloth  6 each Topical Daily  . gabapentin  100 mg Oral QHS  . heparin  5,000 Units Subcutaneous Q8H  . ipratropium  0.5 mg Nebulization Q6H  . levalbuterol  0.63 mg Nebulization Q6H  . metoprolol succinate  50 mg Oral Daily  . pantoprazole  40 mg Oral BID AC  . potassium chloride  40 mEq Oral Q4H  . pravastatin  40 mg Oral q1800  . [START ON 06/06/2019] predniSONE  30 mg Oral Q breakfast   Followed by  . [START ON 06/09/2019] predniSONE  20 mg Oral Q breakfast  . sodium chloride flush  10-40 mL Intracatheter Q12H   Continuous Infusions: . sodium chloride 10 mL/hr at 06/03/19 0600  . ceFEPime (MAXIPIME) IV Stopped (06/04/19 2254)  .  magnesium sulfate bolus IVPB       LOS: 5 days     Vernell Leep, MD, FACP, [facility]. [facility]  To contact the attending provider between 7A-7P or the covering provider during after hours 7P-7A, please log into the web site [email or web address] and access using universal [place] password for that web site. If you do not have the password, please call the hospital operator.  06/05/2019, 11:11 AM

## 2019-06-06 LAB — BASIC METABOLIC PANEL
Anion gap: 10 (ref 5–15)
BUN: 12 mg/dL (ref 8–23)
CO2: 28 mmol/L (ref 22–32)
Calcium: 8.2 mg/dL — ABNORMAL LOW (ref 8.9–10.3)
Chloride: 95 mmol/L — ABNORMAL LOW (ref 98–111)
Creatinine, Ser: 0.65 mg/dL (ref 0.44–1.00)
GFR calc Af Amer: >60 mL/min (ref >60)
GFR calc non Af Amer: >60 mL/min (ref >60)
Glucose, Bld: 108 mg/dL — ABNORMAL HIGH (ref 70–99)
Potassium: 4 mmol/L (ref 3.5–5.1)
Sodium: 133 mmol/L — ABNORMAL LOW (ref 135–145)

## 2019-06-06 LAB — CULTURE, BLOOD (ROUTINE X 2)
Culture: NO GROWTH
Special Requests: ADEQUATE

## 2019-06-06 LAB — MAGNESIUM: Magnesium: 1.8 mg/dL (ref 1.7–2.4)

## 2019-06-06 NOTE — Progress Notes (Signed)
Physical Therapy Treatment Patient Details Name: [name] MRN: [id number] DOB: 1946/07/20 Today's Date: 06/06/2019    History of Present Illness 73 year old woman with PMHx of sarcoidosis, COPD, blind left eye and admitted with weakness, swelling, diarrhea, AMS.  Pt also hypotensive on presentation and requried Levophed    PT Comments    Pt assisted with ambulating however only tolerated short distance.  Pt prefers d/c home.  If 24/7 assist not available, then pt may benefit from SNF.  Pt slowly progressing.  Pt encouraged to remain OOB and in recliner end of session.    Follow Up Recommendations  Home health PT;Supervision/Assistance - 24 hour     Equipment Recommendations  Rolling walker with 5" wheels;3in1 (PT)    Recommendations for Other Services       Precautions / Restrictions Precautions Precautions: Fall    Mobility  Bed Mobility Overal bed mobility: Needs Assistance Bed Mobility: Supine to Sit     Supine to sit: Min guard     General bed mobility comments: min/guard for lines  Transfers Overall transfer level: Needs assistance Equipment used: Rolling walker (2 wheeled) Transfers: Sit to/from Stand Sit to Stand: Min assist;+2 safety/equipment         General transfer comment: assist to rise and steady, cues for controlling descent  Ambulation/Gait Ambulation/Gait assistance: Min assist;+2 safety/equipment Gait Distance (Feet): 17 Feet Assistive device: Rolling walker (2 wheeled) Gait Pattern/deviations: Step-through pattern;Decreased stride length     General Gait Details: verbla cues for safe use of RW, SPO2 remained 96% on room air, pt fatigued quickly and followed with recliner   Stairs             Wheelchair Mobility    Modified Rankin (Stroke Patients Only)       Balance Overall balance assessment: Needs assistance         Standing balance support: Bilateral upper extremity supported Standing balance-Leahy Scale:  Poor                              Cognition Arousal/Alertness: Awake/alert Behavior During Therapy: WFL for tasks assessed/performed Overall Cognitive Status: Within Functional Limits for tasks assessed                                        Exercises      General Comments        Pertinent Vitals/Pain Pain Assessment: No/denies pain    Home Living                      Prior Function            PT Goals (current goals can now be found in the care plan section) Progress towards PT goals: Progressing toward goals    Frequency    Min 3X/week      PT Plan Current plan remains appropriate    Co-evaluation              AM-PAC PT "6 Clicks" Mobility   Outcome Measure  Help needed turning from your back to your side while in a flat bed without using bedrails?: A Little Help needed moving from lying on your back to sitting on the side of a flat bed without using bedrails?: A Little Help needed moving to and from a bed to a chair (including a  wheelchair)?: A Little Help needed standing up from a chair using your arms (e.g., wheelchair or bedside chair)?: A Little Help needed to walk in hospital room?: A Little Help needed climbing 3-5 steps with a railing? : A Lot 6 Click Score: 17    End of Session Equipment Utilized During Treatment: Gait belt Activity Tolerance: Patient limited by fatigue Patient left: in chair;with call bell/phone within reach;with chair alarm set   PT Visit Diagnosis: Muscle weakness (generalized) (M62.81);Difficulty in walking, not elsewhere classified (R26.2)     Time: [id number] PT Time Calculation (min) (ACUTE ONLY): 16 min  Charges:  $Gait Training: 8-22 mins                    Carmelia Bake, PT, DPT Acute Rehabilitation Services Office: [phone] Pager: [phone]  Trena Platt 06/06/2019, 4:17 PM

## 2019-06-06 NOTE — Progress Notes (Signed)
PROGRESS NOTE   [name]  [id number]    DOB: [birth year]    DOA: 05/31/2019  PCP: Isaac Bliss, Rayford Halsted, MD   I have briefly reviewed patients previous medical records in [facility].  Chief Complaint  Patient presents with  . Altered Mental Status  . Leg Swelling    Brief Narrative:  73 year old female, lives with her daughter and grandchildren, ambulates with the help of a cane, PMH of sarcoidosis on chronic prednisone, associated ILD and pulmonary hypertension, former tobacco abuse, COPD, GERD, HTN, blind in left eye, presented on 10/20 due to generalized weakness, bilateral leg edema, diarrhea and altered mental status.  Admitted to ICU by CCM due to shock-multifactorial due to right heart failure versus sepsis versus hypovolemia.  Stabilized and transferred to [facility] on 10/24.  Slowly improving.  Transferred from stepdown unit to telemetry on 10/26.  Possible discharge home in the next 1 to 2 days pending clinical improvement.   Assessment & Plan:   Active Problems:   Encounter for central line placement   Severe sepsis ([place])   Shock ([place])   Central venous catheter in place   AKI (acute kidney injury) ([place])   Prolonged QT interval   Pulmonary hypertension (HCC)   Hyponatremia   Hypoglycemia   Encephalopathy acute   Acute respiratory failure with hypoxia (HCC)   Shock  Suspected multifactorial due to right heart failure versus sepsis versus hypovolemia in the context of relative adrenal insufficiency in patient on chronic steroids.  Off of pressors.  Resolved.  TTE 10/21: LVEF 55-60%.  Mildly reduced right ventricular systolic function.  Severe TR RVSP 55.6.  Possible sepsis  Infectious process suspected although not confirmed on culture.  Elevated PCT only.  PCT trending down.  Urine culture negative.  Blood cultures x2: Negative to date.  RSV panel negative.  SARS coronavirus 2 testing negative.  GI panel PCR negative.  On presentation had  diarrhea, hypotension, elevated lactate.  Per PCCM, vancomycin discontinued, complete total 1 week course of cefepime.  Although some concern for pneumonia on admission (opacity in the right perihilar region and left lung base) rapid clearance on chest x-ray suggests volume rather than infection.  Sarcoidosis  As per pulmonology, although MAR indicates prednisone 20 mg twice daily at home, she reportedly was taking once daily.  Now on prednisone taper, eventually to be reduced to 20 mg daily until outpatient follow-up with pulmonology.  Pulmonology will arrange outpatient follow-up for further tapering of steroids.  Chest x-ray 10/23 shows chronic fibrotic changes and calcified adenopathy, stable from prior.  COPD with mild exacerbation.  Already on oral prednisone and IV cefepime, continue.  Changed albuterol/DuoNeb nebulizations to Xopenex and Atrovent due to sinus tachycardia.  Flutter valve and incentive spirometry added.  Speech therapy evaluated and recommend modified diet.  Added scheduled Tessalon for intermittent hacking cough.  Slowly improving.  Dysphagia  Speech therapy evaluated and recommend dysphagia 3 diet and thin liquids.  Seems to be tolerating this better.  Former tobacco abuse  Acute respiratory failure with hypoxia  Not on home oxygen PTA.  Wean oxygen to saturations between 89-92%.  Currently appears to be saturating at 95% on 0.5 L/min O2.  Will need to reassess home oxygen requirement prior to discharge.  Hypoglycemia/hypokalemia/hypomagnesemia/hyponatremia/acute kidney injury/acute metabolic encephalopathy  Resolved.  Periodically follow BMP.  Deconditioning  PT recommends home health PT, 24/7 supervision and assistance, rolling walker with 5 inch wheels and 3 and 1.  Severe TR/pulmonary hypertension  Diarrhea  GI panel negative.  Low index of suspicion for C. difficile.  Appears to have resolved.  Sinus tachycardia  Unclear  etiology.  May be related to mild dyspnea and COPD exacerbation.  Changed BD's to Xopenex as above.  Initiated prior home dose of Toprol-XL 50 mg daily which should help.  Improved.  Now mostly sinus rhythm in the 90s-mild ST in the 100s.  Essential hypertension  Continue home dose of amlodipine and Toprol-XL.  Mildly uncontrolled.  No change in regimen today.  Atypical chest pain  Noted overnight 10/23.  EKG without acute findings.  HS troponin 40, not significant.  Resolved without recurrence.  Nutritional status  Dietitian input appreciated.  Increased nutritional needs related to acute illness.  Follow recommendations.  Normocytic anemia, undefined type  May be chronic disease.  Stable.  Mild thrombocytopenia  Resolved.   DVT prophylaxis: Subcutaneous heparin Code Status: Full Family Communication: None at bedside Disposition: Transfer from stepdown to telemetry bed 10/26.  DC home pending clinical improvement, hopefully in the next 1 to 2 days after completion of IV cefepime.   Consultants:  PCCM signed off 10/23.  Procedures:  Right IJ central line-discontinued 10/25.  Antimicrobials:  IV cefepime 10/20 > IV levofloxacin x1 dose 10/20 IV vancomycin 10/20 > 10/22.   Subjective: Overall slowly improving.  Still having cough spells but better compared to 2 days ago.  Dyspnea improved and breathing almost back to usual.  No wheezing.  Denies diarrhea.  No chest pain.  Objective:  Vitals:   06/06/19 0801 06/06/19 1000 06/06/19 1001 06/06/19 1200  BP: (!) 159/83 138/72 138/72 (!) 147/84  Pulse: (!) 111 (!) 112 (!) 111 (!) 109  Resp: 15 18  16   Temp:      TempSrc:      SpO2: 94% 98%  95%  Weight:      Height:        Examination:  General exam: Elderly female, moderately built and frail, chronically ill looking lying comfortably propped up in bed.  Oral mucosa moist. Respiratory system: Breath sounds continue to improve.  Occasional posterior  rhonchi.  No crackles.  Otherwise clear.  No increased work of breathing. Cardiovascular system: S1 and S2 heard, RRR.  No JVD, murmurs or pedal edema.  Telemetry personally reviewed: SR in the 90s-ST in the 100s. Gastrointestinal system: Abdomen is nondistended, soft and nontender. No organomegaly or masses felt. Normal bowel sounds heard. Central nervous system: Alert and oriented. No focal neurological deficits. Extremities: Symmetric 5 x 5 power. Skin: No rashes, lesions or ulcers Psychiatry: Judgement and insight appear normal. Mood & affect appropriate. Neck: Right IJ CVL-removed.    Data Reviewed: I have personally reviewed following labs and imaging studies  CBC: Recent Labs  Lab 05/31/19 1821 06/01/19 0523 06/02/19 0500 06/03/19 0410 06/04/19 0245 06/05/19 0500  WBC 9.2 16.1* 13.7* 9.1 7.6 9.6  NEUTROABS 7.8*  --   --   --   --   --   HGB 10.0* 10.4* 10.7* 9.1* 9.7* 10.3*  HCT 30.0* 29.7* 30.3* 25.8* 28.0* 30.4*  MCV 86.5 82.7 81.9 82.7 83.3 84.0  PLT 146* 176 189 128* 142* 0000000   Basic Metabolic Panel: Recent Labs  Lab 06/01/19 0523  06/02/19 0500 06/03/19 0410 06/04/19 0245 06/05/19 0500 06/06/19 1241  NA 125*   < > 132* 135 137 136 133*  K 4.0   < > 5.0 4.2 3.6 3.0* 4.0  CL 90*   < > 97* 101 101  96* 95*  CO2 25   < > 26 25 27 31 28   GLUCOSE 90   < > 139* 102* 107* 83 108*  BUN 31*   < > 25* 18 15 12 12   CREATININE 1.82*   < > 1.20* 0.88 0.68 0.39* 0.65  CALCIUM 7.1*   < > 7.4* 7.7* 8.1* 8.4* 8.2*  MG 3.5*  --  3.0*  --   --  1.5* 1.8  PHOS 3.9  --   --   --   --   --   --    < > = values in this interval not displayed.   Liver Function Tests: Recent Labs  Lab 05/31/19 1821 06/01/19 0523 06/05/19 0500  AST 38 48* 75*  ALT 19 23 46*  ALKPHOS 134* 137* 282*  BILITOT 1.1 1.5* 1.4*  PROT 4.8* 5.2* 5.4*  ALBUMIN 2.0* 2.1* 2.4*    Cardiac Enzymes: No results for input(s): CKTOTAL, CKMB, CKMBINDEX, TROPONINI in the last 168 hours.  CBG: No  results for input(s): GLUCAP in the last 168 hours.  Recent Results (from the past 240 hour(s))  Culture, blood (Routine X 2) w Reflex to ID Panel     Status: None   Collection Time: 05/31/19  6:20 PM   Specimen: BLOOD LEFT FOREARM  Result Value Ref Range Status   Specimen Description   Final    BLOOD LEFT FOREARM Performed at [facility] [address] [place], [place] [address]    Special Requests   Final    BOTTLES DRAWN AEROBIC AND ANAEROBIC Blood Culture adequate volume Performed at [facility] [address], [place], [place] [address]    Culture   Final    NO GROWTH 5 DAYS Performed at [facility], [place] [address] [place], [place] [address]    Report Status 06/05/2019 FINAL  Final  SARS Coronavirus 2 by RT PCR (hospital order, performed in [facility] hospital lab) Nasopharyngeal Nasopharyngeal Swab     Status: None   Collection Time: 05/31/19  6:35 PM   Specimen: Nasopharyngeal Swab  Result Value Ref Range Status   SARS Coronavirus 2 NEGATIVE NEGATIVE Final    Comment: (NOTE) If result is NEGATIVE SARS-CoV-2 target nucleic acids are NOT DETECTED. The SARS-CoV-2 RNA is generally detectable in upper and lower  respiratory specimens during the acute phase of infection. The lowest  concentration of SARS-CoV-2 viral copies this assay can detect is 250  copies / mL. A negative result does not preclude SARS-CoV-2 infection  and should not be used as the sole basis for treatment or other  patient management decisions.  A negative result may occur with  improper specimen collection / handling, submission of specimen other  than nasopharyngeal swab, presence of viral mutation(s) within the  areas targeted by this assay, and inadequate number of viral copies  (<250 copies / mL). A negative result must be combined with clinical  observations, patient history, and epidemiological information. If result is POSITIVE SARS-CoV-2 target  nucleic acids are DETECTED. The SARS-CoV-2 RNA is generally detectable in upper and lower  respiratory specimens dur ing the acute phase of infection.  Positive  results are indicative of active infection with SARS-CoV-2.  Clinical  correlation with patient history and other diagnostic information is  necessary to determine patient infection status.  Positive results do  not rule out bacterial infection or co-infection with other viruses. If result is PRESUMPTIVE POSTIVE SARS-CoV-2 nucleic acids MAY BE  PRESENT.   A presumptive positive result was obtained on the submitted specimen  and confirmed on repeat testing.  While 2019 novel coronavirus  (SARS-CoV-2) nucleic acids may be present in the submitted sample  additional confirmatory testing may be necessary for epidemiological  and / or clinical management purposes  to differentiate between  SARS-CoV-2 and other Sarbecovirus currently known to infect humans.  If clinically indicated additional testing with an alternate test  methodology [phone]) is advised. The SARS-CoV-2 RNA is generally  detectable in upper and lower respiratory sp ecimens during the acute  phase of infection. The expected result is Negative. Fact Sheet for Patients:  StrictlyIdeas.no Fact Sheet for Healthcare Providers: BankingDealers.co.za This test is not yet approved or cleared by the [place] FDA and has been authorized for detection and/or diagnosis of SARS-CoV-2 by FDA under an Emergency Use Authorization (EUA).  This EUA will remain in effect (meaning this test can be used) for the duration of the COVID-19 declaration under Section 564(b)(1) of the Act, 21 U.S.C. section 360bbb-3(b)(1), unless the authorization is terminated or revoked sooner. Performed at [facility], [place] [address], [place], [place] [address]   MRSA PCR Screening     Status: None   Collection Time: 05/31/19 11:46  PM   Specimen: Nasopharyngeal  Result Value Ref Range Status   MRSA by PCR NEGATIVE NEGATIVE Final    Comment:        The GeneXpert MRSA Assay (FDA approved for NASAL specimens only), is one component of a comprehensive MRSA colonization surveillance program. It is not intended to diagnose MRSA infection nor to guide or monitor treatment for MRSA infections. Performed at [facility], [place] [address], [place], [place] [address]   Urine culture     Status: Abnormal   Collection Time: 06/01/19  1:16 AM   Specimen: Urine, Random  Result Value Ref Range Status   Specimen Description   Final    URINE, RANDOM Performed at [facility] [address], [place], [place] [address]    Special Requests   Final    NONE Performed at [facility], [place] [address] [place], [place] [address]    Culture MULTIPLE SPECIES PRESENT, SUGGEST RECOLLECTION (A)  Final   Report Status 06/01/2019 FINAL  Final  Culture, blood (Routine X 2) w Reflex to ID Panel     Status: None   Collection Time: 06/01/19  2:22 AM   Specimen: BLOOD  Result Value Ref Range Status   Specimen Description   Final    BLOOD LEFT ARM Performed at [facility] [address], [place], [place] [address]    Special Requests   Final    BOTTLES DRAWN AEROBIC ONLY Blood Culture adequate volume Performed at [facility] [address] [place], [place] [address]    Culture   Final    NO GROWTH 5 DAYS Performed at [facility], [place] [address] [place],  [address]    Report Status 06/06/2019 FINAL  Final  Respiratory Panel by PCR     Status: None   Collection Time: 06/01/19  2:45 AM   Specimen: Respiratory  Result Value Ref Range Status   Adenovirus NOT DETECTED NOT DETECTED Final   Coronavirus 229E NOT DETECTED NOT DETECTED Final    Comment: (NOTE) The Coronavirus on the Respiratory Panel, DOES NOT test for the  novel  Coronavirus (2019 nCoV)    Coronavirus HKU1 NOT  DETECTED NOT DETECTED Final   Coronavirus NL63 NOT DETECTED NOT DETECTED Final   Coronavirus OC43 NOT DETECTED NOT DETECTED Final   Metapneumovirus NOT DETECTED NOT DETECTED Final   Rhinovirus / Enterovirus NOT DETECTED NOT DETECTED Final   Influenza A NOT DETECTED NOT DETECTED Final   Influenza B NOT DETECTED NOT DETECTED Final   Parainfluenza Virus 1 NOT DETECTED NOT DETECTED Final   Parainfluenza Virus 2 NOT DETECTED NOT DETECTED Final   Parainfluenza Virus 3 NOT DETECTED NOT DETECTED Final   Parainfluenza Virus 4 NOT DETECTED NOT DETECTED Final   Respiratory Syncytial Virus NOT DETECTED NOT DETECTED Final   Bordetella pertussis NOT DETECTED NOT DETECTED Final   Chlamydophila pneumoniae NOT DETECTED NOT DETECTED Final   Mycoplasma pneumoniae NOT DETECTED NOT DETECTED Final    Comment: Performed at [facility], [place] [address], [place], [place] [address]  GI pathogen panel by PCR, stool     Status: None   Collection Time: 06/01/19  2:45 AM   Specimen: Stool  Result Value Ref Range Status   Plesiomonas shigelloides NOT DETECTED NOT DETECTED Final   Yersinia enterocolitica NOT DETECTED NOT DETECTED Final   Vibrio NOT DETECTED NOT DETECTED Final   Enteropathogenic E coli NOT DETECTED NOT DETECTED Final   E coli (ETEC) LT/ST NOT DETECTED NOT DETECTED Final   E coli [id number] by PCR Not applicable NOT DETECTED Final   Cryptosporidium by PCR NOT DETECTED NOT DETECTED Final   Entamoeba histolytica NOT DETECTED NOT DETECTED Final   Adenovirus F 40/41 NOT DETECTED NOT DETECTED Final   Norovirus GI/GII NOT DETECTED NOT DETECTED Final   Sapovirus NOT DETECTED NOT DETECTED Final    Comment: (NOTE) Performed At: [facility] [place], [place] [id number] Rush Farmer MD [id number]    Vibrio cholerae NOT DETECTED NOT DETECTED Final   Campylobacter by PCR NOT DETECTED NOT DETECTED Final   Salmonella by PCR  NOT DETECTED NOT DETECTED Final   E coli (STEC) NOT DETECTED NOT DETECTED Final   Enteroaggregative E coli NOT DETECTED NOT DETECTED Final   Shigella by PCR NOT DETECTED NOT DETECTED Final   Cyclospora cayetanensis NOT DETECTED NOT DETECTED Final   Astrovirus NOT DETECTED NOT DETECTED Final   G lamblia by PCR NOT DETECTED NOT DETECTED Final   Rotavirus A by PCR NOT DETECTED NOT DETECTED Final  Culture, Urine     Status: None   Collection Time: 06/02/19  6:30 PM   Specimen: Urine, Random  Result Value Ref Range Status   Specimen Description   Final    URINE, RANDOM Performed at [facility], [place] [address], [place], [place] [address]    Special Requests   Final    NONE Performed at [facility], [place] [address], [place], [place] [address]    Culture   Final    NO GROWTH Performed at [facility], [place] [address], [place], [place] [address]    Report Status 06/03/2019 FINAL  Final         Radiology Studies: No results found.      Scheduled Meds: . amLODipine  5 mg Oral Daily  . benzonatate  200 mg Oral TID  . Chlorhexidine Gluconate Cloth  6 each Topical Daily  . feeding supplement (ENSURE ENLIVE)  237 mL Oral BID BM  . gabapentin  100 mg Oral QHS  . heparin  5,000 Units Subcutaneous Q8H  . ipratropium  0.5 mg Nebulization  BID  . levalbuterol  0.63 mg Nebulization BID  . metoprolol succinate  50 mg Oral Daily  . pantoprazole  40 mg Oral BID AC  . pravastatin  40 mg Oral q1800  . predniSONE  30 mg Oral Q breakfast   Followed by  . [START ON 06/09/2019] predniSONE  20 mg Oral Q breakfast  . sodium chloride flush  10-40 mL Intracatheter Q12H   Continuous Infusions: . sodium chloride 10 mL/hr at 06/03/19 0600  . ceFEPime (MAXIPIME) IV Stopped (06/06/19 1035)     LOS: 6 days     Vernell Leep, MD, FACP, [facility]. [facility]  To contact the attending provider between 7A-7P or the covering provider during after  hours 7P-7A, please log into the web site [email or web address] and access using universal [place] password for that web site. If you do not have the password, please call the hospital operator.  06/06/2019, 2:01 PM

## 2019-06-07 DIAGNOSIS — A419 Sepsis, unspecified organism: Principal | ICD-10-CM

## 2019-06-07 DIAGNOSIS — R6520 Severe sepsis without septic shock: Secondary | ICD-10-CM

## 2019-06-07 MED ORDER — PREDNISONE 10 MG PO TABS: 30.0000 mg | ORAL_TABLET | Freq: Every day | ORAL | 0 refills | Status: AC

## 2019-06-07 NOTE — Progress Notes (Signed)
Patient belongings returned at time of discharge from security. Daughter and patient reviewed belongings and signed yellow secure belonging sheet from security. Placed in chart.

## 2019-06-07 NOTE — TOC Initial Note (Signed)
Transition of Care [facility]) - Initial/Assessment Note    Patient Details  Name: [name] MRN: [id number] Date of Birth: 22-Feb-1946  Transition of Care [facility]) CM/SW Contact:    Tobey Schmelzle, Marjie Skiff, RN Phone Number: 06/07/2019, 1:14 PM  Clinical Narrative:                   Expected Discharge Plan: [place] Barriers to Discharge: No Barriers Identified   Patient Goals and CMS Choice Patient states their goals for this hospitalization and ongoing recovery are:: to get home CMS Medicare.gov Compare Post Acute Care list provided to:: Patient Choice offered to / list presented to : Patient  Expected Discharge Plan and Services Expected Discharge Plan: [place]   Discharge Planning Services: CM Consult Post Acute Care Choice: [place] arrangements for the past 2 months: [place] Expected Discharge Date: 06/07/19                         HH Arranged: PT, OT, Nurse's Aide [place] Agency: [facility] (now [facility]) Date [place]: 06/07/19 Time [place]: [place] Representative spoke with at [place]: [place] Arrangements/Services Living arrangements for the past 2 months: [place] Lives with:: Adult Children Patient language and need for interpreter reviewed:: Yes Do you feel safe going back to the place where you live?: Yes      Need for Family Participation in Patient Care: Yes (Comment) Care giver support system in place?: Yes (comment)   Criminal Activity/Legal Involvement Pertinent to Current Situation/Hospitalization: No - Comment as needed  Activities of Daily Living Home Assistive Devices/Equipment: Gilford Rile (specify type) ADL Screening (condition at time of admission) Patient's cognitive ability adequate to safely complete daily activities?: Yes Is the patient deaf or have difficulty hearing?: No Does the patient have difficulty seeing, even when wearing  glasses/contacts?: No Does the patient have difficulty concentrating, remembering, or making decisions?: No Patient able to express need for assistance with ADLs?: Yes Does the patient have difficulty dressing or bathing?: Yes Independently performs ADLs?: No Does the patient have difficulty walking or climbing stairs?: Yes Weakness of Legs: Both Weakness of Arms/Hands: None  Permission Sought/Granted Permission sought to share information with : Facility Art therapist granted to share information with : Yes, Verbal Permission Granted     Permission granted to share info w AGENCY: [facility]        Emotional Assessment Appearance:: Appears stated age Attitude/Demeanor/Rapport: Engaged Affect (typically observed): Accepting Orientation: : Oriented to Self, Oriented to Place, Oriented to  Time, Oriented to Situation Alcohol / Substance Use: Not Applicable    Admission diagnosis:  Peripheral edema [R60.9] AKI (acute kidney injury) ([place]) [N17.9] Severe sepsis ([place]) [A41.9, R65.20] Patient Active Problem List   Diagnosis Date Noted  . Acute respiratory failure with hypoxia ([place])   . Shock ([place])   . Central venous catheter in place   . AKI (acute kidney injury) ([place])   . Prolonged QT interval   . Pulmonary hypertension ([place])   . Hyponatremia   . Hypoglycemia   . Encephalopathy acute   . Severe sepsis ([place]) 05/31/2019  . Low bone mass 05/24/2018  . IBS (irritable bowel syndrome) 03/01/2018  . Diarrhea 12/22/2017  . Falls 12/26/2015  . Mixed incontinence urge and stress 12/26/2015  . Hypokalemia 10/03/2015  . Encounter for central line placement 09/12/2014  .  Dyslipidemia 12/31/2012  . Allergic rhinitis 12/31/2012  . Osteoarthritis, hip, bilateral 11/01/2010  . Unintentional weight loss 03/19/2009  . 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 Sarcoidosis 02/05/2009  . GERD 01/18/2009  . Essential hypertension, benign 06/08/2007   PCP:  Isaac Bliss, Rayford Halsted, MD Pharmacy:   [facility], [place] - [address] AT [address] [place] [address] Phone: [phone] Fax: [phone]  [facility] - [place], [place] [address] Phone: [phone] Fax: [phone]  [id number] - [place], [place] [address] Phone: [phone] Fax: [phone]     Social Determinants of Health (SDOH) Interventions    Readmission Risk Interventions No flowsheet data found.

## 2019-06-07 NOTE — Telephone Encounter (Signed)
Message routed to front desk pool per conversation with Hyman Hopes, front desk team lead.

## 2019-06-07 NOTE — Discharge Summary (Signed)
Physician Discharge Summary  [name] [id number] DOB: Dec 27, 1945 DOA: 05/31/2019  PCP: Isaac Bliss, Rayford Halsted, MD  Admit date: 05/31/2019 Discharge date: 06/07/2019  Admitted From: Home Disposition: Home with home health  Recommendations for Outpatient Follow-up:  1. Follow up with PCP in 1-2 weeks 2. Please obtain BMP/CBC in one week your next doctors visit.  3. Stable prednisone taper prescribed 4. Follow-up patient pulmonary in 1 month  Home Health: PT/OT/speech Equipment/Devices: Rolling walker Discharge Condition: Stable CODE STATUS: Full code Diet recommendation: 2 g salt  Brief/Interim Summary: 73 year old female lives with daughter and granddaughter ambulates with the help of cane has history of sarcoidosis on chronic prednisone, RLS, pulmonary hypertension, COPD, blind in the left eye, essential hypertension presented on 10/20 for generalized weakness, bilateral lower extremity swelling, diarrhea and change in mental status.  Admitted to ICU initially for shock thought to be multifactorial from right-sided heart failure versus sepsis versus hypovolemia.  Initially required pressors and was treated with 1 week of cefepime empirically.  Echocardiogram 10/21 showed ejection fraction 55 to 60%, severe TR.  Placed on steroids for concerns of sarcoidosis/COPD.  Over the course of several days patient started feeling better in the hospital and was saturating greater than 92% on room air.  She was seen by speech and swallow due to concerns of aspiration who recommended dysphagia 3 diet with thin liquids.  Attempted to call family on the day of discharge but no response from left voicemail.   Discharge Diagnoses:  Active Problems:   Encounter for central line placement   Severe sepsis ([place])   Shock ([place])   Central venous catheter in place   AKI (acute kidney injury) ([place])   Prolonged QT interval   Pulmonary hypertension (HCC)   Hyponatremia   Hypoglycemia    Encephalopathy acute   Acute respiratory failure with hypoxia (HCC)  Shock, multifactorial -Resolved now off pressors  Concerns for sepsis, unknown exact etiology -Cultures negative.  Initially procalcitonin elevated.  Urine cultures, blood cultures negative.  RSV panel-negative.  COVID-19-negative, GI panel-negative.  Initial chest x-ray showed opacity with rapid clearance.  Leukocytosis-normal -Empirically she was treated with 1 week of cefepime  Acute mild exacerbation of COPD Acute hypoxic respiratory distress, not on home prior to arrival -Continue oral prednisone 30 mg for 3 days followed by 20 mg daily which is her home regimen..  Completed IV cefepime -Bronchodilators Xopenex/Atrovent -I-S/flutter  Dysphagia -Seen by speech and swallow-recommend dysphagia 3 diet/thin liquids  History of sarcoidosis -On prednisone taper described above  Generalized deconditioning -Seen by PT/OT-home PT, rolling walker  Sinus tachycardia -Unknown etiology.  Likely combination of dyspnea, bronchodilators and deconditioning  Essential hypertension -Norvasc, Toprol-XL  Normocytic anemia, unspecified -Stable  Thrombocytopenia -Stable.  No evidence of bleeding  Consultations:  Pulmonary  Subjective: Feels much better saturating greater than 95% on room air.  She wishes to go home.  Discharge Exam: Vitals:   06/07/19 1300 06/07/19 1400  BP:  (!) 157/90  Pulse: (!) 110 (!) 104  Resp: (!) 29 (!) 23  Temp:    SpO2: 96% 94%   Vitals:   06/07/19 1100 06/07/19 1200 06/07/19 1300 06/07/19 1400  BP:  (!) 146/74  (!) 157/90  Pulse: (!) 118 (!) 107 (!) 110 (!) 104  Resp: 20 (!) 21 (!) 29 (!) 23  Temp:  98.3 F (36.8 C)    TempSrc:  Oral    SpO2: 97% (!) 88% 96% 94%  Weight:      Height:  General: Pt is alert, awake, not in acute distress, elderly frail cachectic Cardiovascular: RRR, S1/S2 +, no rubs, no gallops Respiratory: CTA bilaterally, no wheezing, no  rhonchi Abdominal: Soft, NT, ND, bowel sounds + Extremities: no edema, no cyanosis  Discharge Instructions  Discharge Instructions    Face-to-face encounter (required for Medicare/Medicaid patients)   Complete by: As directed    I Bonnie Roig Chirag Azuri Bozard certify that this patient is under my care and that I, or a nurse practitioner or physician's assistant working with me, had a face-to-face encounter that meets the physician face-to-face encounter requirements with this patient on 06/07/2019. The encounter with the patient was in whole, or in part for the following medical condition(s) which is the primary reason for home health care dysphagia and generalized weakness.   The encounter with the patient was in whole, or in part, for the following medical condition, which is the primary reason for home health care: weakness and dysphagia   I certify that, based on my findings, the following services are medically necessary home health services:  Nursing Physical therapy Speech language pathology     Reason for Medically Necessary Home Health Services: Skilled Nursing- Teaching of Disease Process/Symptom Management   My clinical findings support the need for the above services: Unsafe ambulation due to balance issues   Further, I certify that my clinical findings support that this patient is homebound due to: Ambulates short distances less than 300 feet   Home Health   Complete by: As directed    To provide the following care/treatments:  [facility]       Allergies as of 06/07/2019      Reactions   Ace Inhibitors Swelling   Lisinopril Swelling   REACTION: Angioedema   Valsartan Swelling   REACTION: angioedema.  Pt should not get ACEI or ARBs of any kind due to angioedema.   Aspirin Other (See Comments)   REACTION: GI ulcer.   Penicillins    Childhood allergy Has patient had a PCN reaction causing immediate rash, facial/tongue/throat swelling, SOB or  lightheadedness with hypotension: Unknown Has patient had a PCN reaction causing severe rash involving mucus membranes or skin necrosis: Unknown Has patient had a PCN reaction that required hospitalization: Unknown Has patient had a PCN reaction occurring within the last 10 years: Unknown If all of the above answers are "NO", then may proceed with Cephalosporin use.      Medication List    TAKE these medications   albuterol 108 (90 Base) MCG/ACT inhaler Commonly known as: VENTOLIN HFA Inhale 1-2 puffs into the lungs every 6 (six) hours as needed for wheezing or shortness of breath.   albuterol (2.5 MG/3ML) 0.083% nebulizer solution Commonly known as: PROVENTIL Take 3 mLs (2.5 mg total) by nebulization every 6 (six) hours as needed. As needed for SOB   amLODipine 5 MG tablet Commonly known as: NORVASC Take 1 tablet (5 mg total) by mouth daily.   doxycycline 100 MG capsule Commonly known as: VIBRAMYCIN Take 100 mg by mouth 2 (two) times daily.   etodolac 400 MG 24 hr tablet Commonly known as: LODINE XL Take 400 mg by mouth daily.   gabapentin 100 MG capsule Commonly known as: NEURONTIN Take 100 mg by mouth at bedtime.   hydrochlorothiazide 25 MG tablet Commonly known as: HYDRODIURIL Take 25 mg by mouth daily.   HYDROcodone-acetaminophen 5-325 MG tablet Commonly known as: NORCO/VICODIN Take 1 tablet by mouth every 6 (six) hours as  needed for moderate pain.   ipratropium-albuterol 0.5-2.5 (3) MG/3ML Soln Commonly known as: DUONEB USE 3 ML VIA NEBULIZER EVERY 4 HOURS AS NEEDED What changed: See the new instructions.   Magnesium 500 MG Tabs Take 500 mg by mouth at bedtime.   metoprolol succinate 50 MG 24 hr tablet Commonly known as: TOPROL-XL Take with or immediately following a meal.   pantoprazole 40 MG tablet Commonly known as: PROTONIX Take 1 tablet (40 mg total) by mouth daily.   potassium citrate 10 MEQ (1080 MG) SR tablet Commonly known as: UROCIT-K Take  10 mEq by mouth daily.   pravastatin 40 MG tablet Commonly known as: PRAVACHOL TAKE 1 TABLET BY MOUTH EVERY DAY IN THE EVENING   predniSONE 20 MG tablet Commonly known as: DELTASONE Take 20 mg by mouth 2 (two) times daily with a meal. What changed: Another medication with the same name was added. Make sure you understand how and when to take each.   predniSONE 10 MG tablet Commonly known as: DELTASONE Take 3 tablets (30 mg total) by mouth daily with breakfast for 3 days. Start taking on: June 08, 2019 What changed: You were already taking a medication with the same name, and this prescription was added. Make sure you understand how and when to take each.      Follow-up Information    Isaac Bliss, Rayford Halsted, MD. Schedule an appointment as soon as possible for a visit in 1 week(s).   Specialty: Internal Medicine Contact information: [place] [phone]        Laurin Coder, MD. Schedule an appointment as soon as possible for a visit in 1 month(s).   Specialty: Pulmonary Disease Contact information: [address] [place] [address] [phone]        [facility] Follow up.   Specialty: Home Health Services Why: For home physical therapy, occupational therapy and aide. Medicare rating: Quality of patient care 3/5 stars Patient survey summary 4/5 stars Contact information: [address] [place] [address] [phone]          Allergies  Allergen Reactions  . Ace Inhibitors Swelling  . Lisinopril Swelling    REACTION: Angioedema  . Valsartan Swelling    REACTION: angioedema.  Pt should not get ACEI or ARBs of any kind due to angioedema.  . Aspirin Other (See Comments)    REACTION: GI ulcer.  . Penicillins     Childhood allergy Has patient had a PCN reaction causing immediate rash, facial/tongue/throat swelling, SOB or lightheadedness with hypotension: Unknown Has patient had a  PCN reaction causing severe rash involving mucus membranes or skin necrosis: Unknown Has patient had a PCN reaction that required hospitalization: Unknown Has patient had a PCN reaction occurring within the last 10 years: Unknown If all of the above answers are "NO", then may proceed with Cephalosporin use.     You were cared for by a hospitalist during your hospital stay. If you have any questions about your discharge medications or the care you received while you were in the hospital after you are discharged, you can call the unit and asked to speak with the hospitalist on call if the hospitalist that took care of you is not available. Once you are discharged, your primary care physician will handle any further medical issues. Please note that no refills for any discharge medications will be authorized once you are discharged, as it is imperative that  you return to your primary care physician (or establish a relationship with a primary care physician if you do not have one) for your aftercare needs so that they can reassess your need for medications and monitor your lab values.   Procedures/Studies: Dg Chest Port 1 View  Result Date: 06/03/2019 CLINICAL DATA:  Respiratory failure EXAM: PORTABLE CHEST 1 VIEW COMPARISON:  06/01/2019 FINDINGS: Cardiac shadow is stable. Right jugular central line is again seen and stable. Bilateral upper lobe scarring is again seen with volume loss and hilar retraction. Multiple calcified lymph nodes are noted centrally. Previously seen right perihilar density has improved in the interval from the prior exam. Tiny pleural effusions are again seen. IMPRESSION: Improved aeration in the right perihilar region when compare with the prior study. Overall chronic fibrotic changes with calcified adenopathy stable from the previous exam. Electronically Signed   By: Inez Catalina M.D.   On: 06/03/2019 07:10   Dg Chest Port 1 View  Result Date: 06/01/2019 CLINICAL DATA:   Central line placement. EXAM: PORTABLE CHEST 1 VIEW COMPARISON:  Radiograph earlier this day. Chest CT 07/02/2018 FINDINGS: Right internal jugular central venous catheter tip in the mid SVC. No pneumothorax. Low lung volumes. Multifocal lung scarring with bilateral hilar retraction and calcified mediastinal adenopathy, unchanged. Right perihilar opacity is unchanged from exam earlier today. No new airspace disease. No pleural fluid. IMPRESSION: 1. Right internal jugular central venous catheter tip in the mid SVC. No pneumothorax. 2. Otherwise unchanged appearance of the chest with extensive chronic lung disease and calcified mediastinal adenopathy. 3. Unchanged right perihilar opacity from earlier today. Electronically Signed   By: Keith Rake M.D.   On: 06/01/2019 00:31   Dg Chest Port 1 View  Result Date: 05/31/2019 CLINICAL DATA:  Cough, former smoker, COPD with emphysema and sarcoidosis. EXAM: PORTABLE CHEST 1 VIEW COMPARISON:  Radiograph 10/20/2018, CT 07/02/2018 FINDINGS: There is diffuse coarse interstitial opacities throughout the lungs with scarring and fibrotic changes similar to comparison exam and extensive calcified mediastinal and hilar adenopathy compatible with patient's provided history of sarcoidosis. Increasing opacity is present in the left lung base and right perihilar region. No pneumothorax. No visible effusion. Cardiomediastinal contours are otherwise unremarkable. Bones are diffusely demineralized. Subchondral cystic changes are present in both humeri, similar to comparisons. No acute osseous or soft tissue abnormality. IMPRESSION: Diffuse architectural changes and calcified mediastinal and hilar adenopathy compatible with history of sarcoidosis. Increasing opacity in the right perihilar region and left lung base could reflect interval worsening of patient's underlying disease versus superimposed process either infection or edema. Electronically Signed   By: Lovena Le M.D.   On:  05/31/2019 18:36      The results of significant diagnostics from this hospitalization (including imaging, microbiology, ancillary and laboratory) are listed below for reference.     Microbiology: Recent Results (from the past 240 hour(s))  Culture, blood (Routine X 2) w Reflex to ID Panel     Status: None   Collection Time: 05/31/19  6:20 PM   Specimen: BLOOD LEFT FOREARM  Result Value Ref Range Status   Specimen Description   Final    BLOOD LEFT FOREARM Performed at [facility] [address] [place], [place] [address]    Special Requests   Final    BOTTLES DRAWN AEROBIC AND ANAEROBIC Blood Culture adequate volume Performed at [facility] [address] [place],  [address]    Culture   Final    NO GROWTH  5 DAYS Performed at [facility], [place] [address], [place], [place] [address]    Report Status 06/05/2019 FINAL  Final  SARS Coronavirus 2 by RT PCR (hospital order, performed in [facility] hospital lab) Nasopharyngeal Nasopharyngeal Swab     Status: None   Collection Time: 05/31/19  6:35 PM   Specimen: Nasopharyngeal Swab  Result Value Ref Range Status   SARS Coronavirus 2 NEGATIVE NEGATIVE Final    Comment: (NOTE) If result is NEGATIVE SARS-CoV-2 target nucleic acids are NOT DETECTED. The SARS-CoV-2 RNA is generally detectable in upper and lower  respiratory specimens during the acute phase of infection. The lowest  concentration of SARS-CoV-2 viral copies this assay can detect is 250  copies / mL. A negative result does not preclude SARS-CoV-2 infection  and should not be used as the sole basis for treatment or other  patient management decisions.  A negative result may occur with  improper specimen collection / handling, submission of specimen other  than nasopharyngeal swab, presence of viral mutation(s) within the  areas targeted by this assay, and inadequate number of viral copies  (<250 copies / mL). A  negative result must be combined with clinical  observations, patient history, and epidemiological information. If result is POSITIVE SARS-CoV-2 target nucleic acids are DETECTED. The SARS-CoV-2 RNA is generally detectable in upper and lower  respiratory specimens dur ing the acute phase of infection.  Positive  results are indicative of active infection with SARS-CoV-2.  Clinical  correlation with patient history and other diagnostic information is  necessary to determine patient infection status.  Positive results do  not rule out bacterial infection or co-infection with other viruses. If result is PRESUMPTIVE POSTIVE SARS-CoV-2 nucleic acids MAY BE PRESENT.   A presumptive positive result was obtained on the submitted specimen  and confirmed on repeat testing.  While 2019 novel coronavirus  (SARS-CoV-2) nucleic acids may be present in the submitted sample  additional confirmatory testing may be necessary for epidemiological  and / or clinical management purposes  to differentiate between  SARS-CoV-2 and other Sarbecovirus currently known to infect humans.  If clinically indicated additional testing with an alternate test  methodology [phone]) is advised. The SARS-CoV-2 RNA is generally  detectable in upper and lower respiratory sp ecimens during the acute  phase of infection. The expected result is Negative. Fact Sheet for Patients:  StrictlyIdeas.no Fact Sheet for Healthcare Providers: BankingDealers.co.za This test is not yet approved or cleared by the [place] FDA and has been authorized for detection and/or diagnosis of SARS-CoV-2 by FDA under an Emergency Use Authorization (EUA).  This EUA will remain in effect (meaning this test can be used) for the duration of the COVID-19 declaration under Section 564(b)(1) of the Act, 21 U.S.C. section 360bbb-3(b)(1), unless the authorization is terminated or revoked sooner. Performed  at [facility], [place] [address] [place], [place] [address]   MRSA PCR Screening     Status: None   Collection Time: 05/31/19 11:46 PM   Specimen: Nasopharyngeal  Result Value Ref Range Status   MRSA by PCR NEGATIVE NEGATIVE Final    Comment:        The GeneXpert MRSA Assay (FDA approved for NASAL specimens only), is one component of a comprehensive MRSA colonization surveillance program. It is not intended to diagnose MRSA infection nor to guide or monitor treatment for MRSA infections. Performed at [facility], [place] [address] [place], [place] [address]   Urine  culture     Status: Abnormal   Collection Time: 06/01/19  1:16 AM   Specimen: Urine, Random  Result Value Ref Range Status   Specimen Description   Final    URINE, RANDOM Performed at [facility] [address], [place], [place] [address]    Special Requests   Final    NONE Performed at [facility], [place] [address] [place], [place] [address]    Culture MULTIPLE SPECIES PRESENT, SUGGEST RECOLLECTION (A)  Final   Report Status 06/01/2019 FINAL  Final  Culture, blood (Routine X 2) w Reflex to ID Panel     Status: None   Collection Time: 06/01/19  2:22 AM   Specimen: BLOOD  Result Value Ref Range Status   Specimen Description   Final    BLOOD LEFT ARM Performed at [facility] [address], [place], [place] [address]    Special Requests   Final    BOTTLES DRAWN AEROBIC ONLY Blood Culture adequate volume Performed at [facility] [address], [place], [place] [address]    Culture   Final    NO GROWTH 5 DAYS Performed at [facility], [place] [address], [place], [place] [address]    Report Status 06/06/2019 FINAL  Final  Respiratory Panel by PCR     Status: None   Collection Time: 06/01/19  2:45 AM   Specimen: Respiratory  Result Value Ref Range Status   Adenovirus NOT DETECTED NOT  DETECTED Final   Coronavirus 229E NOT DETECTED NOT DETECTED Final    Comment: (NOTE) The Coronavirus on the Respiratory Panel, DOES NOT test for the novel  Coronavirus (2019 nCoV)    Coronavirus HKU1 NOT DETECTED NOT DETECTED Final   Coronavirus NL63 NOT DETECTED NOT DETECTED Final   Coronavirus OC43 NOT DETECTED NOT DETECTED Final   Metapneumovirus NOT DETECTED NOT DETECTED Final   Rhinovirus / Enterovirus NOT DETECTED NOT DETECTED Final   Influenza A NOT DETECTED NOT DETECTED Final   Influenza B NOT DETECTED NOT DETECTED Final   Parainfluenza Virus 1 NOT DETECTED NOT DETECTED Final   Parainfluenza Virus 2 NOT DETECTED NOT DETECTED Final   Parainfluenza Virus 3 NOT DETECTED NOT DETECTED Final   Parainfluenza Virus 4 NOT DETECTED NOT DETECTED Final   Respiratory Syncytial Virus NOT DETECTED NOT DETECTED Final   Bordetella pertussis NOT DETECTED NOT DETECTED Final   Chlamydophila pneumoniae NOT DETECTED NOT DETECTED Final   Mycoplasma pneumoniae NOT DETECTED NOT DETECTED Final    Comment: Performed at [facility], [place]. [address] [place], [place] [address]  GI pathogen panel by PCR, stool     Status: None   Collection Time: 06/01/19  2:45 AM   Specimen: Stool  Result Value Ref Range Status   Plesiomonas shigelloides NOT DETECTED NOT DETECTED Final   Yersinia enterocolitica NOT DETECTED NOT DETECTED Final   Vibrio NOT DETECTED NOT DETECTED Final   Enteropathogenic E coli NOT DETECTED NOT DETECTED Final   E coli (ETEC) LT/ST NOT DETECTED NOT DETECTED Final   E coli [id number] by PCR Not applicable NOT DETECTED Final   Cryptosporidium by PCR NOT DETECTED NOT DETECTED Final   Entamoeba histolytica NOT DETECTED NOT DETECTED Final   Adenovirus F 40/41 NOT DETECTED NOT DETECTED Final   Norovirus GI/GII NOT DETECTED NOT DETECTED Final   Sapovirus NOT DETECTED NOT DETECTED Final    Comment: (NOTE) Performed At: [facility] [address]  [place], [place] [id number] Rush Farmer MD [id number]    Vibrio cholerae NOT DETECTED NOT DETECTED Final   Campylobacter by PCR NOT DETECTED NOT DETECTED Final   Salmonella by PCR NOT DETECTED NOT DETECTED Final   E coli (STEC) NOT DETECTED NOT DETECTED Final   Enteroaggregative E coli NOT DETECTED NOT DETECTED Final   Shigella by PCR NOT DETECTED NOT DETECTED Final   Cyclospora cayetanensis NOT DETECTED NOT DETECTED Final   Astrovirus NOT DETECTED NOT DETECTED Final   G lamblia by PCR NOT DETECTED NOT DETECTED Final   Rotavirus A by PCR NOT DETECTED NOT DETECTED Final  Culture, Urine     Status: None   Collection Time: 06/02/19  6:30 PM   Specimen: Urine, Random  Result Value Ref Range Status   Specimen Description   Final    URINE, RANDOM Performed at [facility], [place] [address], [place], [place] [address]    Special Requests   Final    NONE Performed at [facility], [place] [address], [place], [place] [address]    Culture   Final    NO GROWTH Performed at [facility], [place] [address], [place],  [address]    Report Status 06/03/2019 FINAL  Final     Labs: BNP (last 3 results) Recent Labs    05/31/19 1821  BNP [id number]*   Basic Metabolic Panel: Recent Labs  Lab 06/01/19 0523  06/02/19 0500 06/03/19 0410 06/04/19 0245 06/05/19 0500 06/06/19 1241  NA 125*   < > 132* 135 137 136 133*  K 4.0   < > 5.0 4.2 3.6 3.0* 4.0  CL 90*   < > 97* 101 101 96* 95*  CO2 25   < > 26 25 27 31 28   GLUCOSE 90   < > 139* 102* 107* 83 108*  BUN 31*   < > 25* 18 15 12 12   CREATININE 1.82*   < > 1.20* 0.88 0.68 0.39* 0.65  CALCIUM 7.1*   < > 7.4* 7.7* 8.1* 8.4* 8.2*  MG 3.5*  --  3.0*  --   --  1.5* 1.8  PHOS 3.9  --   --   --   --   --   --    < > = values in this interval not displayed.   Liver Function Tests: Recent Labs  Lab 05/31/19 1821 06/01/19 0523 06/05/19 0500  AST 38 48* 75*  ALT 19 23 46*  ALKPHOS 134* 137* 282*  BILITOT 1.1 1.5* 1.4*  PROT  4.8* 5.2* 5.4*  ALBUMIN 2.0* 2.1* 2.4*   Recent Labs  Lab 05/31/19 1821  LIPASE 13   No results for input(s): AMMONIA in the last 168 hours. CBC: Recent Labs  Lab 05/31/19 1821 06/01/19 0523 06/02/19 0500 06/03/19 0410 06/04/19 0245 06/05/19 0500  WBC 9.2 16.1* 13.7* 9.1 7.6 9.6  NEUTROABS 7.8*  --   --   --   --   --   HGB 10.0* 10.4* 10.7* 9.1* 9.7* 10.3*  HCT 30.0* 29.7* 30.3* 25.8* 28.0* 30.4*  MCV 86.5 82.7 81.9 82.7 83.3 84.0  PLT 146* 176 189 128* 142* 193   Cardiac Enzymes: No results for input(s): CKTOTAL, CKMB, CKMBINDEX, TROPONINI in the last 168 hours. BNP: Invalid input(s): POCBNP CBG: No results for input(s): GLUCAP in the last 168 hours. D-Dimer No results for input(s): DDIMER in the last 72 hours. Hgb A1c No results for input(s): HGBA1C in the last  72 hours. Lipid Profile No results for input(s): CHOL, HDL, LDLCALC, TRIG, CHOLHDL, LDLDIRECT in the last 72 hours. Thyroid function studies No results for input(s): TSH, T4TOTAL, T3FREE, THYROIDAB in the last 72 hours.  Invalid input(s): FREET3 Anemia work up No results for input(s): VITAMINB12, FOLATE, FERRITIN, TIBC, IRON, RETICCTPCT in the last 72 hours. Urinalysis    Component Value Date/Time   COLORURINE YELLOW 06/01/2019 0116   APPEARANCEUR CLOUDY (A) 06/01/2019 0116   LABSPEC 1.017 06/01/2019 0116   PHURINE 5.0 06/01/2019 0116   GLUCOSEU NEGATIVE 06/01/2019 0116   GLUCOSEU NEG mg/dL 01/18/2009 1723   HGBUR NEGATIVE 06/01/2019 0116   BILIRUBINUR MODERATE (A) 06/01/2019 0116   KETONESUR NEGATIVE 06/01/2019 0116   PROTEINUR NEGATIVE 06/01/2019 0116   UROBILINOGEN 0.2 12/16/2010 1802   NITRITE NEGATIVE 06/01/2019 0116   LEUKOCYTESUR TRACE (A) 06/01/2019 0116   Sepsis Labs Invalid input(s): PROCALCITONIN,  WBC,  LACTICIDVEN Microbiology Recent Results (from the past 240 hour(s))  Culture, blood (Routine X 2) w Reflex to ID Panel     Status: None   Collection Time: 05/31/19  6:20 PM    Specimen: BLOOD LEFT FOREARM  Result Value Ref Range Status   Specimen Description   Final    BLOOD LEFT FOREARM Performed at [facility], [place] [address], [place], [place] [address]    Special Requests   Final    BOTTLES DRAWN AEROBIC AND ANAEROBIC Blood Culture adequate volume Performed at [place] [address] [place], [place] [address]    Culture   Final    NO GROWTH 5 DAYS Performed at [facility], [place] [address], [place], [place] [address]    Report Status 06/05/2019 FINAL  Final  SARS Coronavirus 2 by RT PCR (hospital order, performed in [facility] hospital lab) Nasopharyngeal Nasopharyngeal Swab     Status: None   Collection Time: 05/31/19  6:35 PM   Specimen: Nasopharyngeal Swab  Result Value Ref Range Status   SARS Coronavirus 2 NEGATIVE NEGATIVE Final    Comment: (NOTE) If result is NEGATIVE SARS-CoV-2 target nucleic acids are NOT DETECTED. The SARS-CoV-2 RNA is generally detectable in upper and lower  respiratory specimens during the acute phase of infection. The lowest  concentration of SARS-CoV-2 viral copies this assay can detect is 250  copies / mL. A negative result does not preclude SARS-CoV-2 infection  and should not be used as the sole basis for treatment or other  patient management decisions.  A negative result may occur with  improper specimen collection / handling, submission of specimen other  than nasopharyngeal swab, presence of viral mutation(s) within the  areas targeted by this assay, and inadequate number of viral copies  (<250 copies / mL). A negative result must be combined with clinical  observations, patient history, and epidemiological information. If result is POSITIVE SARS-CoV-2 target nucleic acids are DETECTED. The SARS-CoV-2 RNA is generally detectable in upper and lower  respiratory specimens dur ing the acute phase of infection.  Positive  results are indicative of active infection  with SARS-CoV-2.  Clinical  correlation with patient history and other diagnostic information is  necessary to determine patient infection status.  Positive results do  not rule out bacterial infection or co-infection with other viruses. If result is PRESUMPTIVE POSTIVE SARS-CoV-2 nucleic acids MAY BE PRESENT.   A presumptive positive result was obtained on the submitted specimen  and confirmed on repeat testing.  While 2019 novel coronavirus  (SARS-CoV-2) nucleic  acids may be present in the submitted sample  additional confirmatory testing may be necessary for epidemiological  and / or clinical management purposes  to differentiate between  SARS-CoV-2 and other Sarbecovirus currently known to infect humans.  If clinically indicated additional testing with an alternate test  methodology [phone]) is advised. The SARS-CoV-2 RNA is generally  detectable in upper and lower respiratory sp ecimens during the acute  phase of infection. The expected result is Negative. Fact Sheet for Patients:  StrictlyIdeas.no Fact Sheet for Healthcare Providers: BankingDealers.co.za This test is not yet approved or cleared by the [place] FDA and has been authorized for detection and/or diagnosis of SARS-CoV-2 by FDA under an Emergency Use Authorization (EUA).  This EUA will remain in effect (meaning this test can be used) for the duration of the COVID-19 declaration under Section 564(b)(1) of the Act, 21 U.S.C. section 360bbb-3(b)(1), unless the authorization is terminated or revoked sooner. Performed at [facility], [place] [address], [place], [place] [address]   MRSA PCR Screening     Status: None   Collection Time: 05/31/19 11:46 PM   Specimen: Nasopharyngeal  Result Value Ref Range Status   MRSA by PCR NEGATIVE NEGATIVE Final    Comment:        The GeneXpert MRSA Assay (FDA approved for NASAL specimens only), is one component  of a comprehensive MRSA colonization surveillance program. It is not intended to diagnose MRSA infection nor to guide or monitor treatment for MRSA infections. Performed at [facility], [place] [address], [place], [place] [address]   Urine culture     Status: Abnormal   Collection Time: 06/01/19  1:16 AM   Specimen: Urine, Random  Result Value Ref Range Status   Specimen Description   Final    URINE, RANDOM Performed at [place] [address] [place], [place] [address]    Special Requests   Final    NONE Performed at [facility], [place] [address] [place], [place] [address]    Culture MULTIPLE SPECIES PRESENT, SUGGEST RECOLLECTION (A)  Final   Report Status 06/01/2019 FINAL  Final  Culture, blood (Routine X 2) w Reflex to ID Panel     Status: None   Collection Time: 06/01/19  2:22 AM   Specimen: BLOOD  Result Value Ref Range Status   Specimen Description   Final    BLOOD LEFT ARM Performed at [place] [address], [place], [place] [address]    Special Requests   Final    BOTTLES DRAWN AEROBIC ONLY Blood Culture adequate volume Performed at [place] [address] [place],  [address]    Culture   Final    NO GROWTH 5 DAYS Performed at [facility], [place] [address], [place],  [address]    Report Status 06/06/2019 FINAL  Final  Respiratory Panel by PCR     Status: None   Collection Time: 06/01/19  2:45 AM   Specimen: Respiratory  Result Value Ref Range Status   Adenovirus NOT DETECTED NOT DETECTED Final   Coronavirus 229E NOT DETECTED NOT DETECTED Final    Comment: (NOTE) The Coronavirus on the Respiratory Panel, DOES NOT test for the novel  Coronavirus (2019 nCoV)    Coronavirus HKU1 NOT DETECTED NOT DETECTED Final   Coronavirus NL63 NOT DETECTED NOT DETECTED Final   Coronavirus OC43 NOT DETECTED NOT DETECTED Final   Metapneumovirus NOT  DETECTED  NOT DETECTED Final   Rhinovirus / Enterovirus NOT DETECTED NOT DETECTED Final   Influenza A NOT DETECTED NOT DETECTED Final   Influenza B NOT DETECTED NOT DETECTED Final   Parainfluenza Virus 1 NOT DETECTED NOT DETECTED Final   Parainfluenza Virus 2 NOT DETECTED NOT DETECTED Final   Parainfluenza Virus 3 NOT DETECTED NOT DETECTED Final   Parainfluenza Virus 4 NOT DETECTED NOT DETECTED Final   Respiratory Syncytial Virus NOT DETECTED NOT DETECTED Final   Bordetella pertussis NOT DETECTED NOT DETECTED Final   Chlamydophila pneumoniae NOT DETECTED NOT DETECTED Final   Mycoplasma pneumoniae NOT DETECTED NOT DETECTED Final    Comment: Performed at [facility], [place] [address], [place], [place] [address]  GI pathogen panel by PCR, stool     Status: None   Collection Time: 06/01/19  2:45 AM   Specimen: Stool  Result Value Ref Range Status   Plesiomonas shigelloides NOT DETECTED NOT DETECTED Final   Yersinia enterocolitica NOT DETECTED NOT DETECTED Final   Vibrio NOT DETECTED NOT DETECTED Final   Enteropathogenic E coli NOT DETECTED NOT DETECTED Final   E coli (ETEC) LT/ST NOT DETECTED NOT DETECTED Final   E coli [id number] by PCR Not applicable NOT DETECTED Final   Cryptosporidium by PCR NOT DETECTED NOT DETECTED Final   Entamoeba histolytica NOT DETECTED NOT DETECTED Final   Adenovirus F 40/41 NOT DETECTED NOT DETECTED Final   Norovirus GI/GII NOT DETECTED NOT DETECTED Final   Sapovirus NOT DETECTED NOT DETECTED Final    Comment: (NOTE) Performed At: [facility] [place], [place] [id number] Rush Farmer MD [id number]    Vibrio cholerae NOT DETECTED NOT DETECTED Final   Campylobacter by PCR NOT DETECTED NOT DETECTED Final   Salmonella by PCR NOT DETECTED NOT DETECTED Final   E coli (STEC) NOT DETECTED NOT DETECTED Final   Enteroaggregative E coli NOT DETECTED NOT DETECTED Final   Shigella by PCR NOT DETECTED NOT DETECTED Final   Cyclospora  cayetanensis NOT DETECTED NOT DETECTED Final   Astrovirus NOT DETECTED NOT DETECTED Final   G lamblia by PCR NOT DETECTED NOT DETECTED Final   Rotavirus A by PCR NOT DETECTED NOT DETECTED Final  Culture, Urine     Status: None   Collection Time: 06/02/19  6:30 PM   Specimen: Urine, Random  Result Value Ref Range Status   Specimen Description   Final    URINE, RANDOM Performed at [facility], [place] [address], [place], [place] [address]    Special Requests   Final    NONE Performed at [facility], [place] [address] [place], [place] [address]    Culture   Final    NO GROWTH Performed at [facility], [place] [address] [place], [place] [address]    Report Status 06/03/2019 FINAL  Final     Time coordinating discharge:  I have spent 35 minutes face to face with the patient and on the ward discussing the patients care, assessment, plan and disposition with other care givers. >50% of the time was devoted counseling the patient about the risks and benefits of treatment/Discharge disposition and coordinating care.   SIGNED:   Damita Lack, MD  [facility] 06/07/2019, 3:15 PM   If 7PM-7AM, please contact night-coverage

## 2019-06-07 NOTE — Progress Notes (Signed)
  Speech Language Pathology Treatment: Dysphagia  Patient Details Name: [name] MRN: [id number] DOB: 1945-10-11 Today's Date: 06/07/2019 Time: 1050-1105 SLP Time Calculation (min) (ACUTE ONLY): 15 min  Assessment / Plan / Recommendation Clinical Impression  Pt demonstrating significantly improved swallowing compared to initial evaluation 10/25. She is able to feed herslef without evidence of oral residuals not s/s of aspiration. Pt does report issues with sensing "mucus" in her proximal pharynx - pointing to vallecular region. SLP advised she maintain strength of cough/"hock" to expectorate as needed.  SLP observed pt consuming pills *given by RN* one at a time with liquids with great tolerance.  As pt has h/o reflux - provided her with reflux precautions and reviewed foods that may be useful to avoid if exacerbates reflux.  Also provided her with heimlich maneuver in writing and pt demonstrated universal sign of choking.  All education completed and suspect pt's swallow is at baseline function with improved medical status. No SLP follow up needed. Thanks for this consult.    HPI HPI: 73 year old female, lives with her daughter and grandchildren, ambulates with the help of a cane, PMH of sarcoidosis on chronic prednisone, associated ILD and pulmonary hypertension, former tobacco abuse, COPD, GERD, HTN, blind in left eye, presented on 10/20 due to generalized weakness, bilateral leg edema, diarrhea and altered mental status.  Admitted to ICU by CCM due to shock-multifactorial due to right heart failure versus sepsis versus hypovolemia.  Stabilized and transferred to [facility] on 10/24.       SLP Plan  All goals met       Recommendations  Diet recommendations: Dysphagia 3 (mechanical soft);Thin liquid due to lack of lower dentition and for energy conservation Liquids provided via: Straw;Cup Medication Administration: Whole meds with liquid Supervision: Patient able to self  feed Compensations: Minimize environmental distractions;Slow rate;Small sips/bites Postural Changes and/or Swallow Maneuvers: Seated upright 90 degrees;Upright 30-60 min after meal                Oral Care Recommendations: Oral care BID Follow up Recommendations: None SLP Visit Diagnosis: Dysphagia, unspecified (R13.10) Plan: All goals met       GO                Macario Golds 06/07/2019, 11:29 AM  Luanna Salk, MS [facility] SLP Acute Rehab Services Pager [phone] Office [phone]

## 2019-06-08 ENCOUNTER — Telehealth: Payer: Self-pay | Admitting: *Deleted

## 2019-06-08 NOTE — Telephone Encounter (Signed)
Transition Care Management Follow-up Telephone Call   Date discharged? 10.27.2020   How have you been since you were released from the hospital? Per Son" she is doing pretty good and the medication makes her rest a lot"    Do you understand why you were in the hospital? yes   Do you understand the discharge instructions? yes   Where were you discharged to? Home    Items Reviewed:  Medications reviewed: yes  Allergies reviewed: yes  Dietary changes reviewed: yes  Referrals reviewed: yes   Functional Questionnaire:   Activities of Daily Living (ADLs):   She states they are independent in the following: ambulation, bathing and hygiene, feeding, continence, grooming, toileting and dressing States they require assistance with the following: N/A   Any transportation issues/concerns?: no   Any patient concerns? no   Confirmed importance and date/time of follow-up visits scheduled yes  Provider Appointment booked with  Dr. Jerilee Hoh 06/14/2019 at 1PM   Confirmed with patient if condition begins to worsen call PCP or go to the ER.  Patient was given the office number and encouraged to call back with question or concerns.  : yes '

## 2019-06-13 ENCOUNTER — Other Ambulatory Visit: Payer: Self-pay | Admitting: Internal Medicine

## 2019-06-13 DIAGNOSIS — I10 Essential (primary) hypertension: Secondary | ICD-10-CM

## 2019-06-14 ENCOUNTER — Telehealth: Payer: Self-pay | Admitting: Internal Medicine

## 2019-06-14 ENCOUNTER — Inpatient Hospital Stay: Payer: Self-pay | Admitting: Internal Medicine

## 2019-06-14 NOTE — Telephone Encounter (Signed)
Noted  

## 2019-06-14 NOTE — Telephone Encounter (Signed)
Home Health Verbal Orders -[facility]   Caller/Agency: Renata Caprice Number:  [id number] Requesting OT/PT/Skilled Nursing/Social Work/Speech Therapy:for home physical therapy  Frequency: 2x4 1x5

## 2019-06-14 NOTE — Telephone Encounter (Signed)
Copied from [place] [id number]. Topic: General - Other >> Jun 14, 2019 12:31 PM Leward Quan A wrote: Reason for CRM: Patient was scheduled for an in office visit but out voting and ran out of time. Patient was rescheduled for 06/15/2019 at 1.30 pm for a virtual visit to be reached at her daughters  Ph# [phone]

## 2019-06-15 ENCOUNTER — Telehealth (INDEPENDENT_AMBULATORY_CARE_PROVIDER_SITE_OTHER): Payer: Medicare Other | Admitting: Internal Medicine

## 2019-06-15 ENCOUNTER — Encounter: Payer: Self-pay | Admitting: Internal Medicine

## 2019-06-15 ENCOUNTER — Other Ambulatory Visit: Payer: Self-pay

## 2019-06-15 DIAGNOSIS — R579 Shock, unspecified: Secondary | ICD-10-CM | POA: Diagnosis not present

## 2019-06-15 DIAGNOSIS — R6520 Severe sepsis without septic shock: Secondary | ICD-10-CM

## 2019-06-15 DIAGNOSIS — J9601 Acute respiratory failure with hypoxia: Secondary | ICD-10-CM | POA: Diagnosis not present

## 2019-06-15 DIAGNOSIS — A419 Sepsis, unspecified organism: Secondary | ICD-10-CM | POA: Diagnosis not present

## 2019-06-15 DIAGNOSIS — Z09 Encounter for follow-up examination after completed treatment for conditions other than malignant neoplasm: Secondary | ICD-10-CM

## 2019-06-15 DIAGNOSIS — J432 Centrilobular emphysema: Secondary | ICD-10-CM | POA: Diagnosis not present

## 2019-06-15 DIAGNOSIS — D869 Sarcoidosis, unspecified: Secondary | ICD-10-CM

## 2019-06-15 NOTE — Progress Notes (Signed)
Virtual Visit via Video Note  I connected with [name] on 06/15/19 at  1:30 PM EST by a video enabled telemedicine application and verified that I am speaking with the correct person using two identifiers.  Location patient: home Location provider: work office Persons participating in the virtual visit: patient, provider, son  I discussed the limitations of evaluation and management by telemedicine and the availability of in person appointments. The patient expressed understanding and agreed to proceed.   HPI: [name] is a 73 y.o. female who is coming in today for the above mentioned reasons, specifically transitional care services face-to-face visit.    Dates hospitalized: 10/20-10/27/2020 Days since discharge from hospital: 8 Patient was discharged from the hospital to: Home Reason for admission to hospital: Septic shock Date of interactive phone contact with patient and/or caregiver: 06/08/2019  I have reviewed in detail patient's discharge summary plus pertinent specific notes, labs, and images from the hospitalization.   Patient initially went to the hospital due to shortness of breath, cough, fever decreased appetite and swollen feet.  She was found to have severe sepsis with septic shock of unknown origin.  All work-up including culture data and images were negative.  She did end up being treated with 1 week of empiric cefepime.  She was also treated as a possible mild COPD exacerbation with a prednisone taper.  She was found to be mildly deconditioned and was arranged to have home health PT/OT/RN but this has not yet started.  She has been doing very well since discharge, she states "I am doing business as usual".  She has been doing her own grooming, she has been doing her own housework and she cooked Sunday dinner for her family last week.  She no longer feels short of breath.  Medication reconciliation was done today and patient is taking meds as  recommended by discharging hospitalist/specialist.  Yes, she has no concerns about her medications.    ROS: Constitutional: Denies fever, chills, diaphoresis, appetite change and fatigue.  HEENT: Denies photophobia, eye pain, redness, hearing loss, ear pain, congestion, sore throat, rhinorrhea, sneezing, mouth sores, trouble swallowing, neck pain, neck stiffness and tinnitus.   Respiratory: Denies SOB, DOE, cough, chest tightness,  and wheezing.   Cardiovascular: Denies chest pain, palpitations and leg swelling.  Gastrointestinal: Denies nausea, vomiting, abdominal pain, diarrhea, constipation, blood in stool and abdominal distention.  Genitourinary: Denies dysuria, urgency, frequency, hematuria, flank pain and difficulty urinating.  Endocrine: Denies: hot or cold intolerance, sweats, changes in hair or nails, polyuria, polydipsia. Musculoskeletal: Denies myalgias, back pain, joint swelling, arthralgias and gait problem.  Skin: Denies pallor, rash and wound.  Neurological: Denies dizziness, seizures, syncope, weakness, light-headedness, numbness and headaches.  Hematological: Denies adenopathy. Easy bruising, personal or family bleeding history  Psychiatric/Behavioral: Denies suicidal ideation, mood changes, confusion, nervousness, sleep disturbance and agitation   Past Medical History:  Diagnosis Date  . Blind left eye   . Cataract of both eyes 01/18/2009   Right surgically repaired  . 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  . Depression 06/08/2007  . Essential hypertension, benign 06/08/2007  . GERD 01/18/2009  . Osteoporosis 12/31/2012   Left femur had T score -2.5 on DEXA scan 02/26/2009.   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 Sarcoidosis 02/05/2009   PFTs 03/30/2009: FVC 67%, FEV1 48%, FEV1/FVC 52%, TLC 104%, DLCO 71%.  Past Surgical History:  Procedure Laterality Date  . CATARACT  EXTRACTION Right   . COLONOSCOPY  2002  . HIP ARTHROPLASTY Right   . TUBAL LIGATION      Family History  Problem Relation Age of Onset  . Cancer Mother   . Heart disease Mother   . Breast cancer Mother   . Stroke Father   . Cancer Sister   . Heart disease Sister   . Asthma Daughter   . Cancer Maternal Aunt   . Cancer Other   . Diabetes Other   . Hypertension Other   . Colon cancer Neg Hx     SOCIAL HX:   reports that she quit smoking about 9 years ago. She has a 7.50 pack-year smoking history. She has never used smokeless tobacco. She reports current alcohol use. She reports that she does not use drugs.   Current Outpatient Medications:  .  albuterol (PROVENTIL) (2.5 MG/3ML) 0.083% nebulizer solution, Take 3 mLs (2.5 mg total) by nebulization every 6 (six) hours as needed. As needed for SOB, Disp: 75 mL, Rfl: 3 .  albuterol (VENTOLIN HFA) 108 (90 Base) MCG/ACT inhaler, Inhale 1-2 puffs into the lungs every 6 (six) hours as needed for wheezing or shortness of breath., Disp: 18 g, Rfl: 1 .  amLODipine (NORVASC) 5 MG tablet, Take 1 tablet (5 mg total) by mouth daily., Disp: 90 tablet, Rfl: 1 .  doxycycline (VIBRAMYCIN) 100 MG capsule, Take 100 mg by mouth 2 (two) times daily., Disp: , Rfl:  .  etodolac (LODINE XL) 400 MG 24 hr tablet, Take 400 mg by mouth daily., Disp: , Rfl:  .  gabapentin (NEURONTIN) 100 MG capsule, Take 100 mg by mouth at bedtime., Disp: , Rfl:  .  hydrochlorothiazide (HYDRODIURIL) 25 MG tablet, Take 25 mg by mouth daily., Disp: , Rfl:  .  HYDROcodone-acetaminophen (NORCO/VICODIN) 5-325 MG tablet, Take 1 tablet by mouth every 6 (six) hours as needed for moderate pain., Disp: , Rfl:  .  ipratropium-albuterol (DUONEB) 0.5-2.5 (3) MG/3ML SOLN, USE 3 ML VIA NEBULIZER EVERY 4 HOURS AS NEEDED (Patient taking differently: Take 3 mLs by nebulization every 4 (four) hours as needed (sob). ), Disp: 2970 mL, Rfl: 6 .  Magnesium 500 MG TABS, Take 500 mg by mouth at bedtime.,  Disp: , Rfl:  .  metoprolol succinate (TOPROL-XL) 50 MG 24 hr tablet, TAKE 1 TABLET BY MOUTH EVERY DAY TAKE WITH OR IMMEDIATELY FOLLOWING A MEAL., Disp: 90 tablet, Rfl: 1 .  pantoprazole (PROTONIX) 40 MG tablet, Take 1 tablet (40 mg total) by mouth daily., Disp: 90 tablet, Rfl: 1 .  potassium citrate (UROCIT-K) 10 MEQ (1080 MG) SR tablet, Take 10 mEq by mouth daily., Disp: , Rfl:  .  pravastatin (PRAVACHOL) 40 MG tablet, TAKE 1 TABLET BY MOUTH EVERY DAY IN THE EVENING, Disp: 90 tablet, Rfl: 1 .  predniSONE (DELTASONE) 20 MG tablet, Take 20 mg by mouth 2 (two) times daily with a meal., Disp: , Rfl:   EXAM:   VITALS per patient if applicable: None reported  GENERAL: alert, oriented, appears well and in no acute distress  HEENT: atraumatic, conjunttiva clear, no obvious abnormalities on inspection of external nose and ears, wears corrective lenses  NECK: normal movements of the head and neck  LUNGS: on inspection no signs of respiratory distress, breathing rate appears normal, no obvious gross increased work of breathing, gasping or wheezing  CV: no obvious cyanosis  MS: moves all visible extremities without noticeable  abnormality  PSYCH/NEURO: pleasant and cooperative, no obvious depression or anxiety, speech and thought processing grossly intact  ASSESSMENT AND PLAN:   Hospital discharge follow-up Acute respiratory failure with hypoxia (HCC) Shock ([place]) Severe sepsis ([place]) Centrilobular emphysema ([place]) Sarcoidosis  -She has been doing very well since discharge. -She has no longer short of breath, states her feet swelling has decreased substantially.  States she received a call from home health but her services have not yet started. -Son on call also admits that she has been doing quite well.  Will have her follow-up in office in 6 weeks to obtain blood work that was recommended by discharging hospitalist.   Moderate complexity was used today.   I discussed the assessment  and treatment plan with the patient. The patient was provided an opportunity to ask questions and all were answered. The patient agreed with the plan and demonstrated an understanding of the instructions.   The patient was advised to call back or seek an in-person evaluation if the symptoms worsen or if the condition fails to improve as anticipated.    Lelon Frohlich, MD  [facility]

## 2019-06-16 NOTE — Telephone Encounter (Signed)
Called and left message on pt vm to call back to schedule f/u with Dr. Ander Slade -pr

## 2019-06-20 NOTE — Telephone Encounter (Signed)
L/m on pt vm to call back to schedule f/u with Dr. Ander Slade -pr

## 2019-06-22 NOTE — Telephone Encounter (Signed)
Patient scheduled with Dr. Ander Slade on 07/12/2019-pr

## 2019-06-23 ENCOUNTER — Telehealth: Payer: Self-pay | Admitting: *Deleted

## 2019-06-23 NOTE — Telephone Encounter (Signed)
Ok to place order 

## 2019-06-23 NOTE — Telephone Encounter (Signed)
Left message on machine for [name] with verbal orders.  Asked him to call back if anything further is needed.

## 2019-06-23 NOTE — Telephone Encounter (Signed)
Copied from [place] [phone]. Topic: Quick Communication - Home Health Verbal Orders >> Jun 23, 2019 11:41 AM [name] Stare wrote:  Michail Sermon OT with [facility]       req verbal medical 3 in  1     potty chair   walker with a seat  and compression stocking   Callback Number [id number] OT verbal   Frequency  1 x 1  2 x 2  1 x 2

## 2019-07-12 ENCOUNTER — Ambulatory Visit: Payer: Medicare Other | Admitting: Pulmonary Disease

## 2019-08-06 ENCOUNTER — Emergency Department (HOSPITAL_COMMUNITY): Payer: Medicare Other

## 2019-08-06 ENCOUNTER — Other Ambulatory Visit: Payer: Self-pay

## 2019-08-06 ENCOUNTER — Inpatient Hospital Stay (HOSPITAL_COMMUNITY)
Admission: EM | Admit: 2019-08-06 | Discharge: 2019-08-13 | DRG: 193 | Disposition: A | Discharge status: Home Health | Payer: Medicare Other | Attending: Internal Medicine | Admitting: Internal Medicine

## 2019-08-06 ENCOUNTER — Encounter (HOSPITAL_COMMUNITY): Payer: Self-pay

## 2019-08-06 DIAGNOSIS — D860 Sarcoidosis of lung: Secondary | ICD-10-CM | POA: Diagnosis present

## 2019-08-06 DIAGNOSIS — R0682 Tachypnea, not elsewhere classified: Secondary | ICD-10-CM

## 2019-08-06 DIAGNOSIS — Z886 Allergy status to analgesic agent status: Secondary | ICD-10-CM

## 2019-08-06 DIAGNOSIS — Z8249 Family history of ischemic heart disease and other diseases of the circulatory system: Secondary | ICD-10-CM

## 2019-08-06 DIAGNOSIS — J189 Pneumonia, unspecified organism: Principal | ICD-10-CM | POA: Diagnosis present

## 2019-08-06 DIAGNOSIS — R197 Diarrhea, unspecified: Secondary | ICD-10-CM | POA: Diagnosis present

## 2019-08-06 DIAGNOSIS — J8410 Pulmonary fibrosis, unspecified: Secondary | ICD-10-CM | POA: Diagnosis present

## 2019-08-06 DIAGNOSIS — Z96641 Presence of right artificial hip joint: Secondary | ICD-10-CM | POA: Diagnosis present

## 2019-08-06 DIAGNOSIS — K219 Gastro-esophageal reflux disease without esophagitis: Secondary | ICD-10-CM | POA: Diagnosis present

## 2019-08-06 DIAGNOSIS — Z888 Allergy status to other drugs, medicaments and biological substances status: Secondary | ICD-10-CM

## 2019-08-06 DIAGNOSIS — Z79899 Other long term (current) drug therapy: Secondary | ICD-10-CM

## 2019-08-06 DIAGNOSIS — Z79891 Long term (current) use of opiate analgesic: Secondary | ICD-10-CM

## 2019-08-06 DIAGNOSIS — E785 Hyperlipidemia, unspecified: Secondary | ICD-10-CM | POA: Diagnosis present

## 2019-08-06 DIAGNOSIS — M810 Age-related osteoporosis without current pathological fracture: Secondary | ICD-10-CM | POA: Diagnosis present

## 2019-08-06 DIAGNOSIS — Z6820 Body mass index (BMI) 20.0-20.9, adult: Secondary | ICD-10-CM

## 2019-08-06 DIAGNOSIS — J9601 Acute respiratory failure with hypoxia: Secondary | ICD-10-CM | POA: Diagnosis not present

## 2019-08-06 DIAGNOSIS — I272 Pulmonary hypertension, unspecified: Secondary | ICD-10-CM | POA: Diagnosis present

## 2019-08-06 DIAGNOSIS — Z833 Family history of diabetes mellitus: Secondary | ICD-10-CM

## 2019-08-06 DIAGNOSIS — Z8619 Personal history of other infectious and parasitic diseases: Secondary | ICD-10-CM

## 2019-08-06 DIAGNOSIS — J441 Chronic obstructive pulmonary disease with (acute) exacerbation: Secondary | ICD-10-CM | POA: Diagnosis not present

## 2019-08-06 DIAGNOSIS — Z9981 Dependence on supplemental oxygen: Secondary | ICD-10-CM | POA: Diagnosis not present

## 2019-08-06 DIAGNOSIS — Z803 Family history of malignant neoplasm of breast: Secondary | ICD-10-CM

## 2019-08-06 DIAGNOSIS — Z9181 History of falling: Secondary | ICD-10-CM

## 2019-08-06 DIAGNOSIS — F329 Major depressive disorder, single episode, unspecified: Secondary | ICD-10-CM | POA: Diagnosis present

## 2019-08-06 DIAGNOSIS — Z20822 Contact with and (suspected) exposure to covid-19: Secondary | ICD-10-CM | POA: Diagnosis present

## 2019-08-06 DIAGNOSIS — Z7952 Long term (current) use of systemic steroids: Secondary | ICD-10-CM

## 2019-08-06 DIAGNOSIS — H5462 Unqualified visual loss, left eye, normal vision right eye: Secondary | ICD-10-CM | POA: Diagnosis present

## 2019-08-06 DIAGNOSIS — J9621 Acute and chronic respiratory failure with hypoxia: Secondary | ICD-10-CM | POA: Diagnosis present

## 2019-08-06 DIAGNOSIS — Z823 Family history of stroke: Secondary | ICD-10-CM

## 2019-08-06 DIAGNOSIS — I10 Essential (primary) hypertension: Secondary | ICD-10-CM | POA: Diagnosis present

## 2019-08-06 DIAGNOSIS — Z880 Allergy status to penicillin: Secondary | ICD-10-CM

## 2019-08-06 DIAGNOSIS — R64 Cachexia: Secondary | ICD-10-CM | POA: Diagnosis present

## 2019-08-06 DIAGNOSIS — E876 Hypokalemia: Secondary | ICD-10-CM | POA: Diagnosis present

## 2019-08-06 DIAGNOSIS — Z87891 Personal history of nicotine dependence: Secondary | ICD-10-CM

## 2019-08-06 DIAGNOSIS — J439 Emphysema, unspecified: Secondary | ICD-10-CM | POA: Diagnosis present

## 2019-08-06 DIAGNOSIS — E871 Hypo-osmolality and hyponatremia: Secondary | ICD-10-CM | POA: Diagnosis present

## 2019-08-06 DIAGNOSIS — Z825 Family history of asthma and other chronic lower respiratory diseases: Secondary | ICD-10-CM

## 2019-08-06 DIAGNOSIS — E860 Dehydration: Secondary | ICD-10-CM | POA: Diagnosis present

## 2019-08-06 DIAGNOSIS — D869 Sarcoidosis, unspecified: Secondary | ICD-10-CM | POA: Diagnosis present

## 2019-08-06 DIAGNOSIS — Z9851 Tubal ligation status: Secondary | ICD-10-CM

## 2019-08-06 DIAGNOSIS — N179 Acute kidney failure, unspecified: Secondary | ICD-10-CM | POA: Diagnosis present

## 2019-08-06 DIAGNOSIS — R32 Unspecified urinary incontinence: Secondary | ICD-10-CM | POA: Diagnosis present

## 2019-08-06 DIAGNOSIS — R0602 Shortness of breath: Secondary | ICD-10-CM | POA: Diagnosis present

## 2019-08-06 DIAGNOSIS — Z9841 Cataract extraction status, right eye: Secondary | ICD-10-CM | POA: Diagnosis not present

## 2019-08-06 DIAGNOSIS — Z792 Long term (current) use of antibiotics: Secondary | ICD-10-CM

## 2019-08-06 LAB — CBC
HCT: 33.9 % — ABNORMAL LOW (ref 36.0–46.0)
Hemoglobin: 11.5 g/dL — ABNORMAL LOW (ref 12.0–15.0)
MCH: 28.5 pg (ref 26.0–34.0)
MCHC: 33.9 g/dL (ref 30.0–36.0)
MCV: 84.1 fL (ref 80.0–100.0)
Platelets: 309 10*3/uL (ref 150–400)
RBC: 4.03 MIL/uL (ref 3.87–5.11)
RDW: 17.7 % — ABNORMAL HIGH (ref 11.5–15.5)
WBC: 7.2 10*3/uL (ref 4.0–10.5)
nRBC: 0 % (ref 0.0–0.2)

## 2019-08-06 LAB — BASIC METABOLIC PANEL
Anion gap: 14 (ref 5–15)
BUN: 12 mg/dL (ref 8–23)
CO2: 25 mmol/L (ref 22–32)
Calcium: 8.2 mg/dL — ABNORMAL LOW (ref 8.9–10.3)
Chloride: 82 mmol/L — ABNORMAL LOW (ref 98–111)
Creatinine, Ser: 0.81 mg/dL (ref 0.44–1.00)
GFR calc Af Amer: >60 mL/min (ref >60)
GFR calc non Af Amer: >60 mL/min (ref >60)
Glucose, Bld: 89 mg/dL (ref 70–99)
Potassium: 2.8 mmol/L — ABNORMAL LOW (ref 3.5–5.1)
Sodium: 121 mmol/L — ABNORMAL LOW (ref 135–145)

## 2019-08-06 LAB — CREATININE, SERUM
Creatinine, Ser: 0.76 mg/dL (ref 0.44–1.00)
GFR calc Af Amer: >60 mL/min (ref >60)
GFR calc non Af Amer: >60 mL/min (ref >60)

## 2019-08-06 LAB — POC SARS CORONAVIRUS 2 AG: SARS Coronavirus 2 Ag: NEGATIVE

## 2019-08-06 MED ORDER — SODIUM CHLORIDE 0.9 % IV BOLUS (SEPSIS)
1000.0000 mL | Freq: Once | INTRAVENOUS | Status: AC
  Administered 2019-08-06: 1000 mL via INTRAVENOUS

## 2019-08-06 MED ORDER — SODIUM CHLORIDE 0.9 % IV SOLN
1.0000 g | Freq: Once | INTRAVENOUS | Status: AC
  Administered 2019-08-07: 1 g via INTRAVENOUS
  Filled 2019-08-06: qty 10

## 2019-08-06 MED ORDER — AZITHROMYCIN 250 MG PO TABS
500.0000 mg | ORAL_TABLET | Freq: Once | ORAL | Status: AC
  Administered 2019-08-06: 23:00:00 500 mg via ORAL
  Filled 2019-08-06: qty 2

## 2019-08-06 MED ORDER — ALBUTEROL SULFATE HFA 108 (90 BASE) MCG/ACT IN AERS
4.0000 | INHALATION_SPRAY | Freq: Once | RESPIRATORY_TRACT | Status: AC
  Administered 2019-08-06: 23:00:00 4 via RESPIRATORY_TRACT
  Filled 2019-08-06: qty 6.7

## 2019-08-06 MED ORDER — POTASSIUM CHLORIDE CRYS ER 20 MEQ PO TBCR
40.0000 meq | EXTENDED_RELEASE_TABLET | Freq: Once | ORAL | Status: AC
  Administered 2019-08-06: 23:00:00 40 meq via ORAL
  Filled 2019-08-06: qty 2

## 2019-08-06 MED ORDER — METHYLPREDNISOLONE SODIUM SUCC 125 MG IJ SOLR
125.0000 mg | Freq: Once | INTRAMUSCULAR | Status: AC
  Administered 2019-08-06: 125 mg via INTRAVENOUS
  Filled 2019-08-06: qty 2

## 2019-08-06 MED ORDER — SODIUM CHLORIDE 0.9 % IV SOLN: 1000.0000 mL | INTRAVENOUS | Status: DC

## 2019-08-06 NOTE — ED Notes (Signed)
2 unsuccessful attempts at Iv accesses.

## 2019-08-06 NOTE — ED Notes (Signed)
Pt assisted to bedside toilet

## 2019-08-06 NOTE — ED Triage Notes (Signed)
Pt has hx of COPD. Pt c/o SHOB for several days. Pt states she has chest tightness when walking. Pt states no relief with albuterol tx.

## 2019-08-06 NOTE — ED Provider Notes (Signed)
[facility] DEPT Provider Note   CSN: [id number] Arrival date & time: 08/06/19  1508     History Chief Complaint  Patient presents with  . Shortness of Breath    [name] is a 73 y.o. female.  HPI   Patient presents emergency room with complaints of shortness of breath.  Patient states her symptoms started several days ago.  She has been coughing.  She has had low-grade fevers.  She has been using her albuterol treatments without much relief.  Patient states her chest feels tight when she is walking and she gets more short of breath.  She does have a history of COPD.  Patient denies any Covid exposure.  Past Medical History:  Diagnosis Date  . Blind left eye   . Cataract of both eyes 01/18/2009   Right surgically repaired  . 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  . Depression 06/08/2007  . Essential hypertension, benign 06/08/2007  . GERD 01/18/2009  . Osteoporosis 12/31/2012   Left femur had T score -2.5 on DEXA scan 02/26/2009.   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 Sarcoidosis 02/05/2009   PFTs 03/30/2009: FVC 67%, FEV1 48%, FEV1/FVC 52%, TLC 104%, DLCO 71%.    Patient Active Problem List   Diagnosis Date Noted  . Acute respiratory failure with hypoxia ([place])   . Shock ([place])   . Central venous catheter in place   . AKI (acute kidney injury) ([place])   . Prolonged QT interval   . Pulmonary hypertension ([place])   . Hyponatremia   . Hypoglycemia   . Encephalopathy acute   . Severe sepsis ([place]) 05/31/2019  . Low bone mass 05/24/2018  . IBS (irritable bowel syndrome) 03/01/2018  . Diarrhea 12/22/2017  . Falls 12/26/2015  . Mixed incontinence urge and stress 12/26/2015  . Hypokalemia 10/03/2015  . Encounter for central line placement 09/12/2014  . Dyslipidemia 12/31/2012  . Allergic rhinitis 12/31/2012  . Osteoarthritis, hip, bilateral  11/01/2010  . Unintentional weight loss 03/19/2009  . 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 Sarcoidosis 02/05/2009  . GERD 01/18/2009  . Essential hypertension, benign 06/08/2007    Past Surgical History:  Procedure Laterality Date  . CATARACT EXTRACTION Right   . COLONOSCOPY  2002  . HIP ARTHROPLASTY Right   . TUBAL LIGATION       OB History   No obstetric history on file.     Family History  Problem Relation Age of Onset  . Cancer Mother   . Heart disease Mother   . Breast cancer Mother   . Stroke Father   . Cancer Sister   . Heart disease Sister   . Asthma Daughter   . Cancer Maternal Aunt   . Cancer Other   . Diabetes Other   . Hypertension Other   . Colon cancer Neg Hx     Social History   Tobacco Use  . Smoking status: Former Smoker    Packs/day: 0.25    Years: 30.00    Pack years: 7.50    Quit date: 05/11/2010    Years since quitting: 9.2  . Smokeless tobacco: Never Used  Substance Use Topics  . Alcohol use: Yes    Alcohol/week: 0.0 standard drinks    Comment: wine cooler.  . Drug use: No    Home Medications Prior to Admission medications   Medication Sig Start  Date End Date Taking? Authorizing Provider  albuterol (PROVENTIL) (2.5 MG/3ML) 0.083% nebulizer solution Take 3 mLs (2.5 mg total) by nebulization every 6 (six) hours as needed. As needed for SOB 04/15/19   Isaac Bliss, Rayford Halsted, MD  albuterol (VENTOLIN HFA) 108 (90 Base) MCG/ACT inhaler Inhale 1-2 puffs into the lungs every 6 (six) hours as needed for wheezing or shortness of breath. 04/15/19 04/14/20  Isaac Bliss, Rayford Halsted, MD  amLODipine (NORVASC) 5 MG tablet Take 1 tablet (5 mg total) by mouth daily. 02/22/19 02/22/20  Isaac Bliss, Rayford Halsted, MD  doxycycline (VIBRAMYCIN) 100 MG capsule Take 100 mg by mouth 2 (two) times daily.    [provider]  etodolac (LODINE XL) 400 MG 24 hr tablet Take 400 mg by mouth daily.    [provider]  gabapentin (NEURONTIN) 100 MG  capsule Take 100 mg by mouth at bedtime.    [provider]  hydrochlorothiazide (HYDRODIURIL) 25 MG tablet Take 25 mg by mouth daily.    [provider]  HYDROcodone-acetaminophen (NORCO/VICODIN) 5-325 MG tablet Take 1 tablet by mouth every 6 (six) hours as needed for moderate pain.    [provider]  ipratropium-albuterol (DUONEB) 0.5-2.5 (3) MG/3ML SOLN USE 3 ML VIA NEBULIZER EVERY 4 HOURS AS NEEDED Patient taking differently: Take 3 mLs by nebulization every 4 (four) hours as needed (sob).  06/18/18   Laurin Coder, MD  Magnesium 500 MG TABS Take 500 mg by mouth at bedtime.    [provider]  metoprolol succinate (TOPROL-XL) 50 MG 24 hr tablet TAKE 1 TABLET BY MOUTH EVERY DAY TAKE WITH OR IMMEDIATELY FOLLOWING A MEAL. 06/14/19   Isaac Bliss, Rayford Halsted, MD  mirtazapine (REMERON) 7.5 MG tablet Take 3.75 mg by mouth at bedtime. 07/20/19   [provider]  pantoprazole (PROTONIX) 40 MG tablet Take 1 tablet (40 mg total) by mouth daily. 02/22/19   Isaac Bliss, Rayford Halsted, MD  potassium citrate (UROCIT-K) 10 MEQ (1080 MG) SR tablet Take 10 mEq by mouth daily.    [provider]  pravastatin (PRAVACHOL) 40 MG tablet TAKE 1 TABLET BY MOUTH EVERY DAY IN THE EVENING 02/22/19   Isaac Bliss, Rayford Halsted, MD  predniSONE (DELTASONE) 20 MG tablet Take 20 mg by mouth 2 (two) times daily with a meal.    [provider]  sertraline (ZOLOFT) 25 MG tablet Take 25 mg by mouth at bedtime. 07/19/19   [provider]  Vitamin D, Ergocalciferol, (DRISDOL) 1.25 MG (50000 UT) CAPS capsule Take 50,000 Units by mouth once a week. 05/06/19   [provider]    Allergies    Ace inhibitors, Lisinopril, Valsartan, Aspirin, and Penicillins  Review of Systems   Review of Systems  All other systems reviewed and are negative.   Physical Exam Updated Vital Signs BP 129/71 (BP Location: Left Arm)   Pulse (!) 120   Temp 100.1 F (37.8  C) (Oral)   Resp (!) 22   SpO2 97%   Physical Exam Vitals and nursing note reviewed.  Constitutional:      General: She is not in acute distress.    Appearance: She is well-developed.  HENT:     Head: Normocephalic and atraumatic.     Right Ear: External ear normal.     Left Ear: External ear normal.  Eyes:     General: No scleral icterus.       Right eye: No discharge.  Left eye: No discharge.     Conjunctiva/sclera: Conjunctivae normal.  Neck:     Trachea: No tracheal deviation.  Cardiovascular:     Rate and Rhythm: Normal rate and regular rhythm.  Pulmonary:     Effort: Tachypnea present. No respiratory distress.     Breath sounds: No stridor. Wheezing present. No rales.     Comments: Able speak in full sentences Abdominal:     General: Bowel sounds are normal. There is no distension.     Palpations: Abdomen is soft.     Tenderness: There is no abdominal tenderness. There is no guarding or rebound.  Musculoskeletal:        General: No tenderness.     Cervical back: Neck supple.     Right lower leg: No tenderness. No edema.     Left lower leg: No tenderness. No edema.  Skin:    General: Skin is warm and dry.     Findings: No rash.  Neurological:     Mental Status: She is alert.     Cranial Nerves: No cranial nerve deficit (no facial droop, extraocular movements intact, no slurred speech).     Sensory: No sensory deficit.     Motor: No abnormal muscle tone or seizure activity.     Coordination: Coordination normal.     ED Results / Procedures / Treatments   Labs (all labs ordered are listed, but only abnormal results are displayed) Labs Reviewed  BASIC METABOLIC PANEL - Abnormal; Notable for the following components:      Result Value   Sodium 121 (*)    Potassium 2.8 (*)    Chloride 82 (*)    Calcium 8.2 (*)    All other components within normal limits  CBC - Abnormal; Notable for the following components:   Hemoglobin 11.5 (*)    HCT 33.9 (*)     RDW 17.7 (*)    All other components within normal limits  RESPIRATORY PANEL BY RT PCR (FLU A&B, COVID)  POC SARS CORONAVIRUS 2 AG    EKG EKG Interpretation  Date/Time:  Saturday August 06 2019 15:22:23 EST Ventricular Rate:  105 PR Interval:    QRS Duration: 91 QT Interval:  361 QTC Calculation: 478 R Axis:   2 Text Interpretation: Sinus tachycardia Probable left atrial enlargement No significant change since last tracing Confirmed by Dorie Rank [phone]) on 08/06/2019 8:16:27 PM   Radiology DG Chest 2 View  Result Date: 08/06/2019 CLINICAL DATA:  Shortness of breath and chest tightness. History of chronic lung disease. EXAM: CHEST - 2 VIEW COMPARISON:  06/03/2019 as well as multiple prior x-ray and CT studies. FINDINGS: The heart size is stable. Superimposed on severe fibrotic lung disease are potentially some new infiltrates especially in the mid to lower lung zones bilaterally. No pneumothorax or significant pleural fluid. IMPRESSION: Superimposed on severe fibrotic lung disease are potentially new infiltrates in the mid to lower lung zones bilaterally. Electronically Signed   By: Aletta Edouard M.D.   On: 08/06/2019 15:54    Procedures Procedures (including critical care time)  Medications Ordered in ED Medications  methylPREDNISolone sodium succinate (SOLU-MEDROL) 125 mg/2 mL injection 125 mg (has no administration in time range)  albuterol (VENTOLIN HFA) 108 (90 Base) MCG/ACT inhaler 4 puff (has no administration in time range)  cefTRIAXone (ROCEPHIN) 1 g in sodium chloride 0.9 % 100 mL IVPB (has no administration in time range)  azithromycin (ZITHROMAX) tablet 500 mg (has no administration in time range)  potassium chloride SA (KLOR-CON) CR tablet 40 mEq (has no administration in time range)  sodium chloride 0.9 % bolus 1,000 mL (has no administration in time range)    Followed by  0.9 %  sodium chloride infusion (has no administration in time range)    ED Course  I  have reviewed the triage vital signs and the nursing notes.  Pertinent labs & imaging results that were available during my care of the patient were reviewed by me and considered in my medical decision making (see chart for details).  Clinical Course as of Aug 05 2106  Sat Aug 06, 2019  2056 Chest x-ray shows evidence of pneumonia.  Laboratory tests are notable for hypokalemia and hyponatremia.   [JK]  2059 Persistent tachycardia.  IV fluid bolus ordered.   [JK]    Clinical Course User Index [JK] Dorie Rank, MD   MDM Rules/Calculators/A&P                      Patient presented to emergency room with shortness of breath.  Chest x-ray suggestive of pneumonia.  Initial rapid Covid test is negative.  Patient also has electrolyte abnormalities of hypokalemia and hyponatremia.  She is mentating clearly.  I have ordered IV saline as well as oral potassium.  Patient has been given a dose of Solu-Medrol for her wheezing.  I have ordered Rocephin and azithromycin.  Considering her persistent wheezing and electrolyte abnormalities I will consult with the medical service for admission and further treatment.  ERIYANNA KIO was evaluated in Emergency Department on 08/06/2019 for the symptoms described in the history of present illness. She was evaluated in the context of the global COVID-19 pandemic, which necessitated consideration that the patient might be at risk for infection with the SARS-CoV-2 virus that causes COVID-19. Institutional protocols and algorithms that pertain to the evaluation of patients at risk for COVID-19 are in a state of rapid change based on information released by regulatory bodies including the CDC and federal and state organizations. These policies and algorithms were followed during the patient's care in the ED.  Final Clinical Impression(s) / ED Diagnoses Final diagnoses:  Pneumonia of both lungs due to infectious organism, unspecified part of lung  COPD exacerbation  ([place])  Hypokalemia  Hyponatremia      Dorie Rank, MD 08/06/19 2108

## 2019-08-06 NOTE — H&P (Signed)
History and Physical   [name] [id number] DOB: Oct 30, 1945 DOA: 08/06/2019  Referring MD/NP/PA: Dr. Dorie Rank  PCP: Isaac Bliss, Rayford Halsted, MD   Outpatient Specialists: None  Patient coming from: Home  Chief Complaint: Shortness of breath  HPI: [name] is a 73 y.o. female with medical history significant of COPD, left eye blindness, hypertension, depression, osteoporosis and sarcoidosis who presented to the ER with significant shortness of breath cough and wheezing.  Symptoms have been progressive over the last 3 days.  She has been taking her treatment at home with no relief.  She has had cough which is now productive.  Also low-grade fever.  Denied any known contact with COVID-19 patients.  Patient was seen and evaluated.  Her Covid screen was negative.  She had markedly expiratory wheezing.  Also patient noted to have pneumonia on chest x-ray so she is being admitted with acute on chronic respiratory failure secondary to COPD exacerbation and pneumonia..  ED Course: Temperature 100.6 blood pressure 145/90 pulse 120 respirate 24 oxygen sat 90% on room air 97% on 2 L.  White count 7.2 hemoglobin 11.5 and platelets 309.  Sodium is 121 potassium 2.8 chloride 82 CO2 25 BUN 12 creatinine 0.77 calcium 8.2.  Glucose 89.  Chest x-ray showed bilateral infiltrates.  Patient initiated on treatment and being admitted.  Review of Systems: As per HPI otherwise 10 point review of systems negative.    Past Medical History:  Diagnosis Date  . Blind left eye   . Cataract of both eyes 01/18/2009   Right surgically repaired  . COPD with emphysema ([place]) 02/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  . Depression 06/08/2007  . Essential hypertension, benign 06/08/2007  . GERD 01/18/2009  . Osteoporosis 12/31/2012   Left femur had T score -2.5 on DEXA scan 02/26/2009.   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 Sarcoidosis  02/05/2009   PFTs 03/30/2009: FVC 67%, FEV1 48%, FEV1/FVC 52%, TLC 104%, DLCO 71%.    Past Surgical History:  Procedure Laterality Date  . CATARACT EXTRACTION Right   . COLONOSCOPY  2002  . HIP ARTHROPLASTY Right   . TUBAL LIGATION       reports that she quit smoking about 9 years ago. She has a 7.50 pack-year smoking history. She has never used smokeless tobacco. She reports current alcohol use. She reports that she does not use drugs.  Allergies  Allergen Reactions  . Ace Inhibitors Swelling  . Lisinopril Swelling    REACTION: Angioedema  . Valsartan Swelling    REACTION: angioedema.  Pt should not get ACEI or ARBs of any kind due to angioedema.  . Aspirin Other (See Comments)    REACTION: GI ulcer.  . Penicillins     Childhood allergy Has patient had a PCN reaction causing immediate rash, facial/tongue/throat swelling, SOB or lightheadedness with hypotension: Unknown Has patient had a PCN reaction causing severe rash involving mucus membranes or skin necrosis: Unknown Has patient had a PCN reaction that required hospitalization: Unknown Has patient had a PCN reaction occurring within the last 10 years: Unknown If all of the above answers are "NO", then may proceed with Cephalosporin use.     Family History  Problem Relation Age of Onset  . Cancer Mother   . Heart disease Mother   . Breast cancer Mother   . Stroke Father   . Cancer Sister   .  Heart disease Sister   . Asthma Daughter   . Cancer Maternal Aunt   . Cancer Other   . Diabetes Other   . Hypertension Other   . Colon cancer Neg Hx      Prior to Admission medications   Medication Sig Start Date End Date Taking? Authorizing Provider  albuterol (PROVENTIL) (2.5 MG/3ML) 0.083% nebulizer solution Take 3 mLs (2.5 mg total) by nebulization every 6 (six) hours as needed. As needed for SOB 04/15/19  Yes Isaac Bliss, Rayford Halsted, MD  albuterol (VENTOLIN HFA) 108 (90 Base) MCG/ACT inhaler Inhale 1-2 puffs into the  lungs every 6 (six) hours as needed for wheezing or shortness of breath. 04/15/19 04/14/20 Yes Erline Hau, MD  amLODipine (NORVASC) 5 MG tablet Take 1 tablet (5 mg total) by mouth daily. 02/22/19 02/22/20 Yes Erline Hau, MD  doxycycline (VIBRAMYCIN) 100 MG capsule Take 100 mg by mouth 2 (two) times daily.   Yes [provider]  etodolac (LODINE XL) 400 MG 24 hr tablet Take 400 mg by mouth daily.   Yes [provider]  gabapentin (NEURONTIN) 100 MG capsule Take 100 mg by mouth at bedtime.   Yes [provider]  hydrochlorothiazide (HYDRODIURIL) 25 MG tablet Take 25 mg by mouth daily.   Yes [provider]  HYDROcodone-acetaminophen (NORCO/VICODIN) 5-325 MG tablet Take 1 tablet by mouth every 6 (six) hours as needed for moderate pain.   Yes [provider]  ipratropium-albuterol (DUONEB) 0.5-2.5 (3) MG/3ML SOLN USE 3 ML VIA NEBULIZER EVERY 4 HOURS AS NEEDED Patient taking differently: Take 3 mLs by nebulization every 4 (four) hours as needed (sob).  06/18/18  Yes Olalere, Adewale A, MD  Magnesium 500 MG TABS Take 500 mg by mouth at bedtime.   Yes [provider]  metoprolol succinate (TOPROL-XL) 50 MG 24 hr tablet TAKE 1 TABLET BY MOUTH EVERY DAY TAKE WITH OR IMMEDIATELY FOLLOWING A MEAL. Patient taking differently: Take 50 mg by mouth daily.  06/14/19  Yes Isaac Bliss, Rayford Halsted, MD  mirtazapine (REMERON) 7.5 MG tablet Take 3.75 mg by mouth at bedtime. 07/20/19  Yes [provider]  pantoprazole (PROTONIX) 40 MG tablet Take 1 tablet (40 mg total) by mouth daily. 02/22/19  Yes Isaac Bliss, Rayford Halsted, MD  potassium citrate (UROCIT-K) 10 MEQ (1080 MG) SR tablet Take 10 mEq by mouth daily.   Yes [provider]  pravastatin (PRAVACHOL) 40 MG tablet TAKE 1 TABLET BY MOUTH EVERY DAY IN THE EVENING Patient taking differently: Take 40 mg by mouth daily.  02/22/19  Yes Isaac Bliss, Rayford Halsted, MD    predniSONE (DELTASONE) 20 MG tablet Take 20 mg by mouth 2 (two) times daily with a meal.   Yes [provider]  sertraline (ZOLOFT) 25 MG tablet Take 25 mg by mouth at bedtime. 07/19/19  Yes [provider]  Vitamin D, Ergocalciferol, (DRISDOL) 1.25 MG (50000 UT) CAPS capsule Take 50,000 Units by mouth once a week. 05/06/19  Yes [provider]    Physical Exam: Vitals:   08/06/19 1523 08/06/19 1930 08/06/19 1950  BP: 109/65 (!) 145/90 129/71  Pulse: (!) 105 (!) 111 (!) 120  Resp: (!) 22 18 (!) 22  Temp: 99.5 F (37.5 C) (!) 100.6 F (38.1 C) 100.1 F (37.8 C)  TempSrc: Oral Oral Oral  SpO2: 93% 90% 97%      Constitutional: Anxious and in respiratory distress, cachectic Vitals:   08/06/19 1523 08/06/19 1930  08/06/19 1950  BP: 109/65 (!) 145/90 129/71  Pulse: (!) 105 (!) 111 (!) 120  Resp: (!) 22 18 (!) 22  Temp: 99.5 F (37.5 C) (!) 100.6 F (38.1 C) 100.1 F (37.8 C)  TempSrc: Oral Oral Oral  SpO2: 93% 90% 97%   Eyes: PERRL, lids and conjunctivae normal, left eye blindness with bilateral cataract ENMT: Mucous membranes are moist. Posterior pharynx clear of any exudate or lesions.Normal dentition.  Neck: normal, supple, no masses, no thyromegaly Respiratory: Decreased air entry bilaterally, marked expiratory wheezing with rhonchi, use of accessory muscle use.  Cardiovascular: Sinus tachycardia, no murmurs / rubs / gallops. No extremity edema. 2+ pedal pulses. No carotid bruits.  Abdomen: no tenderness, no masses palpated. No hepatosplenomegaly. Bowel sounds positive.  Musculoskeletal: no clubbing / cyanosis. No joint deformity upper and lower extremities. Good ROM, no contractures. Normal muscle tone.  Skin: no rashes, lesions, ulcers. No induration Neurologic: CN 2-12 grossly intact. Sensation intact, DTR normal. Strength 5/5 in all 4.  Psychiatric: Normal judgment and insight. Alert and oriented x 3. Normal mood.     Labs on Admission: I  have personally reviewed following labs and imaging studies  CBC: Recent Labs  Lab 08/06/19 1935  WBC 7.2  HGB 11.5*  HCT 33.9*  MCV 84.1  PLT [id number]   Basic Metabolic Panel: Recent Labs  Lab 08/06/19 1935  NA 121*  K 2.8*  CL 82*  CO2 25  GLUCOSE 89  BUN 12  CREATININE 0.81  CALCIUM 8.2*   GFR: CrCl cannot be calculated (Unknown ideal weight.). Liver Function Tests: No results for input(s): AST, ALT, ALKPHOS, BILITOT, PROT, ALBUMIN in the last 168 hours. No results for input(s): LIPASE, AMYLASE in the last 168 hours. No results for input(s): AMMONIA in the last 168 hours. Coagulation Profile: No results for input(s): INR, PROTIME in the last 168 hours. Cardiac Enzymes: No results for input(s): CKTOTAL, CKMB, CKMBINDEX, TROPONINI in the last 168 hours. BNP (last 3 results) No results for input(s): PROBNP in the last 8760 hours. HbA1C: No results for input(s): HGBA1C in the last 72 hours. CBG: No results for input(s): GLUCAP in the last 168 hours. Lipid Profile: No results for input(s): CHOL, HDL, LDLCALC, TRIG, CHOLHDL, LDLDIRECT in the last 72 hours. Thyroid Function Tests: No results for input(s): TSH, T4TOTAL, FREET4, T3FREE, THYROIDAB in the last 72 hours. Anemia Panel: No results for input(s): VITAMINB12, FOLATE, FERRITIN, TIBC, IRON, RETICCTPCT in the last 72 hours. Urine analysis:    Component Value Date/Time   COLORURINE YELLOW 06/01/2019 0116   APPEARANCEUR CLOUDY (A) 06/01/2019 0116   LABSPEC 1.017 06/01/2019 0116   PHURINE 5.0 06/01/2019 0116   GLUCOSEU NEGATIVE 06/01/2019 0116   GLUCOSEU NEG mg/dL 01/18/2009 1723   HGBUR NEGATIVE 06/01/2019 0116   BILIRUBINUR MODERATE (A) 06/01/2019 0116   KETONESUR NEGATIVE 06/01/2019 0116   PROTEINUR NEGATIVE 06/01/2019 0116   UROBILINOGEN 0.2 12/16/2010 1802   NITRITE NEGATIVE 06/01/2019 0116   LEUKOCYTESUR TRACE (A) 06/01/2019 0116   Sepsis Labs: @LABRCNTIP (procalcitonin:4,lacticidven:4) )No results found  for this or any previous visit (from the past 240 hour(s)).   Radiological Exams on Admission: DG Chest 2 View  Result Date: 08/06/2019 CLINICAL DATA:  Shortness of breath and chest tightness. History of chronic lung disease. EXAM: CHEST - 2 VIEW COMPARISON:  06/03/2019 as well as multiple prior x-ray and CT studies. FINDINGS: The heart size is stable. Superimposed on severe fibrotic lung disease are potentially some new infiltrates especially in the mid  to lower lung zones bilaterally. No pneumothorax or significant pleural fluid. IMPRESSION: Superimposed on severe fibrotic lung disease are potentially new infiltrates in the mid to lower lung zones bilaterally. Electronically Signed   By: Aletta Edouard M.D.   On: 08/06/2019 15:54    EKG: Independently reviewed.  Shows sinus tachycardia  Assessment/Plan Principal Problem:   Acute respiratory failure with hypoxia (HCC) Active Problems:   Sarcoidosis   Essential hypertension, benign   GERD   Hypokalemia   AKI (acute kidney injury) ([place])   Hyponatremia   CAP (community acquired pneumonia)     #1 acute respiratory failure with hypoxia: Secondary to pneumonia and COPD exacerbation.  Initiate IV steroids breathing treatment, antibiotics as well as oxygen.  #2 COPD exacerbation: Failed outpatient treatment.  Continue treatment as above.  #3 pneumonia: Bilateral pneumonia.  COVID-19 so far negative.  Continue treatment.  #4 hyponatremia: Most likely due to dehydration.  Aggressively hydrate with saline.  #5 hypokalemia: Treated with potassium replacement.  Continue to monitor  #6 hypertension: Resume home regimen and treat.  #7 GERD: Continue with PPIs  #8 sarcoidosis: Stable.  Continue home regimen  #9 acute kidney injury: Secondary to dehydration.   DVT prophylaxis: Lovenox Code Status: Full Family Communication: No family at bedside Disposition Plan: Home Consults called: None Admission status: Inpatient  Severity of  Illness: The appropriate patient status for this patient is INPATIENT. Inpatient status is judged to be reasonable and necessary in order to provide the required intensity of service to ensure the patient's safety. The patient's presenting symptoms, physical exam findings, and initial radiographic and laboratory data in the context of their chronic comorbidities is felt to place them at high risk for further clinical deterioration. Furthermore, it is not anticipated that the patient will be medically stable for discharge from the hospital within 2 midnights of admission. The following factors support the patient status of inpatient.   " The patient's presenting symptoms include shortness of breath. " The worrisome physical exam findings include bilateral wheeze. " The initial radiographic and laboratory data are worrisome because of pneumonia bilaterally. " The chronic co-morbidities include COPD.   * I certify that at the point of admission it is my clinical judgment that the patient will require inpatient hospital care spanning beyond 2 midnights from the point of admission due to high intensity of service, high risk for further deterioration and high frequency of surveillance required.Barbette Merino MD [facility] Pager [phone]  If 7PM-7AM, please contact night-coverage [email or web address] Password [facility]  08/06/2019, 9:36 PM

## 2019-08-07 ENCOUNTER — Inpatient Hospital Stay (HOSPITAL_COMMUNITY): Payer: Medicare Other

## 2019-08-07 DIAGNOSIS — I10 Essential (primary) hypertension: Secondary | ICD-10-CM

## 2019-08-07 DIAGNOSIS — J441 Chronic obstructive pulmonary disease with (acute) exacerbation: Secondary | ICD-10-CM

## 2019-08-07 DIAGNOSIS — N179 Acute kidney failure, unspecified: Secondary | ICD-10-CM

## 2019-08-07 LAB — COMPREHENSIVE METABOLIC PANEL
ALT: 14 U/L (ref 0–44)
AST: 21 U/L (ref 15–41)
Albumin: 2.3 g/dL — ABNORMAL LOW (ref 3.5–5.0)
Alkaline Phosphatase: 96 U/L (ref 38–126)
Anion gap: 11 (ref 5–15)
BUN: 11 mg/dL (ref 8–23)
CO2: 24 mmol/L (ref 22–32)
Calcium: 8 mg/dL — ABNORMAL LOW (ref 8.9–10.3)
Chloride: 94 mmol/L — ABNORMAL LOW (ref 98–111)
Creatinine, Ser: 0.63 mg/dL (ref 0.44–1.00)
GFR calc Af Amer: >60 mL/min (ref >60)
GFR calc non Af Amer: >60 mL/min (ref >60)
Glucose, Bld: 112 mg/dL — ABNORMAL HIGH (ref 70–99)
Potassium: 3.7 mmol/L (ref 3.5–5.1)
Sodium: 129 mmol/L — ABNORMAL LOW (ref 135–145)
Total Bilirubin: 0.6 mg/dL (ref 0.3–1.2)
Total Protein: 5.8 g/dL — ABNORMAL LOW (ref 6.5–8.1)

## 2019-08-07 LAB — HIV ANTIBODY (ROUTINE TESTING W REFLEX): HIV Screen 4th Generation wRfx: NONREACTIVE

## 2019-08-07 LAB — CBC
HCT: 29.8 % — ABNORMAL LOW (ref 36.0–46.0)
Hemoglobin: 9.8 g/dL — ABNORMAL LOW (ref 12.0–15.0)
MCH: 28.3 pg (ref 26.0–34.0)
MCHC: 32.9 g/dL (ref 30.0–36.0)
MCV: 86.1 fL (ref 80.0–100.0)
Platelets: 258 10*3/uL (ref 150–400)
RBC: 3.46 MIL/uL — ABNORMAL LOW (ref 3.87–5.11)
RDW: 18 % — ABNORMAL HIGH (ref 11.5–15.5)
WBC: 3.7 10*3/uL — ABNORMAL LOW (ref 4.0–10.5)
nRBC: 0 % (ref 0.0–0.2)

## 2019-08-07 LAB — RESPIRATORY PANEL BY RT PCR (FLU A&B, COVID)
Influenza A by PCR: NEGATIVE
Influenza B by PCR: NEGATIVE
SARS Coronavirus 2 by RT PCR: NEGATIVE

## 2019-08-07 LAB — STREP PNEUMONIAE URINARY ANTIGEN: Strep Pneumo Urinary Antigen: NEGATIVE

## 2019-08-07 MED ORDER — ENOXAPARIN SODIUM 40 MG/0.4ML ~~LOC~~ SOLN
40.0000 mg | Freq: Every day | SUBCUTANEOUS | Status: DC
  Administered 2019-08-07 – 2019-08-13 (×7): 40 mg via SUBCUTANEOUS
  Filled 2019-08-07 (×7): qty 0.4

## 2019-08-07 MED ORDER — ORAL CARE MOUTH RINSE
15.0000 mL | Freq: Two times a day (BID) | OROMUCOSAL | Status: DC
  Administered 2019-08-07 – 2019-08-13 (×10): 15 mL via OROMUCOSAL

## 2019-08-07 MED ORDER — SODIUM CHLORIDE 0.9 % IV SOLN
1.0000 g | INTRAVENOUS | Status: DC
  Administered 2019-08-07 – 2019-08-10 (×4): 1 g via INTRAVENOUS
  Filled 2019-08-07 (×4): qty 1

## 2019-08-07 MED ORDER — PRAVASTATIN SODIUM 40 MG PO TABS
40.0000 mg | ORAL_TABLET | Freq: Every day | ORAL | Status: DC
  Administered 2019-08-07 – 2019-08-13 (×7): 40 mg via ORAL
  Filled 2019-08-07 (×7): qty 1

## 2019-08-07 MED ORDER — HYDROCODONE-ACETAMINOPHEN 5-325 MG PO TABS
1.0000 | ORAL_TABLET | Freq: Four times a day (QID) | ORAL | Status: DC | PRN
  Administered 2019-08-08 – 2019-08-11 (×4): 1 via ORAL
  Filled 2019-08-07 (×4): qty 1

## 2019-08-07 MED ORDER — METOPROLOL SUCCINATE ER 50 MG PO TB24
50.0000 mg | ORAL_TABLET | Freq: Every day | ORAL | Status: DC
  Administered 2019-08-07 – 2019-08-13 (×7): 50 mg via ORAL
  Filled 2019-08-07 (×7): qty 1

## 2019-08-07 MED ORDER — SODIUM CHLORIDE 0.9 % IV SOLN: INTRAVENOUS | Status: DC

## 2019-08-07 MED ORDER — MIRTAZAPINE 7.5 MG PO TABS
3.7500 mg | ORAL_TABLET | Freq: Every day | ORAL | Status: DC
  Administered 2019-08-07 – 2019-08-12 (×6): 3.75 mg via ORAL
  Filled 2019-08-07 (×7): qty 1

## 2019-08-07 MED ORDER — SERTRALINE HCL 25 MG PO TABS
25.0000 mg | ORAL_TABLET | Freq: Every day | ORAL | Status: DC
  Administered 2019-08-07 – 2019-08-12 (×6): 25 mg via ORAL
  Filled 2019-08-07 (×6): qty 1

## 2019-08-07 MED ORDER — IPRATROPIUM-ALBUTEROL 0.5-2.5 (3) MG/3ML IN SOLN
3.0000 mL | Freq: Three times a day (TID) | RESPIRATORY_TRACT | Status: DC
  Administered 2019-08-08 – 2019-08-13 (×17): 3 mL via RESPIRATORY_TRACT
  Filled 2019-08-07 (×17): qty 3

## 2019-08-07 MED ORDER — IPRATROPIUM-ALBUTEROL 0.5-2.5 (3) MG/3ML IN SOLN: 3.0000 mL | Freq: Once | RESPIRATORY_TRACT | Status: DC

## 2019-08-07 MED ORDER — SODIUM CHLORIDE 0.9 % IV SOLN
500.0000 mg | INTRAVENOUS | Status: DC
  Administered 2019-08-07: 500 mg via INTRAVENOUS
  Filled 2019-08-07 (×2): qty 500

## 2019-08-07 MED ORDER — METHYLPREDNISOLONE SODIUM SUCC 125 MG IJ SOLR
60.0000 mg | Freq: Four times a day (QID) | INTRAMUSCULAR | Status: DC
  Administered 2019-08-07 – 2019-08-12 (×20): 60 mg via INTRAVENOUS
  Filled 2019-08-07 (×20): qty 2

## 2019-08-07 MED ORDER — BUDESONIDE 0.25 MG/2ML IN SUSP
0.2500 mg | Freq: Two times a day (BID) | RESPIRATORY_TRACT | Status: DC
  Administered 2019-08-07 – 2019-08-13 (×12): 0.25 mg via RESPIRATORY_TRACT
  Filled 2019-08-07 (×12): qty 2

## 2019-08-07 MED ORDER — ALBUTEROL SULFATE (2.5 MG/3ML) 0.083% IN NEBU
2.5000 mg | INHALATION_SOLUTION | RESPIRATORY_TRACT | Status: DC | PRN
  Administered 2019-08-07 – 2019-08-08 (×2): 2.5 mg via RESPIRATORY_TRACT
  Filled 2019-08-07 (×2): qty 3

## 2019-08-07 MED ORDER — AMLODIPINE BESYLATE 5 MG PO TABS
5.0000 mg | ORAL_TABLET | Freq: Every day | ORAL | Status: DC
  Administered 2019-08-07 – 2019-08-13 (×7): 5 mg via ORAL
  Filled 2019-08-07 (×7): qty 1

## 2019-08-07 MED ORDER — METHYLPREDNISOLONE SODIUM SUCC 125 MG IJ SOLR
125.0000 mg | Freq: Once | INTRAMUSCULAR | Status: AC
  Administered 2019-08-07: 125 mg via INTRAVENOUS
  Filled 2019-08-07: qty 2

## 2019-08-07 MED ORDER — GABAPENTIN 100 MG PO CAPS
100.0000 mg | ORAL_CAPSULE | Freq: Every day | ORAL | Status: DC
  Administered 2019-08-07 – 2019-08-12 (×6): 100 mg via ORAL
  Filled 2019-08-07 (×6): qty 1

## 2019-08-07 MED ORDER — ALBUTEROL SULFATE HFA 108 (90 BASE) MCG/ACT IN AERS
2.0000 | INHALATION_SPRAY | RESPIRATORY_TRACT | Status: DC | PRN
  Filled 2019-08-07: qty 6.7

## 2019-08-07 NOTE — Progress Notes (Signed)
Spoke w/ patient's daughter, gave update and answered all questions. Daughter would like [employer] officer, [employer] to call, patient is having issues at her residence, daughter also asked if there could be an consult or eval for home oxygen.

## 2019-08-07 NOTE — Progress Notes (Signed)
PROGRESS NOTE  [name] [id number] DOB: 1946-08-11 DOA: 08/06/2019 PCP: Isaac Bliss, Rayford Halsted, MD   LOS: 1 day   Brief Narrative / Interim history: 73 year old female with history of COPD, left eye blindness, HTN, depression, sarcoidosis who came into the ER and was admitted on 08/06/2019 with significant shortness of breath, cough, wheezing, progressively getting worse over the past 3 days with no help with her home treatment.  In the ED underwent chest x-ray which showed bilateral infiltrates.  COVID-19 was negative.  Due to marked expiratory wheezing and pneumonia on the chest x-ray she was placed on antibiotics, and admitted to the hospital and also placed on IV steroids.  Subjective / 24h Interval events: -Feeling a lot better this morning, slept well overnight last night and actually had pretty good rest.  Breathing not back to baseline and she is still wheezing and feels short of breath  Assessment & Plan: Principal Problem Acute hypoxic respiratory failure due to COPD exacerbation /pneumonia -Continue antibiotics with ceftriaxone and azithromycin, continue IV steroids, now that she is Covid negative we will transition her to nebulizers instead of inhalers  Active Problems Hyponatremia -Likely due to dehydration with hypernatremia, responded well to fluids and sodium increased to 129, continue fluids be okay to discontinue tomorrow morning -Hold home hydrochlorothiazide  Hypokalemia -Potassium normalized after replacement, continue to monitor  Hypertension -Continue home amlodipine, metoprolol  Sarcoidosis -Stable, outpatient follow-up  Hyperlipidemia -continue statin  Depression -Continue home medications   Scheduled Meds: . amLODipine  5 mg Oral Daily  . budesonide (PULMICORT) nebulizer solution  0.25 mg Nebulization BID  . enoxaparin (LOVENOX) injection  40 mg Subcutaneous Daily  . gabapentin  100 mg Oral QHS  . mouth rinse  15 mL Mouth Rinse  BID  . methylPREDNISolone (SOLU-MEDROL) injection  60 mg Intravenous Q6H  . metoprolol succinate  50 mg Oral Daily  . mirtazapine  3.75 mg Oral QHS  . pravastatin  40 mg Oral Daily  . sertraline  25 mg Oral QHS   Continuous Infusions: . sodium chloride Stopped (08/07/19 0532)  . sodium chloride 100 mL/hr at 08/07/19 0250  . azithromycin    . cefTRIAXone (ROCEPHIN)  IV     PRN Meds:.albuterol  DVT prophylaxis: Lovenox Code Status: Full code Family Communication: d/w patient Disposition Plan: home when ready   Consultants:  None   Procedures:  None   Microbiology  SARS-CoV-2, flu A, B 12/26-negative  Antimicrobials: Ceftriaxone 12/26 Azithromycin 12/26   Objective: Vitals:   08/07/19 0511 08/07/19 0723 08/07/19 0858 08/07/19 1329  BP: 93/62 (!) 104/56 (!) 146/83 (!) 155/86  Pulse: (!) 103 95 (!) 104 (!) 110  Resp: (!) 24 16 16 20   Temp: 98.8 F (37.1 C) 97.8 F (36.6 C) 97.7 F (36.5 C) (!) 97.4 F (36.3 C)  TempSrc: Oral Oral Oral Oral  SpO2: 100% 100% 100% 100%  Weight: 51.4 kg     Height: 5\' 3"  (1.6 m)       Intake/Output Summary (Last 24 hours) at 08/07/2019 1504 Last data filed at 08/07/2019 1045 Gross per 24 hour  Intake 413.22 ml  Output 200 ml  Net 213.22 ml   Filed Weights   08/07/19 0511  Weight: 51.4 kg    Examination:  Constitutional: NAD Eyes: no scleral icterus ENMT: Mucous membranes are moist.  Neck: normal, supple Respiratory: End expiratory wheezing present, moves air well, slightly increased respiratory effort Cardiovascular: Regular rate and rhythm, no murmurs / rubs / gallops.  Abdomen: non distended, no tenderness. Bowel sounds positive.  Musculoskeletal: no clubbing / cyanosis.  Skin: no rashes Neurologic: CN 2-12 grossly intact. Strength 5/5 in all 4.  Psychiatric: Normal judgment and insight. Alert and oriented x 3. Normal mood.    Data Reviewed: I have independently reviewed following labs and imaging studies    CBC: Recent Labs  Lab 08-18-2019 1935 08/07/19 0605  WBC 7.2 3.7*  HGB 11.5* 9.8*  HCT 33.9* 29.8*  MCV 84.1 86.1  PLT 309 0000000   Basic Metabolic Panel: Recent Labs  Lab 18-Aug-2019 1935 18-Aug-2019 2322 08/07/19 0605  NA 121*  --  129*  K 2.8*  --  3.7  CL 82*  --  94*  CO2 25  --  24  GLUCOSE 89  --  112*  BUN 12  --  11  CREATININE 0.81 0.76 0.63  CALCIUM 8.2*  --  8.0*   Liver Function Tests: Recent Labs  Lab 08/07/19 0605  AST 21  ALT 14  ALKPHOS 96  BILITOT 0.6  PROT 5.8*  ALBUMIN 2.3*   Coagulation Profile: No results for input(s): INR, PROTIME in the last 168 hours. HbA1C: No results for input(s): HGBA1C in the last 72 hours. CBG: No results for input(s): GLUCAP in the last 168 hours.  Recent Results (from the past 240 hour(s))  Respiratory Panel by RT PCR (Flu A&B, Covid) -     Status: None   Collection Time: August 18, 2019  8:23 PM  Result Value Ref Range Status   SARS Coronavirus 2 by RT PCR NEGATIVE NEGATIVE Final   Influenza A by PCR NEGATIVE NEGATIVE Final   Influenza B by PCR NEGATIVE NEGATIVE Final    Comment: Performed at [facility], [place] [address] [place], [place] [address]     Radiology Studies: DG Chest 2 View  Result Date: August 18, 2019 CLINICAL DATA:  Shortness of breath and chest tightness. History of chronic lung disease. EXAM: CHEST - 2 VIEW COMPARISON:  06/03/2019 as well as multiple prior x-ray and CT studies. FINDINGS: The heart size is stable. Superimposed on severe fibrotic lung disease are potentially some new infiltrates especially in the mid to lower lung zones bilaterally. No pneumothorax or significant pleural fluid. IMPRESSION: Superimposed on severe fibrotic lung disease are potentially new infiltrates in the mid to lower lung zones bilaterally. Electronically Signed   By: Aletta Edouard M.D.   On: Aug 18, 2019 15:54   Marzetta Board, MD, PhD [facility]  Between 7 am - 7 pm I am available, please  contact me via Amion or Securechat  Between 7 pm - 7 am I am not available, please contact night coverage MD/APP via Amion

## 2019-08-07 NOTE — Progress Notes (Signed)
   08/07/19 0511  MEWS Score  Resp (!) 24  Pulse Rate (!) 103  BP 93/62  Temp 98.8 F (37.1 C)  Level of Consciousness Alert  SpO2 100 %  MEWS Score  MEWS RR 1  MEWS Pulse 1  MEWS Systolic 1  MEWS LOC 0  MEWS Temp 0  MEWS Score 3  MEWS Score Color Yellow  MEWS Assessment  Is this an acute change? Yes  MEWS guidelines implemented *See Row Information* Yellow  Rapid Response Notification  Name of Rapid Response RN Notified NA  Provider Notification  Provider Name/Title Bodenheimer  Date Provider Notified 08/07/19  Time Provider Notified [phone]  Notification Type Page  Notification Reason Change in status  BP trending down, RR and HR have been slightly elevated through out ED stay and upon transfer to 1517. Will monitor more closely following yellow MEWS guidelines.

## 2019-08-08 LAB — CBC
HCT: 27.4 % — ABNORMAL LOW (ref 36.0–46.0)
Hemoglobin: 8.8 g/dL — ABNORMAL LOW (ref 12.0–15.0)
MCH: 27.9 pg (ref 26.0–34.0)
MCHC: 32.1 g/dL (ref 30.0–36.0)
MCV: 87 fL (ref 80.0–100.0)
Platelets: 271 10*3/uL (ref 150–400)
RBC: 3.15 MIL/uL — ABNORMAL LOW (ref 3.87–5.11)
RDW: 18.3 % — ABNORMAL HIGH (ref 11.5–15.5)
WBC: 3.7 10*3/uL — ABNORMAL LOW (ref 4.0–10.5)
nRBC: 0 % (ref 0.0–0.2)

## 2019-08-08 LAB — COMPREHENSIVE METABOLIC PANEL
ALT: 13 U/L (ref 0–44)
AST: 22 U/L (ref 15–41)
Albumin: 2.1 g/dL — ABNORMAL LOW (ref 3.5–5.0)
Alkaline Phosphatase: 83 U/L (ref 38–126)
Anion gap: 11 (ref 5–15)
BUN: 9 mg/dL (ref 8–23)
CO2: 23 mmol/L (ref 22–32)
Calcium: 7.6 mg/dL — ABNORMAL LOW (ref 8.9–10.3)
Chloride: 102 mmol/L (ref 98–111)
Creatinine, Ser: 0.55 mg/dL (ref 0.44–1.00)
GFR calc Af Amer: >60 mL/min (ref >60)
GFR calc non Af Amer: >60 mL/min (ref >60)
Glucose, Bld: 136 mg/dL — ABNORMAL HIGH (ref 70–99)
Potassium: 2.9 mmol/L — ABNORMAL LOW (ref 3.5–5.1)
Sodium: 136 mmol/L (ref 135–145)
Total Bilirubin: 0.5 mg/dL (ref 0.3–1.2)
Total Protein: 5.4 g/dL — ABNORMAL LOW (ref 6.5–8.1)

## 2019-08-08 MED ORDER — SODIUM CHLORIDE 0.9 % IV SOLN
1000.0000 mL | INTRAVENOUS | Status: DC
  Administered 2019-08-08 (×2): 1000 mL via INTRAVENOUS

## 2019-08-08 MED ORDER — POTASSIUM CHLORIDE 20 MEQ/15ML (10%) PO SOLN
40.0000 meq | ORAL | Status: AC
  Administered 2019-08-08 (×2): 40 meq via ORAL
  Filled 2019-08-08 (×2): qty 30

## 2019-08-08 MED ORDER — AZITHROMYCIN 250 MG PO TABS
500.0000 mg | ORAL_TABLET | Freq: Every day | ORAL | Status: AC
  Administered 2019-08-08 – 2019-08-10 (×3): 500 mg via ORAL
  Filled 2019-08-08 (×3): qty 2

## 2019-08-08 NOTE — Progress Notes (Signed)
PROGRESS NOTE  [name] [id number] DOB: 09-08-1945 DOA: 08/06/2019 PCP: Isaac Bliss, Rayford Halsted, MD   LOS: 2 days   Brief Narrative / Interim history: 73 year old female with history of COPD, left eye blindness, HTN, depression, sarcoidosis who came into the ER and was admitted on 08/06/2019 with significant shortness of breath, cough, wheezing, progressively getting worse over the past 3 days with no help with her home treatment.  In the ED underwent chest x-ray which showed bilateral infiltrates.  COVID-19 was negative.  Due to marked expiratory wheezing and pneumonia on the chest x-ray she was placed on antibiotics, and admitted to the hospital and also placed on IV steroids.  Subjective / 24h Interval events: -Says her breathing is improved.  Had an episode last night where she became very short of breath  Assessment & Plan: Principal Problem Acute hypoxic respiratory failure due to COPD exacerbation /pneumonia -Continue antibiotics with ceftriaxone and azithromycin, continue IV steroids, continue nebulizer -Had an episode of shortness of breath last night, patient tells me it was due to coughing.  Resolved this morning  Active Problems Sarcoidosis/pulmonary fibrosis -Follows with pulmonology as an outpatient, suspect will need oxygen at home  Hyponatremia -Likely due to dehydration with hyponatremia, sodium has now normalized.  Decrease the rate of fluids and discontinue altogether,  Hypokalemia -Requiring repletion today, continue to monitor  Hypertension -Continue home amlodipine, metoprolol, blood pressure stable  Hyperlipidemia -continue statin  Depression -Continue home medications   Scheduled Meds: . amLODipine  5 mg Oral Daily  . budesonide (PULMICORT) nebulizer solution  0.25 mg Nebulization BID  . enoxaparin (LOVENOX) injection  40 mg Subcutaneous Daily  . gabapentin  100 mg Oral QHS  . ipratropium-albuterol  3 mL Nebulization Once  .  ipratropium-albuterol  3 mL Nebulization TID  . mouth rinse  15 mL Mouth Rinse BID  . methylPREDNISolone (SOLU-MEDROL) injection  60 mg Intravenous Q6H  . metoprolol succinate  50 mg Oral Daily  . mirtazapine  3.75 mg Oral QHS  . pravastatin  40 mg Oral Daily  . sertraline  25 mg Oral QHS   Continuous Infusions: . sodium chloride Stopped (08/07/19 0532)  . sodium chloride 100 mL/hr at 08/08/19 0200  . azithromycin Stopped (08/08/19 0004)  . cefTRIAXone (ROCEPHIN)  IV Stopped (08/07/19 2120)   PRN Meds:.albuterol, HYDROcodone-acetaminophen  DVT prophylaxis: Lovenox Code Status: Full code Family Communication: d/w patient Disposition Plan: home when ready   Consultants:  None   Procedures:  None   Microbiology  SARS-CoV-2, flu A, B 12/26-negative  Antimicrobials: Ceftriaxone 12/26 Azithromycin 12/26   Objective: Vitals:   08/08/19 0039 08/08/19 0457 08/08/19 1041 08/08/19 1048  BP: 114/67 115/68    Pulse: 96 92    Resp: 20 20    Temp: 98.3 F (36.8 C) 98.3 F (36.8 C)    TempSrc:      SpO2: 100% 99% (!) 84% 100%  Weight:      Height:        Intake/Output Summary (Last 24 hours) at 08/08/2019 1335 Last data filed at 08/08/2019 1300 Gross per 24 hour  Intake 3240.71 ml  Output 500 ml  Net 2740.71 ml   Filed Weights   08/07/19 0511  Weight: 51.4 kg    Examination:  Constitutional: No distress Eyes: No scleral icterus  ENMT: mmm Neck: normal, supple Respiratory: Faint end expiratory wheezing, improved, no crackles, moves air well Cardiovascular: Regular rhythm, no peripheral edema Abdomen: Soft, nondistended, nontender, bowel sounds positive Musculoskeletal: no  clubbing / cyanosis.  Skin: No rashes appreciated Neurologic: No focal deficits, equal strength Psychiatric: Normal judgment and insight. Alert and oriented x 3. Normal mood.    Data Reviewed: I have independently reviewed following labs and imaging studies   CBC: Recent Labs  Lab  2019-08-12 1935 08/07/19 0605 08/08/19 0524  WBC 7.2 3.7* 3.7*  HGB 11.5* 9.8* 8.8*  HCT 33.9* 29.8* 27.4*  MCV 84.1 86.1 87.0  PLT 309 258 [id number]   Basic Metabolic Panel: Recent Labs  Lab 08/12/2019 1935 August 12, 2019 2322 08/07/19 0605 08/08/19 0524  NA 121*  --  129* 136  K 2.8*  --  3.7 2.9*  CL 82*  --  94* 102  CO2 25  --  24 23  GLUCOSE 89  --  112* 136*  BUN 12  --  11 9  CREATININE 0.81 0.76 0.63 0.55  CALCIUM 8.2*  --  8.0* 7.6*   Liver Function Tests: Recent Labs  Lab 08/07/19 0605 08/08/19 0524  AST 21 22  ALT 14 13  ALKPHOS 96 83  BILITOT 0.6 0.5  PROT 5.8* 5.4*  ALBUMIN 2.3* 2.1*   Coagulation Profile: No results for input(s): INR, PROTIME in the last 168 hours. HbA1C: No results for input(s): HGBA1C in the last 72 hours. CBG: No results for input(s): GLUCAP in the last 168 hours.  Recent Results (from the past 240 hour(s))  Respiratory Panel by RT PCR (Flu A&B, Covid) -     Status: None   Collection Time: 12-Aug-2019  8:23 PM  Result Value Ref Range Status   SARS Coronavirus 2 by RT PCR NEGATIVE NEGATIVE Final   Influenza A by PCR NEGATIVE NEGATIVE Final   Influenza B by PCR NEGATIVE NEGATIVE Final    Comment: Performed at [facility], [place] [address] [place], [place] [address]     Radiology Studies: DG CHEST PORT 1 VIEW  Result Date: 08/08/2019 CLINICAL DATA:  Tachypnea EXAM: PORTABLE CHEST 1 VIEW COMPARISON:  Radiograph 08-12-19 FINDINGS: Severe fibrotic lung disease is again noted with some diminishing lung volumes and increasing patchy opacities within the infrahilar lungs. Suspect trace effusions versus scarring in costophrenic sulci. Cardiomediastinal silhouette is partially obscured by opacity but is grossly similar accounting for differences technique. No acute osseous or soft tissue abnormality. IMPRESSION: 1. Severe fibrotic lung disease with some diminishing lung volumes and increasing patchy opacities within the infrahilar  lungs which could reflect an acute infectious process or edema in the appropriate clinical setting. 2. Trace pleural effusions versus scarring in the costophrenic sulci. Electronically Signed   By: Lovena Le M.D.   On: 08/08/2019 00:13   Marzetta Board, MD, PhD [facility]  Between 7 am - 7 pm I am available, please contact me via Amion or Securechat  Between 7 pm - 7 am I am not available, please contact night coverage MD/APP via Amion

## 2019-08-08 NOTE — Plan of Care (Signed)
  Problem: Education: Goal: Knowledge of General Education information will improve Description: Including pain rating scale, medication(s)/side effects and non-pharmacologic comfort measures Outcome: Progressing   Problem: Clinical Measurements: Goal: Respiratory complications will improve Outcome: Progressing Goal: Cardiovascular complication will be avoided Outcome: Progressing   Problem: Coping: Goal: Level of anxiety will decrease Outcome: Progressing   Problem: Elimination: Goal: Will not experience complications related to urinary retention Outcome: Progressing   Problem: Pain Managment: Goal: General experience of comfort will improve Outcome: Progressing   Problem: Safety: Goal: Ability to remain free from injury will improve Outcome: Progressing   

## 2019-08-08 NOTE — Progress Notes (Addendum)
   08/07/19 2309  MEWS Score  Resp (!) 21  SpO2 100 %  O2 Device Nasal Cannula  O2 Flow Rate (L/min) 2 L/min  MEWS Score  MEWS RR 1  MEWS Pulse 1  MEWS Systolic 0  MEWS LOC 0  MEWS Temp 0  MEWS Score 2  MEWS Score Color Yellow  Provider Notification  Provider Name/Title Bodenheimer, NP  Date Provider Notified 08/07/19  Time Provider Notified 2310  Notification Type Page  Notification Reason Change in status  Response See new orders  Date of Provider Response 08/07/19  Time of Provider Response [id number]   On call physician made aware of the above information. Physician also made aware that the patient is continuing to have tachypnea and inspiratory/expiratory wheezing bilaterally, and patient now has slight nasal flaring. RN notified RT and a nebulizer treatment was completed.   Orders Received: 125mg  Solu-Medrol, Nebulizer treatment, Chest x-ray. RN to continue to monitor.

## 2019-08-08 NOTE — Progress Notes (Addendum)
   08/07/19 2043  MEWS Score  Resp (!) 21  Pulse Rate (!) 102  BP 123/68  Temp 98.3 F (36.8 C)  SpO2 100 %  O2 Device Nasal Cannula  O2 Flow Rate (L/min) 2 L/min  MEWS Score  MEWS RR 1  MEWS Pulse 1  MEWS Systolic 0  MEWS LOC 0  MEWS Temp 0  MEWS Score 2  MEWS Score Color Yellow  Provider Notification  Provider Name/Title Bodenheimer, NP  Date Provider Notified 08/07/19  Time Provider Notified 2105  Notification Type Page  Notification Reason Change in status  Response No new orders (RN will continue to monitor)  Date of Provider Response 08/07/19  Time of Provider Response 2111   RN notified on call physician of the above information. On call physician also made aware of inspiratory and expiratory wheezing. Nebulizer treatment just administered, 60mg  Solumedrol given per order.   No new orders, RN will continue to monitor.

## 2019-08-09 LAB — BASIC METABOLIC PANEL
Anion gap: 6 (ref 5–15)
BUN: 9 mg/dL (ref 8–23)
CO2: 24 mmol/L (ref 22–32)
Calcium: 7.8 mg/dL — ABNORMAL LOW (ref 8.9–10.3)
Chloride: 104 mmol/L (ref 98–111)
Creatinine, Ser: 0.49 mg/dL (ref 0.44–1.00)
GFR calc Af Amer: >60 mL/min (ref >60)
GFR calc non Af Amer: >60 mL/min (ref >60)
Glucose, Bld: 128 mg/dL — ABNORMAL HIGH (ref 70–99)
Potassium: 3.6 mmol/L (ref 3.5–5.1)
Sodium: 134 mmol/L — ABNORMAL LOW (ref 135–145)

## 2019-08-09 LAB — CBC
HCT: 28.3 % — ABNORMAL LOW (ref 36.0–46.0)
Hemoglobin: 8.8 g/dL — ABNORMAL LOW (ref 12.0–15.0)
MCH: 27.8 pg (ref 26.0–34.0)
MCHC: 31.1 g/dL (ref 30.0–36.0)
MCV: 89.6 fL (ref 80.0–100.0)
Platelets: 299 10*3/uL (ref 150–400)
RBC: 3.16 MIL/uL — ABNORMAL LOW (ref 3.87–5.11)
RDW: 18.6 % — ABNORMAL HIGH (ref 11.5–15.5)
WBC: 7.4 10*3/uL (ref 4.0–10.5)
nRBC: 0 % (ref 0.0–0.2)

## 2019-08-09 MED ORDER — ENSURE ENLIVE PO LIQD
237.0000 mL | Freq: Two times a day (BID) | ORAL | Status: DC
  Administered 2019-08-09 – 2019-08-10 (×3): 237 mL via ORAL

## 2019-08-09 MED ORDER — GUAIFENESIN-CODEINE 100-10 MG/5ML PO SOLN
5.0000 mL | ORAL | Status: DC | PRN
  Administered 2019-08-11: 23:00:00 5 mL via ORAL
  Filled 2019-08-09: qty 5

## 2019-08-09 MED ORDER — JUVEN PO PACK
1.0000 | PACK | Freq: Two times a day (BID) | ORAL | Status: DC
  Administered 2019-08-09 – 2019-08-13 (×5): 1 via ORAL
  Filled 2019-08-09 (×10): qty 1

## 2019-08-09 MED ORDER — ADULT MULTIVITAMIN W/MINERALS CH
1.0000 | ORAL_TABLET | Freq: Every day | ORAL | Status: DC
  Administered 2019-08-09 – 2019-08-13 (×5): 1 via ORAL
  Filled 2019-08-09 (×5): qty 1

## 2019-08-09 NOTE — Care Management Important Message (Signed)
Important Message  Patient Details IM Letter given to Kathrin Greathouse SW Case Manager to present to the Patient Name: [name] MRN: [id number] Date of Birth: May 08, 1946   Medicare Important Message Given:  Yes     Kerin Salen 08/09/2019, 11:03 AM

## 2019-08-09 NOTE — Progress Notes (Signed)
Initial Nutrition Assessment  INTERVENTION:   -Ensure Enlive po BID, each supplement provides 350 kcal and 20 grams of protein -Juven Fruit Punch BID, each serving provides 95kcal and 2.5g of protein (amino acids glutamine and arginine) -Multivitamin with minerals daily  NUTRITION DIAGNOSIS:   Increased nutrient needs related to wound healing, chronic illness, acute illness(COPD, PNA) as evidenced by estimated needs.  GOAL:   Patient will meet greater than or equal to 90% of their needs  MONITOR:   PO intake, Supplement acceptance, Labs, Weight trends, I & O's, Skin  REASON FOR ASSESSMENT:   Malnutrition Screening Tool    ASSESSMENT:   73 y.o. female with medical history significant of COPD, left eye blindness, hypertension, depression, osteoporosis and sarcoidosis who presented to the ER with significant shortness of breath cough and wheezing.  Symptoms have been progressive over the last 3 days. Patient noted to have pneumonia on chest x-ray so she is being admitted with acute on chronic respiratory failure secondary to COPD exacerbation and pneumonia.  Patient consuming 25% of meals at this time. Appetite improving slightly as breathing is improving. Given sacral pressure injury and COPD, pt with increased needs. RD will order Ensure and Juven supplements for additional kcals and proteins to aid in  wound healing.    Per weight records, pt has lost 17 lbs since 10/27 (13% wt loss x 2 months, significant for time frame).   I/Os: +3.6L since admit   Medications: Remeron tablet  Labs reviewed: Low Na   NUTRITION - FOCUSED PHYSICAL EXAM:  Working remotely.  Diet Order:   Diet Order            Diet regular Room service appropriate? Yes; Fluid consistency: Thin  Diet effective now              EDUCATION NEEDS:   No education needs have been identified at this time  Skin:  Skin Assessment: Skin Integrity Issues: Skin Integrity Issues:: Stage II Stage II:  sacrum  Last BM:  12/28 -type 6  Height:   Ht Readings from Last 1 Encounters:  08/07/19 5\' 3"  (1.6 m)    Weight:   Wt Readings from Last 1 Encounters:  08/07/19 51.4 kg    Ideal Body Weight:  52.3 kg  BMI:  Body mass index is 20.07 kg/m.  Estimated Nutritional Needs:   Kcal:  1550-1750  Protein:  75-85g  Fluid:  1.7L/day  Clayton Bibles, MS, RD, LDN Inpatient Clinical Dietitian Pager: [phone] After Hours Pager: [phone]

## 2019-08-09 NOTE — Progress Notes (Signed)
PROGRESS NOTE  [name] [id number] DOB: 04/17/1946 DOA: 08/06/2019 PCP: Isaac Bliss, Rayford Halsted, MD   LOS: 3 days   Brief Narrative / Interim history: 73 year old female with history of COPD, left eye blindness, HTN, depression, sarcoidosis who came into the ER and was admitted on 08/06/2019 with significant shortness of breath, cough, wheezing, progressively getting worse over the past 3 days with no help with her home treatment.  In the ED underwent chest x-ray which showed bilateral infiltrates.  COVID-19 was negative.  Due to marked expiratory wheezing and pneumonia on the chest x-ray she was placed on antibiotics, and admitted to the hospital and also placed on IV steroids.  Subjective / 24h Interval events: -Remains with significant coughing spells overnight, in between she feels relatively well.  She appreciates that her wheezing has gotten worse today  Assessment & Plan: Principal Problem Acute hypoxic respiratory failure due to COPD exacerbation /pneumonia -Continue antibiotics with ceftriaxone and azithromycin -Remains with significant wheezing very slow to improve.  Coughing when I was in the room and audible wheezing without stethoscope -Incentive spirometry, sit up in chair, continue steroids, antibiotics, nebulizers, antitussives  Active Problems Sarcoidosis/pulmonary fibrosis -Follows with pulmonology as an outpatient, suspect will need oxygen at home  Hyponatremia -Likely due to dehydration with hyponatremia, sodium has now normalized. -Discontinue fluids  Hypokalemia -Stable today, monitor  Hypertension -Continue home amlodipine, metoprolol, blood pressure stable  Hyperlipidemia -continue statin  Depression -Continue home medications   Scheduled Meds: . amLODipine  5 mg Oral Daily  . azithromycin  500 mg Oral Daily  . budesonide (PULMICORT) nebulizer solution  0.25 mg Nebulization BID  . enoxaparin (LOVENOX) injection  40 mg Subcutaneous  Daily  . feeding supplement (ENSURE ENLIVE)  237 mL Oral BID BM  . gabapentin  100 mg Oral QHS  . ipratropium-albuterol  3 mL Nebulization Once  . ipratropium-albuterol  3 mL Nebulization TID  . mouth rinse  15 mL Mouth Rinse BID  . methylPREDNISolone (SOLU-MEDROL) injection  60 mg Intravenous Q6H  . metoprolol succinate  50 mg Oral Daily  . mirtazapine  3.75 mg Oral QHS  . multivitamin with minerals  1 tablet Oral Daily  . nutrition supplement (JUVEN)  1 packet Oral BID BM  . pravastatin  40 mg Oral Daily  . sertraline  25 mg Oral QHS   Continuous Infusions: . sodium chloride 1,000 mL (08/08/19 1940)  . cefTRIAXone (ROCEPHIN)  IV Stopped (08/08/19 2049)   PRN Meds:.albuterol, guaiFENesin-codeine, HYDROcodone-acetaminophen  DVT prophylaxis: Lovenox Code Status: Full code Family Communication: d/w patient Disposition Plan: home when ready   Consultants:  None   Procedures:  None   Microbiology  SARS-CoV-2, flu A, B 12/26-negative  Antimicrobials: Ceftriaxone 12/26 Azithromycin 12/26   Objective: Vitals:   08/09/19 0526 08/09/19 1012 08/09/19 1018 08/09/19 1329  BP: 133/86   (!) 155/98  Pulse: 98   (!) 109  Resp: 16   20  Temp: 98 F (36.7 C)   98 F (36.7 C)  TempSrc: Oral   Oral  SpO2: 95% 99% 99% 100%  Weight:      Height:        Intake/Output Summary (Last 24 hours) at 08/09/2019 1329 Last data filed at 08/09/2019 0500 Gross per 24 hour  Intake 705.58 ml  Output --  Net 705.58 ml   Filed Weights   08/07/19 0511  Weight: 51.4 kg    Examination:  Constitutional: No distress but has audible wheezing Eyes: No scleral icterus  ENMT: Moist mucous membranes Neck: normal, supple Respiratory: Bilateral wheezing throughout lung fields, moves air well Cardiovascular: Regular rate and rhythm, no edema Abdomen: Soft, NT, ND, bowel sounds positive Musculoskeletal: no clubbing / cyanosis.  Skin: No rashes Neurologic: No focal deficits Psychiatric:  Normal judgment and insight. Alert and oriented x 3. Normal mood.    Data Reviewed: I have independently reviewed following labs and imaging studies   CBC: Recent Labs  Lab 08/06/19 1935 08/07/19 0605 08/08/19 0524 08/09/19 0606  WBC 7.2 3.7* 3.7* 7.4  HGB 11.5* 9.8* 8.8* 8.8*  HCT 33.9* 29.8* 27.4* 28.3*  MCV 84.1 86.1 87.0 89.6  PLT 309 258 271 [id number]   Basic Metabolic Panel: Recent Labs  Lab 08/06/19 1935 08/06/19 2322 08/07/19 0605 08/08/19 0524 08/09/19 0606  NA 121*  --  129* 136 134*  K 2.8*  --  3.7 2.9* 3.6  CL 82*  --  94* 102 104  CO2 25  --  24 23 24   GLUCOSE 89  --  112* 136* 128*  BUN 12  --  11 9 9   CREATININE 0.81 0.76 0.63 0.55 0.49  CALCIUM 8.2*  --  8.0* 7.6* 7.8*   Liver Function Tests: Recent Labs  Lab 08/07/19 0605 08/08/19 0524  AST 21 22  ALT 14 13  ALKPHOS 96 83  BILITOT 0.6 0.5  PROT 5.8* 5.4*  ALBUMIN 2.3* 2.1*   Coagulation Profile: No results for input(s): INR, PROTIME in the last 168 hours. HbA1C: No results for input(s): HGBA1C in the last 72 hours. CBG: No results for input(s): GLUCAP in the last 168 hours.  Recent Results (from the past 240 hour(s))  Respiratory Panel by RT PCR (Flu A&B, Covid) -     Status: None   Collection Time: 08/06/19  8:23 PM  Result Value Ref Range Status   SARS Coronavirus 2 by RT PCR NEGATIVE NEGATIVE Final   Influenza A by PCR NEGATIVE NEGATIVE Final   Influenza B by PCR NEGATIVE NEGATIVE Final    Comment: Performed at [facility], [place] [address], [place], [place] [address]  Culture, blood (routine x 2) Call MD if unable to obtain prior to antibiotics being given     Status: None (Preliminary result)   Collection Time: 08/06/19 11:22 PM   Specimen: BLOOD  Result Value Ref Range Status   Specimen Description   Final    BLOOD RIGHT ANTECUBITAL Performed at [place] [address], [place], [place] [address]    Special Requests   Final    BOTTLES  DRAWN AEROBIC AND ANAEROBIC Blood Culture results may not be optimal due to an excessive volume of blood received in culture bottles Performed at [place] [address], [place], [place] [address]    Culture   Final    NO GROWTH 2 DAYS Performed at [place] [address] [place], [place] [address]    Report Status PENDING  Incomplete  Culture, blood (routine x 2) Call MD if unable to obtain prior to antibiotics being given     Status: None (Preliminary result)   Collection Time: 08/06/19 11:26 PM   Specimen: BLOOD  Result Value Ref Range Status   Specimen Description   Final    BLOOD LEFT ANTECUBITAL Performed at [facility] [address] [place], [place] [address]    Special Requests   Final    BOTTLES DRAWN AEROBIC AND ANAEROBIC Blood Culture adequate  volume Performed at [facility], [place] [address] [place], [place] [address]    Culture   Final    NO GROWTH 2 DAYS Performed at [place] [address] [place], [place] [address]    Report Status PENDING  Incomplete     Radiology Studies: No results found. Marzetta Board, MD, PhD [facility]  Between 7 am - 7 pm I am available, please contact me via Amion or Securechat  Between 7 pm - 7 am I am not available, please contact night coverage MD/APP via Amion

## 2019-08-09 NOTE — Evaluation (Signed)
Physical Therapy Evaluation Patient Details Name: [name] MRN: [id number] DOB: [birth year] Today's Date: 08/09/2019   History of Present Illness  73 yo female admitted with acute respiratory failure. Hx of sarcoidosis, COPD, L eye blindness, pulm HTN, recurrent R hip dislocation  Clinical Impression  On eval, pt required Min assist for mobility. She walked ~10 feet in the room with use of a RW. Noted pt to be dyspneic at rest and with activity. O2 100% on 2L [place] at rest. Reading fluctuated between 88-96% on 2L with activity. Pt fatigues fairly quickly with activity. Family member present during session. Will continue to follow and progress activity as tolerated.     Follow Up Recommendations Home health PT;Supervision/Assistance - 24 hour    Equipment Recommendations  Rolling walker with 5" wheels;3in1 (PT)    Recommendations for Other Services       Precautions / Restrictions Precautions Precautions: Fall Precaution Comments: monitor O2. Restrictions Weight Bearing Restrictions: No      Mobility  Bed Mobility Overal bed mobility: Needs Assistance Bed Mobility: Supine to Sit     Supine to sit: HOB elevated;Supervision     General bed mobility comments: for safety. increased time.  Transfers Overall transfer level: Needs assistance Equipment used: Rolling walker (2 wheeled) Transfers: Sit to/from Stand Sit to Stand: Min assist         General transfer comment: Assist to rise, stabilize, control descent. VCs safety, hand placement.  Ambulation/Gait Ambulation/Gait assistance: Min assist Gait Distance (Feet): 10 Feet Assistive device: Rolling walker (2 wheeled) Gait Pattern/deviations: Step-through pattern;Decreased stride length     General Gait Details: Assist to stabilize intermittently. Dyspnea 3/4. Pt remained on 2L [place] O2 for ambulation. She fatigues fairly easily.  Stairs            Wheelchair Mobility    Modified Rankin (Stroke Patients  Only)       Balance Overall balance assessment: Needs assistance         Standing balance support: Bilateral upper extremity supported Standing balance-Leahy Scale: Poor                               Pertinent Vitals/Pain Pain Assessment: Faces Faces Pain Scale: No hurt    Home Living Family/patient expects to be discharged to:: Private residence Living Arrangements: Children Available Help at Discharge: Family Type of Home: House Home Access: Stairs to enter   Technical brewer of Steps: 2 Home Layout: One level Home Equipment: Cane - quad      Prior Function Level of Independence: Independent with assistive device(s)         Comments: uses quad cane for ambulation. no assist needed for ADLs     Hand Dominance        Extremity/Trunk Assessment   Upper Extremity Assessment Upper Extremity Assessment: Generalized weakness    Lower Extremity Assessment Lower Extremity Assessment: Generalized weakness    Cervical / Trunk Assessment Cervical / Trunk Assessment: Kyphotic  Communication   Communication: No difficulties  Cognition Arousal/Alertness: Awake/alert Behavior During Therapy: WFL for tasks assessed/performed Overall Cognitive Status: Within Functional Limits for tasks assessed                                        General Comments      Exercises     Assessment/Plan  PT Assessment Patient needs continued PT services  PT Problem List Decreased strength;Decreased mobility;Decreased activity tolerance;Decreased balance;Decreased knowledge of use of DME       PT Treatment Interventions DME instruction;Gait training;Therapeutic activities;Therapeutic exercise;Balance training;Functional mobility training;Patient/family education    PT Goals (Current goals can be found in the Care Plan section)  Acute Rehab PT Goals Patient Stated Goal: home. to breathe better. PT Goal Formulation: With patient Time For  Goal Achievement: 08/23/19 Potential to Achieve Goals: Fair    Frequency Min 3X/week   Barriers to discharge        Co-evaluation               AM-PAC PT "6 Clicks" Mobility  Outcome Measure Help needed turning from your back to your side while in a flat bed without using bedrails?: A Little Help needed moving from lying on your back to sitting on the side of a flat bed without using bedrails?: A Little Help needed moving to and from a bed to a chair (including a wheelchair)?: A Little Help needed standing up from a chair using your arms (e.g., wheelchair or bedside chair)?: A Little Help needed to walk in hospital room?: A Little Help needed climbing 3-5 steps with a railing? : A Lot 6 Click Score: 17    End of Session Equipment Utilized During Treatment: Oxygen Activity Tolerance: Patient limited by fatigue Patient left: in bed;with call bell/phone within reach;with bed alarm set   PT Visit Diagnosis: Muscle weakness (generalized) (M62.81);Difficulty in walking, not elsewhere classified (R26.2);Unsteadiness on feet (R26.81)    Time: [id number] PT Time Calculation (min) (ACUTE ONLY): 19 min   Charges:   PT Evaluation $PT Eval Moderate Complexity: 1 Mod            [name], PT Acute Rehabilitation

## 2019-08-10 NOTE — Progress Notes (Signed)
Spoke with pt's daughter, Kerby Less. Daughter would like a call from physician and/or SW whenever possible at [phone]

## 2019-08-10 NOTE — Evaluation (Signed)
Occupational Therapy Evaluation Patient Details Name: [name] MRN: [id number] DOB: 1945/11/16 Today's Date: 08/10/2019    History of Present Illness 73 yo female admitted with acute respiratory failure. Hx of sarcoidosis, COPD, L eye blindness, pulm HTN, recurrent R hip dislocation   Clinical Impression   Pt with decline in function and safety with ADLs and ADL mobility with impaired strength, balance and endurance. Pt lives at home with multiple family members, uses a cane for mobility and was independent with ADLs/selfcare and cooking. Pt currently requires min A with LB ADLs, min guard A with toileting, min A with mobility using RW and fatigues easily. Pt would benefit from acute OT services to address impairments to maximize level of function and safety    Follow Up Recommendations  No OT follow up;Supervision - Intermittent    Equipment Recommendations  3 in 1 bedside commode;Tub/shower bench;Other (comment)(reacher)    Recommendations for Other Services       Precautions / Restrictions Precautions Precautions: Fall Precaution Comments: monitor O2. Restrictions Weight Bearing Restrictions: No      Mobility Bed Mobility Overal bed mobility: Needs Assistance Bed Mobility: Supine to Sit;Sit to Supine     Supine to sit: HOB elevated;Supervision Sit to supine: Supervision   General bed mobility comments: for safety. increased time.  Transfers Overall transfer level: Needs assistance Equipment used: Rolling walker (2 wheeled) Transfers: Sit to/from Stand Sit to Stand: Min assist         General transfer comment: Assist to rise, stabilize, control descent. VCs safety, hand placement.    Balance Overall balance assessment: Needs assistance   Sitting balance-Leahy Scale: Good     Standing balance support: Bilateral upper extremity supported;During functional activity Standing balance-Leahy Scale: Poor                             ADL  either performed or assessed with clinical judgement   ADL Overall ADL's : Needs assistance/impaired Eating/Feeding: Independent;Sitting   Grooming: Wash/dry hands;Wash/dry face;Min guard;Standing   Upper Body Bathing: Set up;Supervision/ safety;Sitting   Lower Body Bathing: Minimal assistance   Upper Body Dressing : Set up;Supervision/safety;Sitting   Lower Body Dressing: Minimal assistance   Toilet Transfer: Minimal assistance;Ambulation;RW;Regular Toilet;Grab bars;Cueing for safety   Toileting- Clothing Manipulation and Hygiene: Min guard;Sit to/from stand       Functional mobility during ADLs: Minimal assistance;Cueing for safety;Rolling walker       Vision Patient Visual Report: No change from baseline       Perception     Praxis      Pertinent Vitals/Pain Pain Assessment: No/denies pain Pain Score: 0-No pain Pain Intervention(s): Monitored during session     Hand Dominance Right   Extremity/Trunk Assessment Upper Extremity Assessment Upper Extremity Assessment: Generalized weakness   Lower Extremity Assessment Lower Extremity Assessment: Defer to PT evaluation   Cervical / Trunk Assessment Cervical / Trunk Assessment: Kyphotic   Communication Communication Communication: No difficulties   Cognition Arousal/Alertness: Awake/alert Behavior During Therapy: WFL for tasks assessed/performed Overall Cognitive Status: Within Functional Limits for tasks assessed                                     General Comments       Exercises     Shoulder Instructions      Home Living Family/patient expects to be discharged to:: Private  residence Living Arrangements: Children Available Help at Discharge: Family Type of Home: House Home Access: Stairs to enter Technical brewer of Steps: 2   [place]: One level     Bathroom Shower/Tub: Teacher, early years/pre: [place] - quad           Prior Functioning/Environment Level of Independence: Independent with assistive device(s)        Comments: uses quad cane for ambulation. no assist needed for ADLs, was cooking        OT Problem List: Decreased strength;Impaired balance (sitting and/or standing);Decreased activity tolerance;Decreased knowledge of use of DME or AE      OT Treatment/Interventions: Self-care/ADL training;DME and/or AE instruction;Therapeutic activities;Therapeutic exercise;Patient/family education    OT Goals(Current goals can be found in the care plan section) Acute Rehab OT Goals Patient Stated Goal: home. to breathe better. OT Goal Formulation: With patient Time For Goal Achievement: 08/24/19 Potential to Achieve Goals: Good ADL Goals Pt Will Perform Grooming: with set-up;with supervision;standing Pt Will Perform Lower Body Bathing: with min guard assist;with supervision;with set-up;with adaptive equipment;sitting/lateral leans;sit to/from stand Pt Will Perform Lower Body Dressing: with min guard assist;with supervision;with set-up;with adaptive equipment;sitting/lateral leans;sit to/from stand Pt Will Transfer to Toilet: with min guard assist;with supervision;ambulating Pt Will Perform Toileting - Clothing Manipulation and hygiene: with supervision;sit to/from stand Additional ADL Goal #1: Pt will verbalize and demo 3 energy conservation techniques during ADLs and ADL mobility  OT Frequency: Min 2X/week   Barriers to D/C:    no barriers       Co-evaluation              AM-PAC OT "6 Clicks" Daily Activity     Outcome Measure Help from another person eating meals?: None Help from another person taking care of personal grooming?: A Little Help from another person toileting, which includes using toliet, bedpan, or urinal?: A Little Help from another person bathing (including washing, rinsing, drying)?: A Little Help from another person to put on and taking off regular upper body  clothing?: None Help from another person to put on and taking off regular lower body clothing?: A Little 6 Click Score: 20   End of Session Equipment Utilized During Treatment: Gait belt;Rolling walker;Oxygen Nurse Communication: Mobility status  Activity Tolerance: Patient tolerated treatment well Patient left: in bed;with call bell/phone within reach  OT Visit Diagnosis: Muscle weakness (generalized) (M62.81);Other abnormalities of gait and mobility (R26.89)                Time: [id number] OT Time Calculation (min): 30 min Charges:  OT General Charges $OT Visit: 1 Visit OT Evaluation $OT Eval Moderate Complexity: 1 Mod OT Treatments $Self Care/Home Management : 8-22 mins    Britt Bottom 08/10/2019, 1:27 PM

## 2019-08-10 NOTE — Progress Notes (Signed)
SATURATION QUALIFICATIONS: (This note is used to comply with regulatory documentation for home oxygen)  Patient Saturations on Room Air at Rest = 87%   Patient Saturations on 3Liters of oxygen while Ambulating 100%

## 2019-08-10 NOTE — Progress Notes (Signed)
PROGRESS NOTE  [name] [id number] DOB: 1946/04/20 DOA: 08/06/2019 PCP: Isaac Bliss, Rayford Halsted, MD   LOS: 4 days   Brief Narrative / Interim history: 73 year old female with history of COPD, left eye blindness, HTN, depression, sarcoidosis who came into the ER and was admitted on 08/06/2019 with significant shortness of breath, cough, wheezing, progressively getting worse over the past 3 days with no help with her home treatment.  In the ED underwent chest x-ray which showed bilateral infiltrates.  COVID-19 was negative.  Due to marked expiratory wheezing and pneumonia on the chest x-ray she was placed on antibiotics, and admitted to the hospital and also placed on IV steroids.  Subjective / 24h Interval events: -Feels okay at rest but significantly short of breath when working with physical therapy yesterday.  Noticed persistent wheezing   Assessment & Plan: Principal Problem Acute hypoxic respiratory failure due to COPD exacerbation /pneumonia -Continue antibiotics with ceftriaxone and azithromycin -Incentive spirometry, sit up in chair, continue steroids, antibiotics, nebulizers, antitussives -Remains with significant wheezing, overall improved from admission but still persistent.  She is very slow to improve  Active Problems Sarcoidosis/pulmonary fibrosis -Follows with pulmonology as an outpatient, suspect will need oxygen at home  Hyponatremia -Likely due to dehydration with hyponatremia, sodium has now normalized. -Discontinue fluids  Hypokalemia -Stable today, monitor  Hypertension -Continue home amlodipine, metoprolol, blood pressure stable  Hyperlipidemia -continue statin  Depression -Continue home medications   Scheduled Meds: . amLODipine  5 mg Oral Daily  . azithromycin  500 mg Oral Daily  . budesonide (PULMICORT) nebulizer solution  0.25 mg Nebulization BID  . enoxaparin (LOVENOX) injection  40 mg Subcutaneous Daily  . feeding supplement  (ENSURE ENLIVE)  237 mL Oral BID BM  . gabapentin  100 mg Oral QHS  . ipratropium-albuterol  3 mL Nebulization Once  . ipratropium-albuterol  3 mL Nebulization TID  . mouth rinse  15 mL Mouth Rinse BID  . methylPREDNISolone (SOLU-MEDROL) injection  60 mg Intravenous Q6H  . metoprolol succinate  50 mg Oral Daily  . mirtazapine  3.75 mg Oral QHS  . multivitamin with minerals  1 tablet Oral Daily  . nutrition supplement (JUVEN)  1 packet Oral BID BM  . pravastatin  40 mg Oral Daily  . sertraline  25 mg Oral QHS   Continuous Infusions: . cefTRIAXone (ROCEPHIN)  IV Stopped (08/09/19 2059)   PRN Meds:.albuterol, guaiFENesin-codeine, HYDROcodone-acetaminophen  DVT prophylaxis: Lovenox Code Status: Full code Family Communication: d/w patient Disposition Plan: home when ready   Consultants:  None   Procedures:  None   Microbiology  SARS-CoV-2, flu A, B 12/26-negative  Antimicrobials: Ceftriaxone 12/26 Azithromycin 12/26   Objective: Vitals:   08/09/19 2123 08/10/19 0503 08/10/19 0749 08/10/19 0836  BP: (!) 152/94 135/75  (!) 142/85  Pulse: (!) 106 93  (!) 106  Resp: 20 16    Temp: 98.1 F (36.7 C) 98.4 F (36.9 C)  (!) 97.5 F (36.4 C)  TempSrc: Oral Oral  Oral  SpO2: 100% 99% 97% 99%  Weight:      Height:        Intake/Output Summary (Last 24 hours) at 08/10/2019 1203 Last data filed at 08/10/2019 0500 Gross per 24 hour  Intake 777.94 ml  Output 1100 ml  Net -322.06 ml   Filed Weights   08/07/19 0511  Weight: 51.4 kg    Examination:  Constitutional: Eating breakfast, tachypneic, having to take breaks while eating food to breathe Eyes: No scleral icterus  ENMT: Moist mucous membranes Neck: normal, supple Respiratory: Bilateral persistent wheezing, but moves air well Cardiovascular: Regular rate and rhythm, no edema Abdomen: Soft, nontender, nondistended, bowel sounds positive Musculoskeletal: no clubbing / cyanosis.  Skin: No rashes seen Neurologic:  Nonfocal, equal strength Psychiatric: Normal judgment and insight. Alert and oriented x 3. Normal mood.    Data Reviewed: I have independently reviewed following labs and imaging studies   CBC: Recent Labs  Lab 08/06/19 1935 08/07/19 0605 08/08/19 0524 08/09/19 0606  WBC 7.2 3.7* 3.7* 7.4  HGB 11.5* 9.8* 8.8* 8.8*  HCT 33.9* 29.8* 27.4* 28.3*  MCV 84.1 86.1 87.0 89.6  PLT 309 258 271 [id number]   Basic Metabolic Panel: Recent Labs  Lab 08/06/19 1935 08/06/19 2322 08/07/19 0605 08/08/19 0524 08/09/19 0606  NA 121*  --  129* 136 134*  K 2.8*  --  3.7 2.9* 3.6  CL 82*  --  94* 102 104  CO2 25  --  24 23 24   GLUCOSE 89  --  112* 136* 128*  BUN 12  --  11 9 9   CREATININE 0.81 0.76 0.63 0.55 0.49  CALCIUM 8.2*  --  8.0* 7.6* 7.8*   Liver Function Tests: Recent Labs  Lab 08/07/19 0605 08/08/19 0524  AST 21 22  ALT 14 13  ALKPHOS 96 83  BILITOT 0.6 0.5  PROT 5.8* 5.4*  ALBUMIN 2.3* 2.1*   Coagulation Profile: No results for input(s): INR, PROTIME in the last 168 hours. HbA1C: No results for input(s): HGBA1C in the last 72 hours. CBG: No results for input(s): GLUCAP in the last 168 hours.  Recent Results (from the past 240 hour(s))  Respiratory Panel by RT PCR (Flu A&B, Covid) -     Status: None   Collection Time: 08/06/19  8:23 PM  Result Value Ref Range Status   SARS Coronavirus 2 by RT PCR NEGATIVE NEGATIVE Final   Influenza A by PCR NEGATIVE NEGATIVE Final   Influenza B by PCR NEGATIVE NEGATIVE Final    Comment: Performed at [facility], [place] [address] [place], [place] [address]  Culture, blood (routine x 2) Call MD if unable to obtain prior to antibiotics being given     Status: None (Preliminary result)   Collection Time: 08/06/19 11:22 PM   Specimen: BLOOD  Result Value Ref Range Status   Specimen Description   Final    BLOOD RIGHT ANTECUBITAL Performed at [facility] [address] [place], [place] [address]     Special Requests   Final    BOTTLES DRAWN AEROBIC AND ANAEROBIC Blood Culture results may not be optimal due to an excessive volume of blood received in culture bottles Performed at [facility] [address] [place], [place] [address]    Culture   Final    NO GROWTH 3 DAYS Performed at [facility], [place] [address], [place], [place] [address]    Report Status PENDING  Incomplete  Culture, blood (routine x 2) Call MD if unable to obtain prior to antibiotics being given     Status: None (Preliminary result)   Collection Time: 08/06/19 11:26 PM   Specimen: BLOOD  Result Value Ref Range Status   Specimen Description   Final    BLOOD LEFT ANTECUBITAL Performed at [facility] [address] [place], [place] [address]    Special Requests   Final    BOTTLES DRAWN AEROBIC AND ANAEROBIC Blood Culture adequate  volume Performed at [facility], [place] [address], [place], [place] [address]    Culture   Final    NO GROWTH 3 DAYS Performed at [facility], [place] [address] [place], [place] [address]    Report Status PENDING  Incomplete     Radiology Studies: No results found. Marzetta Board, MD, PhD [facility]  Between 7 am - 7 pm I am available, please contact me via Amion or Securechat  Between 7 pm - 7 am I am not available, please contact night coverage MD/APP via Amion

## 2019-08-11 LAB — CBC
HCT: 31.7 % — ABNORMAL LOW (ref 36.0–46.0)
Hemoglobin: 10.1 g/dL — ABNORMAL LOW (ref 12.0–15.0)
MCH: 28 pg (ref 26.0–34.0)
MCHC: 31.9 g/dL (ref 30.0–36.0)
MCV: 87.8 fL (ref 80.0–100.0)
Platelets: 326 10*3/uL (ref 150–400)
RBC: 3.61 MIL/uL — ABNORMAL LOW (ref 3.87–5.11)
RDW: 18.1 % — ABNORMAL HIGH (ref 11.5–15.5)
WBC: 9.2 10*3/uL (ref 4.0–10.5)
nRBC: 0 % (ref 0.0–0.2)

## 2019-08-11 LAB — BASIC METABOLIC PANEL
Anion gap: 11 (ref 5–15)
BUN: 13 mg/dL (ref 8–23)
CO2: 32 mmol/L (ref 22–32)
Calcium: 8.3 mg/dL — ABNORMAL LOW (ref 8.9–10.3)
Chloride: 90 mmol/L — ABNORMAL LOW (ref 98–111)
Creatinine, Ser: 0.52 mg/dL (ref 0.44–1.00)
GFR calc Af Amer: >60 mL/min (ref >60)
GFR calc non Af Amer: >60 mL/min (ref >60)
Glucose, Bld: 99 mg/dL (ref 70–99)
Potassium: 3 mmol/L — ABNORMAL LOW (ref 3.5–5.1)
Sodium: 133 mmol/L — ABNORMAL LOW (ref 135–145)

## 2019-08-11 MED ORDER — POTASSIUM CHLORIDE CRYS ER 20 MEQ PO TBCR
40.0000 meq | EXTENDED_RELEASE_TABLET | Freq: Once | ORAL | Status: AC
  Administered 2019-08-11: 11:00:00 40 meq via ORAL
  Filled 2019-08-11: qty 2

## 2019-08-11 MED ORDER — LOPERAMIDE HCL 2 MG PO CAPS
2.0000 mg | ORAL_CAPSULE | Freq: Once | ORAL | Status: AC
  Administered 2019-08-11: 11:00:00 2 mg via ORAL
  Filled 2019-08-11: qty 1

## 2019-08-11 NOTE — TOC Initial Note (Signed)
Transition of Care [facility]) - Initial/Assessment Note    Patient Details  Name: [name] MRN: [id number] Date of Birth: Jul 02, 1946  Transition [facility]) CM/SW Contact:    Lia Hopping, [place] Phone Number: 08/11/2019, 8:28 AM  Clinical Narrative:                 [facility]. Patient reports the day of admission is when services were to terminate. Patient will continue with [place] PT with additional OT. Patient reports she lives in the home with her daughter [name]. She uses a cane to ambulate and daughter assist her with bathing.  Patient reports feeling out of breath at home. Patient uses an inhaler. Patient will need oxygen at discharge.  Patient gave CSW permission to talk with her daughters. CSW reached out to Daughter [name] and Daughter Ronette answered the phone. She reiterated patient equipment needs. She expressed concerns about their current housing situation and problems with their landlord. CSW suggested following up with [facility] housing authority or social services.   CSW will follow for DME discharge needs.    Expected Discharge Plan: [place] Barriers to Discharge: No Barriers Identified   Patient Goals and CMS Choice     Choice offered to / list presented to : NA  Expected Discharge Plan and Services Expected Discharge Plan: [place] In-house Referral: Clinical Social Work Discharge Planning Services: CM Consult Post Acute Care Choice: [place] arrangements for the past 2 months: Single Family Home                           HH Arranged: PT, OT [facility]Patient active with Physical Therapy services  prior to admission) Date [place]: 08/10/19 Time [place]: [phone] Representative spoke with at [place]: [place]  Arrangements/Services Living arrangements for the past 2 months: [place] with:: Self Patient language and need for interpreter reviewed:: No Do you feel safe going back to the place where you live?: Yes      Need for Family Participation in Patient Care: Yes (Comment) Care giver support system in place?: Yes (comment) Current home services: Home OT, Home PT Criminal Activity/Legal Involvement Pertinent to Current Situation/Hospitalization: No - Comment as needed  Activities of Daily Living Home Assistive Devices/Equipment: Cane (specify quad or straight) ADL Screening (condition at time of admission) Patient's cognitive ability adequate to safely complete daily activities?: Yes Is the patient deaf or have difficulty hearing?: No Does the patient have difficulty seeing, even when wearing glasses/contacts?: Yes(L eye blindness) Does the patient have difficulty concentrating, remembering, or making decisions?: No Patient able to express need for assistance with ADLs?: Yes Does the patient have difficulty dressing or bathing?: No Independently performs ADLs?: Yes (appropriate for developmental age) Does the patient have difficulty walking or climbing stairs?: Yes Weakness of Legs: Both Weakness of Arms/Hands: None  Permission Sought/Granted Permission sought to share information with : Case Manager Permission granted to share information with : Yes, Verbal Permission Granted        Permission granted to share info w Relationship: Daughters     Emotional Assessment Appearance:: Appears stated age     Orientation: : Oriented to Self, Oriented to Place, Oriented to  Time, Oriented to Situation Alcohol / Substance Use:  Not Applicable Psych Involvement: No (comment)  Admission diagnosis:  Hypokalemia [E87.6] Hyponatremia [E87.1] COPD exacerbation (HCC) [J44.1] CAP (community acquired pneumonia) [J18.9] Pneumonia of both lungs due to infectious organism, unspecified  part of lung [J18.9] Patient Active Problem List   Diagnosis Date Noted  . CAP (community acquired pneumonia) 08/06/2019  . Acute respiratory failure with hypoxia ([place])   . Shock ([place])   . Central venous catheter in place   . AKI (acute kidney injury) ([place])   . Prolonged QT interval   . Pulmonary hypertension ([place])   . Hyponatremia   . Hypoglycemia   . Encephalopathy acute   . Severe sepsis ([place]) 05/31/2019  . Low bone mass 05/24/2018  . IBS (irritable bowel syndrome) 03/01/2018  . Diarrhea 12/22/2017  . Falls 12/26/2015  . Mixed incontinence urge and stress 12/26/2015  . Hypokalemia 10/03/2015  . Encounter for central line placement 09/12/2014  . Dyslipidemia 12/31/2012  . Allergic rhinitis 12/31/2012  . Osteoarthritis, hip, bilateral 11/01/2010  . Unintentional weight loss 03/19/2009  . 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 Sarcoidosis 02/05/2009  . GERD 01/18/2009  . Essential hypertension, benign 06/08/2007   PCP:  Isaac Bliss, Rayford Halsted, MD Pharmacy:   [facility], [place] - [address] AT [place] [address] Phone: [phone] Fax: [phone]  [facility] - [place], [place] [address] Phone: [phone] Fax: [phone]  [id number] [place], [place] [address] Phone: [phone] Fax: [phone]     Social Determinants of Health (SDOH) Interventions    Readmission Risk Interventions No flowsheet data found.

## 2019-08-11 NOTE — TOC Progression Note (Signed)
Transition of Care [facility]) - Progression Note    Patient Details  Name: [name] MRN: [id number] Date of Birth: 09/10/1945  Transition of Care [facility]) CM/SW [place], [place] Phone Number: 08/11/2019, 2:06 PM  Clinical Narrative:    CSW ordered 3 in 1 and O2 for patient to have at discharge. CSW reached out to the daughter Consuello Masse [phone] to provide an update.    Expected Discharge Plan: [place] Barriers to Discharge: No Barriers Identified  Expected Discharge Plan and Services Expected Discharge Plan: [place] In-house Referral: Clinical Social Work Discharge Planning Services: CM Consult Post Acute Care Choice: [place] arrangements for the past 2 months: Single Family Home                 DME Arranged: 3-N-1, Oxygen DME Agency: [facility] Date DME Agency Contacted: 08/11/19 Time DME Agency Contacted: [phone] Representative spoke with at DME Agency: Bethanne Ginger HH Arranged: PT, OT [place] [facility]Patient active with Physical Therapy services  prior to admission) Date HH Agency Contacted: 08/10/19 Time [place]: [phone] Representative spoke with at [place]: [place] ([place]) Interventions    Readmission Risk Interventions No flowsheet data found.

## 2019-08-11 NOTE — Progress Notes (Signed)
Occupational Therapy Treatment Patient Details Name: [name] MRN: [id number] DOB: Sep 30, 1945 Today's Date: 08/11/2019    History of present illness 73 yo female admitted with acute respiratory failure. Hx of sarcoidosis, COPD, L eye blindness, pulm HTN, recurrent R hip dislocation   OT comments  Pt stated she didn't feel really well today in general. Did not want to wash up this am.  Performed oral care and toileting. Will continue with POC  Follow Up Recommendations  Home health OT    Equipment Recommendations  3 in 1 bedside commode(tub bench is out of pocket, if she wants)  Otherwise, recommend initial sponge bathing due to endurance  Recommendations for Other Services      Precautions / Restrictions Precautions Precautions: Fall Precaution Comments: monitor O2. Restrictions Weight Bearing Restrictions: No       Mobility Bed Mobility         Supine to sit: Supervision;HOB elevated Sit to supine: Supervision      Transfers   Equipment used: Rolling walker (2 wheeled)   Sit to Stand: Min assist         General transfer comment: steadying assistance and light assist to stand up    Balance                                           ADL either performed or assessed with clinical judgement   ADL       Grooming: Oral care;Supervision/safety;Standing                   Toilet Transfer: Minimal assistance;BSC;RW   Toileting- Clothing Manipulation and Hygiene: Minimal assistance;Sit to/from stand         General ADL Comments: ambulated to sink for oral care.  Used commode and returned to bed at end of session     Vision       Perception     Praxis      Cognition Arousal/Alertness: Awake/alert Behavior During Therapy: WFL for tasks assessed/performed Overall Cognitive Status: Within Functional Limits for tasks assessed                                          Exercises     Shoulder  Instructions       General Comments      Pertinent Vitals/ Pain       Pain Assessment: No/denies pain  Home Living                                          Prior Functioning/Environment              Frequency  Min 2X/week        Progress Toward Goals  OT Goals(current goals can now be found in the care plan section)  Progress towards OT goals: Progressing toward goals     Plan      Co-evaluation                 AM-PAC OT "6 Clicks" Daily Activity     Outcome Measure   Help from another person eating meals?: None Help from another person taking care of personal grooming?: A Little  Help from another person toileting, which includes using toliet, bedpan, or urinal?: A Little Help from another person bathing (including washing, rinsing, drying)?: A Little Help from another person to put on and taking off regular upper body clothing?: A Little Help from another person to put on and taking off regular lower body clothing?: A Little 6 Click Score: 19    End of Session    OT Visit Diagnosis: Muscle weakness (generalized) (M62.81);Other abnormalities of gait and mobility (R26.89)   Activity Tolerance Patient limited by fatigue   Patient Left in bed;with call bell/phone within reach   Nurse Communication          Time: [id number] OT Time Calculation (min): 25 min  Charges: OT General Charges $OT Visit: 1 Visit OT Treatments $Self Care/Home Management : 23-37 mins  Mavric Cortright S, OTR/L Acute Rehabilitation Services 08/11/2019   [place] 08/11/2019, 10:36 AM

## 2019-08-11 NOTE — Progress Notes (Signed)
PROGRESS NOTE  [name] [id number] DOB: 20-Feb-1946 DOA: 08/06/2019 PCP: Isaac Bliss, Rayford Halsted, MD   LOS: 5 days   Brief Narrative / Interim history: 73 year old female with history of COPD, sarcoidosis, pulmonary fibrosis left eye blindness, HTN, depression, who came into the ER and was admitted on 08/06/2019 with significant shortness of breath, cough, wheezing, progressively getting worse over the past 3 days with no help with her home treatment.  In the ED underwent chest x-ray which showed bilateral infiltrates.  COVID-19 was negative.  Due to marked expiratory wheezing and pneumonia on the chest x-ray she was placed on antibiotics, and admitted to the hospital and also placed on IV steroids.  Subjective / 24h Interval events: -Complains of profuse diarrhea since last night, no fever or chills, complains of dyspnea with minimal activity, coughing spells have improved   Assessment & Plan:  Acute hypoxic respiratory failure due to COPD exacerbation /pneumonia -Has underlying COPD, sarcoidosis with severe pulmonary fibrosis -slow clinical improvement with IV antibiotics, steroids, nebulizations, antitussives - -Continue antibiotics with ceftriaxone and azithromycin -Incentive spirometry, sit up in chair, continue steroids, antibiotics, nebulizers, antitussives -Remains with significant wheezing, overall improved from admission but still persistent.  She is very slow to improve  Active Problems Sarcoidosis/pulmonary fibrosis -Follows with pulmonology as an outpatient, suspect will need oxygen at home  Hyponatremia -Likely due to dehydration with hyponatremia, sodium has now normalized. -Discontinue fluids  Hypokalemia -Stable today, monitor  Hypertension -Continue home amlodipine, metoprolol, blood pressure stable  Hyperlipidemia -continue statin  Depression -Continue home medications   DVT prophylaxis: Lovenox Code Status: Full code Family  Communication: d/w patient, called and updated daughter Juda Defreitas [phone] Disposition Plan: home when ready   Scheduled Meds: . amLODipine  5 mg Oral Daily  . budesonide (PULMICORT) nebulizer solution  0.25 mg Nebulization BID  . enoxaparin (LOVENOX) injection  40 mg Subcutaneous Daily  . gabapentin  100 mg Oral QHS  . ipratropium-albuterol  3 mL Nebulization Once  . ipratropium-albuterol  3 mL Nebulization TID  . mouth rinse  15 mL Mouth Rinse BID  . methylPREDNISolone (SOLU-MEDROL) injection  60 mg Intravenous Q6H  . metoprolol succinate  50 mg Oral Daily  . mirtazapine  3.75 mg Oral QHS  . multivitamin with minerals  1 tablet Oral Daily  . nutrition supplement (JUVEN)  1 packet Oral BID BM  . pravastatin  40 mg Oral Daily  . sertraline  25 mg Oral QHS   Continuous Infusions:  PRN Meds:.albuterol, guaiFENesin-codeine, HYDROcodone-acetaminophen   Consultants:  None   Procedures:  None   Microbiology  SARS-CoV-2, flu A, B 12/26-negative  Antimicrobials: Ceftriaxone 12/26 Azithromycin 12/26   Objective: Vitals:   08/11/19 0452 08/11/19 0754 08/11/19 0952 08/11/19 1307  BP: (!) 143/80  (!) 146/94 (!) 163/93  Pulse: 84  96 89  Resp: 19  18 14   Temp: 98.1 F (36.7 C)  98.6 F (37 C) 98.7 F (37.1 C)  TempSrc: Oral  Oral Oral  SpO2: 99% 97% 95% 100%  Weight:      Height:        Intake/Output Summary (Last 24 hours) at 08/11/2019 1425 Last data filed at 08/11/2019 0453 Gross per 24 hour  Intake --  Output 1100 ml  Net -1100 ml   Filed Weights   08/07/19 0511  Weight: 51.4 kg    Examination:  Gen: Awake, Alert, Oriented X 3, sitting up in bed, no distress HEENT: PERRLA, Neck supple, no JVD Lungs: Coarse  crackles diffusely, poor air movement bilaterally CVS: RRR,No Gallops,Rubs or new Murmurs Abd: soft, Non tender, non distended, BS present Extremities: No edema Skin: no new rashes Psychiatric: Normal judgment and insight. Alert and  oriented x 3. Normal mood.    Data Reviewed: I have independently reviewed following labs and imaging studies   CBC: Recent Labs  Lab 08/06/19 1935 08/07/19 0605 08/08/19 0524 08/09/19 0606 08/11/19 0606  WBC 7.2 3.7* 3.7* 7.4 9.2  HGB 11.5* 9.8* 8.8* 8.8* 10.1*  HCT 33.9* 29.8* 27.4* 28.3* 31.7*  MCV 84.1 86.1 87.0 89.6 87.8  PLT 309 258 271 299 [id number]   Basic Metabolic Panel: Recent Labs  Lab 08/06/19 1935 08/06/19 2322 08/07/19 0605 08/08/19 0524 08/09/19 0606 08/11/19 0606  NA 121*  --  129* 136 134* 133*  K 2.8*  --  3.7 2.9* 3.6 3.0*  CL 82*  --  94* 102 104 90*  CO2 25  --  24 23 24  32  GLUCOSE 89  --  112* 136* 128* 99  BUN 12  --  11 9 9 13   CREATININE 0.81 0.76 0.63 0.55 0.49 0.52  CALCIUM 8.2*  --  8.0* 7.6* 7.8* 8.3*   Liver Function Tests: Recent Labs  Lab 08/07/19 0605 08/08/19 0524  AST 21 22  ALT 14 13  ALKPHOS 96 83  BILITOT 0.6 0.5  PROT 5.8* 5.4*  ALBUMIN 2.3* 2.1*   Coagulation Profile: No results for input(s): INR, PROTIME in the last 168 hours. HbA1C: No results for input(s): HGBA1C in the last 72 hours. CBG: No results for input(s): GLUCAP in the last 168 hours.  Recent Results (from the past 240 hour(s))  Respiratory Panel by RT PCR (Flu A&B, Covid) -     Status: None   Collection Time: 08/06/19  8:23 PM  Result Value Ref Range Status   SARS Coronavirus 2 by RT PCR NEGATIVE NEGATIVE Final   Influenza A by PCR NEGATIVE NEGATIVE Final   Influenza B by PCR NEGATIVE NEGATIVE Final    Comment: Performed at [facility], [place] [address], [place], [place] [address]  Culture, blood (routine x 2) Call MD if unable to obtain prior to antibiotics being given     Status: None (Preliminary result)   Collection Time: 08/06/19 11:22 PM   Specimen: BLOOD  Result Value Ref Range Status   Specimen Description   Final    BLOOD RIGHT ANTECUBITAL Performed at [facility] [address], [place], [place]  [address]    Special Requests   Final    BOTTLES DRAWN AEROBIC AND ANAEROBIC Blood Culture results may not be optimal due to an excessive volume of blood received in culture bottles Performed at [facility] [address], [place], [place] [address]    Culture   Final    NO GROWTH 4 DAYS Performed at [facility], [place] [address] [place], [place] [address]    Report Status PENDING  Incomplete  Culture, blood (routine x 2) Call MD if unable to obtain prior to antibiotics being given     Status: None (Preliminary result)   Collection Time: 08/06/19 11:26 PM   Specimen: BLOOD  Result Value Ref Range Status   Specimen Description   Final    BLOOD LEFT ANTECUBITAL Performed at [facility] [address], [place], [place] [address]    Special Requests   Final    BOTTLES DRAWN AEROBIC AND ANAEROBIC Blood Culture  adequate volume Performed at [facility] [address] [place], [place] [address]    Culture   Final    NO GROWTH 4 DAYS Performed at [facility], [place] [address] [place], [place] [address]    Report Status PENDING  Incomplete     Radiology Studies: No results found.   Domenic Polite, MD [facility]

## 2019-08-12 LAB — BASIC METABOLIC PANEL
Anion gap: 11 (ref 5–15)
BUN: 13 mg/dL (ref 8–23)
CO2: 37 mmol/L — ABNORMAL HIGH (ref 22–32)
Calcium: 8.6 mg/dL — ABNORMAL LOW (ref 8.9–10.3)
Chloride: 88 mmol/L — ABNORMAL LOW (ref 98–111)
Creatinine, Ser: 0.57 mg/dL (ref 0.44–1.00)
GFR calc Af Amer: >60 mL/min (ref >60)
GFR calc non Af Amer: >60 mL/min (ref >60)
Glucose, Bld: 115 mg/dL — ABNORMAL HIGH (ref 70–99)
Potassium: 2.9 mmol/L — ABNORMAL LOW (ref 3.5–5.1)
Sodium: 136 mmol/L (ref 135–145)

## 2019-08-12 LAB — CULTURE, BLOOD (ROUTINE X 2)
Culture: NO GROWTH
Culture: NO GROWTH
Special Requests: ADEQUATE

## 2019-08-12 LAB — CBC
HCT: 32.3 % — ABNORMAL LOW (ref 36.0–46.0)
Hemoglobin: 10.5 g/dL — ABNORMAL LOW (ref 12.0–15.0)
MCH: 28.2 pg (ref 26.0–34.0)
MCHC: 32.5 g/dL (ref 30.0–36.0)
MCV: 86.8 fL (ref 80.0–100.0)
Platelets: 398 10*3/uL (ref 150–400)
RBC: 3.72 MIL/uL — ABNORMAL LOW (ref 3.87–5.11)
RDW: 17.7 % — ABNORMAL HIGH (ref 11.5–15.5)
WBC: 9.3 10*3/uL (ref 4.0–10.5)
nRBC: 0 % (ref 0.0–0.2)

## 2019-08-12 MED ORDER — POTASSIUM CHLORIDE CRYS ER 20 MEQ PO TBCR
40.0000 meq | EXTENDED_RELEASE_TABLET | ORAL | Status: AC
  Administered 2019-08-12 (×3): 40 meq via ORAL
  Filled 2019-08-12 (×3): qty 2

## 2019-08-12 MED ORDER — METHYLPREDNISOLONE SODIUM SUCC 125 MG IJ SOLR
60.0000 mg | Freq: Two times a day (BID) | INTRAMUSCULAR | Status: DC
  Administered 2019-08-12 – 2019-08-13 (×2): 60 mg via INTRAVENOUS
  Filled 2019-08-12 (×2): qty 2

## 2019-08-12 NOTE — Progress Notes (Signed)
Physical Therapy Treatment Patient Details Name: [name] MRN: [id number] DOB: November 12, 1945 Today's Date: 08/12/2019    History of Present Illness 74 yo female admitted with acute respiratory failure. Hx of sarcoidosis, COPD, L eye blindness, pulm HTN, recurrent R hip dislocation    PT Comments    Pt in bed on 2 lts sats 100%.  Trial RA at rest 92% and trail RA with amb 82% which pt required 3 lts to achieve >90% with activity.  Assisted OOB.  General transfer comment: assisted from elevated bed to [facility] then to standing for gait with minmal assist and <25% VC's on proper hand placment and safety with turns.General Gait Details: tolerated an increased distance with "no coughing" pt was pleased but required 3 lts nasal during gait to achieve sats >90%.  Dyspnea 1/4 vs 3/4 last session. Pt plans to D/C back home "hopefully tomorrow" stated pt  Follow Up Recommendations  Home health PT;Supervision/Assistance - 24 hour     Equipment Recommendations  Rolling walker with 5" wheels;3in1 (PT)    Recommendations for Other Services       Precautions / Restrictions Precautions Precautions: Fall Precaution Comments: monitor O2. Restrictions Weight Bearing Restrictions: No    Mobility  Bed Mobility Overal bed mobility: Needs Assistance Bed Mobility: Supine to Sit;Sit to Supine     Supine to sit: Supervision;HOB elevated Sit to supine: Supervision   General bed mobility comments: for safety. increased time.  Transfers Overall transfer level: Needs assistance Equipment used: Rolling walker (2 wheeled) Transfers: Sit to/from [facility] Sit to Stand: Supervision;Min guard Stand pivot transfers: Supervision;Min guard       General transfer comment: assisted from elevated bed to [facility] then to standing for gait with minmal assist and <25% VC's on proper hand placment and safety with turns.  Ambulation/Gait Ambulation/Gait assistance: Min IT consultant (Feet): 45 Feet Assistive device: Rolling walker (2 wheeled) Gait Pattern/deviations: Step-through pattern;Decreased stride length Gait velocity: decreased   General Gait Details: tolerated an increased distance with "no coughing" pt was pleased but required 3 lts nasal during gait to achieve sats >90%.  Dyspnea 1/4 vs 3/4 last session.   Stairs             Wheelchair Mobility    Modified Rankin (Stroke Patients Only)       Balance                                            Cognition Arousal/Alertness: Awake/alert Behavior During Therapy: WFL for tasks assessed/performed Overall Cognitive Status: Within Functional Limits for tasks assessed                                 General Comments: funny and witty      Exercises      General Comments        Pertinent Vitals/Pain Pain Assessment: No/denies pain    Home Living                      Prior Function            PT Goals (current goals can now be found in the care plan section) Progress towards PT goals: Progressing toward goals    Frequency    Min 3X/week  PT Plan Current plan remains appropriate    Co-evaluation              AM-PAC PT "6 Clicks" Mobility   Outcome Measure  Help needed turning from your back to your side while in a flat bed without using bedrails?: A Little Help needed moving from lying on your back to sitting on the side of a flat bed without using bedrails?: A Little Help needed moving to and from a bed to a chair (including a wheelchair)?: A Little Help needed standing up from a chair using your arms (e.g., wheelchair or bedside chair)?: A Little Help needed to walk in hospital room?: A Little Help needed climbing 3-5 steps with a railing? : A Lot 6 Click Score: 17    End of Session Equipment Utilized During Treatment: Oxygen;Gait belt Activity Tolerance: Patient tolerated treatment well Patient left: in  bed;with call bell/phone within reach;with bed alarm set Nurse Communication: Mobility status PT Visit Diagnosis: Muscle weakness (generalized) (M62.81);Difficulty in walking, not elsewhere classified (R26.2);Unsteadiness on feet (R26.81)     Time: [id number] PT Time Calculation (min) (ACUTE ONLY): 32 min  Charges:  $Gait Training: 8-22 mins $Therapeutic Activity: 8-22 mins                     Rica Koyanagi  PTA Acute  Rehabilitation Services Pager      [phone] Office      [phone]

## 2019-08-12 NOTE — TOC Progression Note (Signed)
Transition of Care [facility]) - Progression Note    Patient Details  Name: [name] MRN: [id number] Date of Birth: [birth year]  Transition of Care [facility]) CM/SW [place], [place] Phone Number: 08/12/2019, 2:29 PM  Clinical Narrative:    Per family request, CSW reached out to daughter [name] to provide update, left voicemail.  H.H and DME arranged, no other needs identified.   Expected Discharge Plan: [place] Barriers to Discharge: No Barriers Identified  Expected Discharge Plan and Services Expected Discharge Plan: [place] In-house Referral: Clinical Social Work Discharge Planning Services: CM Consult Post Acute Care Choice: [place] arrangements for the past 2 months: Single Family Home                 DME Arranged: 3-N-1, Oxygen DME Agency: [employer] Date DME Agency Contacted: 08/11/19 Time DME Agency Contacted: [phone] Representative spoke with at DME Agency: Bethanne Ginger HH Arranged: PT, OT [place] Agency: [facility])(Patient active with Physical Therapy services  prior to admission) Date HH Agency Contacted: 08/10/19 Time [place]: [phone] Representative spoke with at [place]: [place] ([place]) Interventions    Readmission Risk Interventions No flowsheet data found.

## 2019-08-12 NOTE — Progress Notes (Signed)
PROGRESS NOTE  [name] [id number] DOB: April 21, 1946 DOA: 08/06/2019 PCP: Isaac Bliss, Rayford Halsted, MD   LOS: 6 days   Brief Narrative / Interim history: 74 year old female with history of COPD, sarcoidosis, pulmonary fibrosis left eye blindness, HTN, depression, who came into the ER and was admitted on 08/06/2019 with significant shortness of breath, cough, wheezing, progressively getting worse over the past 3 days with no help with her home treatment.  In the ED underwent chest x-ray which showed bilateral infiltrates.  COVID-19 was negative.  Due to marked expiratory wheezing and pneumonia on the chest x-ray she was placed on antibiotics, and admitted to the hospital and also placed on IV steroids.  Subjective / 24h Interval events: -had diarrhea overnight, also c/o incontinence with urine   Assessment & Plan:  Acute hypoxic respiratory failure due to COPD exacerbation /pneumonia -Has underlying COPD, sarcoidosis with severe pulmonary fibrosis -slow clinical improvement with IV antibiotics, steroids, nebulizations, antitussives -Will cut down IV steroids today, antibiotics discontinued given severe diarrhea -Continue duo nebs, antitussives -Ambulatory O2 sats, documented need for home O2, home health RN and O2 set up -Discharge planning  Sarcoidosis/pulmonary fibrosis -Follows with pulmonology as an outpatient,  will need oxygen at home  Diarrhea versus incontinence -Monitor, antibiotics and excessive Ensure/supplement use could be contributing, no fever or leukocytosis, supportive care, monitor, asked her to cut down on Ensure and stopped antibiotics  Hyponatremia -Likely due to dehydration with hyponatremia, sodium has now normalized.  Hypokalemia -Due to diarrhea, replace  Hypertension -Continue home amlodipine, metoprolol, blood pressure stable  Hyperlipidemia -continue statin  Depression -Continue home medications  DVT prophylaxis: Lovenox Code  Status: Full code Family Communication: d/w patient, called and updated daughter Shanterra Moyer 12/31 [id number] Disposition Plan: home tomorrow if stable  Scheduled Meds: . amLODipine  5 mg Oral Daily  . budesonide (PULMICORT) nebulizer solution  0.25 mg Nebulization BID  . enoxaparin (LOVENOX) injection  40 mg Subcutaneous Daily  . gabapentin  100 mg Oral QHS  . ipratropium-albuterol  3 mL Nebulization Once  . ipratropium-albuterol  3 mL Nebulization TID  . mouth rinse  15 mL Mouth Rinse BID  . methylPREDNISolone (SOLU-MEDROL) injection  60 mg Intravenous Q12H  . metoprolol succinate  50 mg Oral Daily  . mirtazapine  3.75 mg Oral QHS  . multivitamin with minerals  1 tablet Oral Daily  . nutrition supplement (JUVEN)  1 packet Oral BID BM  . pravastatin  40 mg Oral Daily  . sertraline  25 mg Oral QHS   Continuous Infusions:  PRN Meds:.albuterol, guaiFENesin-codeine, HYDROcodone-acetaminophen   Consultants:  None   Procedures:  None   Microbiology  SARS-CoV-2, flu A, B 12/26-negative  Antimicrobials: Ceftriaxone 12/26 Azithromycin 12/26   Objective: Vitals:   08/12/19 0614 08/12/19 0815 08/12/19 1351 08/12/19 1355  BP: (!) 159/85  (!) 127/91   Pulse: 78  82   Resp: 16  19   Temp: 97.7 F (36.5 C)  98.4 F (36.9 C)   TempSrc:   Oral   SpO2: 100% 100% 100% 100%  Weight:      Height:        Intake/Output Summary (Last 24 hours) at 08/12/2019 1506 Last data filed at 08/12/2019 0900 Gross per 24 hour  Intake 480 ml  Output 700 ml  Net -220 ml   Filed Weights   08/07/19 0511  Weight: 51.4 kg    Examination:  Gen: Awake, Alert, Oriented X 3, sitting up in bed, no distress HEENT:  PERRLA, Neck supple, no JVD Lungs: Lower lung with coarse crackles and diffuse expiratory wheezes CVS: RRR,No Gallops,Rubs or new Murmurs Abd: soft, Non tender, non distended, BS present Extremities: No edema  skin: no new rashes Psychiatric: Normal judgment and insight. Alert  and oriented x 3. Normal mood.    Data Reviewed: I have independently reviewed following labs and imaging studies   CBC: Recent Labs  Lab 08/07/19 0605 08/08/19 0524 08/09/19 0606 08/11/19 0606 08/12/19 0523  WBC 3.7* 3.7* 7.4 9.2 9.3  HGB 9.8* 8.8* 8.8* 10.1* 10.5*  HCT 29.8* 27.4* 28.3* 31.7* 32.3*  MCV 86.1 87.0 89.6 87.8 86.8  PLT 258 271 299 326 [id number]   Basic Metabolic Panel: Recent Labs  Lab 08/07/19 0605 08/08/19 0524 08/09/19 0606 08/11/19 0606 08/12/19 0523  NA 129* 136 134* 133* 136  K 3.7 2.9* 3.6 3.0* 2.9*  CL 94* 102 104 90* 88*  CO2 24 23 24  32 37*  GLUCOSE 112* 136* 128* 99 115*  BUN 11 9 9 13 13   CREATININE 0.63 0.55 0.49 0.52 0.57  CALCIUM 8.0* 7.6* 7.8* 8.3* 8.6*   Liver Function Tests: Recent Labs  Lab 08/07/19 0605 08/08/19 0524  AST 21 22  ALT 14 13  ALKPHOS 96 83  BILITOT 0.6 0.5  PROT 5.8* 5.4*  ALBUMIN 2.3* 2.1*   Coagulation Profile: No results for input(s): INR, PROTIME in the last 168 hours. HbA1C: No results for input(s): HGBA1C in the last 72 hours. CBG: No results for input(s): GLUCAP in the last 168 hours.  Recent Results (from the past 240 hour(s))  Respiratory Panel by RT PCR (Flu A&B, Covid) -     Status: None   Collection Time: 08/06/19  8:23 PM  Result Value Ref Range Status   SARS Coronavirus 2 by RT PCR NEGATIVE NEGATIVE Final   Influenza A by PCR NEGATIVE NEGATIVE Final   Influenza B by PCR NEGATIVE NEGATIVE Final    Comment: Performed at [facility], [place] [address] [place], [place] [address]  Culture, blood (routine x 2) Call MD if unable to obtain prior to antibiotics being given     Status: None   Collection Time: 08/06/19 11:22 PM   Specimen: BLOOD  Result Value Ref Range Status   Specimen Description   Final    BLOOD RIGHT ANTECUBITAL Performed at [facility] [address], [place], [place] [address]    Special Requests   Final    BOTTLES DRAWN AEROBIC AND  ANAEROBIC Blood Culture results may not be optimal due to an excessive volume of blood received in culture bottles Performed at [facility] [address] [place], [place] [address]    Culture   Final    NO GROWTH 5 DAYS Performed at [facility], [place] [address] [place], [place] [address]    Report Status 08/12/2019 FINAL  Final  Culture, blood (routine x 2) Call MD if unable to obtain prior to antibiotics being given     Status: None   Collection Time: 08/06/19 11:26 PM   Specimen: BLOOD  Result Value Ref Range Status   Specimen Description   Final    BLOOD LEFT ANTECUBITAL Performed at [facility] [address] [place], [place] [address]    Special Requests   Final    BOTTLES DRAWN AEROBIC AND ANAEROBIC Blood Culture adequate volume Performed at [facility] [address], [place], [place] [address]  Culture   Final    NO GROWTH 5 DAYS Performed at [facility], [place] [address], [place],  [address]    Report Status 08/12/2019 FINAL  Final     Radiology Studies: No results found.   Domenic Polite, MD [facility]

## 2019-08-13 LAB — BASIC METABOLIC PANEL
Anion gap: 9 (ref 5–15)
BUN: 14 mg/dL (ref 8–23)
CO2: 35 mmol/L — ABNORMAL HIGH (ref 22–32)
Calcium: 8.8 mg/dL — ABNORMAL LOW (ref 8.9–10.3)
Chloride: 90 mmol/L — ABNORMAL LOW (ref 98–111)
Creatinine, Ser: 0.58 mg/dL (ref 0.44–1.00)
GFR calc Af Amer: >60 mL/min (ref >60)
GFR calc non Af Amer: >60 mL/min (ref >60)
Glucose, Bld: 96 mg/dL (ref 70–99)
Potassium: 4.2 mmol/L (ref 3.5–5.1)
Sodium: 134 mmol/L — ABNORMAL LOW (ref 135–145)

## 2019-08-13 MED ORDER — PREDNISONE 20 MG PO TABS: 20.0000 mg | ORAL_TABLET | Freq: Every day | ORAL | 0 refills | Status: DC

## 2019-08-13 NOTE — Progress Notes (Signed)
Pt discharged to home with daughters. Discharge instructions and medication education provided to the pt and daughter.

## 2019-08-13 NOTE — TOC Progression Note (Signed)
Transition of Care [facility]) - Progression Note    Patient Details  Name: [name] MRN: [id number] Date of Birth: 31-Mar-1946  Transition of Care [facility]) CM/SW Contact  Joaquin Courts, RN Phone Number: 08/13/2019, 11:44 AM  Clinical Narrative:    [facility], [facility] services added on.     Expected Discharge Plan: [place] Barriers to Discharge: No Barriers Identified  Expected Discharge Plan and Services Expected Discharge Plan: [place] In-house Referral: Clinical Social Work Discharge Planning Services: CM Consult Post Acute Care Choice: [place] arrangements for the past 2 months: Single Family Home Expected Discharge Date: 08/13/19               DME Arranged: 3-N-1, Oxygen DME Agency: [employer] Date DME Agency Contacted: 08/11/19 Time DME Agency [phone] Representative spoke with at DME Agency: Bethanne Ginger HH Arranged: PT, OT [place] Agency: [facility]Patient active with Physical Therapy services  prior to admission) Date HH Agency Contacted: 08/10/19 Time [place]: [phone] Representative spoke with at [place]: [place] ([place]) Interventions    Readmission Risk Interventions No flowsheet data found.

## 2019-08-13 NOTE — Progress Notes (Signed)
Informed the Social Worker that this pt is now requiring a Occupational hygienist. Paged the MD and the reason for the Home Health RN is due to chronic disease management for her COPD and for her going home with oxygen. Writer will follow-up with Social Work.

## 2019-08-13 NOTE — Discharge Summary (Signed)
Physician Discharge Summary  [name] [id number] DOB: Aug 19, 1945 DOA: 08/06/2019  PCP: Isaac Bliss, Rayford Halsted, MD  Admit date: 08/06/2019 Discharge date: 08/13/2019  Time spent:35 minutes  Recommendations for Outpatient Follow-up:  1. PCP Dr.Hernandez in 1 week, discharged home on prednisone taper, home health RN and home O2 2. 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 Chronic respiratory failure Community-acquired pneumonia   Essential hypertension, benign   GERD   Hypokalemia   AKI (acute kidney injury) ([place])   Hyponatremia   CAP (community acquired pneumonia)   Discharge Condition: Stable  Diet recommendation: Low-sodium  Filed Weights   08/07/19 0511  Weight: 51.4 kg    History of present illness:  74 year old female with history of COPD, sarcoidosis, pulmonary fibrosis left eye blindness, HTN, depression, who came into the ER and was admitted on 08/06/2019 with significant shortness of breath, cough, wheezing, progressively getting worse over the past 3 days with no help with her home treatment.  In the ED underwent chest x-ray which showed bilateral infiltrates in the background of severe pulmonary fibrosis.  COVID-19 was negative.  Due to marked expiratory wheezing she was admitted, started on steroids, antibiotic etc.  Hospital Course:   Acute hypoxic respiratory failure due to COPD exacerbation /community-acquired pneumonia -Her two-view chest x-ray noted bilateral infiltrates which are new in the background of severe pulmonary fibrosis,, treated for pneumonia and COPD exacerbation -slow clinical improvement with IV antibiotics, steroids, nebulizations, antitussives -Her last CT chest was in 06/2018 which  noted severe parenchymal pulmonary fibrosis consistent with sarcoidosis, extensive hilar/mediastinal adenopathy and subpleural nodular opacities all consistent with her underlying sarcoidosis -She had slow clinical improvement with IV steroids, antibiotics, duo nebs, antitussives -after 5days, Abx were stopped due to diarrhea -Now transitioned to a steroid taper -She ambulated with physical therapy, did meet criteria for home oxygen, home health services along with home O2 was set up -She is advised to follow-up with pulmonary in the office, I am concerned about the severity of her lung disease and the fact that she has not had treatment for sarcoidosis in a very long time, updated daughter and recommended that she ensure pulmonary follow-up  Sarcoidosis/pulmonary fibrosis -Follows with pulmonology as an outpatient, set up with home O2 now  Diarrhea versus incontinence -Resolved, due to antibiotics and excessive Ensure/supplement use  Hyponatremia -Likely due to dehydration with hyponatremia, sodium has now normalized.  Hypokalemia -Due to diarrhea, replaced  Hypertension -Continue home amlodipine, metoprolol, blood pressure stable  Hyperlipidemia -continue statin  Depression -Continue home medications  Discharge Exam: Vitals:   08/13/19 0836 08/13/19 1342  BP:  (!) 151/90  Pulse:  79  Resp:  16  Temp:  98.6 F (37 C)  SpO2: 98% 100%    General: AAOx3 Cardiovascular: S1-S2, regular rhythm Respiratory: Improving air movement, few expiratory wheezes  Discharge Instructions   Discharge Instructions    Diet - low sodium heart healthy   Complete by: As directed    Increase activity slowly   Complete by: As directed      Allergies as of 08/13/2019      Reactions   Ace Inhibitors Swelling   Lisinopril Swelling   REACTION: Angioedema   Valsartan Swelling   REACTION: angioedema.  Pt should not get ACEI or ARBs of any kind due  to angioedema.   Aspirin Other (See  Comments)   REACTION: GI ulcer.   Penicillins    Childhood allergy Has patient had a PCN reaction causing immediate rash, facial/tongue/throat swelling, SOB or lightheadedness with hypotension: Unknown Has patient had a PCN reaction causing severe rash involving mucus membranes or skin necrosis: Unknown Has patient had a PCN reaction that required hospitalization: Unknown Has patient had a PCN reaction occurring within the last 10 years: Unknown If all of the above answers are "NO", then may proceed with Cephalosporin use.      Medication List    STOP taking these medications   doxycycline 100 MG capsule Commonly known as: VIBRAMYCIN   etodolac 400 MG 24 hr tablet Commonly known as: LODINE XL     TAKE these medications   albuterol 108 (90 Base) MCG/ACT inhaler Commonly known as: VENTOLIN HFA Inhale 1-2 puffs into the lungs every 6 (six) hours as needed for wheezing or shortness of breath.   albuterol (2.5 MG/3ML) 0.083% nebulizer solution Commonly known as: PROVENTIL Take 3 mLs (2.5 mg total) by nebulization every 6 (six) hours as needed. As needed for SOB   amLODipine 5 MG tablet Commonly known as: NORVASC Take 1 tablet (5 mg total) by mouth daily.   gabapentin 100 MG capsule Commonly known as: NEURONTIN Take 100 mg by mouth at bedtime.   hydrochlorothiazide 25 MG tablet Commonly known as: HYDRODIURIL Take 25 mg by mouth daily.   HYDROcodone-acetaminophen 5-325 MG tablet Commonly known as: NORCO/VICODIN Take 1 tablet by mouth every 6 (six) hours as needed for moderate pain.   ipratropium-albuterol 0.5-2.5 (3) MG/3ML Soln Commonly known as: DUONEB USE 3 ML VIA NEBULIZER EVERY 4 HOURS AS NEEDED What changed: See the new instructions.   Magnesium 500 MG Tabs Take 500 mg by mouth at bedtime.   metoprolol succinate 50 MG 24 hr tablet Commonly known as: TOPROL-XL TAKE 1 TABLET BY MOUTH EVERY DAY TAKE WITH OR IMMEDIATELY FOLLOWING A MEAL. What changed:   how much  to take  how to take this  when to take this  additional instructions   mirtazapine 7.5 MG tablet Commonly known as: REMERON Take 3.75 mg by mouth at bedtime.   pantoprazole 40 MG tablet Commonly known as: PROTONIX Take 1 tablet (40 mg total) by mouth daily.   potassium citrate 10 MEQ (1080 MG) SR tablet Commonly known as: UROCIT-K Take 10 mEq by mouth daily.   pravastatin 40 MG tablet Commonly known as: PRAVACHOL TAKE 1 TABLET BY MOUTH EVERY DAY IN THE EVENING What changed:   how much to take  how to take this  when to take this  additional instructions   predniSONE 20 MG tablet Commonly known as: DELTASONE Take 1-2 tablets (20-40 mg total) by mouth daily with breakfast. Take 40mg  daily for 3days then 20mg  daily for 3days then STOP What changed:   how much to take  when to take this  additional instructions   sertraline 25 MG tablet Commonly known as: ZOLOFT Take 25 mg by mouth at bedtime.   Vitamin D (Ergocalciferol) 1.25 MG (50000 UT) Caps capsule Commonly known as: DRISDOL Take 50,000 Units by mouth once a week.            Durable Medical Equipment  (From admission, onward)         Start     Ordered   08/11/19 1540  For home use only DME oxygen  Once    Question Answer Comment  Length of Need Lifetime   Mode or (Route) Nasal cannula   Liters per Minute 2   Frequency Continuous (stationary and portable oxygen unit needed)   Oxygen conserving device Yes   Oxygen delivery system Gas      08/11/19 1539   08/11/19 1358  For home use only DME 3 n 1  Once     08/11/19 1357         Allergies  Allergen Reactions  . Ace Inhibitors Swelling  . Lisinopril Swelling    REACTION: Angioedema  . Valsartan Swelling    REACTION: angioedema.  Pt should not get ACEI or ARBs of any kind due to angioedema.  . Aspirin Other (See Comments)    REACTION: GI ulcer.  . Penicillins     Childhood allergy Has patient had a PCN reaction causing immediate  rash, facial/tongue/throat swelling, SOB or lightheadedness with hypotension: Unknown Has patient had a PCN reaction causing severe rash involving mucus membranes or skin necrosis: Unknown Has patient had a PCN reaction that required hospitalization: Unknown Has patient had a PCN reaction occurring within the last 10 years: Unknown If all of the above answers are "NO", then may proceed with Cephalosporin use.    Follow-up Information    Isaac Bliss, Rayford Halsted, MD. Schedule an appointment as soon as possible for a visit in 1 month(s).   Specialty: Internal Medicine Contact information: [place] [address] [phone]        Laurin Coder, MD. Schedule an appointment as soon as possible for a visit in 1 month(s).   Specialty: Pulmonary Disease Contact information: [place] [address] [place] [address] [phone]        [facility] Follow up.   Specialty: Home Health Services Why: agency will provide home health physical, occupational therapy and nurse. agency will call you to schedule first visit.  Contact information: [address] [place] [address] [phone]            The results of significant diagnostics from this hospitalization (including imaging, microbiology, ancillary and laboratory) are listed below for reference.    Significant Diagnostic Studies: DG Chest 2 View  Result Date: 08/06/2019 CLINICAL DATA:  Shortness of breath and chest tightness. History of chronic lung disease. EXAM: CHEST - 2 VIEW COMPARISON:  06/03/2019 as well as multiple prior x-ray and CT studies. FINDINGS: The heart size is stable. Superimposed on severe fibrotic lung disease are potentially some new infiltrates especially in the mid to lower lung zones bilaterally. No pneumothorax or significant pleural fluid. IMPRESSION: Superimposed on severe fibrotic lung disease are potentially new infiltrates in the mid to lower lung  zones bilaterally. Electronically Signed   By: Aletta Edouard M.D.   On: 08/06/2019 15:54   DG CHEST PORT 1 VIEW  Result Date: 08/08/2019 CLINICAL DATA:  Tachypnea EXAM: PORTABLE CHEST 1 VIEW COMPARISON:  Radiograph 08/06/2019 FINDINGS: Severe fibrotic lung disease is again noted with some diminishing lung volumes and increasing patchy opacities within the infrahilar lungs. Suspect trace effusions versus scarring in costophrenic sulci. Cardiomediastinal silhouette is partially obscured by opacity but is grossly similar accounting for differences technique. No acute osseous or soft tissue abnormality. IMPRESSION: 1. Severe fibrotic lung disease with some diminishing lung volumes and increasing patchy opacities within the infrahilar lungs which could reflect an acute infectious process or edema in the appropriate clinical setting. 2. Trace pleural effusions versus scarring in the costophrenic sulci. Electronically  Signed   By: Lovena Le M.D.   On: 08/08/2019 00:13    Microbiology: Recent Results (from the past 240 hour(s))  Respiratory Panel by RT PCR (Flu A&B, Covid) -     Status: None   Collection Time: 08/06/19  8:23 PM  Result Value Ref Range Status   SARS Coronavirus 2 by RT PCR NEGATIVE NEGATIVE Final   Influenza A by PCR NEGATIVE NEGATIVE Final   Influenza B by PCR NEGATIVE NEGATIVE Final    Comment: Performed at [facility], [place] [address] [place], [place] [address]  Culture, blood (routine x 2) Call MD if unable to obtain prior to antibiotics being given     Status: None   Collection Time: 08/06/19 11:22 PM   Specimen: BLOOD  Result Value Ref Range Status   Specimen Description   Final    BLOOD RIGHT ANTECUBITAL Performed at [facility] [address], [place], [place] [address]    Special Requests   Final    BOTTLES DRAWN AEROBIC AND ANAEROBIC Blood Culture results may not be optimal due to an excessive volume of blood received in  culture bottles Performed at [facility] [address] [place], [place] [address]    Culture   Final    NO GROWTH 5 DAYS Performed at [facility], [place] [address] [place], [place] [address]    Report Status 08/12/2019 FINAL  Final  Culture, blood (routine x 2) Call MD if unable to obtain prior to antibiotics being given     Status: None   Collection Time: 08/06/19 11:26 PM   Specimen: BLOOD  Result Value Ref Range Status   Specimen Description   Final    BLOOD LEFT ANTECUBITAL Performed at [facility] [address] [place], [place] [address]    Special Requests   Final    BOTTLES DRAWN AEROBIC AND ANAEROBIC Blood Culture adequate volume Performed at [facility] [address], [place], [place] [address]    Culture   Final    NO GROWTH 5 DAYS Performed at [facility], [place] [address] [place], [place] [address]    Report Status 08/12/2019 FINAL  Final     Labs: Basic Metabolic Panel: Recent Labs  Lab 08/08/19 0524 08/09/19 0606 08/11/19 0606 08/12/19 0523 08/13/19 0509  NA 136 134* 133* 136 134*  K 2.9* 3.6 3.0* 2.9* 4.2  CL 102 104 90* 88* 90*  CO2 23 24 32 37* 35*  GLUCOSE 136* 128* 99 115* 96  BUN 9 9 13 13 14   CREATININE 0.55 0.49 0.52 0.57 0.58  CALCIUM 7.6* 7.8* 8.3* 8.6* 8.8*   Liver Function Tests: Recent Labs  Lab 08/07/19 0605 08/08/19 0524  AST 21 22  ALT 14 13  ALKPHOS 96 83  BILITOT 0.6 0.5  PROT 5.8* 5.4*  ALBUMIN 2.3* 2.1*   No results for input(s): LIPASE, AMYLASE in the last 168 hours. No results for input(s): AMMONIA in the last 168 hours. CBC: Recent Labs  Lab 08/07/19 0605 08/08/19 0524 08/09/19 0606 08/11/19 0606 08/12/19 0523  WBC 3.7* 3.7* 7.4 9.2 9.3  HGB 9.8* 8.8* 8.8* 10.1* 10.5*  HCT 29.8* 27.4* 28.3* 31.7* 32.3*  MCV 86.1 87.0 89.6 87.8 86.8  PLT 258 271 299 326 398   Cardiac Enzymes: No results for input(s): CKTOTAL, CKMB, CKMBINDEX,  TROPONINI in the last 168 hours. BNP: BNP (last 3 results) Recent Labs  05/31/19 1821  BNP 626.6*    ProBNP (last 3 results) No results for input(s): PROBNP in the last 8760 hours.  CBG: No results for input(s): GLUCAP in the last 168 hours.     Signed:  Domenic Polite MD.  [facility] 08/13/2019, 3:05 PM

## 2019-08-15 ENCOUNTER — Telehealth: Payer: Self-pay | Admitting: Internal Medicine

## 2019-08-15 NOTE — Telephone Encounter (Signed)
Cindy at [facility] calling about pt being discharged from hospital on Sat. And ok to resume on Wednesday the following  Nursing/PT, OT pls cb to verify approval at [phone] (cindy)

## 2019-08-16 ENCOUNTER — Telehealth: Payer: Self-pay

## 2019-08-16 NOTE — Telephone Encounter (Signed)
Verbal orders given to Cindy 

## 2019-08-16 NOTE — Telephone Encounter (Signed)
OK to resume home health services

## 2019-08-16 NOTE — Telephone Encounter (Signed)
Left message for patient to call office to schedule telephone hospital follow up visit with Dr. Jerilee Hoh in 1-2 weeks. She was discharged from [facility] on 08/13/2019.

## 2019-08-18 ENCOUNTER — Telehealth: Payer: Self-pay | Admitting: Internal Medicine

## 2019-08-18 NOTE — Telephone Encounter (Signed)
Message Routed to PCP [facility]

## 2019-08-18 NOTE — Telephone Encounter (Signed)
Home Health Verbal Orders - Caller/Agency: [facility]/ [facility] Number: [phone] Requesting OT/PT/Skilled Nursing/Social Work/Speech Therapy: skilled nursing Frequency: 1 week 1, 2 week 7, 1 week 3 for COPD teaching and oxygen teaching  RN would also like to report pt has extremely dry cracked heels that she recommended pt put 40% zinc on. Pt has also not had a bowel movement since Sunday 1/3, and RN recommended pt take muralax to assist. Please advise.

## 2019-08-18 NOTE — Telephone Encounter (Signed)
Home Health Verbal Orders -[facility] Caller/Agency: [facility] Number: [phone] Requesting OT/PT/Skilled Nursing/Social Work/Speech Therapy: OT Frequency: 1x1 2x1 1x1

## 2019-08-18 NOTE — Telephone Encounter (Signed)
Message Routed to PCP CMA 

## 2019-08-19 ENCOUNTER — Telehealth: Payer: Self-pay | Admitting: Internal Medicine

## 2019-08-19 NOTE — Telephone Encounter (Signed)
Left message on machine with verbal orders for PT 

## 2019-08-19 NOTE — Telephone Encounter (Signed)
Ok to order 

## 2019-08-19 NOTE — Telephone Encounter (Signed)
Left message on machine for [name] with verbal orders for PT and OT.

## 2019-08-19 NOTE — Telephone Encounter (Signed)
Copied from [place] [id number]. Topic: General - Other >> Aug 19, 2019  8:43 AM Keene Breath wrote: Reason for [id number] to get verbal orders for home health PT - 1xwk1, 2wk4, 1xwk4.  CB# [phone]

## 2019-08-19 NOTE — Telephone Encounter (Signed)
Message Routed to PCP CMA 

## 2019-08-22 ENCOUNTER — Telehealth: Payer: Self-pay | Admitting: Internal Medicine

## 2019-08-24 ENCOUNTER — Telehealth: Payer: Self-pay | Admitting: Internal Medicine

## 2019-08-24 NOTE — Telephone Encounter (Signed)
FYI

## 2019-08-24 NOTE — Telephone Encounter (Signed)
Home Health Verbal Orders - Caller/Agency: [facility] Callback Number: [phone]  Requesting OT/PT/Skilled Nursing/Social Work/Speech Therapy: He wanted to reported missed visit , grandson live in home and test positive for covid and will have to hold off for now

## 2019-08-27 ENCOUNTER — Other Ambulatory Visit: Payer: Self-pay

## 2019-08-27 ENCOUNTER — Emergency Department (HOSPITAL_COMMUNITY): Payer: Medicare Other

## 2019-08-27 ENCOUNTER — Inpatient Hospital Stay (HOSPITAL_COMMUNITY)
Admission: EM | Admit: 2019-08-27 | Discharge: 2019-08-30 | DRG: 190 | Disposition: A | Discharge status: Home Health | Payer: Medicare Other | Attending: Internal Medicine | Admitting: Internal Medicine

## 2019-08-27 DIAGNOSIS — K219 Gastro-esophageal reflux disease without esophagitis: Secondary | ICD-10-CM | POA: Diagnosis present

## 2019-08-27 DIAGNOSIS — Z833 Family history of diabetes mellitus: Secondary | ICD-10-CM

## 2019-08-27 DIAGNOSIS — L89152 Pressure ulcer of sacral region, stage 2: Secondary | ICD-10-CM | POA: Diagnosis present

## 2019-08-27 DIAGNOSIS — J439 Emphysema, unspecified: Secondary | ICD-10-CM | POA: Diagnosis not present

## 2019-08-27 DIAGNOSIS — F419 Anxiety disorder, unspecified: Secondary | ICD-10-CM | POA: Diagnosis present

## 2019-08-27 DIAGNOSIS — Z20822 Contact with and (suspected) exposure to covid-19: Secondary | ICD-10-CM | POA: Diagnosis present

## 2019-08-27 DIAGNOSIS — Z825 Family history of asthma and other chronic lower respiratory diseases: Secondary | ICD-10-CM

## 2019-08-27 DIAGNOSIS — Z888 Allergy status to other drugs, medicaments and biological substances status: Secondary | ICD-10-CM

## 2019-08-27 DIAGNOSIS — R0602 Shortness of breath: Secondary | ICD-10-CM

## 2019-08-27 DIAGNOSIS — I272 Pulmonary hypertension, unspecified: Secondary | ICD-10-CM | POA: Diagnosis present

## 2019-08-27 DIAGNOSIS — Z9981 Dependence on supplemental oxygen: Secondary | ICD-10-CM

## 2019-08-27 DIAGNOSIS — J8410 Pulmonary fibrosis, unspecified: Secondary | ICD-10-CM | POA: Diagnosis present

## 2019-08-27 DIAGNOSIS — I10 Essential (primary) hypertension: Secondary | ICD-10-CM | POA: Diagnosis present

## 2019-08-27 DIAGNOSIS — D860 Sarcoidosis of lung: Secondary | ICD-10-CM | POA: Diagnosis present

## 2019-08-27 DIAGNOSIS — J441 Chronic obstructive pulmonary disease with (acute) exacerbation: Secondary | ICD-10-CM | POA: Diagnosis not present

## 2019-08-27 DIAGNOSIS — Z96641 Presence of right artificial hip joint: Secondary | ICD-10-CM | POA: Diagnosis present

## 2019-08-27 DIAGNOSIS — Z886 Allergy status to analgesic agent status: Secondary | ICD-10-CM

## 2019-08-27 DIAGNOSIS — Z8249 Family history of ischemic heart disease and other diseases of the circulatory system: Secondary | ICD-10-CM

## 2019-08-27 DIAGNOSIS — Z79899 Other long term (current) drug therapy: Secondary | ICD-10-CM

## 2019-08-27 DIAGNOSIS — Z880 Allergy status to penicillin: Secondary | ICD-10-CM

## 2019-08-27 DIAGNOSIS — R0609 Other forms of dyspnea: Secondary | ICD-10-CM

## 2019-08-27 DIAGNOSIS — M810 Age-related osteoporosis without current pathological fracture: Secondary | ICD-10-CM | POA: Diagnosis present

## 2019-08-27 DIAGNOSIS — F329 Major depressive disorder, single episode, unspecified: Secondary | ICD-10-CM | POA: Diagnosis present

## 2019-08-27 DIAGNOSIS — J9621 Acute and chronic respiratory failure with hypoxia: Secondary | ICD-10-CM | POA: Diagnosis present

## 2019-08-27 DIAGNOSIS — R2681 Unsteadiness on feet: Secondary | ICD-10-CM | POA: Diagnosis present

## 2019-08-27 DIAGNOSIS — Z823 Family history of stroke: Secondary | ICD-10-CM

## 2019-08-27 DIAGNOSIS — Z87891 Personal history of nicotine dependence: Secondary | ICD-10-CM

## 2019-08-27 LAB — COMPREHENSIVE METABOLIC PANEL
ALT: 22 U/L (ref 0–44)
AST: 23 U/L (ref 15–41)
Albumin: 2.4 g/dL — ABNORMAL LOW (ref 3.5–5.0)
Alkaline Phosphatase: 101 U/L (ref 38–126)
Anion gap: 9 (ref 5–15)
BUN: 13 mg/dL (ref 8–23)
CO2: 29 mmol/L (ref 22–32)
Calcium: 8.4 mg/dL — ABNORMAL LOW (ref 8.9–10.3)
Chloride: 94 mmol/L — ABNORMAL LOW (ref 98–111)
Creatinine, Ser: 0.63 mg/dL (ref 0.44–1.00)
GFR calc Af Amer: >60 mL/min (ref >60)
GFR calc non Af Amer: >60 mL/min (ref >60)
Glucose, Bld: 80 mg/dL (ref 70–99)
Potassium: 4 mmol/L (ref 3.5–5.1)
Sodium: 132 mmol/L — ABNORMAL LOW (ref 135–145)
Total Bilirubin: 0.8 mg/dL (ref 0.3–1.2)
Total Protein: 5.5 g/dL — ABNORMAL LOW (ref 6.5–8.1)

## 2019-08-27 LAB — CBC WITH DIFFERENTIAL/PLATELET
Abs Immature Granulocytes: 0.02 10*3/uL (ref 0.00–0.07)
Basophils Absolute: 0 10*3/uL (ref 0.0–0.1)
Basophils Relative: 0 %
Eosinophils Absolute: 0.4 10*3/uL (ref 0.0–0.5)
Eosinophils Relative: 6 %
HCT: 27.9 % — ABNORMAL LOW (ref 36.0–46.0)
Hemoglobin: 9.2 g/dL — ABNORMAL LOW (ref 12.0–15.0)
Immature Granulocytes: 0 %
Lymphocytes Relative: 19 %
Lymphs Abs: 1.1 10*3/uL (ref 0.7–4.0)
MCH: 28 pg (ref 26.0–34.0)
MCHC: 33 g/dL (ref 30.0–36.0)
MCV: 85.1 fL (ref 80.0–100.0)
Monocytes Absolute: 0.5 10*3/uL (ref 0.1–1.0)
Monocytes Relative: 8 %
Neutro Abs: 3.8 10*3/uL (ref 1.7–7.7)
Neutrophils Relative %: 67 %
Platelets: 116 10*3/uL — ABNORMAL LOW (ref 150–400)
RBC: 3.28 MIL/uL — ABNORMAL LOW (ref 3.87–5.11)
RDW: 17.8 % — ABNORMAL HIGH (ref 11.5–15.5)
WBC: 5.7 10*3/uL (ref 4.0–10.5)
nRBC: 0 % (ref 0.0–0.2)

## 2019-08-27 LAB — BLOOD GAS, VENOUS
Acid-Base Excess: 7 mmol/L — ABNORMAL HIGH (ref 0.0–2.0)
Bicarbonate: 31.4 mmol/L — ABNORMAL HIGH (ref 20.0–28.0)
O2 Saturation: 93.9 %
Patient temperature: 98.6
pCO2, Ven: 46.2 mmHg (ref 44.0–60.0)
pH, Ven: 7.447 — ABNORMAL HIGH (ref 7.250–7.430)
pO2, Ven: 65.6 mmHg — ABNORMAL HIGH (ref 32.0–45.0)

## 2019-08-27 LAB — POC SARS CORONAVIRUS 2 AG -  ED: SARS Coronavirus 2 Ag: NEGATIVE

## 2019-08-27 LAB — BRAIN NATRIURETIC PEPTIDE: B Natriuretic Peptide: 870 pg/mL — ABNORMAL HIGH (ref 0.0–100.0)

## 2019-08-27 LAB — MAGNESIUM: Magnesium: 1.7 mg/dL (ref 1.7–2.4)

## 2019-08-27 LAB — TROPONIN I (HIGH SENSITIVITY): Troponin I (High Sensitivity): 15 ng/L (ref <18)

## 2019-08-27 MED ORDER — ALBUTEROL SULFATE HFA 108 (90 BASE) MCG/ACT IN AERS
2.0000 | INHALATION_SPRAY | Freq: Once | RESPIRATORY_TRACT | Status: AC
  Administered 2019-08-27: 22:00:00 2 via RESPIRATORY_TRACT

## 2019-08-27 MED ORDER — METHYLPREDNISOLONE SODIUM SUCC 125 MG IJ SOLR
80.0000 mg | Freq: Once | INTRAMUSCULAR | Status: AC
  Administered 2019-08-27: 80 mg via INTRAVENOUS
  Filled 2019-08-27: qty 2

## 2019-08-27 MED ORDER — ENOXAPARIN SODIUM 40 MG/0.4ML ~~LOC~~ SOLN
40.0000 mg | Freq: Every day | SUBCUTANEOUS | Status: DC
  Administered 2019-08-28 – 2019-08-29 (×3): 40 mg via SUBCUTANEOUS
  Filled 2019-08-27 (×3): qty 0.4

## 2019-08-27 MED ORDER — ALBUTEROL SULFATE HFA 108 (90 BASE) MCG/ACT IN AERS
2.0000 | INHALATION_SPRAY | Freq: Once | RESPIRATORY_TRACT | Status: AC
  Administered 2019-08-27: 2 via RESPIRATORY_TRACT
  Filled 2019-08-27: qty 6.7

## 2019-08-27 MED ORDER — DOXYCYCLINE HYCLATE 100 MG PO TABS
100.0000 mg | ORAL_TABLET | Freq: Two times a day (BID) | ORAL | Status: DC
  Administered 2019-08-28: 100 mg via ORAL
  Filled 2019-08-27: qty 1

## 2019-08-27 MED ORDER — AEROCHAMBER PLUS FLO-VU MEDIUM MISC
1.0000 | Freq: Once | Status: AC
  Administered 2019-08-27: 1
  Filled 2019-08-27: qty 1

## 2019-08-27 NOTE — ED Provider Notes (Signed)
Medical screening examination/treatment/procedure(s) were conducted as a shared visit with non-physician practitioner(s) and myself.  I personally evaluated the patient during the encounter.  EKG Interpretation  Date/Time:  Saturday August 27 2019 17:59:47 EST Ventricular Rate:  87 PR Interval:    QRS Duration: 93 QT Interval:  398 QTC Calculation: 479 R Axis:   21 Text Interpretation: Sinus rhythm Borderline low voltage, extremity leads Confirmed by Virgel Manifold [phone]) on 08/27/2019 8:77:93 PM  74 year old female with dyspnea.  She was recently discharged from the hospital on 2 L nasal cannula.  Over the last 2 to 3 days she has had increasing dyspnea.  Now feels dyspneic at rest and certainly worse with minimal activity.  Imaging as below.  She has very severe underlying lung disease.  I suspect her symptoms are more related to her underlying sarcoidosis/COPD.  I feel that infectious process is less likely.  Antibiotics deferred at this time.  Bronchodilators.  Steroids.  DG Chest 2 View  Result Date: 08/06/2019 CLINICAL DATA:  Shortness of breath and chest tightness. History of chronic lung disease. EXAM: CHEST - 2 VIEW COMPARISON:  06/03/2019 as well as multiple prior x-ray and CT studies. FINDINGS: The heart size is stable. Superimposed on severe fibrotic lung disease are potentially some new infiltrates especially in the mid to lower lung zones bilaterally. No pneumothorax or significant pleural fluid. IMPRESSION: Superimposed on severe fibrotic lung disease are potentially new infiltrates in the mid to lower lung zones bilaterally. Electronically Signed   By: Aletta Edouard M.D.   On: 08/06/2019 15:54   DG Chest Port 1 View  Result Date: 08/27/2019 CLINICAL DATA:  Shortness of breath. EXAM: PORTABLE CHEST 1 VIEW COMPARISON:  Radiograph 08/07/2019. CT 07/02/2018 FINDINGS: Severe chronic lung disease with multifocal scarring and bilateral hilar retraction. Slight progression in patchy  opacity in the infrahilar regions since prior exam. Heart is normal in size with unchanged mediastinal contours. Calcified mediastinal and hilar lymph nodes consistent with sarcoidosis. Blunting of the costophrenic angles, slight progression on the left, small pleural effusions versus scarring. No pneumothorax. No acute osseous abnormalities are seen. IMPRESSION: 1. Slight progression in patchy infrahilar opacities since prior exam, may be atelectasis, pneumonia or less likely pulmonary edema. 2. Blunting of the costophrenic angles may represent small pleural effusions or lung base scarring. 3. Background chronic lung disease with multifocal scarring, fibrosis, and calcified mediastinal nodes consistent with advanced sarcoidosis. Electronically Signed   By: Keith Rake M.D.   On: 08/27/2019 19:12   DG CHEST PORT 1 VIEW  Result Date: 08/08/2019 CLINICAL DATA:  Tachypnea EXAM: PORTABLE CHEST 1 VIEW COMPARISON:  Radiograph 08/06/2019 FINDINGS: Severe fibrotic lung disease is again noted with some diminishing lung volumes and increasing patchy opacities within the infrahilar lungs. Suspect trace effusions versus scarring in costophrenic sulci. Cardiomediastinal silhouette is partially obscured by opacity but is grossly similar accounting for differences technique. No acute osseous or soft tissue abnormality. IMPRESSION: 1. Severe fibrotic lung disease with some diminishing lung volumes and increasing patchy opacities within the infrahilar lungs which could reflect an acute infectious process or edema in the appropriate clinical setting. 2. Trace pleural effusions versus scarring in the costophrenic sulci. Electronically Signed   By: Lovena Le M.D.   On: 08/08/2019 00:13      Virgel Manifold, MD 08/28/19 [phone]

## 2019-08-27 NOTE — ED Provider Notes (Signed)
COPD, d/ch 2 weeks ago Increased DOE since discharge CXR equivocal Wheezing Feel COPD over infection Getting nebs, steroids On 2 L home O2  Intro and recheck of patient:  She is in NAD while at rest. It is difficult for her to finish a full sentence secondary to dyspnea. No pain currently.   She reports to me that she was discharged home 3 weeks ago with oxygen, which was new at that time. Over the last several days, she has increased her O2 to 3L and ultimately to 5L with temporary relief. She also reports they ran out of home oxygen tonight. No hypoxia.  Discussed admission with Dr. Humphrey Rolls who accepts the patient on to his service.     Charlann Lange, PA-C 08/27/19 2307    Virgel Manifold, MD 08/28/19 [phone]

## 2019-08-27 NOTE — ED Provider Notes (Signed)
[facility] DEPT Provider Note   CSN: [id number] Arrival date & time: 08/27/19  1742     History Chief Complaint  Patient presents with  . Shortness of Breath    [name] is a 74 y.o. female with a past medical history of sarcoidosis, COPD, pulmonary hypertension, who presents today for evaluation of worsening shortness of breath. She was recently discharged from the hospital on 2 L nasal cannula oxygen after being admitted for shortness of breath.  She states that since being discharged her overall condition has worsened.  Initially she states she was able to go to the kitchen however now she gets too short of breath with exertion to walk that far and has been getting more and more short of breath.  There also reportedly issues with home oxygen supply and a piece possibly being broken.    Daughter reports that that she was having a hard time breathing over the past 3 days.  She has noted her mom will occasionally be clutching at her chest with a panicked look in her eye struggling to breathe.  She does report that a family member had Covid however she does not think that that family member has been around the patient.  She has had 4 albuterol nebs today with out relief.   HPI     Past Medical History:  Diagnosis Date  . Blind left eye   . Cataract of both eyes 01/18/2009   Right surgically repaired  . 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  . Depression 06/08/2007  . Essential hypertension, benign 06/08/2007  . GERD 01/18/2009  . Osteoporosis 12/31/2012   Left femur had T score -2.5 on DEXA scan 02/26/2009.   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 Sarcoidosis 02/05/2009   PFTs 03/30/2009: FVC 67%, FEV1 48%, FEV1/FVC 52%, TLC 104%, DLCO 71%.    Patient Active Problem List   Diagnosis Date Noted  . CAP (community acquired pneumonia) 08/06/2019  . Acute  respiratory failure with hypoxia ([place])   . Shock ([place])   . Central venous catheter in place   . AKI (acute kidney injury) ([place])   . Prolonged QT interval   . Pulmonary hypertension ([place])   . Hyponatremia   . Hypoglycemia   . Encephalopathy acute   . Severe sepsis ([place]) 05/31/2019  . Low bone mass 05/24/2018  . IBS (irritable bowel syndrome) 03/01/2018  . Diarrhea 12/22/2017  . Falls 12/26/2015  . Mixed incontinence urge and stress 12/26/2015  . Hypokalemia 10/03/2015  . Encounter for central line placement 09/12/2014  . Dyslipidemia 12/31/2012  . Allergic rhinitis 12/31/2012  . Osteoarthritis, hip, bilateral 11/01/2010  . Unintentional weight loss 03/19/2009  . 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 Sarcoidosis 02/05/2009  . GERD 01/18/2009  . Essential hypertension, benign 06/08/2007    Past Surgical History:  Procedure Laterality Date  . CATARACT EXTRACTION Right   . COLONOSCOPY  2002  . HIP ARTHROPLASTY Right   . TUBAL LIGATION       OB History   No obstetric history on file.     Family History  Problem Relation Age of Onset  . Cancer Mother   . Heart disease Mother   . Breast cancer Mother   . Stroke Father   . Cancer Sister   . Heart disease Sister   . Asthma Daughter   . Cancer  Maternal Aunt   . Cancer Other   . Diabetes Other   . Hypertension Other   . Colon cancer Neg Hx     Social History   Tobacco Use  . Smoking status: Former Smoker    Packs/day: 0.25    Years: 30.00    Pack years: 7.50    Quit date: 05/11/2010    Years since quitting: 9.3  . Smokeless tobacco: Never Used  Substance Use Topics  . Alcohol use: Yes    Alcohol/week: 0.0 standard drinks    Comment: wine cooler.  . Drug use: No    Home Medications Prior to Admission medications   Medication Sig Start Date End Date Taking? Authorizing Provider  albuterol (PROVENTIL) (2.5 MG/3ML) 0.083% nebulizer solution Take 3 mLs (2.5 mg total) by nebulization every 6 (six) hours as  needed. As needed for SOB 04/15/19  Yes Isaac Bliss, Rayford Halsted, MD  albuterol (VENTOLIN HFA) 108 (90 Base) MCG/ACT inhaler Inhale 1-2 puffs into the lungs every 6 (six) hours as needed for wheezing or shortness of breath. 04/15/19 04/14/20 Yes Erline Hau, MD  amLODipine (NORVASC) 5 MG tablet Take 1 tablet (5 mg total) by mouth daily. 02/22/19 02/22/20 Yes Erline Hau, MD  gabapentin (NEURONTIN) 100 MG capsule Take 100 mg by mouth at bedtime.   Yes [provider]  hydrochlorothiazide (HYDRODIURIL) 25 MG tablet Take 25 mg by mouth daily.   Yes [provider]  HYDROcodone-acetaminophen (NORCO/VICODIN) 5-325 MG tablet Take 1 tablet by mouth every 6 (six) hours as needed for moderate pain.   Yes [provider]  ipratropium-albuterol (DUONEB) 0.5-2.5 (3) MG/3ML SOLN USE 3 ML VIA NEBULIZER EVERY 4 HOURS AS NEEDED Patient taking differently: Take 3 mLs by nebulization every 4 (four) hours as needed (sob).  06/18/18  Yes Olalere, Adewale A, MD  Magnesium 500 MG TABS Take 500 mg by mouth at bedtime.   Yes [provider]  metoprolol succinate (TOPROL-XL) 50 MG 24 hr tablet TAKE 1 TABLET BY MOUTH EVERY DAY TAKE WITH OR IMMEDIATELY FOLLOWING A MEAL. Patient taking differently: Take 50 mg by mouth daily.  06/14/19  Yes Isaac Bliss, Rayford Halsted, MD  mirtazapine (REMERON) 7.5 MG tablet Take 3.75 mg by mouth at bedtime. 07/20/19  Yes [provider]  pantoprazole (PROTONIX) 40 MG tablet Take 1 tablet (40 mg total) by mouth daily. 02/22/19  Yes Isaac Bliss, Rayford Halsted, MD  potassium citrate (UROCIT-K) 10 MEQ (1080 MG) SR tablet Take 10 mEq by mouth daily.   Yes [provider]  pravastatin (PRAVACHOL) 40 MG tablet TAKE 1 TABLET BY MOUTH EVERY DAY IN THE EVENING Patient taking differently: Take 40 mg by mouth at bedtime.  02/22/19  Yes Isaac Bliss, Rayford Halsted, MD  sertraline (ZOLOFT) 25 MG tablet Take 25 mg by mouth at bedtime.  07/19/19  Yes [provider]  Vitamin D, Ergocalciferol, (DRISDOL) 1.25 MG (50000 UT) CAPS capsule Take 50,000 Units by mouth once a week. 05/06/19  Yes [provider]  predniSONE (DELTASONE) 20 MG tablet Take 1-2 tablets (20-40 mg total) by mouth daily with breakfast. Take 40mg  daily for 3days then 20mg  daily for 3days then STOP Patient not taking: Reported on 08/27/2019 08/13/19   Domenic Polite, MD    Allergies    Ace inhibitors, Lisinopril, Valsartan, Aspirin, and Penicillins  Review of Systems   Review of Systems  Constitutional: Negative for chills and fever.  HENT: Negative for congestion.   Respiratory:  Positive for cough, chest tightness, shortness of breath and wheezing.   Cardiovascular: Negative for chest pain, palpitations and leg swelling.  Gastrointestinal: Negative for abdominal pain, nausea and vomiting.  Genitourinary: Negative for dysuria.  Musculoskeletal: Negative for back pain and neck pain.  Skin: Negative for color change, rash and wound.  Neurological: Positive for weakness (Generalized). Negative for headaches.  All other systems reviewed and are negative.   Physical Exam Updated Vital Signs BP 119/67   Pulse 86   Temp 98.5 F (36.9 C) (Oral)   Resp 16   SpO2 96%   Physical Exam Vitals and nursing note reviewed.  Constitutional:      General: She is not in acute distress.    Appearance: She is well-developed. She is ill-appearing.  HENT:     Head: Normocephalic and atraumatic.     Mouth/Throat:     Mouth: Mucous membranes are moist.  Eyes:     Conjunctiva/sclera: Conjunctivae normal.  Neck:     Thyroid: No thyromegaly.  Cardiovascular:     Rate and Rhythm: Normal rate and regular rhythm.     Heart sounds: No murmur.  Pulmonary:     Effort: Tachypnea present. No respiratory distress.     Breath sounds: Examination of the right-upper field reveals decreased breath sounds and wheezing. Examination of the left-upper field reveals  decreased breath sounds and wheezing. Examination of the right-middle field reveals decreased breath sounds. Examination of the left-middle field reveals decreased breath sounds. Examination of the right-lower field reveals decreased breath sounds. Examination of the left-lower field reveals decreased breath sounds. Decreased breath sounds and wheezing present.  Chest:     Chest wall: No deformity or tenderness.  Abdominal:     General: Bowel sounds are normal.     Palpations: Abdomen is soft. There is no mass.     Tenderness: There is no abdominal tenderness. There is no guarding.  Musculoskeletal:     Cervical back: Normal range of motion and neck supple.     Right lower leg: No tenderness. No edema.     Left lower leg: No tenderness. No edema.  Skin:    General: Skin is warm and dry.  Neurological:     Mental Status: She is alert.     Comments: Speech is not slurred.  Facial movements are symmetrical.   Psychiatric:        Mood and Affect: Mood is anxious.        Behavior: Behavior normal.     ED Results / Procedures / Treatments   Labs (all labs ordered are listed, but only abnormal results are displayed) Labs Reviewed  COMPREHENSIVE METABOLIC PANEL  CBC WITH DIFFERENTIAL/PLATELET  BRAIN NATRIURETIC PEPTIDE  BLOOD GAS, VENOUS  MAGNESIUM  POC SARS CORONAVIRUS 2 AG -  ED  TROPONIN I (HIGH SENSITIVITY)  TROPONIN I (HIGH SENSITIVITY)    EKG EKG Interpretation  Date/Time:  Saturday August 27 2019 17:59:47 EST Ventricular Rate:  87 PR Interval:    QRS Duration: 93 QT Interval:  398 QTC Calculation: 479 R Axis:   21 Text Interpretation: Sinus rhythm Borderline low voltage, extremity leads Confirmed by Virgel Manifold [phone]) on 08/27/2019 8:43:13 PM   Radiology DG Chest Port 1 View  Result Date: 08/27/2019 CLINICAL DATA:  Shortness of breath. EXAM: PORTABLE CHEST 1 VIEW COMPARISON:  Radiograph 08/07/2019. CT 07/02/2018 FINDINGS: Severe chronic lung disease with  multifocal scarring and bilateral hilar retraction. Slight progression in patchy opacity in the infrahilar regions since prior  exam. Heart is normal in size with unchanged mediastinal contours. Calcified mediastinal and hilar lymph nodes consistent with sarcoidosis. Blunting of the costophrenic angles, slight progression on the left, small pleural effusions versus scarring. No pneumothorax. No acute osseous abnormalities are seen. IMPRESSION: 1. Slight progression in patchy infrahilar opacities since prior exam, may be atelectasis, pneumonia or less likely pulmonary edema. 2. Blunting of the costophrenic angles may represent small pleural effusions or lung base scarring. 3. Background chronic lung disease with multifocal scarring, fibrosis, and calcified mediastinal nodes consistent with advanced sarcoidosis. Electronically Signed   By: Keith Rake M.D.   On: 08/27/2019 19:12    Procedures Procedures (including critical care time)  Medications Ordered in ED Medications  albuterol (VENTOLIN HFA) 108 (90 Base) MCG/ACT inhaler 2 puff (2 puffs Inhalation Given 08/27/19 2047)  AeroChamber Plus Flo-Vu Medium MISC 1 each (1 each Other Given 08/27/19 2047)    ED Course  I have reviewed the triage vital signs and the nursing notes.  Pertinent labs & imaging results that were available during my care of the patient were reviewed by me and considered in my medical decision making (see chart for details).    MDM Rules/Calculators/A&P                      Patient presents today for evaluation of shortness of breath.  She was initially reported as having a medical equipment malfunction, however on history it appears that this is as she and her daughter have been increasing the oxygen concentrator beyond what it is meant to do causing her to frequently overheat due to her shortness of breath.  On exam she has decreased bilateral breath sounds with faint and expiratory wheezes.  She is satting in the mid  to high 90s on her normal 2 L.  She does appear slightly anxious, questioning "how am I getting oxygen if the bags are not up" indicating fluid bags.    Labs were ordered.  CXR obtained showing worsening opacities bilaterally.  EKG without evidence of ischemia.  Albuterol was ordered.  She is not reportedly had a direct Covid exposure she has a grandson who tested positive for Covid and has been around people who had been around him despite testing negative.  At shift change care was transferred to Dr. Wilson Singer  who will follow pending studies, re-evaulate and determine disposition.     Final Clinical Impression(s) / ED Diagnoses Final diagnoses:  Shortness of breath    Rx / DC Orders ED Discharge Orders    None       Ollen Gross 08/27/19 2053    Virgel Manifold, MD 08/28/19 [phone]

## 2019-08-27 NOTE — ED Triage Notes (Signed)
Pt BIB [facility] from home. Pt was discharged from the hospital after suffering from pneumonia. Pt is now supposed to be on 2 L [place] at all times. [facility] brought the supplies to the house. However family is unsure of how to use equipment and a piece is broken. Pt anxious about situation. Pt calm at this time. No medical complaints.

## 2019-08-28 ENCOUNTER — Encounter (HOSPITAL_COMMUNITY): Payer: Self-pay

## 2019-08-28 ENCOUNTER — Observation Stay (HOSPITAL_COMMUNITY): Payer: Medicare Other

## 2019-08-28 DIAGNOSIS — F419 Anxiety disorder, unspecified: Secondary | ICD-10-CM | POA: Diagnosis present

## 2019-08-28 DIAGNOSIS — Z888 Allergy status to other drugs, medicaments and biological substances status: Secondary | ICD-10-CM | POA: Diagnosis not present

## 2019-08-28 DIAGNOSIS — Z87891 Personal history of nicotine dependence: Secondary | ICD-10-CM | POA: Diagnosis not present

## 2019-08-28 DIAGNOSIS — J8410 Pulmonary fibrosis, unspecified: Secondary | ICD-10-CM | POA: Diagnosis present

## 2019-08-28 DIAGNOSIS — Z880 Allergy status to penicillin: Secondary | ICD-10-CM | POA: Diagnosis not present

## 2019-08-28 DIAGNOSIS — J441 Chronic obstructive pulmonary disease with (acute) exacerbation: Secondary | ICD-10-CM

## 2019-08-28 DIAGNOSIS — D860 Sarcoidosis of lung: Secondary | ICD-10-CM | POA: Diagnosis present

## 2019-08-28 DIAGNOSIS — I272 Pulmonary hypertension, unspecified: Secondary | ICD-10-CM | POA: Diagnosis present

## 2019-08-28 DIAGNOSIS — M810 Age-related osteoporosis without current pathological fracture: Secondary | ICD-10-CM | POA: Diagnosis present

## 2019-08-28 DIAGNOSIS — J439 Emphysema, unspecified: Secondary | ICD-10-CM | POA: Diagnosis present

## 2019-08-28 DIAGNOSIS — Z9981 Dependence on supplemental oxygen: Secondary | ICD-10-CM | POA: Diagnosis not present

## 2019-08-28 DIAGNOSIS — Z825 Family history of asthma and other chronic lower respiratory diseases: Secondary | ICD-10-CM | POA: Diagnosis not present

## 2019-08-28 DIAGNOSIS — Z886 Allergy status to analgesic agent status: Secondary | ICD-10-CM | POA: Diagnosis not present

## 2019-08-28 DIAGNOSIS — J9621 Acute and chronic respiratory failure with hypoxia: Secondary | ICD-10-CM | POA: Diagnosis present

## 2019-08-28 DIAGNOSIS — Z96641 Presence of right artificial hip joint: Secondary | ICD-10-CM | POA: Diagnosis present

## 2019-08-28 DIAGNOSIS — K219 Gastro-esophageal reflux disease without esophagitis: Secondary | ICD-10-CM | POA: Diagnosis present

## 2019-08-28 DIAGNOSIS — Z8249 Family history of ischemic heart disease and other diseases of the circulatory system: Secondary | ICD-10-CM | POA: Diagnosis not present

## 2019-08-28 DIAGNOSIS — I10 Essential (primary) hypertension: Secondary | ICD-10-CM | POA: Diagnosis present

## 2019-08-28 DIAGNOSIS — L89152 Pressure ulcer of sacral region, stage 2: Secondary | ICD-10-CM | POA: Diagnosis present

## 2019-08-28 DIAGNOSIS — Z833 Family history of diabetes mellitus: Secondary | ICD-10-CM | POA: Diagnosis not present

## 2019-08-28 DIAGNOSIS — Z823 Family history of stroke: Secondary | ICD-10-CM | POA: Diagnosis not present

## 2019-08-28 DIAGNOSIS — Z79899 Other long term (current) drug therapy: Secondary | ICD-10-CM | POA: Diagnosis not present

## 2019-08-28 DIAGNOSIS — F329 Major depressive disorder, single episode, unspecified: Secondary | ICD-10-CM | POA: Diagnosis present

## 2019-08-28 DIAGNOSIS — R2681 Unsteadiness on feet: Secondary | ICD-10-CM | POA: Diagnosis present

## 2019-08-28 DIAGNOSIS — Z20822 Contact with and (suspected) exposure to covid-19: Secondary | ICD-10-CM | POA: Diagnosis present

## 2019-08-28 LAB — CBC
HCT: 29.6 % — ABNORMAL LOW (ref 36.0–46.0)
Hemoglobin: 9.6 g/dL — ABNORMAL LOW (ref 12.0–15.0)
MCH: 27.7 pg (ref 26.0–34.0)
MCHC: 32.4 g/dL (ref 30.0–36.0)
MCV: 85.3 fL (ref 80.0–100.0)
Platelets: 138 10*3/uL — ABNORMAL LOW (ref 150–400)
RBC: 3.47 MIL/uL — ABNORMAL LOW (ref 3.87–5.11)
RDW: 18 % — ABNORMAL HIGH (ref 11.5–15.5)
WBC: 6.3 10*3/uL (ref 4.0–10.5)
nRBC: 0 % (ref 0.0–0.2)

## 2019-08-28 LAB — CREATININE, SERUM
Creatinine, Ser: 0.79 mg/dL (ref 0.44–1.00)
GFR calc Af Amer: >60 mL/min (ref >60)
GFR calc non Af Amer: >60 mL/min (ref >60)

## 2019-08-28 LAB — HIV ANTIBODY (ROUTINE TESTING W REFLEX): HIV Screen 4th Generation wRfx: NONREACTIVE

## 2019-08-28 LAB — SARS CORONAVIRUS 2 (TAT 6-24 HRS): SARS Coronavirus 2: NEGATIVE

## 2019-08-28 MED ORDER — METHYLPREDNISOLONE SODIUM SUCC 40 MG IJ SOLR
40.0000 mg | Freq: Four times a day (QID) | INTRAMUSCULAR | Status: DC
  Administered 2019-08-28 – 2019-08-30 (×9): 40 mg via INTRAVENOUS
  Filled 2019-08-28 (×9): qty 1

## 2019-08-28 MED ORDER — PANTOPRAZOLE SODIUM 40 MG PO TBEC
40.0000 mg | DELAYED_RELEASE_TABLET | Freq: Every day | ORAL | Status: DC
  Administered 2019-08-28 – 2019-08-30 (×3): 40 mg via ORAL
  Filled 2019-08-28 (×3): qty 1

## 2019-08-28 MED ORDER — GABAPENTIN 100 MG PO CAPS
100.0000 mg | ORAL_CAPSULE | Freq: Every day | ORAL | Status: DC
  Administered 2019-08-28 – 2019-08-29 (×2): 100 mg via ORAL
  Filled 2019-08-28 (×2): qty 1

## 2019-08-28 MED ORDER — SERTRALINE HCL 25 MG PO TABS
25.0000 mg | ORAL_TABLET | Freq: Every day | ORAL | Status: DC
  Administered 2019-08-28 – 2019-08-29 (×2): 25 mg via ORAL
  Filled 2019-08-28 (×2): qty 1

## 2019-08-28 MED ORDER — SODIUM CHLORIDE (PF) 0.9 % IJ SOLN
INTRAMUSCULAR | Status: AC
  Filled 2019-08-28: qty 50

## 2019-08-28 MED ORDER — MIRTAZAPINE 7.5 MG PO TABS
3.7500 mg | ORAL_TABLET | Freq: Every day | ORAL | Status: DC
  Administered 2019-08-28 – 2019-08-29 (×2): 3.75 mg via ORAL
  Filled 2019-08-28 (×3): qty 1

## 2019-08-28 MED ORDER — ALBUTEROL SULFATE (2.5 MG/3ML) 0.083% IN NEBU
2.5000 mg | INHALATION_SOLUTION | Freq: Four times a day (QID) | RESPIRATORY_TRACT | Status: DC | PRN
  Administered 2019-08-28 – 2019-08-29 (×2): 2.5 mg via RESPIRATORY_TRACT
  Filled 2019-08-28 (×2): qty 3

## 2019-08-28 MED ORDER — IOHEXOL 300 MG/ML  SOLN
75.0000 mL | Freq: Once | INTRAMUSCULAR | Status: AC | PRN
  Administered 2019-08-28: 75 mL via INTRAVENOUS

## 2019-08-28 MED ORDER — METOPROLOL SUCCINATE ER 50 MG PO TB24
50.0000 mg | ORAL_TABLET | Freq: Every day | ORAL | Status: DC
  Administered 2019-08-28 – 2019-08-30 (×3): 50 mg via ORAL
  Filled 2019-08-28 (×3): qty 1

## 2019-08-28 MED ORDER — AMLODIPINE BESYLATE 10 MG PO TABS
10.0000 mg | ORAL_TABLET | Freq: Every day | ORAL | Status: DC
  Administered 2019-08-29 – 2019-08-30 (×2): 10 mg via ORAL
  Filled 2019-08-28 (×3): qty 1

## 2019-08-28 MED ORDER — HYDROCODONE-ACETAMINOPHEN 5-325 MG PO TABS
1.0000 | ORAL_TABLET | Freq: Four times a day (QID) | ORAL | Status: DC | PRN
  Administered 2019-08-28 – 2019-08-29 (×5): 1 via ORAL
  Filled 2019-08-28 (×5): qty 1

## 2019-08-28 MED ORDER — AMLODIPINE BESYLATE 5 MG PO TABS: 5.0000 mg | ORAL_TABLET | Freq: Every day | ORAL | Status: DC

## 2019-08-28 MED ORDER — METHYLPREDNISOLONE SODIUM SUCC 40 MG IJ SOLR: 40.0000 mg | INTRAMUSCULAR | Status: DC

## 2019-08-28 NOTE — Progress Notes (Signed)
Pt arrived from ED with a BP of 163/89, HR 111, Respirations 22, and SPO2 on 2L of O2 was 83%. Writer increased O2 rate to 10L/min. The pt's SPO2 increased to 100%. Writer decreased O2 rate to 4L. Pt is is the Yellow MEWS. Alerted MD and charge Nurse. Will continue to monitor closely.

## 2019-08-28 NOTE — Progress Notes (Signed)
Occupational Therapy Evaluation Patient Details Name: [name] MRN: [id number] DOB: 1946/06/08 Today's Date: 08/28/2019    History of Present Illness 74 y.o. female admitted with COPD exacerbation, recent admission 3 weeks ago with acute respiratory failure, d/c with home O2, PMH significant of  COPD, hypertension, sarcoidosis and depression/anxiety, L eye blindness, recurrent R hip dislocation.    Clinical Impression   PTA, pt was living at home with her adult children, she reports she was independent with ADL/IADL and modified independent with functional mobility at cane level. Pt currently requires minguard for seated ADL, minguard initially then minA with functional mobility at RW level due to decreased activity tolerance. She demonstrates increased DoE 3/4 with functional mobility, requiring max vc for pursed lip breathing. SpO2 99% at rest 3lnc dropped to 88% following short distance mobility.  Due to decline in current level of function, pt would benefit from acute OT to address established goals to facilitate safe D/C to venue listed below. At this time, recommend HHOt follow-up. Will continue to follow acutely.     Follow Up Recommendations  Home health OT    Equipment Recommendations  None recommended by OT    Recommendations for Other Services       Precautions / Restrictions Precautions Precautions: Fall Precaution Comments: monitor O2. Restrictions Weight Bearing Restrictions: No      Mobility Bed Mobility Overal bed mobility: Needs Assistance Bed Mobility: Supine to Sit;Sit to Supine     Supine to sit: Supervision;HOB elevated Sit to supine: Supervision   General bed mobility comments: for safety. increased time.  Transfers Overall transfer level: Needs assistance Equipment used: Rolling walker (2 wheeled) Transfers: Sit to/from Stand Sit to Stand: Min guard         General transfer comment: vc for safe hand placement    Balance Overall  balance assessment: Needs assistance Sitting-balance support: Feet supported;No upper extremity supported Sitting balance-Leahy Scale: Good     Standing balance support: Bilateral upper extremity supported;During functional activity Standing balance-Leahy Scale: Poor                             ADL either performed or assessed with clinical judgement   ADL Overall ADL's : Needs assistance/impaired Eating/Feeding: Independent;Sitting   Grooming: Oral care;Standing;Min guard   Upper Body Bathing: Min guard   Lower Body Bathing: Min guard;Sit to/from stand   Upper Body Dressing : Min guard;Sitting   Lower Body Dressing: Min guard;Sit to/from stand   Toilet Transfer: Minimal assistance;Ambulation;RW   Toileting- Clothing Manipulation and Hygiene: Minimal assistance;Sit to/from stand       Functional mobility during ADLs: Minimal assistance;Cueing for safety;Rolling walker General ADL Comments: ambulated at RW level with minguard initially progressing to minA;pt with decreased activty tolerance and increased doe 3/4 with longer distance mobiltiy     Vision Patient Visual Report: No change from baseline       Perception     Praxis      Pertinent Vitals/Pain Pain Assessment: No/denies pain Faces Pain Scale: No hurt     Hand Dominance Right   Extremity/Trunk Assessment Upper Extremity Assessment Upper Extremity Assessment: Generalized weakness   Lower Extremity Assessment Lower Extremity Assessment: Defer to PT evaluation   Cervical / Trunk Assessment Cervical / Trunk Assessment: Kyphotic   Communication Communication Communication: No difficulties   Cognition Arousal/Alertness: Awake/alert Behavior During Therapy: WFL for tasks assessed/performed Overall Cognitive Status: Within Functional Limits for tasks assessed  General Comments: funny and witty   General Comments  pt on 3lnc during mobiltiy  spo2 99 at rest dropped down to 67 following mobiltiy i    Exercises     Shoulder Instructions      Home Living Family/patient expects to be discharged to:: Private residence Living Arrangements: Children Available Help at Discharge: Family Type of Home: House Home Access: Stairs to enter Technical brewer of Steps: 2   [place]: One level     Bathroom Shower/Tub: Teacher, early years/pre: [place]: Cane - quad;Bedside commode          Prior Functioning/Environment Level of Independence: Independent with assistive device(s)        Comments: uses quad cane for ambulation. no assist needed for ADLs, was cooking        OT Problem List: Decreased strength;Impaired balance (sitting and/or standing);Decreased activity tolerance;Decreased knowledge of use of DME or AE;Cardiopulmonary status limiting activity;Decreased safety awareness      OT Treatment/Interventions: Self-care/ADL training;DME and/or AE instruction;Therapeutic activities;Therapeutic exercise;Patient/family education;Energy conservation    OT Goals(Current goals can be found in the care plan section) Acute Rehab OT Goals Patient Stated Goal: home. to breathe better. OT Goal Formulation: With patient Time For Goal Achievement: 09/11/19 Potential to Achieve Goals: Good ADL Goals Pt Will Perform Grooming: with modified independence;standing;sitting Pt Will Perform Lower Body Dressing: with modified independence;sit to/from stand Pt Will Transfer to Toilet: with modified independence;ambulating Additional ADL Goal #1: Pt will verbalize and demonstrate 3 energy conservation strategies during ADL and functional mobility.  OT Frequency: Min 2X/week   Barriers to D/C:            Co-evaluation PT/OT/SLP Co-Evaluation/Treatment: Yes Reason for Co-Treatment: For patient/therapist safety;To address functional/ADL transfers   OT goals addressed during session: ADL's and  self-care      AM-PAC OT "6 Clicks" Daily Activity     Outcome Measure Help from another person eating meals?: A Little Help from another person taking care of personal grooming?: A Little Help from another person toileting, which includes using toliet, bedpan, or urinal?: A Little Help from another person bathing (including washing, rinsing, drying)?: A Little Help from another person to put on and taking off regular upper body clothing?: A Little Help from another person to put on and taking off regular lower body clothing?: A Little 6 Click Score: 18   End of Session Equipment Utilized During Treatment: Gait belt;Rolling walker;Oxygen Nurse Communication: Mobility status  Activity Tolerance: Patient tolerated treatment well Patient left: in bed;with call bell/phone within reach;with bed alarm set  OT Visit Diagnosis: Muscle weakness (generalized) (M62.81);Other abnormalities of gait and mobility (R26.89)                Time: [id number] OT Time Calculation (min): 24 min Charges:  OT General Charges $OT Visit: 1 Visit OT Evaluation $OT Eval Moderate Complexity: [place] OTR/L Acute Rehabilitation Services Office: [place] 08/28/2019, 4:13 PM

## 2019-08-28 NOTE — H&P (Signed)
History and Physical    [name] [id number] DOB: 1946/05/22 DOA: 08/27/2019  PCP: Isaac Bliss, Rayford Halsted, MD (Confirm with patient/family/NH records and if not entered, this has to be entered at [facility] point of entry) Patient coming from: Home     I have personally briefly reviewed patient's old medical records in [place]  Chief Complaint: Dyspnea and wheezing  HPI: [name] is a 74 y.o. female with medical history significant of COPD, hypertension, sarcoidosis and depression/anxiety presented to ED for evaluation of severe dyspnea and wheezing.  Patient states that she was recently admitted for same problem and was discharged 3 weeks ago with home oxygen.  Patient states that she needed 2 L of oxygen at home regularly but for the last 2 to 3 days the oxygen requirement increased to 5 L with no relief.  Patient also reported that she ran out of home oxygen tonight.  Patient states that she is having severe dyspnea with mild exertion and episodic cough with wheezing.  Patient otherwise denies fever, chills, sore throat, loss of taste and smell sensation, contact with any sick individual, chest pain, nausea, vomiting, abdominal pain and urinary symptoms.  ED Course: On arrival to the ED patient had blood pressure of 119/67, heart rate 86, temperature 98.5, respiratory rate 16 and oxygen saturation 96%.  Blood work showed hemoglobin 9.2 and albumin 2.4 while rest of the blood work within normal limits.  Chest x-ray showed calcified hilar nodes and infrahilar opacities.  Patient was managed in the ED with oxygen with nasal cannula, albuterol inhaler and IV Solu-Medrol.   Review of Systems: As per HPI otherwise 10 point review of systems negative.   Past Medical History:  Diagnosis Date  . Blind left eye   . Cataract of both eyes 01/18/2009   Right surgically repaired  . COPD with emphysema ([place]) 02/28/2009   PFTs 03/30/2009: FVC 67%, FEV1 48%, FEV1/FVC 52%, TLC  104%, DLCO 71%.  . Depression 06/08/2007  . Essential hypertension, benign 06/08/2007  . GERD 01/18/2009  . Osteoporosis 12/31/2012   Left femur had T score -2.5 on DEXA scan 02/26/2009.   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 Sarcoidosis 02/05/2009   PFTs 03/30/2009: FVC 67%, FEV1 48%, FEV1/FVC 52%, TLC 104%, DLCO 71%.    Past Surgical History:  Procedure Laterality Date  . CATARACT EXTRACTION Right   . COLONOSCOPY  2002  . HIP ARTHROPLASTY Right   . TUBAL LIGATION       reports that she quit smoking about 9 years ago. She has a 7.50 pack-year smoking history. She has never used smokeless tobacco. She reports current alcohol use. She reports that she does not use drugs.  Allergies  Allergen Reactions  . Ace Inhibitors Swelling  . Lisinopril Swelling    REACTION: Angioedema  . Valsartan Swelling    REACTION: angioedema.  Pt should not get ACEI or ARBs of any kind due to angioedema.  . Aspirin Other (See Comments)    REACTION: GI ulcer.  . Penicillins     Childhood allergy Has patient had a PCN reaction causing immediate rash, facial/tongue/throat swelling, SOB or lightheadedness with hypotension: Unknown Has patient had a PCN reaction causing severe rash involving mucus membranes or skin necrosis: Unknown Has patient had a PCN reaction that required hospitalization: Unknown Has patient had a PCN reaction occurring within the last 10 years: Unknown If all of the above answers are "NO", then may proceed  with Cephalosporin use.     Family History  Problem Relation Age of Onset  . Cancer Mother   . Heart disease Mother   . Breast cancer Mother   . Stroke Father   . Cancer Sister   . Heart disease Sister   . Asthma Daughter   . Cancer Maternal Aunt   . Cancer Other   . Diabetes Other   . Hypertension Other   . Colon cancer Neg Hx      Prior to Admission medications   Medication Sig Start Date End Date Taking? Authorizing Provider  albuterol  (PROVENTIL) (2.5 MG/3ML) 0.083% nebulizer solution Take 3 mLs (2.5 mg total) by nebulization every 6 (six) hours as needed. As needed for SOB 04/15/19  Yes Isaac Bliss, Rayford Halsted, MD  albuterol (VENTOLIN HFA) 108 (90 Base) MCG/ACT inhaler Inhale 1-2 puffs into the lungs every 6 (six) hours as needed for wheezing or shortness of breath. 04/15/19 04/14/20 Yes Erline Hau, MD  amLODipine (NORVASC) 5 MG tablet Take 1 tablet (5 mg total) by mouth daily. 02/22/19 02/22/20 Yes Erline Hau, MD  gabapentin (NEURONTIN) 100 MG capsule Take 100 mg by mouth at bedtime.   Yes [provider]  hydrochlorothiazide (HYDRODIURIL) 25 MG tablet Take 25 mg by mouth daily.   Yes [provider]  HYDROcodone-acetaminophen (NORCO/VICODIN) 5-325 MG tablet Take 1 tablet by mouth every 6 (six) hours as needed for moderate pain.   Yes [provider]  ipratropium-albuterol (DUONEB) 0.5-2.5 (3) MG/3ML SOLN USE 3 ML VIA NEBULIZER EVERY 4 HOURS AS NEEDED Patient taking differently: Take 3 mLs by nebulization every 4 (four) hours as needed (sob).  06/18/18  Yes Olalere, Adewale A, MD  Magnesium 500 MG TABS Take 500 mg by mouth at bedtime.   Yes [provider]  metoprolol succinate (TOPROL-XL) 50 MG 24 hr tablet TAKE 1 TABLET BY MOUTH EVERY DAY TAKE WITH OR IMMEDIATELY FOLLOWING A MEAL. Patient taking differently: Take 50 mg by mouth daily.  06/14/19  Yes Isaac Bliss, Rayford Halsted, MD  mirtazapine (REMERON) 7.5 MG tablet Take 3.75 mg by mouth at bedtime. 07/20/19  Yes [provider]  pantoprazole (PROTONIX) 40 MG tablet Take 1 tablet (40 mg total) by mouth daily. 02/22/19  Yes Isaac Bliss, Rayford Halsted, MD  potassium citrate (UROCIT-K) 10 MEQ (1080 MG) SR tablet Take 10 mEq by mouth daily.   Yes [provider]  pravastatin (PRAVACHOL) 40 MG tablet TAKE 1 TABLET BY MOUTH EVERY DAY IN THE EVENING Patient taking differently: Take 40 mg by mouth at bedtime.   02/22/19  Yes Isaac Bliss, Rayford Halsted, MD  sertraline (ZOLOFT) 25 MG tablet Take 25 mg by mouth at bedtime. 07/19/19  Yes [provider]  Vitamin D, Ergocalciferol, (DRISDOL) 1.25 MG (50000 UT) CAPS capsule Take 50,000 Units by mouth once a week. 05/06/19  Yes [provider]  predniSONE (DELTASONE) 20 MG tablet Take 1-2 tablets (20-40 mg total) by mouth daily with breakfast. Take 40mg  daily for 3days then 20mg  daily for 3days then STOP Patient not taking: Reported on 08/27/2019 08/13/19   Domenic Polite, MD    Physical Exam: Vitals:   08/28/19 0200 08/28/19 0230 08/28/19 0300 08/28/19 0330  BP: 131/74 (!) 134/115 119/60 129/74  Pulse: (!) 101 97 97 96  Resp: (!) 24 19 (!) 23 (!) 27  Temp:      TempSrc:      SpO2: 97% 95% 97% 94%  Constitutional: NAD, calm, comfortable Vitals:   08/28/19 0200 08/28/19 0230 08/28/19 0300 08/28/19 0330  BP: 131/74 (!) 134/115 119/60 129/74  Pulse: (!) 101 97 97 96  Resp: (!) 24 19 (!) 23 (!) 27  Temp:      TempSrc:      SpO2: 97% 95% 97% 94%   General: Patient is a 74 year old African-American female who looks sick but not in any acute distress. Eyes: PERRL, lids and conjunctivae normal ENMT: Mucous membranes are moist. Posterior pharynx clear of any exudate or lesions.Normal dentition.  Neck: normal, supple, no masses, no thyromegaly Respiratory: Patient on 3 L of oxygen with nasal cannula.  Diminished breath sounds in bilateral lower lobes and diffuse wheezing bilaterally.  No rales or rhonchi. No accessory muscle use.  Cardiovascular: Regular rate and rhythm, no murmurs / rubs / gallops. No extremity edema. 2+ pedal pulses. No carotid bruits.  Abdomen: no tenderness, no masses palpated. No hepatosplenomegaly. Bowel sounds positive.  Musculoskeletal: no clubbing / cyanosis. No joint deformity upper and lower extremities. Good ROM, no contractures. Normal muscle tone.  Skin: no rashes, lesions, ulcers. No induration Neurologic:  CN 2-12 grossly intact. Sensation intact, DTR normal. Strength 4/5 in all 4.  Psychiatric: Normal judgment and insight. Alert and oriented x 3. Normal mood.  Labs on Admission: I have personally reviewed following labs and imaging studies  CBC: Recent Labs  Lab 08/27/19 2109  WBC 5.7  NEUTROABS 3.8  HGB 9.2*  HCT 27.9*  MCV 85.1  PLT [id number]*   Basic Metabolic Panel: Recent Labs  Lab 08/27/19 2109  NA 132*  K 4.0  CL 94*  CO2 29  GLUCOSE 80  BUN 13  CREATININE 0.63  CALCIUM 8.4*  MG 1.7   GFR: CrCl cannot be calculated (Unknown ideal weight.). Liver Function Tests: Recent Labs  Lab 08/27/19 2109  AST 23  ALT 22  ALKPHOS 101  BILITOT 0.8  PROT 5.5*  ALBUMIN 2.4*   No results for input(s): LIPASE, AMYLASE in the last 168 hours. No results for input(s): AMMONIA in the last 168 hours. Coagulation Profile: No results for input(s): INR, PROTIME in the last 168 hours. Cardiac Enzymes: No results for input(s): CKTOTAL, CKMB, CKMBINDEX, TROPONINI in the last 168 hours. BNP (last 3 results) No results for input(s): PROBNP in the last 8760 hours. HbA1C: No results for input(s): HGBA1C in the last 72 hours. CBG: No results for input(s): GLUCAP in the last 168 hours. Lipid Profile: No results for input(s): CHOL, HDL, LDLCALC, TRIG, CHOLHDL, LDLDIRECT in the last 72 hours. Thyroid Function Tests: No results for input(s): TSH, T4TOTAL, FREET4, T3FREE, THYROIDAB in the last 72 hours. Anemia Panel: No results for input(s): VITAMINB12, FOLATE, FERRITIN, TIBC, IRON, RETICCTPCT in the last 72 hours. Urine analysis:    Component Value Date/Time   COLORURINE YELLOW 06/01/2019 0116   APPEARANCEUR CLOUDY (A) 06/01/2019 0116   LABSPEC 1.017 06/01/2019 0116   PHURINE 5.0 06/01/2019 0116   GLUCOSEU NEGATIVE 06/01/2019 0116   GLUCOSEU NEG mg/dL 01/18/2009 1723   HGBUR NEGATIVE 06/01/2019 0116   BILIRUBINUR MODERATE (A) 06/01/2019 0116   KETONESUR NEGATIVE 06/01/2019 0116    PROTEINUR NEGATIVE 06/01/2019 0116   UROBILINOGEN 0.2 12/16/2010 1802   NITRITE NEGATIVE 06/01/2019 0116   LEUKOCYTESUR TRACE (A) 06/01/2019 0116    Radiological Exams on Admission: DG Chest Port 1 View  Result Date: 08/27/2019 CLINICAL DATA:  Shortness of breath. EXAM: PORTABLE CHEST 1 VIEW COMPARISON:  Radiograph 08/07/2019. CT 07/02/2018 FINDINGS: Severe  chronic lung disease with multifocal scarring and bilateral hilar retraction. Slight progression in patchy opacity in the infrahilar regions since prior exam. Heart is normal in size with unchanged mediastinal contours. Calcified mediastinal and hilar lymph nodes consistent with sarcoidosis. Blunting of the costophrenic angles, slight progression on the left, small pleural effusions versus scarring. No pneumothorax. No acute osseous abnormalities are seen. IMPRESSION: 1. Slight progression in patchy infrahilar opacities since prior exam, may be atelectasis, pneumonia or less likely pulmonary edema. 2. Blunting of the costophrenic angles may represent small pleural effusions or lung base scarring. 3. Background chronic lung disease with multifocal scarring, fibrosis, and calcified mediastinal nodes consistent with advanced sarcoidosis. Electronically Signed   By: Keith Rake M.D.   On: 08/27/2019 19:12    Assessment/Plan Principal Problem:   COPD exacerbation (HCC) Continue oxygen supplementation with nasal cannula and oxygen saturation above 88%. Continue albuterol as needed for shortness of breath. IV Solu-Medrol 40 mg daily. Doxycycline tablet 100 mg twice daily for productive cough due to COPD exacerbation.  Active Problems:   Essential hypertension, benign Stable Continue home medications    Acute on chronic respiratory failure with hypoxia (HCC)  Continue oxygen supplementation with nasal cannula to maintain oxygen saturation above 88%.   DVT prophylaxis: Lovenox CODE STATUS: Full code Family Communication:  Admission  status: Observation/MedSurg   Edmonia Lynch MD [facility] Pager [phone]   If 7PM-7AM, please contact night-coverage [email or web address] Password   08/28/2019, 4:44 AM

## 2019-08-28 NOTE — Plan of Care (Signed)
74 year old female with history of oxygen dependent COPD hypertension and sarcoidosis admitted with dyspnea on exertion and wheezing.  She was discharged on home oxygen during her last admission 3 weeks ago.  Normally she is on 2 L of oxygen at home but she increased her oxygen to 5 L at home since she was more short of breath.  He ran out of home oxygen the night she was admitted to the hospital. When I saw her she was on 3 L of oxygen, intermittently tachypneic and tachycardic. She complained of shortness of breath even with movement in bed. At baseline she walks with a walker at home prior to the last hospitalization. He still has diffuse wheezing we will increase Solu-Medrol to 40 mg every 6. Will DC doxycycline as she received IV antibiotics 3 weeks ago during her hospital stay.  She has been afebrile. CT of the chest in 2019 shows extensive fibrosis consistent with sarcoidosis. I will check a repeat chest CT today and compared with 2019. Patient was told to follow-up with outpatient pulmonary but has not had followed up with anyone yet. 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 She needs ongoing hospital stay due to increased oxygen requirement wheezing severe dyspnea on exertion and further work-up.

## 2019-08-28 NOTE — Progress Notes (Signed)
Writer spoke with pt's daughter and she informed me that the pt lives with the daughter and her grandson. The grandson tested positive for COVID-19. The pt's [id number] results are negative, however the pt informed me that she is having difficulty tasting. I informed the MD.

## 2019-08-28 NOTE — Evaluation (Signed)
Physical Therapy Evaluation Patient Details Name: [name] MRN: [id number] DOB: 04/15/1946 Today's Date: 08/28/2019   History of Present Illness  74 y.o. female admitted with COPD exacerbation, recent admission 3 weeks ago with acute respiratory failure, d/c with home O2, PMH significant of  COPD, hypertension, sarcoidosis and depression/anxiety, L eye blindness, recurrent R hip dislocation.   Clinical Impression  Pt admitted as above and presents with functional mobility limitations 2* generalized weakness, ambulatory balance deficits and poor endurance with SOB with minimal exertion.  Pt should progress to dc home with family assist.    Follow Up Recommendations Home health PT;Supervision/Assistance - 24 hour    Equipment Recommendations  Rolling walker with 5" wheels(Pt would benefit from RW but is resistant to useing )    Recommendations for Other Services       Precautions / Restrictions Precautions Precautions: Fall Precaution Comments: monitor O2. Restrictions Weight Bearing Restrictions: No      Mobility  Bed Mobility Overal bed mobility: Needs Assistance Bed Mobility: Supine to Sit;Sit to Supine     Supine to sit: Supervision;HOB elevated Sit to supine: Supervision   General bed mobility comments: for safety. increased time.  Transfers Overall transfer level: Needs assistance Equipment used: Rolling walker (2 wheeled) Transfers: Sit to/from Stand Sit to Stand: Min guard Stand pivot transfers: Supervision;Min guard       General transfer comment: vc for safe hand placement  Ambulation/Gait Ambulation/Gait assistance: Min assist Gait Distance (Feet): 32 Feet Assistive device: Rolling walker (2 wheeled) Gait Pattern/deviations: Step-through pattern;Decreased stride length;Shuffle;Trunk flexed     General Gait Details: cues for posture, position from RW and to slow pace; distance ltd by SOB with exertion  Stairs            Wheelchair  Mobility    Modified Rankin (Stroke Patients Only)       Balance Overall balance assessment: Needs assistance Sitting-balance support: Feet supported;No upper extremity supported Sitting balance-Leahy Scale: Good     Standing balance support: Bilateral upper extremity supported;During functional activity Standing balance-Leahy Scale: Poor                               Pertinent Vitals/Pain Pain Assessment: No/denies pain Faces Pain Scale: No hurt    Home Living Family/patient expects to be discharged to:: Private residence Living Arrangements: Children Available Help at Discharge: Family Type of Home: House Home Access: Stairs to enter   Technical brewer of Steps: 2 Home Layout: One level Home Equipment: Cane - quad;Bedside commode      Prior Function Level of Independence: Independent with assistive device(s)         Comments: uses quad cane for ambulation. no assist needed for ADLs, was cooking     Hand Dominance   Dominant Hand: Right    Extremity/Trunk Assessment   Upper Extremity Assessment Upper Extremity Assessment: Generalized weakness    Lower Extremity Assessment Lower Extremity Assessment: Generalized weakness    Cervical / Trunk Assessment Cervical / Trunk Assessment: Kyphotic  Communication   Communication: No difficulties  Cognition Arousal/Alertness: Awake/alert Behavior During Therapy: WFL for tasks assessed/performed;Impulsive Overall Cognitive Status: Within Functional Limits for tasks assessed                                 General Comments: funny and witty      General Comments General comments (  skin integrity, edema, etc.): Pt is on 3L O2    Exercises     Assessment/Plan    PT Assessment Patient needs continued PT services  PT Problem List Decreased strength;Decreased mobility;Decreased activity tolerance;Decreased balance;Decreased knowledge of use of DME       PT Treatment  Interventions DME instruction;Gait training;Therapeutic activities;Therapeutic exercise;Balance training;Functional mobility training;Patient/family education    PT Goals (Current goals can be found in the Care Plan section)  Acute Rehab PT Goals Patient Stated Goal: home. to breathe better. PT Goal Formulation: With patient Time For Goal Achievement: 09/11/19 Potential to Achieve Goals: Fair    Frequency Min 3X/week   Barriers to discharge        Co-evaluation PT/OT/SLP Co-Evaluation/Treatment: Yes Reason for Co-Treatment: For patient/therapist safety PT goals addressed during session: Mobility/safety with mobility OT goals addressed during session: ADL's and self-care       AM-PAC PT "6 Clicks" Mobility  Outcome Measure Help needed turning from your back to your side while in a flat bed without using bedrails?: A Little Help needed moving from lying on your back to sitting on the side of a flat bed without using bedrails?: A Little Help needed moving to and from a bed to a chair (including a wheelchair)?: A Little Help needed standing up from a chair using your arms (e.g., wheelchair or bedside chair)?: A Little Help needed to walk in hospital room?: A Little Help needed climbing 3-5 steps with a railing? : A Lot 6 Click Score: 17    End of Session Equipment Utilized During Treatment: Oxygen;Gait belt Activity Tolerance: Patient limited by fatigue Patient left: in bed;with call bell/phone within reach;with bed alarm set Nurse Communication: Mobility status PT Visit Diagnosis: Muscle weakness (generalized) (M62.81);Difficulty in walking, not elsewhere classified (R26.2);Unsteadiness on feet (R26.81)    Time: [id number] PT Time Calculation (min) (ACUTE ONLY): 24 min   Charges:   PT Evaluation $PT Eval Low Complexity: 1 Low          [place] Pager [phone] Office [phone]   [name] 08/28/2019, 4:34  PM

## 2019-08-29 ENCOUNTER — Other Ambulatory Visit: Payer: Self-pay

## 2019-08-29 ENCOUNTER — Encounter (HOSPITAL_COMMUNITY): Payer: Self-pay | Admitting: Internal Medicine

## 2019-08-29 LAB — SARS CORONAVIRUS 2 (TAT 6-24 HRS): SARS Coronavirus 2: NEGATIVE

## 2019-08-29 MED ORDER — ADULT MULTIVITAMIN W/MINERALS CH
1.0000 | ORAL_TABLET | Freq: Every day | ORAL | Status: DC
  Administered 2019-08-29 – 2019-08-30 (×2): 1 via ORAL
  Filled 2019-08-29 (×2): qty 1

## 2019-08-29 MED ORDER — ACETAMINOPHEN 325 MG PO TABS
650.0000 mg | ORAL_TABLET | Freq: Four times a day (QID) | ORAL | Status: DC | PRN
  Administered 2019-08-29: 650 mg via ORAL
  Filled 2019-08-29: qty 2

## 2019-08-29 MED ORDER — PRO-STAT SUGAR FREE PO LIQD
30.0000 mL | Freq: Two times a day (BID) | ORAL | Status: DC
  Administered 2019-08-29: 30 mL via ORAL
  Filled 2019-08-29: qty 30

## 2019-08-29 MED ORDER — ENSURE ENLIVE PO LIQD
237.0000 mL | Freq: Two times a day (BID) | ORAL | Status: DC
  Administered 2019-08-29: 237 mL via ORAL

## 2019-08-29 NOTE — Progress Notes (Addendum)
PROGRESS NOTE    [name]  [id number] DOB: 03-21-1946 DOA: 08/27/2019 PCP: Isaac Bliss, Rayford Halsted, MD    Brief Narrative: 74 y.o. female with medical history significant of COPD, hypertension, sarcoidosis and depression/anxiety presented to ED for evaluation of severe dyspnea and wheezing.  Patient states that she was recently admitted for same problem and was discharged 3 weeks ago with home oxygen.  Patient states that she needed 2 L of oxygen at home regularly but for the last 2 to 3 days the oxygen requirement increased to 5 L with no relief.  Patient also reported that she ran out of home oxygen tonight.  Patient states that she is having severe dyspnea with mild exertion and episodic cough with wheezing.  Patient otherwise denies fever, chills, sore throat, loss of taste and smell sensation, contact with any sick individual, chest pain, nausea, vomiting, abdominal pain and urinary symptoms.  ED Course: On arrival to the ED patient had blood pressure of 119/67, heart rate 86, temperature 98.5, respiratory rate 16 and oxygen saturation 96%.  Blood work showed hemoglobin 9.2 and albumin 2.4 while rest of the blood work within normal limits.  Chest x-ray showed calcified hilar nodes and infrahilar opacities.  Patient was managed in the ED with oxygen with nasal cannula, albuterol inhaler and IV Solu-Medrol.  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  She also has a diagnosis of sarcoid. Patient complains of severe dyspnea on exertion. She is on oxygen 24/7 between 2.5 to 3 L. She is still actively wheezing I will continue IV steroids and nebulizer treatments.  Taper  slowly. Patient could not afford Advair so she continued to take DuoNeb every 4 at home.  #2 essential hypertension on Norvasc 10 mg daily, metoprolol 50 mg daily  #3 depression on Zoloft 25 mg daily Pressure Injury 08/07/19 Sacrum Stage 2 -  Partial thickness loss of dermis presenting as a shallow open injury with a red, pink wound bed without slough. healing scabbed open. blanchable  (Active)  08/07/19 0510  Location: Sacrum  Location Orientation:   Staging: Stage 2 -  Partial thickness loss of dermis presenting as a shallow open injury with a red, pink wound bed without slough.  Wound Description (Comments): healing scabbed open. blanchable   Present on Admission: Yes      Nutrition Problem: Increased nutrient needs Etiology: acute illness, wound healing     Signs/Symptoms: estimated needs    Interventions: Ensure Enlive (each supplement provides 350kcal and 20 grams of protein), Prostat, MVI  Estimated body mass index is 20.07 kg/m as calculated from the following:   Height as of 08/07/19: 5\' 3"  (1.6 m).   Weight as of 08/07/19: 51.4 kg.  DVT prophylaxis: Lovenox Code Status: Full code Family Communication: Discussed with her daughter disposition Plan: Pending clinical improvement, patient still very dyspneic and diffusely wheezing on IV steroids PT recommends home PT   Consultants:   None  Procedures: None Antimicrobials: None Subjective: Resting in bed reports dyspnea on exertion with minimal movement  Objective: Vitals:   08/29/19 0951 08/29/19 1110 08/29/19 1114 08/29/19 1255  BP:  136/79 136/79   Pulse:  100 100 100  Resp:  (!) 22 (!)  22   Temp:  98.2 F (36.8 C) 98.2 F (36.8 C)   TempSrc:  Oral Oral   SpO2: 92% 96% 98% 98%   No intake or output data in the 24 hours ending 08/29/19 1442 There were no vitals filed for this visit.  Examination:  General exam: Appears calm and comfortable  Respiratory system: diffuse wheezing to auscultation.  Respiratory effort normal. Cardiovascular system: S1 & S2 heard, RRR. No JVD, murmurs, rubs, gallops or clicks. No pedal edema. Gastrointestinal system: Abdomen is nondistended, soft and nontender. No organomegaly or masses felt. Normal bowel sounds heard. Central nervous system: Alert and oriented. No focal neurological deficits. Extremities: Symmetric 5 x 5 power. Skin: No rashes, lesions or ulcers Psychiatry: Judgement and insight appear normal. Mood & affect appropriate.     Data Reviewed: I have personally reviewed following labs and imaging studies  CBC: Recent Labs  Lab 08/27/19 2109 08/28/19 0535  WBC 5.7 6.3  NEUTROABS 3.8  --   HGB 9.2* 9.6*  HCT 27.9* 29.6*  MCV 85.1 85.3  PLT 116* 0000000*   Basic Metabolic Panel: Recent Labs  Lab 08/27/19 2109 08/28/19 0535  NA 132*  --   K 4.0  --   CL 94*  --   CO2 29  --   GLUCOSE 80  --   BUN 13  --   CREATININE 0.63 0.79  CALCIUM 8.4*  --   MG 1.7  --    GFR: CrCl cannot be calculated (Unknown ideal weight.). Liver Function Tests: Recent Labs  Lab 08/27/19 2109  AST 23  ALT 22  ALKPHOS 101  BILITOT 0.8  PROT 5.5*  ALBUMIN 2.4*   No results for input(s): LIPASE, AMYLASE in the last 168 hours. No results for input(s): AMMONIA in the last 168 hours. Coagulation Profile: No results for input(s): INR, PROTIME in the last 168 hours. Cardiac Enzymes: No results for input(s): CKTOTAL, CKMB, CKMBINDEX, TROPONINI in the last 168 hours. BNP (last 3 results) No results for input(s): PROBNP in the last 8760 hours. HbA1C: No results for input(s): HGBA1C in the last 72 hours. CBG: No results for input(s): GLUCAP in the last 168 hours. Lipid Profile: No results for input(s): CHOL, HDL, LDLCALC, TRIG, CHOLHDL, LDLDIRECT in the last 72 hours. Thyroid Function Tests: No results for input(s): TSH, T4TOTAL, FREET4, T3FREE, THYROIDAB in the last 72 hours. Anemia Panel: No results for input(s): VITAMINB12, FOLATE, FERRITIN,  TIBC, IRON, RETICCTPCT in the last 72 hours. Sepsis Labs: No results for input(s): PROCALCITON, LATICACIDVEN in the last 168 hours.  Recent Results (from the past 240 hour(s))  SARS CORONAVIRUS 2 (TAT 6-24 HRS) Nasopharyngeal Nasopharyngeal Swab     Status: None   Collection Time: 08/27/19 11:14 PM   Specimen: Nasopharyngeal Swab  Result Value Ref Range Status   SARS Coronavirus 2 NEGATIVE NEGATIVE Final    Comment: (NOTE) SARS-CoV-2 target nucleic acids are NOT DETECTED. The SARS-CoV-2 RNA is generally detectable in upper and lower respiratory specimens during the acute phase of infection. Negative results do not preclude SARS-CoV-2 infection, do not rule out co-infections with other pathogens, and should not be used as the sole basis for treatment or other patient management decisions. Negative results must be combined with clinical observations, patient history, and epidemiological information. The expected result is Negative. Fact Sheet for Patients: SugarRoll.be Fact Sheet for Healthcare Providers: https://www.woods-.com/ This test is not yet approved or cleared by the [place] FDA and  has been authorized for detection and/or  diagnosis of SARS-CoV-2 by FDA under an Emergency Use Authorization (EUA). This EUA will remain  in effect (meaning this test can be used) for the duration of the COVID-19 declaration under Section 56 4(b)(1) of the Act, 21 U.S.C. section 360bbb-3(b)(1), unless the authorization is terminated or revoked sooner. Performed at [facility], [place] [address] [place], [place] [address]   SARS CORONAVIRUS 2 (TAT 6-24 HRS) Nasopharyngeal Nasopharyngeal Swab     Status: None   Collection Time: 08/28/19  7:50 PM   Specimen: Nasopharyngeal Swab  Result Value Ref Range Status   SARS Coronavirus 2 NEGATIVE NEGATIVE Final    Comment: (NOTE) SARS-CoV-2 target nucleic acids are NOT DETECTED. The SARS-CoV-2  RNA is generally detectable in upper and lower respiratory specimens during the acute phase of infection. Negative results do not preclude SARS-CoV-2 infection, do not rule out co-infections with other pathogens, and should not be used as the sole basis for treatment or other patient management decisions. Negative results must be combined with clinical observations, patient history, and epidemiological information. The expected result is Negative. Fact Sheet for Patients: SugarRoll.be Fact Sheet for Healthcare Providers: https://www.woods-.com/ This test is not yet approved or cleared by the [place] FDA and  has been authorized for detection and/or diagnosis of SARS-CoV-2 by FDA under an Emergency Use Authorization (EUA). This EUA will remain  in effect (meaning this test can be used) for the duration of the COVID-19 declaration under Section 56 4(b)(1) of the Act, 21 U.S.C. section 360bbb-3(b)(1), unless the authorization is terminated or revoked sooner. Performed at [facility], [place] [address] [place], [place] [address]          Radiology Studies: CT CHEST W CONTRAST  Result Date: 08/28/2019 CLINICAL DATA:  Short of breath, sarcoidosis. EXAM: CT CHEST WITH CONTRAST TECHNIQUE: Multidetector CT imaging of the chest was performed during intravenous contrast administration. CONTRAST:  63mL OMNIPAQUE IOHEXOL 300 MG/ML  SOLN COMPARISON:  CT 07/02/2018 FINDINGS: Cardiovascular: No filling defects within the pulmonary arteries to suggest acute pulmonary embolism. Mediastinum/Nodes: Multiple densely calcified mediastinal lymph nodes. No measurable lymphadenopathy. Esophagus normal. Lungs/Pleura: There is chronic atelectasis in the posterior upper and lower lobes. Chronic peribronchovascular thickening throughout the lungs. There is superimposed ground-glass opacities in lower lobes (image 79/7). No discrete nodularity. Upper  Abdomen: Rounded soft tissue thickening along the LEFT crus of the diaphragm measuring 2.5 cm not changed comparison exam. Adrenal glands normal. Musculoskeletal: No aggressive osseous lesion. IMPRESSION: 1. No acute pulmonary embolism. 2. Chronic findings of sarcoidosis include calcified mediastinal lymph nodes and pulmonary findings including bronchiectasis and peribronchial thickening. 3. Superimposed ground-glass opacities in the lower lobes consistent with edema or infection. COVID viral pneumonia could have this pattern. Electronically Signed   By: Suzy Bouchard M.D.   On: 08/28/2019 11:02   DG Chest Port 1 View  Result Date: 08/27/2019 CLINICAL DATA:  Shortness of breath. EXAM: PORTABLE CHEST 1 VIEW COMPARISON:  Radiograph 08/07/2019. CT 07/02/2018 FINDINGS: Severe chronic lung disease with multifocal scarring and bilateral hilar retraction. Slight progression in patchy opacity in the infrahilar regions since prior exam. Heart is normal in size with unchanged mediastinal contours. Calcified mediastinal and hilar lymph nodes consistent with sarcoidosis. Blunting of the costophrenic angles, slight progression on the left, small pleural effusions versus scarring. No pneumothorax. No acute osseous abnormalities are seen. IMPRESSION: 1. Slight progression in patchy infrahilar opacities since prior exam, may be atelectasis, pneumonia or less likely pulmonary edema. 2. Blunting of the  costophrenic angles may represent small pleural effusions or lung base scarring. 3. Background chronic lung disease with multifocal scarring, fibrosis, and calcified mediastinal nodes consistent with advanced sarcoidosis. Electronically Signed   By: Keith Rake M.D.   On: 08/27/2019 19:12        Scheduled Meds: . amLODipine  10 mg Oral Daily  . enoxaparin (LOVENOX) injection  40 mg Subcutaneous QHS  . feeding supplement (ENSURE ENLIVE)  237 mL Oral BID BM  . feeding supplement (PRO-STAT SUGAR FREE 64)  30 mL Oral  BID  . gabapentin  100 mg Oral QHS  . methylPREDNISolone (SOLU-MEDROL) injection  40 mg Intravenous Q6H  . metoprolol succinate  50 mg Oral Daily  . mirtazapine  3.75 mg Oral QHS  . multivitamin with minerals  1 tablet Oral Daily  . pantoprazole  40 mg Oral Daily  . sertraline  25 mg Oral QHS   Continuous Infusions:   LOS: 1 day     Georgette Shell, MD [facility]  If 7PM-7AM, please contact night-coverage [email or web address] Password [email or web address] 08/29/2019, 2:42 PM

## 2019-08-29 NOTE — Progress Notes (Signed)
Initial Nutrition Assessment  DOCUMENTATION CODES:   Not applicable  INTERVENTION:  - will order Ensure Enlive BID, each supplement provides 350 kcal and 20 grams of protein. - will order 30 mL Prostat BID, each supplement provides 100 kcal and 15 grams of protein. - will order daily multivitamin with minerals.  - weigh patient today.    NUTRITION DIAGNOSIS:   Increased nutrient needs related to acute illness, wound healing as evidenced by estimated needs.  GOAL:   Patient will meet greater than or equal to 90% of their needs  MONITOR:   PO intake, Supplement acceptance, Labs, Weight trends  REASON FOR ASSESSMENT:   Malnutrition Screening Tool, Consult Assessment of nutrition requirement/status  ASSESSMENT:   74 y.o. female with medical history significant of COPD, HTN, sarcoidosis and depression/anxiety. She presented to ED for evaluation of severe dyspnea and wheezing. She reported being recently admitted for same problem and was discharged 3 weeks ago with home oxygen.  She typically needs 2L O2, but for the 2-3 days PTA she was requiring 5L and not experiencing relief. She ran out of home O2 on the night of presentation to the ED.  Patient has been consuming 50-100% of meals since yesterday. She was recently admitted and discharged home at the end of December. She was experiencing decreased appetite in the 2-4 days PTA with increased SOB with even minimal movement.   She has not been weighed since 12/27 at which time she weighed 113 lb. On 10/27 she weighed 130 lb, but this appears to be in outlier from any time after 08/27/17 as other than weight on 10/27, weight was stable 08/27/17-08/07/19.  Per notes: - COPD exacerbation - acute on chronic respiratory failure with hypoxia   Labs reviewed; Na: 132 mmol/l, Cl: 94 mmol/l, Ca: 8.4 mg/dl. Medications reviewed; 40 mg solu-medrol QID, 40 mg oral protonix/day.     NUTRITION - FOCUSED PHYSICAL EXAM:  completed; no muscle  and no fat wasting.   Diet Order:   Diet Order            Diet Heart Room service appropriate? Yes; Fluid consistency: Thin  Diet effective now              EDUCATION NEEDS:   No education needs have been identified at this time  Skin:  Skin Assessment: Reviewed RN Assessment Skin Integrity Issues:: Stage II Stage II: sacrum  Last BM:  1/17  Height:   Ht Readings from Last 1 Encounters:  08/07/19 5\' 3"  (1.6 m)    Weight:   Wt Readings from Last 1 Encounters:  08/07/19 51.4 kg    Ideal Body Weight:  52.3 kg  BMI:  There is no height or weight on file to calculate BMI.  Estimated Nutritional Needs:   Kcal:  1540-1800 kcal  Protein:  77-90 grams  Fluid:  >/= 1.8 L/day     Jarome Matin, MS, RD, LDN, [facility] Inpatient Clinical Dietitian Pager # [phone] After hours/weekend pager # [phone]

## 2019-08-29 NOTE — TOC Initial Note (Addendum)
Transition of Care [facility] - Initial/Assessment Note    Patient Details  Name: [name] MRN: [id number] Date of Birth: 1945/08/23  Transition of Care [facility]) CM/SW Contact:    Trish Mage, LCSW Phone Number: 08/29/2019, 4:35 PM  Clinical Narrative:  [name] is here for COPD exacerbation after just having been here 2 weeks ago.  She states she is working with [facility], and would like to continue with them if possible.  She states she lives in the home with her daughter, 2 of her grandchildren, and her son, who apparently is rarely there.  She has a cane, BSC and shower chair, but no RW, and states she would like one.  She also is asking for O2 concentrator, portable, so that she can go to the Dr without taking tanks along. TOC will continue to follow during the course of hospitalization.  Addendum for d/c:  Requested order for RW from Dr.  Lane Hacker at [employer] to request portable e tank O2 for transportation home.  Patient expressed interest in portable concentrator, but [employer] does not carry those.  Let patient know, and she states she may switch companies so she can get one, but has not made that decision at this point.   TOC sign off.                  Expected Discharge Plan: [place] Barriers to Discharge: No Barriers Identified   Patient Goals and CMS Choice Patient states their goals for this hospitalization and ongoing recovery are:: "I want to keep working with the Physical therapist Jeneen Rinks when I leave."      Expected Discharge Plan and Services Expected Discharge Plan: [place]   Discharge Planning Services: CM Consult Post Acute Care Choice: Home Health, Durable Medical Equipment Living arrangements for the past 2 months: Single Family Home                                      Prior Living Arrangements/Services Living arrangements for the past 2 months: Single Family Home Lives with:: Adult Children Patient  language and need for interpreter reviewed:: Yes Do you feel safe going back to the place where you live?: Yes      Need for Family Participation in Patient Care: Yes (Comment) Care giver support system in place?: Yes (comment) Current home services: Home PT, Home OT Criminal Activity/Legal Involvement Pertinent to Current Situation/Hospitalization: No - Comment as needed  Activities of Daily Living Home Assistive Devices/Equipment: Cane (specify quad or straight) ADL Screening (condition at time of admission) Patient's cognitive ability adequate to safely complete daily activities?: Yes Is the patient deaf or have difficulty hearing?: No Does the patient have difficulty seeing, even when wearing glasses/contacts?: Yes Does the patient have difficulty concentrating, remembering, or making decisions?: No Patient able to express need for assistance with ADLs?: Yes Does the patient have difficulty dressing or bathing?: No Independently performs ADLs?: Yes (appropriate for developmental age) Does the patient have difficulty walking or climbing stairs?: Yes Weakness of Legs: Both Weakness of Arms/Hands: None  Permission Sought/Granted                  Emotional Assessment Appearance:: Appears stated age Attitude/Demeanor/Rapport: Engaged Affect (typically observed): Appropriate Orientation: : Oriented to Self, Oriented to Place, Oriented to  Time, Oriented to Situation Alcohol / Substance Use:  Not Applicable Psych Involvement: No (comment)  Admission diagnosis:  Shortness of breath [R06.02] COPD exacerbation (HCC) [J44.1] Dyspnea on minimal exertion [R06.00] Patient Active Problem List   Diagnosis Date Noted  . Acute on chronic respiratory failure with hypoxia ([place]) 08/28/2019  . COPD exacerbation ([place]) 08/27/2019  . CAP (community acquired pneumonia) 08/06/2019  . Acute respiratory failure with hypoxia ([place])   . Shock ([place])   . Central venous catheter in place   . AKI  (acute kidney injury) ([place])   . Prolonged QT interval   . Pulmonary hypertension ([place])   . Hyponatremia   . Hypoglycemia   . Encephalopathy acute   . Severe sepsis ([place]) 05/31/2019  . Low bone mass 05/24/2018  . IBS (irritable bowel syndrome) 03/01/2018  . Diarrhea 12/22/2017  . Falls 12/26/2015  . Mixed incontinence urge and stress 12/26/2015  . Hypokalemia 10/03/2015  . Encounter for central line placement 09/12/2014  . Dyslipidemia 12/31/2012  . Allergic rhinitis 12/31/2012  . Osteoarthritis, hip, bilateral 11/01/2010  . Unintentional weight loss 03/19/2009  . 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 Sarcoidosis 02/05/2009  . GERD 01/18/2009  . Essential hypertension, benign 06/08/2007   PCP:  Isaac Bliss, Rayford Halsted, MD Pharmacy:   [facility], [place] - [address] AT [address] [place] [address] Phone: [phone] Fax: [phone]  [facility] - [place], [place] [address] Phone: [phone] Fax: [phone]  [id number] - [place], [place] [address] Phone: [phone] Fax: [phone]     Social Determinants of Health (SDOH) Interventions    Readmission Risk Interventions No flowsheet data found.

## 2019-08-30 MED ORDER — PREDNISONE 10 MG PO TABS: ORAL_TABLET | ORAL | 1 refills | Status: DC

## 2019-08-30 NOTE — Progress Notes (Signed)
Physical Therapy Treatment Patient Details Name: [name] MRN: [id number] DOB: [birth year] Today's Date: 08/30/2019    History of Present Illness 74 y.o. female admitted with COPD exacerbation, recent admission 3 weeks ago with acute respiratory failure, d/c with home O2, PMH significant of  COPD, hypertension, sarcoidosis and depression/anxiety, L eye blindness, recurrent R hip dislocation.     PT Comments    Pt ambulated in hallway and maintained SpO2 93-97% on 3L O2.  Pt anticipates discharging home today.  Follow Up Recommendations  Home health PT;Supervision/Assistance - 24 hour     Equipment Recommendations  Rolling walker with 5" wheels    Recommendations for Other Services       Precautions / Restrictions Precautions Precautions: Fall Precaution Comments: monitor O2. Restrictions Weight Bearing Restrictions: No    Mobility  Bed Mobility Overal bed mobility: Needs Assistance Bed Mobility: Supine to Sit;Sit to Supine     Supine to sit: Supervision;HOB elevated Sit to supine: Supervision   General bed mobility comments: for safety. increased time.  Transfers Overall transfer level: Needs assistance Equipment used: Rolling walker (2 wheeled) Transfers: Sit to/from Stand Sit to Stand: Min guard         General transfer comment: vc for safe hand placement  Ambulation/Gait Ambulation/Gait assistance: Min guard Gait Distance (Feet): 24 Feet Assistive device: Rolling walker (2 wheeled) Gait Pattern/deviations: Step-through pattern;Decreased stride length;Trunk flexed     General Gait Details: cues for RW positioning and posture, remained on 3L O2 [place] and SPO2 93-97%   Stairs             Wheelchair Mobility    Modified Rankin (Stroke Patients Only)       Balance                                            Cognition Arousal/Alertness: Awake/alert Behavior During Therapy: WFL for tasks assessed/performed Overall  Cognitive Status: Within Functional Limits for tasks assessed                                        Exercises      General Comments        Pertinent Vitals/Pain Pain Assessment: No/denies pain    Home Living                      Prior Function            PT Goals (current goals can now be found in the care plan section) Progress towards PT goals: Progressing toward goals    Frequency    Min 3X/week      PT Plan Current plan remains appropriate    Co-evaluation              AM-PAC PT "6 Clicks" Mobility   Outcome Measure  Help needed turning from your back to your side while in a flat bed without using bedrails?: A Little Help needed moving from lying on your back to sitting on the side of a flat bed without using bedrails?: A Little Help needed moving to and from a bed to a chair (including a wheelchair)?: A Little Help needed standing up from a chair using your arms (e.g., wheelchair or bedside chair)?: A Little Help needed to walk  in hospital room?: A Little Help needed climbing 3-5 steps with a railing? : A Lot 6 Click Score: 17    End of Session Equipment Utilized During Treatment: Oxygen Activity Tolerance: Patient limited by fatigue Patient left: in bed;with call bell/phone within reach;with bed alarm set;with nursing/sitter in room;with family/visitor present Nurse Communication: Mobility status PT Visit Diagnosis: Muscle weakness (generalized) (M62.81);Difficulty in walking, not elsewhere classified (R26.2)     Time: [id number] PT Time Calculation (min) (ACUTE ONLY): 24 min  Charges:  $Gait Training: 8-22 mins                     Arlyce Dice, DPT Acute Rehabilitation Services Office: [phone]   Felise Georgia,KATHrine E 08/30/2019, 11:14 AM

## 2019-08-30 NOTE — Discharge Summary (Addendum)
Physician Discharge Summary  [name] [id number] DOB: 03-25-1946 DOA: 08/27/2019  PCP: Isaac Bliss, Rayford Halsted, MD  Admit date: 08/27/2019 Discharge date: 08/30/2019  Admitted From: Home Disposition: Home Recommendations for Outpatient Follow-up:  1. Follow up with PCP in 1-2 weeks 2. Please obtain BMP/CBC in one week 3. Follow-up with pulmonology  Home Health yes Equipment/Devices oxygen between 2.5 to 3 L Discharge Condition: Stable and improved CODE STATUS full code Diet recommendation: Cardiac diet Brief/Interim Summary:74 y.o.femalewith medical history significant ofCOPD, hypertension, sarcoidosis and depression/anxiety presented to ED for evaluation of severe dyspnea and wheezing. Patient states that she was recently admitted for same problem and was discharged 3 weeks ago with home oxygen. Patient states that she needed 2 L of oxygen at home regularly but for the last 2 to 3 days the oxygen requirement increased to 5 L with no relief. Patient also reported that she ran out of home oxygen tonight. Patient states that she is having severe dyspnea with mild exertion and episodic cough with wheezing. Patient otherwise denies fever, chills, sore throat, loss of taste and smell sensation, contact with any sick individual, chest pain, nausea, vomiting, abdominal pain and urinary symptoms.  ED Course:On arrival to the ED patient had blood pressure of 119/67, heart rate 86, temperature 98.5, respiratory rate 16 and oxygen saturation 96%. Blood work showed hemoglobin 9.2 and albumin 2.4 while rest of the blood work within normal limits. Chest x-ray showed calcified hilar nodes and infrahilar opacities. Patient was managed in the ED withoxygen with nasal cannula,albuterol inhaler and IV Solu-Medrol.  Discharge Diagnoses:  Principal Problem:   COPD exacerbation ([place]) Active Problems:   Essential hypertension, benign   Acute on chronic respiratory failure with hypoxia  (HCC)  #1 acute hypoxic respiratory failure secondary to COPD exacerbation.-Patient seen by 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  She also has a diagnosis of sarcoid. Patient complains of severe dyspnea on exertion. She is on oxygen 24/7 between 2.5 to 3 L. I will discharge her on prolonged steroid taper. She is saturating 98% on 3 L of nasal cannula. Patient could not afford Advair so she continued to take DuoNeb every 4 at home. She will be discharged home with home PT.  She needs home PT for gait training and transfer training.  She is not able to leave home without assistance.  She has unsteady gait due to her chronic medical conditions.  #2 essential hypertension on Norvasc 10 mg daily, metoprolol 50 mg daily  #3 depression on Zoloft 25 mg daily Pressure Injury 08/07/19 Sacrum Stage 2 -  Partial thickness loss of dermis presenting as a shallow open injury with a red, pink wound bed without slough. healing scabbed open. blanchable  (Active)  08/07/19 0510  Location: Sacrum  Location Orientation:   Staging: Stage 2 -  Partial thickness loss of dermis presenting as a shallow open injury with a red, pink wound bed without slough.  Wound Description (Comments): healing scabbed open. blanchable   Present on Admission: Yes     Pressure Injury 08/07/19 Sacrum Stage 2 -  Partial thickness loss of dermis presenting as a shallow open injury with a red, pink wound bed without slough. healing scabbed open. blanchable  (Active)  08/07/19 0510  Location: Sacrum  Location Orientation:   Staging: Stage 2 -  Partial thickness loss of dermis presenting as a shallow open injury with a red, pink wound bed without slough.  Wound Description (  Comments): healing scabbed open. blanchable   Present on Admission: Yes      Nutrition Problem: Increased nutrient needs Etiology: acute illness, wound healing    Signs/Symptoms: estimated  needs     Interventions: Ensure Enlive (each supplement provides 350kcal and 20 grams of protein), Prostat, MVI  Estimated body mass index is 21.64 kg/m as calculated from the following:   Height as of this encounter: 5' 2.99" (1.6 m).   Weight as of this encounter: 55.4 kg.  Discharge Instructions  Discharge Instructions    Call MD for:  difficulty breathing, headache or visual disturbances   Complete by: As directed    Call MD for:  persistant nausea and vomiting   Complete by: As directed    Call MD for:  redness, tenderness, or signs of infection (pain, swelling, redness, odor or green/yellow discharge around incision site)   Complete by: As directed    Diet - low sodium heart healthy   Complete by: As directed    Increase activity slowly   Complete by: As directed      Allergies as of 08/30/2019      Reactions   Ace Inhibitors Swelling   Lisinopril Swelling   REACTION: Angioedema   Valsartan Swelling   REACTION: angioedema.  Pt should not get ACEI or ARBs of any kind due to angioedema.   Aspirin Other (See Comments)   REACTION: GI ulcer.   Penicillins    Childhood allergy Has patient had a PCN reaction causing immediate rash, facial/tongue/throat swelling, SOB or lightheadedness with hypotension: Unknown Has patient had a PCN reaction causing severe rash involving mucus membranes or skin necrosis: Unknown Has patient had a PCN reaction that required hospitalization: Unknown Has patient had a PCN reaction occurring within the last 10 years: Unknown If all of the above answers are "NO", then may proceed with Cephalosporin use.      Medication List    TAKE these medications   albuterol 108 (90 Base) MCG/ACT inhaler Commonly known as: VENTOLIN HFA Inhale 1-2 puffs into the lungs every 6 (six) hours as needed for wheezing or shortness of breath.   albuterol (2.5 MG/3ML) 0.083% nebulizer solution Commonly known as: PROVENTIL Take 3 mLs (2.5 mg total) by  nebulization every 6 (six) hours as needed. As needed for SOB   amLODipine 5 MG tablet Commonly known as: NORVASC Take 1 tablet (5 mg total) by mouth daily.   gabapentin 100 MG capsule Commonly known as: NEURONTIN Take 100 mg by mouth at bedtime.   hydrochlorothiazide 25 MG tablet Commonly known as: HYDRODIURIL Take 25 mg by mouth daily.   HYDROcodone-acetaminophen 5-325 MG tablet Commonly known as: NORCO/VICODIN Take 1 tablet by mouth every 6 (six) hours as needed for moderate pain.   ipratropium-albuterol 0.5-2.5 (3) MG/3ML Soln Commonly known as: DUONEB USE 3 ML VIA NEBULIZER EVERY 4 HOURS AS NEEDED What changed: See the new instructions.   Magnesium 500 MG Tabs Take 500 mg by mouth at bedtime.   metoprolol succinate 50 MG 24 hr tablet Commonly known as: TOPROL-XL TAKE 1 TABLET BY MOUTH EVERY DAY TAKE WITH OR IMMEDIATELY FOLLOWING A MEAL. What changed:   how much to take  how to take this  when to take this  additional instructions   mirtazapine 7.5 MG tablet Commonly known as: REMERON Take 3.75 mg by mouth at bedtime.   pantoprazole 40 MG tablet Commonly known as: PROTONIX Take 1 tablet (40 mg total) by mouth daily.   potassium citrate  10 MEQ (1080 MG) SR tablet Commonly known as: UROCIT-K Take 10 mEq by mouth daily.   pravastatin 40 MG tablet Commonly known as: PRAVACHOL TAKE 1 TABLET BY MOUTH EVERY DAY IN THE EVENING What changed:   how much to take  how to take this  when to take this  additional instructions   predniSONE 10 MG tablet Commonly known as: DELTASONE Take 4 tablets daily for 4 days then 3 tablets daily for the next 4 days and then 2 tablets daily for the following 4 days and then stay on 10 mg daily. What changed:   medication strength  how much to take  how to take this  when to take this  additional instructions   sertraline 25 MG tablet Commonly known as: ZOLOFT Take 25 mg by mouth at bedtime.   Vitamin D  (Ergocalciferol) 1.25 MG (50000 UNIT) Caps capsule Commonly known as: DRISDOL Take 50,000 Units by mouth once a week.      Follow-up Information    Schedule an appointment as soon as possible for a visit  with Isaac Bliss, Rayford Halsted, MD.   Specialty: Internal Medicine Contact information: [place] [address] [phone]        Go to  [facility].   Why: As needed, If symptoms worsen Contact information: [place] [phone]       Laurin Coder, MD Follow up.   Specialty: Pulmonary Disease Contact information: [place] [address] [phone]          Allergies  Allergen Reactions  . Ace Inhibitors Swelling  . Lisinopril Swelling    REACTION: Angioedema  . Valsartan Swelling    REACTION: angioedema.  Pt should not get ACEI or ARBs of any kind due to angioedema.  . Aspirin Other (See Comments)    REACTION: GI ulcer.  . Penicillins     Childhood allergy Has patient had a PCN reaction causing immediate rash, facial/tongue/throat swelling, SOB or lightheadedness with hypotension: Unknown Has patient had a PCN reaction causing severe rash involving mucus membranes or skin necrosis: Unknown Has patient had a PCN reaction that required hospitalization: Unknown Has patient had a PCN reaction occurring within the last 10 years: Unknown If all of the above answers are "NO", then may proceed with Cephalosporin use.     Consultations: None  Procedures/Studies: DG Chest 2 View  Result Date: 08/06/2019 CLINICAL DATA:  Shortness of breath and chest tightness. History of chronic lung disease. EXAM: CHEST - 2 VIEW COMPARISON:  06/03/2019 as well as multiple prior x-ray and CT studies. FINDINGS: The heart size is stable. Superimposed on severe fibrotic lung disease are potentially some new infiltrates especially in the mid to lower lung zones  bilaterally. No pneumothorax or significant pleural fluid. IMPRESSION: Superimposed on severe fibrotic lung disease are potentially new infiltrates in the mid to lower lung zones bilaterally. Electronically Signed   By: Aletta Edouard M.D.   On: 08/06/2019 15:54   CT CHEST W CONTRAST  Result Date: 08/28/2019 CLINICAL DATA:  Short of breath, sarcoidosis. EXAM: CT CHEST WITH CONTRAST TECHNIQUE: Multidetector CT imaging of the chest was performed during intravenous contrast administration. CONTRAST:  48mL OMNIPAQUE IOHEXOL 300 MG/ML  SOLN COMPARISON:  CT 07/02/2018 FINDINGS: Cardiovascular: No filling defects within the pulmonary arteries to suggest acute pulmonary embolism. Mediastinum/Nodes: Multiple densely calcified mediastinal lymph nodes. No measurable lymphadenopathy. Esophagus normal. Lungs/Pleura: There is  chronic atelectasis in the posterior upper and lower lobes. Chronic peribronchovascular thickening throughout the lungs. There is superimposed ground-glass opacities in lower lobes (image 79/7). No discrete nodularity. Upper Abdomen: Rounded soft tissue thickening along the LEFT crus of the diaphragm measuring 2.5 cm not changed comparison exam. Adrenal glands normal. Musculoskeletal: No aggressive osseous lesion. IMPRESSION: 1. No acute pulmonary embolism. 2. Chronic findings of sarcoidosis include calcified mediastinal lymph nodes and pulmonary findings including bronchiectasis and peribronchial thickening. 3. Superimposed ground-glass opacities in the lower lobes consistent with edema or infection. COVID viral pneumonia could have this pattern. Electronically Signed   By: Suzy Bouchard M.D.   On: 08/28/2019 11:02   DG Chest Port 1 View  Result Date: 08/27/2019 CLINICAL DATA:  Shortness of breath. EXAM: PORTABLE CHEST 1 VIEW COMPARISON:  Radiograph 08/07/2019. CT 07/02/2018 FINDINGS: Severe chronic lung disease with multifocal scarring and bilateral hilar retraction. Slight progression in  patchy opacity in the infrahilar regions since prior exam. Heart is normal in size with unchanged mediastinal contours. Calcified mediastinal and hilar lymph nodes consistent with sarcoidosis. Blunting of the costophrenic angles, slight progression on the left, small pleural effusions versus scarring. No pneumothorax. No acute osseous abnormalities are seen. IMPRESSION: 1. Slight progression in patchy infrahilar opacities since prior exam, may be atelectasis, pneumonia or less likely pulmonary edema. 2. Blunting of the costophrenic angles may represent small pleural effusions or lung base scarring. 3. Background chronic lung disease with multifocal scarring, fibrosis, and calcified mediastinal nodes consistent with advanced sarcoidosis. Electronically Signed   By: Keith Rake M.D.   On: 08/27/2019 19:12   DG CHEST PORT 1 VIEW  Result Date: 08/08/2019 CLINICAL DATA:  Tachypnea EXAM: PORTABLE CHEST 1 VIEW COMPARISON:  Radiograph 08/06/2019 FINDINGS: Severe fibrotic lung disease is again noted with some diminishing lung volumes and increasing patchy opacities within the infrahilar lungs. Suspect trace effusions versus scarring in costophrenic sulci. Cardiomediastinal silhouette is partially obscured by opacity but is grossly similar accounting for differences technique. No acute osseous or soft tissue abnormality. IMPRESSION: 1. Severe fibrotic lung disease with some diminishing lung volumes and increasing patchy opacities within the infrahilar lungs which could reflect an acute infectious process or edema in the appropriate clinical setting. 2. Trace pleural effusions versus scarring in the costophrenic sulci. Electronically Signed   By: Lovena Le M.D.   On: 08/08/2019 00:13    (Echo, Carotid, EGD, Colonoscopy, ERCP)    Subjective: Resting in bed reports that she feels well had a good night of rest anxious to go home  Discharge Exam: Vitals:   08/29/19 2035 08/30/19 0533  BP: (!) 150/92 (!)  143/93  Pulse: (!) 103 94  Resp: 18 16  Temp: 97.7 F (36.5 C) 98.8 F (37.1 C)  SpO2: 100% 98%   Vitals:   08/29/19 1450 08/29/19 1900 08/29/19 2035 08/30/19 0533  BP: (!) 133/99  (!) 150/92 (!) 143/93  Pulse: 93  (!) 103 94  Resp: 14  18 16   Temp: 98.4 F (36.9 C)  97.7 F (36.5 C) 98.8 F (37.1 C)  TempSrc: Oral  Oral Oral  SpO2: 100%  100% 98%  Weight:  55.4 kg    Height:  5' 2.99" (1.6 m)      General: Pt is alert, awake, not in acute distress Cardiovascular: RRR, S1/S2 +, no rubs, no gallops Respiratory: Decreased wheezing bilaterally, no wheezing, no rhonchi Abdominal: Soft, NT, ND, bowel sounds + Extremities: no edema, no cyanosis    The results of  significant diagnostics from this hospitalization (including imaging, microbiology, ancillary and laboratory) are listed below for reference.     Microbiology: Recent Results (from the past 240 hour(s))  SARS CORONAVIRUS 2 (TAT 6-24 HRS) Nasopharyngeal Nasopharyngeal Swab     Status: None   Collection Time: 08/27/19 11:14 PM   Specimen: Nasopharyngeal Swab  Result Value Ref Range Status   SARS Coronavirus 2 NEGATIVE NEGATIVE Final    Comment: (NOTE) SARS-CoV-2 target nucleic acids are NOT DETECTED. The SARS-CoV-2 RNA is generally detectable in upper and lower respiratory specimens during the acute phase of infection. Negative results do not preclude SARS-CoV-2 infection, do not rule out co-infections with other pathogens, and should not be used as the sole basis for treatment or other patient management decisions. Negative results must be combined with clinical observations, patient history, and epidemiological information. The expected result is Negative. Fact Sheet for Patients: SugarRoll.be Fact Sheet for Healthcare Providers: https://www.woods-.com/ This test is not yet approved or cleared by the [place] FDA and  has been authorized for detection and/or  diagnosis of SARS-CoV-2 by FDA under an Emergency Use Authorization (EUA). This EUA will remain  in effect (meaning this test can be used) for the duration of the COVID-19 declaration under Section 56 4(b)(1) of the Act, 21 U.S.C. section 360bbb-3(b)(1), unless the authorization is terminated or revoked sooner. Performed at [facility], [place] [address], [place], [place] [address]   SARS CORONAVIRUS 2 (TAT 6-24 HRS) Nasopharyngeal Nasopharyngeal Swab     Status: None   Collection Time: 08/28/19  7:50 PM   Specimen: Nasopharyngeal Swab  Result Value Ref Range Status   SARS Coronavirus 2 NEGATIVE NEGATIVE Final    Comment: (NOTE) SARS-CoV-2 target nucleic acids are NOT DETECTED. The SARS-CoV-2 RNA is generally detectable in upper and lower respiratory specimens during the acute phase of infection. Negative results do not preclude SARS-CoV-2 infection, do not rule out co-infections with other pathogens, and should not be used as the sole basis for treatment or other patient management decisions. Negative results must be combined with clinical observations, patient history, and epidemiological information. The expected result is Negative. Fact Sheet for Patients: SugarRoll.be Fact Sheet for Healthcare Providers: https://www.woods-.com/ This test is not yet approved or cleared by the [place] FDA and  has been authorized for detection and/or diagnosis of SARS-CoV-2 by FDA under an Emergency Use Authorization (EUA). This EUA will remain  in effect (meaning this test can be used) for the duration of the COVID-19 declaration under Section 56 4(b)(1) of the Act, 21 U.S.C. section 360bbb-3(b)(1), unless the authorization is terminated or revoked sooner. Performed at [facility], [place] [address] [place], [place] [address]      Labs: BNP (last 3 results) Recent Labs    05/31/19 1821 08/27/19 2109  BNP 626.6* 870.0*    Basic Metabolic Panel: Recent Labs  Lab 08/27/19 2109 08/28/19 0535  NA 132*  --   K 4.0  --   CL 94*  --   CO2 29  --   GLUCOSE 80  --   BUN 13  --   CREATININE 0.63 0.79  CALCIUM 8.4*  --   MG 1.7  --    Liver Function Tests: Recent Labs  Lab 08/27/19 2109  AST 23  ALT 22  ALKPHOS 101  BILITOT 0.8  PROT 5.5*  ALBUMIN 2.4*   No results for input(s): LIPASE, AMYLASE in the last 168 hours. No results for input(s): AMMONIA in  the last 168 hours. CBC: Recent Labs  Lab 08/27/19 2109 08/28/19 0535  WBC 5.7 6.3  NEUTROABS 3.8  --   HGB 9.2* 9.6*  HCT 27.9* 29.6*  MCV 85.1 85.3  PLT 116* 138*   Cardiac Enzymes: No results for input(s): CKTOTAL, CKMB, CKMBINDEX, TROPONINI in the last 168 hours. BNP: Invalid input(s): POCBNP CBG: No results for input(s): GLUCAP in the last 168 hours. D-Dimer No results for input(s): DDIMER in the last 72 hours. Hgb A1c No results for input(s): HGBA1C in the last 72 hours. Lipid Profile No results for input(s): CHOL, HDL, LDLCALC, TRIG, CHOLHDL, LDLDIRECT in the last 72 hours. Thyroid function studies No results for input(s): TSH, T4TOTAL, T3FREE, THYROIDAB in the last 72 hours.  Invalid input(s): FREET3 Anemia work up No results for input(s): VITAMINB12, FOLATE, FERRITIN, TIBC, IRON, RETICCTPCT in the last 72 hours. Urinalysis    Component Value Date/Time   COLORURINE YELLOW 06/01/2019 0116   APPEARANCEUR CLOUDY (A) 06/01/2019 0116   LABSPEC 1.017 06/01/2019 0116   PHURINE 5.0 06/01/2019 0116   GLUCOSEU NEGATIVE 06/01/2019 0116   GLUCOSEU NEG mg/dL 01/18/2009 1723   HGBUR NEGATIVE 06/01/2019 0116   BILIRUBINUR MODERATE (A) 06/01/2019 0116   KETONESUR NEGATIVE 06/01/2019 0116   PROTEINUR NEGATIVE 06/01/2019 0116   UROBILINOGEN 0.2 12/16/2010 1802   NITRITE NEGATIVE 06/01/2019 0116   LEUKOCYTESUR TRACE (A) 06/01/2019 0116   Sepsis Labs Invalid input(s): PROCALCITONIN,  WBC,  LACTICIDVEN Microbiology Recent  Results (from the past 240 hour(s))  SARS CORONAVIRUS 2 (TAT 6-24 HRS) Nasopharyngeal Nasopharyngeal Swab     Status: None   Collection Time: 08/27/19 11:14 PM   Specimen: Nasopharyngeal Swab  Result Value Ref Range Status   SARS Coronavirus 2 NEGATIVE NEGATIVE Final    Comment: (NOTE) SARS-CoV-2 target nucleic acids are NOT DETECTED. The SARS-CoV-2 RNA is generally detectable in upper and lower respiratory specimens during the acute phase of infection. Negative results do not preclude SARS-CoV-2 infection, do not rule out co-infections with other pathogens, and should not be used as the sole basis for treatment or other patient management decisions. Negative results must be combined with clinical observations, patient history, and epidemiological information. The expected result is Negative. Fact Sheet for Patients: SugarRoll.be Fact Sheet for Healthcare Providers: https://www.woods-.com/ This test is not yet approved or cleared by the [place] FDA and  has been authorized for detection and/or diagnosis of SARS-CoV-2 by FDA under an Emergency Use Authorization (EUA). This EUA will remain  in effect (meaning this test can be used) for the duration of the COVID-19 declaration under Section 56 4(b)(1) of the Act, 21 U.S.C. section 360bbb-3(b)(1), unless the authorization is terminated or revoked sooner. Performed at [facility], [place] [address], [place], [place] [address]   SARS CORONAVIRUS 2 (TAT 6-24 HRS) Nasopharyngeal Nasopharyngeal Swab     Status: None   Collection Time: 08/28/19  7:50 PM   Specimen: Nasopharyngeal Swab  Result Value Ref Range Status   SARS Coronavirus 2 NEGATIVE NEGATIVE Final    Comment: (NOTE) SARS-CoV-2 target nucleic acids are NOT DETECTED. The SARS-CoV-2 RNA is generally detectable in upper and lower respiratory specimens during the acute phase of infection. Negative results do not preclude  SARS-CoV-2 infection, do not rule out co-infections with other pathogens, and should not be used as the sole basis for treatment or other patient management decisions. Negative results must be combined with clinical observations, patient history, and epidemiological information. The expected result is Negative. Fact Sheet  for Patients: SugarRoll.be Fact Sheet for Healthcare Providers: https://www.woods-Shirin Echeverry.com/ This test is not yet approved or cleared by the [place] FDA and  has been authorized for detection and/or diagnosis of SARS-CoV-2 by FDA under an Emergency Use Authorization (EUA). This EUA will remain  in effect (meaning this test can be used) for the duration of the COVID-19 declaration under Section 56 4(b)(1) of the Act, 21 U.S.C. section 360bbb-3(b)(1), unless the authorization is terminated or revoked sooner. Performed at [facility], [place] [address], [place], [place] [address]      Time coordinating discharge:  39 minutes  SIGNED:   Georgette Shell, MD  [facility] 08/30/2019, 9:38 AM Pager   If 7PM-7AM, please contact night-coverage [email or web address] Password [email or web address]

## 2019-08-30 NOTE — Progress Notes (Signed)
Occupational Therapy Treatment Patient Details Name: [name] MRN: [id number] DOB: October 31, 1945 Today's Date: 08/30/2019    History of present illness 74 y.o. female admitted with COPD exacerbation, recent admission 3 weeks ago with acute respiratory failure, d/c with home O2, PMH significant of  COPD, hypertension, sarcoidosis and depression/anxiety, L eye blindness, recurrent R hip dislocation.    OT comments  Pt making good progress with functional goals. Pt ambulated to bathroom for toileting tasks with increased DOE 3/4 with increased  activity. Reviewed energy conservation techniques with pt. Pt planning to d/c home this afternoon.   Follow Up Recommendations  Home health OT    Equipment Recommendations  None recommended by OT    Recommendations for Other Services      Precautions / Restrictions Precautions Precautions: Fall Precaution Comments: monitor O2. Restrictions Weight Bearing Restrictions: No       Mobility Bed Mobility Overal bed mobility: Needs Assistance Bed Mobility: Supine to Sit;Sit to Supine     Supine to sit: Supervision;HOB elevated Sit to supine: Supervision   General bed mobility comments: for safety. increased time.  Transfers Overall transfer level: Needs assistance Equipment used: Rolling walker (2 wheeled) Transfers: Sit to/from Stand Sit to Stand: Min guard Stand pivot transfers: Supervision       General transfer comment: vc for safe hand placement    Balance Overall balance assessment: Needs assistance Sitting-balance support: Feet supported;No upper extremity supported Sitting balance-Leahy Scale: Good     Standing balance support: Bilateral upper extremity supported;During functional activity Standing balance-Leahy Scale: Poor                             ADL either performed or assessed with clinical judgement   ADL Overall ADL's : Needs assistance/impaired Eating/Feeding: Independent;Sitting    Grooming: Wash/dry hands;Wash/dry face;Supervision/safety;Set up;Standing   Upper Body Bathing: Set up;Supervision/ safety;Sitting   Lower Body Bathing: Min guard;Sit to/from stand   Upper Body Dressing : Set up;Supervision/safety;Sitting   Lower Body Dressing: Min guard;Sit to/from stand   Toilet Transfer: Min guard;Supervision/safety;Ambulation;RW;Regular Toilet;Grab bars;Cueing for safety   Toileting- Clothing Manipulation and Hygiene: Min guard;Sit to/from stand       Functional mobility during ADLs: Min guard;Supervision/safety;Rolling walker;Cueing for safety General ADL Comments: ambulated at RW level with sup progressing to min guard A; pt with decreased activity tolerance and DOE 3/4 with increased activity     Vision Patient Visual Report: No change from baseline     Perception     Praxis      Cognition Arousal/Alertness: Awake/alert Behavior During Therapy: WFL for tasks assessed/performed Overall Cognitive Status: Within Functional Limits for tasks assessed                                          Exercises     Shoulder Instructions       General Comments      Pertinent Vitals/ Pain       Pain Assessment: No/denies pain Pain Score: 0-No pain Pain Intervention(s): Monitored during session  Home Living                                          Prior Functioning/Environment  Frequency  Min 2X/week        Progress Toward Goals  OT Goals(current goals can now be found in the care plan section)  Progress towards OT goals: Progressing toward goals  Acute Rehab OT Goals Patient Stated Goal: home. to breathe better. OT Goal Formulation: With patient  Plan Discharge plan remains appropriate    Co-evaluation                 AM-PAC OT "6 Clicks" Daily Activity     Outcome Measure   Help from another person eating meals?: None Help from another person taking care of personal  grooming?: A Little Help from another person toileting, which includes using toliet, bedpan, or urinal?: A Little Help from another person bathing (including washing, rinsing, drying)?: A Little Help from another person to put on and taking off regular upper body clothing?: None Help from another person to put on and taking off regular lower body clothing?: A Little 6 Click Score: 20    End of Session Equipment Utilized During Treatment: Gait belt;Rolling walker;Oxygen  OT Visit Diagnosis: Muscle weakness (generalized) (M62.81);Other abnormalities of gait and mobility (R26.89)   Activity Tolerance Patient tolerated treatment well   Patient Left in bed;with call bell/phone within reach;with bed alarm set   Nurse Communication          Time: [id number] OT Time Calculation (min): 28 min  Charges: OT General Charges $OT Visit: 1 Visit OT Treatments $Self Care/Home Management : 8-22 mins $Therapeutic Activity: 8-22 mins     Britt Bottom 08/30/2019, 1:42 PM

## 2019-08-30 NOTE — Progress Notes (Signed)
Pt is being discharged home today. Awaiting SW to [facility] for delivery of oxygen & DME supplies. Discharge instructions including follow up appointments and medications given to patient. Pt had no further questions at this time.

## 2019-08-31 ENCOUNTER — Telehealth: Payer: Self-pay | Admitting: *Deleted

## 2019-08-31 NOTE — Telephone Encounter (Signed)
Transition Care Management Follow-up Telephone Call   Date discharged? 08/30/2019    How have you been since you were released from the hospital? I'm doing okay    Do you understand why you were in the hospital? yes   Do you understand the discharge instructions? yes   Where were you discharged to?  Home    Items Reviewed:  Medications reviewed: yes  Allergies reviewed: yes  Dietary changes reviewed: yes  Referrals reviewed: yes   Functional Questionnaire:   Activities of Daily Living (ADLs):   She states they are independent in the following: ambulation, bathing and hygiene, feeding, continence, grooming, toileting and dressing States they require assistance with the following: N/A    Any transportation issues/concerns?: no   Any patient concerns? no   Confirmed importance and date/time of follow-up visits scheduled yes  Provider Appointment booked with Dr. Jerilee Hoh 09/06/2019 at [facility] virtually   Confirmed with patient if condition begins to worsen call PCP or go to the ER.  Patient was given the office number and encouraged to call back with question or concerns.  : yes

## 2019-09-06 ENCOUNTER — Telehealth: Payer: Self-pay | Admitting: Internal Medicine

## 2019-09-06 ENCOUNTER — Other Ambulatory Visit: Payer: Self-pay

## 2019-09-12 ENCOUNTER — Telehealth: Payer: Self-pay | Admitting: Internal Medicine

## 2019-09-12 NOTE — Telephone Encounter (Signed)
Gracee from [facility] called in to receive verbal orders for Home health orders for Physical Therapy and resumption of care. (Frequency 1W1 2W5) Annabelle Harman can be reached at [phone]

## 2019-09-16 NOTE — Telephone Encounter (Signed)
Gracee from [facility] called again about receiving verbal orders. She can be reached at [phone]

## 2019-09-20 NOTE — Telephone Encounter (Signed)
Yes for PT orders

## 2019-09-20 NOTE — Telephone Encounter (Signed)
Verbal orders given to Gracee     

## 2019-09-20 NOTE — Telephone Encounter (Signed)
Left message on machine for [name] to return our call.

## 2019-10-14 ENCOUNTER — Telehealth: Payer: Self-pay | Admitting: Internal Medicine

## 2019-10-14 DIAGNOSIS — R634 Abnormal weight loss: Secondary | ICD-10-CM

## 2019-10-14 NOTE — Telephone Encounter (Signed)
[name] also wanted to added request for verbal orders to have medical social worker evaluation for patient.

## 2019-10-14 NOTE — Addendum Note (Signed)
Addended by: Dutch Quint B on: 10/14/2019 03:38 PM   Modules accepted: Orders

## 2019-10-14 NOTE — Telephone Encounter (Signed)
[name] from [facility] would verbal orders for home health pt 2 times a week for 5 weeks and then 1 time a week for 4 weeks.

## 2019-10-18 NOTE — Telephone Encounter (Signed)
Verbal orders given  

## 2019-10-18 NOTE — Telephone Encounter (Signed)
Left message on machine for [name] to return our call.

## 2020-03-26 ENCOUNTER — Other Ambulatory Visit (HOSPITAL_COMMUNITY): Payer: Self-pay | Admitting: Geriatric Medicine

## 2020-03-26 ENCOUNTER — Other Ambulatory Visit: Payer: Self-pay | Admitting: Geriatric Medicine

## 2020-03-26 DIAGNOSIS — R932 Abnormal findings on diagnostic imaging of liver and biliary tract: Secondary | ICD-10-CM

## 2020-06-05 ENCOUNTER — Ambulatory Visit: Payer: Medicare Other | Admitting: Gastroenterology

## 2020-06-12 ENCOUNTER — Inpatient Hospital Stay
Admission: RE | Admit: 2020-06-12 | Discharge: 2020-06-12 | Disposition: A | Discharge status: Home / Self Care | Payer: Self-pay | Source: Ambulatory Visit | Attending: Surgery | Admitting: Surgery

## 2020-06-12 ENCOUNTER — Other Ambulatory Visit (HOSPITAL_COMMUNITY): Payer: Self-pay | Admitting: Surgery

## 2020-06-12 DIAGNOSIS — C801 Malignant (primary) neoplasm, unspecified: Secondary | ICD-10-CM

## 2020-06-20 ENCOUNTER — Other Ambulatory Visit: Payer: Self-pay

## 2020-07-10 ENCOUNTER — Telehealth: Payer: Self-pay | Admitting: Genetic Counselor

## 2020-07-10 NOTE — Telephone Encounter (Signed)
Received a genetic counseling referral from Dr. Zenia Resides at [place] for a fhx of cancer. [name] has been called and scheduled to see Santiago Glad on 12/29 at 1pm. Letter mailed.

## 2020-08-08 ENCOUNTER — Other Ambulatory Visit: Payer: Self-pay | Admitting: Genetic Counselor

## 2020-08-08 ENCOUNTER — Inpatient Hospital Stay: Payer: Medicare Other

## 2020-08-08 ENCOUNTER — Encounter: Payer: Self-pay | Admitting: Genetic Counselor

## 2020-08-08 ENCOUNTER — Other Ambulatory Visit: Payer: Self-pay

## 2020-08-08 ENCOUNTER — Inpatient Hospital Stay: Payer: Medicare Other | Attending: Genetic Counselor | Admitting: Genetic Counselor

## 2020-08-08 DIAGNOSIS — Z803 Family history of malignant neoplasm of breast: Secondary | ICD-10-CM

## 2020-08-08 NOTE — Progress Notes (Signed)
REFERRING PROVIDER: Isaac Bliss, Rayford Halsted, MD [address] [place],  [place] [address]  PRIMARY PROVIDER:  Isaac Bliss, Rayford Halsted, MD  PRIMARY REASON FOR VISIT:  1. Family history of breast cancer      HISTORY OF PRESENT ILLNESS:   [name], a 74 y.o. female, was seen for a [facility] cancer genetics consultation at the request of Dr. Isaac Bliss due to a family history of breast cancer.  [name] presents to clinic today to discuss the possibility of a hereditary predisposition to cancer, genetic testing, and to further clarify her future cancer risks, as well as potential cancer risks for family members.   [name] is a 74 y.o. female with no personal history of cancer.    CANCER HISTORY:  Oncology History   No history exists.     RISK FACTORS:  Menarche was at age 17.  First live birth at age 68.  OCP use for approximately 0 years.  Ovaries intact: yes.  Hysterectomy: no.  Menopausal status: postmenopausal.  HRT use: 0 years. Colonoscopy: yes; normal. Mammogram within the last year: yes. Number of breast biopsies: 0. Up to date with pelvic exams: yes. 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   Depression 06/08/2007   Essential hypertension, benign 06/08/2007   Family history of breast cancer    GERD 01/18/2009   Osteoporosis 12/31/2012   Left femur had T score -2.5 on DEXA scan 02/26/2009.    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   Vitamin D deficiency     Past Surgical History:  Procedure Laterality Date   CATARACT EXTRACTION Right    COLONOSCOPY  2002   HIP  ARTHROPLASTY Right    TUBAL LIGATION      Social History   Socioeconomic History   Marital status: Widowed    Spouse name: Not on file   Number of children: Not on file   Years of education: Not on file   Highest education level: Not on file  Occupational History   Not on file  Tobacco Use   Smoking status: Former Smoker    Packs/day: 0.25    Years: 30.00    Pack years: 7.50    Quit date: 05/11/2010    Years since quitting: 10.2   Smokeless tobacco: Never Used  Vaping Use   Vaping Use: Never used  Substance and Sexual Activity   Alcohol use: Yes    Alcohol/week: 0.0 standard drinks    Comment: wine cooler.   Drug use: No   Sexual activity: Not on file  Other Topics Concern   Not on file  Social History Narrative   Married with 4 children. Retired, previously worked as a Scientist, clinical (histocompatibility and immunogenetics).   Social Determinants of Health   Financial Resource Strain: Not on file  Food Insecurity: Not on file  Transportation Needs: Not on file  Physical Activity: Not on file  Stress: Not on file  Social Connections: Not on file     FAMILY HISTORY:  We obtained a detailed, 4-generation family history.  Significant diagnoses are listed below: Family History  Problem Relation Age of Onset   Heart disease Mother    Breast cancer Mother 5   Stroke Father    Heart disease Sister    Asthma Daughter    Cancer Other    Diabetes Other    Hypertension Other    COPD Brother        "9/11 survivor"   COPD Sister        "9/11 survivor"   Breast cancer Maternal Grandmother        dx in her 110s   Breast cancer Other        MGMs mother died in her 67s   Colon cancer Neg Hx     The patient has two daughters and two sons who are cancer free.  She has two maternal half sisters and a maternal half brother who are cancer free currently, but one sister and her brother are being worked up for breast and prostate cancer, respectively.  Both parents are  deceased.  There is no information on the paternal side of the family.  The patient's mother was diagnosed with breast cancer at age 35.  She has two sisters and two brothers who were cancer free.  Her mother was diagnosed with breast cancer in her 3's and her MGM was diagnosed with breast cancer in her 4's.  [name] is unaware of previous family history of genetic testing for hereditary cancer risks. Patient's maternal ancestors are of African American descent, and paternal ancestors are of African American descent. There is no reported Ashkenazi Jewish ancestry. There is no known consanguinity.  GENETIC COUNSELING ASSESSMENT: Ms. Buzan is a 74 y.o. female with a family history of breast cancer which is somewhat suggestive of a hereditary breast cancer syndrome and predisposition to cancer given the number of women diagnosed in the family all at young ages. We, therefore, discussed and recommended the following at today's visit.   DISCUSSION: We discussed that 5 - 10% of breast cancer is hereditary, with most cases associated with BRCA mutations.  There are other genes that can be associated with hereditary breast cancer syndromes.  These include ATM, CHEK2 and PALB2.  We discussed that testing is beneficial for several reasons including knowing how to follow individuals and understand if other family members could be at risk for cancer and allow them to undergo genetic testing.   We reviewed the characteristics, features and inheritance patterns of hereditary cancer syndromes. We also discussed genetic testing, including the appropriate family members to test, the process of testing, insurance coverage and turn-around-time for results. We discussed the implications of a negative, positive, carrier and/or variant of uncertain significant result. We recommended Ms. Grudzien pursue genetic testing for the multi-cancer gene panel+RNA.  The Multi-Gene Panel offered by [facility] includes  sequencing and/or deletion duplication testing of the following 84 genes: AIP, ALK, APC, ATM, AXIN2,BAP1,  BARD1, BLM, BMPR1A, BRCA1, BRCA2, BRIP1, CASR, CDC73, CDH1, CDK4, CDKN1B, CDKN1C, CDKN2A (p14ARF), CDKN2A (p16INK4a), CEBPA, CHEK2, CTNNA1, DICER1, DIS3L2, EGFR (c.2369C>T, p.Thr790Met variant only), EPCAM (Deletion/duplication testing only), FH, FLCN, GATA2, GPC3, GREM1 (Promoter region deletion/duplication testing only), HOXB13 (c.251G>A, p.Gly84Glu), HRAS, KIT, MAX, MEN1, MET, MITF (c.952G>A, p.Glu318Lys variant only), MLH1, MSH2, MSH3, MSH6, MUTYH, NBN, NF1, NF2, NTHL1, PALB2, PDGFRA, PHOX2B, PMS2, POLD1, POLE, POT1, PRKAR1A, PTCH1, PTEN, RAD50, RAD51C, RAD51D, RB1, RECQL4, RET, RUNX1, SDHAF2, SDHA (sequence changes only), SDHB, SDHC, SDHD, SMAD4, SMARCA4, SMARCB1, SMARCE1, STK11, SUFU, TERC, TERT, TMEM127, TP53, TSC1, TSC2, VHL, WRN and WT1.    Based on  Ms. Yokum family history of cancer, she meets medical criteria for genetic testing. Despite that she meets criteria, she may still have an out of pocket cost.   PLAN: After considering the risks, benefits, and limitations, Ms. Yeargan provided informed consent to pursue genetic testing and the blood sample was sent to [facility] for analysis of the multi-cancer gene panel. Results should be available within approximately 2-3 weeks' time, at which point they will be disclosed by telephone to Ms. Haydon, as will any additional recommendations warranted by these results. Ms. Birt will receive a summary of her genetic counseling visit and a copy of her results once available. This information will also be available in Epic.   Lastly, we encouraged Ms. Plate to remain in contact with cancer genetics annually so that we can continuously update the family history and inform her of any changes in cancer genetics and testing that may be of benefit for this family.   Ms. Petros questions were answered to her satisfaction  today. Our contact information was provided should additional questions or concerns arise. Thank you for the referral and allowing [place] to share in the care of your patient.   Gabriellia Rempel P. Florene Glen, [place], [facility] Licensed, Insurance risk surveyor [email or web address] phone: [phone]  The patient was seen for a total of 45 minutes in face-to-face genetic counseling.  This patient was discussed with Drs. Magrinat, Lindi Adie and/or Burr Medico who agrees with the above.    _______________________________________________________________________ For Office Staff:  Number of people involved in session: 3 Was an Intern/ student involved with case: no

## 2020-09-04 ENCOUNTER — Telehealth: Payer: Self-pay | Admitting: Genetic Counselor

## 2020-09-04 ENCOUNTER — Encounter: Payer: Self-pay | Admitting: Genetic Counselor

## 2020-09-04 ENCOUNTER — Ambulatory Visit: Payer: Self-pay | Admitting: Genetic Counselor

## 2020-09-04 DIAGNOSIS — Z1379 Encounter for other screening for genetic and chromosomal anomalies: Secondary | ICD-10-CM | POA: Insufficient documentation

## 2020-09-04 NOTE — Telephone Encounter (Signed)
Revealed negative genetic testing.  Discussed that we do not know why there is cancer in the family. It could be due to a different gene that we are not testing, or maybe our current technology may not be able to pick something up.  It will be important for her to keep in contact with genetics to keep up with whether additional testing may be needed.  There are two VUS but they will not change medical management.

## 2020-09-04 NOTE — Progress Notes (Signed)
HPI:  [name] was previously seen in the [place] clinic due to a family history of breast cancer and concerns regarding a hereditary predisposition to cancer. Please refer to our prior cancer genetics clinic note for more information regarding our discussion, assessment and recommendations, at the time. [name] recent genetic test results were disclosed to her, as were recommendations warranted by these results. These results and recommendations are discussed in more detail below.  CANCER HISTORY:  Oncology History   No history exists.    FAMILY HISTORY:  We obtained a detailed, 4-generation family history.  Significant diagnoses are listed below: Family History  Problem Relation Age of Onset  . Heart disease Mother   . Breast cancer Mother 84  . Stroke Father   . Heart disease Sister   . Asthma Daughter   . Cancer Other   . Diabetes Other   . Hypertension Other   . COPD Brother        "9/11 survivor"  . COPD Sister        "9/11 survivor"  . Breast cancer Maternal Grandmother        dx in her 65s  . Breast cancer Other        MGMs mother died in her 40s  . Colon cancer Neg Hx     The patient has two daughters and two sons who are cancer free.  She has two maternal half sisters and a maternal half brother who are cancer free currently, but one sister and her brother are being worked up for breast and prostate cancer, respectively.  Both parents are deceased.  There is no information on the paternal side of the family.  The patient's mother was diagnosed with breast cancer at age 22.  She has two sisters and two brothers who were cancer free.  Her mother was diagnosed with breast cancer in her 52's and her MGM was diagnosed with breast cancer in her 9's.  [name] is unaware of previous family history of genetic testing for hereditary cancer risks. Patient's maternal ancestors are of African American descent, and paternal ancestors are of  African American descent. There is no reported Ashkenazi Jewish ancestry. There is no known consanguinity.  GENETIC TEST RESULTS: Genetic testing reported out on September 02, 2020 through the Multi- cancer+RNA cancer panel found no pathogenic mutations. The Multi-Gene Panel offered by [facility] includes sequencing and/or deletion duplication testing of the following 84 genes: AIP, ALK, APC, ATM, AXIN2,BAP1,  BARD1, BLM, BMPR1A, BRCA1, BRCA2, BRIP1, CASR, CDC73, CDH1, CDK4, CDKN1B, CDKN1C, CDKN2A (p14ARF), CDKN2A (p16INK4a), CEBPA, CHEK2, CTNNA1, DICER1, DIS3L2, EGFR (c.2369C>T, p.Thr790Met variant only), EPCAM (Deletion/duplication testing only), FH, FLCN, GATA2, GPC3, GREM1 (Promoter region deletion/duplication testing only), HOXB13 (c.251G>A, p.Gly84Glu), HRAS, KIT, MAX, MEN1, MET, MITF (c.952G>A, p.Glu318Lys variant only), MLH1, MSH2, MSH3, MSH6, MUTYH, NBN, NF1, NF2, NTHL1, PALB2, PDGFRA, PHOX2B, PMS2, POLD1, POLE, POT1, PRKAR1A, PTCH1, PTEN, RAD50, RAD51C, RAD51D, RB1, RECQL4, RET, RUNX1, SDHAF2, SDHA (sequence changes only), SDHB, SDHC, SDHD, SMAD4, SMARCA4, SMARCB1, SMARCE1, STK11, SUFU, TERC, TERT, TMEM127, TP53, TSC1, TSC2, VHL, WRN and WT1. The test report has been scanned into EPIC and is located under the Molecular Pathology section of the Results Review tab.  A portion of the result report is included below for reference.     We discussed with [name] that because current genetic testing is not perfect, it is possible there may be a gene mutation in one of these genes that current testing cannot detect,  but that chance is small.  We also discussed, that there could be another gene that has not yet been discovered, or that we have not yet tested, that is responsible for the cancer diagnoses in the family. It is also possible there is a hereditary cause for the cancer in the family that [name] did not inherit and therefore was not identified in her testing.  Therefore, it is important to  remain in touch with cancer genetics in the future so that we can continue to offer [name] the most up to date genetic testing.   Genetic testing did identify two Variants of uncertain significance (VUS) - one in the EGFR gene called c.1916A>G (p.Asn639Ser), and a second in the RAD51C gene called c.890T>C (p.Leu297Pro).  At this time, it is unknown if these variants are associated with increased cancer risk or if they are normal findings, but most variants such as these get reclassified to being inconsequential. They should not be used to make medical management decisions. With time, we suspect the lab will determine the significance of these variants, if any. If we do learn more about them, we will try to contact [name] to discuss it further. However, it is important to stay in touch with us periodically and keep the address and phone number up to date.  ADDITIONAL GENETIC TESTING: We discussed with [name] that her genetic testing was fairly extensive.  If there are genes identified to increase cancer risk that can be analyzed in the future, we would be happy to discuss and coordinate this testing at that time.    CANCER SCREENING RECOMMENDATIONS: [name]'s test result is considered negative (normal).  This means that we have not identified a hereditary cause for her family history of breast cancer at this time. Most cancers happen by chance and this negative test suggests that her cancer may fall into this category.    While reassuring, this does not definitively rule out a hereditary predisposition to cancer. It is still possible that there could be genetic mutations that are undetectable by current technology. There could be genetic mutations in genes that have not been tested or identified to increase cancer risk.  Therefore, it is recommended she continue to follow the cancer management and screening guidelines provided by her primary healthcare provider.   An individual's  cancer risk and medical management are not determined by genetic test results alone. Overall cancer risk assessment incorporates additional factors, including personal medical history, family history, and any available genetic information that may result in a personalized plan for cancer prevention and surveillance  RECOMMENDATIONS FOR FAMILY MEMBERS:  Individuals in this family might be at some increased risk of developing cancer, over the general population risk, simply due to the family history of cancer.  We recommended women in this family have a yearly mammogram beginning at age 40, or 10 years younger than the earliest onset of cancer, an annual clinical breast exam, and perform monthly breast self-exams. Women in this family should also have a gynecological exam as recommended by their primary provider. All family members should be referred for colonoscopy starting at age 45.  FOLLOW-UP: Lastly, we discussed with [name] that cancer genetics is a rapidly advancing field and it is possible that new genetic tests will be appropriate for her and/or her family members in the future. We encouraged her to remain in contact with cancer genetics on an annual basis so we can update her personal and family histories and   let her know of advances in cancer genetics that may benefit this family.   Our contact number was provided. [name] questions were answered to her satisfaction, and she knows she is welcome to call us at anytime with additional questions or concerns.   Roma Kayser, [place], [facility] Licensed, Certified Genetic Counselor [email or web address]

## 2020-09-18 ENCOUNTER — Inpatient Hospital Stay (HOSPITAL_COMMUNITY)
Admission: EM | Admit: 2020-09-18 | Discharge: 2020-09-23 | DRG: 190 | Disposition: A | Discharge status: Home Health | Payer: Medicare Other | Attending: Family Medicine | Admitting: Family Medicine

## 2020-09-18 ENCOUNTER — Emergency Department (HOSPITAL_COMMUNITY): Payer: Medicare Other

## 2020-09-18 ENCOUNTER — Encounter (HOSPITAL_COMMUNITY): Payer: Self-pay

## 2020-09-18 DIAGNOSIS — E559 Vitamin D deficiency, unspecified: Secondary | ICD-10-CM | POA: Diagnosis present

## 2020-09-18 DIAGNOSIS — J90 Pleural effusion, not elsewhere classified: Secondary | ICD-10-CM | POA: Diagnosis present

## 2020-09-18 DIAGNOSIS — E43 Unspecified severe protein-calorie malnutrition: Secondary | ICD-10-CM | POA: Diagnosis present

## 2020-09-18 DIAGNOSIS — F419 Anxiety disorder, unspecified: Secondary | ICD-10-CM | POA: Diagnosis present

## 2020-09-18 DIAGNOSIS — Z888 Allergy status to other drugs, medicaments and biological substances status: Secondary | ICD-10-CM

## 2020-09-18 DIAGNOSIS — J8410 Pulmonary fibrosis, unspecified: Secondary | ICD-10-CM | POA: Diagnosis present

## 2020-09-18 DIAGNOSIS — R52 Pain, unspecified: Secondary | ICD-10-CM

## 2020-09-18 DIAGNOSIS — Z20822 Contact with and (suspected) exposure to covid-19: Secondary | ICD-10-CM | POA: Diagnosis present

## 2020-09-18 DIAGNOSIS — F32A Depression, unspecified: Secondary | ICD-10-CM | POA: Diagnosis present

## 2020-09-18 DIAGNOSIS — K219 Gastro-esophageal reflux disease without esophagitis: Secondary | ICD-10-CM | POA: Diagnosis present

## 2020-09-18 DIAGNOSIS — E162 Hypoglycemia, unspecified: Secondary | ICD-10-CM | POA: Diagnosis not present

## 2020-09-18 DIAGNOSIS — E871 Hypo-osmolality and hyponatremia: Secondary | ICD-10-CM | POA: Diagnosis present

## 2020-09-18 DIAGNOSIS — Z96641 Presence of right artificial hip joint: Secondary | ICD-10-CM | POA: Diagnosis present

## 2020-09-18 DIAGNOSIS — Z9851 Tubal ligation status: Secondary | ICD-10-CM

## 2020-09-18 DIAGNOSIS — J441 Chronic obstructive pulmonary disease with (acute) exacerbation: Principal | ICD-10-CM | POA: Diagnosis present

## 2020-09-18 DIAGNOSIS — H5462 Unqualified visual loss, left eye, normal vision right eye: Secondary | ICD-10-CM | POA: Diagnosis present

## 2020-09-18 DIAGNOSIS — D649 Anemia, unspecified: Secondary | ICD-10-CM | POA: Diagnosis present

## 2020-09-18 DIAGNOSIS — J440 Chronic obstructive pulmonary disease with acute lower respiratory infection: Secondary | ICD-10-CM | POA: Diagnosis present

## 2020-09-18 DIAGNOSIS — M810 Age-related osteoporosis without current pathological fracture: Secondary | ICD-10-CM | POA: Diagnosis present

## 2020-09-18 DIAGNOSIS — Z681 Body mass index (BMI) 19 or less, adult: Secondary | ICD-10-CM

## 2020-09-18 DIAGNOSIS — Z8249 Family history of ischemic heart disease and other diseases of the circulatory system: Secondary | ICD-10-CM | POA: Diagnosis not present

## 2020-09-18 DIAGNOSIS — J189 Pneumonia, unspecified organism: Secondary | ICD-10-CM | POA: Diagnosis present

## 2020-09-18 DIAGNOSIS — Z9981 Dependence on supplemental oxygen: Secondary | ICD-10-CM

## 2020-09-18 DIAGNOSIS — Z825 Family history of asthma and other chronic lower respiratory diseases: Secondary | ICD-10-CM

## 2020-09-18 DIAGNOSIS — D869 Sarcoidosis, unspecified: Secondary | ICD-10-CM | POA: Diagnosis present

## 2020-09-18 DIAGNOSIS — I10 Essential (primary) hypertension: Secondary | ICD-10-CM | POA: Diagnosis present

## 2020-09-18 DIAGNOSIS — Z9841 Cataract extraction status, right eye: Secondary | ICD-10-CM

## 2020-09-18 DIAGNOSIS — Z886 Allergy status to analgesic agent status: Secondary | ICD-10-CM

## 2020-09-18 DIAGNOSIS — Z880 Allergy status to penicillin: Secondary | ICD-10-CM

## 2020-09-18 DIAGNOSIS — K589 Irritable bowel syndrome without diarrhea: Secondary | ICD-10-CM | POA: Diagnosis present

## 2020-09-18 DIAGNOSIS — I4891 Unspecified atrial fibrillation: Secondary | ICD-10-CM | POA: Diagnosis present

## 2020-09-18 DIAGNOSIS — Z79899 Other long term (current) drug therapy: Secondary | ICD-10-CM

## 2020-09-18 DIAGNOSIS — Z87891 Personal history of nicotine dependence: Secondary | ICD-10-CM

## 2020-09-18 LAB — CBC WITH DIFFERENTIAL/PLATELET
Abs Immature Granulocytes: 0.04 10*3/uL (ref 0.00–0.07)
Basophils Absolute: 0 10*3/uL (ref 0.0–0.1)
Basophils Relative: 0 %
Eosinophils Absolute: 0 10*3/uL (ref 0.0–0.5)
Eosinophils Relative: 0 %
HCT: 27.8 % — ABNORMAL LOW (ref 36.0–46.0)
Hemoglobin: 9.6 g/dL — ABNORMAL LOW (ref 12.0–15.0)
Immature Granulocytes: 1 %
Lymphocytes Relative: 10 %
Lymphs Abs: 0.8 10*3/uL (ref 0.7–4.0)
MCH: 28.1 pg (ref 26.0–34.0)
MCHC: 34.5 g/dL (ref 30.0–36.0)
MCV: 81.3 fL (ref 80.0–100.0)
Monocytes Absolute: 0.6 10*3/uL (ref 0.1–1.0)
Monocytes Relative: 7 %
Neutro Abs: 6.6 10*3/uL (ref 1.7–7.7)
Neutrophils Relative %: 82 %
Platelets: 256 10*3/uL (ref 150–400)
RBC: 3.42 MIL/uL — ABNORMAL LOW (ref 3.87–5.11)
RDW: 15.8 % — ABNORMAL HIGH (ref 11.5–15.5)
WBC: 8.1 10*3/uL (ref 4.0–10.5)
nRBC: 0 % (ref 0.0–0.2)

## 2020-09-18 LAB — TRIGLYCERIDES: Triglycerides: 59 mg/dL (ref <150)

## 2020-09-18 LAB — COMPREHENSIVE METABOLIC PANEL
ALT: 42 U/L (ref 0–44)
AST: 104 U/L — ABNORMAL HIGH (ref 15–41)
Albumin: 2.9 g/dL — ABNORMAL LOW (ref 3.5–5.0)
Alkaline Phosphatase: 119 U/L (ref 38–126)
Anion gap: 18 — ABNORMAL HIGH (ref 5–15)
BUN: 18 mg/dL (ref 8–23)
CO2: 21 mmol/L — ABNORMAL LOW (ref 22–32)
Calcium: 8.7 mg/dL — ABNORMAL LOW (ref 8.9–10.3)
Chloride: 90 mmol/L — ABNORMAL LOW (ref 98–111)
Creatinine, Ser: 0.67 mg/dL (ref 0.44–1.00)
GFR, Estimated: >60 mL/min (ref >60)
Glucose, Bld: 50 mg/dL — ABNORMAL LOW (ref 70–99)
Potassium: 4.3 mmol/L (ref 3.5–5.1)
Sodium: 129 mmol/L — ABNORMAL LOW (ref 135–145)
Total Bilirubin: 1.6 mg/dL — ABNORMAL HIGH (ref 0.3–1.2)
Total Protein: 6.7 g/dL (ref 6.5–8.1)

## 2020-09-18 LAB — CBG MONITORING, ED
Glucose-Capillary: 70 mg/dL (ref 70–99)
Glucose-Capillary: 138 mg/dL — ABNORMAL HIGH (ref 70–99)
Glucose-Capillary: 28 mg/dL — CL (ref 70–99)
Glucose-Capillary: 140 mg/dL — ABNORMAL HIGH (ref 70–99)
Glucose-Capillary: 138 mg/dL — ABNORMAL HIGH (ref 70–99)
Glucose-Capillary: 143 mg/dL — ABNORMAL HIGH (ref 70–99)

## 2020-09-18 LAB — LACTATE DEHYDROGENASE: LDH: 241 U/L — ABNORMAL HIGH (ref 98–192)

## 2020-09-18 LAB — RESP PANEL BY RT-PCR (FLU A&B, COVID) ARPGX2
Influenza A by PCR: NEGATIVE
Influenza B by PCR: NEGATIVE
SARS Coronavirus 2 by RT PCR: NEGATIVE

## 2020-09-18 LAB — PROCALCITONIN: Procalcitonin: 0.19 ng/mL

## 2020-09-18 LAB — BRAIN NATRIURETIC PEPTIDE: B Natriuretic Peptide: 496.8 pg/mL — ABNORMAL HIGH (ref 0.0–100.0)

## 2020-09-18 LAB — LACTIC ACID, PLASMA
Lactic Acid, Venous: 1.1 mmol/L (ref 0.5–1.9)
Lactic Acid, Venous: 1.3 mmol/L (ref 0.5–1.9)

## 2020-09-18 LAB — C-REACTIVE PROTEIN: CRP: 1 mg/dL — ABNORMAL HIGH (ref <1.0)

## 2020-09-18 LAB — FERRITIN: Ferritin: 80 ng/mL (ref 11–307)

## 2020-09-18 LAB — FIBRINOGEN: Fibrinogen: 342 mg/dL (ref 210–475)

## 2020-09-18 LAB — D-DIMER, QUANTITATIVE: D-Dimer, Quant: 2 ug/mL-FEU — ABNORMAL HIGH (ref 0.00–0.50)

## 2020-09-18 MED ORDER — SERTRALINE HCL 50 MG PO TABS: 25.0000 mg | ORAL_TABLET | Freq: Every day | ORAL | Status: DC

## 2020-09-18 MED ORDER — MIRTAZAPINE 15 MG PO TBDP
15.0000 mg | ORAL_TABLET | Freq: Every day | ORAL | Status: DC
Start: 2020-09-18 — End: 2020-09-23
  Administered 2020-09-18 – 2020-09-22 (×5): 15 mg via ORAL
  Filled 2020-09-18 (×6): qty 1

## 2020-09-18 MED ORDER — HYDROCODONE-ACETAMINOPHEN 5-325 MG PO TABS: 1.0000 | ORAL_TABLET | Freq: Four times a day (QID) | ORAL | Status: DC | PRN

## 2020-09-18 MED ORDER — ALBUTEROL (5 MG/ML) CONTINUOUS INHALATION SOLN
10.0000 mg/h | INHALATION_SOLUTION | Freq: Once | RESPIRATORY_TRACT | Status: AC
  Administered 2020-09-18: 10 mg/h via RESPIRATORY_TRACT
  Filled 2020-09-18: qty 20

## 2020-09-18 MED ORDER — DOCUSATE SODIUM 100 MG PO CAPS
100.0000 mg | ORAL_CAPSULE | Freq: Two times a day (BID) | ORAL | Status: DC
  Administered 2020-09-18 – 2020-09-23 (×7): 100 mg via ORAL
  Filled 2020-09-18 (×9): qty 1

## 2020-09-18 MED ORDER — PANTOPRAZOLE SODIUM 40 MG PO TBEC
40.0000 mg | DELAYED_RELEASE_TABLET | Freq: Every day | ORAL | Status: DC
  Administered 2020-09-18 – 2020-09-23 (×6): 40 mg via ORAL
  Filled 2020-09-18 (×6): qty 1

## 2020-09-18 MED ORDER — METHYLPREDNISOLONE SODIUM SUCC 125 MG IJ SOLR
125.0000 mg | Freq: Once | INTRAMUSCULAR | Status: AC
  Administered 2020-09-18: 125 mg via INTRAVENOUS
  Filled 2020-09-18: qty 2

## 2020-09-18 MED ORDER — ALBUTEROL SULFATE HFA 108 (90 BASE) MCG/ACT IN AERS
2.0000 | INHALATION_SPRAY | RESPIRATORY_TRACT | Status: DC
  Administered 2020-09-18 (×2): 2 via RESPIRATORY_TRACT
  Filled 2020-09-18: qty 6.7

## 2020-09-18 MED ORDER — SODIUM CHLORIDE 0.9 % IV SOLN
1000.0000 mL | INTRAVENOUS | Status: DC
  Administered 2020-09-18: 1000 mL via INTRAVENOUS

## 2020-09-18 MED ORDER — LORATADINE 10 MG PO TABS
10.0000 mg | ORAL_TABLET | Freq: Every day | ORAL | Status: DC
  Administered 2020-09-18 – 2020-09-23 (×6): 10 mg via ORAL
  Filled 2020-09-18 (×6): qty 1

## 2020-09-18 MED ORDER — PREDNISONE 20 MG PO TABS
40.0000 mg | ORAL_TABLET | Freq: Every day | ORAL | Status: AC
  Administered 2020-09-20 – 2020-09-23 (×4): 40 mg via ORAL
  Filled 2020-09-18 (×4): qty 2

## 2020-09-18 MED ORDER — GABAPENTIN 100 MG PO CAPS
100.0000 mg | ORAL_CAPSULE | Freq: Every day | ORAL | Status: DC
  Administered 2020-09-18 – 2020-09-22 (×5): 100 mg via ORAL
  Filled 2020-09-18 (×5): qty 1

## 2020-09-18 MED ORDER — IOHEXOL 350 MG/ML SOLN
100.0000 mL | Freq: Once | INTRAVENOUS | Status: AC | PRN
  Administered 2020-09-18: 100 mL via INTRAVENOUS

## 2020-09-18 MED ORDER — ONDANSETRON HCL 4 MG PO TABS: 4.0000 mg | ORAL_TABLET | Freq: Four times a day (QID) | ORAL | Status: DC | PRN

## 2020-09-18 MED ORDER — SODIUM CHLORIDE 0.9 % IV SOLN
2.0000 g | Freq: Three times a day (TID) | INTRAVENOUS | Status: DC
  Administered 2020-09-18 – 2020-09-19 (×2): 2 g via INTRAVENOUS
  Filled 2020-09-18 (×3): qty 2

## 2020-09-18 MED ORDER — METHYLPREDNISOLONE SODIUM SUCC 125 MG IJ SOLR
60.0000 mg | Freq: Two times a day (BID) | INTRAMUSCULAR | Status: AC
  Administered 2020-09-19 (×2): 60 mg via INTRAVENOUS
  Filled 2020-09-18 (×2): qty 2

## 2020-09-18 MED ORDER — ACETAMINOPHEN 650 MG RE SUPP: 650.0000 mg | Freq: Four times a day (QID) | RECTAL | Status: DC | PRN

## 2020-09-18 MED ORDER — MELATONIN 5 MG PO TABS
10.0000 mg | ORAL_TABLET | Freq: Every day | ORAL | Status: DC
  Administered 2020-09-18 – 2020-09-22 (×5): 10 mg via ORAL
  Filled 2020-09-18 (×5): qty 2

## 2020-09-18 MED ORDER — SODIUM CHLORIDE 0.9 % IV SOLN
100.0000 mg | Freq: Two times a day (BID) | INTRAVENOUS | Status: DC
  Administered 2020-09-19 – 2020-09-23 (×10): 100 mg via INTRAVENOUS
  Filled 2020-09-18 (×10): qty 100

## 2020-09-18 MED ORDER — SODIUM CHLORIDE 0.9 % IV SOLN
100.0000 mg | Freq: Once | INTRAVENOUS | Status: AC
  Administered 2020-09-18: 100 mg via INTRAVENOUS
  Filled 2020-09-18: qty 100

## 2020-09-18 MED ORDER — SODIUM CHLORIDE 0.9 % IV SOLN: 1.0000 g | INTRAVENOUS | Status: DC

## 2020-09-18 MED ORDER — PRAVASTATIN SODIUM 40 MG PO TABS
40.0000 mg | ORAL_TABLET | Freq: Every day | ORAL | Status: DC
  Administered 2020-09-18 – 2020-09-22 (×5): 40 mg via ORAL
  Filled 2020-09-18 (×4): qty 1
  Filled 2020-09-18: qty 2

## 2020-09-18 MED ORDER — DEXTROSE 50 % IV SOLN
1.0000 | Freq: Once | INTRAVENOUS | Status: AC
  Administered 2020-09-18: 50 mL via INTRAVENOUS
  Filled 2020-09-18: qty 50

## 2020-09-18 MED ORDER — IPRATROPIUM-ALBUTEROL 0.5-2.5 (3) MG/3ML IN SOLN
3.0000 mL | Freq: Four times a day (QID) | RESPIRATORY_TRACT | Status: DC
  Administered 2020-09-18 – 2020-09-20 (×8): 3 mL via RESPIRATORY_TRACT
  Filled 2020-09-18 (×10): qty 3

## 2020-09-18 MED ORDER — SODIUM CHLORIDE 0.9 % IV SOLN
2.0000 g | Freq: Once | INTRAVENOUS | Status: AC
  Administered 2020-09-18: 2 g via INTRAVENOUS
  Filled 2020-09-18 (×2): qty 2

## 2020-09-18 MED ORDER — METOPROLOL SUCCINATE ER 50 MG PO TB24
50.0000 mg | ORAL_TABLET | Freq: Every day | ORAL | Status: DC
  Administered 2020-09-18 – 2020-09-23 (×6): 50 mg via ORAL
  Filled 2020-09-18 (×6): qty 1

## 2020-09-18 MED ORDER — IPRATROPIUM BROMIDE HFA 17 MCG/ACT IN AERS
2.0000 | INHALATION_SPRAY | Freq: Once | RESPIRATORY_TRACT | Status: AC
  Administered 2020-09-18: 2 via RESPIRATORY_TRACT
  Filled 2020-09-18: qty 12.9

## 2020-09-18 MED ORDER — DEXTROSE 5 % IV SOLN: Freq: Once | INTRAVENOUS | Status: AC

## 2020-09-18 MED ORDER — ACETAMINOPHEN 325 MG PO TABS
650.0000 mg | ORAL_TABLET | Freq: Four times a day (QID) | ORAL | Status: DC | PRN
  Administered 2020-09-20: 650 mg via ORAL
  Filled 2020-09-18: qty 2

## 2020-09-18 MED ORDER — SODIUM CHLORIDE 0.9 % IV SOLN
500.0000 mg | INTRAVENOUS | Status: DC
  Administered 2020-09-18: 500 mg via INTRAVENOUS
  Filled 2020-09-18: qty 500

## 2020-09-18 MED ORDER — ENOXAPARIN SODIUM 40 MG/0.4ML ~~LOC~~ SOLN
40.0000 mg | Freq: Every day | SUBCUTANEOUS | Status: DC
  Administered 2020-09-18 – 2020-09-23 (×6): 40 mg via SUBCUTANEOUS
  Filled 2020-09-18 (×6): qty 0.4

## 2020-09-18 MED ORDER — IPRATROPIUM-ALBUTEROL 0.5-2.5 (3) MG/3ML IN SOLN: 3.0000 mL | Freq: Four times a day (QID) | RESPIRATORY_TRACT | Status: DC

## 2020-09-18 MED ORDER — ONDANSETRON HCL 4 MG/2ML IJ SOLN: 4.0000 mg | Freq: Four times a day (QID) | INTRAMUSCULAR | Status: DC | PRN

## 2020-09-18 NOTE — ED Notes (Signed)
Unsuccessful attempt x 2 for IV stick and x 1 for blood draw. Another RN will attempt [place] IV with blood draw shortly.

## 2020-09-18 NOTE — ED Notes (Signed)
Pharmacy is verifying patient's meds and will adjust times since pt will be holding in the ED. Will give meds within the next 30 minutes once verification is done.

## 2020-09-18 NOTE — ED Provider Notes (Signed)
[facility] DEPT Provider Note   CSN: [id number] Arrival date & time: 09/18/20  5465     History Chief Complaint  Patient presents with  . Hypoglycemia  . Cough    [name] is a 75 y.o. female.  75 year old female presents with increased shortness of breath and cough x2 days.  Patient states that she has a history of COPD as well as sarcoid and chronically uses 2 L of oxygen and has had to increase this to 3 L.  Denies any lower extremity edema.  States she has had increased weakness and has had poor oral intake.  No focality to her weakness.  Denies any urinary symptoms.  Patient has not had her Covid booster.  EMS was called by family due to increasing lethargy.  Blood sugar found to be 45.  She has no history of diabetes and was given IV dextrose with rapid improvement.  Patient found to be tachypneic and placed on 4 L of oxygen and transported here.        Past Medical History:  Diagnosis Date  . Blind left eye   . Cataract of both eyes 01/18/2009   Right surgically repaired  . 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  . Depression 06/08/2007  . Essential hypertension, benign 06/08/2007  . Family history of breast cancer   . GERD 01/18/2009  . Osteoporosis 12/31/2012   Left femur had T score -2.5 on DEXA scan 02/26/2009.   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 Sarcoidosis 02/05/2009   PFTs 03/30/2009: FVC 67%, FEV1 48%, FEV1/FVC 52%, TLC 104%, DLCO 71%.  . Vitamin D deficiency     Patient Active Problem List   Diagnosis Date Noted  . Genetic testing 09/04/2020  . Family history of breast cancer   . Acute on chronic respiratory failure with hypoxia ([place]) 08/28/2019  . COPD exacerbation ([place]) 08/27/2019  . CAP (community acquired pneumonia) 08/06/2019  . Acute respiratory failure with hypoxia ([place])   . Shock ([place])   . Central venous catheter in  place   . AKI (acute kidney injury) ([place])   . Prolonged QT interval   . Pulmonary hypertension ([place])   . Hyponatremia   . Hypoglycemia   . Encephalopathy acute   . Severe sepsis ([place]) 05/31/2019  . Low bone mass 05/24/2018  . IBS (irritable bowel syndrome) 03/01/2018  . Diarrhea 12/22/2017  . Falls 12/26/2015  . Mixed incontinence urge and stress 12/26/2015  . Hypokalemia 10/03/2015  . Encounter for central line placement 09/12/2014  . Dyslipidemia 12/31/2012  . Allergic rhinitis 12/31/2012  . Osteoarthritis, hip, bilateral 11/01/2010  . Unintentional weight loss 03/19/2009  . 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 Sarcoidosis 02/05/2009  . GERD 01/18/2009  . Essential hypertension, benign 06/08/2007    Past Surgical History:  Procedure Laterality Date  . CATARACT EXTRACTION Right   . COLONOSCOPY  2002  . HIP ARTHROPLASTY Right   . TUBAL LIGATION       OB History   No obstetric history on file.     Family History  Problem Relation Age of Onset  . Heart disease Mother   . Breast cancer Mother 37  . Stroke Father   . Heart disease Sister   . Asthma Daughter   . Cancer Other   . Diabetes Other   . Hypertension Other   . COPD Brother        "  9/11 survivor"  . COPD Sister        "9/11 survivor"  . Breast cancer Maternal Grandmother        dx in her 35s  . Breast cancer Other        MGMs mother died in her 76s  . Colon cancer Neg Hx     Social History   Tobacco Use  . Smoking status: Former Smoker    Packs/day: 0.25    Years: 30.00    Pack years: 7.50    Quit date: 05/11/2010    Years since quitting: 10.3  . Smokeless tobacco: Never Used  Vaping Use  . Vaping Use: Never used  Substance Use Topics  . Alcohol use: Yes    Alcohol/week: 0.0 standard drinks    Comment: wine cooler.  . Drug use: No    Home Medications Prior to Admission medications   Medication Sig Start Date End Date Taking? Authorizing Provider  albuterol (PROVENTIL) (2.5 MG/3ML)  0.083% nebulizer solution Take 3 mLs (2.5 mg total) by nebulization every 6 (six) hours as needed. As needed for SOB 04/15/19   Isaac Bliss, Rayford Halsted, MD  albuterol (VENTOLIN HFA) 108 (90 Base) MCG/ACT inhaler Inhale 1-2 puffs into the lungs every 6 (six) hours as needed for wheezing or shortness of breath. 04/15/19 04/14/20  Isaac Bliss, Rayford Halsted, MD  amLODipine (NORVASC) 5 MG tablet Take 1 tablet (5 mg total) by mouth daily. 02/22/19 02/22/20  Erline Hau, MD  gabapentin (NEURONTIN) 100 MG capsule Take 100 mg by mouth at bedtime.    [provider]  hydrochlorothiazide (HYDRODIURIL) 25 MG tablet Take 25 mg by mouth daily.    [provider]  HYDROcodone-acetaminophen (NORCO/VICODIN) 5-325 MG tablet Take 1 tablet by mouth every 6 (six) hours as needed for moderate pain.    [provider]  ipratropium-albuterol (DUONEB) 0.5-2.5 (3) MG/3ML SOLN USE 3 ML VIA NEBULIZER EVERY 4 HOURS AS NEEDED Patient taking differently: Take 3 mLs by nebulization every 4 (four) hours as needed (sob).  06/18/18   Laurin Coder, MD  Magnesium 500 MG TABS Take 500 mg by mouth at bedtime.    [provider]  metoprolol succinate (TOPROL-XL) 50 MG 24 hr tablet TAKE 1 TABLET BY MOUTH EVERY DAY TAKE WITH OR IMMEDIATELY FOLLOWING A MEAL. Patient taking differently: Take 50 mg by mouth daily.  06/14/19   Isaac Bliss, Rayford Halsted, MD  mirtazapine (REMERON) 7.5 MG tablet Take 3.75 mg by mouth at bedtime. 07/20/19   [provider]  pantoprazole (PROTONIX) 40 MG tablet Take 1 tablet (40 mg total) by mouth daily. 02/22/19   Isaac Bliss, Rayford Halsted, MD  potassium citrate (UROCIT-K) 10 MEQ (1080 MG) SR tablet Take 10 mEq by mouth daily.    [provider]  pravastatin (PRAVACHOL) 40 MG tablet TAKE 1 TABLET BY MOUTH EVERY DAY IN THE EVENING Patient taking differently: Take 40 mg by mouth at bedtime.  02/22/19   Isaac Bliss, Rayford Halsted, MD  predniSONE  (DELTASONE) 10 MG tablet Take 4 tablets daily for 4 days then 3 tablets daily for the next 4 days and then 2 tablets daily for the following 4 days and then stay on 10 mg daily. 08/30/19   Georgette Shell, MD  sertraline (ZOLOFT) 25 MG tablet Take 25 mg by mouth at bedtime. 07/19/19   [provider]  Vitamin D, Ergocalciferol, (DRISDOL) 1.25 MG (50000 UT) CAPS capsule Take 50,000 Units by mouth once  a week. 05/06/19   [provider]    Allergies    Ace inhibitors, Lisinopril, Valsartan, Aspirin, and Penicillins  Review of Systems   Review of Systems  All other systems reviewed and are negative.   Physical Exam Updated Vital Signs BP (!) 146/65 (BP Location: Right Arm)   Pulse (!) 127   Temp 97.6 F (36.4 C) (Oral)   Resp (!) 33   SpO2 100%   Physical Exam Vitals and nursing note reviewed.  Constitutional:      General: She is not in acute distress.    Appearance: Normal appearance. She is well-developed and well-nourished. She is not toxic-appearing.  HENT:     Head: Normocephalic and atraumatic.  Eyes:     General: Lids are normal.     Extraocular Movements: EOM normal.     Conjunctiva/sclera: Conjunctivae normal.     Pupils: Pupils are equal, round, and reactive to light.  Neck:     Thyroid: No thyroid mass.     Trachea: No tracheal deviation.  Cardiovascular:     Rate and Rhythm: Regular rhythm. Tachycardia present.     Heart sounds: Normal heart sounds. No murmur heard. No gallop.   Pulmonary:     Effort: Tachypnea and respiratory distress present.     Breath sounds: No stridor. Examination of the right-upper field reveals decreased breath sounds. Examination of the left-upper field reveals decreased breath sounds. Decreased breath sounds present. No wheezing, rhonchi or rales.  Abdominal:     General: Bowel sounds are normal. There is no distension.     Palpations: Abdomen is soft.     Tenderness: There is no abdominal tenderness. There is no  CVA tenderness or rebound.  Musculoskeletal:        General: No tenderness or edema. Normal range of motion.     Cervical back: Normal range of motion and neck supple.  Skin:    General: Skin is warm and dry.     Findings: No abrasion or rash.  Neurological:     Mental Status: She is alert and oriented to person, place, and time.     GCS: GCS eye subscore is 4. GCS verbal subscore is 5. GCS motor subscore is 6.     Cranial Nerves: No cranial nerve deficit.     Sensory: No sensory deficit.     Deep Tendon Reflexes: Strength normal.  Psychiatric:        Mood and Affect: Mood and affect normal.        Speech: Speech normal.        Behavior: Behavior normal.     ED Results / Procedures / Treatments   Labs (all labs ordered are listed, but only abnormal results are displayed) Labs Reviewed  RESP PANEL BY RT-PCR (FLU A&B, COVID) ARPGX2  CULTURE, BLOOD (ROUTINE X 2)  CULTURE, BLOOD (ROUTINE X 2)  LACTIC ACID, PLASMA  LACTIC ACID, PLASMA  CBC WITH DIFFERENTIAL/PLATELET  COMPREHENSIVE METABOLIC PANEL  D-DIMER, QUANTITATIVE (NOT AT [facility])  PROCALCITONIN  LACTATE DEHYDROGENASE  FERRITIN  TRIGLYCERIDES  FIBRINOGEN  C-REACTIVE PROTEIN  CBG MONITORING, ED    EKG None  Radiology No results found.  Procedures Procedures   Medications Ordered in ED Medications  0.9 %  sodium chloride infusion (has no administration in time range)  albuterol (VENTOLIN HFA) 108 (90 Base) MCG/ACT inhaler 2 puff (has no administration in time range)  ipratropium (ATROVENT HFA) inhaler 2 puff (has no administration in time range)  ED Course  I have reviewed the triage vital signs and the nursing notes.  Pertinent labs & imaging results that were available during my care of the patient were reviewed by me and considered in my medical decision making (see chart for details).    MDM Rules/Calculators/A&P                         Patient kept on oxygen at 4 L.  Covid test negative here.   Treated for likely COPD with Solu-Medrol as well as albuterol therapy and does feel better.  D-dimer elevated and CT of the chest negative for PE but does show possible pneumonia.  Will start on IV antibiotics. Patient had recurrent episode of hypoglycemia here and treated with IV dextrose.  Blood sugar is now stabilized.  Plan is to admit to the hospitalist  CRITICAL CARE Performed by: Leota Jacobsen Total critical care time: 60 minutes Critical care time was exclusive of separately billable procedures and treating other patients. Critical care was necessary to treat or prevent imminent or life-threatening deterioration. Critical care was time spent personally by me on the following activities: development of treatment plan with patient and/or surrogate as well as nursing, discussions with consultants, evaluation of patient's response to treatment, examination of patient, obtaining history from patient or surrogate, ordering and performing treatments and interventions, ordering and review of laboratory studies, ordering and review of radiographic studies, pulse oximetry and re-evaluation of patient's condition.  Final Clinical Impression(s) / ED Diagnoses Final diagnoses:  None    Rx / DC Orders ED Discharge Orders    None       Lacretia Leigh, MD 09/18/20 1224

## 2020-09-18 NOTE — ED Notes (Signed)
[name], daughter, [phone] would like and update.

## 2020-09-18 NOTE — ED Notes (Signed)
Pt transported to CT, will give remaining meds when pt returns.

## 2020-09-18 NOTE — ED Notes (Signed)
MD at bedside. 

## 2020-09-18 NOTE — ED Triage Notes (Signed)
Pt presents via EMS with c/o hypoglycemia. Pt also has been c/o shortness of breath, cough, fever, chills for 3 days. Pt reports she has not been eating for 3 days. Family found her this morning unresponsive, CBG was 45 upon EMS arrival. Pt given 25 of Dextrose in route, pt is 100% alert and oriented at this time. Pt does have a hx of COPD, normally wears 2.5 liters of O2 at home.

## 2020-09-18 NOTE — H&P (Signed)
History and Physical    SABENA WINNER WUJ:[id number] DOB: 1946-03-18 DOA: 09/18/2020  PCP: Isaac Bliss, Rayford Halsted, MD  Patient coming from: Home  Chief Complaint: weakness, dyspnea  HPI: BELLE CHARLIE is a 75 y.o. female with medical history significant of COPD on chronic home O2 (2L [place]), sarcoidosis, HTN. Presentign with weakness and dyspnea. She reports that she's been feeling weak for the last 3 or 4 days. She's had cough, chills, poor appetite, and weakness during that time. She says she tried rest to help, but it didn't seem to make too much difference. She did not try any medicines. Apparently, this morning, her family found that she was lethargic. They called for EMS. When EMS arrived, the patient was found to be hypoglycemic. She was given dextrose and transported to the ED. She denies any other aggravating or alleviating factors.   ED Course: She was found to be tachycardic and dyspneic. O2 support was increased to 4L [place]. CTA PE was checked. It did not show a PE. However, it showed pulmonary fibrosis and possible atypical PNA. COVID was negative. Steroids were given. TRH was called for admission.   Review of Systems: Review of systems is otherwise negative for all not mentioned in HPI.   PMHx Past Medical History:  Diagnosis Date  . Blind left eye   . Cataract of both eyes 01/18/2009   Right surgically repaired  . COPD with emphysema ([place]) 02/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  . Depression 06/08/2007  . Essential hypertension, benign 06/08/2007  . Family history of breast cancer   . GERD 01/18/2009  . Osteoporosis 12/31/2012   Left femur had T score -2.5 on DEXA scan 02/26/2009.   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 Sarcoidosis 02/05/2009   PFTs 03/30/2009: FVC 67%, FEV1 48%, FEV1/FVC 52%, TLC 104%, DLCO 71%.  . Vitamin D deficiency     PSHx Past Surgical History:  Procedure Laterality Date  .  CATARACT EXTRACTION Right   . COLONOSCOPY  2002  . HIP ARTHROPLASTY Right   . TUBAL LIGATION      SocHx  reports that she quit smoking about 10 years ago. She has a 7.50 pack-year smoking history. She has never used smokeless tobacco. She reports current alcohol use. She reports that she does not use drugs.  Allergies  Allergen Reactions  . Ace Inhibitors Swelling  . Lisinopril Swelling    REACTION: Angioedema  . Valsartan Swelling    REACTION: angioedema.  Pt should not get ACEI or ARBs of any kind due to angioedema.  . Aspirin Other (See Comments)    REACTION: GI ulcer.  . Penicillins     Childhood allergy Has patient had a PCN reaction causing immediate rash, facial/tongue/throat swelling, SOB or lightheadedness with hypotension: Unknown Has patient had a PCN reaction causing severe rash involving mucus membranes or skin necrosis: Unknown Has patient had a PCN reaction that required hospitalization: Unknown Has patient had a PCN reaction occurring within the last 10 years: Unknown If all of the above answers are "NO", then may proceed with Cephalosporin use.     FamHx Family History  Problem Relation Age of Onset  . Heart disease Mother   . Breast cancer Mother 78  . Stroke Father   . Heart disease Sister   . Asthma Daughter   . Cancer Other   . Diabetes Other   . Hypertension Other   .  COPD Brother        "9/11 survivor"  . COPD Sister        "9/11 survivor"  . Breast cancer Maternal Grandmother        dx in her 59s  . Breast cancer Other        MGMs mother died in her 20s  . Colon cancer Neg Hx     Prior to Admission medications   Medication Sig Start Date End Date Taking? Authorizing Provider  albuterol (PROVENTIL) (2.5 MG/3ML) 0.083% nebulizer solution Take 3 mLs (2.5 mg total) by nebulization every 6 (six) hours as needed. As needed for SOB 04/15/19   Isaac Bliss, Rayford Halsted, MD  albuterol (VENTOLIN HFA) 108 (90 Base) MCG/ACT inhaler Inhale 1-2 puffs into  the lungs every 6 (six) hours as needed for wheezing or shortness of breath. 04/15/19 04/14/20  Isaac Bliss, Rayford Halsted, MD  amLODipine (NORVASC) 5 MG tablet Take 1 tablet (5 mg total) by mouth daily. 02/22/19 02/22/20  Isaac Bliss, Rayford Halsted, MD  celecoxib (CELEBREX) 50 MG capsule Take by mouth. 08/13/20   [provider]  famotidine (PEPCID) 20 MG tablet Take 20 mg by mouth 2 (two) times daily. 08/13/20   [provider]  gabapentin (NEURONTIN) 100 MG capsule Take 100 mg by mouth at bedtime.    [provider]  hydrochlorothiazide (HYDRODIURIL) 25 MG tablet Take 25 mg by mouth daily.    [provider]  HYDROcodone-acetaminophen (NORCO/VICODIN) 5-325 MG tablet Take 1 tablet by mouth every 6 (six) hours as needed for moderate pain.    [provider]  ipratropium-albuterol (DUONEB) 0.5-2.5 (3) MG/3ML SOLN USE 3 ML VIA NEBULIZER EVERY 4 HOURS AS NEEDED Patient taking differently: Take 3 mLs by nebulization every 4 (four) hours as needed (sob).  06/18/18   Olalere, Ernesto Rutherford, MD  loratadine (CLARITIN) 10 MG tablet Take 10 mg by mouth daily. 08/13/20   [provider]  Magnesium 500 MG TABS Take 500 mg by mouth at bedtime.    [provider]  metoprolol succinate (TOPROL-XL) 50 MG 24 hr tablet TAKE 1 TABLET BY MOUTH EVERY DAY TAKE WITH OR IMMEDIATELY FOLLOWING A MEAL. Patient taking differently: Take 50 mg by mouth daily.  06/14/19   Isaac Bliss, Rayford Halsted, MD  mirtazapine (REMERON) 15 MG tablet Take 15 mg by mouth at bedtime. 08/13/20   [provider]  mirtazapine (REMERON) 7.5 MG tablet Take 3.75 mg by mouth at bedtime. 07/20/19   [provider]  pantoprazole (PROTONIX) 40 MG tablet Take 1 tablet (40 mg total) by mouth daily. 02/22/19   Isaac Bliss, Rayford Halsted, MD  potassium citrate (UROCIT-K) 10 MEQ (1080 MG) SR tablet Take 10 mEq by mouth daily.    [provider]  pravastatin (PRAVACHOL) 40 MG tablet TAKE 1  TABLET BY MOUTH EVERY DAY IN THE EVENING Patient taking differently: Take 40 mg by mouth at bedtime.  02/22/19   Isaac Bliss, Rayford Halsted, MD  predniSONE (DELTASONE) 10 MG tablet Take 4 tablets daily for 4 days then 3 tablets daily for the next 4 days and then 2 tablets daily for the following 4 days and then stay on 10 mg daily. 08/30/19   Georgette Shell, MD  sertraline (ZOLOFT) 25 MG tablet Take 25 mg by mouth at bedtime. 07/19/19   [provider]  Vitamin D, Ergocalciferol, (DRISDOL) 1.25 MG (50000 UT) CAPS capsule Take 50,000 Units by mouth once a week. 05/06/19   [provider]  WIXELA INHUB 250-50 MCG/DOSE AEPB Inhale 1 puff into the lungs 2 (two) times daily. 08/30/20   [provider]    Physical Exam: Vitals:   09/18/20 1047 09/18/20 1100 09/18/20 1130 09/18/20 1215  BP:  103/73 128/67 102/64  Pulse:  (!) 117 (!) 123 (!) 103  Resp:  (!) 25 19 (!) 32  Temp:      TempSrc:      SpO2: 100% 100% 100% 90%    General: 74 y.o. female resting in bed in NAD Eyes: PERRL, normal sclera ENMT: Nares patent w/o discharge, orophaynx clear, dentition normal, ears w/o discharge/lesions/ulcers Neck: Supple, trachea midline Cardiovascular: tachy, +S1, S2, no g/r, 2/6 SEM, equal pulses throughout Respiratory: course, insp wheeze, increased WOB on 4L [place], breathless with speech GI: BS+, NDNT, no masses noted, no organomegaly noted MSK: No e/c/c Neuro: A&O x 3, no focal deficits Psyc: Appropriate interaction and affect, calm/cooperative  Labs on Admission: I have personally reviewed following labs and imaging studies  CBC: Recent Labs  Lab 09/18/20 0927  WBC 8.1  NEUTROABS 6.6  HGB 9.6*  HCT 27.8*  MCV 81.3  PLT 161   Basic Metabolic Panel: Recent Labs  Lab 09/18/20 0927  NA 129*  K 4.3  CL 90*  CO2 21*  GLUCOSE 50*  BUN 18  CREATININE 0.67  CALCIUM 8.7*   GFR: CrCl cannot be calculated (Unknown ideal weight.). Liver Function Tests: Recent  Labs  Lab 09/18/20 0927  AST 104*  ALT 42  ALKPHOS 119  BILITOT 1.6*  PROT 6.7  ALBUMIN 2.9*   No results for input(s): LIPASE, AMYLASE in the last 168 hours. No results for input(s): AMMONIA in the last 168 hours. Coagulation Profile: No results for input(s): INR, PROTIME in the last 168 hours. Cardiac Enzymes: No results for input(s): CKTOTAL, CKMB, CKMBINDEX, TROPONINI in the last 168 hours. BNP (last 3 results) No results for input(s): PROBNP in the last 8760 hours. HbA1C: No results for input(s): HGBA1C in the last 72 hours. CBG: Recent Labs  Lab 09/18/20 0825 09/18/20 1106 09/18/20 1128 09/18/20 1235  GLUCAP 70 28* 138* 140*   Lipid Profile: Recent Labs    09/18/20 0927  TRIG 59   Thyroid Function Tests: No results for input(s): TSH, T4TOTAL, FREET4, T3FREE, THYROIDAB in the last 72 hours. Anemia Panel: Recent Labs    09/18/20 0927  FERRITIN 80   Urine analysis:    Component Value Date/Time   COLORURINE YELLOW 06/01/2019 0116   APPEARANCEUR CLOUDY (A) 06/01/2019 0116   LABSPEC 1.017 06/01/2019 0116   PHURINE 5.0 06/01/2019 0116   GLUCOSEU NEGATIVE 06/01/2019 0116   GLUCOSEU NEG mg/dL 01/18/2009 1723   HGBUR NEGATIVE 06/01/2019 0116   BILIRUBINUR MODERATE (A) 06/01/2019 0116   KETONESUR NEGATIVE 06/01/2019 0116   PROTEINUR NEGATIVE 06/01/2019 0116   UROBILINOGEN 0.2 12/16/2010 1802   NITRITE NEGATIVE 06/01/2019 0116   LEUKOCYTESUR TRACE (A) 06/01/2019 0116    Radiological Exams on Admission: CT Angio Chest PE W/Cm &/Or Wo Cm  Result Date: 09/18/2020 CLINICAL DATA:  Shortness of breath and fever. History of sarcoidosis EXAM: CT ANGIOGRAPHY CHEST WITH CONTRAST TECHNIQUE: Multidetector CT imaging of the chest was performed using the standard protocol during bolus administration of intravenous contrast. Multiplanar CT image reconstructions and MIPs were obtained to evaluate the vascular anatomy. CONTRAST:  183mL OMNIPAQUE IOHEXOL 350 MG/ML SOLN  COMPARISON:  Chest CT August 28, 2019; chest radiograph September 18, 2020. Chest CT July 02, 2018 FINDINGS:  Cardiovascular: There is no demonstrable pulmonary embolus. No thoracic aortic aneurysm or dissection. There are scattered foci great vessel calcification. There are foci of aortic atherosclerosis as well as foci of coronary artery calcification. There is no pericardial effusion or pericardial thickening. Mediastinum/Nodes: Thyroid appears unremarkable. There are multiple calcified hilar and mediastinal lymph nodes, present previously and consistent with known changes of sarcoidosis. There is no frank adenopathy by size criteria. No esophageal lesions are appreciable. Lungs/Pleura: There is a focal right pleural effusion with a smaller pleural effusion on the left. Pleural effusions at this time are smaller than seen on the prior CT from 2021. There is extensive fibrosis throughout the lungs with a significant upper lobe prominence, a finding commonly associated with sarcoidosis. There is no well-defined edema. There are scattered areas of ill-defined airspace opacity in the lower lobe regions which are less prominent than on most recent CT but are slightly more prominent than on the 2019 study. Question a degree of atypical organism pneumonia superimposed on sarcoidosis changes. Note that a degree of pneumonia superimposed with the fibrosis which is present is difficult to exclude by imaging. Upper Abdomen: There is a persistent focal soft tissue opacity immediately adjacent to the stomach at the level of the crus of the left hemidiaphragm measuring 2.7 x 2.6 cm, stable compared to most recent study and smaller compared to prior CT from 2019. There is hepatic steatosis. Visualized upper abdominal structures otherwise appear unremarkable. Musculoskeletal: There are foci of degenerative change in the thoracic spine. There are no blastic or lytic bone lesions. No evident chest wall lesions. Review of the MIP  images confirms the above findings. IMPRESSION: 1. No demonstrable pulmonary embolus. No thoracic aortic aneurysm or dissection. There are foci of aortic atherosclerosis and foci of coronary artery calcification. 2. Extensive fibrotic change with upper lobe predominance, an appearance consistent with fibrosis secondary to longstanding sarcoidosis change. Pleural effusions bilaterally, smaller compared to most recent CT. Areas of ill-defined airspace opacity in the lower lobes are more prominent than on 2019 study but less prominent compared to 2021 study. It is possible that there is atypical organism pneumonia currently present superimposed on sarcoidosis change. Note that pneumonia superimposed on the fibrosis in the upper lobes could easily be obscured in this circumstance. The overall appearance of the lungs does warrant check of COVID-19 status at this time. 3. Extensive lymph node calcification consistent with prior sarcoidosis. No frank adenopathy currently. 4. Soft tissue opacity immediately adjacent the stomach has been present previously and is likely of benign etiology. Question lymph node enlargement secondary to chronic sarcoidosis. No new opacity of this nature seen elsewhere. Aortic Atherosclerosis (ICD10-I70.0). Electronically Signed   By: Lowella Grip III M.D.   On: 09/18/2020 12:14   DG Chest Port 1 View  Result Date: 09/18/2020 CLINICAL DATA:  Cough and weakness. EXAM: PORTABLE CHEST 1 VIEW COMPARISON:  CT chest 08/28/2019, chest radiographs 08/27/2019. FINDINGS: No substantial change in severe chronic lung disease with multifocal scarring and bilateral hilar retraction. Left greater than right basilar region are not substantially changed. Similar cardiomediastinal silhouette. Similar calcified mediastinal and hilar lymph nodes, consistent with sarcoidosis. Similar blunting of bilateral costophrenic sulci, representing small pleural effusions versus pleural scarring. IMPRESSION: 1.  Radiographically, no substantial change in left greater than right basilar opacities which could represent infection or edema. CT of the chest could further evaluate if clinically indicated. 2. Similar severe chronic lung disease with multifocal scarring, fibrosis and calcified mediastinal nodes, compatible with advanced sarcoidosis.  Electronically Signed   By: Margaretha Sheffield MD   On: 09/18/2020 10:15    EKG: Independently reviewed. A fib  Assessment/Plan Atypical PNA Pulmonary fibrosis Sarcoidosis COPD exacerbation w/ chronic O2 supplementation (2L [place])     - admit to inpt, tele     - reports an increase in sinus drainage, no fevers     - requiring increase in O2 supplementation (now 4L [place]) and still breathless with speech     - CTA PE clear of PE, but shows possible atypical PNA on background of pulmonary fibrosis     - continue duonebs, steroids     - change abx to doxy, cefepime; check sputum     - COVID is negative  Hypoglycemia Severe protein calorie malnutrition     - she reports poor po intake over the weekend     - glucose low at presentation but has recovered with fluids     - q4h glucose checks for 24hrs     - encourage diet     - dietician consult, palliative care consult  Hyponatremia     - chronically low, follow  Normocytic anemia     - no evidence of bleed, follow  Generalized weakness     - exam non-focal     - PT/OT consult  Anxiety     - continue zoloft  A fib     - I do not see a previous history; likely secondary to acute insult; not in RVR     - she is normally on metoprolol for HTN; continue this, place in tele  DVT prophylaxis: lovenox  Code Status: FULL  Family Communication: Attempted call to both dtrs listed in chart. Received VM only  Consults called: None   Status is: Inpatient  Remains inpatient appropriate because:Inpatient level of care appropriate due to severity of illness   Dispo: The patient is from: Home               Anticipated d/c is to: Home              Anticipated d/c date is: 2 days              Patient currently is not medically stable to d/c.   Difficult to place patient No  Time spent coordinating admission: 70 minutes  [facility]  If 7PM-7AM, please contact night-coverage [email or web address]  09/18/2020, 12:57 PM

## 2020-09-19 ENCOUNTER — Other Ambulatory Visit: Payer: Self-pay

## 2020-09-19 DIAGNOSIS — J441 Chronic obstructive pulmonary disease with (acute) exacerbation: Secondary | ICD-10-CM | POA: Diagnosis not present

## 2020-09-19 LAB — CBC
HCT: 27.9 % — ABNORMAL LOW (ref 36.0–46.0)
Hemoglobin: 9.5 g/dL — ABNORMAL LOW (ref 12.0–15.0)
MCH: 28.1 pg (ref 26.0–34.0)
MCHC: 34.1 g/dL (ref 30.0–36.0)
MCV: 82.5 fL (ref 80.0–100.0)
Platelets: 215 10*3/uL (ref 150–400)
RBC: 3.38 MIL/uL — ABNORMAL LOW (ref 3.87–5.11)
RDW: 15.9 % — ABNORMAL HIGH (ref 11.5–15.5)
WBC: 5.9 10*3/uL (ref 4.0–10.5)
nRBC: 0 % (ref 0.0–0.2)

## 2020-09-19 LAB — URINALYSIS, ROUTINE W REFLEX MICROSCOPIC
Bilirubin Urine: NEGATIVE
Glucose, UA: NEGATIVE mg/dL
Hgb urine dipstick: NEGATIVE
Ketones, ur: NEGATIVE mg/dL
Leukocytes,Ua: NEGATIVE
Nitrite: NEGATIVE
Protein, ur: NEGATIVE mg/dL
Specific Gravity, Urine: 1.009 (ref 1.005–1.030)
pH: 6 (ref 5.0–8.0)

## 2020-09-19 LAB — SODIUM, URINE, RANDOM: Sodium, Ur: 78 mmol/L

## 2020-09-19 LAB — COMPREHENSIVE METABOLIC PANEL
ALT: 34 U/L (ref 0–44)
AST: 63 U/L — ABNORMAL HIGH (ref 15–41)
Albumin: 3 g/dL — ABNORMAL LOW (ref 3.5–5.0)
Alkaline Phosphatase: 99 U/L (ref 38–126)
Anion gap: 10 (ref 5–15)
BUN: 10 mg/dL (ref 8–23)
CO2: 25 mmol/L (ref 22–32)
Calcium: 8.3 mg/dL — ABNORMAL LOW (ref 8.9–10.3)
Chloride: 91 mmol/L — ABNORMAL LOW (ref 98–111)
Creatinine, Ser: 0.64 mg/dL (ref 0.44–1.00)
GFR, Estimated: >60 mL/min (ref >60)
Glucose, Bld: 138 mg/dL — ABNORMAL HIGH (ref 70–99)
Potassium: 3.9 mmol/L (ref 3.5–5.1)
Sodium: 126 mmol/L — ABNORMAL LOW (ref 135–145)
Total Bilirubin: 1 mg/dL (ref 0.3–1.2)
Total Protein: 6.5 g/dL (ref 6.5–8.1)

## 2020-09-19 LAB — GLUCOSE, CAPILLARY
Glucose-Capillary: 103 mg/dL — ABNORMAL HIGH (ref 70–99)
Glucose-Capillary: 100 mg/dL — ABNORMAL HIGH (ref 70–99)
Glucose-Capillary: 112 mg/dL — ABNORMAL HIGH (ref 70–99)
Glucose-Capillary: 164 mg/dL — ABNORMAL HIGH (ref 70–99)

## 2020-09-19 LAB — BASIC METABOLIC PANEL
Anion gap: 9 (ref 5–15)
BUN: 10 mg/dL (ref 8–23)
CO2: 28 mmol/L (ref 22–32)
Calcium: 8.9 mg/dL (ref 8.9–10.3)
Chloride: 92 mmol/L — ABNORMAL LOW (ref 98–111)
Creatinine, Ser: 0.72 mg/dL (ref 0.44–1.00)
GFR, Estimated: >60 mL/min (ref >60)
Glucose, Bld: 128 mg/dL — ABNORMAL HIGH (ref 70–99)
Potassium: 4.8 mmol/L (ref 3.5–5.1)
Sodium: 129 mmol/L — ABNORMAL LOW (ref 135–145)

## 2020-09-19 LAB — CBG MONITORING, ED: Glucose-Capillary: 143 mg/dL — ABNORMAL HIGH (ref 70–99)

## 2020-09-19 LAB — OSMOLALITY: Osmolality: 272 mOsm/kg — ABNORMAL LOW (ref 275–295)

## 2020-09-19 MED ORDER — SODIUM CHLORIDE 0.9 % IV SOLN
2.0000 g | Freq: Two times a day (BID) | INTRAVENOUS | Status: DC
  Filled 2020-09-19: qty 2

## 2020-09-19 MED ORDER — ADULT MULTIVITAMIN W/MINERALS CH
1.0000 | ORAL_TABLET | Freq: Every day | ORAL | Status: DC
  Administered 2020-09-20 – 2020-09-23 (×4): 1 via ORAL
  Filled 2020-09-19 (×4): qty 1

## 2020-09-19 MED ORDER — ALBUTEROL SULFATE (2.5 MG/3ML) 0.083% IN NEBU: 2.5000 mg | INHALATION_SOLUTION | RESPIRATORY_TRACT | Status: DC | PRN

## 2020-09-19 MED ORDER — SODIUM CHLORIDE 0.9 % IV SOLN
1.0000 g | INTRAVENOUS | Status: DC
  Administered 2020-09-19 – 2020-09-22 (×4): 1 g via INTRAVENOUS
  Filled 2020-09-19 (×6): qty 10

## 2020-09-19 MED ORDER — ENSURE ENLIVE PO LIQD
237.0000 mL | Freq: Three times a day (TID) | ORAL | Status: DC
  Administered 2020-09-19 – 2020-09-23 (×7): 237 mL via ORAL

## 2020-09-19 NOTE — Evaluation (Signed)
Physical Therapy Evaluation Patient Details Name: [name] MRN: [id number] DOB: [birth year] Today's Date: 09/19/2020   History of Present Illness  75 yo female admitted with COPD exac. Hx of chronic respiratory failure-O2 dep, sarcoidosis, L eye blindness, recurrent R hip dislocation,, COPD, falls, pulmonary fibrosis  Clinical Impression  On eval, pt required Min assist for mobility. She was able to stand and take a few side steps along the side of the bed. Pt is unsteady and generally weak. She fatigues fairly easily. Remained on [place] O2. Dyspnea 2/4. Unable to attain a pulse ox reading during session. Discussed d/c plan-pt stated she will return home where she lives with her daughter. Will plan to follow and progress activity as tolerated.     Follow Up Recommendations Home health PT;Supervision for mobility/OOB    Equipment Recommendations  Rolling walker with 5" wheels    Recommendations for Other Services       Precautions / Restrictions Precautions Precaution Comments: O2 dep @ baseline Restrictions Weight Bearing Restrictions: No      Mobility  Bed Mobility Overal bed mobility: Needs Assistance Bed Mobility: Supine to Sit;Sit to Supine     Supine to sit: Supervision;HOB elevated Sit to supine: Supervision;HOB elevated   General bed mobility comments: Increased time.    Transfers Overall transfer level: Needs assistance Equipment used: Rolling walker (2 wheeled) Transfers: Sit to/from Stand Sit to Stand: Min guard         General transfer comment: Min guard for safety. Cues for safety, hand placement.  Ambulation/Gait Ambulation/Gait assistance: Min assist   Assistive device: Rolling walker (2 wheeled)       General Gait Details: side steps along side of bed with RW. Assist to steady pt and manage RW. Dyspnea 2/4. Remained on [place] O2-unable to get pulse ox reading.  Stairs            Wheelchair Mobility    Modified Rankin (Stroke Patients  Only)       Balance Overall balance assessment: Needs assistance;History of Falls         Standing balance support: Bilateral upper extremity supported Standing balance-Leahy Scale: Poor                               Pertinent Vitals/Pain Pain Assessment: Faces Faces Pain Scale: Hurts even more Pain Location: L side Pain Descriptors / Indicators: Sore;Discomfort Pain Intervention(s): Limited activity within patient's tolerance;Monitored during session;Repositioned    Home Living Family/patient expects to be discharged to:: Private residence Living Arrangements: Children Available Help at Discharge: Family Type of Home: House Home Access: Stairs to enter   Technical brewer of Steps: 2 Home Layout: One level Home Equipment: Cane - single point;Cane - quad;Bedside commode      Prior Function Level of Independence: Independent with assistive device(s)               Hand Dominance        Extremity/Trunk Assessment   Upper Extremity Assessment Upper Extremity Assessment: Generalized weakness    Lower Extremity Assessment Lower Extremity Assessment: Generalized weakness    Cervical / Trunk Assessment Cervical / Trunk Assessment: Normal  Communication   Communication: No difficulties  Cognition Arousal/Alertness: Awake/alert Behavior During Therapy: WFL for tasks assessed/performed Overall Cognitive Status: Within Functional Limits for tasks assessed  General Comments      Exercises     Assessment/Plan    PT Assessment Patient needs continued PT services  PT Problem List Decreased strength;Decreased mobility;Decreased activity tolerance;Decreased balance;Decreased knowledge of use of DME       PT Treatment Interventions DME instruction;Gait training;Therapeutic activities;Therapeutic exercise;Patient/family education;Balance training;Functional mobility training    PT  Goals (Current goals can be found in the Care Plan section)  Acute Rehab PT Goals Patient Stated Goal: to get better and go home PT Goal Formulation: With patient Time For Goal Achievement: 10/03/20 Potential to Achieve Goals: Good    Frequency Min 3X/week   Barriers to discharge        Co-evaluation               AM-PAC PT "6 Clicks" Mobility  Outcome Measure Help needed turning from your back to your side while in a flat bed without using bedrails?: A Little Help needed moving from lying on your back to sitting on the side of a flat bed without using bedrails?: A Little Help needed moving to and from a bed to a chair (including a wheelchair)?: A Little Help needed standing up from a chair using your arms (e.g., wheelchair or bedside chair)?: A Little Help needed to walk in hospital room?: A Little Help needed climbing 3-5 steps with a railing? : A Lot 6 Click Score: 17    End of Session Equipment Utilized During Treatment: Oxygen Activity Tolerance: Patient limited by fatigue Patient left: in bed;with call bell/phone within reach;with bed alarm set   PT Visit Diagnosis: Muscle weakness (generalized) (M62.81);Difficulty in walking, not elsewhere classified (R26.2)    Time: 0272-5366 PT Time Calculation (min) (ACUTE ONLY): 23 min   Charges:   PT Evaluation $PT Eval Moderate Complexity: 1 Mod PT Treatments $Gait Training: 8-22 mins           Doreatha Massed, PT Acute Rehabilitation  Office: [phone] Pager: [phone]

## 2020-09-19 NOTE — Progress Notes (Signed)
OT Cancellation Note  Patient Details Name: [name] MRN: [id number] DOB: [birth year]   Cancelled Treatment:    Reason Eval/Treat Not Completed: Patient at procedure or test/ unavailable. Currently in procedure with nursing staff in the room. Plan to reattempt.  Tyrone Schimke, OT Acute Rehabilitation Services Pager: [phone] Office: [phone]  09/19/2020, 1:12 PM

## 2020-09-19 NOTE — Plan of Care (Signed)

## 2020-09-19 NOTE — Progress Notes (Signed)
Initial Nutrition Assessment  RD working remotely.  DOCUMENTATION CODES:   Not applicable  INTERVENTION:  Will liberalize diet to regular.  Provide Ensure Enlive po TID, each supplement provides 350 kcal and 20 grams of protein.   Provide MVI po daily.  Monitor magnesium, potassium, and phosphorus daily for at least 3 days, MD to replete as needed, as pt is at risk for refeeding syndrome.  NUTRITION DIAGNOSIS:   Increased nutrient needs related to catabolic illness (sarcoidosis, COPD) as evidenced by estimated needs.  GOAL:   Patient will meet greater than or equal to 90% of their needs  MONITOR:   PO intake,Supplement acceptance,Labs,Weight trends,I & O's  REASON FOR ASSESSMENT:   Consult Assessment of nutrition requirement/status  ASSESSMENT:   75 year old female with PMHx of sarcoidosis, depression, COPD, GERD, osteoporosis, blindness in left eye, vitamin D deficiency admitted with atypical PNA, AECOPD, hypoglycemia, generalized weakness.   Attempted to call patient over the phone several times throughout the day but she was unable to answer. Per MD note patient reported poor PO intake over the weekend. No meal completion data available in chart at this time. Patient is at risk for malnutrition. Unable to determine if she meets criteria for malnutrition at this time. She would benefit from liberalized diet and oral nutrition supplements to help meet nutrient needs.  Reviewed weight history available in chart. Patient was 55.4 kg on 08/29/2019. She is currently 48.9 kg (107.81 lbs). That is a weight loss of 6.5 kg (11.7% body weight) over >1 year, which is not significant for time frame.   Medications reviewed and include: Colace 100 mg BID, Solu-Medrol 60 mg Q12hrs IV, Remeron 15 mg QHS, Protonix, ceftriaxone, doxycycline.   Labs reviewed: CBG 100-103, Sodium 129, Chloride 92.  Discussed with MD via secure chat and okay to liberalize to regular diet.  NUTRITION -  FOCUSED PHYSICAL EXAM:  Unable to complete at this time as RD is working remotely.  Diet Order:   Diet Order            Diet Heart Room service appropriate? Yes; Fluid consistency: Thin  Diet effective now                EDUCATION NEEDS:   No education needs have been identified at this time  Skin:  Skin Assessment: Reviewed RN Assessment  Last BM:  09/15/2020 per chart  Height:   Ht Readings from Last 1 Encounters:  09/19/20 5\' 3"  (1.6 m)   Weight:   Wt Readings from Last 1 Encounters:  09/19/20 48.9 kg   BMI:  Body mass index is 19.1 kg/m.  Estimated Nutritional Needs:   Kcal:  1450-1650  Protein:  75-85 grams  Fluid:  1.2-1.5 L/day  Jacklynn Barnacle, MS, RD, LDN Pager number available on Amion

## 2020-09-19 NOTE — Progress Notes (Addendum)
PROGRESS NOTE    [name]  GYK:[id number] DOB: Mar 13, 1946 DOA: 09/18/2020 PCP: [name]   Chief Complaint  Patient presents with  . Hypoglycemia  . Cough   Brief Narrative:  [name] is [name] 75 y.o. female with medical history significant of COPD on chronic home O2 (2L [place]), sarcoidosis, HTN who presented with weakness and SOB.  On day of admission was lethargic and found to be hypoglycemic.  She had imaging findings concerning for atypical pneumonia.  She's been admitted for COPD exacerbation with possible atypical pneumonia.    Assessment & Plan:   Active Problems:   COPD exacerbation (HCC)  Atypical PNA Pulmonary fibrosis Sarcoidosis COPD exacerbation w/ chronic O2 supplementation (2L [place])     - on 3 L      - continued diffuse wheezing today     - CTA PE without PE, but extensive fibrotic change with upper lobe predominance (c/w fibrosis 2/2 longstanding sarcoidosis change), bilateral pleural effusions (smaller than recent CT), ill defined air space opacity in the lower lobes.  Possible atypical pneumonia.   (see report)     - negative influenza, covid testing     - scheduled and prn nebs     - steroids      - doxy/ceftriaxone     - follow sputum cx     - continue duonebs, steroids     - change abx to doxy, cefepime; check sputum/urine strep/urine legionella  Hypoglycemia Severe protein calorie malnutrition     - she reports poor po intake over the weekend     - glucose low at presentation but has recovered with fluids     - BG's improved today, continue to follow off dextrose containing fluids  Hyponatremia     - worsened today, follow off IVF  Normocytic anemia     - no evidence of bleed, follow  Generalized weakness     - exam non-focal     - PT/OT consult  Anxiety     - continue zoloft  Concern for afib by admitting provider.  I don't see recorded [name] fib.  EKG with sinus tach and telemetry today with sinus rhythm as  well.  Checked with central tele as well who were in agreement.  Consider following outpatient with monitor if concerns.  DVT prophylaxis: lovenox Code Status:full  Family Communication: none at bedside - discussed with daughter, [name] Disposition:   Status is: Inpatient  Remains inpatient appropriate because:Inpatient level of care appropriate due to severity of illness   Dispo: The patient is from: Home              Anticipated d/c is to: pending              Anticipated d/c date is: > 3 days              Patient currently is not medically stable to d/c.   Difficult to place patient No   Consultants:   none  Procedures:  none  Antimicrobials: Anti-infectives (From admission, onward)   Start     Dose/Rate Route Frequency Ordered Stop   09/19/20 1800  ceFEPIme (MAXIPIME) 2 g in sodium chloride 0.9 % 100 mL IVPB  Status:  Discontinued        2 g 200 mL/hr over 30 Minutes Intravenous Every 12 hours 09/19/20 1033 09/19/20 1407   09/19/20 1500  cefTRIAXone (ROCEPHIN) 1 g in sodium chloride 0.9 % 100 mL IVPB  1 g 200 mL/hr over 30 Minutes Intravenous Every 24 hours 09/19/20 1407     09/19/20 0300  doxycycline (VIBRAMYCIN) 100 mg in sodium chloride 0.9 % 250 mL IVPB        100 mg 125 mL/hr over 120 Minutes Intravenous Every 12 hours 09/18/20 1241     09/18/20 2200  ceFEPIme (MAXIPIME) 2 g in sodium chloride 0.9 % 100 mL IVPB  Status:  Discontinued        2 g 200 mL/hr over 30 Minutes Intravenous Every 8 hours 09/18/20 1241 09/19/20 1033   09/18/20 1245  ceFEPIme (MAXIPIME) 2 g in sodium chloride 0.9 % 100 mL IVPB        2 g 200 mL/hr over 30 Minutes Intravenous  Once 09/18/20 1239 09/18/20 1802   09/18/20 1245  doxycycline (VIBRAMYCIN) 100 mg in sodium chloride 0.9 % 250 mL IVPB        100 mg 125 mL/hr over 120 Minutes Intravenous  Once 09/18/20 1239 09/18/20 1824   09/18/20 1230  cefTRIAXone (ROCEPHIN) 1 g in sodium chloride 0.9 % 100 mL IVPB  Status:  Discontinued         1 g 200 mL/hr over 30 Minutes Intravenous Every 24 hours 09/18/20 1225 09/18/20 1226   09/18/20 1230  azithromycin (ZITHROMAX) 500 mg in sodium chloride 0.9 % 250 mL IVPB  Status:  Discontinued        500 mg 250 mL/hr over 60 Minutes Intravenous Every 24 hours 09/18/20 1225 09/18/20 1320     Subjective: No new complaints Breathing feels better overall  Objective: Vitals:   09/19/20 0515 09/19/20 0550 09/19/20 0900 09/19/20 1117  BP: (!) 141/76 (!) 163/82 (!) 149/97 (!) 138/114  Pulse: 95 96 94 (!) 103  Resp: (!) 27 (!) 24 (!) 24 (!) 28  Temp: 97.6 F (36.4 C) 97.8 F (36.6 C)    TempSrc: Oral Oral    SpO2: 100% 100% 100% 100%  Weight:  48.9 kg    Height:  5\' 3"  (1.6 m)      Intake/Output Summary (Last 24 hours) at 09/19/2020 1402 Last data filed at 09/19/2020 0526 Gross per 24 hour  Intake 1654.88 ml  Output 600 ml  Net 1054.88 ml   Filed Weights   09/19/20 0247 09/19/20 0550  Weight: 50 kg 48.9 kg    Examination:  General exam: Appears calm and comfortable  Respiratory system: diffuse expiratory wheezing  Cardiovascular system: S1 & S2 heard, RRR.  Gastrointestinal system: Abdomen is nondistended, soft and nontender Central nervous system: Alert and oriented. No focal neurological deficits. Extremities: no LEE Skin: No rashes, lesions or ulcers Psychiatry: Judgement and insight appear normal. Mood & affect appropriate.     Data Reviewed: I have personally reviewed following labs and imaging studies  CBC: Recent Labs  Lab 09/18/20 0927 09/19/20 0613  WBC 8.1 5.9  NEUTROABS 6.6  --   HGB 9.6* 9.5*  HCT 27.8* 27.9*  MCV 81.3 82.5  PLT 256 570    Basic Metabolic Panel: Recent Labs  Lab 09/18/20 0927 09/19/20 0613  NA 129* 126*  K 4.3 3.9  CL 90* 91*  CO2 21* 25  GLUCOSE 50* 138*  BUN 18 10  CREATININE 0.67 0.64  CALCIUM 8.7* 8.3*    GFR: Estimated Creatinine Clearance: 47.6 mL/min (by C-G formula based on SCr of 0.64 mg/dL).  Liver  Function Tests: Recent Labs  Lab 09/18/20 0927 09/19/20 0613  AST 104* 63*  ALT 42 34  ALKPHOS 119 99  BILITOT 1.6* 1.0  PROT 6.7 6.5  ALBUMIN 2.9* 3.0*    CBG: Recent Labs  Lab 09/18/20 1544 09/18/20 2204 09/19/20 0411 09/19/20 0915 09/19/20 1112  GLUCAP 138* 143* 143* 103* 100*     Recent Results (from the past 240 hour(s))  Resp Panel by RT-PCR (Flu Khylan Sawyer&B, Covid) Nasopharyngeal Swab     Status: None   Collection Time: 09/18/20  8:46 AM   Specimen: Nasopharyngeal Swab; Nasopharyngeal(NP) swabs in vial transport medium  Result Value Ref Range Status   SARS Coronavirus 2 by RT PCR NEGATIVE NEGATIVE Final    Comment: (NOTE) SARS-CoV-2 target nucleic acids are NOT DETECTED.  The SARS-CoV-2 RNA is generally detectable in upper respiratory specimens during the acute phase of infection. The lowest concentration of SARS-CoV-2 viral copies this assay can detect is 138 copies/mL. Mariajose Mow negative result does not preclude SARS-Cov-2 infection and should not be used as the sole basis for treatment or other patient management decisions. Ivone Licht negative result may occur with  improper specimen collection/handling, submission of specimen other than nasopharyngeal swab, presence of viral mutation(s) within the areas targeted by this assay, and inadequate number of viral copies(<138 copies/mL). Ashaad Gaertner negative result must be combined with clinical observations, patient history, and epidemiological information. The expected result is Negative.  Fact Sheet for Patients:  EntrepreneurPulse.com.au  Fact Sheet for Healthcare Providers:  IncredibleEmployment.be  This test is no t yet approved or cleared by the [place] FDA and  has been authorized for detection and/or diagnosis of SARS-CoV-2 by FDA under an Emergency Use Authorization (EUA). This EUA will remain  in effect (meaning this test can be used) for the duration of the COVID-19 declaration under  Section 564(b)(1) of the Act, 21 U.S.C.section 360bbb-3(b)(1), unless the authorization is terminated  or revoked sooner.       Influenza Jolynne Spurgin by PCR NEGATIVE NEGATIVE Final   Influenza B by PCR NEGATIVE NEGATIVE Final    Comment: (NOTE) The Xpert Xpress SARS-CoV-2/FLU/RSV plus assay is intended as an aid in the diagnosis of influenza from Nasopharyngeal swab specimens and should not be used as Ilia Dimaano sole basis for treatment. Nasal washings and aspirates are unacceptable for Xpert Xpress SARS-CoV-2/FLU/RSV testing.  Fact Sheet for Patients: EntrepreneurPulse.com.au  Fact Sheet for Healthcare Providers: IncredibleEmployment.be  This test is not yet approved or cleared by the [place] FDA and has been authorized for detection and/or diagnosis of SARS-CoV-2 by FDA under an Emergency Use Authorization (EUA). This EUA will remain in effect (meaning this test can be used) for the duration of the COVID-19 declaration under Section 564(b)(1) of the Act, 21 U.S.C. section 360bbb-3(b)(1), unless the authorization is terminated or revoked.  Performed at [facility], [place] [address], [place], [place] [address]   Blood Culture (routine x 2)     Status: None (Preliminary result)   Collection Time: 09/18/20  8:50 AM   Specimen: BLOOD RIGHT HAND  Result Value Ref Range Status   Specimen Description   Final    BLOOD RIGHT HAND Performed at [facility] [address] [place], [place] [address]    Special Requests   Final    BOTTLES DRAWN AEROBIC AND ANAEROBIC Blood Culture results may not be optimal due to an inadequate volume of blood received in culture bottles Performed at [facility] [address] [place], [place] [address]    Culture   Final    NO GROWTH < 24 HOURS Performed  at [facility], [place] [address] [place], [place] [address]    Report Status PENDING  Incomplete  Blood  Culture (routine x 2)     Status: None (Preliminary result)   Collection Time: 09/18/20  9:27 AM   Specimen: BLOOD  Result Value Ref Range Status   Specimen Description   Final    BLOOD BLOOD RIGHT FOREARM Performed at [place] [address], [place], [place] [address]    Special Requests   Final    BOTTLES DRAWN AEROBIC AND ANAEROBIC Blood Culture results may not be optimal due to an inadequate volume of blood received in culture bottles Performed at [facility] [address], [place], [place] [address]    Culture   Final    NO GROWTH < 24 HOURS Performed at [facility] [address] [place], [place] [address]    Report Status PENDING  Incomplete         Radiology Studies: CT Angio Chest PE W/Cm &/Or Wo Cm  Result Date: 09/18/2020 CLINICAL DATA:  Shortness of breath and fever. History of sarcoidosis EXAM: CT ANGIOGRAPHY CHEST WITH CONTRAST TECHNIQUE: Multidetector CT imaging of the chest was performed using the standard protocol during bolus administration of intravenous contrast. Multiplanar CT image reconstructions and MIPs were obtained to evaluate the vascular anatomy. CONTRAST:  168mL OMNIPAQUE IOHEXOL 350 MG/ML SOLN COMPARISON:  Chest CT August 28, 2019; chest radiograph September 18, 2020. Chest CT July 02, 2018 FINDINGS: Cardiovascular: There is no demonstrable pulmonary embolus. No thoracic aortic aneurysm or dissection. There are scattered foci great vessel calcification. There are foci of aortic atherosclerosis as well as foci of coronary artery calcification. There is no pericardial effusion or pericardial thickening. Mediastinum/Nodes: Thyroid appears unremarkable. There are multiple calcified hilar and mediastinal lymph nodes, present previously and consistent with known changes of sarcoidosis. There is no frank adenopathy by size criteria. No esophageal lesions are appreciable. Lungs/Pleura: There is Marzelle Rutten focal right  pleural effusion with Kasey Ewings smaller pleural effusion on the left. Pleural effusions at this time are smaller than seen on the prior CT from 2021. There is extensive fibrosis throughout the lungs with Gwyn Mehring significant upper lobe prominence, Nuria Phebus finding commonly associated with sarcoidosis. There is no well-defined edema. There are scattered areas of ill-defined airspace opacity in the lower lobe regions which are less prominent than on most recent CT but are slightly more prominent than on the 2019 study. Question Glenroy Crossen degree of atypical organism pneumonia superimposed on sarcoidosis changes. Note that Canon Gola degree of pneumonia superimposed with the fibrosis which is present is difficult to exclude by imaging. Upper Abdomen: There is Ashonte Angelucci persistent focal soft tissue opacity immediately adjacent to the stomach at the level of the crus of the left hemidiaphragm measuring 2.7 x 2.6 cm, stable compared to most recent study and smaller compared to prior CT from 2019. There is hepatic steatosis. Visualized upper abdominal structures otherwise appear unremarkable. Musculoskeletal: There are foci of degenerative change in the thoracic spine. There are no blastic or lytic bone lesions. No evident chest wall lesions. Review of the MIP images confirms the above findings. IMPRESSION: 1. No demonstrable pulmonary embolus. No thoracic aortic aneurysm or dissection. There are foci of aortic atherosclerosis and foci of coronary artery calcification. 2. Extensive fibrotic change with upper lobe predominance, an appearance consistent with fibrosis secondary to longstanding sarcoidosis change. Pleural effusions bilaterally, smaller compared to most recent CT. Areas of ill-defined airspace opacity in  the lower lobes are more prominent than on 2019 study but less prominent compared to 2021 study. It is possible that there is atypical organism pneumonia currently present superimposed on sarcoidosis change. Note that pneumonia superimposed on the fibrosis  in the upper lobes could easily be obscured in this circumstance. The overall appearance of the lungs does warrant check of COVID-19 status at this time. 3. Extensive lymph node calcification consistent with prior sarcoidosis. No frank adenopathy currently. 4. Soft tissue opacity immediately adjacent the stomach has been present previously and is likely of benign etiology. Question lymph node enlargement secondary to chronic sarcoidosis. No new opacity of this nature seen elsewhere. Aortic Atherosclerosis (ICD10-I70.0). Electronically Signed   By: Lowella Grip III M.D.   On: 09/18/2020 12:14   DG Chest Port 1 View  Result Date: 09/18/2020 CLINICAL DATA:  Cough and weakness. EXAM: PORTABLE CHEST 1 VIEW COMPARISON:  CT chest 08/28/2019, chest radiographs 08/27/2019. FINDINGS: No substantial change in severe chronic lung disease with multifocal scarring and bilateral hilar retraction. Left greater than right basilar region are not substantially changed. Similar cardiomediastinal silhouette. Similar calcified mediastinal and hilar lymph nodes, consistent with sarcoidosis. Similar blunting of bilateral costophrenic sulci, representing small pleural effusions versus pleural scarring. IMPRESSION: 1. Radiographically, no substantial change in left greater than right basilar opacities which could represent infection or edema. CT of the chest could further evaluate if clinically indicated. 2. Similar severe chronic lung disease with multifocal scarring, fibrosis and calcified mediastinal nodes, compatible with advanced sarcoidosis. Electronically Signed   By: Margaretha Sheffield [name]   On: 09/18/2020 10:15        Scheduled Meds: . docusate sodium  100 mg Oral BID  . enoxaparin (LOVENOX) injection  40 mg Subcutaneous Daily  . gabapentin  100 mg Oral QHS  . ipratropium-albuterol  3 mL Nebulization Q6H  . loratadine  10 mg Oral Daily  . melatonin  10 mg Oral QHS  . methylPREDNISolone (SOLU-MEDROL) injection  60  mg Intravenous Q12H   Followed by  . [START ON 09/20/2020] predniSONE  40 mg Oral Q breakfast  . metoprolol succinate  50 mg Oral Daily  . mirtazapine  15 mg Oral QHS  . pantoprazole  40 mg Oral Daily  . pravastatin  40 mg Oral QHS   Continuous Infusions: . ceFEPime (MAXIPIME) IV    . doxycycline (VIBRAMYCIN) IV 100 mg (09/19/20 0252)     LOS: 1 day    Time spent: over 30 min    Fayrene Helper, [name]   To contact the attending provider between 7A-7P or the covering provider during after hours 7P-7A, please log into the web site [email or web address] and access using universal [place] password for that web site. If you do not have the password, please call the hospital operator.  09/19/2020, 2:02 PM

## 2020-09-19 NOTE — Progress Notes (Signed)
PHARMACY NOTE:  ANTIMICROBIAL RENAL DOSAGE ADJUSTMENT  Current antimicrobial regimen includes a mismatch between antimicrobial dosage and estimated renal function.  As per policy approved by the Pharmacy & Therapeutics and Medical Executive Committees, the antimicrobial dosage will be adjusted accordingly.  Current antimicrobial dosage:  Cefepime 2gm q8 Indication: PNA  Renal Function:   Estimated Creatinine Clearance: 47.6 mL/min (by C-G formula based on SCr of 0.64 mg/dL). []      On intermittent HD, scheduled: []      On CRRT    Antimicrobial dosage has been changed to:  Cefepime 2gm q12   Additional Comments: concurrent Doxycycline needs no dose adj for renal fx   Thank you for allowing pharmacy to be a part of this patient's care.  Minda Ditto PharmD 09/19/2020 10:35 AM

## 2020-09-19 NOTE — ED Notes (Signed)
Unsuccessfully attempted to obtain 0500 prior to transport.  Will notified RN.

## 2020-09-20 DIAGNOSIS — J441 Chronic obstructive pulmonary disease with (acute) exacerbation: Secondary | ICD-10-CM | POA: Diagnosis not present

## 2020-09-20 LAB — CBC WITH DIFFERENTIAL/PLATELET
Abs Immature Granulocytes: 0.03 10*3/uL (ref 0.00–0.07)
Basophils Absolute: 0 10*3/uL (ref 0.0–0.1)
Basophils Relative: 0 %
Eosinophils Absolute: 0 10*3/uL (ref 0.0–0.5)
Eosinophils Relative: 0 %
HCT: 27.6 % — ABNORMAL LOW (ref 36.0–46.0)
Hemoglobin: 9.1 g/dL — ABNORMAL LOW (ref 12.0–15.0)
Immature Granulocytes: 1 %
Lymphocytes Relative: 7 %
Lymphs Abs: 0.4 10*3/uL — ABNORMAL LOW (ref 0.7–4.0)
MCH: 28.1 pg (ref 26.0–34.0)
MCHC: 33 g/dL (ref 30.0–36.0)
MCV: 85.2 fL (ref 80.0–100.0)
Monocytes Absolute: 0.2 10*3/uL (ref 0.1–1.0)
Monocytes Relative: 4 %
Neutro Abs: 4.4 10*3/uL (ref 1.7–7.7)
Neutrophils Relative %: 88 %
Platelets: 196 10*3/uL (ref 150–400)
RBC: 3.24 MIL/uL — ABNORMAL LOW (ref 3.87–5.11)
RDW: 16 % — ABNORMAL HIGH (ref 11.5–15.5)
WBC: 5 10*3/uL (ref 4.0–10.5)
nRBC: 0 % (ref 0.0–0.2)

## 2020-09-20 LAB — COMPREHENSIVE METABOLIC PANEL
ALT: 30 U/L (ref 0–44)
AST: 49 U/L — ABNORMAL HIGH (ref 15–41)
Albumin: 2.8 g/dL — ABNORMAL LOW (ref 3.5–5.0)
Alkaline Phosphatase: 91 U/L (ref 38–126)
Anion gap: 11 (ref 5–15)
BUN: 12 mg/dL (ref 8–23)
CO2: 28 mmol/L (ref 22–32)
Calcium: 8.5 mg/dL — ABNORMAL LOW (ref 8.9–10.3)
Chloride: 91 mmol/L — ABNORMAL LOW (ref 98–111)
Creatinine, Ser: 0.68 mg/dL (ref 0.44–1.00)
GFR, Estimated: >60 mL/min (ref >60)
Glucose, Bld: 162 mg/dL — ABNORMAL HIGH (ref 70–99)
Potassium: 3.7 mmol/L (ref 3.5–5.1)
Sodium: 130 mmol/L — ABNORMAL LOW (ref 135–145)
Total Bilirubin: 0.5 mg/dL (ref 0.3–1.2)
Total Protein: 6.2 g/dL — ABNORMAL LOW (ref 6.5–8.1)

## 2020-09-20 LAB — GLUCOSE, CAPILLARY
Glucose-Capillary: 119 mg/dL — ABNORMAL HIGH (ref 70–99)
Glucose-Capillary: 134 mg/dL — ABNORMAL HIGH (ref 70–99)
Glucose-Capillary: 148 mg/dL — ABNORMAL HIGH (ref 70–99)
Glucose-Capillary: 153 mg/dL — ABNORMAL HIGH (ref 70–99)
Glucose-Capillary: 165 mg/dL — ABNORMAL HIGH (ref 70–99)
Glucose-Capillary: 197 mg/dL — ABNORMAL HIGH (ref 70–99)
Glucose-Capillary: 95 mg/dL (ref 70–99)

## 2020-09-20 LAB — PHOSPHORUS: Phosphorus: 1.4 mg/dL — ABNORMAL LOW (ref 2.5–4.6)

## 2020-09-20 LAB — OSMOLALITY, URINE: Osmolality, Ur: 331 mOsm/kg (ref 300–900)

## 2020-09-20 LAB — MAGNESIUM: Magnesium: 1.6 mg/dL — ABNORMAL LOW (ref 1.7–2.4)

## 2020-09-20 LAB — STREP PNEUMONIAE URINARY ANTIGEN: Strep Pneumo Urinary Antigen: NEGATIVE

## 2020-09-20 MED ORDER — AMLODIPINE BESYLATE 5 MG PO TABS
5.0000 mg | ORAL_TABLET | Freq: Every day | ORAL | Status: DC
  Administered 2020-09-20 – 2020-09-23 (×4): 5 mg via ORAL
  Filled 2020-09-20 (×4): qty 1

## 2020-09-20 MED ORDER — K PHOS MONO-SOD PHOS DI & MONO 155-852-130 MG PO TABS
500.0000 mg | ORAL_TABLET | Freq: Four times a day (QID) | ORAL | Status: DC
  Administered 2020-09-20 (×3): 500 mg via ORAL
  Filled 2020-09-20 (×4): qty 2

## 2020-09-20 MED ORDER — MAGNESIUM SULFATE 2 GM/50ML IV SOLN
2.0000 g | Freq: Once | INTRAVENOUS | Status: AC
  Administered 2020-09-20: 2 g via INTRAVENOUS
  Filled 2020-09-20: qty 50

## 2020-09-20 MED ORDER — IPRATROPIUM-ALBUTEROL 0.5-2.5 (3) MG/3ML IN SOLN
3.0000 mL | Freq: Three times a day (TID) | RESPIRATORY_TRACT | Status: DC
  Administered 2020-09-21 – 2020-09-22 (×5): 3 mL via RESPIRATORY_TRACT
  Filled 2020-09-20 (×4): qty 3

## 2020-09-20 NOTE — Evaluation (Signed)
Occupational Therapy Evaluation Patient Details Name: [name] MRN: [id number] DOB: [birth year] Today's Date: 09/20/2020    History of Present Illness 75 yo female admitted with COPD exac. Hx of chronic respiratory failure-O2 dep, sarcoidosis, L eye blindness, recurrent R hip dislocation,, COPD, falls, pulmonary fibrosis   Clinical Impression   Patient's daughter lives with her in single level house with 2 STE. At baseline patient reports I with ADLs, has assist from DTR when getting in/out of tub and uses SPC for ambulation "when I remember it." Pt endorses hx of falls "I get moving too fast because I think I'm younger than I am." Pt demonstrate functional UE strength to perform transfers and ADLs at min G level for safety due to mild unsteadiness. Pt does have R shoulder ROM deficits, states has been this way since a PNA shot in October. Recommend continued acute OT services to maximize patient endurance and safety in order to facilitate D/C home with DTR.    Follow Up Recommendations  Supervision/Assistance - 24 hour    Equipment Recommendations  None recommended by OT       Precautions / Restrictions Precautions Precautions: Fall Precaution Comments: O2 dep @ baseline Restrictions Weight Bearing Restrictions: No      Mobility Bed Mobility Overal bed mobility: Modified Independent                  Transfers Overall transfer level: Needs assistance Equipment used: None Transfers: Sit to/from Stand Sit to Stand: Min guard         General transfer comment: min G for safety    Balance Overall balance assessment: Needs assistance;History of Falls Sitting-balance support: Feet supported Sitting balance-Leahy Scale: Good     Standing balance support: No upper extremity supported Standing balance-Leahy Scale: Fair Standing balance comment: patient able to perform peri care in standing at min G assist level for safety                            ADL either performed or assessed with clinical judgement   ADL Overall ADL's : Needs assistance/impaired Eating/Feeding: Set up;Sitting Eating/Feeding Details (indicate cue type and reason): to open containers Grooming: Wash/dry face;Wash/dry hands;Set up;Sitting   Upper Body Bathing: Set up;Sitting   Lower Body Bathing: Sit to/from stand;Min guard   Upper Body Dressing : Set up;Sitting   Lower Body Dressing: Set up;Min guard;Sitting/lateral leans;Sit to/from stand Lower Body Dressing Details (indicate cue type and reason): patient is able to don socks while in bed with increased time, min G in standing for balance/safety Toilet Transfer: Min guard;Ambulation;BSC Toilet Transfer Details (indicate cue type and reason): min G for safety as patient is mildly unsteady "I'm doing better than yesterday" able to take few steps from [facility] to recliner Toileting- Clothing Manipulation and Hygiene: Min guard;Sit to/from stand Toileting - Clothing Manipulation Details (indicate cue type and reason): min G for steadying assist, patient able to perform peri care and clothing management without physical assistance     Functional mobility during ADLs: Min guard General ADL Comments: patient appears close to her baseline, reports feeling much better today than yesterday. overall min G for safety due to mild unsteadiness     Vision Patient Visual Report: Other (comment) (pt report blind in L eye)              Pertinent Vitals/Pain Pain Assessment: Faces Faces Pain Scale: Hurts little more Pain Location: R shoulder, L flank  Pain Descriptors / Indicators: Sore;Discomfort Pain Intervention(s): Monitored during session     Hand Dominance Right   Extremity/Trunk Assessment Upper Extremity Assessment Upper Extremity Assessment: RUE deficits/detail RUE Deficits / Details: unable to flex shoulder more than ~ 50 degrees, reports ever since PNA shot in October has had limited shoulder ROM. B grip  equal 3+/5   Lower Extremity Assessment Lower Extremity Assessment: Defer to PT evaluation   Cervical / Trunk Assessment Cervical / Trunk Assessment: Normal   Communication Communication Communication: No difficulties   Cognition Arousal/Alertness: Awake/alert Behavior During Therapy: WFL for tasks assessed/performed Overall Cognitive Status: Within Functional Limits for tasks assessed                                                Home Living Family/patient expects to be discharged to:: Private residence Living Arrangements: Children Available Help at Discharge: Family Type of Home: House Home Access: Stairs to enter Technical brewer of Steps: 2   Home Layout: One level     Bathroom Shower/Tub: Walk-in shower;Tub/shower unit   Bathroom Toilet: Standard     Home Equipment: Cane - single point;Bedside commode;Walker - 2 wheels;Shower seat;Other (comment) (shower chair too big for tub/bath)          Prior Functioning/Environment Level of Independence: Independent with assistive device(s)        Comments: pt states DTR helps her in/out of tub when she wants to take a bath, otherwise I        OT Problem List: Decreased activity tolerance;Decreased range of motion;Impaired balance (sitting and/or standing)      OT Treatment/Interventions: Self-care/ADL training;Therapeutic exercise;Therapeutic activities;Patient/family education;Balance training    OT Goals(Current goals can be found in the care plan section) Acute Rehab OT Goals Patient Stated Goal: to get better and go home OT Goal Formulation: With patient Time For Goal Achievement: 10/04/20 Potential to Achieve Goals: Good  OT Frequency: Min 2X/week    AM-PAC OT "6 Clicks" Daily Activity     Outcome Measure Help from another person eating meals?: A Little Help from another person taking care of personal grooming?: A Little Help from another person toileting, which includes using  toliet, bedpan, or urinal?: A Little Help from another person bathing (including washing, rinsing, drying)?: A Little Help from another person to put on and taking off regular upper body clothing?: A Little Help from another person to put on and taking off regular lower body clothing?: A Little 6 Click Score: 18   End of Session Equipment Utilized During Treatment: Oxygen Nurse Communication: Mobility status  Activity Tolerance: Patient tolerated treatment well Patient left: in chair;with call bell/phone within reach;with chair alarm set  OT Visit Diagnosis: Unsteadiness on feet (R26.81)                Time: 8177-1165 OT Time Calculation (min): 32 min Charges:  OT General Charges $OT Visit: 1 Visit OT Evaluation $OT Eval Low Complexity: 1 Low OT Treatments $Self Care/Home Management : 8-22 mins  Delbert Phenix OT OT pager: [place] 09/20/2020, 10:53 AM

## 2020-09-20 NOTE — Progress Notes (Addendum)
PROGRESS NOTE    [name]  YJE:[id number] DOB: [birth year] DOA: 09/18/2020 PCP: Isaac Bliss, Rayford Halsted, MD   Chief Complaint  Patient presents with  . Hypoglycemia  . Cough   Brief Narrative:  [name] is [name] 75 y.o. female with medical history significant of COPD on chronic home O2 (2L [place]), sarcoidosis, HTN who presented with weakness and SOB.  On day of admission was lethargic and found to be hypoglycemic.  She had imaging findings concerning for atypical pneumonia.  She's been admitted for COPD exacerbation with possible atypical pneumonia.    Assessment & Plan:   Active Problems:   COPD exacerbation (HCC)  Atypical PNA Pulmonary fibrosis Sarcoidosis COPD exacerbation w/ chronic O2 supplementation (2L [place])     - on 3 L [place]     - wheezing continued, improved, but still diffuse     - CTA PE without PE, but extensive fibrotic change with upper lobe predominance (c/w fibrosis 2/2 longstanding sarcoidosis change), bilateral pleural effusions (smaller than recent CT), ill defined air space opacity in the lower lobes.  Possible atypical pneumonia.   (see report)     - negative influenza, covid testing     - scheduled and prn nebs     - steroids      - doxy/ceftriaxone     - follow sputum cx     - continue duonebs, steroids     - check sputum (pending)/urine strep (negative)/urine legionella (pending)  Hypoglycemia Severe protein calorie malnutrition     - she reports poor po intake over the weekend     - glucose low at presentation but has recovered with fluids     - BG's improved today, continue to follow off dextrose containing fluids  Hyponatremia     - improved, continue to monitor  Hypophosphatemia  Hypomagnesemia     - replace and follow  Normocytic anemia     - no evidence of bleed, follow  Generalized weakness     - exam non-focal     - PT/OT consult  Anxiety     - continue zoloft  Hypertension      - BP's elevated, add amlodipien,  continue metop  Concern for afib by admitting provider.  I don't see recorded Deija Buhrman fib.  EKG with sinus tach and telemetry today with sinus rhythm as well.  Checked with central tele as well who were in agreement.  Consider following outpatient with monitor if concerns.  DVT prophylaxis: lovenox Code Status:full  Family Communication: none at bedside - no answer over phone 2/10 allie Disposition:   Status is: Inpatient  Remains inpatient appropriate because:Inpatient level of care appropriate due to severity of illness   Dispo: The patient is from: Home              Anticipated d/c is to: pending              Anticipated d/c date is: > 3 days              Patient currently is not medically stable to d/c.   Difficult to place patient No   Consultants:   none  Procedures:  none  Antimicrobials: Anti-infectives (From admission, onward)   Start     Dose/Rate Route Frequency Ordered Stop   09/19/20 1800  ceFEPIme (MAXIPIME) 2 g in sodium chloride 0.9 % 100 mL IVPB  Status:  Discontinued        2 g 200 mL/hr over 30  Minutes Intravenous Every 12 hours 09/19/20 1033 09/19/20 1407   09/19/20 1500  cefTRIAXone (ROCEPHIN) 1 g in sodium chloride 0.9 % 100 mL IVPB        1 g 200 mL/hr over 30 Minutes Intravenous Every 24 hours 09/19/20 1407     09/19/20 0300  doxycycline (VIBRAMYCIN) 100 mg in sodium chloride 0.9 % 250 mL IVPB        100 mg 125 mL/hr over 120 Minutes Intravenous Every 12 hours 09/18/20 1241     09/18/20 2200  ceFEPIme (MAXIPIME) 2 g in sodium chloride 0.9 % 100 mL IVPB  Status:  Discontinued        2 g 200 mL/hr over 30 Minutes Intravenous Every 8 hours 09/18/20 1241 09/19/20 1033   09/18/20 1245  ceFEPIme (MAXIPIME) 2 g in sodium chloride 0.9 % 100 mL IVPB        2 g 200 mL/hr over 30 Minutes Intravenous  Once 09/18/20 1239 09/18/20 1802   09/18/20 1245  doxycycline (VIBRAMYCIN) 100 mg in sodium chloride 0.9 % 250 mL IVPB        100 mg 125 mL/hr over 120 Minutes  Intravenous  Once 09/18/20 1239 09/18/20 1824   09/18/20 1230  cefTRIAXone (ROCEPHIN) 1 g in sodium chloride 0.9 % 100 mL IVPB  Status:  Discontinued        1 g 200 mL/hr over 30 Minutes Intravenous Every 24 hours 09/18/20 1225 09/18/20 1226   09/18/20 1230  azithromycin (ZITHROMAX) 500 mg in sodium chloride 0.9 % 250 mL IVPB  Status:  Discontinued        500 mg 250 mL/hr over 60 Minutes Intravenous Every 24 hours 09/18/20 1225 09/18/20 1320     Subjective: Doesn't feel ready to go home, still SOB with exertion  She needs to be able to manage independently at home, her daughter won't be there all the  time  Objective: Vitals:   09/20/20 0416 09/20/20 0600 09/20/20 0835 09/20/20 1218  BP: (!) 165/95 (!) 160/99 (!) 150/90 (!) 162/87  Pulse: (!) 105 (!) 108  (!) 103  Resp:   17 15  Temp: 99.5 F (37.5 C) 98.9 F (37.2 C) 98.8 F (37.1 C) 98.9 F (37.2 C)  TempSrc: Oral Oral Oral Oral  SpO2: 100% 100% 100%   Weight:      Height:        Intake/Output Summary (Last 24 hours) at 09/20/2020 1546 Last data filed at 09/20/2020 0600 Gross per 24 hour  Intake 894.65 ml  Output 700 ml  Net 194.65 ml   Filed Weights   09/19/20 0247 09/19/20 0550  Weight: 50 kg 48.9 kg    Examination:  General: No acute distress. Cardiovascular: Heart sounds show Jodeci Rini regular rate, and rhythm Lungs: diffuse wheezing, but improved from yesterday Abdomen: Soft, nontender, nondistended Neurological: Alert and oriented 3. Moves all extremities 4. Cranial nerves II through XII grossly intact. Skin: Warm and dry. No rashes or lesions. Extremities: No clubbing or cyanosis. No edema.    Data Reviewed: I have personally reviewed following labs and imaging studies  CBC: Recent Labs  Lab 09/18/20 0927 09/19/20 0613 09/20/20 0418  WBC 8.1 5.9 5.0  NEUTROABS 6.6  --  4.4  HGB 9.6* 9.5* 9.1*  HCT 27.8* 27.9* 27.6*  MCV 81.3 82.5 85.2  PLT 256 215 295    Basic Metabolic Panel: Recent Labs  Lab  09/18/20 0927 09/19/20 0613 09/19/20 1337 09/20/20 0418  NA 129* 126* 129* 130*  K 4.3 3.9 4.8 3.7  CL 90* 91* 92* 91*  CO2 21* 25 28 28   GLUCOSE 50* 138* 128* 162*  BUN 18 10 10 12   CREATININE 0.67 0.64 0.72 0.68  CALCIUM 8.7* 8.3* 8.9 8.5*  MG  --   --   --  1.6*  PHOS  --   --   --  1.4*    GFR: Estimated Creatinine Clearance: 47.6 mL/min (by C-G formula based on SCr of 0.68 mg/dL).  Liver Function Tests: Recent Labs  Lab 09/18/20 0927 09/19/20 0613 09/20/20 0418  AST 104* 63* 49*  ALT 42 34 30  ALKPHOS 119 99 91  BILITOT 1.6* 1.0 0.5  PROT 6.7 6.5 6.2*  ALBUMIN 2.9* 3.0* 2.8*    CBG: Recent Labs  Lab 09/19/20 2010 09/20/20 0010 09/20/20 0413 09/20/20 0738 09/20/20 1126  GLUCAP 164* 197* 153* 119* 165*     Recent Results (from the past 240 hour(s))  Resp Panel by RT-PCR (Flu Madalyn Legner&B, Covid) Nasopharyngeal Swab     Status: None   Collection Time: 09/18/20  8:46 AM   Specimen: Nasopharyngeal Swab; Nasopharyngeal(NP) swabs in vial transport medium  Result Value Ref Range Status   SARS Coronavirus 2 by RT PCR NEGATIVE NEGATIVE Final    Comment: (NOTE) SARS-CoV-2 target nucleic acids are NOT DETECTED.  The SARS-CoV-2 RNA is generally detectable in upper respiratory specimens during the acute phase of infection. The lowest concentration of SARS-CoV-2 viral copies this assay can detect is 138 copies/mL. Deverick Pruss negative result does not preclude SARS-Cov-2 infection and should not be used as the sole basis for treatment or other patient management decisions. Destany Severns negative result may occur with  improper specimen collection/handling, submission of specimen other than nasopharyngeal swab, presence of viral mutation(s) within the areas targeted by this assay, and inadequate number of viral copies(<138 copies/mL). Decorian Schuenemann negative result must be combined with clinical observations, patient history, and epidemiological information. The expected result is Negative.  Fact Sheet  for Patients:  EntrepreneurPulse.com.au  Fact Sheet for Healthcare Providers:  IncredibleEmployment.be  This test is no t yet approved or cleared by the [place] FDA and  has been authorized for detection and/or diagnosis of SARS-CoV-2 by FDA under an Emergency Use Authorization (EUA). This EUA will remain  in effect (meaning this test can be used) for the duration of the COVID-19 declaration under Section 564(b)(1) of the Act, 21 U.S.C.section 360bbb-3(b)(1), unless the authorization is terminated  or revoked sooner.       Influenza Twinkle Sockwell by PCR NEGATIVE NEGATIVE Final   Influenza B by PCR NEGATIVE NEGATIVE Final    Comment: (NOTE) The Xpert Xpress SARS-CoV-2/FLU/RSV plus assay is intended as an aid in the diagnosis of influenza from Nasopharyngeal swab specimens and should not be used as Darrien Laakso sole basis for treatment. Nasal washings and aspirates are unacceptable for Xpert Xpress SARS-CoV-2/FLU/RSV testing.  Fact Sheet for Patients: EntrepreneurPulse.com.au  Fact Sheet for Healthcare Providers: IncredibleEmployment.be  This test is not yet approved or cleared by the [place] FDA and has been authorized for detection and/or diagnosis of SARS-CoV-2 by FDA under an Emergency Use Authorization (EUA). This EUA will remain in effect (meaning this test can be used) for the duration of the COVID-19 declaration under Section 564(b)(1) of the Act, 21 U.S.C. section 360bbb-3(b)(1), unless the authorization is terminated or revoked.  Performed at [facility], [place] [address] [place], [place] [address]   Blood Culture (routine x 2)     Status:  None (Preliminary result)   Collection Time: 09/18/20  8:50 AM   Specimen: BLOOD RIGHT HAND  Result Value Ref Range Status   Specimen Description   Final    BLOOD RIGHT HAND Performed at [facility] [address]  [place], [place] [address]    Special Requests   Final    BOTTLES DRAWN AEROBIC AND ANAEROBIC Blood Culture results may not be optimal due to an inadequate volume of blood received in culture bottles Performed at [facility] [address], [place], [place] [address]    Culture   Final    NO GROWTH 2 DAYS Performed at [facility] [address], [place], [place] [address]    Report Status PENDING  Incomplete  Blood Culture (routine x 2)     Status: None (Preliminary result)   Collection Time: 09/18/20  9:27 AM   Specimen: BLOOD  Result Value Ref Range Status   Specimen Description   Final    BLOOD BLOOD RIGHT FOREARM Performed at [facility] [address], [place], [place] [address]    Special Requests   Final    BOTTLES DRAWN AEROBIC AND ANAEROBIC Blood Culture results may not be optimal due to an inadequate volume of blood received in culture bottles Performed at [facility] [address] [place],  [address]    Culture   Final    NO GROWTH 2 DAYS Performed at [facility] [address] [place],  [address]    Report Status PENDING  Incomplete         Radiology Studies: No results found.      Scheduled Meds: . amLODipine  5 mg Oral Daily  . docusate sodium  100 mg Oral BID  . enoxaparin (LOVENOX) injection  40 mg Subcutaneous Daily  . feeding supplement  237 mL Oral TID BM  . gabapentin  100 mg Oral QHS  . ipratropium-albuterol  3 mL Nebulization Q6H  . loratadine  10 mg Oral Daily  . melatonin  10 mg Oral QHS  . metoprolol succinate  50 mg Oral Daily  . mirtazapine  15 mg Oral QHS  . multivitamin with minerals  1 tablet Oral Daily  . pantoprazole  40 mg Oral Daily  . phosphorus  500 mg Oral QID  . pravastatin  40 mg Oral QHS  . predniSONE  40 mg Oral Q breakfast   Continuous Infusions: . cefTRIAXone (ROCEPHIN)  IV 1 g (09/19/20 1601)  . doxycycline (VIBRAMYCIN) IV  100 mg (09/20/20 1517)     LOS: 2 days    Time spent: over 58 min    Fayrene Helper, MD [facility]   To contact the attending provider between 7A-7P or the covering provider during after hours 7P-7A, please log into the web site [email or web address] and access using universal [place] password for that web site. If you do not have the password, please call the hospital operator.  09/20/2020, 3:46 PM

## 2020-09-21 ENCOUNTER — Inpatient Hospital Stay (HOSPITAL_COMMUNITY): Payer: Medicare Other

## 2020-09-21 DIAGNOSIS — J441 Chronic obstructive pulmonary disease with (acute) exacerbation: Secondary | ICD-10-CM | POA: Diagnosis not present

## 2020-09-21 LAB — CBC WITH DIFFERENTIAL/PLATELET
Abs Immature Granulocytes: 0.06 10*3/uL (ref 0.00–0.07)
Basophils Absolute: 0 10*3/uL (ref 0.0–0.1)
Basophils Relative: 0 %
Eosinophils Absolute: 0 10*3/uL (ref 0.0–0.5)
Eosinophils Relative: 0 %
HCT: 27.4 % — ABNORMAL LOW (ref 36.0–46.0)
Hemoglobin: 9 g/dL — ABNORMAL LOW (ref 12.0–15.0)
Immature Granulocytes: 1 %
Lymphocytes Relative: 16 %
Lymphs Abs: 1.1 10*3/uL (ref 0.7–4.0)
MCH: 27.8 pg (ref 26.0–34.0)
MCHC: 32.8 g/dL (ref 30.0–36.0)
MCV: 84.6 fL (ref 80.0–100.0)
Monocytes Absolute: 0.9 10*3/uL (ref 0.1–1.0)
Monocytes Relative: 13 %
Neutro Abs: 4.9 10*3/uL (ref 1.7–7.7)
Neutrophils Relative %: 70 %
Platelets: 213 10*3/uL (ref 150–400)
RBC: 3.24 MIL/uL — ABNORMAL LOW (ref 3.87–5.11)
RDW: 16.2 % — ABNORMAL HIGH (ref 11.5–15.5)
WBC: 7 10*3/uL (ref 4.0–10.5)
nRBC: 0 % (ref 0.0–0.2)

## 2020-09-21 LAB — COMPREHENSIVE METABOLIC PANEL
ALT: 28 U/L (ref 0–44)
AST: 44 U/L — ABNORMAL HIGH (ref 15–41)
Albumin: 2.8 g/dL — ABNORMAL LOW (ref 3.5–5.0)
Alkaline Phosphatase: 96 U/L (ref 38–126)
Anion gap: 8 (ref 5–15)
BUN: 14 mg/dL (ref 8–23)
CO2: 36 mmol/L — ABNORMAL HIGH (ref 22–32)
Calcium: 8.4 mg/dL — ABNORMAL LOW (ref 8.9–10.3)
Chloride: 93 mmol/L — ABNORMAL LOW (ref 98–111)
Creatinine, Ser: 0.68 mg/dL (ref 0.44–1.00)
GFR, Estimated: >60 mL/min (ref >60)
Glucose, Bld: 90 mg/dL (ref 70–99)
Potassium: 3.1 mmol/L — ABNORMAL LOW (ref 3.5–5.1)
Sodium: 137 mmol/L (ref 135–145)
Total Bilirubin: 0.4 mg/dL (ref 0.3–1.2)
Total Protein: 6.3 g/dL — ABNORMAL LOW (ref 6.5–8.1)

## 2020-09-21 LAB — PHOSPHORUS: Phosphorus: 1.8 mg/dL — ABNORMAL LOW (ref 2.5–4.6)

## 2020-09-21 LAB — GLUCOSE, CAPILLARY
Glucose-Capillary: 84 mg/dL (ref 70–99)
Glucose-Capillary: 111 mg/dL — ABNORMAL HIGH (ref 70–99)
Glucose-Capillary: 106 mg/dL — ABNORMAL HIGH (ref 70–99)
Glucose-Capillary: 103 mg/dL — ABNORMAL HIGH (ref 70–99)
Glucose-Capillary: 103 mg/dL — ABNORMAL HIGH (ref 70–99)

## 2020-09-21 LAB — MAGNESIUM: Magnesium: 1.4 mg/dL — ABNORMAL LOW (ref 1.7–2.4)

## 2020-09-21 MED ORDER — MAGNESIUM SULFATE 2 GM/50ML IV SOLN
2.0000 g | Freq: Once | INTRAVENOUS | Status: AC
  Administered 2020-09-21: 2 g via INTRAVENOUS
  Filled 2020-09-21: qty 50

## 2020-09-21 MED ORDER — POTASSIUM CHLORIDE CRYS ER 20 MEQ PO TBCR
40.0000 meq | EXTENDED_RELEASE_TABLET | ORAL | Status: AC
  Administered 2020-09-21: 40 meq via ORAL
  Filled 2020-09-21: qty 2

## 2020-09-21 MED ORDER — K PHOS MONO-SOD PHOS DI & MONO 155-852-130 MG PO TABS
500.0000 mg | ORAL_TABLET | Freq: Four times a day (QID) | ORAL | Status: AC
  Administered 2020-09-21 (×4): 500 mg via ORAL
  Filled 2020-09-21 (×4): qty 2

## 2020-09-21 NOTE — Care Management Important Message (Signed)
Medicare IM printed for [name] to give to the patient.  

## 2020-09-21 NOTE — Progress Notes (Addendum)
PROGRESS NOTE    [name]  HDQ:[id number] DOB: [birth year] DOA: 09/18/2020 PCP: Isaac Bliss, Rayford Halsted, MD   Chief Complaint  Patient presents with  . Hypoglycemia  . Cough   Brief Narrative:  [name] is [name] 75 y.o. female with medical history significant of COPD on chronic home O2 (2L [place]), sarcoidosis, HTN who presented with weakness and SOB.  On day of admission was lethargic and found to be hypoglycemic.  She had imaging findings concerning for atypical pneumonia.  She's been admitted for COPD exacerbation with possible atypical pneumonia.    Assessment & Plan:   Active Problems:   COPD exacerbation (HCC)  Atypical PNA Pulmonary fibrosis Sarcoidosis COPD exacerbation w/ chronic O2 supplementation (2L [place])     - on 3 L      - wheezing continued, tachypneic with increased WOB today     - CTA PE without PE, but extensive fibrotic change with upper lobe predominance (c/w fibrosis 2/2 longstanding sarcoidosis change), bilateral pleural effusions (smaller than recent CT), ill defined air space opacity in the lower lobes.  Possible atypical pneumonia.   (see report)     - negative influenza, covid testing     - scheduled and prn nebs     - steroids      - doxy/ceftriaxone     - follow sputum cx     - continue duonebs, steroids     - check sputum (pending)/urine strep (negative)/urine legionella (pending)  Hypoglycemia Severe protein calorie malnutrition     - she reports poor po intake over the weekend     - glucose low at presentation but has recovered with fluids     - BG's improved today, continue to follow off dextrose containing fluids  Hyponatremia     - improved, continue to monitor  Hypokalemia  Hypophosphatemia  Hypomagnesemia     - replace and follow  Normocytic anemia     - no evidence of bleed, follow  Generalized weakness     - exam non-focal     - PT/OT consult  Anxiety     - continue zoloft  Hypertension      - BP's  elevated, add amlodipien, continue metop  Concern for afib by admitting provider.  I don't see recorded Neils Siracusa fib.  EKG with sinus tach and telemetry today with sinus rhythm as well.  Checked with central tele as well who were in agreement.  Consider following outpatient with monitor if concerns.  DVT prophylaxis: lovenox Code Status:full  Family Communication: none at bedside -  phone 2/11 allie Disposition:   Status is: Inpatient  Remains inpatient appropriate because:Inpatient level of care appropriate due to severity of illness   Dispo: The patient is from: Home              Anticipated d/c is to: pending              Anticipated d/c date is: > 3 days              Patient currently is not medically stable to d/c.   Difficult to place patient No   Consultants:   none  Procedures:  none  Antimicrobials: Anti-infectives (From admission, onward)   Start     Dose/Rate Route Frequency Ordered Stop   09/19/20 1800  ceFEPIme (MAXIPIME) 2 g in sodium chloride 0.9 % 100 mL IVPB  Status:  Discontinued        2 g 200 mL/hr over  30 Minutes Intravenous Every 12 hours 09/19/20 1033 09/19/20 1407   09/19/20 1500  cefTRIAXone (ROCEPHIN) 1 g in sodium chloride 0.9 % 100 mL IVPB        1 g 200 mL/hr over 30 Minutes Intravenous Every 24 hours 09/19/20 1407     09/19/20 0300  doxycycline (VIBRAMYCIN) 100 mg in sodium chloride 0.9 % 250 mL IVPB        100 mg 125 mL/hr over 120 Minutes Intravenous Every 12 hours 09/18/20 1241     09/18/20 2200  ceFEPIme (MAXIPIME) 2 g in sodium chloride 0.9 % 100 mL IVPB  Status:  Discontinued        2 g 200 mL/hr over 30 Minutes Intravenous Every 8 hours 09/18/20 1241 09/19/20 1033   09/18/20 1245  ceFEPIme (MAXIPIME) 2 g in sodium chloride 0.9 % 100 mL IVPB        2 g 200 mL/hr over 30 Minutes Intravenous  Once 09/18/20 1239 09/18/20 1802   09/18/20 1245  doxycycline (VIBRAMYCIN) 100 mg in sodium chloride 0.9 % 250 mL IVPB        100 mg 125 mL/hr over 120  Minutes Intravenous  Once 09/18/20 1239 09/18/20 1824   09/18/20 1230  cefTRIAXone (ROCEPHIN) 1 g in sodium chloride 0.9 % 100 mL IVPB  Status:  Discontinued        1 g 200 mL/hr over 30 Minutes Intravenous Every 24 hours 09/18/20 1225 09/18/20 1226   09/18/20 1230  azithromycin (ZITHROMAX) 500 mg in sodium chloride 0.9 % 250 mL IVPB  Status:  Discontinued        500 mg 250 mL/hr over 60 Minutes Intravenous Every 24 hours 09/18/20 1225 09/18/20 1320     Subjective: Continued SOB  Objective: Vitals:   09/20/20 2033 09/21/20 0446 09/21/20 1257 09/21/20 1353  BP: 131/73 (!) 149/92 (!) 141/98   Pulse: 100 93 93   Resp: 20  14   Temp: 98.3 F (36.8 C) 98.4 F (36.9 C) 98.4 F (36.9 C)   TempSrc: Oral Oral Oral   SpO2: 100% 100% 100% 99%  Weight:      Height:        Intake/Output Summary (Last 24 hours) at 09/21/2020 1412 Last data filed at 09/21/2020 0842 Gross per 24 hour  Intake 1182.97 ml  Output 1552 ml  Net -369.03 ml   Filed Weights   09/19/20 0247 09/19/20 0550  Weight: 50 kg 48.9 kg    Examination:  General: No acute distress. Cardiovascular: Heart sounds show [name] regular rate, and rhythm Lungs: speaking in 3 word sentences, diffuse wheezing Abdomen: Soft, nontender, nondistended  Neurological: Alert and oriented 3. Moves all extremities 4. Cranial nerves II through XII grossly intact. Skin: Warm and dry. No rashes or lesions. Extremities: No clubbing or cyanosis. No edema.    Data Reviewed: I have personally reviewed following labs and imaging studies  CBC: Recent Labs  Lab 09/18/20 0927 09/19/20 0613 09/20/20 0418 09/21/20 0414  WBC 8.1 5.9 5.0 7.0  NEUTROABS 6.6  --  4.4 4.9  HGB 9.6* 9.5* 9.1* 9.0*  HCT 27.8* 27.9* 27.6* 27.4*  MCV 81.3 82.5 85.2 84.6  PLT 256 215 196 794    Basic Metabolic Panel: Recent Labs  Lab 09/18/20 0927 09/19/20 0613 09/19/20 1337 09/20/20 0418 09/21/20 0414  NA 129* 126* 129* 130* 137  K 4.3 3.9 4.8 3.7 3.1*   CL 90* 91* 92* 91* 93*  CO2 21* 25 28 28  36*  GLUCOSE 50* 138* 128* 162* 90  BUN 18 10 10 12 14   CREATININE 0.67 0.64 0.72 0.68 0.68  CALCIUM 8.7* 8.3* 8.9 8.5* 8.4*  MG  --   --   --  1.6* 1.4*  PHOS  --   --   --  1.4* 1.8*    GFR: Estimated Creatinine Clearance: 47.6 mL/min (by C-G formula based on SCr of 0.68 mg/dL).  Liver Function Tests: Recent Labs  Lab 09/18/20 0927 09/19/20 0613 09/20/20 0418 09/21/20 0414  AST 104* 63* 49* 44*  ALT 42 34 30 28  ALKPHOS 119 99 91 96  BILITOT 1.6* 1.0 0.5 0.4  PROT 6.7 6.5 6.2* 6.3*  ALBUMIN 2.9* 3.0* 2.8* 2.8*    CBG: Recent Labs  Lab 09/20/20 1951 09/20/20 2342 09/21/20 0440 09/21/20 0733 09/21/20 1151  GLUCAP 134* 95 84 111* 106*     Recent Results (from the past 240 hour(s))  Resp Panel by RT-PCR (Flu Linsie Lupo&B, Covid) Nasopharyngeal Swab     Status: None   Collection Time: 09/18/20  8:46 AM   Specimen: Nasopharyngeal Swab; Nasopharyngeal(NP) swabs in vial transport medium  Result Value Ref Range Status   SARS Coronavirus 2 by RT PCR NEGATIVE NEGATIVE Final    Comment: (NOTE) SARS-CoV-2 target nucleic acids are NOT DETECTED.  The SARS-CoV-2 RNA is generally detectable in upper respiratory specimens during the acute phase of infection. The lowest concentration of SARS-CoV-2 viral copies this assay can detect is 138 copies/mL. Windel Keziah negative result does not preclude SARS-Cov-2 infection and should not be used as the sole basis for treatment or other patient management decisions. Nyheem Binette negative result may occur with  improper specimen collection/handling, submission of specimen other than nasopharyngeal swab, presence of viral mutation(s) within the areas targeted by this assay, and inadequate number of viral copies(<138 copies/mL). Makayla Lanter negative result must be combined with clinical observations, patient history, and epidemiological information. The expected result is Negative.  Fact Sheet for Patients:   EntrepreneurPulse.com.au  Fact Sheet for Healthcare Providers:  IncredibleEmployment.be  This test is no t yet approved or cleared by the [place] FDA and  has been authorized for detection and/or diagnosis of SARS-CoV-2 by FDA under an Emergency Use Authorization (EUA). This EUA will remain  in effect (meaning this test can be used) for the duration of the COVID-19 declaration under Section 564(b)(1) of the Act, 21 U.S.C.section 360bbb-3(b)(1), unless the authorization is terminated  or revoked sooner.       Influenza Rosene Pilling by PCR NEGATIVE NEGATIVE Final   Influenza B by PCR NEGATIVE NEGATIVE Final    Comment: (NOTE) The Xpert Xpress SARS-CoV-2/FLU/RSV plus assay is intended as an aid in the diagnosis of influenza from Nasopharyngeal swab specimens and should not be used as Anja Neuzil sole basis for treatment. Nasal washings and aspirates are unacceptable for Xpert Xpress SARS-CoV-2/FLU/RSV testing.  Fact Sheet for Patients: EntrepreneurPulse.com.au  Fact Sheet for Healthcare Providers: IncredibleEmployment.be  This test is not yet approved or cleared by the [place] FDA and has been authorized for detection and/or diagnosis of SARS-CoV-2 by FDA under an Emergency Use Authorization (EUA). This EUA will remain in effect (meaning this test can be used) for the duration of the COVID-19 declaration under Section 564(b)(1) of the Act, 21 U.S.C. section 360bbb-3(b)(1), unless the authorization is terminated or revoked.  Performed at [facility], [place] [address] [place], [place] [address]   Blood Culture (routine x 2)     Status: None (Preliminary result)  Collection Time: 09/18/20  8:50 AM   Specimen: BLOOD RIGHT HAND  Result Value Ref Range Status   Specimen Description   Final    BLOOD RIGHT HAND Performed at [facility] [address] [place], [place]  [address]    Special Requests   Final    BOTTLES DRAWN AEROBIC AND ANAEROBIC Blood Culture results may not be optimal due to an inadequate volume of blood received in culture bottles Performed at [facility] [address], [place], [place] [address]    Culture   Final    NO GROWTH 3 DAYS Performed at [facility], [place] [address] [place], [place] [address]    Report Status PENDING  Incomplete  Blood Culture (routine x 2)     Status: None (Preliminary result)   Collection Time: 09/18/20  9:27 AM   Specimen: BLOOD  Result Value Ref Range Status   Specimen Description   Final    BLOOD BLOOD RIGHT FOREARM Performed at [facility] [address], [place], [place] [address]    Special Requests   Final    BOTTLES DRAWN AEROBIC AND ANAEROBIC Blood Culture results may not be optimal due to an inadequate volume of blood received in culture bottles Performed at [facility] [address], [place], [place] [address]    Culture   Final    NO GROWTH 3 DAYS Performed at [facility], [place] [address], [place], [place] [address]    Report Status PENDING  Incomplete         Radiology Studies: No results found.      Scheduled Meds: . amLODipine  5 mg Oral Daily  . docusate sodium  100 mg Oral BID  . enoxaparin (LOVENOX) injection  40 mg Subcutaneous Daily  . feeding supplement  237 mL Oral TID BM  . gabapentin  100 mg Oral QHS  . ipratropium-albuterol  3 mL Nebulization TID  . loratadine  10 mg Oral Daily  . melatonin  10 mg Oral QHS  . metoprolol succinate  50 mg Oral Daily  . mirtazapine  15 mg Oral QHS  . multivitamin with minerals  1 tablet Oral Daily  . pantoprazole  40 mg Oral Daily  . phosphorus  500 mg Oral QID  . potassium chloride  40 mEq Oral Q4H  . pravastatin  40 mg Oral QHS  . predniSONE  40 mg Oral Q breakfast   Continuous Infusions: . cefTRIAXone (ROCEPHIN)  IV 1 g (09/20/20 1743)  .  doxycycline (VIBRAMYCIN) IV 100 mg (09/21/20 1400)     LOS: 3 days    Time spent: over 30 min    Fayrene Helper, MD [facility]   To contact the attending provider between 7A-7P or the covering provider during after hours 7P-7A, please log into the web site [email or web address] and access using universal [place] password for that web site. If you do not have the password, please call the hospital operator.  09/21/2020, 2:12 PM

## 2020-09-21 NOTE — Progress Notes (Signed)
Pt alert and aware sitting up in bed. Pt talked about growing up back in the day and what kids are doing now. She talked about her faith and singing in the choir. The chaplain offered caring and supportive presence and listening ear, prayers and blessings.

## 2020-09-21 NOTE — Plan of Care (Signed)
  Problem: Health Behavior/Discharge Planning: Goal: Ability to manage health-related needs will improve Outcome: Progressing   Problem: Clinical Measurements: Goal: Respiratory complications will improve Outcome: Progressing   Problem: Activity: Goal: Risk for activity intolerance will decrease Outcome: Progressing   Problem: Nutrition: Goal: Adequate nutrition will be maintained Outcome: Progressing   Problem: Pain Managment: Goal: General experience of comfort will improve Outcome: Progressing   

## 2020-09-21 NOTE — Progress Notes (Signed)
Physical Therapy Treatment Patient Details Name: [name] MRN: [id number] DOB: 09/30/1945 Today's Date: 09/21/2020    History of Present Illness 75 yo female admitted with COPD exac. Hx of chronic respiratory failure-O2 dep, sarcoidosis, L eye blindness, recurrent R hip dislocation,, COPD, falls, pulmonary fibrosis    PT Comments    Pt in bed on 3 lts nasal at 94% and HR 89.  General Comments: AxO x 3 pleasant and very social Assisted OOB. General bed mobility comments: self able with increased time General transfer comment: self able to rise with increased time also assisted with a toilet transfer.  Pt able to self peri care and maintain a safe balance.  Good use of hands to steady self. General Gait Details: remained on 3 lts O2 sats avg 89% with HR 114 during activity.  No c/o dyspnea.  Infact, pt talked the entire time.  No true dyspnea. Slow.  Forward flex posture.  Very thin fragile frame. Assisted back to bed and positioned to comfort.     Follow Up Recommendations  Home health PT;Supervision for mobility/OOB     Equipment Recommendations  Rolling walker with 5" wheels    Recommendations for Other Services       Precautions / Restrictions Precautions Precautions: Fall Precaution Comments: O2 dep @ baseline 2 Lts    Mobility  Bed Mobility Overal bed mobility: Modified Independent             General bed mobility comments: self able with increased time    Transfers Overall transfer level: Needs assistance Equipment used: None Transfers: Sit to/from [employer] Transfers Sit to Stand: Supervision Stand pivot transfers: Supervision;Min guard       General transfer comment: self able to rise with increased time also assisted with a toilet transfer.  Pt able to self peri care and maintain a safe balance.  Good use of hands to steady self.  Ambulation/Gait Ambulation/Gait assistance: Supervision;Min guard Gait Distance (Feet): 12 Feet (6 feet x  2 to and from bathroom) Assistive device: Rolling walker (2 wheeled) Gait Pattern/deviations: Step-through pattern;Decreased stride length Gait velocity: decreased   General Gait Details: remained on 3 lts O2 sats avg 89% with HR 114 during activity.  No c/o dyspnea.  Infact, pt talked the entire time.  No true dyspnea. Slow.  Forward flex posture.  Very thin fragile frame.   Stairs             Wheelchair Mobility    Modified Rankin (Stroke Patients Only)       Balance                                            Cognition Arousal/Alertness: Awake/alert Behavior During Therapy: WFL for tasks assessed/performed Overall Cognitive Status: Within Functional Limits for tasks assessed                                 General Comments: AxO x 3 pleasant and very social      Exercises      General Comments        Pertinent Vitals/Pain Pain Assessment: Faces Faces Pain Scale: Hurts a little bit Pain Location: L flank Pain Descriptors / Indicators: Sore;Discomfort Pain Intervention(s): Monitored during session    Home Living  Prior Function            PT Goals (current goals can now be found in the care plan section) Progress towards PT goals: Progressing toward goals    Frequency    Min 3X/week      PT Plan Current plan remains appropriate    Co-evaluation              AM-PAC PT "6 Clicks" Mobility   Outcome Measure  Help needed turning from your back to your side while in a flat bed without using bedrails?: None Help needed moving from lying on your back to sitting on the side of a flat bed without using bedrails?: None Help needed moving to and from a bed to a chair (including a wheelchair)?: None Help needed standing up from a chair using your arms (e.g., wheelchair or bedside chair)?: A Little Help needed to walk in hospital room?: A Little Help needed climbing 3-5 steps with a  railing? : A Little 6 Click Score: 21    End of Session Equipment Utilized During Treatment: Oxygen Activity Tolerance: Patient limited by fatigue Patient left: in bed;with call bell/phone within reach;with bed alarm set   PT Visit Diagnosis: Muscle weakness (generalized) (M62.81);Difficulty in walking, not elsewhere classified (R26.2)     Time: 7092-9574 PT Time Calculation (min) (ACUTE ONLY): 26 min  Charges:  $Gait Training: 8-22 mins $Therapeutic Activity: 8-22 mins                     Rica Koyanagi  PTA Acute  Rehabilitation Services Pager      [phone] Office      [phone]

## 2020-09-22 DIAGNOSIS — E162 Hypoglycemia, unspecified: Secondary | ICD-10-CM | POA: Diagnosis not present

## 2020-09-22 DIAGNOSIS — J441 Chronic obstructive pulmonary disease with (acute) exacerbation: Secondary | ICD-10-CM | POA: Diagnosis not present

## 2020-09-22 LAB — COMPREHENSIVE METABOLIC PANEL
ALT: 26 U/L (ref 0–44)
AST: 30 U/L (ref 15–41)
Albumin: 2.6 g/dL — ABNORMAL LOW (ref 3.5–5.0)
Alkaline Phosphatase: 81 U/L (ref 38–126)
Anion gap: 10 (ref 5–15)
BUN: 14 mg/dL (ref 8–23)
CO2: 35 mmol/L — ABNORMAL HIGH (ref 22–32)
Calcium: 8.1 mg/dL — ABNORMAL LOW (ref 8.9–10.3)
Chloride: 91 mmol/L — ABNORMAL LOW (ref 98–111)
Creatinine, Ser: 0.65 mg/dL (ref 0.44–1.00)
GFR, Estimated: >60 mL/min (ref >60)
Glucose, Bld: 75 mg/dL (ref 70–99)
Potassium: 3.4 mmol/L — ABNORMAL LOW (ref 3.5–5.1)
Sodium: 136 mmol/L (ref 135–145)
Total Bilirubin: 0.5 mg/dL (ref 0.3–1.2)
Total Protein: 5.5 g/dL — ABNORMAL LOW (ref 6.5–8.1)

## 2020-09-22 LAB — GLUCOSE, CAPILLARY
Glucose-Capillary: 123 mg/dL — ABNORMAL HIGH (ref 70–99)
Glucose-Capillary: 131 mg/dL — ABNORMAL HIGH (ref 70–99)
Glucose-Capillary: 156 mg/dL — ABNORMAL HIGH (ref 70–99)
Glucose-Capillary: 182 mg/dL — ABNORMAL HIGH (ref 70–99)
Glucose-Capillary: 81 mg/dL (ref 70–99)
Glucose-Capillary: 93 mg/dL (ref 70–99)

## 2020-09-22 LAB — MAGNESIUM: Magnesium: 1.7 mg/dL (ref 1.7–2.4)

## 2020-09-22 LAB — PHOSPHORUS: Phosphorus: 3.9 mg/dL (ref 2.5–4.6)

## 2020-09-22 LAB — CBC WITH DIFFERENTIAL/PLATELET
Abs Immature Granulocytes: 0.04 10*3/uL (ref 0.00–0.07)
Basophils Absolute: 0 10*3/uL (ref 0.0–0.1)
Basophils Relative: 0 %
Eosinophils Absolute: 0.1 10*3/uL (ref 0.0–0.5)
Eosinophils Relative: 1 %
HCT: 26.3 % — ABNORMAL LOW (ref 36.0–46.0)
Hemoglobin: 8.6 g/dL — ABNORMAL LOW (ref 12.0–15.0)
Immature Granulocytes: 1 %
Lymphocytes Relative: 22 %
Lymphs Abs: 1.4 10*3/uL (ref 0.7–4.0)
MCH: 27.7 pg (ref 26.0–34.0)
MCHC: 32.7 g/dL (ref 30.0–36.0)
MCV: 84.8 fL (ref 80.0–100.0)
Monocytes Absolute: 0.8 10*3/uL (ref 0.1–1.0)
Monocytes Relative: 12 %
Neutro Abs: 4.3 10*3/uL (ref 1.7–7.7)
Neutrophils Relative %: 64 %
Platelets: 212 10*3/uL (ref 150–400)
RBC: 3.1 MIL/uL — ABNORMAL LOW (ref 3.87–5.11)
RDW: 16.1 % — ABNORMAL HIGH (ref 11.5–15.5)
WBC: 6.6 10*3/uL (ref 4.0–10.5)
nRBC: 0 % (ref 0.0–0.2)

## 2020-09-22 MED ORDER — POTASSIUM CHLORIDE CRYS ER 20 MEQ PO TBCR
40.0000 meq | EXTENDED_RELEASE_TABLET | Freq: Once | ORAL | Status: AC
  Administered 2020-09-22: 40 meq via ORAL
  Filled 2020-09-22: qty 2

## 2020-09-22 MED ORDER — IPRATROPIUM-ALBUTEROL 0.5-2.5 (3) MG/3ML IN SOLN
3.0000 mL | Freq: Two times a day (BID) | RESPIRATORY_TRACT | Status: DC
  Administered 2020-09-23: 3 mL via RESPIRATORY_TRACT
  Filled 2020-09-22: qty 3

## 2020-09-22 NOTE — Plan of Care (Signed)

## 2020-09-22 NOTE — Plan of Care (Signed)

## 2020-09-22 NOTE — Progress Notes (Signed)
PROGRESS NOTE    [name]  UMP:[id number] DOB: [birth year] DOA: 09/18/2020 PCP: Isaac Bliss, Rayford Halsted, MD   Chief Complaint  Patient presents with  . Hypoglycemia  . Cough   Brief Narrative:  [name] is [name] 75 y.o. female with medical history significant of COPD on chronic home O2 (2L [place]), sarcoidosis, HTN who presented with weakness and SOB.  On day of admission was lethargic and found to be hypoglycemic.  She had imaging findings concerning for atypical pneumonia.  She's been admitted for COPD exacerbation with possible atypical pneumonia.    Assessment & Plan:   Active Problems:   COPD exacerbation (HCC)  Atypical PNA Pulmonary fibrosis Sarcoidosis COPD exacerbation w/ chronic O2 supplementation (2L [place])     - on 3 L [place]     - wheezing improved today, hopefully ready for d/c 2/13 am     - CTA PE without PE, but extensive fibrotic change with upper lobe predominance (c/w fibrosis 2/2 longstanding sarcoidosis change), bilateral pleural effusions (smaller than recent CT), ill defined air space opacity in the lower lobes.  Possible atypical pneumonia.   (see report)     - negative influenza, covid testing     - scheduled and prn nebs     - steroids      - doxy/ceftriaxone     - follow sputum cx     - continue duonebs, steroids     - check sputum (pending)/urine strep (negative)/urine legionella (pending)  Hypoglycemia Severe protein calorie malnutrition     - she reports poor po intake over the weekend     - glucose low at presentation but has recovered with fluids     - BG's improved today, continue to follow off dextrose containing fluids  Hyponatremia     - improved, continue to monitor  Hypokalemia  Hypophosphatemia  Hypomagnesemia     - replace and follow  Normocytic anemia     - no evidence of bleed, follow  Generalized weakness     - exam non-focal     - PT/OT consult  Anxiety     - continue zoloft  Hypertension      - BP's  elevated, add amlodipien, continue metop  Concern for afib by admitting provider.  I don't see recorded Robert Sperl fib.  EKG with sinus tach and telemetry today with sinus rhythm as well.  Checked with central tele as well who were in agreement.  Consider following outpatient with monitor if concerns.   DVT prophylaxis: lovenox Code Status:full  Family Communication: none at bedside -  phone 2/11 allie Disposition:   Status is: Inpatient  Remains inpatient appropriate because:Inpatient level of care appropriate due to severity of illness   Dispo: The patient is from: Home              Anticipated d/c is to: pending              Anticipated d/c date is: > 3 days              Patient currently is not medically stable to d/c.   Difficult to place patient No   Consultants:   none  Procedures:  none  Antimicrobials: Anti-infectives (From admission, onward)   Start     Dose/Rate Route Frequency Ordered Stop   09/19/20 1800  ceFEPIme (MAXIPIME) 2 g in sodium chloride 0.9 % 100 mL IVPB  Status:  Discontinued        2 g  200 mL/hr over 30 Minutes Intravenous Every 12 hours 09/19/20 1033 09/19/20 1407   09/19/20 1500  cefTRIAXone (ROCEPHIN) 1 g in sodium chloride 0.9 % 100 mL IVPB        1 g 200 mL/hr over 30 Minutes Intravenous Every 24 hours 09/19/20 1407     09/19/20 0300  doxycycline (VIBRAMYCIN) 100 mg in sodium chloride 0.9 % 250 mL IVPB        100 mg 125 mL/hr over 120 Minutes Intravenous Every 12 hours 09/18/20 1241     09/18/20 2200  ceFEPIme (MAXIPIME) 2 g in sodium chloride 0.9 % 100 mL IVPB  Status:  Discontinued        2 g 200 mL/hr over 30 Minutes Intravenous Every 8 hours 09/18/20 1241 09/19/20 1033   09/18/20 1245  ceFEPIme (MAXIPIME) 2 g in sodium chloride 0.9 % 100 mL IVPB        2 g 200 mL/hr over 30 Minutes Intravenous  Once 09/18/20 1239 09/18/20 1802   09/18/20 1245  doxycycline (VIBRAMYCIN) 100 mg in sodium chloride 0.9 % 250 mL IVPB        100 mg 125 mL/hr over 120  Minutes Intravenous  Once 09/18/20 1239 09/18/20 1824   09/18/20 1230  cefTRIAXone (ROCEPHIN) 1 g in sodium chloride 0.9 % 100 mL IVPB  Status:  Discontinued        1 g 200 mL/hr over 30 Minutes Intravenous Every 24 hours 09/18/20 1225 09/18/20 1226   09/18/20 1230  azithromycin (ZITHROMAX) 500 mg in sodium chloride 0.9 % 250 mL IVPB  Status:  Discontinued        500 mg 250 mL/hr over 60 Minutes Intravenous Every 24 hours 09/18/20 1225 09/18/20 1320     Subjective: Notes feels better, doesn't feel ready for home yet  Objective: Vitals:   09/21/20 2109 09/22/20 0634 09/22/20 0836 09/22/20 1201  BP:  (!) 166/89  102/64  Pulse:  91  80  Resp:  16  18  Temp:  98.4 F (36.9 C)  99.3 F (37.4 C)  TempSrc:  Oral  Oral  SpO2: 94% 100% 100% 100%  Weight:      Height:        Intake/Output Summary (Last 24 hours) at 09/22/2020 1444 Last data filed at 09/22/2020 0600 Gross per 24 hour  Intake 840 ml  Output --  Net 840 ml   Filed Weights   09/19/20 0247 09/19/20 0550  Weight: 50 kg 48.9 kg    Examination: General: No acute distress. Cardiovascular: RRR Lungs: scattered faint wheezing, improving Abdomen: Soft, nontender, nondistended  Neurological: Alert and oriented 3. Moves all extremities 4 . Cranial nerves II through XII grossly intact. Skin: Warm and dry. No rashes or lesions. Extremities: No clubbing or cyanosis. No edema.  Data Reviewed: I have personally reviewed following labs and imaging studies  CBC: Recent Labs  Lab 09/18/20 0927 09/19/20 0613 09/20/20 0418 09/21/20 0414 09/22/20 0419  WBC 8.1 5.9 5.0 7.0 6.6  NEUTROABS 6.6  --  4.4 4.9 4.3  HGB 9.6* 9.5* 9.1* 9.0* 8.6*  HCT 27.8* 27.9* 27.6* 27.4* 26.3*  MCV 81.3 82.5 85.2 84.6 84.8  PLT 256 215 196 213 474    Basic Metabolic Panel: Recent Labs  Lab 09/19/20 0613 09/19/20 1337 09/20/20 0418 09/21/20 0414 09/22/20 0419  NA 126* 129* 130* 137 136  K 3.9 4.8 3.7 3.1* 3.4*  CL 91* 92* 91* 93*  91*  CO2 25 28 28  36* 35*  GLUCOSE 138* 128* 162* 90 75  BUN 10 10 12 14 14   CREATININE 0.64 0.72 0.68 0.68 0.65  CALCIUM 8.3* 8.9 8.5* 8.4* 8.1*  MG  --   --  1.6* 1.4* 1.7  PHOS  --   --  1.4* 1.8* 3.9    GFR: Estimated Creatinine Clearance: 47.6 mL/min (by C-G formula based on SCr of 0.65 mg/dL).  Liver Function Tests: Recent Labs  Lab 09/18/20 0927 09/19/20 0613 09/20/20 0418 09/21/20 0414 09/22/20 0419  AST 104* 63* 49* 44* 30  ALT 42 34 30 28 26   ALKPHOS 119 99 91 96 81  BILITOT 1.6* 1.0 0.5 0.4 0.5  PROT 6.7 6.5 6.2* 6.3* 5.5*  ALBUMIN 2.9* 3.0* 2.8* 2.8* 2.6*    CBG: Recent Labs  Lab 09/21/20 1718 09/21/20 2013 09/22/20 0021 09/22/20 0755 09/22/20 1157  GLUCAP 103* 103* 182* 81 156*     Recent Results (from the past 240 hour(s))  Resp Panel by RT-PCR (Flu Amai Cappiello&B, Covid) Nasopharyngeal Swab     Status: None   Collection Time: 09/18/20  8:46 AM   Specimen: Nasopharyngeal Swab; Nasopharyngeal(NP) swabs in vial transport medium  Result Value Ref Range Status   SARS Coronavirus 2 by RT PCR NEGATIVE NEGATIVE Final    Comment: (NOTE) SARS-CoV-2 target nucleic acids are NOT DETECTED.  The SARS-CoV-2 RNA is generally detectable in upper respiratory specimens during the acute phase of infection. The lowest concentration of SARS-CoV-2 viral copies this assay can detect is 138 copies/mL. Nou Chard negative result does not preclude SARS-Cov-2 infection and should not be used as the sole basis for treatment or other patient management decisions. Cammi Consalvo negative result may occur with  improper specimen collection/handling, submission of specimen other than nasopharyngeal swab, presence of viral mutation(s) within the areas targeted by this assay, and inadequate number of viral copies(<138 copies/mL). Deondrae Mcgrail negative result must be combined with clinical observations, patient history, and epidemiological information. The expected result is Negative.  Fact Sheet for Patients:   EntrepreneurPulse.com.au  Fact Sheet for Healthcare Providers:  IncredibleEmployment.be  This test is no t yet approved or cleared by the [place] FDA and  has been authorized for detection and/or diagnosis of SARS-CoV-2 by FDA under an Emergency Use Authorization (EUA). This EUA will remain  in effect (meaning this test can be used) for the duration of the COVID-19 declaration under Section 564(b)(1) of the Act, 21 U.S.C.section 360bbb-3(b)(1), unless the authorization is terminated  or revoked sooner.       Influenza Naveya Ellerman by PCR NEGATIVE NEGATIVE Final   Influenza B by PCR NEGATIVE NEGATIVE Final    Comment: (NOTE) The Xpert Xpress SARS-CoV-2/FLU/RSV plus assay is intended as an aid in the diagnosis of influenza from Nasopharyngeal swab specimens and should not be used as Janille Draughon sole basis for treatment. Nasal washings and aspirates are unacceptable for Xpert Xpress SARS-CoV-2/FLU/RSV testing.  Fact Sheet for Patients: EntrepreneurPulse.com.au  Fact Sheet for Healthcare Providers: IncredibleEmployment.be  This test is not yet approved or cleared by the [place] FDA and has been authorized for detection and/or diagnosis of SARS-CoV-2 by FDA under an Emergency Use Authorization (EUA). This EUA will remain in effect (meaning this test can be used) for the duration of the COVID-19 declaration under Section 564(b)(1) of the Act, 21 U.S.C. section 360bbb-3(b)(1), unless the authorization is terminated or revoked.  Performed at [facility], [place] [address], [place], [place] [address]   Blood Culture (routine x 2)     Status:  None (Preliminary result)   Collection Time: 09/18/20  8:50 AM   Specimen: BLOOD RIGHT HAND  Result Value Ref Range Status   Specimen Description   Final    BLOOD RIGHT HAND Performed at [facility] [address], [place], [place]  [address]    Special Requests   Final    BOTTLES DRAWN AEROBIC AND ANAEROBIC Blood Culture results may not be optimal due to an inadequate volume of blood received in culture bottles Performed at [facility] [address] [place], [place] [address]    Culture   Final    NO GROWTH 3 DAYS Performed at [facility], [place] [address], [place], [place] [address]    Report Status PENDING  Incomplete  Blood Culture (routine x 2)     Status: None (Preliminary result)   Collection Time: 09/18/20  9:27 AM   Specimen: BLOOD  Result Value Ref Range Status   Specimen Description   Final    BLOOD BLOOD RIGHT FOREARM Performed at [facility] [address], [place], [place] [address]    Special Requests   Final    BOTTLES DRAWN AEROBIC AND ANAEROBIC Blood Culture results may not be optimal due to an inadequate volume of blood received in culture bottles Performed at [facility] [address], [place], [place] [address]    Culture   Final    NO GROWTH 3 DAYS Performed at [facility], [place] [address], [place], [place] [address]    Report Status PENDING  Incomplete         Radiology Studies: DG Ribs Unilateral W/Chest Left  Result Date: 09/21/2020 CLINICAL DATA:  [place] from her bed 5 days ago. Left posterior chest wall pain. EXAM: LEFT RIBS AND CHEST - 3+ VIEW COMPARISON:  09/18/2020.  CT and chest radiograph. FINDINGS: No convincing fracture.  No bone lesion. Lungs demonstrate extensive chronic changes of pleural and parenchymal scarring and bronchiectasis, without significant change from the prior exams. No convincing pleural effusion or pneumothorax. Multiple calcified mediastinal and hilar lymph nodes. IMPRESSION: 1. No convincing rib fracture or rib lesion. 2. No acute cardiopulmonary disease. Electronically Signed   By: Lajean Manes M.D.   On: 09/21/2020 20:58        Scheduled Meds: . amLODipine  5 mg Oral Daily   . docusate sodium  100 mg Oral BID  . enoxaparin (LOVENOX) injection  40 mg Subcutaneous Daily  . feeding supplement  237 mL Oral TID BM  . gabapentin  100 mg Oral QHS  . ipratropium-albuterol  3 mL Nebulization TID  . loratadine  10 mg Oral Daily  . melatonin  10 mg Oral QHS  . metoprolol succinate  50 mg Oral Daily  . mirtazapine  15 mg Oral QHS  . multivitamin with minerals  1 tablet Oral Daily  . pantoprazole  40 mg Oral Daily  . pravastatin  40 mg Oral QHS  . predniSONE  40 mg Oral Q breakfast   Continuous Infusions: . cefTRIAXone (ROCEPHIN)  IV 1 g (09/21/20 1835)  . doxycycline (VIBRAMYCIN) IV 100 mg (09/22/20 1416)     LOS: 4 days    Time spent: over 30 min    Fayrene Helper, MD [facility]   To contact the attending provider between 7A-7P or the covering provider during after hours 7P-7A, please log into the web site [email or web address] and access using universal [place] password for that web  site. If you do not have the password, please call the hospital operator.  09/22/2020, 2:44 PM

## 2020-09-23 DIAGNOSIS — J441 Chronic obstructive pulmonary disease with (acute) exacerbation: Secondary | ICD-10-CM | POA: Diagnosis not present

## 2020-09-23 LAB — COMPREHENSIVE METABOLIC PANEL
ALT: 25 U/L (ref 0–44)
AST: 26 U/L (ref 15–41)
Albumin: 2.6 g/dL — ABNORMAL LOW (ref 3.5–5.0)
Alkaline Phosphatase: 76 U/L (ref 38–126)
Anion gap: 9 (ref 5–15)
BUN: 15 mg/dL (ref 8–23)
CO2: 31 mmol/L (ref 22–32)
Calcium: 8.6 mg/dL — ABNORMAL LOW (ref 8.9–10.3)
Chloride: 94 mmol/L — ABNORMAL LOW (ref 98–111)
Creatinine, Ser: 0.64 mg/dL (ref 0.44–1.00)
GFR, Estimated: >60 mL/min (ref >60)
Glucose, Bld: 77 mg/dL (ref 70–99)
Potassium: 3.9 mmol/L (ref 3.5–5.1)
Sodium: 134 mmol/L — ABNORMAL LOW (ref 135–145)
Total Bilirubin: 0.5 mg/dL (ref 0.3–1.2)
Total Protein: 5.6 g/dL — ABNORMAL LOW (ref 6.5–8.1)

## 2020-09-23 LAB — CBC WITH DIFFERENTIAL/PLATELET
Abs Immature Granulocytes: 0.05 10*3/uL (ref 0.00–0.07)
Basophils Absolute: 0 10*3/uL (ref 0.0–0.1)
Basophils Relative: 0 %
Eosinophils Absolute: 0.1 10*3/uL (ref 0.0–0.5)
Eosinophils Relative: 2 %
HCT: 27.2 % — ABNORMAL LOW (ref 36.0–46.0)
Hemoglobin: 8.9 g/dL — ABNORMAL LOW (ref 12.0–15.0)
Immature Granulocytes: 1 %
Lymphocytes Relative: 26 %
Lymphs Abs: 1.8 10*3/uL (ref 0.7–4.0)
MCH: 27.6 pg (ref 26.0–34.0)
MCHC: 32.7 g/dL (ref 30.0–36.0)
MCV: 84.5 fL (ref 80.0–100.0)
Monocytes Absolute: 0.6 10*3/uL (ref 0.1–1.0)
Monocytes Relative: 9 %
Neutro Abs: 4.5 10*3/uL (ref 1.7–7.7)
Neutrophils Relative %: 62 %
Platelets: 238 10*3/uL (ref 150–400)
RBC: 3.22 MIL/uL — ABNORMAL LOW (ref 3.87–5.11)
RDW: 16.2 % — ABNORMAL HIGH (ref 11.5–15.5)
WBC: 7.1 10*3/uL (ref 4.0–10.5)
nRBC: 0 % (ref 0.0–0.2)

## 2020-09-23 LAB — CULTURE, BLOOD (ROUTINE X 2)
Culture: NO GROWTH
Culture: NO GROWTH

## 2020-09-23 LAB — PHOSPHORUS: Phosphorus: 3.2 mg/dL (ref 2.5–4.6)

## 2020-09-23 LAB — GLUCOSE, CAPILLARY
Glucose-Capillary: 70 mg/dL (ref 70–99)
Glucose-Capillary: 78 mg/dL (ref 70–99)
Glucose-Capillary: 91 mg/dL (ref 70–99)

## 2020-09-23 LAB — MAGNESIUM: Magnesium: 1.6 mg/dL — ABNORMAL LOW (ref 1.7–2.4)

## 2020-09-23 MED ORDER — MAGNESIUM SULFATE 2 GM/50ML IV SOLN
2.0000 g | Freq: Once | INTRAVENOUS | Status: AC
  Administered 2020-09-23: 2 g via INTRAVENOUS
  Filled 2020-09-23: qty 50

## 2020-09-23 MED ORDER — PREDNISONE 10 MG PO TABS: ORAL_TABLET | ORAL | 0 refills | Status: AC

## 2020-09-23 NOTE — Discharge Summary (Addendum)
Physician Discharge Summary  [name]:[id number] DOB: 1946/02/14 DOA: 09/18/2020  PCP: Isaac Bliss, Rayford Halsted, MD  Admit date: 09/18/2020 Discharge date: 09/23/2020  Time spent: 40 minutes  Recommendations for Outpatient Follow-up:  1. Follow outpatient CBC 2. Follow legionella PCR (pending at discharge) 3. Follow with pulmonologist outpatient  4. Follow repeat chest imaging outpatient per PCP/pulmonology  5. Consider outpatient cardiac monitoring, concern for afib by admitting provider, but not noted on my eval of telemetry or EKG's during admissino   Discharge Diagnoses:  Active Problems:   COPD exacerbation [facility])   Discharge Condition: stable  Diet recommendation: heart healthy  Filed Weights   09/19/20 0247 09/19/20 0550  Weight: 50 kg 48.9 kg    History of present illness:  [name] Penny Hargravesis Cele Mote 75 y.o.femalewith medical history significant ofCOPD on chronic home O2 (2L [place]), sarcoidosis, HTN who presented with weakness and SOB.  On day of admission was lethargic and found to be hypoglycemic.  She had imaging findings concerning for atypical pneumonia.  She's been admitted for COPD exacerbation with possible atypical pneumonia.    She's improved with steroids and antibiotics.  She's in stable condition for discharge on 2/13.   Hospital Course:  Atypical PNA Pulmonary fibrosis Sarcoidosis COPD exacerbation w/ chronic O2 supplementation (2L [place]) - on 3 L [place]     - wheezing improved today, hopefully ready for d/c 2/13 am - CTA PE without PE, but extensive fibrotic change with upper lobe predominance (c/w fibrosis 2/2 longstanding sarcoidosis change), bilateral pleural effusions (smaller than recent CT), ill defined air space opacity in the lower lobes.  Possible atypical pneumonia.   (see report)     - negative influenza, covid testing     - scheduled and prn nebs     - steroids      - doxy/ceftriaxone (s/p 5 days abx)     - follow sputum cx -  continue duonebs, steroids - check sputum (pending)/urine strep (negative)/urine legionella (pending)  Hypoglycemia Severe protein calorie malnutrition - resolved  Hyponatremia - improved, continue to monitor  Hypokalemia  Hypophosphatemia  Hypomagnesemia     - replace and follow  Normocytic anemia - no evidence of bleed, follow  Generalized weakness - exam non-focal - PT/OT consult -> recommending HHPT and rolling walker  Anxiety - continue zoloft  Hypertension      - amlodipine, metop  Concern for afib by admitting provider.  No recorded Harry Shuck fib in chart.  Sinus on review of EKG/tele. Consider following outpatient with monitor if concerns.   Procedures: none  Consultations:  none  Discharge Exam: Vitals:   09/23/20 0837 09/23/20 1119  BP:  (!) 153/82  Pulse:  93  Resp:  18  Temp:  98.5 F (36.9 C)  SpO2: 98% 100%   No new complaints Brynn Mulgrew little apprehensive about ability to go home, but worked with Therapist, sports and sounds like she was feeling better about it Discussed d/c with daughter over phone  General: No acute distress. Cardiovascular: Heart sounds show Wade Asebedo regular rate, and rhythm Lungs: rare, scattered wheezing - improved, no increased WOB Abdomen: Soft, nontender, nondistended Neurological: Alert and oriented 3. Moves all extremities 4 with equal strength. Cranial nerves II through XII grossly intact. Skin: Warm and dry. No rashes or lesions. Extremities: No clubbing or cyanosis. No edema  Discharge Instructions   Discharge Instructions    Diet - low sodium heart healthy   Complete by: As directed    Discharge instructions  Complete by: As directed    You were admitted for [name] COPD exacerbation and an atypical pneumonia.  You've improved with steroids and antibiotics.    We'll send you home with as steroid taper, you've completed Keevin Panebianco course of antibiotics.    Follow up your CT scan with your PCP and pulmonologist.  You  may need additional follow up imaging.  Please follow up with your lung doctors as an outpatient.    There was concern for possible abnormal heart rhythm, but your telemetry and ekg's looked reassuring here.  Consider an outpatient event monitor with your PCP or cardiology.   Return for new, recurrent, or worsening symptoms.  Please ask your PCP to request records from this hospitalization so they know what was done and what the next steps will be.   Increase activity slowly   Complete by: As directed      Allergies as of 09/23/2020      Reactions   Ace Inhibitors Swelling   Lisinopril Swelling   REACTION: Angioedema   Valsartan Swelling   REACTION: angioedema.  Pt should not get ACEI or ARBs of any kind due to angioedema.   Aspirin Other (See Comments)   REACTION: GI ulcer.   Penicillins    Childhood allergy Has patient had [name] PCN reaction causing immediate rash, facial/tongue/throat swelling, SOB or lightheadedness with hypotension: Unknown Has patient had [name] PCN reaction causing severe rash involving mucus membranes or skin necrosis: Unknown Has patient had [name] PCN reaction that required hospitalization: Unknown Has patient had [name] PCN reaction occurring within the last 10 years: Unknown If all of the above answers are "NO", then may proceed with Cephalosporin use.      Medication List    STOP taking these medications   celecoxib 50 MG capsule Commonly known as: CELEBREX   ibuprofen 200 MG tablet Commonly known as: ADVIL     TAKE these medications   acetaminophen 500 MG tablet Commonly known as: TYLENOL Take 1,000 mg by mouth every 6 (six) hours as needed for moderate pain.   albuterol 108 (90 Base) MCG/ACT inhaler Commonly known as: VENTOLIN HFA Inhale 1-2 puffs into the lungs every 6 (six) hours as needed for wheezing or shortness of breath. What changed: Another medication with the same name was changed. Make sure you understand how and when to take each.   albuterol  (2.5 MG/3ML) 0.083% nebulizer solution Commonly known as: PROVENTIL Take 3 mLs (2.5 mg total) by nebulization every 6 (six) hours as needed. As needed for SOB What changed:   reasons to take this  additional instructions   amLODipine 5 MG tablet Commonly known as: NORVASC Take 1 tablet (5 mg total) by mouth daily.   famotidine 20 MG tablet Commonly known as: PEPCID Take 20 mg by mouth 2 (two) times daily.   gabapentin 100 MG capsule Commonly known as: NEURONTIN Take 100 mg by mouth 3 (three) times daily.   ipratropium-albuterol 0.5-2.5 (3) MG/3ML Soln Commonly known as: DUONEB USE 3 ML VIA NEBULIZER EVERY 4 HOURS AS NEEDED   loratadine 10 MG tablet Commonly known as: CLARITIN Take 10 mg by mouth daily.   Magnesium 500 MG Tabs Take 500 mg by mouth at bedtime.   melatonin 5 MG Tabs Take 10 mg by mouth at bedtime.   metoprolol succinate 50 MG 24 hr tablet Commonly known as: TOPROL-XL TAKE 1 TABLET BY MOUTH EVERY DAY TAKE WITH OR IMMEDIATELY FOLLOWING Prynce Jacober MEAL. What changed:   how much to  take  how to take this  when to take this  additional instructions   mirtazapine 15 MG tablet Commonly known as: REMERON Take 15 mg by mouth at bedtime.   pantoprazole 40 MG tablet Commonly known as: PROTONIX Take 1 tablet (40 mg total) by mouth daily.   potassium citrate 10 MEQ (1080 MG) SR tablet Commonly known as: UROCIT-K Take 10 mEq by mouth daily.   pravastatin 40 MG tablet Commonly known as: PRAVACHOL TAKE 1 TABLET BY MOUTH EVERY DAY IN THE EVENING What changed:   how much to take  how to take this  when to take this  additional instructions   predniSONE 10 MG tablet Commonly known as: DELTASONE Take 3 tablets (30 mg total) by mouth daily for 2 days, THEN 2 tablets (20 mg total) daily for 2 days, THEN 1 tablet (10 mg total) daily for 2 days. Start taking on: September 23, 2020 What changed: See the new instructions.   Vitamin D (Ergocalciferol) 1.25 MG  (50000 UNIT) Caps capsule Commonly known as: DRISDOL Take 50,000 Units by mouth once Heiley Shaikh week.   Wixela Inhub 250-50 MCG/DOSE Aepb Generic drug: Fluticasone-Salmeterol Inhale 1 puff into the lungs 2 (two) times daily.            Durable Medical Equipment  (From admission, onward)         Start     Ordered   09/23/20 1145  For home use only DME Walker rolling  Once       Question Answer Comment  Walker: With 5 Inch Wheels   Patient needs Junko Ohagan walker to treat with the following condition Physical deconditioning      09/23/20 1144         Allergies  Allergen Reactions  . Ace Inhibitors Swelling  . Lisinopril Swelling    REACTION: Angioedema  . Valsartan Swelling    REACTION: angioedema.  Pt should not get ACEI or ARBs of any kind due to angioedema.  . Aspirin Other (See Comments)    REACTION: GI ulcer.  . Penicillins     Childhood allergy Has patient had Kaidence [place] PCN reaction causing immediate rash, facial/tongue/throat swelling, SOB or lightheadedness with hypotension: Unknown Has patient had Ryun Velez PCN reaction causing severe rash involving mucus membranes or skin necrosis: Unknown Has patient had Shawntee Mainwaring PCN reaction that required hospitalization: Unknown Has patient had Tichina Koebel PCN reaction occurring within the last 10 years: Unknown If all of the above answers are "NO", then may proceed with Cephalosporin use.       The results of significant diagnostics from this hospitalization (including imaging, microbiology, ancillary and laboratory) are listed below for reference.    Significant Diagnostic Studies: DG Ribs Unilateral W/Chest Left  Result Date: 09/21/2020 CLINICAL DATA:  [place] from her bed 5 days ago. Left posterior chest wall pain. EXAM: LEFT RIBS AND CHEST - 3+ VIEW COMPARISON:  09/18/2020.  CT and chest radiograph. FINDINGS: No convincing fracture.  No bone lesion. Lungs demonstrate extensive chronic changes of pleural and parenchymal scarring and bronchiectasis, without  significant change from the prior exams. No convincing pleural effusion or pneumothorax. Multiple calcified mediastinal and hilar lymph nodes. IMPRESSION: 1. No convincing rib fracture or rib lesion. 2. No acute cardiopulmonary disease. Electronically Signed   By: Lajean Manes M.D.   On: 09/21/2020 20:58   CT Angio Chest PE W/Cm &/Or Wo Cm  Result Date: 09/18/2020 CLINICAL DATA:  Shortness of breath and fever. History of sarcoidosis EXAM: CT ANGIOGRAPHY CHEST  WITH CONTRAST TECHNIQUE: Multidetector CT imaging of the chest was performed using the standard protocol during bolus administration of intravenous contrast. Multiplanar CT image reconstructions and MIPs were obtained to evaluate the vascular anatomy. CONTRAST:  133mL OMNIPAQUE IOHEXOL 350 MG/ML SOLN COMPARISON:  Chest CT August 28, 2019; chest radiograph September 18, 2020. Chest CT July 02, 2018 FINDINGS: Cardiovascular: There is no demonstrable pulmonary embolus. No thoracic aortic aneurysm or dissection. There are scattered foci great vessel calcification. There are foci of aortic atherosclerosis as well as foci of coronary artery calcification. There is no pericardial effusion or pericardial thickening. Mediastinum/Nodes: Thyroid appears unremarkable. There are multiple calcified hilar and mediastinal lymph nodes, present previously and consistent with known changes of sarcoidosis. There is no frank adenopathy by size criteria. No esophageal lesions are appreciable. Lungs/Pleura: There is Averill Pons focal right pleural effusion with Dawanda Mapel smaller pleural effusion on the left. Pleural effusions at this time are smaller than seen on the prior CT from 2021. There is extensive fibrosis throughout the lungs with Evette Diclemente significant upper lobe prominence, Deontez Klinke finding commonly associated with sarcoidosis. There is no well-defined edema. There are scattered areas of ill-defined airspace opacity in the lower lobe regions which are less prominent than on most recent CT but are  slightly more prominent than on the 2019 study. Question Dayanne Yiu degree of atypical organism pneumonia superimposed on sarcoidosis changes. Note that Torrez Renfroe degree of pneumonia superimposed with the fibrosis which is present is difficult to exclude by imaging. Upper Abdomen: There is Mari Battaglia persistent focal soft tissue opacity immediately adjacent to the stomach at the level of the crus of the left hemidiaphragm measuring 2.7 x 2.6 cm, stable compared to most recent study and smaller compared to prior CT from 2019. There is hepatic steatosis. Visualized upper abdominal structures otherwise appear unremarkable. Musculoskeletal: There are foci of degenerative change in the thoracic spine. There are no blastic or lytic bone lesions. No evident chest wall lesions. Review of the MIP images confirms the above findings. IMPRESSION: 1. No demonstrable pulmonary embolus. No thoracic aortic aneurysm or dissection. There are foci of aortic atherosclerosis and foci of coronary artery calcification. 2. Extensive fibrotic change with upper lobe predominance, an appearance consistent with fibrosis secondary to longstanding sarcoidosis change. Pleural effusions bilaterally, smaller compared to most recent CT. Areas of ill-defined airspace opacity in the lower lobes are more prominent than on 2019 study but less prominent compared to 2021 study. It is possible that there is atypical organism pneumonia currently present superimposed on sarcoidosis change. Note that pneumonia superimposed on the fibrosis in the upper lobes could easily be obscured in this circumstance. The overall appearance of the lungs does warrant check of COVID-19 status at this time. 3. Extensive lymph node calcification consistent with prior sarcoidosis. No frank adenopathy currently. 4. Soft tissue opacity immediately adjacent the stomach has been present previously and is likely of benign etiology. Question lymph node enlargement secondary to chronic sarcoidosis. No new  opacity of this nature seen elsewhere. Aortic Atherosclerosis (ICD10-I70.0). Electronically Signed   By: Lowella Grip III M.D.   On: 09/18/2020 12:14   DG Chest Port 1 View  Result Date: 09/18/2020 CLINICAL DATA:  Cough and weakness. EXAM: PORTABLE CHEST 1 VIEW COMPARISON:  CT chest 08/28/2019, chest radiographs 08/27/2019. FINDINGS: No substantial change in severe chronic lung disease with multifocal scarring and bilateral hilar retraction. Left greater than right basilar region are not substantially changed. Similar cardiomediastinal silhouette. Similar calcified mediastinal and hilar lymph nodes, consistent with sarcoidosis.  Similar blunting of bilateral costophrenic sulci, representing small pleural effusions versus pleural scarring. IMPRESSION: 1. Radiographically, no substantial change in left greater than right basilar opacities which could represent infection or edema. CT of the chest could further evaluate if clinically indicated. 2. Similar severe chronic lung disease with multifocal scarring, fibrosis and calcified mediastinal nodes, compatible with advanced sarcoidosis. Electronically Signed   By: Margaretha Sheffield MD   On: 09/18/2020 10:15    Microbiology: Recent Results (from the past 240 hour(s))  Resp Panel by RT-PCR (Flu Margarita Bobrowski&B, Covid) Nasopharyngeal Swab     Status: None   Collection Time: 09/18/20  8:46 AM   Specimen: Nasopharyngeal Swab; Nasopharyngeal(NP) swabs in vial transport medium  Result Value Ref Range Status   SARS Coronavirus 2 by RT PCR NEGATIVE NEGATIVE Final    Comment: (NOTE) SARS-CoV-2 target nucleic acids are NOT DETECTED.  The SARS-CoV-2 RNA is generally detectable in upper respiratory specimens during the acute phase of infection. The lowest concentration of SARS-CoV-2 viral copies this assay can detect is 138 copies/mL. Machell Wirthlin negative result does not preclude SARS-Cov-2 infection and should not be used as the sole basis for treatment or other patient  management decisions. Deadra Diggins negative result may occur with  improper specimen collection/handling, submission of specimen other than nasopharyngeal swab, presence of viral mutation(s) within the areas targeted by this assay, and inadequate number of viral copies(<138 copies/mL). Armaan Pond negative result must be combined with clinical observations, patient history, and epidemiological information. The expected result is Negative.  Fact Sheet for Patients:  EntrepreneurPulse.com.au  Fact Sheet for Healthcare Providers:  IncredibleEmployment.be  This test is no t yet approved or cleared by the [place] FDA and  has been authorized for detection and/or diagnosis of SARS-CoV-2 by FDA under an Emergency Use Authorization (EUA). This EUA will remain  in effect (meaning this test can be used) for the duration of the COVID-19 declaration under Section 564(b)(1) of the Act, 21 U.S.C.section 360bbb-3(b)(1), unless the authorization is terminated  or revoked sooner.       Influenza Raeanna Soberanes by PCR NEGATIVE NEGATIVE Final   Influenza B by PCR NEGATIVE NEGATIVE Final    Comment: (NOTE) The Xpert Xpress SARS-CoV-2/FLU/RSV plus assay is intended as an aid in the diagnosis of influenza from Nasopharyngeal swab specimens and should not be used as Jakaree Pickard sole basis for treatment. Nasal washings and aspirates are unacceptable for Xpert Xpress SARS-CoV-2/FLU/RSV testing.  Fact Sheet for Patients: EntrepreneurPulse.com.au  Fact Sheet for Healthcare Providers: IncredibleEmployment.be  This test is not yet approved or cleared by the [place] FDA and has been authorized for detection and/or diagnosis of SARS-CoV-2 by FDA under an Emergency Use Authorization (EUA). This EUA will remain in effect (meaning this test can be used) for the duration of the COVID-19 declaration under Section 564(b)(1) of the Act, 21 U.S.C. section 360bbb-3(b)(1),  unless the authorization is terminated or revoked.  Performed at [facility], [place] [address] [place], [place] [address]   Blood Culture (routine x 2)     Status: None (Preliminary result)   Collection Time: 09/18/20  8:50 AM   Specimen: BLOOD RIGHT HAND  Result Value Ref Range Status   Specimen Description   Final    BLOOD RIGHT HAND Performed at [place] [address] [place], [place] [address]    Special Requests   Final    BOTTLES DRAWN AEROBIC AND ANAEROBIC Blood Culture results may not be optimal due to an inadequate volume  of blood received in culture bottles Performed at [facility], [place] [address], [place], [place] [address]    Culture   Final    NO GROWTH 4 DAYS Performed at [facility], [place] [address] [place], [place] [address]    Report Status PENDING  Incomplete  Blood Culture (routine x 2)     Status: None (Preliminary result)   Collection Time: 09/18/20  9:27 AM   Specimen: BLOOD  Result Value Ref Range Status   Specimen Description   Final    BLOOD BLOOD RIGHT FOREARM Performed at [place] [address], [place], [place] [address]    Special Requests   Final    BOTTLES DRAWN AEROBIC AND ANAEROBIC Blood Culture results may not be optimal due to an inadequate volume of blood received in culture bottles Performed at [place] [address], [place], [place] [address]    Culture   Final    NO GROWTH 4 DAYS Performed at [facility], [place] [address] [place], [place] [address]    Report Status PENDING  Incomplete     Labs: Basic Metabolic Panel: Recent Labs  Lab 09/19/20 1337 09/20/20 0418 09/21/20 0414 09/22/20 0419 09/23/20 0400  NA 129* 130* 137 136 134*  K 4.8 3.7 3.1* 3.4* 3.9  CL 92* 91* 93* 91* 94*  CO2 28 28 36* 35* 31  GLUCOSE 128* 162* 90 75 77  BUN 10 12 14 14 15   CREATININE 0.72 0.68 0.68 0.65 0.64  CALCIUM 8.9  8.5* 8.4* 8.1* 8.6*  MG  --  1.6* 1.4* 1.7 1.6*  PHOS  --  1.4* 1.8* 3.9 3.2   Liver Function Tests: Recent Labs  Lab 09/19/20 0613 09/20/20 0418 09/21/20 0414 09/22/20 0419 09/23/20 0400  AST 63* 49* 44* 30 26  ALT 34 30 28 26 25   ALKPHOS 99 91 96 81 76  BILITOT 1.0 0.5 0.4 0.5 0.5  PROT 6.5 6.2* 6.3* 5.5* 5.6*  ALBUMIN 3.0* 2.8* 2.8* 2.6* 2.6*   No results for input(s): LIPASE, AMYLASE in the last 168 hours. No results for input(s): AMMONIA in the last 168 hours. CBC: Recent Labs  Lab 09/18/20 0927 09/19/20 7510 09/20/20 0418 09/21/20 0414 09/22/20 0419 09/23/20 0400  WBC 8.1 5.9 5.0 7.0 6.6 7.1  NEUTROABS 6.6  --  4.4 4.9 4.3 4.5  HGB 9.6* 9.5* 9.1* 9.0* 8.6* 8.9*  HCT 27.8* 27.9* 27.6* 27.4* 26.3* 27.2*  MCV 81.3 82.5 85.2 84.6 84.8 84.5  PLT 256 215 196 213 212 238   Cardiac Enzymes: No results for input(s): CKTOTAL, CKMB, CKMBINDEX, TROPONINI in the last 168 hours. BNP: BNP (last 3 results) Recent Labs    09/18/20 0927  BNP 496.8*    ProBNP (last 3 results) No results for input(s): PROBNP in the last 8760 hours.  CBG: Recent Labs  Lab 09/22/20 2006 09/22/20 2332 09/23/20 0436 09/23/20 0748 09/23/20 1117  GLUCAP 123* 93 70 78 91       Signed:  Fayrene Helper MD.  [facility] 09/23/2020, 2:27 PM

## 2020-09-23 NOTE — TOC Progression Note (Signed)
Transition of Care [facility]) - Progression Note    Patient Details  Name: [name] MRN: [id number] Date of Birth: Jan 16, 1946  Transition of Care [facility]) CM/SW Contact  Joaquin Courts, RN Phone Number: 09/23/2020, 3:16 PM  Clinical Narrative:    Alvis Lemmings to provide HHPT/aide services. Patient has RW at home.   Expected Discharge Plan: [place] Barriers to Discharge: No Barriers Identified  Expected Discharge Plan and Services Expected Discharge Plan: [place]   Discharge Planning Services: CM Consult Post Acute Care Choice: [place] arrangements for the past 2 months: Single Family Home Expected Discharge Date: 09/23/20                         HH Arranged: PT,Nurse's Aide [place]: [place] Date [facility] Agency Contacted: 09/23/20 Time Rafael Gonzalez: 1516 Representative spoke with at [place]: [place] ([place]) Interventions    Readmission Risk Interventions No flowsheet data found.

## 2020-09-24 ENCOUNTER — Telehealth: Payer: Self-pay | Admitting: Internal Medicine

## 2020-09-24 NOTE — Telephone Encounter (Signed)
Transition Care Management Unsuccessful Follow-up Telephone Call  Date of discharge and from where:  09/23/2020 from [facility]   Attempts:  1st Attempt  Reason for unsuccessful TCM follow-up call:  Left voice message     ,

## 2020-09-25 LAB — LEGIONELLA PNEUMOPHILA SEROGP 1 UR AG: L. pneumophila Serogp 1 Ur Ag: NEGATIVE

## 2020-09-25 NOTE — Telephone Encounter (Signed)
Transition Care Management Unsuccessful Follow-up Telephone Call  Date of discharge and from where:  09/23/2020 from [facility]   Attempts:  2nd Attempt  Reason for unsuccessful TCM follow-up call:  Left voice message

## 2020-09-28 NOTE — Telephone Encounter (Signed)
Transition Care Management Unsuccessful Follow-up Telephone Call  Date of discharge and from where:  09/23/2020 from [facility]   Attempts:  3rd Attempt  Reason for unsuccessful TCM follow-up call:  Left voice message

## 2020-10-10 ENCOUNTER — Other Ambulatory Visit: Payer: Self-pay

## 2020-10-11 ENCOUNTER — Ambulatory Visit (INDEPENDENT_AMBULATORY_CARE_PROVIDER_SITE_OTHER): Payer: Medicare Other | Admitting: Internal Medicine

## 2020-10-11 VITALS — BP 110/70 | HR 107 | Temp 98.3°F | Wt 108.5 lb

## 2020-10-11 DIAGNOSIS — J9621 Acute and chronic respiratory failure with hypoxia: Secondary | ICD-10-CM

## 2020-10-11 DIAGNOSIS — I272 Pulmonary hypertension, unspecified: Secondary | ICD-10-CM | POA: Diagnosis not present

## 2020-10-11 DIAGNOSIS — J432 Centrilobular emphysema: Secondary | ICD-10-CM

## 2020-10-11 DIAGNOSIS — E785 Hyperlipidemia, unspecified: Secondary | ICD-10-CM

## 2020-10-11 DIAGNOSIS — I10 Essential (primary) hypertension: Secondary | ICD-10-CM | POA: Diagnosis not present

## 2020-10-11 DIAGNOSIS — I50813 Acute on chronic right heart failure: Secondary | ICD-10-CM

## 2020-10-11 DIAGNOSIS — R600 Localized edema: Secondary | ICD-10-CM

## 2020-10-11 DIAGNOSIS — D869 Sarcoidosis, unspecified: Secondary | ICD-10-CM | POA: Diagnosis not present

## 2020-10-11 MED ORDER — AMLODIPINE BESYLATE 5 MG PO TABS: 5.0000 mg | ORAL_TABLET | Freq: Every day | ORAL | 1 refills | Status: DC

## 2020-10-11 MED ORDER — FUROSEMIDE 20 MG PO TABS: 20.0000 mg | ORAL_TABLET | Freq: Every day | ORAL | 1 refills | Status: DC

## 2020-10-11 MED ORDER — PRAVASTATIN SODIUM 40 MG PO TABS: ORAL_TABLET | ORAL | 1 refills | Status: DC

## 2020-10-11 MED ORDER — FAMOTIDINE 20 MG PO TABS: 20.0000 mg | ORAL_TABLET | Freq: Two times a day (BID) | ORAL | 1 refills | Status: DC

## 2020-10-11 MED ORDER — POTASSIUM CITRATE ER 10 MEQ (1080 MG) PO TBCR
10.0000 meq | EXTENDED_RELEASE_TABLET | Freq: Every day | ORAL | 1 refills | Status: DC
Start: 2020-10-11 — End: 2020-11-07

## 2020-10-11 MED ORDER — MAGNESIUM 500 MG PO TABS: 500.0000 mg | ORAL_TABLET | Freq: Every day | ORAL | 1 refills | Status: DC

## 2020-10-11 MED ORDER — LORATADINE 10 MG PO TABS: 10.0000 mg | ORAL_TABLET | Freq: Every day | ORAL | 1 refills | Status: DC

## 2020-10-11 MED ORDER — METOPROLOL SUCCINATE ER 50 MG PO TB24: ORAL_TABLET | ORAL | 1 refills | Status: DC

## 2020-10-11 NOTE — Progress Notes (Signed)
Established Patient Office Visit     This visit occurred during the SARS-CoV-2 public health emergency.  Safety protocols were in place, including screening questions prior to the visit, additional usage of staff PPE, and extensive cleaning of exam room while observing appropriate contact time as indicated for disinfecting solutions.    CC/Reason for Visit: Bilateral leg swelling, Hospital follow-up, follow-up chronic medical conditions  HPI: JYL CHICO is a 75 y.o. female who is coming in today for the above mentioned reasons. Past Medical History is significant for: Sarcoidosis, COPD and idiopathic pulmonary fibrosis with chronic respiratory failure on home oxygen, hyperlipidemia, hypertension, GERD.  I have not seen her in person since March 2020.  Since then she has been hospitalized a few times most recently February 8 through February 13.  She presented with shortness of breath and weakness.  She was thought to have COPD with acute exacerbation with an increase in oxygen requirement from 2 to 4 L.  Covid was negative, she was thought to possibly have atypical pneumonia and was treated with a full course of Rocephin and doxycycline.  She has overall improved since then.  She is here today because of bilateral lower extremity edema.  She wonders what this could be due to.  She has also noticed over a period of time, not necessarily since her hospitalization, progressive shortness of breath.  She does not have a pulmonologist that I can see in the system or a cardiologist.  Most recent echocardiogram on file is from October 2020 at which time the ejection fraction was 55 to 60% with indeterminate diastolic function however it was noted that the right ventricular systolic function was mildly reduced.   Past Medical/Surgical History: Past Medical History:  Diagnosis Date  . Blind left eye   . Cataract of both eyes 01/18/2009   Right surgically repaired  . COPD with emphysema ([place])  02/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  . Depression 06/08/2007  . Essential hypertension, benign 06/08/2007  . Family history of breast cancer   . GERD 01/18/2009  . Osteoporosis 12/31/2012   Left femur had T score -2.5 on DEXA scan 02/26/2009.   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 Sarcoidosis 02/05/2009   PFTs 03/30/2009: FVC 67%, FEV1 48%, FEV1/FVC 52%, TLC 104%, DLCO 71%.  . Vitamin D deficiency     Past Surgical History:  Procedure Laterality Date  . CATARACT EXTRACTION Right   . COLONOSCOPY  2002  . HIP ARTHROPLASTY Right   . TUBAL LIGATION      Social History:  reports that she quit smoking about 10 years ago. She has a 7.50 pack-year smoking history. She has never used smokeless tobacco. She reports current alcohol use. She reports that she does not use drugs.  Allergies: Allergies  Allergen Reactions  . Ace Inhibitors Swelling  . Lisinopril Swelling    REACTION: Angioedema  . Valsartan Swelling    REACTION: angioedema.  Pt should not get ACEI or ARBs of any kind due to angioedema.  . Aspirin Other (See Comments)    REACTION: GI ulcer.  . Penicillins     Childhood allergy Has patient had a PCN reaction causing immediate rash, facial/tongue/throat swelling, SOB or lightheadedness with hypotension: Unknown Has patient had a PCN reaction causing severe rash involving mucus membranes or skin necrosis: Unknown Has patient had a PCN reaction that required hospitalization: Unknown Has patient  had a PCN reaction occurring within the last 10 years: Unknown If all of the above answers are "NO", then may proceed with Cephalosporin use.     Family History:  Family History  Problem Relation Age of Onset  . Heart disease Mother   . Breast cancer Mother 23  . Stroke Father   . Heart disease Sister   . Asthma Daughter   . Cancer Other   . Diabetes Other   . Hypertension Other   . COPD Brother        "9/11  survivor"  . COPD Sister        "9/11 survivor"  . Breast cancer Maternal Grandmother        dx in her 31s  . Breast cancer Other        MGMs mother died in her 37s  . Colon cancer Neg Hx      Current Outpatient Medications:  .  acetaminophen (TYLENOL) 500 MG tablet, Take 1,000 mg by mouth every 6 (six) hours as needed for moderate pain., Disp: , Rfl:  .  albuterol (PROVENTIL) (2.5 MG/3ML) 0.083% nebulizer solution, Take 3 mLs (2.5 mg total) by nebulization every 6 (six) hours as needed. As needed for SOB (Patient taking differently: Take 2.5 mg by nebulization every 6 (six) hours as needed for wheezing or shortness of breath.), Disp: 75 mL, Rfl: 3 .  albuterol (VENTOLIN HFA) 108 (90 Base) MCG/ACT inhaler, Inhale 1-2 puffs into the lungs every 6 (six) hours as needed for wheezing or shortness of breath., Disp: 18 g, Rfl: 1 .  furosemide (LASIX) 20 MG tablet, Take 1 tablet (20 mg total) by mouth daily., Disp: 90 tablet, Rfl: 1 .  gabapentin (NEURONTIN) 100 MG capsule, Take 100 mg by mouth 3 (three) times daily., Disp: , Rfl:  .  ipratropium-albuterol (DUONEB) 0.5-2.5 (3) MG/3ML SOLN, USE 3 ML VIA NEBULIZER EVERY 4 HOURS AS NEEDED, Disp: 2970 mL, Rfl: 6 .  melatonin 5 MG TABS, Take 10 mg by mouth at bedtime., Disp: , Rfl:  .  mirtazapine (REMERON) 15 MG tablet, Take 15 mg by mouth at bedtime., Disp: , Rfl:  .  pantoprazole (PROTONIX) 40 MG tablet, Take 1 tablet (40 mg total) by mouth daily., Disp: 90 tablet, Rfl: 1 .  Vitamin D, Ergocalciferol, (DRISDOL) 1.25 MG (50000 UT) CAPS capsule, Take 50,000 Units by mouth once a week., Disp: , Rfl:  .  WIXELA INHUB 250-50 MCG/DOSE AEPB, Inhale 1 puff into the lungs 2 (two) times daily., Disp: , Rfl:  .  amLODipine (NORVASC) 5 MG tablet, Take 1 tablet (5 mg total) by mouth daily., Disp: 90 tablet, Rfl: 1 .  famotidine (PEPCID) 20 MG tablet, Take 1 tablet (20 mg total) by mouth 2 (two) times daily., Disp: 90 tablet, Rfl: 1 .  loratadine (CLARITIN) 10 MG  tablet, Take 1 tablet (10 mg total) by mouth daily., Disp: 90 tablet, Rfl: 1 .  Magnesium 500 MG TABS, Take 1 tablet (500 mg total) by mouth at bedtime., Disp: 90 tablet, Rfl: 1 .  metoprolol succinate (TOPROL-XL) 50 MG 24 hr tablet, TAKE 1 TABLET BY MOUTH EVERY DAY TAKE WITH OR IMMEDIATELY FOLLOWING A MEAL., Disp: 90 tablet, Rfl: 1 .  potassium citrate (UROCIT-K) 10 MEQ (1080 MG) SR tablet, Take 1 tablet (10 mEq total) by mouth daily., Disp: 90 tablet, Rfl: 1 .  pravastatin (PRAVACHOL) 40 MG tablet, TAKE 1 TABLET BY MOUTH EVERY DAY IN THE EVENING, Disp: 90 tablet, Rfl: 1  Review of Systems:  Constitutional: Denies fever, chills, diaphoresis, appetite change. HEENT: Denies photophobia, eye pain, redness, hearing loss, ear pain, congestion, sore throat, rhinorrhea, sneezing, mouth sores, trouble swallowing, neck pain, neck stiffness and tinnitus.   Respiratory: Denies cough, chest tightness,  and wheezing.   Cardiovascular: Denies chest pain, palpitations and leg swelling.  Gastrointestinal: Denies nausea, vomiting, abdominal pain, diarrhea, constipation, blood in stool and abdominal distention.  Genitourinary: Denies dysuria, urgency, frequency, hematuria, flank pain and difficulty urinating.  Endocrine: Denies: hot or cold intolerance, sweats, changes in hair or nails, polyuria, polydipsia. Musculoskeletal: Denies myalgias, back pain, joint swelling, arthralgias and gait problem.  Skin: Denies pallor, rash and wound.  Neurological: Denies dizziness, seizures, syncope,  light-headedness, numbness and headaches.  Hematological: Denies adenopathy. Easy bruising, personal or family bleeding history  Psychiatric/Behavioral: Denies suicidal ideation, mood changes, confusion, nervousness, sleep disturbance and agitation    Physical Exam: Vitals:   10/11/20 1418  BP: 110/70  Pulse: (!) 107  Temp: 98.3 F (36.8 C)  TempSrc: Oral  SpO2: 96%  Weight: 108 lb 8 oz (49.2 kg)    Body mass index  is 19.22 kg/m.   Constitutional: NAD, calm, comfortable, thin, wearing portable oxygen Eyes: PERRL, lids and conjunctivae normal ENMT: Mucous membranes are moist. Respiratory: clear to auscultation bilaterally, no wheezing, no crackles. Normal respiratory effort. No accessory muscle use.  Cardiovascular: Regular rate and rhythm, no murmurs / rubs / gallops.  1+ pitting bilateral lower extremity edema.   Neurologic: Grossly intact and nonfocal Psychiatric: Normal judgment and insight. Alert and oriented x 3. Normal mood.    Impression and Plan:  Sarcoidosis Centrilobular emphysema ([place]) Pulmonary hypertension ([place])  Acute on chronic respiratory failure with hypoxia (H[facility]) Acute on chronic right-sided heart failure (H[facility])  Bilateral leg edema  -I do believe her lower extremity edema and her progressive shortness of breath are likely due to to her chronic lung conditions including fibrosis, sarcoidosis, COPD with chronic respiratory failure and possibly pulmonary hypertension.  Echocardiogram from 2 years ago also shows decreased right ventricular systolic function which is likely the reason for her lower extremity edema. -I will send for updated 2D echo. -I will start on low-dose Lasix 20 mg daily. -Given her significant comorbidities she needs to have a local cardiologist and pulmonologist, I will send referrals today.  Dyslipidemia  - Plan: pravastatin (PRAVACHOL) 40 MG tablet  Essential hypertension, benign -Well-controlled today at 110/70.  - Plan: amLODipine (NORVASC) 5 MG tablet, metoprolol succinate (TOPROL-XL) 50 MG 24 hr tablet,    Patient Instructions  -Nice seeing you today!!  -Start lasix 20 mg daily.  -We will schedule an echocardiogram, referrals to cardiology and pulmonology.  -Schedule follow up with me in 3 months.     Lelon Frohlich, MD [facility]

## 2020-10-11 NOTE — Patient Instructions (Signed)
-  Nice seeing you today!!  -Start lasix 20 mg daily.  -We will schedule an echocardiogram, referrals to cardiology and pulmonology.  -Schedule follow up with me in 3 months.

## 2020-10-19 ENCOUNTER — Ambulatory Visit: Payer: Medicare Other | Admitting: Pulmonary Disease

## 2020-10-19 ENCOUNTER — Encounter: Payer: Self-pay | Admitting: Pulmonary Disease

## 2020-10-19 ENCOUNTER — Other Ambulatory Visit: Payer: Self-pay

## 2020-10-19 VITALS — BP 116/78 | HR 80 | Temp 98.0°F | Ht 63.0 in | Wt 114.0 lb

## 2020-10-19 DIAGNOSIS — R0602 Shortness of breath: Secondary | ICD-10-CM

## 2020-10-19 DIAGNOSIS — D869 Sarcoidosis, unspecified: Secondary | ICD-10-CM | POA: Diagnosis not present

## 2020-10-19 DIAGNOSIS — J431 Panlobular emphysema: Secondary | ICD-10-CM | POA: Diagnosis not present

## 2020-10-19 NOTE — Progress Notes (Signed)
[name]    [id number]    [birth year]  Primary Care Physician:Hernandez Everardo Beals, MD  Referring Physician: Isaac Bliss, Rayford Halsted, MD [place],  [place] [address]  Chief complaint:  Patient with a history of chronic obstructive pulmonary disease, shortness of breath Recently hospitalized with altered mental status  HPI:   Has not been seen in our office for over 2 years States her breathing has been relatively stable Recent hospitalization noted  Does have a history of COPD, history of sarcoidosis with scarring  Occasional cough with no significant sputum production at present  COPD diagnosis about 2003 History of sarcoidosis about the same time-did use a chronic course of steroids back then  Has recently lost some weight, poor appetite, this was associated with the time that she recently lost her husband about 3 years ago She is less active recently  She did have some swelling of the legs when she was recently in the hospital It was recommended that she follow-up with cardiology as well  Occupation: No pertinent history Exposures: Smoking history: Reformed smoker Travel history: Relevant family history:  Outpatient Encounter Medications as of 10/19/2020  Medication Sig  . acetaminophen (TYLENOL) 500 MG tablet Take 1,000 mg by mouth every 6 (six) hours as needed for moderate pain.  Marland Kitchen albuterol (PROVENTIL) (2.5 MG/3ML) 0.083% nebulizer solution Take 3 mLs (2.5 mg total) by nebulization every 6 (six) hours as needed. As needed for SOB (Patient taking differently: Take 2.5 mg by nebulization every 6 (six) hours as needed for wheezing or shortness of breath.)  . amLODipine (NORVASC) 5 MG tablet Take 1 tablet (5 mg total) by mouth daily.  . famotidine (PEPCID) 20 MG tablet Take 1 tablet (20 mg total) by mouth 2 (two) times daily.  . furosemide (LASIX) 20 MG tablet Take 1 tablet (20 mg total) by mouth daily.  Marland Kitchen gabapentin  (NEURONTIN) 100 MG capsule Take 100 mg by mouth 3 (three) times daily.  Marland Kitchen ipratropium-albuterol (DUONEB) 0.5-2.5 (3) MG/3ML SOLN USE 3 ML VIA NEBULIZER EVERY 4 HOURS AS NEEDED  . loratadine (CLARITIN) 10 MG tablet Take 1 tablet (10 mg total) by mouth daily.  . Magnesium 500 MG TABS Take 1 tablet (500 mg total) by mouth at bedtime.  . melatonin 5 MG TABS Take 10 mg by mouth at bedtime.  . metoprolol succinate (TOPROL-XL) 50 MG 24 hr tablet TAKE 1 TABLET BY MOUTH EVERY DAY TAKE WITH OR IMMEDIATELY FOLLOWING A MEAL.  . mirtazapine (REMERON) 15 MG tablet Take 15 mg by mouth at bedtime.  . pantoprazole (PROTONIX) 40 MG tablet Take 1 tablet (40 mg total) by mouth daily.  . potassium citrate (UROCIT-K) 10 MEQ (1080 MG) SR tablet Take 1 tablet (10 mEq total) by mouth daily.  . pravastatin (PRAVACHOL) 40 MG tablet TAKE 1 TABLET BY MOUTH EVERY DAY IN THE EVENING  . Vitamin D, Ergocalciferol, (DRISDOL) 1.25 MG (50000 UT) CAPS capsule Take 50,000 Units by mouth once a week.  Grant Ruts INHUB 250-50 MCG/DOSE AEPB Inhale 1 puff into the lungs 2 (two) times daily.  Marland Kitchen albuterol (VENTOLIN HFA) 108 (90 Base) MCG/ACT inhaler Inhale 1-2 puffs into the lungs every 6 (six) hours as needed for wheezing or shortness of breath.   No facility-administered encounter medications on file as of 10/19/2020.    Allergies as of 10/19/2020 - Review Complete 10/19/2020  Allergen Reaction Noted  . Ace inhibitors Swelling 10/09/2011  .  Lisinopril Swelling   . Valsartan Swelling   . Aspirin Other (See Comments)   . Penicillins  06/08/2007    Past Medical History:  Diagnosis Date  . Blind left eye   . Cataract of both eyes 01/18/2009   Right surgically repaired  . COPD with emphysema ([place]) 02/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  . Depression 06/08/2007  . Essential hypertension, benign 06/08/2007  . Family history of breast cancer   . GERD 01/18/2009  . Osteoporosis 12/31/2012    Left femur had T score -2.5 on DEXA scan 02/26/2009.   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 Sarcoidosis 02/05/2009   PFTs 03/30/2009: FVC 67%, FEV1 48%, FEV1/FVC 52%, TLC 104%, DLCO 71%.  . Vitamin D deficiency     Past Surgical History:  Procedure Laterality Date  . CATARACT EXTRACTION Right   . COLONOSCOPY  2002  . HIP ARTHROPLASTY Right   . TUBAL LIGATION      Family History  Problem Relation Age of Onset  . Heart disease Mother   . Breast cancer Mother 51  . Stroke Father   . Heart disease Sister   . Asthma Daughter   . Cancer Other   . Diabetes Other   . Hypertension Other   . COPD Brother        "9/11 survivor"  . COPD Sister        "9/11 survivor"  . Breast cancer Maternal Grandmother        dx in her 86s  . Breast cancer Other        MGMs mother died in her 58s  . Colon cancer Neg Hx     Social History   Socioeconomic History  . Marital status: Widowed    Spouse name: Not on file  . Number of children: Not on file  . Years of education: Not on file  . Highest education level: Not on file  Occupational History  . Not on file  Tobacco Use  . Smoking status: Former Smoker    Packs/day: 0.25    Years: 30.00    Pack years: 7.50    Quit date: 05/11/2010    Years since quitting: 10.4  . Smokeless tobacco: Never Used  Vaping Use  . Vaping Use: Never used  Substance and Sexual Activity  . Alcohol use: Yes    Alcohol/week: 0.0 standard drinks    Comment: wine cooler.  . Drug use: No  . Sexual activity: Not on file  Other Topics Concern  . Not on file  Social History Narrative   Married with 4 children. Retired, previously worked as a Scientist, clinical (histocompatibility and immunogenetics).   Social Determinants of Health   Financial Resource Strain: Not on file  Food Insecurity: Not on file  Transportation Needs: Not on file  Physical Activity: Not on file  Stress: Not on file  Social Connections: Not on file  Intimate Partner Violence: Not on file    Review  of Systems  Constitutional: Positive for appetite change and unexpected weight change.  HENT: Negative.   Eyes: Negative.   Respiratory: Positive for cough and shortness of breath.   Cardiovascular: Positive for leg swelling.  Gastrointestinal: Negative.   Endocrine: Negative.   Genitourinary: Negative.   Musculoskeletal: Negative.     Vitals:   10/19/20 1546  BP: 116/78  Pulse: 80  Temp: 98 F (36.7 C)  SpO2: 100%  Physical Exam Constitutional:      Appearance: She is well-developed. She is not diaphoretic.  HENT:     Head: Normocephalic and atraumatic.     Mouth/Throat:     Mouth: Mucous membranes are moist.  Eyes:     General:        Right eye: No discharge.        Left eye: No discharge.  Neck:     Thyroid: No thyromegaly.     Trachea: No tracheal deviation.  Cardiovascular:     Rate and Rhythm: Normal rate and regular rhythm.     Heart sounds: No murmur heard. No friction rub.  Pulmonary:     Effort: Pulmonary effort is normal. No respiratory distress.     Breath sounds: Rhonchi present. No wheezing.  Musculoskeletal:     Cervical back: No rigidity or tenderness.  Neurological:     Mental Status: She is alert.     Deep Tendon Reflexes: Reflexes are normal and symmetric.     Data Reviewed: Last chest x-ray on record was in 2013-infiltrative process biapically  Recent CT scan of the chest reviewed compared with CT scans as far back as 2011 showing bilateral scattered Does appear to be longstanding scarring from sarcoidosis  Assessment:  Advanced COPD  History of sarcoidosis  Pulmonary fibrosis from sarcoidosis   Plan/Recommendations:  Continue bronchodilator treatments  Encourage increase activity as tolerated  We will send prescription into medical supply company for portable concentrator  Tentative follow-up in about 6 months  Encouraged to call with any significant concerns   Sherrilyn Rist MD [facility] 10/19/2020, 3:55 PM  CC: Isaac Bliss, [place]*

## 2020-10-19 NOTE — Patient Instructions (Signed)
Encourage regular activity  Graded exercises as tolerated  Continue breathing treatment  Make sure you follow-up with a cardiologist  I will see you back in 6 months  Call if he has any significant concerns, I will see you sooner if any concerns

## 2020-10-22 ENCOUNTER — Telehealth: Payer: Self-pay | Admitting: Internal Medicine

## 2020-10-22 NOTE — Telephone Encounter (Signed)
[name] is calling and is requesting verbal orders for physical therapy for 1 week 4, please advise. CB is [phone]

## 2020-10-23 NOTE — Telephone Encounter (Signed)
Left detailed message on secured machine for [name] with verbal orders for PT.

## 2020-10-23 NOTE — Telephone Encounter (Signed)
Ok to order as requested 

## 2020-10-30 ENCOUNTER — Encounter: Payer: Self-pay | Admitting: General Practice

## 2020-11-07 ENCOUNTER — Telehealth: Payer: Self-pay | Admitting: Internal Medicine

## 2020-11-07 DIAGNOSIS — I50813 Acute on chronic right heart failure: Secondary | ICD-10-CM

## 2020-11-07 DIAGNOSIS — J439 Emphysema, unspecified: Secondary | ICD-10-CM

## 2020-11-07 DIAGNOSIS — E785 Hyperlipidemia, unspecified: Secondary | ICD-10-CM

## 2020-11-07 DIAGNOSIS — I10 Essential (primary) hypertension: Secondary | ICD-10-CM

## 2020-11-07 DIAGNOSIS — J432 Centrilobular emphysema: Secondary | ICD-10-CM

## 2020-11-07 MED ORDER — AMLODIPINE BESYLATE 5 MG PO TABS: 5.0000 mg | ORAL_TABLET | Freq: Every day | ORAL | 1 refills | Status: DC

## 2020-11-07 MED ORDER — FUROSEMIDE 20 MG PO TABS: 20.0000 mg | ORAL_TABLET | Freq: Every day | ORAL | 1 refills | Status: DC

## 2020-11-07 MED ORDER — PRAVASTATIN SODIUM 40 MG PO TABS: ORAL_TABLET | ORAL | 1 refills | Status: DC

## 2020-11-07 MED ORDER — POTASSIUM CITRATE ER 10 MEQ (1080 MG) PO TBCR: 10.0000 meq | EXTENDED_RELEASE_TABLET | Freq: Every day | ORAL | 1 refills | Status: DC

## 2020-11-07 MED ORDER — MAGNESIUM 500 MG PO TABS: 500.0000 mg | ORAL_TABLET | Freq: Every day | ORAL | 1 refills | Status: DC

## 2020-11-07 MED ORDER — LORATADINE 10 MG PO TABS: 10.0000 mg | ORAL_TABLET | Freq: Every day | ORAL | 1 refills | Status: DC

## 2020-11-07 MED ORDER — METOPROLOL SUCCINATE ER 50 MG PO TB24: ORAL_TABLET | ORAL | 1 refills | Status: DC

## 2020-11-07 MED ORDER — FAMOTIDINE 20 MG PO TABS: 20.0000 mg | ORAL_TABLET | Freq: Two times a day (BID) | ORAL | 1 refills | Status: DC

## 2020-11-07 NOTE — Telephone Encounter (Signed)
New refills sent

## 2020-11-07 NOTE — Addendum Note (Signed)
Addended by: Westley Hummer B on: 11/07/2020 04:16 PM   Modules accepted: Orders

## 2020-11-07 NOTE — Telephone Encounter (Signed)
[facility]- [place], [place] - [place], [place] - [address] Phone:  [phone]  Fax:  [phone]       They need her whole profile of the medications the Dr. Jerilee Hoh prescribes for her. They pre-package her medication for her.  Sent a fax on 10/15/2020 and hasn't received a response back.

## 2020-11-09 ENCOUNTER — Institutional Professional Consult (permissible substitution): Payer: Medicare Other | Admitting: Internal Medicine

## 2020-11-12 ENCOUNTER — Other Ambulatory Visit: Payer: Self-pay

## 2020-11-12 ENCOUNTER — Ambulatory Visit (HOSPITAL_COMMUNITY): Payer: Medicare Other | Attending: Internal Medicine

## 2020-11-12 DIAGNOSIS — I50813 Acute on chronic right heart failure: Secondary | ICD-10-CM | POA: Insufficient documentation

## 2020-11-12 LAB — ECHOCARDIOGRAM COMPLETE
Area-P 1/2: 2.37 cm2
S' Lateral: 2.2 cm

## 2020-11-15 ENCOUNTER — Telehealth: Payer: Self-pay | Admitting: Internal Medicine

## 2020-11-15 NOTE — Telephone Encounter (Signed)
Fax received and placed in Dr Ledell Noss folder

## 2020-11-15 NOTE — Telephone Encounter (Signed)
[name] is calling from [employer] and wanted to see if provider received an order for a portable concentrator for mobility, please advise. CB [phone]

## 2020-11-19 ENCOUNTER — Other Ambulatory Visit: Payer: Self-pay | Admitting: Internal Medicine

## 2020-11-19 DIAGNOSIS — J439 Emphysema, unspecified: Secondary | ICD-10-CM

## 2020-11-20 ENCOUNTER — Other Ambulatory Visit: Payer: Self-pay | Admitting: Internal Medicine

## 2020-11-20 DIAGNOSIS — J432 Centrilobular emphysema: Secondary | ICD-10-CM

## 2020-11-27 NOTE — Telephone Encounter (Signed)
Order faxed and confirmed.

## 2021-01-07 ENCOUNTER — Other Ambulatory Visit: Payer: Self-pay | Admitting: Internal Medicine

## 2021-01-07 DIAGNOSIS — I10 Essential (primary) hypertension: Secondary | ICD-10-CM

## 2021-02-14 ENCOUNTER — Telehealth: Payer: Self-pay | Admitting: Internal Medicine

## 2021-02-14 NOTE — Telephone Encounter (Signed)
[facility] call and stated pt need a refill on  melatonin 5 MG TABS sent to  [place], [place] - [address] Phone:  [phone]  Fax:  [phone]

## 2021-02-15 MED ORDER — MELATONIN 5 MG PO TABS: 10.0000 mg | ORAL_TABLET | Freq: Every day | ORAL | 1 refills | Status: DC

## 2021-02-15 NOTE — Telephone Encounter (Signed)
Refill sent.

## 2021-02-18 ENCOUNTER — Other Ambulatory Visit: Payer: Self-pay | Admitting: Internal Medicine

## 2021-03-12 ENCOUNTER — Other Ambulatory Visit: Payer: Self-pay | Admitting: Internal Medicine

## 2021-03-12 DIAGNOSIS — J439 Emphysema, unspecified: Secondary | ICD-10-CM

## 2021-03-12 DIAGNOSIS — J432 Centrilobular emphysema: Secondary | ICD-10-CM

## 2021-04-08 ENCOUNTER — Other Ambulatory Visit: Payer: Self-pay

## 2021-04-08 ENCOUNTER — Ambulatory Visit (HOSPITAL_COMMUNITY)
Admission: EM | Admit: 2021-04-08 | Discharge: 2021-04-09 | Disposition: A | Discharge status: Home / Self Care | Payer: Medicare Other | Attending: Student | Admitting: Student

## 2021-04-08 ENCOUNTER — Emergency Department (HOSPITAL_COMMUNITY): Payer: Medicare Other | Admitting: Anesthesiology

## 2021-04-08 ENCOUNTER — Encounter (HOSPITAL_COMMUNITY)
Admission: EM | Disposition: A | Discharge status: Home / Self Care | Payer: Self-pay | Source: Home / Self Care | Attending: Student

## 2021-04-08 ENCOUNTER — Encounter (HOSPITAL_COMMUNITY): Payer: Self-pay

## 2021-04-08 ENCOUNTER — Emergency Department (HOSPITAL_COMMUNITY): Payer: Medicare Other

## 2021-04-08 ENCOUNTER — Inpatient Hospital Stay: Admit: 2021-04-08 | Payer: Medicare Other | Admitting: Orthopaedic Surgery

## 2021-04-08 DIAGNOSIS — Z87891 Personal history of nicotine dependence: Secondary | ICD-10-CM | POA: Insufficient documentation

## 2021-04-08 DIAGNOSIS — J439 Emphysema, unspecified: Secondary | ICD-10-CM | POA: Diagnosis not present

## 2021-04-08 DIAGNOSIS — D869 Sarcoidosis, unspecified: Secondary | ICD-10-CM | POA: Insufficient documentation

## 2021-04-08 DIAGNOSIS — S73014A Posterior dislocation of right hip, initial encounter: Secondary | ICD-10-CM

## 2021-04-08 DIAGNOSIS — Z9981 Dependence on supplemental oxygen: Secondary | ICD-10-CM | POA: Diagnosis not present

## 2021-04-08 DIAGNOSIS — S73006A Unspecified dislocation of unspecified hip, initial encounter: Secondary | ICD-10-CM

## 2021-04-08 DIAGNOSIS — Z888 Allergy status to other drugs, medicaments and biological substances status: Secondary | ICD-10-CM | POA: Diagnosis not present

## 2021-04-08 DIAGNOSIS — Z886 Allergy status to analgesic agent status: Secondary | ICD-10-CM | POA: Insufficient documentation

## 2021-04-08 DIAGNOSIS — T84020A Dislocation of internal right hip prosthesis, initial encounter: Secondary | ICD-10-CM | POA: Insufficient documentation

## 2021-04-08 DIAGNOSIS — Z880 Allergy status to penicillin: Secondary | ICD-10-CM | POA: Insufficient documentation

## 2021-04-08 DIAGNOSIS — Y792 Prosthetic and other implants, materials and accessory orthopedic devices associated with adverse incidents: Secondary | ICD-10-CM | POA: Diagnosis not present

## 2021-04-08 DIAGNOSIS — M810 Age-related osteoporosis without current pathological fracture: Secondary | ICD-10-CM | POA: Diagnosis not present

## 2021-04-08 DIAGNOSIS — S73004A Unspecified dislocation of right hip, initial encounter: Secondary | ICD-10-CM

## 2021-04-08 DIAGNOSIS — Z419 Encounter for procedure for purposes other than remedying health state, unspecified: Secondary | ICD-10-CM

## 2021-04-08 HISTORY — PX: HIP CLOSED REDUCTION: SHX983

## 2021-04-08 LAB — CBC WITH DIFFERENTIAL/PLATELET
Abs Immature Granulocytes: 0.01 10*3/uL (ref 0.00–0.07)
Basophils Absolute: 0 10*3/uL (ref 0.0–0.1)
Basophils Relative: 0 %
Eosinophils Absolute: 0 10*3/uL (ref 0.0–0.5)
Eosinophils Relative: 1 %
HCT: 36.6 % (ref 36.0–46.0)
Hemoglobin: 12.6 g/dL (ref 12.0–15.0)
Immature Granulocytes: 0 %
Lymphocytes Relative: 19 %
Lymphs Abs: 0.9 10*3/uL (ref 0.7–4.0)
MCH: 29.4 pg (ref 26.0–34.0)
MCHC: 34.4 g/dL (ref 30.0–36.0)
MCV: 85.5 fL (ref 80.0–100.0)
Monocytes Absolute: 0.4 10*3/uL (ref 0.1–1.0)
Monocytes Relative: 7 %
Neutro Abs: 3.6 10*3/uL (ref 1.7–7.7)
Neutrophils Relative %: 73 %
Platelets: 254 10*3/uL (ref 150–400)
RBC: 4.28 MIL/uL (ref 3.87–5.11)
RDW: 16.4 % — ABNORMAL HIGH (ref 11.5–15.5)
WBC: 5 10*3/uL (ref 4.0–10.5)
nRBC: 0 % (ref 0.0–0.2)

## 2021-04-08 LAB — BASIC METABOLIC PANEL
Anion gap: 13 (ref 5–15)
BUN: 10 mg/dL (ref 8–23)
CO2: 29 mmol/L (ref 22–32)
Calcium: 9.1 mg/dL (ref 8.9–10.3)
Chloride: 89 mmol/L — ABNORMAL LOW (ref 98–111)
Creatinine, Ser: 1.12 mg/dL — ABNORMAL HIGH (ref 0.44–1.00)
GFR, Estimated: 52 mL/min — ABNORMAL LOW (ref >60)
Glucose, Bld: 101 mg/dL — ABNORMAL HIGH (ref 70–99)
Potassium: 3.7 mmol/L (ref 3.5–5.1)
Sodium: 131 mmol/L — ABNORMAL LOW (ref 135–145)

## 2021-04-08 SURGERY — CLOSED REDUCTION, HIP
Anesthesia: General | Site: Hip | Laterality: Right

## 2021-04-08 MED ORDER — DEXAMETHASONE SODIUM PHOSPHATE 10 MG/ML IJ SOLN
INTRAMUSCULAR | Status: AC
  Filled 2021-04-08: qty 1

## 2021-04-08 MED ORDER — ROCURONIUM BROMIDE 10 MG/ML (PF) SYRINGE
PREFILLED_SYRINGE | INTRAVENOUS | Status: AC
  Filled 2021-04-08: qty 10

## 2021-04-08 MED ORDER — MORPHINE SULFATE (PF) 4 MG/ML IV SOLN: 4.0000 mg | Freq: Once | INTRAVENOUS | Status: DC

## 2021-04-08 MED ORDER — HYDROCODONE-ACETAMINOPHEN 5-325 MG PO TABS: 1.0000 | ORAL_TABLET | Freq: Four times a day (QID) | ORAL | 0 refills | Status: DC | PRN

## 2021-04-08 MED ORDER — FENTANYL CITRATE (PF) 250 MCG/5ML IJ SOLN
INTRAMUSCULAR | Status: AC
  Filled 2021-04-08: qty 5

## 2021-04-08 MED ORDER — ONDANSETRON HCL 4 MG/2ML IJ SOLN
INTRAMUSCULAR | Status: DC | PRN
  Administered 2021-04-08: 4 mg via INTRAVENOUS

## 2021-04-08 MED ORDER — LIDOCAINE 2% (20 MG/ML) 5 ML SYRINGE
INTRAMUSCULAR | Status: DC | PRN
  Administered 2021-04-08: 60 mg via INTRAVENOUS

## 2021-04-08 MED ORDER — PHENYLEPHRINE HCL (PRESSORS) 10 MG/ML IV SOLN
INTRAVENOUS | Status: DC | PRN
  Administered 2021-04-08 (×2): 80 ug via INTRAVENOUS

## 2021-04-08 MED ORDER — OXYCODONE HCL 5 MG PO TABS: 5.0000 mg | ORAL_TABLET | Freq: Once | ORAL | Status: DC | PRN

## 2021-04-08 MED ORDER — LIDOCAINE 2% (20 MG/ML) 5 ML SYRINGE
INTRAMUSCULAR | Status: AC
  Filled 2021-04-08: qty 5

## 2021-04-08 MED ORDER — PROPOFOL 10 MG/ML IV BOLUS
INTRAVENOUS | Status: DC | PRN
  Administered 2021-04-08: 80 mg via INTRAVENOUS
  Administered 2021-04-08: 50 mg via INTRAVENOUS
  Administered 2021-04-08: 20 mg via INTRAVENOUS

## 2021-04-08 MED ORDER — PROPOFOL 10 MG/ML IV BOLUS
INTRAVENOUS | Status: AC | PRN
  Administered 2021-04-08: 30 mg via INTRAVENOUS
  Administered 2021-04-08: 20 mg via INTRAVENOUS
  Administered 2021-04-08: 30 mg via INTRAVENOUS
  Administered 2021-04-08: 20 mg via INTRAVENOUS

## 2021-04-08 MED ORDER — PROPOFOL 10 MG/ML IV BOLUS
50.0000 mg | Freq: Once | INTRAVENOUS | Status: AC
  Administered 2021-04-08: 30 mg via INTRAVENOUS
  Filled 2021-04-08: qty 20

## 2021-04-08 MED ORDER — SODIUM CHLORIDE 0.9 % IV SOLN: INTRAVENOUS | Status: DC | PRN

## 2021-04-08 MED ORDER — FENTANYL CITRATE (PF) 100 MCG/2ML IJ SOLN: 25.0000 ug | INTRAMUSCULAR | Status: DC | PRN

## 2021-04-08 MED ORDER — PROPOFOL 10 MG/ML IV BOLUS
INTRAVENOUS | Status: AC
  Filled 2021-04-08: qty 20

## 2021-04-08 MED ORDER — ONDANSETRON HCL 4 MG/2ML IJ SOLN
INTRAMUSCULAR | Status: AC
  Filled 2021-04-08: qty 2

## 2021-04-08 MED ORDER — PHENYLEPHRINE 40 MCG/ML (10ML) SYRINGE FOR IV PUSH (FOR BLOOD PRESSURE SUPPORT)
80.0000 ug | PREFILLED_SYRINGE | Freq: Once | INTRAVENOUS | Status: AC
  Administered 2021-04-08: 100 ug via INTRAVENOUS

## 2021-04-08 MED ORDER — FENTANYL CITRATE (PF) 250 MCG/5ML IJ SOLN
INTRAMUSCULAR | Status: DC | PRN
  Administered 2021-04-08: 50 ug via INTRAVENOUS

## 2021-04-08 MED ORDER — OXYCODONE HCL 5 MG/5ML PO SOLN
5.0000 mg | Freq: Once | ORAL | Status: DC | PRN
Start: 2021-04-08 — End: 2021-04-09

## 2021-04-08 MED ORDER — MIDAZOLAM HCL 2 MG/2ML IJ SOLN
INTRAMUSCULAR | Status: AC
  Filled 2021-04-08: qty 2

## 2021-04-08 SURGICAL SUPPLY — 12 items
BAG COUNTER SPONGE SURGICOUNT (BAG) ×2 IMPLANT
GLOVE SURG ENC MOIS LTX SZ8 (GLOVE) ×2 IMPLANT
GOWN STRL REUS W/ TWL LRG LVL3 (GOWN DISPOSABLE) IMPLANT
GOWN STRL REUS W/ TWL XL LVL3 (GOWN DISPOSABLE) ×2 IMPLANT
GOWN STRL REUS W/TWL LRG LVL3 (GOWN DISPOSABLE)
GOWN STRL REUS W/TWL XL LVL3 (GOWN DISPOSABLE) ×4
IMMOBILIZER KNEE 22  40 CIR (ORTHOPEDIC SUPPLIES) ×2
IMMOBILIZER KNEE 22 40 CIR (ORTHOPEDIC SUPPLIES) ×1 IMPLANT
IMMOBILIZER KNEE 22 UNIV (SOFTGOODS) ×2 IMPLANT
KIT TURNOVER KIT B (KITS) ×2 IMPLANT
PAD ARMBOARD 7.5X6 YLW CONV (MISCELLANEOUS) ×4 IMPLANT
PILLOW ABDUCTION MEDIUM (MISCELLANEOUS) ×2 IMPLANT

## 2021-04-08 NOTE — ED Provider Notes (Signed)
[facility] EMERGENCY DEPARTMENT Provider Note   CSN: [id number] Arrival date & time: 04/08/21  1805     History Chief Complaint  Patient presents with   Hip Injury    [name] is a 75 y.o. female.  The history is provided by the patient and medical records. No language interpreter was used.   75 year old female with history of recurrent hip dislocation, right prosthetic hip, osteoporosis, COPD, on home oxygen at 2 L brought here via EMS for right hip pain.  Patient report around 33 PM today she was reaching for something that fell down from her bed and she felt a pop follows with severe pain to her right hip.  She believes she may have dislocated her hip.  Since then she is unable to move the hip.  Pain is moderate in severity, persistent, worse with movement.  She denies any other injury she did not fall she denies any numbness.  She is requesting for pain medication.  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   Depression 06/08/2007   Essential hypertension, benign 06/08/2007   Family history of breast cancer    GERD 01/18/2009   Osteoporosis 12/31/2012   Left femur had T score -2.5 on DEXA scan 02/26/2009.    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   Vitamin D deficiency     Patient Active Problem List   Diagnosis Date Noted   Genetic testing 09/04/2020   Family history of breast cancer    Acute on chronic respiratory failure with hypoxia ([place]) 08/28/2019   COPD exacerbation ([place]) 08/27/2019   CAP (community acquired pneumonia) 08/06/2019   Acute respiratory failure with hypoxia ([place])    Shock ([place])    Central venous catheter in place    AKI (acute  kidney injury) ([place])    Prolonged QT interval    Pulmonary hypertension ([place])    Hyponatremia    Hypoglycemia    Encephalopathy acute    Severe sepsis ([place]) 05/31/2019   Low bone mass 05/24/2018   IBS (irritable bowel syndrome) 03/01/2018   Diarrhea 12/22/2017   Falls 12/26/2015   Mixed incontinence urge and stress 12/26/2015   Hypokalemia 10/03/2015   Encounter for central line placement 09/12/2014   Dyslipidemia 12/31/2012   Allergic rhinitis 12/31/2012   Osteoarthritis, hip, bilateral 11/01/2010   Unintentional weight loss 03/19/2009   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     Family History  Problem Relation Age of Onset   Heart disease Mother    Breast cancer Mother 63   Stroke Father    Heart disease Sister    Asthma Daughter    Cancer Other    Diabetes Other    Hypertension Other    COPD Brother        "9/11 survivor"  COPD Sister        "9/11 survivor"   Breast cancer Maternal Grandmother        dx in her 81s   Breast cancer Other        MGMs mother died in her 38s   Colon cancer Neg Hx     Social History   Tobacco Use   Smoking status: Former    Packs/day: 0.25    Years: 30.00    Pack years: 7.50    Types: Cigarettes    Quit date: 05/11/2010    Years since quitting: 10.9   Smokeless tobacco: Never  Vaping Use   Vaping Use: Never used  Substance Use Topics   Alcohol use: Yes    Alcohol/week: 0.0 standard drinks    Comment: wine cooler.   Drug use: No    Home Medications Prior to Admission medications   Medication Sig Start Date End Date Taking? Authorizing Provider  acetaminophen (TYLENOL) 500 MG tablet Take 1,000 mg by mouth every 6 (six) hours as  needed for moderate pain.    [provider]  albuterol (PROVENTIL) (2.5 MG/3ML) 0.083% nebulizer solution 3ML EVERY 6 HOURS AS NEEDED FOR WHEEZING 03/12/21   Isaac Bliss, Rayford Halsted, MD  albuterol (VENTOLIN HFA) 108 (90 Base) MCG/ACT inhaler INHALE TWO PUFFS EVERY 6 HOURS AS NEEDED FOR WHEEZING 03/12/21   Isaac Bliss, Rayford Halsted, MD  amLODipine (NORVASC) 5 MG tablet Take 1 tablet (5 mg total) by mouth daily. 11/07/20 11/07/21  Isaac Bliss, Rayford Halsted, MD  famotidine (PEPCID) 20 MG tablet TAKE 1 TABLET BY MOUTH TWICE A DAY 01/08/21   Isaac Bliss, Rayford Halsted, MD  furosemide (LASIX) 20 MG tablet Take 1 tablet (20 mg total) by mouth daily. 11/07/20   Isaac Bliss, Rayford Halsted, MD  gabapentin (NEURONTIN) 100 MG capsule NEW PRESCRIPTION REQUEST: TAKE ONE TABLET BY MOUTH THREE TIMES DAILY 11/20/20   Isaac Bliss, Rayford Halsted, MD  hydrochlorothiazide (HYDRODIURIL) 25 MG tablet NEW PRESCRIPTION REQUEST: TAKE 1/2 TABLET TO ONE TABLET BY MOUTH EVERY DAY AS NEEDED FOR swelling 11/19/20   Isaac Bliss, Rayford Halsted, MD  ipratropium-albuterol (DUONEB) 0.5-2.5 (3) MG/3ML SOLN USE 3 ML VIA NEBULIZER EVERY 4 HOURS AS NEEDED 06/18/18   Olalere, Cicero Duck A, MD  loratadine (CLARITIN) 10 MG tablet Take 1 tablet (10 mg total) by mouth daily. 11/07/20   Isaac Bliss, Rayford Halsted, MD  Magnesium 500 MG TABS Take 1 tablet (500 mg total) by mouth at bedtime. 11/07/20   Isaac Bliss, Rayford Halsted, MD  melatonin 5 MG TABS Take 2 tablets (10 mg total) by mouth at bedtime. 02/15/21   Isaac Bliss, Rayford Halsted, MD  metoprolol succinate (TOPROL-XL) 50 MG 24 hr tablet TAKE 1 TABLET BY MOUTH EVERY DAY TAKE WITH OR IMMEDIATELY FOLLOWING A MEAL. 11/07/20   Isaac Bliss, Rayford Halsted, MD  mirtazapine (REMERON) 15 MG tablet NEW PRESCRIPTION REQUEST: TAKE ONE TABLET BY MOUTH AT BEDTIME 11/20/20   Isaac Bliss, Rayford Halsted, MD  pantoprazole (PROTONIX) 40 MG tablet NEW PRESCRIPTION REQUEST: TAKE ONE TABLET BY MOUTH EVERY DAY 11/19/20    Isaac Bliss, Rayford Halsted, MD  potassium citrate (UROCIT-K) 10 MEQ (1080 MG) SR tablet Take 1 tablet (10 mEq total) by mouth daily. 11/07/20   Isaac Bliss, Rayford Halsted, MD  pravastatin (PRAVACHOL) 40 MG tablet TAKE 1 TABLET BY MOUTH EVERY DAY IN THE EVENING 11/07/20   Isaac Bliss, Rayford Halsted, MD  Vitamin D, Ergocalciferol, (DRISDOL) 1.25 MG (50000 UNIT) CAPS capsule NEW  PRESCRIPTION REQUEST: VITAMIN D2 1.'25MG'$  (50,000 UNIT) - TAKE ONE CAPSULE BY MOUTH ONCE WEEKLY 11/20/20   Isaac Bliss, Rayford Halsted, MD  WIXELA INHUB 250-50 MCG/DOSE AEPB Inhale 1 puff into the lungs 2 (two) times daily. 08/30/20   [provider]    Allergies    Ace inhibitors, Lisinopril, Valsartan, Aspirin, and Penicillins  Review of Systems   Review of Systems  All other systems reviewed and are negative.  Physical Exam Updated Vital Signs BP 100/66   Pulse 94   Temp 98.6 F (37 C) (Oral)   Resp (!) 27   SpO2 100%   Physical Exam Vitals and nursing note reviewed.  Constitutional:      General: She is not in acute distress.    Appearance: She is well-developed.  HENT:     Head: Atraumatic.  Eyes:     Conjunctiva/sclera: Conjunctivae normal.  Cardiovascular:     Rate and Rhythm: Normal rate and regular rhythm.     Pulses: Normal pulses.     Heart sounds: Normal heart sounds.  Pulmonary:     Effort: Pulmonary effort is normal.  Abdominal:     Palpations: Abdomen is soft.     Tenderness: There is no abdominal tenderness.  Musculoskeletal:        General: Tenderness (Right hip: tenderness about the hip with decreased range of motion, right leg is shortened and internally rotated.) present.     Cervical back: Neck supple.  Skin:    Findings: No rash.  Neurological:     Mental Status: She is alert.  Psychiatric:        Mood and Affect: Mood normal.    ED Results / Procedures / Treatments   Labs (all labs ordered are listed, but only abnormal results are displayed) Labs Reviewed  BASIC  METABOLIC PANEL  CBC WITH DIFFERENTIAL/PLATELET    EKG None  Radiology No results found.  Procedures Procedures   Medications Ordered in ED Medications  morphine 4 MG/ML injection 4 mg (has no administration in time range)    ED Course  I have reviewed the triage vital signs and the nursing notes.  Pertinent labs & imaging results that were available during my care of the patient were reviewed by me and considered in my medical decision making (see chart for details).    MDM Rules/Calculators/A&P                           BP 100/66   Pulse 94   Temp 98.6 F (37 C) (Oral)   Resp (!) 27   SpO2 100%   Final Clinical Impression(s) / ED Diagnoses Final diagnoses:  None    Rx / DC Orders ED Discharge Orders     None      6:44 PM Patient with right hip prosthesis who is here with atraumatic right hip pain concerning for hip dislocation after patient try to reach down her back to pick up something earlier today.  Will obtain x-ray of the right hip, basic labs, give pain medication.  Care discussed with Dr. Matilde Sprang who will continue with current management.   Domenic Moras, PA-C 04/08/21 1845    Kommor, Debe Coder, MD 04/09/21 [phone]

## 2021-04-08 NOTE — Discharge Instructions (Signed)
You should wear a knee immobilizer and keep this on your knee at all times on the right side until further notice. Sleep with the abduction pillow between your legs until further notice. Strict posterior hip precautions with no flexing or bending your right knee and no bending over until further notice. You may weight-bear as tolerated but only be up with assistance.

## 2021-04-08 NOTE — ED Triage Notes (Signed)
Pt from home. At 12noon today Pt was trying to get something from under the bed and slid Right leg. Pt heard a pop. Pt tried to take some tylenol with no relief.   Per EMS pt has right leg shortening.   EMS VS  BP 118/71 HR 98 Oxygen 100% on 2L [place] baseline o2

## 2021-04-08 NOTE — Brief Op Note (Signed)
04/08/2021  10:27 PM  PATIENT:  [name]  75 y.o. female  PRE-OPERATIVE DIAGNOSIS:  Dislocation Right Hip  POST-OPERATIVE DIAGNOSIS:  Dislocation Right Hip  PROCEDURE:  Procedure(s): CLOSED REDUCTION HIP (Right)  SURGEON:  Surgeon(s) and Role:    Mcarthur Rossetti, MD - Primary  PHYSICIAN ASSISTANT:  Benita Stabile, PA-C  ANESTHESIA:   IV sedation and MAC  DICTATION: .Other Dictation: Dictation Number [id number]  PLAN OF CARE: Discharge to home after PACU  PATIENT DISPOSITION:  PACU - hemodynamically stable.   Delay start of Pharmacological VTE agent (>24hrs) due to surgical blood loss or risk of bleeding: yes

## 2021-04-08 NOTE — Anesthesia Procedure Notes (Addendum)
Procedure Name: General with mask airway Date/Time: 04/08/2021 10:10 PM Performed by: Rande Brunt, CRNA Pre-anesthesia Checklist: Patient identified, Emergency Drugs available, Suction available, Patient being monitored and Timeout performed Patient Re-evaluated:Patient Re-evaluated prior to induction Oxygen Delivery Method: Circle system utilized Preoxygenation: Pre-oxygenation with 100% oxygen Induction Type: IV induction Ventilation: Mask ventilation without difficulty and Oral airway inserted - appropriate to patient size

## 2021-04-08 NOTE — ED Provider Notes (Signed)
Emergency Medicine Provider Triage Evaluation Note  [name] , a 75 y.o. female  was evaluated in triage.  Pt complains of right hip pain.  Review of Systems  Positive: Hip pain Negative: Numbness, weakness, back pain  Physical Exam  There were no vitals taken for this visit. Gen:   Awake, no distress   Resp:  Normal effort  MSK:   Moves extremities without difficulty  Other:  TTP R hip, leg is shorten in internally rotated  Medical Decision Making  Medically screening exam initiated at 6:26 PM.  Appropriate orders placed.  [name] was informed that the remainder of the evaluation will be completed by another provider, this initial triage assessment does not replace that evaluation, and the importance of remaining in the ED until their evaluation is complete.  Pt with R prosthetic hip who was trying to pick up something that fell under her bed and felt a pop follows with extreme pain to R hip.  EMS brought pt here.     Domenic Moras, PA-C 04/08/21 1827    Kommor, Debe Coder, MD 04/09/21 [phone]

## 2021-04-08 NOTE — Transfer of Care (Signed)
Immediate Anesthesia Transfer of Care Note  Patient: [name]  Procedure(s) Performed: CLOSED REDUCTION HIP (Right: Hip)  Patient Location: PACU  Anesthesia Type:General  Level of Consciousness: awake, alert , oriented and drowsy  Airway & Oxygen Therapy: Patient Spontanous Breathing and Patient connected to nasal cannula oxygen  Post-op Assessment: Report given to RN, Post -op Vital signs reviewed and stable and Patient moving all extremities X 4  Post vital signs: Reviewed and stable  Last Vitals:  Vitals Value Taken Time  BP 129/84 04/08/21 2233  Temp    Pulse 85 04/08/21 2234  Resp 17 04/08/21 2235  SpO2 97 % 04/08/21 2234  Vitals shown include unvalidated device data.  Last Pain:  Vitals:   04/08/21 2057  TempSrc: Oral  PainSc: 10-Worst pain ever         Complications: No notable events documented.

## 2021-04-08 NOTE — H&P (Signed)
[name] is an 75 y.o. female.   Chief Complaint:   Right hip pain with known prosthetic hip dislocation HPI: The patient is a 75 year old female with a history of a right hip replacement done many years by an orthopedic surgeon in town here who has since retired.  She has had a history of recurrent dislocations of that hip.  It last dislocated many years ago.  Today she was trying to get something out from under the bed and was bending over 4 and she felt and heard a pop with her right hip and then had inability to ambulate.  She was brought by EMS to the [facility] emergency room.  X-rays confirmed a prosthetic right hip dislocation.  The ER staff tried to reduce this under propofol sedation but were unsuccessful and could not get her sedated enough.  She does have a history of sarcoidosis.  The daughter is at the bedside.  We have recommended a close reduction under anesthesia in the operating room under a more controlled environment.  There currently no other acute medical issues.  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   Depression 06/08/2007   Essential hypertension, benign 06/08/2007   Family history of breast cancer    GERD 01/18/2009   Osteoporosis 12/31/2012   Left femur had T score -2.5 on DEXA scan 02/26/2009.    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   Vitamin D deficiency     Past Surgical History:  Procedure Laterality Date   CATARACT EXTRACTION Right    COLONOSCOPY  2002   HIP ARTHROPLASTY Right    TUBAL LIGATION      Family History  Problem Relation Age of Onset   Heart disease Mother    Breast cancer Mother 69   Stroke Father    Heart disease Sister    Asthma  Daughter    Cancer Other    Diabetes Other    Hypertension Other    COPD Brother        "9/11 survivor"   COPD Sister        "9/11 survivor"   Breast cancer Maternal Grandmother        dx in her 69s   Breast cancer Other        MGMs mother died in her 73s   Colon cancer Neg Hx    Social History:  reports that she quit smoking about 10 years ago. She has a 7.50 pack-year smoking history. She has never used smokeless tobacco. She reports current alcohol use. She reports that she does not use drugs.  Allergies:  Allergies  Allergen Reactions   Ace Inhibitors Swelling   Lisinopril Swelling    REACTION: Angioedema   Valsartan Swelling    REACTION: angioedema.  Pt should not get ACEI or ARBs of any kind due to angioedema.   Aspirin Other (See Comments)    REACTION: GI ulcer.   Penicillins     Childhood allergy Has patient had a PCN reaction causing immediate rash, facial/tongue/throat swelling, SOB or lightheadedness with hypotension: Unknown Has patient had a PCN reaction causing severe rash involving mucus membranes or skin necrosis:  Unknown Has patient had a PCN reaction that required hospitalization: Unknown Has patient had a PCN reaction occurring within the last 10 years: Unknown If all of the above answers are "NO", then may proceed with Cephalosporin use.     (Not in a hospital admission)   Results for orders placed or performed during the hospital encounter of 04/08/21 (from the past 48 hour(s))  Basic metabolic panel     Status: Abnormal   Collection Time: 04/08/21  8:30 PM  Result Value Ref Range   Sodium 131 (L) 135 - 145 mmol/L   Potassium 3.7 3.5 - 5.1 mmol/L   Chloride 89 (L) 98 - 111 mmol/L   CO2 29 22 - 32 mmol/L   Glucose, Bld 101 (H) 70 - 99 mg/dL    Comment: Glucose reference range applies only to samples taken after fasting for at least 8 hours.   BUN 10 8 - 23 mg/dL   Creatinine, Ser 1.12 (H) 0.44 - 1.00 mg/dL   Calcium 9.1 8.9 - 10.3 mg/dL   GFR,  Estimated 52 (L) >60 mL/min    Comment: (NOTE) Calculated using the CKD-EPI Creatinine Equation (2021)    Anion gap 13 5 - 15    Comment: Performed at [facility] [address] [place], [place] [address]  CBC with Differential     Status: Abnormal   Collection Time: 04/08/21  8:30 PM  Result Value Ref Range   WBC 5.0 4.0 - 10.5 K/uL   RBC 4.28 3.87 - 5.11 MIL/uL   Hemoglobin 12.6 12.0 - 15.0 g/dL   HCT 36.6 36.0 - 46.0 %   MCV 85.5 80.0 - 100.0 fL   MCH 29.4 26.0 - 34.0 pg   MCHC 34.4 30.0 - 36.0 g/dL   RDW 16.4 (H) 11.5 - 15.5 %   Platelets 254 150 - 400 K/uL   nRBC 0.0 0.0 - 0.2 %   Neutrophils Relative % 73 %   Neutro Abs 3.6 1.7 - 7.7 K/uL   Lymphocytes Relative 19 %   Lymphs Abs 0.9 0.7 - 4.0 K/uL   Monocytes Relative 7 %   Monocytes Absolute 0.4 0.1 - 1.0 K/uL   Eosinophils Relative 1 %   Eosinophils Absolute 0.0 0.0 - 0.5 K/uL   Basophils Relative 0 %   Basophils Absolute 0.0 0.0 - 0.1 K/uL   Immature Granulocytes 0 %   Abs Immature Granulocytes 0.01 0.00 - 0.07 K/uL    Comment: Performed at [facility] [address], [place], [place] [address]   DG Chest 1 View  Result Date: 04/08/2021 CLINICAL DATA:  Hip dislocation history of sarcoid EXAM: CHEST  1 VIEW COMPARISON:  09/21/2020, CT 09/18/2020, 08/06/2019, 08/27/2019 FINDINGS: Severe chronic lung disease with fibrosis and elevation of the hila. No definite acute superimposed airspace disease. Stable cardiomediastinal silhouette. Numerous calcified mediastinal and hilar nodes. No pneumothorax IMPRESSION: Severe chronic lung disease corresponding to history of sarcoidosis. No definite acute superimposed findings by radiography. Electronically Signed   By: Donavan Foil M.D.   On: 04/08/2021 19:39   DG Hip Unilat W or Wo Pelvis 2-3 Views Right  Result Date: 04/08/2021 CLINICAL DATA:  Dislocation EXAM: DG HIP (WITH OR WITHOUT PELVIS) 2-3V RIGHT COMPARISON:  12/16/2010 FINDINGS: Pubic symphysis and rami  appear intact. Right hip replacement with cranial dislocation of the right femoral component from the acetabular cup. No definitive fracture is seen IMPRESSION: Right hip replacement with cranial dislocation of the right femoral component  Electronically Signed   By: Donavan Foil M.D.   On: 04/08/2021 19:37    X-rays independently reviewed of the pelvis and right hip show a prosthetic hip dislocation.  There is no obvious fracture.  Review of Systems  Blood pressure 108/74, pulse 98, temperature 97.9 F (36.6 C), temperature source Oral, resp. rate 20, SpO2 100 %. Physical Exam Vitals reviewed.  Constitutional:      Appearance: She is cachectic.  HENT:     Head: Normocephalic and atraumatic.  Cardiovascular:     Rate and Rhythm: Normal rate.     Pulses: Normal pulses.  Pulmonary:     Effort: Pulmonary effort is normal.  Abdominal:     Palpations: Abdomen is soft.  Musculoskeletal:     Cervical back: Normal range of motion.     Right hip: Deformity and tenderness present. Decreased range of motion. Decreased strength.  Neurological:     General: No focal deficit present.     Mental Status: She is alert.  Psychiatric:        Behavior: Behavior normal.     Assessment/Plan Right posterior prosthetic hip dislocation  Our plan is to proceed to surgery today for a close reduction under anesthesia and fluoroscopic guidance of the right hip dislocation.  The risks and benefits of this were described to the patient and her daughter who agreed with [place] proceeding with surgery given the failed reduction attempt that was performed in the emergency room.  Mcarthur Rossetti, MD 04/08/2021, 9:31 PM

## 2021-04-08 NOTE — Anesthesia Preprocedure Evaluation (Signed)
Anesthesia Evaluation  Patient identified by MRN, date of birth, ID band Patient awake    Reviewed: Allergy & Precautions, H&P , NPO status , Patient's Chart, lab work & pertinent test results  Airway Mallampati: II   Neck ROM: full    Dental   Pulmonary COPD, former smoker,    breath sounds clear to auscultation       Cardiovascular hypertension,  Rhythm:regular Rate:Normal     Neuro/Psych PSYCHIATRIC DISORDERS Depression    GI/Hepatic GERD  ,  Endo/Other    Renal/GU      Musculoskeletal  (+) Arthritis ,   Abdominal   Peds  Hematology   Anesthesia Other Findings   Reproductive/Obstetrics                             Anesthesia Physical Anesthesia Plan  ASA: 2  Anesthesia Plan: General   Post-op Pain Management:    Induction: Intravenous  PONV Risk Score and Plan: 3 and Ondansetron, Dexamethasone and Treatment may vary due to age or medical condition  Airway Management Planned: Mask  Additional Equipment:   Intra-op Plan:   Post-operative Plan:   Informed Consent: I have reviewed the patients History and Physical, chart, labs and discussed the procedure including the risks, benefits and alternatives for the proposed anesthesia with the patient or authorized representative who has indicated his/her understanding and acceptance.     Dental advisory given  Plan Discussed with: CRNA, Anesthesiologist and Surgeon  Anesthesia Plan Comments:         Anesthesia Quick Evaluation

## 2021-04-09 ENCOUNTER — Encounter (HOSPITAL_COMMUNITY): Payer: Self-pay | Admitting: Orthopaedic Surgery

## 2021-04-09 NOTE — ED Provider Notes (Signed)
.  Ortho Injury Treatment  Date/Time: 04/09/2021 12:22 AM Performed by: Teressa Lower, MD Authorized by: Teressa Lower, MD   Consent:    Consent obtained:  Written   Consent given by:  Patient   Risks discussed:  Fracture, nerve damage, restricted joint movement, vascular damage, irreducible dislocation and recurrent dislocation   Alternatives discussed:  Immobilization, no treatment and alternative treatmentInjury location: hip Location details: right hip Injury type: dislocation Dislocation type: anterior Spontaneous dislocation: no Prosthesis: yes  Anesthesia: Local anesthesia used: no  Patient sedated: Yes. Refer to sedation procedure documentation for details of sedation. Manipulation performed: yes Reduction method: abduction, extension and external rotation Reduction successful: no Post-procedure distal perfusion: normal Post-procedure neurological function: normal Post-procedure range of motion: unchanged   .Sedation  Date/Time: 04/09/2021 12:23 AM Performed by: Teressa Lower, MD Authorized by: Teressa Lower, MD   Consent:    Consent obtained:  Written   Consent given by:  Patient   Risks discussed:  Prolonged hypoxia resulting in organ damage, inadequate sedation and respiratory compromise necessitating ventilatory assistance and intubation   Alternatives discussed:  Regional anesthesia Universal protocol:    Immediately prior to procedure, a time out was called: yes   Pre-sedation assessment:    Time since last food or drink:  0800   ASA classification: class 3 - patient with severe systemic disease     Mallampati score:  I - soft palate, uvula, fauces, pillars visible   Pre-sedation assessments completed and reviewed: airway patency, mental status and respiratory function   Procedure details (see MAR for exact dosages):    Preoxygenation:  Nonrebreather mask   Sedation:  Propofol   Intended level of sedation: moderate (conscious sedation)   Total  Provider sedation time (minutes):  30 Post-procedure details:    Procedure completion:  Tolerated well, no immediate complications    Teressa Lower, MD 04/09/21 [id number]

## 2021-04-09 NOTE — Op Note (Signed)
[name], [name] MEDICAL RECORD NO: [id number] ACCOUNT NO: [id number] DATE OF BIRTH: Mar 04, 1946 FACILITY: [facility] LOCATION: [facility] PHYSICIAN: Lind Guest. Ninfa Linden, MD  Operative Report   DATE OF PROCEDURE: 04/08/2021  PREOPERATIVE DIAGNOSIS:  Right prosthetic hip posterior dislocation.  POSTOPERATIVE DIAGNOSIS:  Right prosthetic hip posterior dislocation.  PROCEDURE:  Closed reduction of right hip dislocation under anesthesia.  SURGEON:  Jean Rosenthal, M.D.  ASSISTANT:  Erskine Emery, PA-C.  ANESTHESIA:  General.  COMPLICATIONS: None.  INDICATIONS:  The patient is a 75 year old female who had a right posterior hip dislocation done many years ago by someone in town here who has since retired.  She apparently did have several dislocations, but not any over the last 5 or more years.  She  was reaching under her bed to get something earlier today and she felt a pop in her hip and had the inability to ambulate.  She was brought to the [facility] emergency room via EMS and x-rays showed a posterior hip dislocation.  The ER staff tried  sedation appropriately to get the hip reduced, but were unsuccessful.  I talked to the patient and her daughter and we recommended performing this in the operating room under some deeper sedation.  The risks and benefits were described in detail and they  do wish for [place] to reduce her hip given her inability to ambulate and the pain she is having.  DESCRIPTION OF PROCEDURE:  After informed consent was obtained, appropriate right hip was marked.  She was brought to the operating room and kept on her stretcher.  Her airway was secured and they gave her mask ventilation and sedation.  While protecting  her airway, a timeout was called.  She was identified as the correct patient, correct right hip.  We then performed a reduction maneuver and were able to reduce her hip.  There was an audible pop and leg lengths were backed out to equal and I put her   through range of motion of the right hip and it was stable.  We placed her in a knee immobilizer and did take intraoperative fluoroscopic pictures to show that the hip was reduced.  She was awakened and taken to recovery room in stable condition.   Postoperatively, we will let her weightbear as tolerated with strict posterior hip precautions and staying in the knee immobilizer until further notice.  She will be given an abduction pillow to sleep with between her legs and we will see her back in  followup in the office.   MUK D: 04/08/2021 10:23:53 pm T: 04/09/2021 12:47:00 am  JOB: [id number]

## 2021-04-11 NOTE — Anesthesia Postprocedure Evaluation (Signed)
Anesthesia Post Note  Patient: [name]  Procedure(s) Performed: CLOSED REDUCTION HIP (Right: Hip)     Patient location during evaluation: PACU Anesthesia Type: General Level of consciousness: awake and alert Pain management: pain level controlled Vital Signs Assessment: post-procedure vital signs reviewed and stable Respiratory status: spontaneous breathing, nonlabored ventilation, respiratory function stable and patient connected to nasal cannula oxygen Cardiovascular status: blood pressure returned to baseline and stable Postop Assessment: no apparent nausea or vomiting Anesthetic complications: no   No notable events documented.  Last Vitals:  Vitals:   04/08/21 2320 04/08/21 2335  BP: 116/70 116/69  Pulse: 88 84  Resp: 14 16  Temp:  36.5 C  SpO2: 99% 100%    Last Pain:  Vitals:   04/08/21 2335  TempSrc:   PainSc: 0-No pain                 Shaynah Hund S

## 2021-04-12 ENCOUNTER — Other Ambulatory Visit: Payer: Self-pay | Admitting: Internal Medicine

## 2021-04-12 DIAGNOSIS — I10 Essential (primary) hypertension: Secondary | ICD-10-CM

## 2021-04-17 ENCOUNTER — Ambulatory Visit: Payer: Medicare Other | Admitting: Pulmonary Disease

## 2021-04-17 ENCOUNTER — Other Ambulatory Visit: Payer: Self-pay

## 2021-04-17 ENCOUNTER — Encounter: Payer: Self-pay | Admitting: Pulmonary Disease

## 2021-04-17 VITALS — BP 118/60 | HR 89 | Temp 98.1°F | Ht 63.0 in | Wt 105.0 lb

## 2021-04-17 DIAGNOSIS — R0602 Shortness of breath: Secondary | ICD-10-CM | POA: Diagnosis not present

## 2021-04-17 DIAGNOSIS — J431 Panlobular emphysema: Secondary | ICD-10-CM

## 2021-04-17 DIAGNOSIS — D869 Sarcoidosis, unspecified: Secondary | ICD-10-CM

## 2021-04-17 NOTE — Progress Notes (Signed)
[name]    [id number]    08/29/1945  Primary Care Physician:Hernandez Everardo Beals, MD  Referring Physician: Isaac Bliss, Rayford Halsted, MD [place],  [place] [id number]  Chief complaint:  Patient with a history of chronic obstructive pulmonary disease, chronic shortness of breath History of sarcoidosis In for follow-up  HPI:   Recent hospitalization for COPD exacerbation Breathing has been relatively stable  She does have more difficulty with the heat and humidity  Uses Wixela Nebulization use as needed Albuterol use as needed  Does have a history of COPD, history of sarcoidosis with scarring  She does have occasional cough, no significant sputum production at present Limited with activities   COPD diagnosis about 2003 History of sarcoidosis about the same time-did use a chronic course of steroids back then  Has recently lost some weight, poor appetite, this was associated with the time that she recently lost her husband about 3 years ago  She did have some swelling of the legs when she was recently in the hospital It was recommended that she follow-up with cardiology as well  Occupation: No pertinent history Smoking history: Reformed smoker   Outpatient Encounter Medications as of 04/17/2021  Medication Sig   acetaminophen (TYLENOL) 500 MG tablet Take 1,000 mg by mouth every 6 (six) hours as needed for moderate pain.   albuterol (PROVENTIL) (2.5 MG/3ML) 0.083% nebulizer solution 3ML EVERY 6 HOURS AS NEEDED FOR WHEEZING   albuterol (VENTOLIN HFA) 108 (90 Base) MCG/ACT inhaler INHALE TWO PUFFS EVERY 6 HOURS AS NEEDED FOR WHEEZING   amLODipine (NORVASC) 5 MG tablet Take 1 tablet (5 mg total) by mouth daily.   famotidine (PEPCID) 20 MG tablet TAKE ONE TABLET BY MOUTH TWICE DAILY   furosemide (LASIX) 20 MG tablet Take 1 tablet (20 mg total) by mouth daily.   gabapentin (NEURONTIN) 100 MG capsule NEW PRESCRIPTION REQUEST: TAKE  ONE TABLET BY MOUTH THREE TIMES DAILY   hydrochlorothiazide (HYDRODIURIL) 25 MG tablet NEW PRESCRIPTION REQUEST: TAKE 1/2 TABLET TO ONE TABLET BY MOUTH EVERY DAY AS NEEDED FOR swelling   HYDROcodone-acetaminophen (NORCO/VICODIN) 5-325 MG tablet Take 1 tablet by mouth every 6 (six) hours as needed for moderate pain.   ipratropium-albuterol (DUONEB) 0.5-2.5 (3) MG/3ML SOLN USE 3 ML VIA NEBULIZER EVERY 4 HOURS AS NEEDED   loratadine (CLARITIN) 10 MG tablet Take 1 tablet (10 mg total) by mouth daily.   Magnesium 500 MG TABS Take 1 tablet (500 mg total) by mouth at bedtime.   melatonin 5 MG TABS Take 2 tablets (10 mg total) by mouth at bedtime.   metoprolol succinate (TOPROL-XL) 50 MG 24 hr tablet TAKE 1 TABLET BY MOUTH EVERY DAY TAKE WITH OR IMMEDIATELY FOLLOWING A MEAL.   mirtazapine (REMERON) 15 MG tablet NEW PRESCRIPTION REQUEST: TAKE ONE TABLET BY MOUTH AT BEDTIME   pantoprazole (PROTONIX) 40 MG tablet NEW PRESCRIPTION REQUEST: TAKE ONE TABLET BY MOUTH EVERY DAY   potassium citrate (UROCIT-K) 10 MEQ (1080 MG) SR tablet Take 1 tablet (10 mEq total) by mouth daily.   pravastatin (PRAVACHOL) 40 MG tablet TAKE 1 TABLET BY MOUTH EVERY DAY IN THE EVENING   Vitamin D, Ergocalciferol, (DRISDOL) 1.25 MG (50000 UNIT) CAPS capsule NEW PRESCRIPTION REQUEST: VITAMIN D2 1.'25MG'$  (50,000 UNIT) - TAKE ONE CAPSULE BY MOUTH ONCE WEEKLY   WIXELA INHUB 250-50 MCG/DOSE AEPB Inhale 1 puff into the lungs 2 (two) times daily.   No facility-administered encounter medications on  file as of 04/17/2021.    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   Depression 06/08/2007   Essential hypertension, benign 06/08/2007   Family history of breast cancer    GERD 01/18/2009   Osteoporosis 12/31/2012   Left femur had T score -2.5 on DEXA scan 02/26/2009.    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   Vitamin D deficiency     Past Surgical History:  Procedure Laterality Date   CATARACT EXTRACTION Right    COLONOSCOPY  2002   HIP ARTHROPLASTY Right    HIP CLOSED REDUCTION Right 04/08/2021   Procedure: CLOSED REDUCTION HIP;  Surgeon: Mcarthur Rossetti, MD;  Location: [facility];  Service: Orthopedics;  Laterality: Right;   TUBAL LIGATION      Family History  Problem Relation Age of Onset   Heart disease Mother    Breast cancer Mother 60   Stroke Father    Heart disease Sister    Asthma Daughter    Cancer Other    Diabetes Other    Hypertension Other    COPD Brother        "9/11 survivor"   COPD Sister        "9/11 survivor"   Breast cancer Maternal Grandmother        dx in her 24s   Breast cancer Other        MGMs mother died in her 96s   Colon cancer Neg Hx     Social History   Socioeconomic History   Marital status: Widowed    Spouse name: Not on file   Number of children: Not on file   Years of education: Not on file   Highest education level: Not on file  Occupational History   Not on file  Tobacco Use   Smoking status: Former    Packs/day: 0.25    Years: 30.00    Pack years: 7.50    Types: Cigarettes    Quit date: 05/11/2010    Years since quitting: 10.9   Smokeless tobacco: Never  Vaping Use   Vaping Use: Never used  Substance and Sexual Activity   Alcohol use: Yes    Alcohol/week: 0.0 standard drinks    Comment: wine cooler.   Drug use: No   Sexual activity: Not on file  Other Topics Concern   Not on file  Social History Narrative   Married with 4  children. Retired, previously worked as a Scientist, clinical (histocompatibility and immunogenetics).   Social Determinants of Health   Financial Resource Strain: Not on file  Food Insecurity: Not on file  Transportation Needs: Not on file  Physical Activity: Not on file  Stress: Not on file  Social Connections: Not on file  Intimate Partner Violence: Not on file    Review of Systems  Constitutional:  Positive for appetite change and unexpected weight change.  HENT: Negative.    Eyes:  Negative.   Respiratory:  Positive for cough and shortness of breath.   Cardiovascular:  Positive for leg swelling.  Gastrointestinal: Negative.   Endocrine: Negative.   Genitourinary: Negative.   Musculoskeletal: Negative.    Vitals:   04/17/21 1420  BP: 118/60  Pulse: 89  Temp: 98.1 F (36.7 C)  SpO2: 90%     Physical Exam Constitutional:      Appearance: She is well-developed. She is not diaphoretic.  HENT:     Head: Normocephalic and atraumatic.     Mouth/Throat:     Mouth: Mucous membranes are moist.  Eyes:     General:        Right eye: No discharge.        Left eye: No discharge.  Neck:     Thyroid: No thyromegaly.     Trachea: No tracheal deviation.  Cardiovascular:     Rate and Rhythm: Normal rate and regular rhythm.     Heart sounds: No murmur heard.   No friction rub.  Pulmonary:     Effort: Pulmonary effort is normal. No respiratory distress.     Breath sounds: Rhonchi present. No wheezing.  Musculoskeletal:     Cervical back: No rigidity or tenderness.  Neurological:     Mental Status: She is alert.     Deep Tendon Reflexes: Reflexes are normal and symmetric.  Psychiatric:        Mood and Affect: Mood normal.    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 -This unfortunate with a progressive  Inhaler technique reviewed today   Plan/Recommendations:  Continue bronchodilator treatments  Activity as tolerated  Continue with oxygen supplementation via portable concentrator  Tentative follow-up in 6 months  I spent 30 minutes dedicated to the care of this patient on the date of this encounter to include previsit review of records, face-to-face time with the patient discussing conditions above, post visit ordering of testing, clinical documentation with electronic health record and communicated necessary findings to members of the patient's care team   Sherrilyn Rist MD [facility] 04/17/2021, 2:32 PM  CC: Isaac Bliss, [place]*

## 2021-04-17 NOTE — Patient Instructions (Signed)
Continue lines of care Continue inhalers as we demonstrated  Call with any concerns  Will see you in 6 months

## 2021-04-22 ENCOUNTER — Encounter (HOSPITAL_COMMUNITY): Payer: Self-pay | Admitting: Orthopaedic Surgery

## 2021-04-24 ENCOUNTER — Encounter: Payer: Medicare Other | Admitting: Orthopaedic Surgery

## 2021-04-25 ENCOUNTER — Encounter: Payer: Self-pay | Admitting: Orthopaedic Surgery

## 2021-04-25 ENCOUNTER — Ambulatory Visit (INDEPENDENT_AMBULATORY_CARE_PROVIDER_SITE_OTHER): Payer: Medicare Other | Admitting: Orthopaedic Surgery

## 2021-04-25 ENCOUNTER — Other Ambulatory Visit: Payer: Self-pay

## 2021-04-25 DIAGNOSIS — M25511 Pain in right shoulder: Secondary | ICD-10-CM

## 2021-04-25 DIAGNOSIS — G8929 Other chronic pain: Secondary | ICD-10-CM | POA: Diagnosis not present

## 2021-04-25 MED ORDER — HYDROCODONE-ACETAMINOPHEN 5-325 MG PO TABS: 1.0000 | ORAL_TABLET | Freq: Three times a day (TID) | ORAL | 0 refills | Status: DC | PRN

## 2021-04-25 NOTE — Progress Notes (Signed)
The patient is a 75 year old female that I saw 2 weeks ago on call after she had a prosthetic hip dislocation of her right hip prosthesis.  She was previously a patient of Dr. Marily Memos who is since retired.  That hip was replaced many years ago.  She had had a remote history of a dislocation and then was essentially having no issues with the hip until that night that we saw her she was bending over to get something from underneath her bed and the hip popped out of place.  She is a very cachectic individual.  We took her to the operating room and performed a closed reduction and assessed this under fluoroscopy.  This was done under general anesthesia.  She has been in a knee immobilizer since then and be compliant with posterior hip precautions.  Her family is with her today.  She does have complaints of chronic right shoulder pain.  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  She has not answered any type of referral for shoulder arthroplasty.  Her right hip is well located.  I took care of the knee immobilizer.  I stressed the importance of posterior hip precautions.  I will see if this becomes a recurrent issue some type of revision may be warranted.  All question concerns were answered and addressed.  I will send in some hydrocodone for pain.  Follow-up is as needed.

## 2021-05-10 ENCOUNTER — Other Ambulatory Visit: Payer: Self-pay | Admitting: Internal Medicine

## 2021-05-21 ENCOUNTER — Ambulatory Visit: Payer: Medicare Other | Admitting: Internal Medicine

## 2021-05-27 ENCOUNTER — Telehealth: Payer: Self-pay

## 2021-05-27 NOTE — Telephone Encounter (Signed)
[employer] called to requesting Rx for portable oxygen machine, documents were faxed and representative was informed PCP is out of the office.

## 2021-05-30 NOTE — Telephone Encounter (Signed)
Terrell with [employer] is calling checking on the status of portable oxygen machine he will refax the form today

## 2021-06-03 NOTE — Telephone Encounter (Signed)
[name] called back to check the status of Rx for a portable oxygen machine

## 2021-06-06 ENCOUNTER — Telehealth: Payer: Self-pay | Admitting: Pulmonary Disease

## 2021-06-06 ENCOUNTER — Ambulatory Visit: Payer: Medicare Other | Admitting: Internal Medicine

## 2021-06-06 NOTE — Telephone Encounter (Signed)
[name] with [facility] will call dr Ander Slade office to get their fax number

## 2021-06-06 NOTE — Telephone Encounter (Signed)
I spoke with [name] and he stated he has faxed orders to Pulmonologist.

## 2021-06-07 NOTE — Telephone Encounter (Signed)
Looked at the faxes up front and in AO's cubby in [place]. Did not see any faxes for patient. Will need to call [employer] on Monday since it is after 5pm.

## 2021-06-11 NOTE — Telephone Encounter (Signed)
Checked B pod folders and up front and nothing on this pt  I called [name] with [employer] and left him detailed msg with our fax number

## 2021-06-15 ENCOUNTER — Other Ambulatory Visit: Payer: Self-pay | Admitting: Internal Medicine

## 2021-06-15 DIAGNOSIS — E785 Hyperlipidemia, unspecified: Secondary | ICD-10-CM

## 2021-06-15 DIAGNOSIS — I10 Essential (primary) hypertension: Secondary | ICD-10-CM

## 2021-06-18 NOTE — Telephone Encounter (Signed)
I still have not got a CMN on this patient

## 2021-06-18 NOTE — Telephone Encounter (Signed)
Called [employer] and spoke with Enid Derry about the CMN. Per Enid Derry, he has received the CMN back on pt.  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 Nothing further needed.

## 2021-06-28 ENCOUNTER — Ambulatory Visit: Payer: Medicare Other | Admitting: Internal Medicine

## 2021-07-09 ENCOUNTER — Encounter: Payer: Self-pay | Admitting: Internal Medicine

## 2021-07-09 ENCOUNTER — Ambulatory Visit: Payer: Medicare Other | Admitting: Internal Medicine

## 2021-07-09 ENCOUNTER — Telehealth (INDEPENDENT_AMBULATORY_CARE_PROVIDER_SITE_OTHER): Payer: Medicare Other | Admitting: Internal Medicine

## 2021-07-09 VITALS — Wt 100.0 lb

## 2021-07-09 DIAGNOSIS — G8929 Other chronic pain: Secondary | ICD-10-CM | POA: Diagnosis not present

## 2021-07-09 DIAGNOSIS — M160 Bilateral primary osteoarthritis of hip: Secondary | ICD-10-CM | POA: Diagnosis not present

## 2021-07-09 DIAGNOSIS — M25511 Pain in right shoulder: Secondary | ICD-10-CM

## 2021-07-09 MED ORDER — HYDROCODONE-ACETAMINOPHEN 5-325 MG PO TABS: 1.0000 | ORAL_TABLET | Freq: Two times a day (BID) | ORAL | 0 refills | Status: DC | PRN

## 2021-07-09 NOTE — Progress Notes (Signed)
Virtual Visit via Video Note  I connected with [name] on 07/09/21 at  3:00 PM EST by a video enabled telemedicine application and verified that I am speaking with the correct person using two identifiers.  Location patient: home Location provider: work office Persons participating in the virtual visit: patient, provider, daughter  I discussed the limitations of evaluation and management by telemedicine and the availability of in person appointments. The patient expressed understanding and agreed to proceed.   HPI: Daughter scheduled this visit to discuss her joint pain.  She was in the hospital in September after a dislocation of a prosthetic hip that required intraoperative reduction under general anesthesia.  She has also been seeing Dr. Ninfa Linden for the purpose of her right shoulder degenerative arthritis.  He had prescribed some hydrocodone for her which seemed to work well and they are requesting a refill.  She also takes Tylenol PM at bedtime.   ROS: Constitutional: Denies fever, chills, diaphoresis, appetite change and fatigue.  HEENT: Denies photophobia, eye pain, redness, hearing loss, ear pain, congestion, sore throat, rhinorrhea, sneezing, mouth sores, trouble swallowing, neck pain, neck stiffness and tinnitus.   Respiratory: Denies SOB, DOE, cough, chest tightness,  and wheezing.   Cardiovascular: Denies chest pain, palpitations and leg swelling.  Gastrointestinal: Denies nausea, vomiting, abdominal pain, diarrhea, constipation, blood in stool and abdominal distention.  Genitourinary: Denies dysuria, urgency, frequency, hematuria, flank pain and difficulty urinating.  Endocrine: Denies: hot or cold intolerance, sweats, changes in hair or nails, polyuria, polydipsia. Musculoskeletal: Denies myalgias. Skin: Denies pallor, rash and wound.  Neurological: Denies dizziness, seizures, syncope, weakness, light-headedness, numbness and headaches.  Hematological: Denies  adenopathy. 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   Depression 06/08/2007   Essential hypertension, benign 06/08/2007   Family history of breast cancer    GERD 01/18/2009   Osteoporosis 12/31/2012   Left femur had T score -2.5 on DEXA scan 02/26/2009.    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   Vitamin D deficiency     Past Surgical History:  Procedure Laterality Date   CATARACT EXTRACTION Right    COLONOSCOPY  2002   HIP ARTHROPLASTY Right    HIP CLOSED REDUCTION Right 04/08/2021   Procedure: CLOSED REDUCTION HIP;  Surgeon: Mcarthur Rossetti, MD;  Location: [facility];  Service: Orthopedics;  Laterality: Right;   TUBAL LIGATION      Family History  Problem Relation Age of Onset   Heart disease Mother    Breast cancer Mother 59   Stroke Father    Heart disease Sister    Asthma Daughter    Cancer Other    Diabetes Other    Hypertension Other    COPD Brother        "9/11 survivor"   COPD Sister        "9/11 survivor"   Breast cancer Maternal Grandmother        dx in her 92s   Breast cancer Other  MGMs mother died in her 67s   Colon cancer Neg Hx     SOCIAL HX:   reports that she quit smoking about 11 years ago. Her smoking use included cigarettes. She has a 7.50 pack-year smoking history. She has never used smokeless tobacco. She reports current alcohol use. She reports that she does not use drugs.   Current Outpatient Medications:    acetaminophen (TYLENOL) 500 MG tablet, Take 1,000 mg by  mouth every 6 (six) hours as needed for moderate pain., Disp: , Rfl:    albuterol (PROVENTIL) (2.5 MG/3ML) 0.083% nebulizer solution, 3ML EVERY 6 HOURS AS NEEDED FOR WHEEZING, Disp: 75 mL, Rfl: 10   albuterol (VENTOLIN HFA) 108 (90 Base) MCG/ACT inhaler, INHALE TWO PUFFS EVERY 6 HOURS AS NEEDED FOR WHEEZING, Disp: 54 g, Rfl: 10   amLODipine (NORVASC) 5 MG tablet, TAKE 1 TABLET BY MOUTH ONCE DAILY, Disp: 30 tablet, Rfl: 1   famotidine (PEPCID) 20 MG tablet, TAKE ONE TABLET BY MOUTH TWICE DAILY, Disp: 90 tablet, Rfl: 1   furosemide (LASIX) 20 MG tablet, Take 1 tablet (20 mg total) by mouth daily., Disp: 90 tablet, Rfl: 1   gabapentin (NEURONTIN) 100 MG capsule, NEW PRESCRIPTION REQUEST: TAKE ONE TABLET BY MOUTH THREE TIMES DAILY, Disp: 270 capsule, Rfl: 3   hydrochlorothiazide (HYDRODIURIL) 25 MG tablet, NEW PRESCRIPTION REQUEST: TAKE 1/2 TABLET TO ONE TABLET BY MOUTH EVERY DAY AS NEEDED FOR swelling, Disp: 135 tablet, Rfl: 1   ipratropium-albuterol (DUONEB) 0.5-2.5 (3) MG/3ML SOLN, USE 3 ML VIA NEBULIZER EVERY 4 HOURS AS NEEDED, Disp: 2970 mL, Rfl: 6   loratadine (CLARITIN) 10 MG tablet, Take 1 tablet (10 mg total) by mouth daily., Disp: 90 tablet, Rfl: 1   Magnesium 500 MG TABS, Take 1 tablet (500 mg total) by mouth at bedtime., Disp: 90 tablet, Rfl: 1   melatonin 5 MG TABS, Take 2 tablets (10 mg total) by mouth at bedtime., Disp: 180 tablet, Rfl: 1   metoprolol succinate (TOPROL-XL) 50 MG 24 hr tablet, TAKE 1 TABLET BY MOUTH EVERY DAY TAKE WITH OR IMMEDIATELY FOLLOWING A MEAL., Disp: 90 tablet, Rfl: 1   mirtazapine (REMERON) 15 MG tablet, NEW PRESCRIPTION REQUEST: TAKE ONE TABLET BY MOUTH AT BEDTIME, Disp: 90 tablet, Rfl: 3   pantoprazole (PROTONIX) 40 MG tablet, TAKE 1 TABLET BY MOUTH ONCE DAILY, Disp: 30 tablet, Rfl: 1   potassium citrate (UROCIT-K) 10 MEQ (1080 MG) SR tablet, Take 1 tablet (10 mEq total) by mouth daily., Disp: 90 tablet, Rfl: 1   pravastatin (PRAVACHOL) 40 MG tablet, TAKE 1  TABLET BY MOUTH EVERY EVENING, Disp: 30 tablet, Rfl: 1   Vitamin D, Ergocalciferol, (DRISDOL) 1.25 MG (50000 UNIT) CAPS capsule, TAKE ONE CAPSULE BY MOUTH EVERY WEEK, Disp: 12 capsule, Rfl: 11   WIXELA INHUB 250-50 MCG/DOSE AEPB, Inhale 1 puff into the lungs 2 (two) times daily., Disp: , Rfl:    HYDROcodone-acetaminophen (NORCO/VICODIN) 5-325 MG tablet, Take 1 tablet by mouth every 12 (twelve) hours as needed for moderate pain., Disp: 60 tablet, Rfl: 0  EXAM:   VITALS per patient if applicable: None reported  GENERAL: alert, oriented, appears well and in no acute distress  HEENT: atraumatic, conjunttiva clear, no obvious abnormalities on inspection of external nose and ears  NECK: normal movements of the head and neck  LUNGS: on inspection no signs of respiratory distress, breathing rate appears normal, no obvious gross increased work of breathing, gasping or wheezing  CV: no obvious cyanosis  MS: moves all  visible extremities without noticeable abnormality  PSYCH/NEURO: pleasant and cooperative, no obvious depression or anxiety, speech and thought processing grossly intact  ASSESSMENT AND PLAN:   Osteoarthritis of both hips, unspecified osteoarthritis type  - Plan: HYDROcodone-acetaminophen (NORCO/VICODIN) 5-325 MG tablet  Chronic right shoulder pain  -PDMP reviewed, no red flags, overdose risk or is 130. -I will give her 60 tablets of hydrocodone 5/325 mg to use every 12 hours as needed for pain.  Okay to use Tylenol PM, we have discussed safe acetaminophen prescribing practices.     I discussed the assessment and treatment plan with the patient. The patient was provided an opportunity to ask questions and all were answered. The patient agreed with the plan and demonstrated an understanding of the instructions.   The patient was advised to call back or seek an in-person evaluation if the symptoms worsen or if the condition fails to improve as anticipated.    Lelon Frohlich, MD  [facility]

## 2021-07-15 ENCOUNTER — Other Ambulatory Visit: Payer: Self-pay | Admitting: Internal Medicine

## 2021-07-15 DIAGNOSIS — I10 Essential (primary) hypertension: Secondary | ICD-10-CM

## 2021-07-15 DIAGNOSIS — I50813 Acute on chronic right heart failure: Secondary | ICD-10-CM

## 2021-07-18 ENCOUNTER — Ambulatory Visit (INDEPENDENT_AMBULATORY_CARE_PROVIDER_SITE_OTHER): Payer: Medicare Other | Admitting: Internal Medicine

## 2021-07-18 ENCOUNTER — Encounter: Payer: Self-pay | Admitting: Internal Medicine

## 2021-07-18 ENCOUNTER — Ambulatory Visit: Payer: Medicare Other | Admitting: Internal Medicine

## 2021-07-18 VITALS — BP 110/58 | HR 91 | Temp 98.3°F | Ht 63.0 in | Wt 104.7 lb

## 2021-07-18 DIAGNOSIS — M25511 Pain in right shoulder: Secondary | ICD-10-CM

## 2021-07-18 DIAGNOSIS — D869 Sarcoidosis, unspecified: Secondary | ICD-10-CM

## 2021-07-18 DIAGNOSIS — K219 Gastro-esophageal reflux disease without esophagitis: Secondary | ICD-10-CM | POA: Diagnosis not present

## 2021-07-18 DIAGNOSIS — E785 Hyperlipidemia, unspecified: Secondary | ICD-10-CM

## 2021-07-18 DIAGNOSIS — G8929 Other chronic pain: Secondary | ICD-10-CM

## 2021-07-18 DIAGNOSIS — J9611 Chronic respiratory failure with hypoxia: Secondary | ICD-10-CM

## 2021-07-18 DIAGNOSIS — Z23 Encounter for immunization: Secondary | ICD-10-CM | POA: Diagnosis not present

## 2021-07-18 DIAGNOSIS — J431 Panlobular emphysema: Secondary | ICD-10-CM

## 2021-07-18 DIAGNOSIS — J9612 Chronic respiratory failure with hypercapnia: Secondary | ICD-10-CM

## 2021-07-18 NOTE — Progress Notes (Signed)
Established Patient Office Visit     This visit occurred during the SARS-CoV-2 public health emergency.  Safety protocols were in place, including screening questions prior to the visit, additional usage of staff PPE, and extensive cleaning of exam room while observing appropriate contact time as indicated for disinfecting solutions.    CC/Reason for Visit: Follow-up chronic conditions  HPI: [name] is a 75 y.o. female who is coming in today for the above mentioned reasons. 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  He was recently hospitalized for a prosthetic hip dislocation that required intraoperative repair under sedation.  She has been dealing with significant right shoulder arthritis that per her orthopedist she is not a surgical candidate.  She is being managed with as needed pain medication.  She is requesting a flu vaccine today.   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   Depression 06/08/2007   Essential hypertension, benign 06/08/2007   Family history of breast cancer    GERD 01/18/2009   Osteoporosis 12/31/2012   Left femur had T score -2.5 on DEXA scan 02/26/2009.    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   Vitamin D deficiency     Past Surgical History:  Procedure Laterality Date   CATARACT EXTRACTION Right    COLONOSCOPY  2002   HIP ARTHROPLASTY Right    HIP CLOSED REDUCTION Right 04/08/2021   Procedure: CLOSED REDUCTION HIP;  Surgeon: Mcarthur Rossetti, MD;  Location: [facility];  Service: Orthopedics;  Laterality: Right;   TUBAL LIGATION       Social History:  reports that she quit smoking about 11 years ago. Her smoking use included cigarettes. She has a 7.50 pack-year smoking history. She has never used smokeless tobacco. She reports current alcohol use. She reports that she does not use drugs.  Allergies: Allergies  Allergen Reactions   Ace Inhibitors Swelling   Lisinopril Swelling    REACTION: Angioedema   Valsartan Swelling    REACTION: angioedema.  Pt should not get ACEI or ARBs of any kind due to angioedema.   Aspirin Other (See Comments)    REACTION: GI ulcer.   Penicillins     Childhood allergy Has patient had a PCN reaction causing immediate rash, facial/tongue/throat swelling, SOB or lightheadedness with hypotension: Unknown Has patient had a PCN reaction causing severe rash involving mucus membranes or skin necrosis: Unknown Has patient had a PCN reaction that required hospitalization: Unknown Has patient had a PCN reaction occurring within the last 10 years: Unknown If all of the above answers are "NO", then may proceed with Cephalosporin use.     Family History:  Family History  Problem Relation Age of Onset   Heart disease Mother    Breast cancer Mother 24   Stroke Father    Heart disease Sister    Asthma Daughter    Cancer Other    Diabetes Other    Hypertension Other    COPD Brother        "9/11 survivor"   COPD Sister        "  9/11 survivor"   Breast cancer Maternal Grandmother        dx in her 61s   Breast cancer Other        MGMs mother died in her 6s   Colon cancer Neg Hx      Current Outpatient Medications:    acetaminophen (TYLENOL) 500 MG tablet, Take 1,000 mg by mouth every 6 (six) hours as needed for moderate pain., Disp: , Rfl:    albuterol (PROVENTIL) (2.5 MG/3ML) 0.083% nebulizer solution, 3ML EVERY 6 HOURS AS NEEDED FOR WHEEZING, Disp: 75 mL, Rfl: 10   albuterol (VENTOLIN HFA) 108 (90 Base) MCG/ACT inhaler, INHALE TWO PUFFS EVERY 6 HOURS AS NEEDED FOR WHEEZING, Disp: 54  g, Rfl: 10   amLODipine (NORVASC) 5 MG tablet, TAKE 1 TABLET BY MOUTH ONCE DAILY, Disp: 30 tablet, Rfl: 1   famotidine (PEPCID) 20 MG tablet, TAKE ONE TABLET BY MOUTH TWICE DAILY, Disp: 60 tablet, Rfl: 5   furosemide (LASIX) 20 MG tablet, TAKE ONE TABLET BY MOUTH EVERY DAY, Disp: 30 tablet, Rfl: 5   gabapentin (NEURONTIN) 100 MG capsule, NEW PRESCRIPTION REQUEST: TAKE ONE TABLET BY MOUTH THREE TIMES DAILY, Disp: 270 capsule, Rfl: 3   hydrochlorothiazide (HYDRODIURIL) 25 MG tablet, NEW PRESCRIPTION REQUEST: TAKE 1/2 TABLET TO ONE TABLET BY MOUTH EVERY DAY AS NEEDED FOR swelling, Disp: 135 tablet, Rfl: 1   HYDROcodone-acetaminophen (NORCO/VICODIN) 5-325 MG tablet, Take 1 tablet by mouth every 12 (twelve) hours as needed for moderate pain., Disp: 60 tablet, Rfl: 0   ipratropium-albuterol (DUONEB) 0.5-2.5 (3) MG/3ML SOLN, USE 3 ML VIA NEBULIZER EVERY 4 HOURS AS NEEDED, Disp: 2970 mL, Rfl: 6   loratadine (CLARITIN) 10 MG tablet, Take 1 tablet (10 mg total) by mouth daily., Disp: 90 tablet, Rfl: 1   Magnesium 500 MG TABS, Take 1 tablet (500 mg total) by mouth at bedtime., Disp: 90 tablet, Rfl: 1   melatonin 5 MG TABS, Take 2 tablets (10 mg total) by mouth at bedtime., Disp: 180 tablet, Rfl: 1   metoprolol succinate (TOPROL-XL) 50 MG 24 hr tablet, TAKE ONE TABLET BY MOUTH EVERY DAY WITH OR IMMEDIATELY FOLLOWING A MEAL, Disp: 30 tablet, Rfl: 5   mirtazapine (REMERON) 15 MG tablet, NEW PRESCRIPTION REQUEST: TAKE ONE TABLET BY MOUTH AT BEDTIME, Disp: 90 tablet, Rfl: 3   pantoprazole (PROTONIX) 40 MG tablet, TAKE 1 TABLET BY MOUTH ONCE DAILY, Disp: 30 tablet, Rfl: 1   potassium citrate (UROCIT-K) 10 MEQ (1080 MG) SR tablet, TAKE ONE TABLET BY MOUTH EVERY DAY, Disp: 30 tablet, Rfl: 5   pravastatin (PRAVACHOL) 40 MG tablet, TAKE 1 TABLET BY MOUTH EVERY EVENING, Disp: 30 tablet, Rfl: 1   Vitamin D, Ergocalciferol, (DRISDOL) 1.25 MG (50000 UNIT) CAPS capsule, TAKE ONE CAPSULE BY MOUTH EVERY WEEK, Disp: 12 capsule,  Rfl: 11   WIXELA INHUB 250-50 MCG/DOSE AEPB, Inhale 1 puff into the lungs 2 (two) times daily., Disp: , Rfl:   Review of Systems:  Constitutional: Denies fever, chills, diaphoresis, appetite change and fatigue.  HEENT: Denies photophobia, eye pain, redness, hearing loss, ear pain, congestion, sore throat, rhinorrhea, sneezing, mouth sores, trouble swallowing, neck pain, neck stiffness and tinnitus.   Respiratory: Denies SOB, DOE, cough, chest tightness,  and wheezing.   Cardiovascular: Denies chest pain, palpitations and leg swelling.  Gastrointestinal: Denies nausea, vomiting, abdominal pain, diarrhea, constipation, blood in stool and abdominal distention.  Genitourinary: Denies dysuria, urgency, frequency, hematuria, flank pain and difficulty urinating.  Endocrine: Denies: hot or cold intolerance, sweats,  changes in hair or nails, polyuria, polydipsia. Musculoskeletal: Positive for myalgias, back pain, joint swelling, arthralgias and gait problem.  Skin: Denies pallor, rash and wound.  Neurological: Denies dizziness, seizures, syncope, weakness, light-headedness, numbness and headaches.  Hematological: Denies adenopathy. Easy bruising, personal or family bleeding history  Psychiatric/Behavioral: Denies suicidal ideation, mood changes, confusion, nervousness, sleep disturbance and agitation    Physical Exam: Vitals:   07/18/21 1503  BP: (!) 110/58  Pulse: 91  Temp: 98.3 F (36.8 C)  TempSrc: Oral  SpO2: (!) 82%  Weight: 104 lb 11.2 oz (47.5 kg)  Height: 5\' 3"  (1.6 m)    Body mass index is 18.55 kg/m.   Constitutional: NAD, calm, comfortable, thin Eyes: PERRL, lids and conjunctivae normal ENMT: Mucous membranes are moist.  Respiratory: clear to auscultation bilaterally, no wheezing, no crackles. Normal respiratory effort. No accessory muscle use.  Cardiovascular: Regular rate and rhythm, no murmurs / rubs / gallops. No extremity edema.  Neurologic: Grossly intact and  nonfocal Psychiatric: Normal judgment and insight. Alert and oriented x 3. Normal mood.    Impression and Plan:  Gastroesophageal reflux disease without esophagitis -Well-controlled on PPI therapy.  Dyslipidemia -Check lipids when she returns for CPE.  Sarcoidosis Panlobular emphysema ([place]) Chronic respiratory failure with hypoxia and hypercapnia (H[facility]) -Followed by pulmonary, she is oxygen dependent.  Chronic right shoulder pain -Followed by Dr. Ninfa Linden, not a surgical candidate.  Need for influenza vaccination -Flu vaccine administered today.  Time spent: 32 minutes reviewing chart, interviewing and examining patient and formulating plan of care.   Patient Instructions  -Nice seeing you today!!  -Flu vaccine today.  -Schedule follow up in 3 months for your physical. Please come in fasting that day.    Lelon Frohlich, MD [facility]

## 2021-07-18 NOTE — Patient Instructions (Signed)
-  Nice seeing you today!!  -Flu vaccine today.  -Schedule follow up in 3 months for your physical. Please come in fasting that day.

## 2021-07-22 ENCOUNTER — Other Ambulatory Visit: Payer: Self-pay | Admitting: Internal Medicine

## 2021-08-08 ENCOUNTER — Other Ambulatory Visit: Payer: Self-pay | Admitting: Internal Medicine

## 2021-08-08 DIAGNOSIS — E785 Hyperlipidemia, unspecified: Secondary | ICD-10-CM

## 2021-08-08 DIAGNOSIS — I10 Essential (primary) hypertension: Secondary | ICD-10-CM

## 2021-08-14 ENCOUNTER — Other Ambulatory Visit: Payer: Self-pay | Admitting: Internal Medicine

## 2021-09-10 ENCOUNTER — Other Ambulatory Visit: Payer: Self-pay | Admitting: Internal Medicine

## 2021-09-10 DIAGNOSIS — I10 Essential (primary) hypertension: Secondary | ICD-10-CM

## 2021-09-10 DIAGNOSIS — E785 Hyperlipidemia, unspecified: Secondary | ICD-10-CM

## 2021-09-15 ENCOUNTER — Encounter (HOSPITAL_COMMUNITY)
Admission: EM | Disposition: A | Discharge status: Home / Self Care | Payer: Self-pay | Source: Home / Self Care | Attending: Emergency Medicine

## 2021-09-15 ENCOUNTER — Emergency Department (HOSPITAL_COMMUNITY): Payer: Medicare Other

## 2021-09-15 ENCOUNTER — Other Ambulatory Visit: Payer: Self-pay

## 2021-09-15 ENCOUNTER — Encounter (HOSPITAL_COMMUNITY): Payer: Self-pay | Admitting: Emergency Medicine

## 2021-09-15 ENCOUNTER — Ambulatory Visit (HOSPITAL_COMMUNITY)
Admission: EM | Admit: 2021-09-15 | Discharge: 2021-09-15 | Disposition: A | Discharge status: Home / Self Care | Payer: Medicare Other | Attending: Emergency Medicine | Admitting: Emergency Medicine

## 2021-09-15 ENCOUNTER — Emergency Department (HOSPITAL_COMMUNITY): Payer: Medicare Other | Admitting: Certified Registered Nurse Anesthetist

## 2021-09-15 DIAGNOSIS — Y792 Prosthetic and other implants, materials and accessory orthopedic devices associated with adverse incidents: Secondary | ICD-10-CM | POA: Insufficient documentation

## 2021-09-15 DIAGNOSIS — Z419 Encounter for procedure for purposes other than remedying health state, unspecified: Secondary | ICD-10-CM

## 2021-09-15 DIAGNOSIS — D869 Sarcoidosis, unspecified: Secondary | ICD-10-CM | POA: Diagnosis not present

## 2021-09-15 DIAGNOSIS — I10 Essential (primary) hypertension: Secondary | ICD-10-CM | POA: Insufficient documentation

## 2021-09-15 DIAGNOSIS — S73004A Unspecified dislocation of right hip, initial encounter: Secondary | ICD-10-CM

## 2021-09-15 DIAGNOSIS — Z20822 Contact with and (suspected) exposure to covid-19: Secondary | ICD-10-CM | POA: Insufficient documentation

## 2021-09-15 DIAGNOSIS — M1612 Unilateral primary osteoarthritis, left hip: Secondary | ICD-10-CM | POA: Insufficient documentation

## 2021-09-15 DIAGNOSIS — K219 Gastro-esophageal reflux disease without esophagitis: Secondary | ICD-10-CM | POA: Insufficient documentation

## 2021-09-15 DIAGNOSIS — Z79899 Other long term (current) drug therapy: Secondary | ICD-10-CM | POA: Diagnosis not present

## 2021-09-15 DIAGNOSIS — M47816 Spondylosis without myelopathy or radiculopathy, lumbar region: Secondary | ICD-10-CM | POA: Insufficient documentation

## 2021-09-15 DIAGNOSIS — J449 Chronic obstructive pulmonary disease, unspecified: Secondary | ICD-10-CM | POA: Diagnosis not present

## 2021-09-15 DIAGNOSIS — T84020A Dislocation of internal right hip prosthesis, initial encounter: Secondary | ICD-10-CM | POA: Diagnosis not present

## 2021-09-15 DIAGNOSIS — Z87891 Personal history of nicotine dependence: Secondary | ICD-10-CM | POA: Insufficient documentation

## 2021-09-15 DIAGNOSIS — F32A Depression, unspecified: Secondary | ICD-10-CM | POA: Insufficient documentation

## 2021-09-15 HISTORY — PX: HIP CLOSED REDUCTION: SHX983

## 2021-09-15 LAB — POC SARS CORONAVIRUS 2 AG -  ED: SARSCOV2ONAVIRUS 2 AG: NEGATIVE

## 2021-09-15 SURGERY — CLOSED REDUCTION, HIP
Anesthesia: General | Site: Hip | Laterality: Right

## 2021-09-15 MED ORDER — LIDOCAINE 2% (20 MG/ML) 5 ML SYRINGE
INTRAMUSCULAR | Status: DC | PRN
  Administered 2021-09-15: 40 mg via INTRAVENOUS

## 2021-09-15 MED ORDER — PROPOFOL 10 MG/ML IV BOLUS
100.0000 mg | Freq: Once | INTRAVENOUS | Status: DC
  Filled 2021-09-15: qty 20

## 2021-09-15 MED ORDER — SUCCINYLCHOLINE CHLORIDE 200 MG/10ML IV SOSY
PREFILLED_SYRINGE | INTRAVENOUS | Status: AC
  Filled 2021-09-15: qty 10

## 2021-09-15 MED ORDER — HYDROCODONE-ACETAMINOPHEN 5-325 MG PO TABS
1.0000 | ORAL_TABLET | Freq: Four times a day (QID) | ORAL | 0 refills | Status: DC | PRN
Start: 2021-09-15 — End: 2022-04-14

## 2021-09-15 MED ORDER — PROPOFOL 10 MG/ML IV BOLUS
INTRAVENOUS | Status: AC
  Filled 2021-09-15: qty 20

## 2021-09-15 MED ORDER — PHENYLEPHRINE 40 MCG/ML (10ML) SYRINGE FOR IV PUSH (FOR BLOOD PRESSURE SUPPORT)
PREFILLED_SYRINGE | INTRAVENOUS | Status: DC | PRN
  Administered 2021-09-15: 80 ug via INTRAVENOUS

## 2021-09-15 MED ORDER — FENTANYL CITRATE (PF) 100 MCG/2ML IJ SOLN
INTRAMUSCULAR | Status: DC | PRN
  Administered 2021-09-15: 50 ug via INTRAVENOUS

## 2021-09-15 MED ORDER — LACTATED RINGERS IV SOLN: INTRAVENOUS | Status: DC | PRN

## 2021-09-15 MED ORDER — PROPOFOL 10 MG/ML IV BOLUS
INTRAVENOUS | Status: DC | PRN
  Administered 2021-09-15 (×3): 20 mg via INTRAVENOUS
  Administered 2021-09-15: 80 mg via INTRAVENOUS
  Administered 2021-09-15: 20 mg via INTRAVENOUS

## 2021-09-15 MED ORDER — ONDANSETRON HCL 4 MG/2ML IJ SOLN
INTRAMUSCULAR | Status: AC
  Filled 2021-09-15: qty 2

## 2021-09-15 MED ORDER — SODIUM CHLORIDE 0.9 % IV SOLN
INTRAVENOUS | Status: DC | PRN
  Administered 2021-09-15: 1000 mL via INTRAVENOUS

## 2021-09-15 MED ORDER — ONDANSETRON HCL 4 MG/2ML IJ SOLN
INTRAMUSCULAR | Status: DC | PRN
  Administered 2021-09-15: 4 mg via INTRAVENOUS

## 2021-09-15 MED ORDER — ACETAMINOPHEN 500 MG PO TABS: 1000.0000 mg | ORAL_TABLET | Freq: Once | ORAL | Status: DC | PRN

## 2021-09-15 MED ORDER — SUCCINYLCHOLINE CHLORIDE 200 MG/10ML IV SOSY
PREFILLED_SYRINGE | INTRAVENOUS | Status: DC | PRN
  Administered 2021-09-15: 80 mg via INTRAVENOUS

## 2021-09-15 MED ORDER — FENTANYL CITRATE PF 50 MCG/ML IJ SOSY: 25.0000 ug | PREFILLED_SYRINGE | INTRAMUSCULAR | Status: DC | PRN

## 2021-09-15 MED ORDER — ACETAMINOPHEN 160 MG/5ML PO SOLN: 1000.0000 mg | Freq: Once | ORAL | Status: DC | PRN

## 2021-09-15 MED ORDER — ACETAMINOPHEN 10 MG/ML IV SOLN: 1000.0000 mg | Freq: Once | INTRAVENOUS | Status: DC | PRN

## 2021-09-15 MED ORDER — FENTANYL CITRATE PF 50 MCG/ML IJ SOSY
100.0000 ug | PREFILLED_SYRINGE | Freq: Once | INTRAMUSCULAR | Status: DC
  Filled 2021-09-15: qty 2

## 2021-09-15 MED ORDER — MORPHINE SULFATE (PF) 4 MG/ML IV SOLN
4.0000 mg | Freq: Once | INTRAVENOUS | Status: DC
  Filled 2021-09-15: qty 1

## 2021-09-15 SURGICAL SUPPLY — 2 items
IMMOBILIZER KNEE 20 (SOFTGOODS) ×2 IMPLANT
IMMOBILIZER KNEE 20 THIGH 36 (SOFTGOODS) IMPLANT

## 2021-09-15 NOTE — Op Note (Signed)
OPERATIVE REPORT  DATE OF SURGERY: 09/15/2021  PATIENT NAME:  [name] MRN: [id number] DOB: 04-12-1946  PCP: Isaac Bliss, Rayford Halsted, MD  PRE-OPERATIVE DIAGNOSIS: Right hip dislocation  POST-OPERATIVE DIAGNOSIS: Same  PROCEDURE:   Close reduction right total hip prosthetic dislocation  SURGEON:  Melina Schools, MD  PHYSICIAN ASSISTANT: None  ANESTHESIA:   General  EBL: None   Complications: None  BRIEF HISTORY: [name] is a 76 y.o. female who had a postural indiscretion this morning and noted inability to ambulate and significant right hip pain.  She was brought to the emergency room and x-rays demonstrated a hip dislocation.  Attempts to close reduction failed and so we elected to bring her to the operating room for close reduction under general anesthesia.  PROCEDURE DETAILS: Patient was brought into the operating room and was properly positioned on the operating room table.  After induction with IV sedation, a timeout was taken to confirm all important data: including patient, procedure, and the side.  A gentle closed reduction maneuver was performed and there was an audible clunk indicating satisfactory reduction.  A knee immobilizer was then applied and intraoperative x-rays (AP and crosstable lateral) demonstrate satisfactory reduction of the implant.  Patient was then transferred to the PACU without incident.  The end of the case all needle and sponge counts were correct.  Patient will be discharged from the PACU to home with a knee immobilizer.  She will follow-up with my partner Dr. Lyla Glassing in 7 to 10 days for reevaluation.  A prescription for tramadol was provided for postoperative analgesia.  Melina Schools, MD 09/15/2021 4:28 PM

## 2021-09-15 NOTE — Transfer of Care (Signed)
Immediate Anesthesia Transfer of Care Note  Patient: [name]  Procedure(s) Performed: CLOSED REDUCTION HIP (Right: Hip)  Patient Location: PACU  Anesthesia Type:General  Level of Consciousness: drowsy and patient cooperative  Airway & Oxygen Therapy: Patient Spontanous Breathing and Patient connected to face mask oxygen  Post-op Assessment: Report given to RN and Post -op Vital signs reviewed and stable  Post vital signs: Reviewed and stable  Last Vitals:  Vitals Value Taken Time  BP 130/71 09/15/21 1635  Temp    Pulse 84 09/15/21 1638  Resp 17 09/15/21 1638  SpO2 100 % 09/15/21 1638  Vitals shown include unvalidated device data.  Last Pain:  Vitals:   09/15/21 1320  TempSrc: Oral  PainSc:          Complications: No notable events documented.

## 2021-09-15 NOTE — ED Provider Notes (Signed)
.  Sedation  Date/Time: 09/15/2021 3:26 PM Performed by: Malvin Johns, MD Authorized by: Malvin Johns, MD   Consent:    Consent obtained:  Written   Consent given by:  Patient   Alternatives discussed:  Analgesia without sedation Universal protocol:    Procedure explained and questions answered to patient or proxy's satisfaction: yes     Test results available: yes     Immediately prior to procedure, a time out was called: yes     Patient identity confirmed:  Arm band and verbally with patient Indications:    Procedure performed:  Dislocation reduction   Procedure necessitating sedation performed by:  Different physician Pre-sedation assessment:    Time since last food or drink:  6   ASA classification: class 2 - patient with mild systemic disease     Mouth opening:  2 finger widths   Thyromental distance:  3 finger widths   Mallampati score:  II - soft palate, uvula, fauces visible   Neck mobility: normal     Pre-sedation assessments completed and reviewed: airway patency, cardiovascular function, hydration status, mental status, nausea/vomiting, pain level, respiratory function and temperature     Pre-sedation assessment completed:  09/15/2021 3:00 PM Immediate pre-procedure details:    Reassessment: Patient reassessed immediately prior to procedure     Reviewed: vital signs and NPO status     Verified: bag valve mask available, emergency equipment available, intubation equipment available, IV patency confirmed and oxygen available   Procedure details (see MAR for exact dosages):    Preoxygenation:  Nasal cannula   Sedation:  Propofol   Intended level of sedation: deep   Analgesia:  Fentanyl   Intra-procedure monitoring:  Blood pressure monitoring, cardiac monitor, continuous capnometry, continuous pulse oximetry, frequent LOC assessments and frequent vital sign checks   Intra-procedure events: hypoxia and respiratory depression     Intra-procedure management:  BVM ventilation    Total Provider sedation time (minutes):  10 Post-procedure details:    Post-sedation assessment completed:  09/15/2021 3:28 PM   Recovery: Patient returned to pre-procedure baseline     Post-sedation assessments completed and reviewed: airway patency, cardiovascular function, hydration status, mental status, nausea/vomiting, pain level, respiratory function and temperature     Patient is stable for discharge or admission: yes     Procedure completion:  Tolerated well, no immediate complications Comments:     Patient had a brief episode of respiratory depression with hypoxia requiring a short period of bag-valve-mask ventilation.  She tolerated this well without any prolonged hypoxia.    Malvin Johns, MD 09/15/21 [phone]

## 2021-09-15 NOTE — Anesthesia Preprocedure Evaluation (Signed)
Anesthesia Evaluation  Patient identified by MRN, date of birth, ID band Patient awake  General Assessment Comment:patient s/p sedation in ED. Case discussed with her daughters who consented for procedure  Reviewed: Allergy & Precautions, NPO status , Patient's Chart, lab work & pertinent test results  History of Anesthesia Complications Negative for: history of anesthetic complications  Airway Mallampati: II  TM Distance: >3 FB Neck ROM: Full    Dental  (+) Missing, Chipped, Edentulous Upper, Upper Dentures,    Pulmonary neg shortness of breath, neg sleep apnea, COPD,  COPD inhaler, neg recent URI, former smoker,    breath sounds clear to auscultation       Cardiovascular hypertension, Pt. on medications  Rhythm:Regular     Neuro/Psych PSYCHIATRIC DISORDERS Depression negative neurological ROS     GI/Hepatic Neg liver ROS, GERD  ,  Endo/Other  negative endocrine ROS  Renal/GU Renal diseaseLab Results      Component                Value               Date                      CREATININE               1.12 (H)            04/08/2021                Musculoskeletal  (+) Arthritis , Dislocated right hip   Abdominal   Peds  Hematology Lab Results      Component                Value               Date                      WBC                      5.0                 04/08/2021                HGB                      12.6                04/08/2021                HCT                      36.6                04/08/2021                MCV                      85.5                04/08/2021                PLT                      254                 04/08/2021  Anesthesia Other Findings   Reproductive/Obstetrics                             Anesthesia Physical Anesthesia Plan  ASA: 2  Anesthesia Plan: General   Post-op Pain Management: Minimal or no pain anticipated   Induction:  Intravenous  PONV Risk Score and Plan: 3 and Ondansetron  Airway Management Planned: Mask  Additional Equipment: None  Intra-op Plan:   Post-operative Plan:   Informed Consent:     Consent reviewed with POA  Plan Discussed with: Anesthesiologist and CRNA  Anesthesia Plan Comments:         Anesthesia Quick Evaluation

## 2021-09-15 NOTE — ED Triage Notes (Signed)
Patient BIBA from home d/t possible right hip location. Pt had hip replacement in 2022 and has had recurrent dislocations since then. EMS reports shortening and no rotation. Patient denies falls.

## 2021-09-15 NOTE — ED Provider Notes (Signed)
[facility] DEPT Provider Note   CSN: [id number] Arrival date & time: 09/15/21  1253     History  Chief Complaint  Patient presents with   Hip Pain    [name] is a 76 y.o. female.  76 year old female presents with right hip pain after rolling over in her bed.  Prior history of right hip replacement with hip dislocation approximately 4 months ago.  Patient has not followed up since that time.  Denies any direct trauma.  Was unable to stand after this happened.  EMS called and patient's lower extremity was shortened but not rotated.  Was sent here for management      Home Medications Prior to Admission medications   Medication Sig Start Date End Date Taking? Authorizing Provider  acetaminophen (TYLENOL) 500 MG tablet Take 1,000 mg by mouth every 6 (six) hours as needed for moderate pain.    [provider]  albuterol (PROVENTIL) (2.5 MG/3ML) 0.083% nebulizer solution 3ML EVERY 6 HOURS AS NEEDED FOR WHEEZING 03/12/21   Isaac Bliss, Rayford Halsted, MD  albuterol (VENTOLIN HFA) 108 (90 Base) MCG/ACT inhaler INHALE TWO PUFFS EVERY 6 HOURS AS NEEDED FOR WHEEZING 03/12/21   Isaac Bliss, Rayford Halsted, MD  amLODipine (NORVASC) 5 MG tablet TAKE ONE TABLET BY MOUTH DAILY 09/11/21   Isaac Bliss, Rayford Halsted, MD  famotidine (PEPCID) 20 MG tablet TAKE ONE TABLET BY MOUTH TWICE DAILY 07/16/21   Isaac Bliss, Rayford Halsted, MD  furosemide (LASIX) 20 MG tablet TAKE ONE TABLET BY MOUTH EVERY DAY 07/16/21   Isaac Bliss, Rayford Halsted, MD  gabapentin (NEURONTIN) 100 MG capsule NEW PRESCRIPTION REQUEST: TAKE ONE TABLET BY MOUTH THREE TIMES DAILY 11/20/20   Isaac Bliss, Rayford Halsted, MD  hydrochlorothiazide (HYDRODIURIL) 25 MG tablet TAKE ONE-HALF TO ONE TABLET BY MOUTH EVERY DAY AS NEEDED FOR SWELLING 08/14/21   Isaac Bliss, Rayford Halsted, MD  HYDROcodone-acetaminophen (NORCO/VICODIN) 5-325 MG tablet Take 1 tablet by mouth every 12 (twelve) hours as needed for  moderate pain. 07/09/21 07/09/22  Erline Hau, MD  ipratropium-albuterol (DUONEB) 0.5-2.5 (3) MG/3ML SOLN USE 3 ML VIA NEBULIZER EVERY 4 HOURS AS NEEDED 06/18/18   Sherrilyn Rist A, MD  loratadine (CLARITIN) 10 MG tablet Take 1 tablet (10 mg total) by mouth daily. 11/07/20   Isaac Bliss, Rayford Halsted, MD  Magnesium 500 MG TABS Take 1 tablet (500 mg total) by mouth at bedtime. 11/07/20   Isaac Bliss, Rayford Halsted, MD  melatonin 5 MG TABS TAKE TWO TABLETS BY MOUTH AT BEDTIME 08/09/21   Isaac Bliss, Rayford Halsted, MD  metoprolol succinate (TOPROL-XL) 50 MG 24 hr tablet TAKE ONE TABLET BY MOUTH EVERY DAY WITH OR IMMEDIATELY FOLLOWING A MEAL 07/16/21   Isaac Bliss, Rayford Halsted, MD  mirtazapine (REMERON) 15 MG tablet NEW PRESCRIPTION REQUEST: TAKE ONE TABLET BY MOUTH AT BEDTIME 11/20/20   Isaac Bliss, Rayford Halsted, MD  pantoprazole (PROTONIX) 40 MG tablet TAKE ONE TABLET BY MOUTH DAILY 08/09/21   Isaac Bliss, Rayford Halsted, MD  potassium citrate (UROCIT-K) 10 MEQ (1080 MG) SR tablet TAKE ONE TABLET BY MOUTH EVERY DAY 07/16/21   Isaac Bliss, Rayford Halsted, MD  pravastatin (PRAVACHOL) 40 MG tablet TAKE ONE TABLET BY MOUTH IN THE EVENING 09/11/21   Isaac Bliss, Rayford Halsted, MD  Vitamin D, Ergocalciferol, (DRISDOL) 1.25 MG (50000 UNIT) CAPS capsule TAKE ONE CAPSULE BY MOUTH EVERY WEEK 05/14/21   Isaac Bliss, Rayford Halsted, MD  WIXELA INHUB 250-50 MCG/DOSE AEPB Inhale 1  puff into the lungs 2 (two) times daily. 08/30/20   [provider]      Allergies    Ace inhibitors, Lisinopril, Valsartan, Aspirin, and Penicillins    Review of Systems   Review of Systems  All other systems reviewed and are negative.  Physical Exam Updated Vital Signs BP 128/67 (BP Location: Right Arm)    Pulse 81    Temp 98.5 F (36.9 C) (Oral)    Resp 20    Ht 1.626 m (5\' 4" )    Wt 46.7 kg    SpO2 93%    BMI 17.68 kg/m  Physical Exam Vitals and nursing note reviewed.  Constitutional:      General: She is  not in acute distress.    Appearance: She is underweight. She is not toxic-appearing.  HENT:     Head: Normocephalic and atraumatic.  Eyes:     General: Lids are normal.     Conjunctiva/sclera: Conjunctivae normal.     Pupils: Pupils are equal, round, and reactive to light.  Neck:     Thyroid: No thyroid mass.     Trachea: No tracheal deviation.  Cardiovascular:     Rate and Rhythm: Normal rate and regular rhythm.     Heart sounds: Normal heart sounds. No murmur heard.   No gallop.  Pulmonary:     Effort: Pulmonary effort is normal. No respiratory distress.     Breath sounds: Normal breath sounds. No stridor. No decreased breath sounds, wheezing, rhonchi or rales.  Abdominal:     General: There is no distension.     Palpations: Abdomen is soft.     Tenderness: There is no abdominal tenderness. There is no rebound.  Musculoskeletal:        General: No tenderness. Normal range of motion.     Cervical back: Normal range of motion and neck supple.       Legs:  Skin:    General: Skin is warm and dry.     Findings: No abrasion or rash.  Neurological:     Mental Status: She is alert and oriented to person, place, and time. Mental status is at baseline.     GCS: GCS eye subscore is 4. GCS verbal subscore is 5. GCS motor subscore is 6.     Cranial Nerves: No cranial nerve deficit.     Sensory: No sensory deficit.     Motor: Motor function is intact.  Psychiatric:        Attention and Perception: Attention normal.        Speech: Speech normal.        Behavior: Behavior normal.    ED Results / Procedures / Treatments   Labs (all labs ordered are listed, but only abnormal results are displayed) Labs Reviewed - No data to display  EKG None  Radiology No results found.  Procedures Reduction of dislocation  Date/Time: 09/15/2021 3:12 PM Performed by: Lacretia Leigh, MD Authorized by: Lacretia Leigh, MD  Consent: Written consent obtained. Risks and benefits: risks, benefits  and alternatives were discussed Required items: required blood products, implants, devices, and special equipment available Time out: Immediately prior to procedure a "time out" was called to verify the correct patient, procedure, equipment, support staff and site/side marked as required. Local anesthesia used: no  Anesthesia: Local anesthesia used: no  Sedation: Patient sedated: yes Vitals: Vital signs were monitored during sedation.  Comments: Attempted to reduce hip using traction countertraction which was unsuccessful.  Sedation performed by  different provider      Medications Ordered in ED Medications - No data to display  ED Course/ Medical Decision Making/ A&P                           Medical Decision Making Amount and/or Complexity of Data Reviewed Radiology: ordered.  Risk Prescription drug management.   Patient's x-ray showed a right superior hip dislocation.  Review of old records showed that patient did require an operative intervention last time.  Attempted to reduce hip today unsuccessfully.  Sedation provided by different provider.  Discussed with orthopedic surgeon, Dr. Rolena Infante, he will take patient to the OR for close reduction        Final Clinical Impression(s) / ED Diagnoses Final diagnoses:  None    Rx / DC Orders ED Discharge Orders     None         Lacretia Leigh, MD 09/15/21 1514

## 2021-09-15 NOTE — ED Notes (Signed)
Attempted IV x2 without success  

## 2021-09-15 NOTE — H&P (Signed)
Chief Complaint: Right hip dislocation History: [name] is a 76 y.o. female.   76 year old female presents with right hip pain after rolling over in her bed.  Prior history of right hip replacement with hip dislocation approximately 4 months ago.  Patient has not followed up since that time.  Denies any direct trauma.  Was unable to stand after this happened.  EMS called and patient's lower extremity was shortened but not rotated.  Was sent here for management   Review of system, no loss of consciousness, blurry vision, headaches.  No nausea, vomiting, or recent fevers or chills.  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   Depression 06/08/2007   Essential hypertension, benign 06/08/2007   Family history of breast cancer    GERD 01/18/2009   Osteoporosis 12/31/2012   Left femur had T score -2.5 on DEXA scan 02/26/2009.    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   Vitamin D deficiency     Allergies  Allergen Reactions   Ace Inhibitors Swelling   Lisinopril Swelling    REACTION: Angioedema   Valsartan Swelling    REACTION: angioedema.  Pt should not get ACEI or ARBs of any kind due to angioedema.   Aspirin Other (See Comments)    REACTION: GI ulcer.   Penicillins     Childhood allergy Has patient had a PCN reaction causing immediate rash, facial/tongue/throat swelling, SOB or lightheadedness with hypotension: Unknown Has patient had a PCN reaction causing severe rash involving mucus membranes or skin necrosis: Unknown Has patient had a PCN reaction that required hospitalization: Unknown Has patient had a PCN reaction occurring within the last 10 years: Unknown If all of  the above answers are "NO", then may proceed with Cephalosporin use.     No current facility-administered medications on file prior to encounter.   Current Outpatient Medications on File Prior to Encounter  Medication Sig Dispense Refill   acetaminophen (TYLENOL) 500 MG tablet Take 1,000 mg by mouth every 6 (six) hours as needed for moderate pain.     albuterol (PROVENTIL) (2.5 MG/3ML) 0.083% nebulizer solution 3ML EVERY 6 HOURS AS NEEDED FOR WHEEZING 75 mL 10   albuterol (VENTOLIN HFA) 108 (90 Base) MCG/ACT inhaler INHALE TWO PUFFS EVERY 6 HOURS AS NEEDED FOR WHEEZING 54 g 10   amLODipine (NORVASC) 5 MG tablet TAKE ONE TABLET BY MOUTH DAILY 90 tablet 0   famotidine (PEPCID) 20 MG tablet TAKE ONE TABLET BY MOUTH TWICE DAILY 60 tablet 5   furosemide (LASIX) 20 MG tablet TAKE ONE TABLET BY MOUTH EVERY DAY 30 tablet 5   gabapentin (NEURONTIN) 100 MG capsule NEW PRESCRIPTION REQUEST: TAKE ONE TABLET BY MOUTH THREE TIMES DAILY 270 capsule 3   hydrochlorothiazide (HYDRODIURIL) 25 MG tablet TAKE ONE-HALF TO ONE TABLET BY MOUTH EVERY DAY AS NEEDED FOR SWELLING 30 tablet 0   HYDROcodone-acetaminophen (NORCO/VICODIN) 5-325 MG tablet Take 1 tablet by mouth every 12 (twelve) hours as needed for moderate pain. 60 tablet 0   ipratropium-albuterol (DUONEB) 0.5-2.5 (3) MG/3ML SOLN USE 3 ML VIA NEBULIZER EVERY 4 HOURS AS NEEDED 2970 mL 6   loratadine (CLARITIN)  10 MG tablet Take 1 tablet (10 mg total) by mouth daily. 90 tablet 1   Magnesium 500 MG TABS Take 1 tablet (500 mg total) by mouth at bedtime. 90 tablet 1   melatonin 5 MG TABS TAKE TWO TABLETS BY MOUTH AT BEDTIME 60 tablet 5   metoprolol succinate (TOPROL-XL) 50 MG 24 hr tablet TAKE ONE TABLET BY MOUTH EVERY DAY WITH OR IMMEDIATELY FOLLOWING A MEAL 30 tablet 5   mirtazapine (REMERON) 15 MG tablet NEW PRESCRIPTION REQUEST: TAKE ONE TABLET BY MOUTH AT BEDTIME 90 tablet 3   pantoprazole (PROTONIX) 40 MG tablet TAKE ONE TABLET BY MOUTH DAILY 30 tablet 1    potassium citrate (UROCIT-K) 10 MEQ (1080 MG) SR tablet TAKE ONE TABLET BY MOUTH EVERY DAY 30 tablet 5   pravastatin (PRAVACHOL) 40 MG tablet TAKE ONE TABLET BY MOUTH IN THE EVENING 90 tablet 0   Vitamin D, Ergocalciferol, (DRISDOL) 1.25 MG (50000 UNIT) CAPS capsule TAKE ONE CAPSULE BY MOUTH EVERY WEEK 12 capsule 11   WIXELA INHUB 250-50 MCG/DOSE AEPB Inhale 1 puff into the lungs 2 (two) times daily.      Physical Exam: Vitals:   09/15/21 1515 09/15/21 1530  BP: (!) 108/59 140/86  Pulse: 74 79  Resp: 20   Temp:    SpO2: 100% 100%   Body mass index is 17.68 kg/m. She is alert and oriented x3. No shortness of breath or chest pain. Abdomen soft and nontender.  No rebound tenderness, no incontinence of bowel or bladder Full range of motion the upper extremity with no gross crepitus deformity or pain. Right hip pain with obvious shortening and external rotation of the hip.  Tenderness to palpation with direct palpation over the hip.  No knee or ankle pain with palpation.  Compartments in the lower extremity are soft and nontender.  2+ dorsalis pedis/posterior tibial still pulses bilaterally. EHL/gastrocnemius/tibialis anterior: 5/5 motor strength.  Normal sensation light touch.  Image: DG Hip Unilat W or Wo Pelvis 2-3 Views Right  Result Date: 09/15/2021 CLINICAL DATA:  Status post fall. EXAM: DG HIP (WITH OR WITHOUT PELVIS) 2-3V RIGHT COMPARISON:  None. FINDINGS: Generalized osteopenia. Right hip arthroplasty. Superior dislocation of the right femoral head relative to the acetabular cup. No acute fracture. Mild osteoarthritis of the left hip. Mild osteoarthritis of bilateral SI joints. Lower lumbar spine spondylosis. Calcified pelvic mass likely reflecting a uterine fibroid. IMPRESSION: 1. Superior dislocation of the right femoral head relative to the acetabular cup. Electronically Signed   By: Kathreen Devoid M.D.   On: 09/15/2021 14:17    A/P: [name] is a very pleasant 76 year old man  who had a total knee replacement several years ago and had a dislocation in August 2022.  That was closed reduced in the operating room and she was ultimately discharged.  She returns today after a postural indiscretion caused a second hip dislocation.  Attempts at closed reduction under conscious sedation in the ER were unsuccessful.  As result Ortho consultation was requested.  On exam the patient is neurologically intact with inability to move the hip with an obvious deformity.  Imaging clearly shows a superior right total hip prosthetic dislocation with no apparent fracture.  I have discussed the case with the patient's daughter as well as the patient and we will plan on moving forward with close reduction of the right hip in the operating room.  If we are unsuccessful the plan will be to admit the patient for possible open reduction at  a later date.  Otherwise patient will be discharged later on today with a knee immobilizer and follow-up with my partner Dr. Lyla Glassing.  All appropriate risks, benefits, and alternatives to surgery were discussed with the patient and her daughter and phone consent was obtained by her daughter who has her power of attorney.  The patient also expressed an understanding and willingness to move forward with surgery.

## 2021-09-15 NOTE — Progress Notes (Signed)
Orthopedic Tech Progress Note Patient Details:  [name] [birth year] [id number]  Ortho Devices Type of Ortho Device: Knee Immobilizer Ortho Device/Splint Location: right hip Ortho Device/Splint Interventions: Application   Post Interventions Patient Tolerated: Well Instructions Provided: Care of device  [name] 09/15/2021, 3:03 PM

## 2021-09-15 NOTE — Brief Op Note (Signed)
09/15/2021  4:31 PM  PATIENT:  [name]  76 y.o. female  PRE-OPERATIVE DIAGNOSIS:  DISLOCATED RIGHT HIP  POST-OPERATIVE DIAGNOSIS:  DISLOCATED RIGHT HIP  PROCEDURE:  Procedure(s): CLOSED REDUCTION HIP (Right)  SURGEON:  Surgeon(s) and Role:    Melina Schools, MD - Primary  PHYSICIAN ASSISTANT:   ASSISTANTS: none   ANESTHESIA:   IV sedation  EBL:  none   BLOOD ADMINISTERED:none  DRAINS: none   LOCAL MEDICATIONS USED:  NONE  SPECIMEN:  No Specimen  DISPOSITION OF SPECIMEN:  N/A  COUNTS:  YES  TOURNIQUET:  * No tourniquets in log *  DICTATION: .Dragon Dictation  PLAN OF CARE: Discharge to home after PACU  PATIENT DISPOSITION:  PACU - hemodynamically stable.

## 2021-09-16 ENCOUNTER — Encounter (HOSPITAL_COMMUNITY): Payer: Self-pay | Admitting: Orthopedic Surgery

## 2021-09-17 NOTE — Anesthesia Postprocedure Evaluation (Signed)
Anesthesia Post Note  Patient: [name]  Procedure(s) Performed: CLOSED REDUCTION HIP (Right: Hip)     Patient location during evaluation: PACU Anesthesia Type: General Level of consciousness: awake and alert Pain management: pain level controlled Vital Signs Assessment: post-procedure vital signs reviewed and stable Respiratory status: spontaneous breathing, nonlabored ventilation, respiratory function stable and patient connected to nasal cannula oxygen Cardiovascular status: blood pressure returned to baseline and stable Postop Assessment: no apparent nausea or vomiting Anesthetic complications: no   No notable events documented.  Last Vitals:  Vitals:   09/15/21 1702 09/15/21 1703  BP: (!) 143/88 127/66  Pulse:  86  Resp:    Temp:  36.4 C  SpO2:  100%    Last Pain:  Vitals:   09/15/21 1703  TempSrc:   PainSc: 0-No pain                 Bryanna Yim

## 2021-10-11 ENCOUNTER — Other Ambulatory Visit: Payer: Self-pay | Admitting: Internal Medicine

## 2021-10-15 ENCOUNTER — Other Ambulatory Visit: Payer: Self-pay | Admitting: Internal Medicine

## 2021-10-15 DIAGNOSIS — E785 Hyperlipidemia, unspecified: Secondary | ICD-10-CM

## 2021-10-15 DIAGNOSIS — I10 Essential (primary) hypertension: Secondary | ICD-10-CM

## 2021-10-16 ENCOUNTER — Ambulatory Visit (INDEPENDENT_AMBULATORY_CARE_PROVIDER_SITE_OTHER): Payer: Medicare Other | Admitting: Internal Medicine

## 2021-10-16 ENCOUNTER — Encounter: Payer: Self-pay | Admitting: Internal Medicine

## 2021-10-16 VITALS — BP 98/62 | HR 74 | Temp 98.2°F | Ht 63.0 in | Wt 99.0 lb

## 2021-10-16 DIAGNOSIS — I272 Pulmonary hypertension, unspecified: Secondary | ICD-10-CM | POA: Diagnosis not present

## 2021-10-16 DIAGNOSIS — Z13820 Encounter for screening for osteoporosis: Secondary | ICD-10-CM | POA: Diagnosis not present

## 2021-10-16 DIAGNOSIS — E559 Vitamin D deficiency, unspecified: Secondary | ICD-10-CM | POA: Diagnosis not present

## 2021-10-16 DIAGNOSIS — I10 Essential (primary) hypertension: Secondary | ICD-10-CM | POA: Diagnosis not present

## 2021-10-16 DIAGNOSIS — J432 Centrilobular emphysema: Secondary | ICD-10-CM

## 2021-10-16 DIAGNOSIS — J431 Panlobular emphysema: Secondary | ICD-10-CM

## 2021-10-16 DIAGNOSIS — Z0000 Encounter for general adult medical examination without abnormal findings: Secondary | ICD-10-CM | POA: Diagnosis not present

## 2021-10-16 MED ORDER — ALBUTEROL SULFATE HFA 108 (90 BASE) MCG/ACT IN AERS: INHALATION_SPRAY | RESPIRATORY_TRACT | 10 refills | Status: DC

## 2021-10-16 NOTE — Progress Notes (Signed)
Established Patient Office Visit     This visit occurred during the SARS-CoV-2 public health emergency.  Safety protocols were in place, including screening questions prior to the visit, additional usage of staff PPE, and extensive cleaning of exam room while observing appropriate contact time as indicated for disinfecting solutions.    CC/Reason for Visit: Annual preventive exam and subsequent Medicare wellness visit  HPI: [name] is a 76 y.o. female who is coming in today for the above mentioned reasons. 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  In November she was hospitalized for a periprosthetic right hip dislocation requiring intraoperative repair.  Her daughter is concerned that she has been losing weight and not eating well.  They are requesting a new prescription for a nebulizer and a bone density test.  She has an appointment coming soon with her pulmonologist.  She wears 2-1/2 to 3 L of oxygen continuously, despite this her sats were only 87% in office today.  She does not feel short of breath beyond her baseline.  She has routine eye care, she is in the process of getting dental evaluation as she has been losing teeth, they are thinking she may need dentures.  She no longer pursues cancer screening due to her age.   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   Depression 06/08/2007   Essential hypertension, benign 06/08/2007   Family history of breast cancer    GERD 01/18/2009   Osteoporosis 12/31/2012   Left femur had T score -2.5 on DEXA scan 02/26/2009.    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   Vitamin D deficiency     Past Surgical History:  Procedure Laterality Date   CATARACT EXTRACTION Right    COLONOSCOPY  2002   HIP ARTHROPLASTY Right    HIP CLOSED REDUCTION Right 04/08/2021   Procedure: CLOSED REDUCTION HIP;  Surgeon: Mcarthur Rossetti, MD;  Location: [facility];  Service: Orthopedics;  Laterality: Right;   HIP CLOSED REDUCTION Right 09/15/2021   Procedure: CLOSED REDUCTION HIP;  Surgeon: Melina Schools, MD;  Location: [facility];  Service: Orthopedics;  Laterality: Right;   TUBAL LIGATION      Social History:  reports that she quit smoking about 11 years ago. Her smoking use included cigarettes. She has a 7.50 pack-year smoking history. She has never used smokeless tobacco. She reports current alcohol use. She reports that she does not use drugs.  Allergies: Allergies  Allergen Reactions   Ace Inhibitors Swelling   Lisinopril Swelling    REACTION: Angioedema   Valsartan Swelling    REACTION: angioedema.  Pt should not get ACEI or ARBs of any kind due to angioedema.   Aspirin Other (See Comments)    REACTION: GI ulcer.   Penicillins     Childhood allergy Has patient had a PCN reaction causing immediate rash, facial/tongue/throat swelling, SOB or lightheadedness with hypotension: Unknown Has patient had a PCN reaction causing severe rash involving mucus membranes or skin necrosis: Unknown Has patient had a PCN reaction that required hospitalization: Unknown Has patient had  a PCN reaction occurring within the last 10 years: Unknown If all of the above answers are "NO", then may proceed with Cephalosporin use.     Family History:  Family History  Problem Relation Age of Onset   Heart disease Mother    Breast cancer Mother 26   Stroke Father    Heart disease Sister    Asthma Daughter    Cancer Other    Diabetes Other    Hypertension Other    COPD Brother        "9/11 survivor"   COPD Sister        "9/11 survivor"    Breast cancer Maternal Grandmother        dx in her 7s   Breast cancer Other        MGMs mother died in her 25s   Colon cancer Neg Hx      Current Outpatient Medications:    acetaminophen (TYLENOL) 500 MG tablet, Take 1,000 mg by mouth every 6 (six) hours as needed for moderate pain., Disp: , Rfl:    albuterol (PROVENTIL) (2.5 MG/3ML) 0.083% nebulizer solution, 3ML EVERY 6 HOURS AS NEEDED FOR WHEEZING, Disp: 75 mL, Rfl: 10   amLODipine (NORVASC) 5 MG tablet, TAKE ONE TABLET BY MOUTH DAILY, Disp: 90 tablet, Rfl: 0   famotidine (PEPCID) 20 MG tablet, TAKE ONE TABLET BY MOUTH TWICE DAILY, Disp: 60 tablet, Rfl: 5   furosemide (LASIX) 20 MG tablet, TAKE ONE TABLET BY MOUTH EVERY DAY, Disp: 30 tablet, Rfl: 5   gabapentin (NEURONTIN) 100 MG capsule, TAKE ONE TABLET BY MOUTH THREE TIMES DAILY, Disp: 90 capsule, Rfl: 2   hydrochlorothiazide (HYDRODIURIL) 25 MG tablet, TAKE ONE-HALF TO 1 TABLET BY MOUTH DAILY AS NEEDED FOR SWELLING, Disp: 45 tablet, Rfl: 1   HYDROcodone-acetaminophen (NORCO) 5-325 MG tablet, Take 1 tablet by mouth every 6 (six) hours as needed., Disp: 20 tablet, Rfl: 0   ipratropium-albuterol (DUONEB) 0.5-2.5 (3) MG/3ML SOLN, USE 3 ML VIA NEBULIZER EVERY 4 HOURS AS NEEDED, Disp: 2970 mL, Rfl: 6   loratadine (CLARITIN) 10 MG tablet, Take 1 tablet (10 mg total) by mouth daily., Disp: 90 tablet, Rfl: 1   Magnesium 500 MG TABS, Take 1 tablet (500 mg total) by mouth at bedtime., Disp: 90 tablet, Rfl: 1   melatonin 5 MG TABS, TAKE TWO TABLETS BY MOUTH AT BEDTIME, Disp: 60 tablet, Rfl: 5   metoprolol succinate (TOPROL-XL) 50 MG 24 hr tablet, TAKE ONE TABLET BY MOUTH EVERY DAY WITH OR IMMEDIATELY FOLLOWING A MEAL, Disp: 30 tablet, Rfl: 5   mirtazapine (REMERON) 15 MG tablet, NEW PRESCRIPTION REQUEST: TAKE ONE TABLET BY MOUTH AT BEDTIME, Disp: 90 tablet, Rfl: 3   pantoprazole (PROTONIX) 40 MG tablet, TAKE ONE TABLET BY MOUTH DAILY, Disp: 30 tablet, Rfl: 1   potassium citrate (UROCIT-K) 10 MEQ  (1080 MG) SR tablet, TAKE ONE TABLET BY MOUTH EVERY DAY, Disp: 30 tablet, Rfl: 5   pravastatin (PRAVACHOL) 40 MG tablet, TAKE ONE TABLET BY MOUTH IN THE EVENING, Disp: 90 tablet, Rfl: 0   Vitamin D, Ergocalciferol, (DRISDOL) 1.25 MG (50000 UNIT) CAPS capsule, TAKE ONE CAPSULE BY MOUTH EVERY WEEK, Disp: 12 capsule, Rfl: 11   WIXELA INHUB 250-50 MCG/DOSE AEPB, Inhale 1 puff into the lungs 2 (two) times daily., Disp: , Rfl:    albuterol (VENTOLIN HFA) 108 (90 Base) MCG/ACT inhaler, INHALE TWO PUFFS EVERY 6 HOURS AS NEEDED FOR WHEEZING, Disp: 54 g, Rfl: 10  Review of Systems:  Constitutional:  Denies fever, chills, diaphoresis, appetite change and fatigue.  HEENT: Denies photophobia, eye pain, redness, hearing loss, ear pain, congestion, sore throat, rhinorrhea, sneezing, mouth sores, trouble swallowing, neck pain, neck stiffness and tinnitus.   Respiratory: Denies SOB, DOE, cough, chest tightness,  and wheezing.   Cardiovascular: Denies chest pain, palpitations and leg swelling.  Gastrointestinal: Denies nausea, vomiting, abdominal pain, diarrhea, constipation, blood in stool and abdominal distention.  Genitourinary: Denies dysuria, urgency, frequency, hematuria, flank pain and difficulty urinating.  Endocrine: Denies: hot or cold intolerance, sweats, changes in hair or nails, polyuria, polydipsia. Musculoskeletal: Denies myalgias, back pain, joint swelling, arthralgias and gait problem.  Skin: Denies pallor, rash and wound.  Neurological: Denies dizziness, seizures, syncope,  light-headedness, numbness and headaches.  Hematological: Denies adenopathy. Easy bruising, personal or family bleeding history  Psychiatric/Behavioral: Denies suicidal ideation, mood changes, confusion, nervousness, sleep disturbance and agitation    Physical Exam: Vitals:   10/16/21 1511  BP: 98/62  Pulse: 74  Temp: 98.2 F (36.8 C)  TempSrc: Oral  SpO2: (!) 87%  Weight: 99 lb (44.9 kg)  Height: '5\' 3"'$  (1.6 m)     Body mass index is 17.54 kg/m.   Constitutional: NAD, calm, comfortable Eyes: PERRL, lids and conjunctivae normal ENMT: Mucous membranes are moist. Posterior pharynx clear of any exudate or lesions. Normal dentition. Tympanic membrane is pearly white, no erythema or bulging. Neck: normal, supple, no masses, no thyromegaly Respiratory: clear to auscultation bilaterally, no wheezing, no crackles. Normal respiratory effort. No accessory muscle use.  Cardiovascular: Regular rate and rhythm, no murmurs / rubs / gallops. No extremity edema.  Abdomen: no tenderness, no masses palpated. No hepatosplenomegaly. Bowel sounds positive.  Musculoskeletal: no clubbing / cyanosis. No joint deformity upper and lower extremities. Good ROM, no contractures. Normal muscle tone.  Skin: no rashes, lesions, ulcers. No induration Neurologic: CN 2-12 grossly intact. Sensation intact, DTR normal. Strength 5/5 in all 4.  Psychiatric: Normal judgment and insight. Alert and oriented x 3. Normal mood.    Subsequent Medicare wellness visit   1. Risk factors, based on past  M,S,F -cardiovascular disease risk factors include age, history of hyperlipidemia, hypertension   2.  Physical activities: Sedentary other than activities of daily living   3.  Depression/mood: Stable, not depressed   4.  Hearing: No perceived issues   5.  ADL's: She is able to bathe groom and cook by herself   6.  Fall risk: High fall risk   7.  Home safety: No problems identified   8.  Height weight, and visual acuity: height and weight as above, vision:  She has declined vision evaluation today   9.  Counseling: Advised to update immunization status and DEXA scan   10. Lab orders based on risk factors: Laboratory update will be reviewed   11. Referral : DEXA scan   12. Care plan: Follow-up with me in 8 weeks for weight   13. Cognitive assessment: No cognitive impairment   14. Screening: Patient provided with a written and  personalized 5-10 year screening schedule in the AVS. yes   15. Provider List Update: PCP, pulmonologist  16. Advance Directives: Full code   17. Opioids: She has a transient opioid prescription for her shoulder pain but is not on chronic narcotics.   [facility] Office Visit from 12/26/2015 in [place]  PHQ-9 Total Score 18       Fall Risk 09/22/2020 09/23/2020 04/08/2021 09/15/2021 10/16/2021  Falls in the past  year? - - - - 1  Number of falls in past year - - - - -  Was there an injury with Fall? - - - - 1  Fall Risk Category Calculator - - - - 3  Fall Risk Category - - - - High  Patient Fall Risk Level High fall risk High fall risk High fall risk Moderate fall risk -  Patient at Risk for Falls Due to - - - - -  Patient at Risk for Falls Due to - - - - -  Fall risk Follow up - - - - Falls evaluation completed     Impression and Plan:  Encounter for preventive health examination -Recommend routine eye and dental care. -Immunizations: Advised to get COVID vaccines and shingles vaccines at the pharmacy -Healthy lifestyle discussed in detail. -Labs to be updated today. -Colon cancer screening: Deferred due to age -Breast cancer screening: Deferred due to age -Cervical cancer screening: Deferred due to age -Lung cancer screening: Deferred due to age -Prostate cancer screening: Not applicable -DEXA: Ordered  Screening for osteoporosis - Plan: DG Bone Density  Pulmonary hypertension ([place])  Panlobular emphysema ([place]) - Plan: For home use only DME Nebulizer machine, CBC with Differential/Platelet, Comprehensive metabolic panel, Lipid panel, VITAMIN D 25 Hydroxy (Vit-D Deficiency, Fractures), Vitamin B12, Vitamin B12, VITAMIN D 25 Hydroxy (Vit-D Deficiency, Fractures), Lipid panel, Comprehensive metabolic panel, CBC with Differential/Platelet  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  -Lab work today; will notify you once results are available.' -Schedule follow up in 8 weeks to follow on your weight.    Lelon Frohlich, MD [facility]

## 2021-10-16 NOTE — Patient Instructions (Signed)
-  Nice seeing you today!! ? ?-Lab work today; will notify you once results are available.' ?-Schedule follow up in 8 weeks to follow on your weight. ?

## 2021-10-17 ENCOUNTER — Telehealth: Payer: Self-pay | Admitting: *Deleted

## 2021-10-17 DIAGNOSIS — E559 Vitamin D deficiency, unspecified: Secondary | ICD-10-CM

## 2021-10-17 LAB — CBC WITH DIFFERENTIAL/PLATELET
Basophils Absolute: 0.1 10*3/uL (ref 0.0–0.1)
Basophils Relative: 1.3 % (ref 0.0–3.0)
Eosinophils Absolute: 0.3 10*3/uL (ref 0.0–0.7)
Eosinophils Relative: 4.5 % (ref 0.0–5.0)
HCT: 31 % — ABNORMAL LOW (ref 36.0–46.0)
Hemoglobin: 10.2 g/dL — ABNORMAL LOW (ref 12.0–15.0)
Lymphocytes Relative: 20.8 % (ref 12.0–46.0)
Lymphs Abs: 1.4 10*3/uL (ref 0.7–4.0)
MCHC: 32.9 g/dL (ref 30.0–36.0)
MCV: 85 fl (ref 78.0–100.0)
Monocytes Absolute: 0.5 10*3/uL (ref 0.1–1.0)
Monocytes Relative: 6.8 % (ref 3.0–12.0)
Neutro Abs: 4.6 10*3/uL (ref 1.4–7.7)
Neutrophils Relative %: 66.6 % (ref 43.0–77.0)
Platelets: 396 10*3/uL (ref 150.0–400.0)
RBC: 3.65 Mil/uL — ABNORMAL LOW (ref 3.87–5.11)
RDW: 16.5 % — ABNORMAL HIGH (ref 11.5–15.5)
WBC: 6.9 10*3/uL (ref 4.0–10.5)

## 2021-10-17 LAB — COMPREHENSIVE METABOLIC PANEL
ALT: 6 U/L (ref 0–35)
AST: 21 U/L (ref 0–37)
Albumin: 3.2 g/dL — ABNORMAL LOW (ref 3.5–5.2)
Alkaline Phosphatase: 103 U/L (ref 39–117)
BUN: 15 mg/dL (ref 6–23)
CO2: 35 mEq/L — ABNORMAL HIGH (ref 19–32)
Calcium: 9.3 mg/dL (ref 8.4–10.5)
Chloride: 90 mEq/L — ABNORMAL LOW (ref 96–112)
Creatinine, Ser: 1.26 mg/dL — ABNORMAL HIGH (ref 0.40–1.20)
GFR: 41.79 mL/min — ABNORMAL LOW (ref >60.00)
Glucose, Bld: 76 mg/dL (ref 70–99)
Potassium: 3.5 mEq/L (ref 3.5–5.1)
Sodium: 136 mEq/L (ref 135–145)
Total Bilirubin: 0.3 mg/dL (ref 0.2–1.2)
Total Protein: 6.9 g/dL (ref 6.0–8.3)

## 2021-10-17 LAB — LIPID PANEL
Cholesterol: 134 mg/dL (ref 0–200)
HDL: 71.1 mg/dL (ref >39.00)
LDL Cholesterol: 42 mg/dL (ref 0–99)
NonHDL: 62.7
Total CHOL/HDL Ratio: 2
Triglycerides: 104 mg/dL (ref 0.0–149.0)
VLDL: 20.8 mg/dL (ref 0.0–40.0)

## 2021-10-17 LAB — VITAMIN D 25 HYDROXY (VIT D DEFICIENCY, FRACTURES): VITD: 110.61 ng/mL (ref 30.00–100.00)

## 2021-10-17 LAB — VITAMIN B12: Vitamin B-12: 211 pg/mL (ref 211–911)

## 2021-10-17 NOTE — Telephone Encounter (Signed)
CRITICAL VALUE STICKER ? ?CRITICAL VALUE: vit d 110.61 ? ?RECEIVER (on-site recipient of call):Tiera ? ?DATE & TIME NOTIFIED: 10/17/21 12:56 ? ?MESSENGER (representative from lab): ? ?MD NOTIFIED: Jerilee Hoh ? ?TIME OF NOTIFICATION:12:56 ? ?RESPONSE:  waiting on Dr Ledell Noss response ?

## 2021-10-17 NOTE — Telephone Encounter (Signed)
Daughter is aware per DPR .  Lab ordered.  ?

## 2021-10-22 ENCOUNTER — Encounter: Payer: Self-pay | Admitting: Internal Medicine

## 2021-10-22 ENCOUNTER — Other Ambulatory Visit: Payer: Self-pay | Admitting: Internal Medicine

## 2021-10-22 DIAGNOSIS — D649 Anemia, unspecified: Secondary | ICD-10-CM

## 2021-10-22 DIAGNOSIS — I10 Essential (primary) hypertension: Secondary | ICD-10-CM

## 2021-10-22 DIAGNOSIS — E538 Deficiency of other specified B group vitamins: Secondary | ICD-10-CM | POA: Insufficient documentation

## 2021-10-22 MED ORDER — LORATADINE 10 MG PO TABS: 10.0000 mg | ORAL_TABLET | Freq: Every day | ORAL | 1 refills | Status: AC

## 2021-10-23 ENCOUNTER — Other Ambulatory Visit: Payer: Self-pay | Admitting: Internal Medicine

## 2021-10-23 DIAGNOSIS — N179 Acute kidney failure, unspecified: Secondary | ICD-10-CM

## 2021-10-23 DIAGNOSIS — E559 Vitamin D deficiency, unspecified: Secondary | ICD-10-CM

## 2021-10-23 MED ORDER — CYANOCOBALAMIN 1000 MCG/ML IJ SOLN: INTRAMUSCULAR | 3 refills | Status: DC

## 2021-10-23 MED ORDER — "BD SAFETYGLIDE SYRINGE/NEEDLE 25G X 1"" 3 ML MISC": 11 refills | Status: DC

## 2021-10-24 ENCOUNTER — Other Ambulatory Visit (INDEPENDENT_AMBULATORY_CARE_PROVIDER_SITE_OTHER): Payer: Medicare Other

## 2021-10-24 ENCOUNTER — Other Ambulatory Visit: Payer: Self-pay | Admitting: Internal Medicine

## 2021-10-24 ENCOUNTER — Ambulatory Visit (INDEPENDENT_AMBULATORY_CARE_PROVIDER_SITE_OTHER): Payer: Medicare Other

## 2021-10-24 ENCOUNTER — Encounter: Payer: Self-pay | Admitting: Nurse Practitioner

## 2021-10-24 ENCOUNTER — Other Ambulatory Visit: Payer: Self-pay

## 2021-10-24 ENCOUNTER — Ambulatory Visit: Payer: Medicare Other | Admitting: Nurse Practitioner

## 2021-10-24 VITALS — BP 130/82 | HR 101 | Wt 99.0 lb

## 2021-10-24 DIAGNOSIS — D869 Sarcoidosis, unspecified: Secondary | ICD-10-CM

## 2021-10-24 DIAGNOSIS — E538 Deficiency of other specified B group vitamins: Secondary | ICD-10-CM | POA: Diagnosis not present

## 2021-10-24 DIAGNOSIS — D649 Anemia, unspecified: Secondary | ICD-10-CM | POA: Diagnosis not present

## 2021-10-24 DIAGNOSIS — J9611 Chronic respiratory failure with hypoxia: Secondary | ICD-10-CM

## 2021-10-24 DIAGNOSIS — N179 Acute kidney failure, unspecified: Secondary | ICD-10-CM | POA: Diagnosis not present

## 2021-10-24 DIAGNOSIS — R0609 Other forms of dyspnea: Secondary | ICD-10-CM | POA: Diagnosis not present

## 2021-10-24 DIAGNOSIS — J441 Chronic obstructive pulmonary disease with (acute) exacerbation: Secondary | ICD-10-CM | POA: Diagnosis not present

## 2021-10-24 DIAGNOSIS — E876 Hypokalemia: Secondary | ICD-10-CM

## 2021-10-24 DIAGNOSIS — R42 Dizziness and giddiness: Secondary | ICD-10-CM

## 2021-10-24 LAB — BASIC METABOLIC PANEL
BUN: 11 mg/dL (ref 6–23)
CO2: 41 mEq/L — ABNORMAL HIGH (ref 19–32)
Calcium: 9.1 mg/dL (ref 8.4–10.5)
Chloride: 86 mEq/L — ABNORMAL LOW (ref 96–112)
Creatinine, Ser: 1.14 mg/dL (ref 0.40–1.20)
GFR: 47.12 mL/min — ABNORMAL LOW (ref >60.00)
Glucose, Bld: 94 mg/dL (ref 70–99)
Potassium: 2.9 mEq/L — ABNORMAL LOW (ref 3.5–5.1)
Sodium: 134 mEq/L — ABNORMAL LOW (ref 135–145)

## 2021-10-24 LAB — FERRITIN: Ferritin: 56.4 ng/mL (ref 10.0–291.0)

## 2021-10-24 LAB — IBC PANEL
Iron: 36 ug/dL — ABNORMAL LOW (ref 42–145)
Saturation Ratios: 15.3 % — ABNORMAL LOW (ref 20.0–50.0)
TIBC: 235.2 ug/dL — ABNORMAL LOW (ref 250.0–450.0)
Transferrin: 168 mg/dL — ABNORMAL LOW (ref 212.0–360.0)

## 2021-10-24 MED ORDER — POTASSIUM CHLORIDE CRYS ER 20 MEQ PO TBCR: 20.0000 meq | EXTENDED_RELEASE_TABLET | Freq: Every day | ORAL | 0 refills | Status: DC

## 2021-10-24 MED ORDER — DOXYCYCLINE HYCLATE 100 MG PO TABS: 100.0000 mg | ORAL_TABLET | Freq: Two times a day (BID) | ORAL | 0 refills | Status: AC

## 2021-10-24 MED ORDER — BREZTRI AEROSPHERE 160-9-4.8 MCG/ACT IN AERO: 2.0000 | INHALATION_SPRAY | Freq: Two times a day (BID) | RESPIRATORY_TRACT | 5 refills | Status: DC

## 2021-10-24 MED ORDER — PREDNISONE 20 MG PO TABS: 40.0000 mg | ORAL_TABLET | Freq: Every day | ORAL | 0 refills | Status: AC

## 2021-10-24 MED ORDER — BREZTRI AEROSPHERE 160-9-4.8 MCG/ACT IN AERO: 2.0000 | INHALATION_SPRAY | Freq: Two times a day (BID) | RESPIRATORY_TRACT | 0 refills | Status: DC

## 2021-10-24 MED ORDER — CYANOCOBALAMIN 1000 MCG/ML IJ SOLN
1000.0000 ug | Freq: Once | INTRAMUSCULAR | Status: AC
  Administered 2021-10-24: 1000 ug via INTRAMUSCULAR

## 2021-10-24 NOTE — Progress Notes (Signed)
@Patient  ID: [name], female    DOB: Sep 22, 1945, 76 y.o.   MRN: [id number]  No chief complaint on file.   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  She is a patient of Dr. Trena Platt and last seen in office on 04/17/2021.  Past medical history significant for hypertension, allergic rhinitis, GERD, IBS, encephalopathy, OA, HLD, prolonged QT interval, vitamin B12 deficiency.  TEST/EVENTS:  04/08/2021 CXR: Severe chronic lung disease with fibrosis and elevation of the hila.  No acute superimposed process.  04/17/2021: OV with Dr. Wynona Neat.  Reported some recent weight loss with poor appetite which was around the same time that she had lost her husband.  Did have some occasional swelling in her legs  When she was recently in the hospital for AECOPD.  Advised to follow-up with cardiology.  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  Maintained on Wixela.  Continued on supplemental oxygen via POC.  Follow-up 6 months  10/24/2021: Today-follow-up Patient presents today with daughter and granddaughter.  She reports that her breathing has been worse over the last 1 to 2 weeks.  She has had increased shortness of breath and a productive cough as well as some right sided pain. She also had a episode around 2 weeks ago where she became lightheaded, dizzy and fell.  She hit her head on the floor.  She has been unsteady on her feet and had lightheadedness since.  Her PCP ordered multiple labs and found that she was anemic and her her vitamin B12 was low as well as worsening kidney function.  They ordered further work-up.  Her BMET came back today with normal creatinine; however her potassium was noted to be 2.9.  Her IBC indicated iron deficiency anemia.  She was also noted to be tachycardic at her visit.  No current increasing oxygen requirements however, when she was at her  PCP appointment she was noted to be 87% on her normal supplemental 2 L/min.  She was 91% today in office on 2 L.  She denies hemoptysis, lower extremity swelling or pain, palpitations or chest discomfort.  She has been off her inhalers for a while and using albuterol frequently.  Allergies  Allergen Reactions   Ace Inhibitors Swelling   Lisinopril Swelling    REACTION: Angioedema   Valsartan Swelling    REACTION: angioedema.  Pt should not get ACEI or ARBs of any kind due to angioedema.   Aspirin Other (See Comments)    REACTION: GI ulcer.   Penicillins     Childhood allergy Has patient had a PCN reaction causing immediate rash, facial/tongue/throat swelling, SOB or lightheadedness with hypotension: Unknown Has patient had a PCN reaction causing severe rash involving mucus membranes or skin necrosis: Unknown Has patient had a PCN reaction that required hospitalization: Unknown Has patient had a PCN reaction occurring within the last 10 years: Unknown If all of the above answers are "NO", then may proceed with Cephalosporin use.     Immunization History  Administered Date(s) Administered   Fluad Quad(high Dose 65+) 07/18/2021   Influenza Split 05/28/2011   Influenza Whole 06/06/2009   Influenza, High Dose Seasonal PF 06/29/2017   Influenza,inj,Quad PF,6+ Mos 04/15/2013, 04/11/2014, 10/02/2015, 08/18/2016, 05/24/2018   Influenza-Unspecified 05/11/2020   Pneumococcal Conjugate-13 08/18/2016   Pneumococcal Polysaccharide-23 08/11/2005, 05/28/2011   Td 08/11/2005, 08/11/2017    Past Medical History:  Diagnosis Date   Blind  left eye    Cataract of both eyes 01/18/2009   Right surgically repaired   COPD with emphysema (HCC) 02/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   Depression 06/08/2007   Essential hypertension, benign 06/08/2007   Family history of breast cancer    GERD 01/18/2009   Osteoporosis 12/31/2012   Left femur had T score -2.5 on  DEXA scan 02/26/2009.    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   Vitamin D deficiency     Tobacco History: Social History   Tobacco Use  Smoking Status Former   Packs/day: 0.25   Years: 30.00   Pack years: 7.50   Types: Cigarettes   Quit date: 05/11/2010   Years since quitting: 11.4  Smokeless Tobacco Never   Counseling given: Not Answered   Outpatient Medications Prior to Visit  Medication Sig Dispense Refill   acetaminophen (TYLENOL) 500 MG tablet Take 1,000 mg by mouth every 6 (six) hours as needed for moderate pain.     albuterol (PROVENTIL) (2.5 MG/3ML) 0.083% nebulizer solution EVERY 6 HOURS AS NEEDED FOR WHEEZING 75 mL 10   albuterol (VENTOLIN HFA) 108 (90 Base) MCG/ACT inhaler INHALE TWO PUFFS EVERY 6 HOURS AS NEEDED FOR WHEEZING 54 g 10   amLODipine (NORVASC) 5 MG tablet TAKE ONE TABLET BY MOUTH DAILY 90 tablet 0   cyanocobalamin (,VITAMIN B-12,) 1000 MCG/ML injection Inject 1ml in deltoid once weekly for 4 weeks, then inject 1 ml once a month thereafter 6 mL 3   famotidine (PEPCID) 20 MG tablet TAKE ONE TABLET BY MOUTH TWICE DAILY 60 tablet 5   furosemide (LASIX) 20 MG tablet TAKE ONE TABLET BY MOUTH EVERY DAY 30 tablet 5   gabapentin (NEURONTIN) 100 MG capsule TAKE ONE TABLET BY MOUTH THREE TIMES DAILY 90 capsule 2   hydrochlorothiazide (HYDRODIURIL) 25 MG tablet TAKE ONE-HALF TO 1 TABLET BY MOUTH DAILY AS NEEDED FOR SWELLING 45 tablet 1   HYDROcodone-acetaminophen (NORCO) 5-325 MG tablet Take 1 tablet by mouth every 6 (six) hours as needed. 20 tablet 0   ipratropium-albuterol (DUONEB) 0.5-2.5 (3) MG/3ML SOLN USE 3 ML VIA NEBULIZER EVERY 4 HOURS AS NEEDED 2970 mL 6   loratadine (CLARITIN) 10 MG tablet Take 1 tablet (10 mg total) by mouth daily. 90 tablet 1   Magnesium 500 MG TABS Take 1 tablet (500 mg total) by mouth at bedtime. 90 tablet 1   melatonin 5  MG TABS TAKE TWO TABLETS BY MOUTH AT BEDTIME 60 tablet 5   metoprolol succinate (TOPROL-XL) 50 MG 24 hr tablet TAKE ONE TABLET BY MOUTH EVERY DAY WITH OR IMMEDIATELY FOLLOWING A MEAL 30 tablet 5   mirtazapine (REMERON) 15 MG tablet NEW PRESCRIPTION REQUEST: TAKE ONE TABLET BY MOUTH AT BEDTIME 90 tablet 3   pantoprazole (PROTONIX) 40 MG tablet TAKE ONE TABLET BY MOUTH DAILY 30 tablet 1   potassium citrate (UROCIT-K) 10 MEQ (1080 MG) SR tablet TAKE ONE TABLET BY MOUTH EVERY DAY 30 tablet 5   pravastatin (PRAVACHOL) 40 MG tablet TAKE ONE TABLET BY MOUTH IN THE EVENING 90 tablet 0   SYRINGE-NEEDLE, DISP, 3 ML (BD SAFETYGLIDE SYRINGE/NEEDLE) 25G X 1" 3 ML MISC Use for B12 injections 100 each 11   Vitamin D, Ergocalciferol, (DRISDOL) 1.25 MG (50000 UNIT) CAPS capsule TAKE ONE CAPSULE BY MOUTH EVERY WEEK 12  capsule 11   WIXELA INHUB 250-50 MCG/DOSE AEPB Inhale 1 puff into the lungs 2 (two) times daily.     No facility-administered medications prior to visit.     Review of Systems:   Constitutional: No weight loss or gain, night sweats, fevers, chills. +fatigue. HEENT: No headaches, difficulty swallowing, tooth/dental problems, or sore throat. No sneezing, itching, ear ache, nasal congestion, or post nasal drip CV:  No chest pain, orthopnea, PND, swelling in lower extremities, anasarca,  palpitations Resp: +shortness of breath with exertion; productive cough; occasional wheeze. No hemoptysis. No chest wall deformity GI:  No heartburn, indigestion, abdominal pain, nausea, vomiting, diarrhea, change in bowel habits, loss of appetite, bloody stools.  GU: No dysuria, change in color of urine, urgency or frequency.  No flank pain, no hematuria  Skin: No rash, lesions, ulcerations MSK:  No joint pain or swelling.  No decreased range of motion.  No back pain. Neuro: + dizziness, lightheadedness, instability   Psych: No depression or anxiety. Mood stable.     Physical Exam:  BP 130/82   Pulse (!) 101    Wt 99 lb (44.9 kg)   SpO2 92%   BMI 17.54 kg/m   GEN: Pleasant, interactive, chronically-ill appearing, elderly; in no acute distress. HEENT:  Normocephalic and atraumatic. EACs patent bilaterally. TM pearly gray with present light reflex bilaterally. PERRLA. Sclera white. Nasal turbinates pink, moist and patent bilaterally. No rhinorrhea present. Oropharynx pink and moist, without exudate or edema. No lesions, ulcerations, or postnasal drip.  NECK:  Supple w/ fair ROM. No JVD present. Normal carotid impulses w/o bruits. Thyroid symmetrical with no goiter or nodules palpated. No lymphadenopathy.   CV: RRR, no m/r/g, no peripheral edema. Pulses intact, +2 bilaterally. No cyanosis, pallor or clubbing. PULMONARY:  Unlabored, regular breathing. Scattered rhonchi bilaterally posteriorly. No accessory muscle use. No dullness to percussion. GI: BS present and normoactive. Soft, non-tender to palpation. No organomegaly or masses detected. No CVA tenderness. MSK: No erythema, warmth or tenderness. Cap refil <2 sec all extrem. No deformities or joint swelling noted.  Neuro: A/Ox3. No focal deficits noted.   Skin: Warm, no lesions or rashe Psych: Normal affect and behavior. Judgement and thought content appropriate.     Lab Results:  CBC    Component Value Date/Time   WBC 6.9 10/16/2021 1603   RBC 3.65 (L) 10/16/2021 1603   HGB 10.2 (L) 10/16/2021 1603   HCT 31.0 (L) 10/16/2021 1603   PLT 396.0 10/16/2021 1603   MCV 85.0 10/16/2021 1603   MCH 29.4 04/08/2021 2030   MCHC 32.9 10/16/2021 1603   RDW 16.5 (H) 10/16/2021 1603   LYMPHSABS 1.4 10/16/2021 1603   MONOABS 0.5 10/16/2021 1603   EOSABS 0.3 10/16/2021 1603   BASOSABS 0.1 10/16/2021 1603    BMET    Component Value Date/Time   NA 134 (L) 10/24/2021 1132   NA 137 05/24/2018 1535   K 2.9 (L) 10/24/2021 1132   CL 86 (L) 10/24/2021 1132   CO2 41 (H) 10/24/2021 1132   GLUCOSE 94 10/24/2021 1132   BUN 11 10/24/2021 1132   BUN 5  (L) 05/24/2018 1535   CREATININE 1.14 10/24/2021 1132   CREATININE 0.96 09/12/2014 1632   CALCIUM 9.1 10/24/2021 1132   GFRNONAA 52 (L) 04/08/2021 2030   GFRNONAA 61 09/12/2014 1632   GFRAA >60 08/28/2019 0535   GFRAA 70 09/12/2014 1632    BNP    Component Value Date/Time   BNP 496.8 (H) 09/18/2020 1610  Imaging:  No results found.  cyanocobalamin ((VITAMIN B-12)) injection 1,000 mcg     Date Action Dose Route User   10/24/2021 1120 Given by Other 1,000 mcg Intramuscular (Left Deltoid) Princess Perna, RN       No flowsheet data found.  No results found for: NITRICOXIDE      Assessment & Plan:   Dyspnea Worsening symptoms over the past 2 weeks with new lightheadedness and dizziness and pleuritic pain.  She has had some oxygen desaturations and is mildly tachycardic in office.  Does have a known anemia; however she has been lower in the past without these types of symptoms.  Concern for possible PE and/or superimposed infection.  Stat CTA chest ordered today.  Advised to seek emergency care if worsening shortness of breath, dizziness or palpitations occur.  Patient Instructions  -Start Breztri 2 puffs Twice daily. Brush tongue and rinse mouth afterwards -Continue Albuterol inhaler 2 puffs or duoneb 3 mL neb every 4 hours as needed for shortness of breath or wheezing. Notify if symptoms persist despite rescue inhaler/neb use. -Continue Protonix 40 mg daily -Continue Claritin 10 mg daily -Continue supplemental oxygen 2 L/min for goal oxygen level greater than 88 to 90%  -Prednisone 40 mg for 5 days. Take in AM with food  -Mucinex 600 mg Twice daily for congestion/cough -Doxycycline 100 mg Twice daily for 7 days. Take with food.   CTA chest and CT of your head. We will call you after we get results.   Call your primary care doctor and notify them that your potassium is 2.9. Please let me know if you do not get a new prescription for potassium before the end of  the day and I will send one.  Follow-up in 2 weeks with Dr. Wynona Neat or Philis Nettle. If symptoms do not improve or worsen, please contact office for sooner follow up or seek emergency care.   Head trauma Recent fall where patient hit her head on the wood floor.  Since that she has had worsening dizziness and balance instability.  We will obtain CT head without contrast for further evaluation.  Follow-up with PCP regarding anemia and low vitamin B12.  COPD with acute exacerbation (HCC) Given worsening symptoms of shortness of breath and productive cough, will treat for AECOPD with prednisone burst and Doxy course.  Step up in therapy to Breztri 2 puffs twice daily.  Continue as needed albuterol  Chronic respiratory failure with hypoxia (HCC) Oxygen stable at visit.  91% on 2 L/min.  Was noted to be 87% at PCPs office.  Advised to monitor and notify of any increasing oxygen demand.  Goal SPO2 greater than 88 to 90%.  Sarcoidosis Significant sarcoidosis with chronic interstitial lung disease and fibrosis.  Prednisone burst today.  CTA chest will help determine if there is evidence of progression for disease.  Hypokalemia Potassium noted to be 2.9 on BMET obtained at PCPs office.  Advised patient of this and to notify PCP to determine next steps.   I spent 45 minutes of dedicated to the care of this patient on the date of this encounter to include pre-visit review of records, face-to-face time with the patient discussing conditions above, post visit ordering of testing, clinical documentation with the electronic health record, making appropriate referrals as documented, and communicating necessary findings to members of the patients care team.  Noemi Chapel, NP 10/24/2021  Pt aware and understands NP's role.

## 2021-10-24 NOTE — Assessment & Plan Note (Addendum)
Given worsening symptoms of shortness of breath and productive cough, will treat for AECOPD with prednisone burst and Doxy course.  Step up in therapy to Breztri 2 puffs twice daily.  Continue as needed albuterol ?

## 2021-10-24 NOTE — Progress Notes (Signed)
Pt here for weekly B12 injection #1 of 4 per Dr Jerilee Hoh. Pt is accompanied by daughter [name] who will return demonstrating how to give IM injection so that she may give the injections to her mother at home. ?Daughter educated (paper instructions also printed & given) & effectively demonstrates IM injection. Offered opportunity to ask questions; educated on proper needle disposal. Also discussed frequency of B12 administration. Ally verb understanding of all. ? ?B12 1028mg given IM right deltoid and pt tolerated injection well. ? ? ? ?

## 2021-10-24 NOTE — Assessment & Plan Note (Signed)
Recent fall where patient hit her head on the wood floor.  Since that she has had worsening dizziness and balance instability.  We will obtain CT head without contrast for further evaluation.  Follow-up with PCP regarding anemia and low vitamin B12. ?

## 2021-10-24 NOTE — Assessment & Plan Note (Addendum)
Worsening symptoms over the past 2 weeks with new lightheadedness and dizziness and pleuritic pain.  She has had some oxygen desaturations and is mildly tachycardic in office.  Does have a known anemia; however she has been lower in the past without these types of symptoms.  Concern for possible PE and/or superimposed infection.  Stat CTA chest ordered today.  Advised to seek emergency care if worsening shortness of breath, dizziness or palpitations occur. ? ?Patient Instructions  ?-Start Breztri 2 puffs Twice daily. Brush tongue and rinse mouth afterwards ?-Continue Albuterol inhaler 2 puffs or duoneb 3 mL neb every 4 hours as needed for shortness of breath or wheezing. Notify if symptoms persist despite rescue inhaler/neb use. ?-Continue Protonix 40 mg daily ?-Continue Claritin 10 mg daily ?-Continue supplemental oxygen 2 L/min for goal oxygen level greater than 88 to 90% ? ?-Prednisone 40 mg for 5 days. Take in AM with food  ?-Mucinex 600 mg Twice daily for congestion/cough ?-Doxycycline 100 mg Twice daily for 7 days. Take with food.  ? ?CTA chest and CT of your head. We will call you after we get results.  ? ?Call your primary care doctor and notify them that your potassium is 2.9. Please let me know if you do not get a new prescription for potassium before the end of the day and I will send one. ? ?Follow-up in 2 weeks with Dr. Ander Slade or Alanson Aly. If symptoms do not improve or worsen, please contact office for sooner follow up or seek emergency care. ? ?

## 2021-10-24 NOTE — Assessment & Plan Note (Addendum)
Significant sarcoidosis with chronic interstitial lung disease and fibrosis.  Prednisone burst today.  CTA chest will help determine if there is evidence of progression for disease. ?

## 2021-10-24 NOTE — Assessment & Plan Note (Signed)
Oxygen stable at visit.  91% on 2 L/min.  Was noted to be 87% at PCPs office.  Advised to monitor and notify of any increasing oxygen demand.  Goal SPO2 greater than 88 to 90%. ?

## 2021-10-24 NOTE — Assessment & Plan Note (Addendum)
Potassium noted to be 2.9 on BMET obtained at PCPs office.  Advised patient of this and to notify PCP to determine next steps. ?

## 2021-10-24 NOTE — Patient Instructions (Addendum)
-  Start Breztri 2 puffs Twice daily. Brush tongue and rinse mouth afterwards ?-Continue Albuterol inhaler 2 puffs or duoneb 3 mL neb every 4 hours as needed for shortness of breath or wheezing. Notify if symptoms persist despite rescue inhaler/neb use. ?-Continue Protonix 40 mg daily ?-Continue Claritin 10 mg daily ?-Continue supplemental oxygen 2 L/min for goal oxygen level greater than 88 to 90% ? ?-Prednisone 40 mg for 5 days. Take in AM with food  ?-Mucinex 600 mg Twice daily for congestion/cough ?-Doxycycline 100 mg Twice daily for 7 days. Take with food.  ? ?CTA chest and CT of your head. We will call you after we get results.  ? ?Call your primary care doctor and notify them that your potassium is 2.9. Please let me know if you do not get a new prescription for potassium before the end of the day and I will send one. ? ?Follow-up in 2 weeks with Dr. Ander Slade or Alanson Aly. If symptoms do not improve or worsen, please contact office for sooner follow up or seek emergency care. ?

## 2021-10-25 ENCOUNTER — Ambulatory Visit (INDEPENDENT_AMBULATORY_CARE_PROVIDER_SITE_OTHER)
Admission: RE | Admit: 2021-10-25 | Discharge: 2021-10-25 | Disposition: A | Discharge status: Home / Self Care | Payer: Medicare Other | Source: Ambulatory Visit | Attending: Nurse Practitioner | Admitting: Nurse Practitioner

## 2021-10-25 ENCOUNTER — Telehealth: Payer: Self-pay | Admitting: Nurse Practitioner

## 2021-10-25 DIAGNOSIS — S0990XA Unspecified injury of head, initial encounter: Secondary | ICD-10-CM

## 2021-10-25 DIAGNOSIS — R0609 Other forms of dyspnea: Secondary | ICD-10-CM

## 2021-10-25 DIAGNOSIS — R42 Dizziness and giddiness: Secondary | ICD-10-CM | POA: Diagnosis not present

## 2021-10-25 DIAGNOSIS — J189 Pneumonia, unspecified organism: Secondary | ICD-10-CM

## 2021-10-25 MED ORDER — IOHEXOL 350 MG/ML SOLN
65.0000 mL | Freq: Once | INTRAVENOUS | Status: AC | PRN
  Administered 2021-10-25: 65 mL via INTRAVENOUS

## 2021-10-25 NOTE — Telephone Encounter (Signed)
Discussed CTA chest findings with daughter.  Sarcoid appeared relatively stable.  There was evidence of new nodular opacity and consolidation in the left lower lobe concerning for possible pneumonia.  Patient was treated with doxycycline 7-day course yesterday and prednisone.  We will continue with this and repeat imaging in 4 to 6 weeks to assess for resolution. No evidence of PE. Head CT did now show any acute process. There were some moderate to marked small vessel ischemic changes - advised to follow up with PCP. Verbalized understanding.  ?

## 2021-10-31 ENCOUNTER — Telehealth: Payer: Self-pay

## 2021-10-31 NOTE — Telephone Encounter (Signed)
Received fax from [place] for  [place]  patient assistance, patient's application has been DENIED due to household income exceeding 300% of the Federal Poverty level.  ? ?Will place denial letter and appeal form into Dr. Yetta Barre mailbox. Routing to nursing staff for f/u with pt--please discuss if she has any extenuating factors that may increase the likelihood of successfully overturning initial denial. ?

## 2021-10-31 NOTE — Telephone Encounter (Signed)
Please advise pt of denial claim and reason for it. Determine if she has an extenuating factors that may increase likelihood of overturning denial and ensure that her income was documented appropriately. Thanks!

## 2021-10-31 NOTE — Telephone Encounter (Signed)
Please advise 

## 2021-11-01 NOTE — Telephone Encounter (Signed)
Attempted to call pt but unable to reach. Left message for her to return call. 

## 2021-11-04 ENCOUNTER — Other Ambulatory Visit (INDEPENDENT_AMBULATORY_CARE_PROVIDER_SITE_OTHER): Payer: Medicare Other

## 2021-11-04 DIAGNOSIS — E876 Hypokalemia: Secondary | ICD-10-CM

## 2021-11-04 LAB — BASIC METABOLIC PANEL
BUN: 12 mg/dL (ref 6–23)
CO2: 32 mEq/L (ref 19–32)
Calcium: 9.8 mg/dL (ref 8.4–10.5)
Chloride: 96 mEq/L (ref 96–112)
Creatinine, Ser: 0.91 mg/dL (ref 0.40–1.20)
GFR: 61.73 mL/min (ref >60.00)
Glucose, Bld: 86 mg/dL (ref 70–99)
Potassium: 3.9 mEq/L (ref 3.5–5.1)
Sodium: 136 mEq/L (ref 135–145)

## 2021-11-18 ENCOUNTER — Ambulatory Visit: Payer: Medicare Other | Admitting: Pulmonary Disease

## 2021-11-27 ENCOUNTER — Other Ambulatory Visit: Payer: Self-pay | Admitting: Internal Medicine

## 2021-12-02 ENCOUNTER — Inpatient Hospital Stay: Admission: RE | Admit: 2021-12-02 | Payer: Medicare Other | Source: Ambulatory Visit

## 2021-12-06 ENCOUNTER — Telehealth: Payer: Self-pay | Admitting: Pulmonary Disease

## 2021-12-06 NOTE — Telephone Encounter (Signed)
I called the patient to see about re-scheduling after her CT on 12/10/21 with Dr. Jenetta Downer and the VM was full. I wanted to see if she could come in after the CT. Waiting on a call back.  ?

## 2021-12-09 ENCOUNTER — Encounter: Payer: Self-pay | Admitting: Pulmonary Disease

## 2021-12-09 ENCOUNTER — Ambulatory Visit: Payer: Medicare Other | Admitting: Pulmonary Disease

## 2021-12-09 VITALS — BP 116/74 | HR 68 | Temp 98.4°F | Ht 63.0 in | Wt 97.4 lb

## 2021-12-09 DIAGNOSIS — J9611 Chronic respiratory failure with hypoxia: Secondary | ICD-10-CM | POA: Diagnosis not present

## 2021-12-09 MED ORDER — BREZTRI AEROSPHERE 160-9-4.8 MCG/ACT IN AERO: 2.0000 | INHALATION_SPRAY | Freq: Two times a day (BID) | RESPIRATORY_TRACT | 5 refills | Status: DC

## 2021-12-09 NOTE — Patient Instructions (Signed)
Prescription for [facility] sent into pharmacy ? ?We will send in a prescription for nebulizer to the medical supply company ? ?I will see you back in 3 months ? ?We will be on the look out for your CAT scan to compare with previous ? ?Continue oxygen ? ?Use inhalers as prescribed ?

## 2021-12-09 NOTE — Progress Notes (Signed)
? ?      ?[name]    [id number]    [birth year] ? ?Primary Care Physician:Hernandez Everardo Beals, MD ? ?Referring Physician: Isaac Bliss, Rayford Halsted, MD ?[address] ?[place],  [place] [address] ? ?Chief complaint:  ?Patient with a history of chronic obstructive pulmonary disease, chronic shortness of breath ?History of sarcoidosis ?In for follow-up today ?HPI:  ? ?Recent hospitalization for COPD exacerbation ?Breathing has been relatively stable ? ?She does have more difficulty with the heat and humidity ? ?Recently started on Breztri ? ?Recent CT scan findings of nodular change at the base of the lung ?Has a CT scheduled for tomorrow to compare previous with ? ?Has not been able to use a nebulizer as the machine broke ? ?Does have a history of COPD, history of sarcoidosis with scarring ? ?She does have occasional cough, no significant sputum production at present Limited with activities ? ? ?COPD diagnosis about 2003 ?History of sarcoidosis about the same time-did use a chronic course of steroids back then ? ?Breathing is difficult especially with weather changes ? ?She did have some swelling of the legs when she was recently in the hospital ?It was recommended that she follow-up with cardiology as well ? ?Occupation: No pertinent history ?Smoking history: Reformed smoker ? ? ?Outpatient Encounter Medications as of 12/09/2021  ?Medication Sig  ? acetaminophen (TYLENOL) 500 MG tablet Take 1,000 mg by mouth every 6 (six) hours as needed for moderate pain.  ? albuterol (PROVENTIL) (2.5 MG/3ML) 0.083% nebulizer solution 3ML EVERY 6 HOURS AS NEEDED FOR WHEEZING  ? albuterol (VENTOLIN HFA) 108 (90 Base) MCG/ACT inhaler INHALE TWO PUFFS EVERY 6 HOURS AS NEEDED FOR WHEEZING  ? amLODipine (NORVASC) 5 MG tablet TAKE ONE TABLET BY MOUTH DAILY  ? Budeson-Glycopyrrol-Formoterol (BREZTRI AEROSPHERE) 160-9-4.8 MCG/ACT AERO Inhale 2 puffs into the lungs in the morning and at bedtime.  ? cyanocobalamin  (,VITAMIN B-12,) 1000 MCG/ML injection Inject 57m in deltoid once weekly for 4 weeks, then inject 1 ml once a month thereafter  ? famotidine (PEPCID) 20 MG tablet TAKE ONE TABLET BY MOUTH TWICE DAILY  ? furosemide (LASIX) 20 MG tablet TAKE ONE TABLET BY MOUTH EVERY DAY  ? gabapentin (NEURONTIN) 100 MG capsule TAKE ONE TABLET BY MOUTH THREE TIMES DAILY  ? hydrochlorothiazide (HYDRODIURIL) 25 MG tablet TAKE ONE-HALF TO 1 TABLET BY MOUTH DAILY AS NEEDED FOR SWELLING  ? HYDROcodone-acetaminophen (NORCO) 5-325 MG tablet Take 1 tablet by mouth every 6 (six) hours as needed.  ? ipratropium-albuterol (DUONEB) 0.5-2.5 (3) MG/3ML SOLN USE 3 ML VIA NEBULIZER EVERY 4 HOURS AS NEEDED  ? loratadine (CLARITIN) 10 MG tablet Take 1 tablet (10 mg total) by mouth daily.  ? Magnesium 500 MG TABS Take 1 tablet (500 mg total) by mouth at bedtime.  ? melatonin 5 MG TABS TAKE TWO TABLETS BY MOUTH AT BEDTIME  ? metoprolol succinate (TOPROL-XL) 50 MG 24 hr tablet TAKE ONE TABLET BY MOUTH EVERY DAY WITH OR IMMEDIATELY FOLLOWING A MEAL  ? mirtazapine (REMERON) 15 MG tablet TAKE ONE TABLET BY MOUTH AT BEDTIME  ? pantoprazole (PROTONIX) 40 MG tablet TAKE ONE TABLET BY MOUTH DAILY  ? potassium citrate (UROCIT-K) 10 MEQ (1080 MG) SR tablet TAKE ONE TABLET BY MOUTH EVERY DAY  ? pravastatin (PRAVACHOL) 40 MG tablet TAKE ONE TABLET BY MOUTH IN THE EVENING  ? SYRINGE-NEEDLE, DISP, 3 ML (BD SAFETYGLIDE SYRINGE/NEEDLE) 25G X 1" 3 ML MISC Use for B12 injections  ? Vitamin D,  Ergocalciferol, (DRISDOL) 1.25 MG (50000 UNIT) CAPS capsule TAKE ONE CAPSULE BY MOUTH EVERY WEEK  ? potassium chloride SA (KLOR-CON M) 20 MEQ tablet Take 1 tablet (20 mEq total) by mouth daily for 7 days.  ? [DISCONTINUED] Budeson-Glycopyrrol-Formoterol (BREZTRI AEROSPHERE) 160-9-4.8 MCG/ACT AERO Inhale 2 puffs into the lungs in the morning and at bedtime. (Patient not taking: Reported on 12/09/2021)  ? ?No facility-administered encounter medications on file as of 12/09/2021.   ? ? ?Allergies as of 12/09/2021 - Review Complete 12/09/2021  ?Allergen Reaction Noted  ? Ace inhibitors Swelling 10/09/2011  ? Lisinopril Swelling   ? Valsartan Swelling   ? Aspirin Other (See Comments)   ? Penicillins  06/08/2007  ? ? ?Past Medical History:  ?Diagnosis Date  ? Blind left eye   ? Cataract of both eyes 01/18/2009  ? Right surgically repaired  ? 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 PFTs 03/30/2009: FVC 67%, FEV1 48%, FEV1/FVC 52%, TLC 104%, DLCO 71%.  ? Depression 06/08/2007  ? Essential hypertension, benign 06/08/2007  ? Family history of breast cancer   ? GERD 01/18/2009  ? Osteoporosis 12/31/2012  ? Left femur had T score -2.5 on DEXA scan 02/26/2009.   ? 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 right hip  ? Sarcoidosis 02/05/2009  ? PFTs 03/30/2009: FVC 67%, FEV1 48%, FEV1/FVC 52%, TLC 104%, DLCO 71%.  ? Vitamin D deficiency   ? ? ?Past Surgical History:  ?Procedure Laterality Date  ? CATARACT EXTRACTION Right   ? COLONOSCOPY  2002  ? HIP ARTHROPLASTY Right   ? HIP CLOSED REDUCTION Right 04/08/2021  ? Procedure: CLOSED REDUCTION HIP;  Surgeon: Mcarthur Rossetti, MD;  Location: [facility];  Service: Orthopedics;  Laterality: Right;  ? HIP CLOSED REDUCTION Right 09/15/2021  ? Procedure: CLOSED REDUCTION HIP;  Surgeon: Melina Schools, MD;  Location: [facility];  Service: Orthopedics;  Laterality: Right;  ? TUBAL LIGATION    ? ? ?Family History  ?Problem Relation Age of Onset  ? Heart disease Mother   ? Breast cancer Mother 4  ? Stroke Father   ? Heart disease Sister   ? Asthma Daughter   ? Cancer Other   ? Diabetes Other   ? Hypertension Other   ? COPD Brother   ?     "9/11 survivor"  ? COPD Sister   ?     "9/11 survivor"  ? Breast cancer Maternal Grandmother   ?     dx in her 52s  ? Breast cancer Other   ?     MGMs mother died in her 47s  ? Colon cancer Neg Hx   ? ? ?Social History  ? ?Socioeconomic History  ? Marital status: Widowed  ?  Spouse name: Not on file  ? Number of children: Not on file  ?  Years of education: Not on file  ? Highest education level: Not on file  ?Occupational History  ? Not on file  ?Tobacco Use  ? Smoking status: Former  ?  Packs/day: 0.25  ?  Years: 30.00  ?  Pack years: 7.50  ?  Types: Cigarettes  ?  Quit date: 05/11/2010  ?  Years since quitting: 11.5  ? Smokeless tobacco: Never  ?Vaping Use  ? Vaping Use: Never used  ?Substance and Sexual Activity  ? Alcohol use: Yes  ?  Alcohol/week: 0.0 standard drinks  ?  Comment: wine cooler.  ? Drug use: No  ? Sexual activity: Not on file  ?  Other Topics Concern  ? Not on file  ?Social History Narrative  ? Married with 4 children. Retired, previously worked as a Scientist, clinical (histocompatibility and immunogenetics).  ? ?Social Determinants of Health  ? ?Financial Resource Strain: Not on file  ?Food Insecurity: Not on file  ?Transportation Needs: Not on file  ?Physical Activity: Not on file  ?Stress: Not on file  ?Social Connections: Not on file  ?Intimate Partner Violence: Not on file  ? ? ?Review of Systems  ?Constitutional:  Positive for appetite change and unexpected weight change.  ?HENT: Negative.    ?Eyes: Negative.   ?Respiratory:  Positive for cough and shortness of breath.   ?Cardiovascular:  Positive for leg swelling.  ?Gastrointestinal: Negative.   ?Endocrine: Negative.   ?Genitourinary: Negative.   ?Musculoskeletal: Negative.   ? ?Vitals:  ? 12/09/21 1527  ?BP: 116/74  ?Pulse: 68  ?Temp: 98.4 ?F (36.9 ?C)  ?SpO2: 93%  ? ? ? ?Physical Exam ?Constitutional:   ?   Appearance: She is well-developed. She is not diaphoretic.  ?HENT:  ?   Head: Normocephalic and atraumatic.  ?Eyes:  ?   General:     ?   Right eye: No discharge.     ?   Left eye: No discharge.  ?Neck:  ?   Thyroid: No thyromegaly.  ?   Trachea: No tracheal deviation.  ?Cardiovascular:  ?   Rate and Rhythm: Normal rate and regular rhythm.  ?   Heart sounds: No murmur heard. ?  No friction rub.  ?Pulmonary:  ?   Effort: Pulmonary effort is normal. No respiratory distress.  ?   Breath sounds: No stridor.  Rhonchi present. No wheezing.  ?Musculoskeletal:  ?   Cervical back: No rigidity or tenderness.  ?Neurological:  ?   Mental Status: She is alert.  ?   Deep Tendon Reflexes: Reflexes are normal and symmetric.  ?Psychiatric:

## 2021-12-10 ENCOUNTER — Ambulatory Visit (INDEPENDENT_AMBULATORY_CARE_PROVIDER_SITE_OTHER)
Admission: RE | Admit: 2021-12-10 | Discharge: 2021-12-10 | Disposition: A | Discharge status: Home / Self Care | Payer: Medicare Other | Source: Ambulatory Visit | Attending: Nurse Practitioner | Admitting: Nurse Practitioner

## 2021-12-10 DIAGNOSIS — J189 Pneumonia, unspecified organism: Secondary | ICD-10-CM

## 2021-12-10 DIAGNOSIS — J479 Bronchiectasis, uncomplicated: Secondary | ICD-10-CM | POA: Diagnosis not present

## 2021-12-10 DIAGNOSIS — I700 Atherosclerosis of aorta: Secondary | ICD-10-CM | POA: Diagnosis not present

## 2021-12-11 ENCOUNTER — Ambulatory Visit: Payer: Medicare Other | Admitting: Internal Medicine

## 2021-12-11 NOTE — Progress Notes (Signed)
Please notify patient that CT chest scan showed resolution of pneumonia that she was previously treated for. 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 Thanks.

## 2021-12-16 DIAGNOSIS — J449 Chronic obstructive pulmonary disease, unspecified: Secondary | ICD-10-CM | POA: Diagnosis not present

## 2021-12-19 NOTE — Telephone Encounter (Signed)
She has another appointment for a follow up.  ?

## 2022-01-01 ENCOUNTER — Other Ambulatory Visit: Payer: Self-pay | Admitting: Internal Medicine

## 2022-01-01 DIAGNOSIS — I10 Essential (primary) hypertension: Secondary | ICD-10-CM

## 2022-01-01 DIAGNOSIS — I50813 Acute on chronic right heart failure: Secondary | ICD-10-CM

## 2022-01-02 ENCOUNTER — Other Ambulatory Visit: Payer: Self-pay | Admitting: Internal Medicine

## 2022-01-08 DIAGNOSIS — J449 Chronic obstructive pulmonary disease, unspecified: Secondary | ICD-10-CM | POA: Diagnosis not present

## 2022-01-16 DIAGNOSIS — J449 Chronic obstructive pulmonary disease, unspecified: Secondary | ICD-10-CM | POA: Diagnosis not present

## 2022-02-04 ENCOUNTER — Other Ambulatory Visit: Payer: Self-pay | Admitting: Internal Medicine

## 2022-02-05 ENCOUNTER — Other Ambulatory Visit: Payer: Self-pay | Admitting: *Deleted

## 2022-02-05 MED ORDER — PANTOPRAZOLE SODIUM 40 MG PO TBEC: 40.0000 mg | DELAYED_RELEASE_TABLET | Freq: Every day | ORAL | 0 refills | Status: DC

## 2022-02-05 MED ORDER — GABAPENTIN 100 MG PO CAPS: ORAL_CAPSULE | ORAL | 1 refills | Status: DC

## 2022-02-06 ENCOUNTER — Telehealth: Payer: Self-pay | Admitting: Internal Medicine

## 2022-02-06 DIAGNOSIS — E785 Hyperlipidemia, unspecified: Secondary | ICD-10-CM

## 2022-02-06 MED ORDER — PRAVASTATIN SODIUM 40 MG PO TABS: 40.0000 mg | ORAL_TABLET | Freq: Every evening | ORAL | 1 refills | Status: AC

## 2022-02-06 NOTE — Telephone Encounter (Signed)
[facility] called requesting a refill for:  pravastatin (PRAVACHOL) 40 MG tablet  [facility] - [address] [place], [place] - [address] Phone:  [phone]  Fax:  [phone]

## 2022-02-07 DIAGNOSIS — J449 Chronic obstructive pulmonary disease, unspecified: Secondary | ICD-10-CM | POA: Diagnosis not present

## 2022-02-15 DIAGNOSIS — J449 Chronic obstructive pulmonary disease, unspecified: Secondary | ICD-10-CM | POA: Diagnosis not present

## 2022-03-10 DIAGNOSIS — J449 Chronic obstructive pulmonary disease, unspecified: Secondary | ICD-10-CM | POA: Diagnosis not present

## 2022-03-13 ENCOUNTER — Telehealth: Payer: Self-pay | Admitting: Internal Medicine

## 2022-03-13 NOTE — Telephone Encounter (Signed)
[name] at  [facility] called to clarify:  Pt is taking both:  famotidine (PEPCID) 20 MG tablet  pantoprazole (PROTONIX) 40 MG tablet  *Pharmacist would like to know if Pt should continue taking both or should one be discontinued?  Please advise.  [phone]:  Opt 3 for pharmacist

## 2022-03-13 NOTE — Telephone Encounter (Signed)
Angelica with [facility] informed of the message and expressed understanding.

## 2022-03-18 DIAGNOSIS — J449 Chronic obstructive pulmonary disease, unspecified: Secondary | ICD-10-CM | POA: Diagnosis not present

## 2022-03-24 ENCOUNTER — Other Ambulatory Visit: Payer: Self-pay | Admitting: *Deleted

## 2022-03-24 DIAGNOSIS — I10 Essential (primary) hypertension: Secondary | ICD-10-CM

## 2022-03-24 MED ORDER — GABAPENTIN 100 MG PO CAPS: ORAL_CAPSULE | ORAL | 1 refills | Status: DC

## 2022-03-24 MED ORDER — AMLODIPINE BESYLATE 5 MG PO TABS: 5.0000 mg | ORAL_TABLET | Freq: Every day | ORAL | 1 refills | Status: DC

## 2022-03-24 MED ORDER — HYDROCHLOROTHIAZIDE 25 MG PO TABS: ORAL_TABLET | ORAL | 1 refills | Status: DC

## 2022-04-01 ENCOUNTER — Encounter: Payer: Self-pay | Admitting: Pulmonary Disease

## 2022-04-01 ENCOUNTER — Ambulatory Visit: Payer: Medicare Other | Admitting: Pulmonary Disease

## 2022-04-01 VITALS — BP 108/64 | HR 70 | Temp 98.6°F | Ht 63.0 in | Wt 98.0 lb

## 2022-04-01 DIAGNOSIS — D869 Sarcoidosis, unspecified: Secondary | ICD-10-CM | POA: Diagnosis not present

## 2022-04-01 DIAGNOSIS — R0602 Shortness of breath: Secondary | ICD-10-CM

## 2022-04-01 MED ORDER — MELATONIN 5 MG PO TABS: 10.0000 mg | ORAL_TABLET | Freq: Every day | ORAL | 5 refills | Status: DC

## 2022-04-01 MED ORDER — BREZTRI AEROSPHERE 160-9-4.8 MCG/ACT IN AERO: 2.0000 | INHALATION_SPRAY | Freq: Two times a day (BID) | RESPIRATORY_TRACT | 0 refills | Status: DC

## 2022-04-01 MED ORDER — CLARITHROMYCIN 250 MG PO TABS: 250.0000 mg | ORAL_TABLET | Freq: Two times a day (BID) | ORAL | 0 refills | Status: DC

## 2022-04-01 MED ORDER — FLUTICASONE-SALMETEROL 500-50 MCG/ACT IN AEPB
1.0000 | INHALATION_SPRAY | Freq: Two times a day (BID) | RESPIRATORY_TRACT | 2 refills | Status: DC
Start: 2022-04-01 — End: 2022-04-17

## 2022-04-01 MED ORDER — PREDNISONE 20 MG PO TABS: 10.0000 mg | ORAL_TABLET | Freq: Every day | ORAL | 0 refills | Status: DC

## 2022-04-01 NOTE — Patient Instructions (Signed)
I will see you in about 4 to 6 weeks  Prescription for prednisone 20 mg daily for 14 days, then cut it down to 10 mg daily for 7 days and stop Prescription for Biaxin for 7 days  Prescription for [facility] sent in  Prescription for melatonin sent 10  Call with significant concerns  Try and stay active

## 2022-04-01 NOTE — Progress Notes (Signed)
[name]    [id number]    [birth year]  Primary Care Physician:Hernandez Everardo Beals, MD  Referring Physician: Isaac Bliss, Rayford Halsted, MD [place],  [place] [address]  Chief complaint:  Patient with a history of chronic obstructive pulmonary disease, chronic shortness of breath History of sarcoidosis In for follow-up today  HPI:   Has been having some difficulty with her breathing recently especially with the changes in weather  Has not been able to use Breztri-the cost is about $100 a month  Recently hospitalized for COPD exacerbation Weather changes definitely contributing to shortness of breath  She does have a cough with sputum production  Limited with activities of daily living  Most recent CT changes does show nodular changes at the bases of the lungs worse in the upper part of the lungs, calcified adenopathy. She does have a new nebulizer unit which she has been using  Does have a history of COPD, history of sarcoidosis with scarring  COPD diagnosis about 2003 History of sarcoidosis about the same time-did use a chronic course of steroids back then  Breathing is difficult especially with weather changes  She did have some swelling of the legs when she was recently in the hospital It was recommended that she follow-up with cardiology as well  Occupation: No pertinent history Smoking history: Reformed smoker   Outpatient Encounter Medications as of 04/01/2022  Medication Sig   acetaminophen (TYLENOL) 500 MG tablet Take 1,000 mg by mouth every 6 (six) hours as needed for moderate pain.   albuterol (PROVENTIL) (2.5 MG/3ML) 0.083% nebulizer solution 3ML EVERY 6 HOURS AS NEEDED FOR WHEEZING   albuterol (VENTOLIN HFA) 108 (90 Base) MCG/ACT inhaler INHALE TWO PUFFS EVERY 6 HOURS AS NEEDED FOR WHEEZING   amLODipine (NORVASC) 5 MG tablet Take 1 tablet (5 mg total) by mouth daily.   cyanocobalamin (,VITAMIN B-12,) 1000 MCG/ML  injection Inject 56m in deltoid once weekly for 4 weeks, then inject 1 ml once a month thereafter   famotidine (PEPCID) 20 MG tablet TAKE ONE TABLET BY MOUTH TWICE DAILY   furosemide (LASIX) 20 MG tablet TAKE ONE TABLET BY MOUTH DAILY   gabapentin (NEURONTIN) 100 MG capsule TAKE ONE TABLET BY MOUTH THREE TIMES DAILY   hydrochlorothiazide (HYDRODIURIL) 25 MG tablet TAKE ONE-HALF TO 1 TABLET BY MOUTH DAILY AS NEEDED FOR SWELLING   HYDROcodone-acetaminophen (NORCO) 5-325 MG tablet Take 1 tablet by mouth every 6 (six) hours as needed.   ipratropium-albuterol (DUONEB) 0.5-2.5 (3) MG/3ML SOLN USE 3 ML VIA NEBULIZER EVERY 4 HOURS AS NEEDED   loratadine (CLARITIN) 10 MG tablet Take 1 tablet (10 mg total) by mouth daily.   Magnesium 500 MG TABS Take 1 tablet (500 mg total) by mouth at bedtime.   melatonin 5 MG TABS TAKE TWO TABLETS BY MOUTH AT BEDTIME   metoprolol succinate (TOPROL-XL) 50 MG 24 hr tablet TAKE ONE TABLET BY MOUTH DAILY WITH OR IMMEDIATELY FOLLOWING A MEAL   mirtazapine (REMERON) 15 MG tablet TAKE ONE TABLET BY MOUTH AT BEDTIME   pantoprazole (PROTONIX) 40 MG tablet Take 1 tablet (40 mg total) by mouth daily.   potassium citrate (UROCIT-K) 10 MEQ (1080 MG) SR tablet TAKE ONE TABLET BY MOUTH EVERY DAY   pravastatin (PRAVACHOL) 40 MG tablet Take 1 tablet (40 mg total) by mouth every evening.   SYRINGE-NEEDLE, DISP, 3 ML (BD SAFETYGLIDE SYRINGE/NEEDLE) 25G X 1" 3 ML MISC Use for B12 injections  Vitamin D, Ergocalciferol, (DRISDOL) 1.25 MG (50000 UNIT) CAPS capsule TAKE ONE CAPSULE BY MOUTH EVERY WEEK   potassium chloride SA (KLOR-CON M) 20 MEQ tablet Take 1 tablet (20 mEq total) by mouth daily for 7 days.   [DISCONTINUED] Budeson-Glycopyrrol-Formoterol (BREZTRI AEROSPHERE) 160-9-4.8 MCG/ACT AERO Inhale 2 puffs into the lungs in the morning and at bedtime. (Patient not taking: Reported on 04/01/2022)   No facility-administered encounter medications on file as of 04/01/2022.    Allergies as of  04/01/2022 - Review Complete 04/01/2022  Allergen Reaction Noted   Ace inhibitors Swelling 10/09/2011   Lisinopril Swelling    Valsartan Swelling    Aspirin Other (See Comments)    Penicillins  06/08/2007    Past Medical History:  Diagnosis Date   Blind left eye    Cataract of both eyes 01/18/2009   Right surgically repaired   COPD with emphysema ([facility]) 02/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   Depression 06/08/2007   Essential hypertension, benign 06/08/2007   Family history of breast cancer    GERD 01/18/2009   Osteoporosis 12/31/2012   Left femur had T score -2.5 on DEXA scan 02/26/2009.    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   Vitamin D deficiency     Past Surgical History:  Procedure Laterality Date   CATARACT EXTRACTION Right    COLONOSCOPY  2002   HIP ARTHROPLASTY Right    HIP CLOSED REDUCTION Right 04/08/2021   Procedure: CLOSED REDUCTION HIP;  Surgeon: Mcarthur Rossetti, MD;  Location: [facility];  Service: Orthopedics;  Laterality: Right;   HIP CLOSED REDUCTION Right 09/15/2021   Procedure: CLOSED REDUCTION HIP;  Surgeon: Melina Schools, MD;  Location: [facility];  Service: Orthopedics;  Laterality: Right;   TUBAL LIGATION      Family History  Problem Relation Age of Onset   Heart disease Mother    Breast cancer Mother 66   Stroke Father    Heart disease Sister    Asthma Daughter    Cancer Other    Diabetes Other    Hypertension Other    COPD Brother        "9/11 survivor"   COPD Sister        "9/11 survivor"   Breast cancer Maternal Grandmother        dx in her 62s   Breast cancer Other        MGMs mother died in her 85s   Colon cancer Neg Hx     Social History   Socioeconomic History   Marital status: Widowed    Spouse name: Not on file   Number of children: Not on file   Years of education: Not  on file   Highest education level: Not on file  Occupational History   Not on file  Tobacco Use   Smoking status: Former    Packs/day: 0.25    Years: 30.00    Total pack years: 7.50    Types: Cigarettes    Quit date: 05/11/2010    Years since quitting: 11.8   Smokeless tobacco: Never  Vaping Use   Vaping Use: Never used  Substance and Sexual Activity   Alcohol use: Yes    Alcohol/week: 0.0 standard drinks of alcohol    Comment: wine cooler.   Drug use: No   Sexual  activity: Not on file  Other Topics Concern   Not on file  Social History Narrative   Married with 4 children. Retired, previously worked as a Scientist, clinical (histocompatibility and immunogenetics).   Social Determinants of Health   Financial Resource Strain: Not on file  Food Insecurity: Not on file  Transportation Needs: Not on file  Physical Activity: Not on file  Stress: Not on file  Social Connections: Not on file  Intimate Partner Violence: Not on file    Review of Systems  Constitutional:  Positive for appetite change and unexpected weight change.  HENT: Negative.    Eyes: Negative.   Respiratory:  Positive for cough and shortness of breath.   Cardiovascular:  Positive for leg swelling.  Gastrointestinal: Negative.   Endocrine: Negative.   Genitourinary: Negative.   Musculoskeletal: Negative.     There were no vitals filed for this visit.   Physical Exam Constitutional:      Appearance: She is well-developed. She is not diaphoretic.  HENT:     Head: Normocephalic and atraumatic.  Eyes:     General:        Right eye: No discharge.        Left eye: No discharge.  Neck:     Thyroid: No thyromegaly.     Trachea: No tracheal deviation.  Cardiovascular:     Rate and Rhythm: Normal rate and regular rhythm.     Heart sounds: No murmur heard.    No friction rub.  Pulmonary:     Effort: Pulmonary effort is normal. No respiratory distress.     Breath sounds: No stridor. Rhonchi present. No wheezing.  Musculoskeletal:      Cervical back: No rigidity or tenderness.  Neurological:     Mental Status: She is alert.     Deep Tendon Reflexes: Reflexes are normal and symmetric.  Psychiatric:        Mood and Affect: Mood normal.    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  Plan/Recommendations:  Continue bronchodilator treatments  Could not afford Breztri -Was on Wixela prior to that -Prescription for [employer] provided  Continue nebulization therapy as tolerated Course of steroids will be ordered 20 mg daily for 14 days then 10 mg daily for 7 days Prescription for Biaxin for 7 days  Follow-up in about 4 to 6 weeks  Continue oxygen supplementation  I spent 30 minutes dedicated to the care of this patient on the date of this encounter to include previsit review of records, face-to-face time with the patient discussing conditions above, post visit ordering of testing, clinical documentation with electronic health record, making appropriate referrals as documented, and communicated necessary findings to members of the patient's care team   Sherrilyn Rist MD [facility] 04/01/2022, 4:23 PM  CC: Isaac Bliss, [place]*

## 2022-04-02 ENCOUNTER — Other Ambulatory Visit: Payer: Self-pay | Admitting: Internal Medicine

## 2022-04-02 DIAGNOSIS — J432 Centrilobular emphysema: Secondary | ICD-10-CM

## 2022-04-02 DIAGNOSIS — J439 Emphysema, unspecified: Secondary | ICD-10-CM

## 2022-04-03 ENCOUNTER — Telehealth: Payer: Self-pay | Admitting: Pulmonary Disease

## 2022-04-04 NOTE — Telephone Encounter (Signed)
Called and spoke with pt's sister who states a form from [employer] was to be faxed over to the office to be filled out and signed by Dr. Jenetta Downer. Stated to pt's sister that we would check to see if form had been received and stated to her that we would call her back once we knew form had been taken care of and she verbalized understanding.   Vallarie Mare, please advise if you ever received a form from [employer] on pt and if so, has form been taken care of yet by Dr. Jenetta Downer?

## 2022-04-10 DIAGNOSIS — J449 Chronic obstructive pulmonary disease, unspecified: Secondary | ICD-10-CM | POA: Diagnosis not present

## 2022-04-14 ENCOUNTER — Emergency Department (HOSPITAL_COMMUNITY): Payer: Medicare Other

## 2022-04-14 ENCOUNTER — Inpatient Hospital Stay (HOSPITAL_COMMUNITY): Payer: Medicare Other

## 2022-04-14 ENCOUNTER — Encounter (HOSPITAL_COMMUNITY): Payer: Self-pay

## 2022-04-14 ENCOUNTER — Other Ambulatory Visit: Payer: Self-pay

## 2022-04-14 ENCOUNTER — Inpatient Hospital Stay (HOSPITAL_COMMUNITY)
Admission: EM | Admit: 2022-04-14 | Discharge: 2022-04-17 | DRG: 871 | Disposition: A | Discharge status: Home Health | Payer: Medicare Other | Attending: Family Medicine | Admitting: Family Medicine

## 2022-04-14 DIAGNOSIS — E860 Dehydration: Secondary | ICD-10-CM

## 2022-04-14 DIAGNOSIS — J439 Emphysema, unspecified: Secondary | ICD-10-CM | POA: Diagnosis not present

## 2022-04-14 DIAGNOSIS — R000 Tachycardia, unspecified: Secondary | ICD-10-CM | POA: Diagnosis not present

## 2022-04-14 DIAGNOSIS — Z20822 Contact with and (suspected) exposure to covid-19: Secondary | ICD-10-CM | POA: Diagnosis not present

## 2022-04-14 DIAGNOSIS — A419 Sepsis, unspecified organism: Secondary | ICD-10-CM | POA: Diagnosis not present

## 2022-04-14 DIAGNOSIS — E876 Hypokalemia: Secondary | ICD-10-CM | POA: Diagnosis not present

## 2022-04-14 DIAGNOSIS — Z789 Other specified health status: Secondary | ICD-10-CM

## 2022-04-14 DIAGNOSIS — E785 Hyperlipidemia, unspecified: Secondary | ICD-10-CM | POA: Diagnosis present

## 2022-04-14 DIAGNOSIS — E559 Vitamin D deficiency, unspecified: Secondary | ICD-10-CM | POA: Diagnosis present

## 2022-04-14 DIAGNOSIS — K219 Gastro-esophageal reflux disease without esophagitis: Secondary | ICD-10-CM | POA: Diagnosis present

## 2022-04-14 DIAGNOSIS — Z7952 Long term (current) use of systemic steroids: Secondary | ICD-10-CM

## 2022-04-14 DIAGNOSIS — E162 Hypoglycemia, unspecified: Secondary | ICD-10-CM | POA: Diagnosis not present

## 2022-04-14 DIAGNOSIS — E871 Hypo-osmolality and hyponatremia: Secondary | ICD-10-CM | POA: Diagnosis present

## 2022-04-14 DIAGNOSIS — Z743 Need for continuous supervision: Secondary | ICD-10-CM | POA: Diagnosis not present

## 2022-04-14 DIAGNOSIS — J9611 Chronic respiratory failure with hypoxia: Secondary | ICD-10-CM | POA: Diagnosis not present

## 2022-04-14 DIAGNOSIS — Z87891 Personal history of nicotine dependence: Secondary | ICD-10-CM

## 2022-04-14 DIAGNOSIS — R404 Transient alteration of awareness: Secondary | ICD-10-CM | POA: Diagnosis not present

## 2022-04-14 DIAGNOSIS — Z825 Family history of asthma and other chronic lower respiratory diseases: Secondary | ICD-10-CM

## 2022-04-14 DIAGNOSIS — H5462 Unqualified visual loss, left eye, normal vision right eye: Secondary | ICD-10-CM | POA: Diagnosis present

## 2022-04-14 DIAGNOSIS — Z79899 Other long term (current) drug therapy: Secondary | ICD-10-CM

## 2022-04-14 DIAGNOSIS — R627 Adult failure to thrive: Principal | ICD-10-CM

## 2022-04-14 DIAGNOSIS — F10139 Alcohol abuse with withdrawal, unspecified: Secondary | ICD-10-CM | POA: Diagnosis present

## 2022-04-14 DIAGNOSIS — Z9981 Dependence on supplemental oxygen: Secondary | ICD-10-CM | POA: Diagnosis not present

## 2022-04-14 DIAGNOSIS — Z888 Allergy status to other drugs, medicaments and biological substances status: Secondary | ICD-10-CM

## 2022-04-14 DIAGNOSIS — G9340 Encephalopathy, unspecified: Secondary | ICD-10-CM | POA: Diagnosis present

## 2022-04-14 DIAGNOSIS — G8929 Other chronic pain: Secondary | ICD-10-CM | POA: Diagnosis present

## 2022-04-14 DIAGNOSIS — R059 Cough, unspecified: Secondary | ICD-10-CM | POA: Diagnosis not present

## 2022-04-14 DIAGNOSIS — I10 Essential (primary) hypertension: Secondary | ICD-10-CM | POA: Diagnosis present

## 2022-04-14 DIAGNOSIS — R6520 Severe sepsis without septic shock: Secondary | ICD-10-CM | POA: Diagnosis not present

## 2022-04-14 DIAGNOSIS — F1090 Alcohol use, unspecified, uncomplicated: Secondary | ICD-10-CM

## 2022-04-14 DIAGNOSIS — J690 Pneumonitis due to inhalation of food and vomit: Secondary | ICD-10-CM | POA: Diagnosis present

## 2022-04-14 DIAGNOSIS — J90 Pleural effusion, not elsewhere classified: Secondary | ICD-10-CM | POA: Diagnosis not present

## 2022-04-14 DIAGNOSIS — D869 Sarcoidosis, unspecified: Secondary | ICD-10-CM | POA: Diagnosis present

## 2022-04-14 DIAGNOSIS — E43 Unspecified severe protein-calorie malnutrition: Secondary | ICD-10-CM | POA: Diagnosis present

## 2022-04-14 DIAGNOSIS — Z886 Allergy status to analgesic agent status: Secondary | ICD-10-CM

## 2022-04-14 DIAGNOSIS — E875 Hyperkalemia: Secondary | ICD-10-CM

## 2022-04-14 DIAGNOSIS — G9341 Metabolic encephalopathy: Secondary | ICD-10-CM | POA: Diagnosis not present

## 2022-04-14 DIAGNOSIS — I471 Supraventricular tachycardia, unspecified: Secondary | ICD-10-CM

## 2022-04-14 DIAGNOSIS — M810 Age-related osteoporosis without current pathological fracture: Secondary | ICD-10-CM | POA: Diagnosis present

## 2022-04-14 DIAGNOSIS — E440 Moderate protein-calorie malnutrition: Secondary | ICD-10-CM | POA: Insufficient documentation

## 2022-04-14 DIAGNOSIS — J441 Chronic obstructive pulmonary disease with (acute) exacerbation: Secondary | ICD-10-CM

## 2022-04-14 DIAGNOSIS — Z681 Body mass index (BMI) 19 or less, adult: Secondary | ICD-10-CM

## 2022-04-14 DIAGNOSIS — Z8249 Family history of ischemic heart disease and other diseases of the circulatory system: Secondary | ICD-10-CM | POA: Diagnosis not present

## 2022-04-14 DIAGNOSIS — R0602 Shortness of breath: Secondary | ICD-10-CM | POA: Diagnosis not present

## 2022-04-14 DIAGNOSIS — F32A Depression, unspecified: Secondary | ICD-10-CM | POA: Diagnosis not present

## 2022-04-14 DIAGNOSIS — J8410 Pulmonary fibrosis, unspecified: Secondary | ICD-10-CM | POA: Diagnosis not present

## 2022-04-14 DIAGNOSIS — Z880 Allergy status to penicillin: Secondary | ICD-10-CM

## 2022-04-14 DIAGNOSIS — Z833 Family history of diabetes mellitus: Secondary | ICD-10-CM

## 2022-04-14 DIAGNOSIS — R29898 Other symptoms and signs involving the musculoskeletal system: Secondary | ICD-10-CM

## 2022-04-14 DIAGNOSIS — Z634 Disappearance and death of family member: Secondary | ICD-10-CM

## 2022-04-14 DIAGNOSIS — F10129 Alcohol abuse with intoxication, unspecified: Secondary | ICD-10-CM

## 2022-04-14 DIAGNOSIS — E161 Other hypoglycemia: Secondary | ICD-10-CM | POA: Diagnosis not present

## 2022-04-14 DIAGNOSIS — Z96641 Presence of right artificial hip joint: Secondary | ICD-10-CM | POA: Diagnosis present

## 2022-04-14 LAB — URINALYSIS, ROUTINE W REFLEX MICROSCOPIC
Bilirubin Urine: NEGATIVE
Glucose, UA: 50 mg/dL — AB
Hgb urine dipstick: NEGATIVE
Ketones, ur: 5 mg/dL — AB
Leukocytes,Ua: NEGATIVE
Nitrite: NEGATIVE
Protein, ur: NEGATIVE mg/dL
Specific Gravity, Urine: 1.016 (ref 1.005–1.030)
pH: 5 (ref 5.0–8.0)

## 2022-04-14 LAB — CBG MONITORING, ED
Glucose-Capillary: 135 mg/dL — ABNORMAL HIGH (ref 70–99)
Glucose-Capillary: 57 mg/dL — ABNORMAL LOW (ref 70–99)
Glucose-Capillary: 84 mg/dL (ref 70–99)
Glucose-Capillary: 60 mg/dL — ABNORMAL LOW (ref 70–99)
Glucose-Capillary: 76 mg/dL (ref 70–99)

## 2022-04-14 LAB — GLUCOSE, CAPILLARY
Glucose-Capillary: 63 mg/dL — ABNORMAL LOW (ref 70–99)
Glucose-Capillary: 142 mg/dL — ABNORMAL HIGH (ref 70–99)
Glucose-Capillary: 150 mg/dL — ABNORMAL HIGH (ref 70–99)
Glucose-Capillary: 220 mg/dL — ABNORMAL HIGH (ref 70–99)
Glucose-Capillary: 176 mg/dL — ABNORMAL HIGH (ref 70–99)

## 2022-04-14 LAB — COMPREHENSIVE METABOLIC PANEL
ALT: 24 U/L (ref 0–44)
AST: 51 U/L — ABNORMAL HIGH (ref 15–41)
Albumin: 3 g/dL — ABNORMAL LOW (ref 3.5–5.0)
Alkaline Phosphatase: 94 U/L (ref 38–126)
Anion gap: 13 (ref 5–15)
BUN: 20 mg/dL (ref 8–23)
CO2: 25 mmol/L (ref 22–32)
Calcium: 8.4 mg/dL — ABNORMAL LOW (ref 8.9–10.3)
Chloride: 101 mmol/L (ref 98–111)
Creatinine, Ser: 0.77 mg/dL (ref 0.44–1.00)
GFR, Estimated: >60 mL/min (ref >60)
Glucose, Bld: 79 mg/dL (ref 70–99)
Potassium: 5.5 mmol/L — ABNORMAL HIGH (ref 3.5–5.1)
Sodium: 139 mmol/L (ref 135–145)
Total Bilirubin: 0.8 mg/dL (ref 0.3–1.2)
Total Protein: 7.5 g/dL (ref 6.5–8.1)

## 2022-04-14 LAB — RESP PANEL BY RT-PCR (FLU A&B, COVID) ARPGX2
Influenza A by PCR: NEGATIVE
Influenza B by PCR: NEGATIVE
SARS Coronavirus 2 by RT PCR: NEGATIVE

## 2022-04-14 LAB — CBC WITH DIFFERENTIAL/PLATELET
Abs Immature Granulocytes: 0.06 10*3/uL (ref 0.00–0.07)
Basophils Absolute: 0 10*3/uL (ref 0.0–0.1)
Basophils Relative: 0 %
Eosinophils Absolute: 0 10*3/uL (ref 0.0–0.5)
Eosinophils Relative: 0 %
HCT: 36.5 % (ref 36.0–46.0)
Hemoglobin: 11.7 g/dL — ABNORMAL LOW (ref 12.0–15.0)
Immature Granulocytes: 1 %
Lymphocytes Relative: 7 %
Lymphs Abs: 0.9 10*3/uL (ref 0.7–4.0)
MCH: 27.3 pg (ref 26.0–34.0)
MCHC: 32.1 g/dL (ref 30.0–36.0)
MCV: 85.3 fL (ref 80.0–100.0)
Monocytes Absolute: 0.5 10*3/uL (ref 0.1–1.0)
Monocytes Relative: 4 %
Neutro Abs: 11 10*3/uL — ABNORMAL HIGH (ref 1.7–7.7)
Neutrophils Relative %: 88 %
Platelets: 292 10*3/uL (ref 150–400)
RBC: 4.28 MIL/uL (ref 3.87–5.11)
RDW: 18.5 % — ABNORMAL HIGH (ref 11.5–15.5)
WBC: 12.5 10*3/uL — ABNORMAL HIGH (ref 4.0–10.5)
nRBC: 0 % (ref 0.0–0.2)

## 2022-04-14 LAB — RESPIRATORY PANEL BY PCR

## 2022-04-14 LAB — PROCALCITONIN: Procalcitonin: 2.03 ng/mL

## 2022-04-14 LAB — LACTIC ACID, PLASMA
Lactic Acid, Venous: 3.2 mmol/L (ref 0.5–1.9)
Lactic Acid, Venous: 2.4 mmol/L (ref 0.5–1.9)
Lactic Acid, Venous: 1.7 mmol/L (ref 0.5–1.9)

## 2022-04-14 LAB — ETHANOL: Alcohol, Ethyl (B): 112 mg/dL — ABNORMAL HIGH (ref <10)

## 2022-04-14 LAB — MRSA NEXT GEN BY PCR, NASAL: MRSA by PCR Next Gen: NOT DETECTED

## 2022-04-14 MED ORDER — THIAMINE HCL 100 MG/ML IJ SOLN: 100.0000 mg | Freq: Every day | INTRAMUSCULAR | Status: DC

## 2022-04-14 MED ORDER — ACETAMINOPHEN 650 MG RE SUPP: 650.0000 mg | Freq: Four times a day (QID) | RECTAL | Status: DC | PRN

## 2022-04-14 MED ORDER — METHYLPREDNISOLONE SODIUM SUCC 125 MG IJ SOLR
125.0000 mg | Freq: Once | INTRAMUSCULAR | Status: AC
  Administered 2022-04-14: 125 mg via INTRAVENOUS
  Filled 2022-04-14: qty 2

## 2022-04-14 MED ORDER — GUAIFENESIN ER 600 MG PO TB12
600.0000 mg | ORAL_TABLET | Freq: Two times a day (BID) | ORAL | Status: DC
  Administered 2022-04-14 – 2022-04-17 (×7): 600 mg via ORAL
  Filled 2022-04-14 (×7): qty 1

## 2022-04-14 MED ORDER — LORAZEPAM 1 MG PO TABS
1.0000 mg | ORAL_TABLET | ORAL | Status: DC | PRN
  Administered 2022-04-14 – 2022-04-15 (×2): 1 mg via ORAL
  Filled 2022-04-14 (×2): qty 1

## 2022-04-14 MED ORDER — MIRTAZAPINE 15 MG PO TABS: 15.0000 mg | ORAL_TABLET | Freq: Every day | ORAL | Status: DC

## 2022-04-14 MED ORDER — LORATADINE 10 MG PO TABS
10.0000 mg | ORAL_TABLET | Freq: Every day | ORAL | Status: DC
  Administered 2022-04-14 – 2022-04-17 (×4): 10 mg via ORAL
  Filled 2022-04-14 (×4): qty 1

## 2022-04-14 MED ORDER — AMLODIPINE BESYLATE 5 MG PO TABS
5.0000 mg | ORAL_TABLET | Freq: Every day | ORAL | Status: DC
  Administered 2022-04-15 – 2022-04-17 (×3): 5 mg via ORAL
  Filled 2022-04-14 (×3): qty 1

## 2022-04-14 MED ORDER — SODIUM ZIRCONIUM CYCLOSILICATE 5 G PO PACK
5.0000 g | PACK | Freq: Once | ORAL | Status: AC
  Administered 2022-04-14: 5 g via ORAL
  Filled 2022-04-14: qty 1

## 2022-04-14 MED ORDER — ACETAMINOPHEN 325 MG PO TABS
650.0000 mg | ORAL_TABLET | Freq: Four times a day (QID) | ORAL | Status: DC | PRN
  Administered 2022-04-14 – 2022-04-16 (×2): 650 mg via ORAL
  Filled 2022-04-14 (×2): qty 2

## 2022-04-14 MED ORDER — ENOXAPARIN SODIUM 30 MG/0.3ML IJ SOSY
30.0000 mg | PREFILLED_SYRINGE | INTRAMUSCULAR | Status: DC
  Administered 2022-04-14 – 2022-04-16 (×3): 30 mg via SUBCUTANEOUS
  Filled 2022-04-14 (×3): qty 0.3

## 2022-04-14 MED ORDER — IOHEXOL 350 MG/ML SOLN
75.0000 mL | Freq: Once | INTRAVENOUS | Status: AC | PRN
  Administered 2022-04-14: 75 mL via INTRAVENOUS

## 2022-04-14 MED ORDER — FOLIC ACID 1 MG PO TABS
1.0000 mg | ORAL_TABLET | Freq: Every day | ORAL | Status: DC
  Administered 2022-04-14 – 2022-04-17 (×4): 1 mg via ORAL
  Filled 2022-04-14 (×4): qty 1

## 2022-04-14 MED ORDER — VANCOMYCIN HCL 1250 MG/250ML IV SOLN
1250.0000 mg | INTRAVENOUS | Status: DC
  Administered 2022-04-14: 1250 mg via INTRAVENOUS
  Filled 2022-04-14: qty 250

## 2022-04-14 MED ORDER — ADULT MULTIVITAMIN W/MINERALS CH
1.0000 | ORAL_TABLET | Freq: Every day | ORAL | Status: DC
  Administered 2022-04-14 – 2022-04-17 (×4): 1 via ORAL
  Filled 2022-04-14 (×4): qty 1

## 2022-04-14 MED ORDER — PANTOPRAZOLE SODIUM 40 MG PO TBEC
40.0000 mg | DELAYED_RELEASE_TABLET | Freq: Every day | ORAL | Status: DC
  Administered 2022-04-14 – 2022-04-17 (×4): 40 mg via ORAL
  Filled 2022-04-14 (×4): qty 1

## 2022-04-14 MED ORDER — CHLORHEXIDINE GLUCONATE CLOTH 2 % EX PADS
6.0000 | MEDICATED_PAD | Freq: Every day | CUTANEOUS | Status: DC
  Administered 2022-04-14 – 2022-04-15 (×2): 6 via TOPICAL

## 2022-04-14 MED ORDER — THIAMINE MONONITRATE 100 MG PO TABS
100.0000 mg | ORAL_TABLET | Freq: Every day | ORAL | Status: DC
  Administered 2022-04-15 – 2022-04-17 (×3): 100 mg via ORAL
  Filled 2022-04-14 (×3): qty 1

## 2022-04-14 MED ORDER — METRONIDAZOLE 500 MG/100ML IV SOLN
500.0000 mg | Freq: Two times a day (BID) | INTRAVENOUS | Status: DC
  Administered 2022-04-14 – 2022-04-16 (×4): 500 mg via INTRAVENOUS
  Filled 2022-04-14 (×3): qty 100

## 2022-04-14 MED ORDER — SODIUM CHLORIDE 0.9 % IV BOLUS
1000.0000 mL | Freq: Once | INTRAVENOUS | Status: AC
  Administered 2022-04-14: 1000 mL via INTRAVENOUS

## 2022-04-14 MED ORDER — THIAMINE HCL 100 MG/ML IJ SOLN
500.0000 mg | Freq: Once | INTRAMUSCULAR | Status: DC
  Filled 2022-04-14: qty 6

## 2022-04-14 MED ORDER — DEXTROSE 50 % IV SOLN
25.0000 mL | Freq: Once | INTRAVENOUS | Status: AC
  Administered 2022-04-14: 25 mL via INTRAVENOUS
  Filled 2022-04-14: qty 50

## 2022-04-14 MED ORDER — THIAMINE HCL 100 MG/ML IJ SOLN
500.0000 mg | Freq: Once | INTRAVENOUS | Status: AC
  Administered 2022-04-14: 500 mg via INTRAVENOUS
  Filled 2022-04-14 (×2): qty 5

## 2022-04-14 MED ORDER — IPRATROPIUM-ALBUTEROL 0.5-2.5 (3) MG/3ML IN SOLN
3.0000 mL | Freq: Once | RESPIRATORY_TRACT | Status: AC
  Administered 2022-04-14: 3 mL via RESPIRATORY_TRACT
  Filled 2022-04-14: qty 3

## 2022-04-14 MED ORDER — DEXTROSE-NACL 5-0.9 % IV SOLN: INTRAVENOUS | Status: DC

## 2022-04-14 MED ORDER — SODIUM CHLORIDE 0.9 % IV SOLN
2.0000 g | Freq: Two times a day (BID) | INTRAVENOUS | Status: DC
  Administered 2022-04-15: 2 g via INTRAVENOUS
  Filled 2022-04-14: qty 12.5

## 2022-04-14 MED ORDER — VANCOMYCIN HCL IN DEXTROSE 1-5 GM/200ML-% IV SOLN: 1000.0000 mg | Freq: Once | INTRAVENOUS | Status: DC

## 2022-04-14 MED ORDER — LORAZEPAM 2 MG/ML IJ SOLN
1.0000 mg | INTRAMUSCULAR | Status: DC | PRN
  Administered 2022-04-15 (×2): 2 mg via INTRAVENOUS
  Filled 2022-04-14 (×2): qty 1

## 2022-04-14 MED ORDER — ORAL CARE MOUTH RINSE: 15.0000 mL | OROMUCOSAL | Status: DC | PRN

## 2022-04-14 MED ORDER — GABAPENTIN 100 MG PO CAPS
100.0000 mg | ORAL_CAPSULE | Freq: Three times a day (TID) | ORAL | Status: DC
  Administered 2022-04-14 – 2022-04-17 (×8): 100 mg via ORAL
  Filled 2022-04-14 (×8): qty 1

## 2022-04-14 MED ORDER — SODIUM CHLORIDE 0.9 % IV SOLN: 2.0000 g | Freq: Two times a day (BID) | INTRAVENOUS | Status: DC

## 2022-04-14 MED ORDER — METOPROLOL SUCCINATE ER 50 MG PO TB24
50.0000 mg | ORAL_TABLET | Freq: Every day | ORAL | Status: DC
  Administered 2022-04-15 – 2022-04-17 (×3): 50 mg via ORAL
  Filled 2022-04-14: qty 1
  Filled 2022-04-14 (×2): qty 2

## 2022-04-14 MED ORDER — SODIUM CHLORIDE 0.9 % IV SOLN
2.0000 g | Freq: Once | INTRAVENOUS | Status: AC
  Administered 2022-04-14: 2 g via INTRAVENOUS
  Filled 2022-04-14: qty 12.5

## 2022-04-14 MED ORDER — PRAVASTATIN SODIUM 40 MG PO TABS
40.0000 mg | ORAL_TABLET | Freq: Every evening | ORAL | Status: DC
  Administered 2022-04-14 – 2022-04-16 (×2): 40 mg via ORAL
  Filled 2022-04-14: qty 2
  Filled 2022-04-14: qty 1
  Filled 2022-04-14: qty 2

## 2022-04-14 MED ORDER — PREDNISONE 20 MG PO TABS
40.0000 mg | ORAL_TABLET | Freq: Every day | ORAL | Status: DC
Start: 2022-04-15 — End: 2022-04-17
  Administered 2022-04-15 – 2022-04-17 (×3): 40 mg via ORAL
  Filled 2022-04-14 (×3): qty 2

## 2022-04-14 MED ORDER — IPRATROPIUM-ALBUTEROL 0.5-2.5 (3) MG/3ML IN SOLN
3.0000 mL | Freq: Four times a day (QID) | RESPIRATORY_TRACT | Status: DC
  Administered 2022-04-14 – 2022-04-15 (×2): 3 mL via RESPIRATORY_TRACT
  Filled 2022-04-14 (×3): qty 3

## 2022-04-14 NOTE — ED Notes (Signed)
Pt's HR=[id number]. Dr. Ralene Bathe at bedside. Pt denies complaints, EKG obtained.

## 2022-04-14 NOTE — ED Provider Notes (Signed)
[facility] DEPT Provider Note   CSN: [id number] Arrival date & time: 04/14/22  7169     History  Chief Complaint  Patient presents with   Altered Mental Status    [name] is a 76 y.o. female.  The history is provided by the patient, the EMS personnel and medical records.  Altered Mental Status [name] is a 76 y.o. female who presents to the Emergency Department complaining of AMS.  She presents to the ED by EMS for evaluation of AMS.  EMS was called due to unresponsive.  Blood glucose read low and she was treated with D10.  She has been drinking wine coolers since yesterday.    Has chronic pain in the left leg.  She states she does not eat well due to poor appetite and bad teeth.  She has been losing weight.  No reports of fevers.  Patient states she did have some tequila and wine cooler yesterday but not very much.  She drinks a small amount every night to help her sleep.   She uses chronic oxygen - 2.5L.    Lives with daughter.      Home Medications Prior to Admission medications   Medication Sig Start Date End Date Taking? Authorizing Provider  albuterol (PROVENTIL) (2.5 MG/3ML) 0.083% nebulizer solution MINI-NEB TREATMENT: USE ONE VIAL VIA NEBULIZER EVERY 6 HOURS AS NEEDED FOR WHEEZING Patient taking differently: Take 2.5 mg by nebulization every 6 (six) hours as needed for wheezing. 04/02/22  Yes Isaac Bliss, Rayford Halsted, MD  albuterol (VENTOLIN HFA) 108 (90 Base) MCG/ACT inhaler INHALE TWO PUFFS EVERY 6 HOURS AS NEEDED FOR WHEEZING Patient taking differently: Inhale 2 puffs into the lungs every 6 (six) hours as needed for wheezing. INHALE TWO PUFFS EVERY 6 HOURS AS NEEDED FOR WHEEZING 04/02/22  Yes Isaac Bliss, Rayford Halsted, MD  amLODipine (NORVASC) 5 MG tablet Take 1 tablet (5 mg total) by mouth daily. 03/24/22  Yes Isaac Bliss, Rayford Halsted, MD  Budeson-Glycopyrrol-Formoterol (BREZTRI AEROSPHERE) 160-9-4.8 MCG/ACT AERO  Inhale 2 puffs into the lungs in the morning and at bedtime. 04/01/22  Yes Olalere, Adewale A, MD  gabapentin (NEURONTIN) 100 MG capsule TAKE ONE TABLET BY MOUTH THREE TIMES DAILY Patient taking differently: Take 100 mg by mouth 3 (three) times daily. 03/24/22  Yes Isaac Bliss, Rayford Halsted, MD  hydrochlorothiazide (HYDRODIURIL) 25 MG tablet TAKE ONE-HALF TO 1 TABLET BY MOUTH DAILY AS NEEDED FOR SWELLING Patient taking differently: Take 12.5 mg by mouth daily as needed (for swelling). 03/24/22  Yes Isaac Bliss, Rayford Halsted, MD  loratadine (CLARITIN) 10 MG tablet Take 1 tablet (10 mg total) by mouth daily. 10/22/21  Yes Isaac Bliss, Rayford Halsted, MD  Magnesium 500 MG TABS Take 1 tablet (500 mg total) by mouth at bedtime. 11/07/20  Yes Isaac Bliss, Rayford Halsted, MD  melatonin 5 MG TABS Take 2 tablets (10 mg total) by mouth at bedtime. 04/01/22  Yes Olalere, Adewale A, MD  metoprolol succinate (TOPROL-XL) 50 MG 24 hr tablet TAKE ONE TABLET BY MOUTH DAILY WITH OR IMMEDIATELY FOLLOWING A MEAL Patient taking differently: Take 50 mg by mouth daily. TAKE ONE TABLET BY MOUTH DAILY WITH OR IMMEDIATELY FOLLOWING A MEAL 01/01/22  Yes Isaac Bliss, Rayford Halsted, MD  mirtazapine (REMERON) 15 MG tablet TAKE ONE TABLET BY MOUTH AT BEDTIME Patient taking differently: Take 15 mg by mouth at bedtime. 11/28/21  Yes Isaac Bliss, Rayford Halsted, MD  pantoprazole (PROTONIX) 40 MG tablet Take 1  tablet (40 mg total) by mouth daily. 02/05/22  Yes Isaac Bliss, Rayford Halsted, MD  pravastatin (PRAVACHOL) 40 MG tablet Take 1 tablet (40 mg total) by mouth every evening. 02/06/22  Yes Isaac Bliss, Rayford Halsted, MD  predniSONE (DELTASONE) 20 MG tablet Take 0.5 tablets (10 mg total) by mouth daily with breakfast. 04/01/22  Yes Olalere, Adewale A, MD  acetaminophen (TYLENOL) 500 MG tablet Take 1,000 mg by mouth every 6 (six) hours as needed for moderate pain. Patient not taking: Reported on 04/14/2022    [provider]   clarithromycin (BIAXIN) 250 MG tablet Take 1 tablet (250 mg total) by mouth 2 (two) times daily. Patient not taking: Reported on 04/14/2022 04/01/22   Laurin Coder, MD  cyanocobalamin (,VITAMIN B-12,) 1000 MCG/ML injection Inject 27m in deltoid once weekly for 4 weeks, then inject 1 ml once a month thereafter Patient not taking: Reported on 04/14/2022 10/23/21   HIsaac Bliss ERayford Halsted MD  famotidine (PEPCID) 20 MG tablet TAKE ONE TABLET BY MOUTH TWICE DAILY Patient not taking: Reported on 04/14/2022 01/01/22   HIsaac Bliss ERayford Halsted MD  fluticasone-salmeterol ([facility]) 500-50 MCG/ACT AEPB Inhale 1 puff into the lungs in the morning and at bedtime. Patient not taking: Reported on 04/14/2022 04/01/22   OLaurin Coder MD  furosemide (LASIX) 20 MG tablet TAKE ONE TABLET BY MOUTH DAILY Patient not taking: Reported on 04/14/2022 01/01/22   HIsaac Bliss ERayford Halsted MD  potassium citrate (UROCIT-K) 10 MEQ (1080 MG) SR tablet TAKE ONE TABLET BY MOUTH EVERY DAY Patient not taking: Reported on 04/14/2022 07/16/21   HIsaac Bliss ERayford Halsted MD  SYRINGE-NEEDLE, DISP, 3 ML (BD SAFETYGLIDE SYRINGE/NEEDLE) 25G X 1" 3 ML MISC Use for B12 injections 10/23/21   HIsaac Bliss ERayford Halsted MD  Vitamin D, Ergocalciferol, (DRISDOL) 1.25 MG (50000 UNIT) CAPS capsule TAKE ONE CAPSULE BY MOUTH EVERY WEEK Patient not taking: Reported on 04/14/2022 05/14/21   HIsaac Bliss ERayford Halsted MD      Allergies    Ace inhibitors, Lisinopril, Valsartan, Aspirin, and Penicillins    Review of Systems   Review of Systems  All other systems reviewed and are negative.   Physical Exam Updated Vital Signs BP (!) 102/59   Pulse 100   Temp 99.3 F (37.4 C) (Oral)   Resp (!) 21   Ht '5\' 3"'$  (1.6 m)   Wt 45.4 kg   SpO2 96%   BMI 17.71 kg/m  Physical Exam Vitals and nursing note reviewed.  Constitutional:      Appearance: She is well-developed.  HENT:     Head: Normocephalic and atraumatic.  Cardiovascular:      Rate and Rhythm: Regular rhythm. Tachycardia present.     Heart sounds: No murmur heard. Pulmonary:     Effort: Pulmonary effort is normal. No respiratory distress.     Comments: Occasional rhonchi Abdominal:     Palpations: Abdomen is soft.     Tenderness: There is no abdominal tenderness. There is no guarding or rebound.  Musculoskeletal:        General: No swelling or tenderness.  Skin:    General: Skin is warm and dry.  Neurological:     Mental Status: She is alert and oriented to person, place, and time.  Psychiatric:        Behavior: Behavior normal.     ED Results / Procedures / Treatments   Labs (all labs ordered are listed, but only abnormal results are displayed) Labs Reviewed  COMPREHENSIVE METABOLIC PANEL - Abnormal; Notable for the following components:      Result Value   Potassium 5.5 (*)    Calcium 8.4 (*)    Albumin 3.0 (*)    AST 51 (*)    All other components within normal limits  URINALYSIS, ROUTINE W REFLEX MICROSCOPIC - Abnormal; Notable for the following components:   Glucose, UA 50 (*)    Ketones, ur 5 (*)    All other components within normal limits  ETHANOL - Abnormal; Notable for the following components:   Alcohol, Ethyl (B) 112 (*)    All other components within normal limits  CBC WITH DIFFERENTIAL/PLATELET - Abnormal; Notable for the following components:   WBC 12.5 (*)    Hemoglobin 11.7 (*)    RDW 18.5 (*)    Neutro Abs 11.0 (*)    All other components within normal limits  LACTIC ACID, PLASMA - Abnormal; Notable for the following components:   Lactic Acid, Venous 3.2 (*)    All other components within normal limits  CBG MONITORING, ED - Abnormal; Notable for the following components:   Glucose-Capillary 135 (*)    All other components within normal limits  CBG MONITORING, ED - Abnormal; Notable for the following components:   Glucose-Capillary 57 (*)    All other components within normal limits  CBG MONITORING, ED - Abnormal;  Notable for the following components:   Glucose-Capillary 60 (*)    All other components within normal limits  RESP PANEL BY RT-PCR (FLU A&B, COVID) [id number]  CULTURE, BLOOD (ROUTINE X 2)  CULTURE, BLOOD (ROUTINE X 2)  LACTIC ACID, PLASMA  CBG MONITORING, ED    EKG EKG Interpretation  Date/Time:  Monday April 14 2022 06:56:23 EDT Ventricular Rate:  165 PR Interval:  137 QRS Duration: 83 QT Interval:  327 QTC Calculation: 542 R Axis:   -11 Text Interpretation: Supraventricular tachycardia Consider left ventricular hypertrophy Anterior Q waves, possibly due to LVH Nonspecific T abnormalities, lateral leads Confirmed by Quintella Reichert [phone]) on 04/14/2022 7:01:29 AM  Radiology DG Chest Port 1 View  Result Date: 04/14/2022 CLINICAL DATA:  Cough, shortness of breath.  History of sarcoidosis. EXAM: PORTABLE CHEST 1 VIEW COMPARISON:  04/08/2021, 12/10/2021. FINDINGS: The heart size and mediastinal contours are stable. Calcified lymph nodes are present in the mediastinal and hilar regions bilaterally. Stable apical pleural scarring and fibrotic changes are noted in the upper lobes bilaterally with retraction. Mild atelectasis or scarring is noted at the lung bases. No effusion or pneumothorax. No acute osseous abnormality. 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 No definite acute abnormality. Electronically Signed   By: Brett Fairy M.D.   On: 04/14/2022 04:51    Procedures Procedures    Medications Ordered in ED Medications  thiamine (VITAMIN B1) 500 mg in normal saline (50 mL) IVPB (has no administration in time range)  dextrose 50 % solution 25 mL (25 mLs Intravenous Given 04/14/22 0700)  sodium chloride 0.9 % bolus 1,000 mL (1,000 mLs Intravenous New Bag/Given 04/14/22 2426)    ED Course/ Medical Decision Making/ A&P                           Medical Decision Making Amount and/or Complexity of Data  Reviewed Labs: ordered. Radiology: ordered.  Risk Prescription drug management.   Patient here for evaluation following an unresponsive episode, found to have critical low  blood sugar treated with D10 prior to ED arrival.  Per report she had been drinking alcohol.  Patient states she drinks regularly but does not get intoxicated.  Patient hypothermic on ED arrival.  Initial suspicion for possible hypoglycemia induced hypothermia as no clear source of infection.  She does appear mildly dehydrated and she was treated with IV fluids.  Chest x-ray without acute infiltrate.  She did have recurrent hypoglycemia during her ED stay that initially improved with oral fluid and food intake.  She had a second episode that required D50.  Patient care transferred pending additional labs and imaging.        Final Clinical Impression(s) / ED Diagnoses Final diagnoses:  None    Rx / DC Orders ED Discharge Orders     None         Quintella Reichert, MD 04/14/22 [phone]

## 2022-04-14 NOTE — ED Triage Notes (Signed)
Pt to ED via [facility].  EMS was called out for unresponsive, when EMS arrived cbg was so low it would not register on their monitor. Pt was given 25gm D10, cbg=141.  Family informed EMS that pt has been drinking wine coolers since yesterday.  Pt alert on arrival, confused, some slurred speech noted.  Per EMS 20g LFA, 25gm D10  Repeat Cbg=141 HR 102 130/76 RR 22

## 2022-04-14 NOTE — ED Notes (Signed)
Bair hugger applied. Pt given food and drink.

## 2022-04-14 NOTE — ED Provider Notes (Signed)
Pt was signed out by Dr. Ralene Bathe at shift change.  She initially presented to the ED today with AMS.  She was hypoglycemic which improved with D50 and food.  She has been drinking alcohol and alcohol level 112 this am.  Pt was initially hypothermic with a temp of 94.  This has improved with bair hugger.  Pt did have an episode of SVT on the monitor with HR up to 160s.  This was captured with an EKG.  HR now back down to ST.  She has not been taking her meds.  She said she fell a few days ago.  Since then, she's been unable to walk.  Her daughter has been bring her food. She is unable to get up and walk now.  She is also sob and sob and HR worsens with any kind of movement.   Pt d/w Dr. Marylyn Ishihara ([facility]) for admission.   Isla Pence, MD 04/14/22 1158

## 2022-04-14 NOTE — ED Notes (Signed)
Pt to radiology.

## 2022-04-14 NOTE — Progress Notes (Signed)
Pharmacy Antibiotic Note  [name] is a 76 y.o. female admitted on 04/14/2022 with sepsis.  Pharmacy has been consulted for vancomycin and cefepime dosing.  Plan: Cefepime 2g IV q12h Vancomycin '1250mg'$  IV q48h for estimated AUC 479 using SCr rounded 0.8, total body weight 45.4kg, Vd 0.72 Check vancomycin levels at steady state, goal AUC 400-550 Follow up renal function & cultures  Height: '5\' 3"'$  (160 cm) Weight: 45.4 kg (100 lb) IBW/kg (Calculated) : 52.4  Temp (24hrs), Avg:96.8 F (36 C), Min:94 F (34.4 C), Max:99.3 F (37.4 C)  Recent Labs  Lab 04/14/22 0402 04/14/22 0700 04/14/22 0951  WBC 12.5*  --   --   CREATININE 0.77  --   --   LATICACIDVEN 3.2* 2.4* 1.7    Estimated Creatinine Clearance: 43.5 mL/min (by C-G formula based on SCr of 0.77 mg/dL).    Allergies  Allergen Reactions   Ace Inhibitors Swelling   Lisinopril Swelling    REACTION: Angioedema   Valsartan Swelling    REACTION: angioedema.  Pt should not get ACEI or ARBs of any kind due to angioedema.   Aspirin Other (See Comments)    REACTION: GI ulcer.   Penicillins     Childhood allergy Has patient had a PCN reaction causing immediate rash, facial/tongue/throat swelling, SOB or lightheadedness with hypotension: Unknown Has patient had a PCN reaction causing severe rash involving mucus membranes or skin necrosis: Unknown Has patient had a PCN reaction that required hospitalization: Unknown Has patient had a PCN reaction occurring within the last 10 years: Unknown If all of the above answers are "NO", then may proceed with Cephalosporin use.     Antimicrobials this admission:  9/4 Vanc >> 9/4 Cefepime >> 9/4 Flagyl >>  Dose adjustments this admission:   Microbiology results:  9/4 BCx: 9/4 MRSA PCR: 9/4 RVP:  Thank you for allowing pharmacy to be a part of this patient's care.  Peggyann Juba, PharmD, BCPS Pharmacy: [phone] 04/14/2022 1:00 PM

## 2022-04-14 NOTE — H&P (Addendum)
History and Physical    Patient: [name] WEX:[id number] DOB: 10/22/1945 DOA: 04/14/2022 DOS: the patient was seen and examined on 04/14/2022 PCP: Isaac Bliss, Rayford Halsted, MD  Patient coming from: Home  Chief Complaint:  Chief Complaint  Patient presents with   Altered Mental Status   HPI: [name] is a 76 y.o. female with medical history significant of COPD, chronic hypoxic respiratory failure, HTN, sarcoidosis. Presenting with altered mental status. History is per daughter by phone. She reports that her mother fell yesterday while trying to get to the bathroom. She didn't hit her head or pass out. She was assisted by her family back to her bed. She seemed ok at the time. However, it didn't appear that she are for the rest of the day. Her daughter checked on her last night and she appeared to be staring off into space. She was breathing very lightly and not responding to verbal command. Her daughter became concerned and called for EMS. She denies any other aggravating or alleviating factors.    Review of Systems: As mentioned in the history of present illness. All other systems reviewed and are negative. 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   Depression 06/08/2007   Essential hypertension, benign 06/08/2007   Family history of breast cancer    GERD 01/18/2009   Osteoporosis 12/31/2012   Left femur had T score -2.5 on DEXA scan 02/26/2009.    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   Vitamin D deficiency    Past Surgical History:  Procedure Laterality Date   CATARACT EXTRACTION Right    COLONOSCOPY  2002   HIP ARTHROPLASTY Right    HIP CLOSED REDUCTION Right 04/08/2021    Procedure: CLOSED REDUCTION HIP;  Surgeon: Mcarthur Rossetti, MD;  Location: [facility];  Service: Orthopedics;  Laterality: Right;   HIP CLOSED REDUCTION Right 09/15/2021   Procedure: CLOSED REDUCTION HIP;  Surgeon: Melina Schools, MD;  Location: [facility];  Service: Orthopedics;  Laterality: Right;   TUBAL LIGATION     Social History:  reports that she quit smoking about 11 years ago. Her smoking use included cigarettes. She has a 7.50 pack-year smoking history. She has never used smokeless tobacco. She reports current alcohol use. She reports that she does not use drugs.  Allergies  Allergen Reactions   Ace Inhibitors Swelling   Lisinopril Swelling    REACTION: Angioedema   Valsartan Swelling    REACTION: angioedema.  Pt should not get ACEI or ARBs of any kind due to angioedema.   Aspirin Other (See Comments)    REACTION: GI ulcer.   Penicillins     Childhood allergy Has patient had a PCN reaction causing immediate rash, facial/tongue/throat swelling, SOB or lightheadedness with hypotension: Unknown Has patient had a PCN reaction causing severe rash involving mucus membranes or skin necrosis: Unknown Has patient had a PCN reaction that required hospitalization: Unknown Has patient had a PCN reaction occurring within the last 10 years: Unknown If all of the above answers are "NO", then may proceed with Cephalosporin use.     Family History  Problem Relation Age of Onset  Heart disease Mother    Breast cancer Mother 53   Stroke Father    Heart disease Sister    Asthma Daughter    Cancer Other    Diabetes Other    Hypertension Other    COPD Brother        "9/11 survivor"   COPD Sister        "9/11 survivor"   Breast cancer Maternal Grandmother        dx in her 38s   Breast cancer Other        MGMs mother died in her 19s   Colon cancer Neg Hx     Prior to Admission medications   Medication Sig Start Date End Date Taking? Authorizing Provider  albuterol (PROVENTIL) (2.5  MG/3ML) 0.083% nebulizer solution MINI-NEB TREATMENT: USE ONE VIAL VIA NEBULIZER EVERY 6 HOURS AS NEEDED FOR WHEEZING Patient taking differently: Take 2.5 mg by nebulization every 6 (six) hours as needed for wheezing. 04/02/22  Yes Isaac Bliss, Rayford Halsted, MD  albuterol (VENTOLIN HFA) 108 (90 Base) MCG/ACT inhaler INHALE TWO PUFFS EVERY 6 HOURS AS NEEDED FOR WHEEZING Patient taking differently: Inhale 2 puffs into the lungs every 6 (six) hours as needed for wheezing. INHALE TWO PUFFS EVERY 6 HOURS AS NEEDED FOR WHEEZING 04/02/22  Yes Isaac Bliss, Rayford Halsted, MD  amLODipine (NORVASC) 5 MG tablet Take 1 tablet (5 mg total) by mouth daily. 03/24/22  Yes Isaac Bliss, Rayford Halsted, MD  Budeson-Glycopyrrol-Formoterol (BREZTRI AEROSPHERE) 160-9-4.8 MCG/ACT AERO Inhale 2 puffs into the lungs in the morning and at bedtime. 04/01/22  Yes Olalere, Adewale A, MD  gabapentin (NEURONTIN) 100 MG capsule TAKE ONE TABLET BY MOUTH THREE TIMES DAILY Patient taking differently: Take 100 mg by mouth 3 (three) times daily. 03/24/22  Yes Isaac Bliss, Rayford Halsted, MD  hydrochlorothiazide (HYDRODIURIL) 25 MG tablet TAKE ONE-HALF TO 1 TABLET BY MOUTH DAILY AS NEEDED FOR SWELLING Patient taking differently: Take 12.5 mg by mouth daily as needed (for swelling). 03/24/22  Yes Isaac Bliss, Rayford Halsted, MD  loratadine (CLARITIN) 10 MG tablet Take 1 tablet (10 mg total) by mouth daily. 10/22/21  Yes Isaac Bliss, Rayford Halsted, MD  Magnesium 500 MG TABS Take 1 tablet (500 mg total) by mouth at bedtime. 11/07/20  Yes Isaac Bliss, Rayford Halsted, MD  melatonin 5 MG TABS Take 2 tablets (10 mg total) by mouth at bedtime. 04/01/22  Yes Olalere, Adewale A, MD  metoprolol succinate (TOPROL-XL) 50 MG 24 hr tablet TAKE ONE TABLET BY MOUTH DAILY WITH OR IMMEDIATELY FOLLOWING A MEAL Patient taking differently: Take 50 mg by mouth daily. TAKE ONE TABLET BY MOUTH DAILY WITH OR IMMEDIATELY FOLLOWING A MEAL 01/01/22  Yes Isaac Bliss, Rayford Halsted,  MD  mirtazapine (REMERON) 15 MG tablet TAKE ONE TABLET BY MOUTH AT BEDTIME Patient taking differently: Take 15 mg by mouth at bedtime. 11/28/21  Yes Isaac Bliss, Rayford Halsted, MD  pantoprazole (PROTONIX) 40 MG tablet Take 1 tablet (40 mg total) by mouth daily. 02/05/22  Yes Isaac Bliss, Rayford Halsted, MD  pravastatin (PRAVACHOL) 40 MG tablet Take 1 tablet (40 mg total) by mouth every evening. 02/06/22  Yes Isaac Bliss, Rayford Halsted, MD  predniSONE (DELTASONE) 20 MG tablet Take 0.5 tablets (10 mg total) by mouth daily with breakfast. 04/01/22  Yes Olalere, Adewale A, MD  acetaminophen (TYLENOL) 500 MG tablet Take 1,000 mg by mouth every 6 (six) hours as needed for moderate pain. Patient not taking: Reported on 04/14/2022  [provider]  clarithromycin (BIAXIN) 250 MG tablet Take 1 tablet (250 mg total) by mouth 2 (two) times daily. Patient not taking: Reported on 04/14/2022 04/01/22   Laurin Coder, MD  cyanocobalamin (,VITAMIN B-12,) 1000 MCG/ML injection Inject 52m in deltoid once weekly for 4 weeks, then inject 1 ml once a month thereafter Patient not taking: Reported on 04/14/2022 10/23/21   HIsaac Bliss ERayford Halsted MD  famotidine (PEPCID) 20 MG tablet TAKE ONE TABLET BY MOUTH TWICE DAILY Patient not taking: Reported on 04/14/2022 01/01/22   HIsaac Bliss ERayford Halsted MD  fluticasone-salmeterol ([facility]) 500-50 MCG/ACT AEPB Inhale 1 puff into the lungs in the morning and at bedtime. Patient not taking: Reported on 04/14/2022 04/01/22   OLaurin Coder MD  furosemide (LASIX) 20 MG tablet TAKE ONE TABLET BY MOUTH DAILY Patient not taking: Reported on 04/14/2022 01/01/22   HIsaac Bliss ERayford Halsted MD  potassium citrate (UROCIT-K) 10 MEQ (1080 MG) SR tablet TAKE ONE TABLET BY MOUTH EVERY DAY Patient not taking: Reported on 04/14/2022 07/16/21   HIsaac Bliss ERayford Halsted MD  SYRINGE-NEEDLE, DISP, 3 ML (BD SAFETYGLIDE SYRINGE/NEEDLE) 25G X 1" 3 ML MISC Use for B12 injections 10/23/21    HIsaac Bliss ERayford Halsted MD  Vitamin D, Ergocalciferol, (DRISDOL) 1.25 MG (50000 UNIT) CAPS capsule TAKE ONE CAPSULE BY MOUTH EVERY WEEK Patient not taking: Reported on 04/14/2022 05/14/21   HIsaac Bliss ERayford Halsted MD    Physical Exam: Vitals:   04/14/22 1030 04/14/22 1048 04/14/22 1058 04/14/22 1100  BP: 124/72 124/72  131/67  Pulse: (!) 102 (!) 103  (!) 105  Resp: (!) 28 (!) 24  (!) 25  Temp:   98.5 F (36.9 C)   TempSrc:   Oral   SpO2: 96% 97%  96%  Weight:      Height:       General: 76y.o. female resting in bed in NAD ENMT: Nares patent w/o discharge, orophaynx clear, dentition normal, ears w/o discharge/lesions/ulcers Neck: Supple, trachea midline Cardiovascular: RRR, +S1, S2, no m/g/r, equal pulses throughout Respiratory: diffuse insp wheeze, no r/r, slightly increased WOB on 2L [place] GI: BS+, NDNT, no masses noted, no organomegaly noted MSK: No e/c/c Neuro: A&O x 3, no focal deficits Psyc: Appropriate interaction and affect, calm/cooperative  Data Reviewed:  Lab Results  Component Value Date   NA 139 04/14/2022   K 5.5 (H) 04/14/2022   CO2 25 04/14/2022   GLUCOSE 79 04/14/2022   BUN 20 04/14/2022   CREATININE 0.77 04/14/2022   CALCIUM 8.4 (L) 04/14/2022   GFRNONAA >60 04/14/2022   Lab Results  Component Value Date   WBC 12.5 (H) 04/14/2022   HGB 11.7 (L) 04/14/2022   HCT 36.5 04/14/2022   MCV 85.3 04/14/2022   PLT 292 04/14/2022   CXR: Stable severe chronic fibrotic changes in the lungs, likely related to patient's history of sarcoidosis. No definite acute abnormality.  CTH: 1. No acute intracranial abnormality. 2. Atrophy with chronic small vessel ischemic disease.  XR Pelvis: No acute findings.  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 Chronic hypoxic respiratory failure on chronic 2L [place]     - steroid taper (she's on chronic '10mg'$ ), nebs, guaifenesin     Hypoglycemia Severe protein calorie malnutrition     - encourage diet, q4h glucose checks for next 24hrs     - dietitian consult  Hyperkalemia     - lokelma x1; trend  Acute metabolic encephalopathy     - secondary to above  Lower extremity weakness     - doesn't quite fit Wernicke's     - PT eval     - continue thiamine  EtOH abuse     - CIWA     - MVI, thiamine, folate  HTN     - continue home regimen when BP tolerates  GERD     - PPI  HLD     - continue home regimen  Advance Care Planning:   Code Status: FULL  Consults: None  Family Communication: w/ daughter by phone  Severity of Illness: The appropriate patient status for this patient is INPATIENT. Inpatient status is judged to be reasonable and necessary in order to provide the required intensity of service to ensure the patient's safety. The patient's presenting symptoms, physical exam findings, and initial radiographic and laboratory data in the context of their chronic comorbidities is felt to place them at high risk for further clinical deterioration. Furthermore, it is not anticipated that the patient will be medically stable for discharge from the hospital within 2 midnights of admission.   * I certify that at the point of admission it is my clinical judgment that the patient will require inpatient hospital care spanning beyond 2 midnights from the point of admission due to high intensity of service, high risk for further deterioration and high frequency of surveillance required.*  Time spent in coordination of this H&P: 74 minutes  Author: Jonnie Finner, DO 04/14/2022 11:28 AM  For on call review [email or web address].

## 2022-04-15 DIAGNOSIS — G9340 Encephalopathy, unspecified: Secondary | ICD-10-CM | POA: Diagnosis not present

## 2022-04-15 DIAGNOSIS — A419 Sepsis, unspecified organism: Secondary | ICD-10-CM | POA: Diagnosis not present

## 2022-04-15 DIAGNOSIS — R6520 Severe sepsis without septic shock: Secondary | ICD-10-CM | POA: Diagnosis not present

## 2022-04-15 LAB — GLUCOSE, CAPILLARY
Glucose-Capillary: 120 mg/dL — ABNORMAL HIGH (ref 70–99)
Glucose-Capillary: 134 mg/dL — ABNORMAL HIGH (ref 70–99)
Glucose-Capillary: 148 mg/dL — ABNORMAL HIGH (ref 70–99)
Glucose-Capillary: 153 mg/dL — ABNORMAL HIGH (ref 70–99)

## 2022-04-15 LAB — CBC
HCT: 27.5 % — ABNORMAL LOW (ref 36.0–46.0)
Hemoglobin: 9.2 g/dL — ABNORMAL LOW (ref 12.0–15.0)
MCH: 27.2 pg (ref 26.0–34.0)
MCHC: 33.5 g/dL (ref 30.0–36.0)
MCV: 81.4 fL (ref 80.0–100.0)
Platelets: 220 10*3/uL (ref 150–400)
RBC: 3.38 MIL/uL — ABNORMAL LOW (ref 3.87–5.11)
RDW: 17.2 % — ABNORMAL HIGH (ref 11.5–15.5)
WBC: 5.4 10*3/uL (ref 4.0–10.5)
nRBC: 0 % (ref 0.0–0.2)

## 2022-04-15 LAB — PROTIME-INR
INR: 1 (ref 0.8–1.2)
Prothrombin Time: 12.8 seconds (ref 11.4–15.2)

## 2022-04-15 LAB — COMPREHENSIVE METABOLIC PANEL
ALT: 14 U/L (ref 0–44)
AST: 25 U/L (ref 15–41)
Albumin: 2.5 g/dL — ABNORMAL LOW (ref 3.5–5.0)
Alkaline Phosphatase: 79 U/L (ref 38–126)
Anion gap: 10 (ref 5–15)
BUN: 9 mg/dL (ref 8–23)
CO2: 28 mmol/L (ref 22–32)
Calcium: 8 mg/dL — ABNORMAL LOW (ref 8.9–10.3)
Chloride: 96 mmol/L — ABNORMAL LOW (ref 98–111)
Creatinine, Ser: 0.73 mg/dL (ref 0.44–1.00)
GFR, Estimated: >60 mL/min (ref >60)
Glucose, Bld: 150 mg/dL — ABNORMAL HIGH (ref 70–99)
Potassium: 3.7 mmol/L (ref 3.5–5.1)
Sodium: 134 mmol/L — ABNORMAL LOW (ref 135–145)
Total Bilirubin: 0.7 mg/dL (ref 0.3–1.2)
Total Protein: 6.4 g/dL — ABNORMAL LOW (ref 6.5–8.1)

## 2022-04-15 LAB — CORTISOL-AM, BLOOD: Cortisol - AM: 7.2 ug/dL (ref 6.7–22.6)

## 2022-04-15 LAB — PROCALCITONIN: Procalcitonin: 1.65 ng/mL

## 2022-04-15 LAB — MAGNESIUM: Magnesium: 1.8 mg/dL (ref 1.7–2.4)

## 2022-04-15 LAB — STREP PNEUMONIAE URINARY ANTIGEN: Strep Pneumo Urinary Antigen: NEGATIVE

## 2022-04-15 MED ORDER — ENSURE ENLIVE PO LIQD
237.0000 mL | Freq: Three times a day (TID) | ORAL | Status: DC
  Administered 2022-04-15 – 2022-04-17 (×4): 237 mL via ORAL

## 2022-04-15 MED ORDER — METOPROLOL TARTRATE 5 MG/5ML IV SOLN: 10.0000 mg | INTRAVENOUS | Status: DC | PRN

## 2022-04-15 MED ORDER — FLUTICASONE FUROATE-VILANTEROL 100-25 MCG/ACT IN AEPB
1.0000 | INHALATION_SPRAY | Freq: Every day | RESPIRATORY_TRACT | Status: DC
Start: 2022-04-15 — End: 2022-04-17
  Administered 2022-04-15 – 2022-04-17 (×3): 1 via RESPIRATORY_TRACT
  Filled 2022-04-15: qty 28

## 2022-04-15 MED ORDER — UMECLIDINIUM BROMIDE 62.5 MCG/ACT IN AEPB
1.0000 | INHALATION_SPRAY | Freq: Every day | RESPIRATORY_TRACT | Status: DC
Start: 2022-04-15 — End: 2022-04-17
  Administered 2022-04-15 – 2022-04-17 (×3): 1 via RESPIRATORY_TRACT
  Filled 2022-04-15: qty 7

## 2022-04-15 MED ORDER — DOXYCYCLINE HYCLATE 100 MG PO TABS
100.0000 mg | ORAL_TABLET | Freq: Two times a day (BID) | ORAL | Status: DC
  Administered 2022-04-15 – 2022-04-16 (×4): 100 mg via ORAL
  Filled 2022-04-15 (×5): qty 1

## 2022-04-15 MED ORDER — METOPROLOL TARTRATE 5 MG/5ML IV SOLN
2.5000 mg | Freq: Once | INTRAVENOUS | Status: AC
  Administered 2022-04-15: 2.5 mg via INTRAVENOUS
  Filled 2022-04-15: qty 5

## 2022-04-15 MED ORDER — SODIUM CHLORIDE 0.9 % IV SOLN: INTRAVENOUS | Status: DC | PRN

## 2022-04-15 MED ORDER — BUDESON-GLYCOPYRROL-FORMOTEROL 160-9-4.8 MCG/ACT IN AERO: 2.0000 | INHALATION_SPRAY | Freq: Two times a day (BID) | RESPIRATORY_TRACT | Status: DC

## 2022-04-15 MED ORDER — IPRATROPIUM-ALBUTEROL 0.5-2.5 (3) MG/3ML IN SOLN
3.0000 mL | Freq: Two times a day (BID) | RESPIRATORY_TRACT | Status: DC
  Administered 2022-04-16 – 2022-04-17 (×4): 3 mL via RESPIRATORY_TRACT
  Filled 2022-04-15 (×3): qty 3

## 2022-04-15 MED ORDER — SODIUM CHLORIDE 0.9 % IV SOLN
1.0000 g | INTRAVENOUS | Status: DC
  Administered 2022-04-15 – 2022-04-16 (×2): 1 g via INTRAVENOUS
  Filled 2022-04-15 (×3): qty 10

## 2022-04-15 MED ORDER — ALBUTEROL SULFATE HFA 108 (90 BASE) MCG/ACT IN AERS: 2.0000 | INHALATION_SPRAY | Freq: Four times a day (QID) | RESPIRATORY_TRACT | Status: DC | PRN

## 2022-04-15 MED ORDER — ALBUTEROL SULFATE (2.5 MG/3ML) 0.083% IN NEBU: 2.5000 mg | INHALATION_SOLUTION | Freq: Four times a day (QID) | RESPIRATORY_TRACT | Status: DC | PRN

## 2022-04-15 MED ORDER — CHLORHEXIDINE GLUCONATE CLOTH 2 % EX PADS
6.0000 | MEDICATED_PAD | Freq: Every day | CUTANEOUS | Status: DC
  Administered 2022-04-16 – 2022-04-17 (×2): 6 via TOPICAL

## 2022-04-15 MED ORDER — CLONIDINE HCL 0.1 MG PO TABS: 0.1000 mg | ORAL_TABLET | Freq: Three times a day (TID) | ORAL | Status: DC | PRN

## 2022-04-15 NOTE — Progress Notes (Addendum)
0617 - pt HR noted to become as high as 192 (only able to capture upt to 185 in Epic) while asleep, hypertensive. Pt woken up, denies pain, but has been with positive scores on the CIWA scale, with PRNs given per order.  Pt attached to EKG - unable to capture event.  Gershon Cull NP notified, labs reviewed and lab mag ordered.  2.'5mg'$  IV lopressor ordered and given
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
## 2022-04-15 NOTE — Progress Notes (Signed)
PT Cancellation Note  Patient Details Name: [name] MRN: [id number] DOB: 13-Mar-1946   Cancelled Treatment:    Reason Eval/Treat Not Completed: Medical issues which prohibited therapy (Elevated BP and tachycardia, pt getting medicated this AM per RN. Will follow up at later date/time as schedule allows and pt medically ready.)   Verner Mould, DPT Acute Rehabilitation Services Office [phone] Pager [phone]  04/15/22 8:53 AM

## 2022-04-15 NOTE — TOC Initial Note (Signed)
Transition of Care [facility]) - Initial/Assessment Note    Patient Details  Name: [name] MRN: [id number] Date of Birth: [birth year]  Transition of Care [facility]) CM/SW Contact:    Leeroy Cha, RN Phone Number: 04/15/2022, 8:34 AM  Clinical Narrative:                 Substance abuse resources added into the dc instructions for patient use.  Expected Discharge Plan: Home/Self Care Barriers to Discharge: Continued Medical Work up   Patient Goals and CMS Choice Patient states their goals for this hospitalization and ongoing recovery are:: to go home CMS Medicare.gov Compare Post Acute Care list provided to:: Patient    Expected Discharge Plan and Services Expected Discharge Plan: Home/Self Care   Discharge Planning Services: CM Consult   Living arrangements for the past 2 months: Single Family Home                                      Prior Living Arrangements/Services Living arrangements for the past 2 months: Single Family Home Lives with:: Self (widowed) Patient language and need for interpreter reviewed:: Yes Do you feel safe going back to the place where you live?: Yes      Need for Family Participation in Patient Care: Yes (Comment) (daughter)     Criminal Activity/Legal Involvement Pertinent to Current Situation/Hospitalization: No - Comment as needed  Activities of Daily Living      Permission Sought/Granted                  Emotional Assessment Appearance:: Appears stated age Attitude/Demeanor/Rapport: Engaged Affect (typically observed): Calm Orientation: : Oriented to Self, Oriented to Place, Oriented to  Time, Oriented to Situation Alcohol / Substance Use: Alcohol Use, Tobacco Use (tobacco quit 11 years ago, current etoh use, no drug use) Psych Involvement: No (comment)  Admission diagnosis:  Dehydration [E86.0] Hypoglycemia [E16.2] COPD exacerbation ([place]) [J44.1] Failure to thrive in adult [R62.7] Alcohol abuse with  intoxication ([place]) [F10.129] Sepsis ([place]) [A41.9] Patient Active Problem List   Diagnosis Date Noted   Sepsis ([place]) 04/14/2022   Hyperkalemia 04/14/2022   Pulmonary fibrosis ([place]) 04/14/2022   Protein-calorie malnutrition, severe ([place]) 04/14/2022   Alcohol use 04/14/2022   Lower extremity weakness 04/14/2022   Dyspnea 10/24/2021   Head trauma 10/24/2021   Vitamin B12 deficiency 10/22/2021   Posterior dislocation of right hip ([place]) 04/08/2021   Genetic testing 09/04/2020   Family history of breast cancer    Chronic respiratory failure with hypoxia ([place]) 08/28/2019   CAP (community acquired pneumonia) 08/06/2019   Acute respiratory failure with hypoxia ([place])    Shock ([place])    Central venous catheter in place    AKI (acute kidney injury) ([place])    Prolonged QT interval    Pulmonary hypertension ([place])    Hyponatremia    Hypoglycemia    Encephalopathy acute    Severe sepsis ([place]) 05/31/2019   Low bone mass 05/24/2018   IBS (irritable bowel syndrome) 03/01/2018   Diarrhea 12/22/2017   Falls 12/26/2015   Mixed incontinence urge and stress 12/26/2015   Hypokalemia 10/03/2015   Encounter for central line placement 09/12/2014   Dyslipidemia 12/31/2012   Allergic rhinitis 12/31/2012   Osteoarthritis, hip, bilateral 11/01/2010   Unintentional weight loss 03/19/2009   COPD with acute exacerbation ([place]) 02/28/2009   Sarcoidosis 02/05/2009   GERD 01/18/2009   Essential hypertension, benign  06/08/2007   PCP:  Isaac Bliss, Rayford Halsted, MD Pharmacy:   [facility] [phone], [place] [address] Phone: [phone] Fax: [phone]  [facility] [id number], [place] [address] Phone: [phone] Fax: [phone]  [facility] [place]  Dr. Kristeen Mans [address] Kristeen Mans [place] [address] Phone: [phone] Fax: [phone]     Social Determinants of Health (SDOH) Interventions    Readmission Risk Interventions   No data to display

## 2022-04-15 NOTE — Progress Notes (Signed)
Initial Nutrition Assessment  DOCUMENTATION CODES:   Underweight  INTERVENTION:   -Ensure Plus High Protein po TID, each supplement provides 350 kcal and 20 grams of protein.   NUTRITION DIAGNOSIS:   Increased nutrient needs related to chronic illness as evidenced by estimated needs.  GOAL:   Patient will meet greater than or equal to 90% of their needs  MONITOR:   PO intake, Supplement acceptance, Labs, Weight trends, I & O's  REASON FOR ASSESSMENT:   Consult Assessment of nutrition requirement/status  ASSESSMENT:   76 y.o. female with medical history significant of COPD, chronic hypoxic respiratory failure, HTN, sarcoidosis. Presenting with altered mental status.  Patient in room receiving patient care from RN and staff.  Pt now more alert/oriented. Only consuming bites from meals. Have ordered Ensure supplements.   Per weight records, pt has lost 4 lbs since December 2022.  Medications: Folic acid, Multivitamin with minerals daily, Thiamine, D5 infusion  Labs reviewed: CBGs: 134-220 Low Na   NUTRITION - FOCUSED PHYSICAL EXAM:  Unable to complete  Diet Order:   Diet Order             Diet Heart Room service appropriate? Yes; Fluid consistency: Thin  Diet effective now                   EDUCATION NEEDS:   No education needs have been identified at this time  Skin:  Skin Assessment: Reviewed RN Assessment  Last BM:  9/5  Height:   Ht Readings from Last 1 Encounters:  04/14/22 '5\' 3"'$  (1.6 m)    Weight:   Wt Readings from Last 1 Encounters:  04/14/22 45.4 kg    BMI:  Body mass index is 17.71 kg/m.  Estimated Nutritional Needs:   Kcal:  1650-1850  Protein:  75-90g  Fluid:  1.8L/day  Clayton Bibles, MS, RD, LDN Inpatient Clinical Dietitian Contact information available via Amion

## 2022-04-16 DIAGNOSIS — Z789 Other specified health status: Secondary | ICD-10-CM | POA: Diagnosis not present

## 2022-04-16 DIAGNOSIS — J441 Chronic obstructive pulmonary disease with (acute) exacerbation: Secondary | ICD-10-CM | POA: Diagnosis not present

## 2022-04-16 DIAGNOSIS — G9341 Metabolic encephalopathy: Secondary | ICD-10-CM

## 2022-04-16 DIAGNOSIS — J9611 Chronic respiratory failure with hypoxia: Secondary | ICD-10-CM | POA: Diagnosis not present

## 2022-04-16 DIAGNOSIS — I471 Supraventricular tachycardia: Secondary | ICD-10-CM

## 2022-04-16 DIAGNOSIS — E785 Hyperlipidemia, unspecified: Secondary | ICD-10-CM

## 2022-04-16 LAB — GLUCOSE, CAPILLARY
Glucose-Capillary: 165 mg/dL — ABNORMAL HIGH (ref 70–99)
Glucose-Capillary: 122 mg/dL — ABNORMAL HIGH (ref 70–99)
Glucose-Capillary: 106 mg/dL — ABNORMAL HIGH (ref 70–99)
Glucose-Capillary: 126 mg/dL — ABNORMAL HIGH (ref 70–99)
Glucose-Capillary: 145 mg/dL — ABNORMAL HIGH (ref 70–99)
Glucose-Capillary: 198 mg/dL — ABNORMAL HIGH (ref 70–99)
Glucose-Capillary: 104 mg/dL — ABNORMAL HIGH (ref 70–99)

## 2022-04-16 LAB — BASIC METABOLIC PANEL
Anion gap: 8 (ref 5–15)
BUN: 10 mg/dL (ref 8–23)
CO2: 32 mmol/L (ref 22–32)
Calcium: 8.7 mg/dL — ABNORMAL LOW (ref 8.9–10.3)
Chloride: 96 mmol/L — ABNORMAL LOW (ref 98–111)
Creatinine, Ser: 0.73 mg/dL (ref 0.44–1.00)
GFR, Estimated: >60 mL/min (ref >60)
Glucose, Bld: 115 mg/dL — ABNORMAL HIGH (ref 70–99)
Potassium: 3.3 mmol/L — ABNORMAL LOW (ref 3.5–5.1)
Sodium: 136 mmol/L (ref 135–145)

## 2022-04-16 LAB — LEGIONELLA PNEUMOPHILA SEROGP 1 UR AG: L. pneumophila Serogp 1 Ur Ag: NEGATIVE

## 2022-04-16 LAB — CBC
HCT: 33.9 % — ABNORMAL LOW (ref 36.0–46.0)
Hemoglobin: 11.1 g/dL — ABNORMAL LOW (ref 12.0–15.0)
MCH: 27.3 pg (ref 26.0–34.0)
MCHC: 32.7 g/dL (ref 30.0–36.0)
MCV: 83.5 fL (ref 80.0–100.0)
Platelets: 267 10*3/uL (ref 150–400)
RBC: 4.06 MIL/uL (ref 3.87–5.11)
RDW: 17.6 % — ABNORMAL HIGH (ref 11.5–15.5)
WBC: 7.5 10*3/uL (ref 4.0–10.5)
nRBC: 0 % (ref 0.0–0.2)

## 2022-04-16 NOTE — Evaluation (Signed)
Physical Therapy Evaluation Patient Details Name: [name] MRN: [id number] DOB: 1945/10/25 Today's Date: 04/16/2022  History of Present Illness  Pt is 76 yo female admitted 04/14/22 with sepsis due to PNE and resp failure.  Pt also with ETOH withdrawl symptoms, malnutrition, and hypoglycemia.  Pt with hx of COPD, chronic hypoxic respiratory failure, HTN, sarcoidosis.  Clinical Impression  Pt admitted with above diagnosis. At baseline, pt reports independent but increased falls recently.  Today, pt demonstrating generalized weakness and requiring min to mod A for transfers and limited gait.  Pt did report dizziness and found to have mild orthostatic hypotension.  If home would need near 24 hr support.  At this time recommending SNF.  Pt currently with functional limitations due to the deficits listed below (see PT Problem List). Pt will benefit from skilled PT to increase their independence and safety with mobility to allow discharge to the venue listed below.          Recommendations for follow up therapy are one component of a multi-disciplinary discharge planning process, led by the attending physician.  Recommendations may be updated based on patient status, additional functional criteria and insurance authorization.  Follow Up Recommendations Skilled nursing-short term rehab (<3 hours/day) (potential to progress to home w HHPT if has assistance) Can patient physically be transported by private vehicle: Yes    Assistance Recommended at Discharge Frequent or constant Supervision/Assistance  Patient can return home with the following  A little help with walking and/or transfers;A little help with bathing/dressing/bathroom;Assistance with cooking/housework;Help with stairs or ramp for entrance    Equipment Recommendations None recommended by PT  Recommendations for Other Services       Functional Status Assessment Patient has had a recent decline in their functional status and  demonstrates the ability to make significant improvements in function in a reasonable and predictable amount of time.     Precautions / Restrictions Precautions Precautions: Fall Precaution Comments: orthostatic bp      Mobility  Bed Mobility Overal bed mobility: Needs Assistance Bed Mobility: Supine to Sit     Supine to sit: Min assist          Transfers Overall transfer level: Needs assistance Equipment used: Rolling walker (2 wheels) Transfers: Sit to/from Stand Sit to Stand: Min assist, From elevated surface                Ambulation/Gait Ambulation/Gait assistance: Mod assist Gait Distance (Feet): 15 Feet (15'x2) Assistive device: Rolling walker (2 wheels) Gait Pattern/deviations: Step-to pattern, Decreased stride length, Trunk flexed Gait velocity: decreased     General Gait Details: Overall min A but did have LOB requiring mod A.  Cues for RW.  Reports dizzy (see general comments, orthostatic bp)  Stairs            Wheelchair Mobility    Modified Rankin (Stroke Patients Only)       Balance Overall balance assessment: Needs assistance Sitting-balance support: No upper extremity supported Sitting balance-Leahy Scale: Fair     Standing balance support: Bilateral upper extremity supported, Reliant on assistive device for balance Standing balance-Leahy Scale: Poor Standing balance comment: RW and min to mod A                             Pertinent Vitals/Pain Pain Assessment Pain Assessment: 0-10 Pain Score: 2  Pain Location: R shoulder Pain Descriptors / Indicators: Sore Pain Intervention(s): Limited activity within patient's tolerance,  Monitored during session    Home Living Family/patient expects to be discharged to:: Private residence Living Arrangements: Children Available Help at Discharge: Family;Available 24 hours/day Type of Home: House Home Access: Stairs to enter Entrance Stairs-Rails: Counsellor of Steps: 2   Home Layout: One level Home Equipment: Conservation officer, nature (2 wheels);Cane - single point;BSC/3in1;Shower seat      Prior Function               Mobility Comments: Ambulates community distances; uses cane; reports several falls in 6 months ADLs Comments: independent with ADLs and IADLs (cooks, clothes, etc)     Hand Dominance   Dominant Hand: Right    Extremity/Trunk Assessment   Upper Extremity Assessment Upper Extremity Assessment: LUE deficits/detail;RUE deficits/detail RUE Deficits / Details: Very limited shoulder ROM baseline; MMT 4-/5 LUE Deficits / Details: Very limited shoulder ROM baseline; MMT 4-/5    Lower Extremity Assessment Lower Extremity Assessment: LLE deficits/detail;RLE deficits/detail RLE Deficits / Details: ROM WFL; MMT 4/5 LLE Deficits / Details: ROM WFL; MMT 4/5    Cervical / Trunk Assessment Cervical / Trunk Assessment: Normal  Communication   Communication: No difficulties  Cognition Arousal/Alertness: Awake/alert Behavior During Therapy: WFL for tasks assessed/performed Overall Cognitive Status: No family/caregiver present to determine baseline cognitive functioning                                 General Comments: Overall cognition WFL, some decreased safety awareness/awareness of deficits        General Comments General comments (skin integrity, edema, etc.): Pt on 2.5 L O2 rest and 3 L to ambulate with sats >93%.  She did report dizziness when walking.  Checked and BP was110/54 with return to sitting.  Next bout of ambulation BP was 99/46.  After sitting reclined up to 124/90.  HR 70's to 90's    Exercises     Assessment/Plan    PT Assessment Patient needs continued PT services  PT Problem List Decreased strength;Decreased mobility;Decreased safety awareness;Decreased range of motion;Decreased activity tolerance;Cardiopulmonary status limiting activity;Decreased cognition;Decreased  balance;Decreased knowledge of use of DME       PT Treatment Interventions DME instruction;Therapeutic activities;Gait training;Therapeutic exercise;Patient/family education;Stair training;Balance training;Functional mobility training;Neuromuscular re-education    PT Goals (Current goals can be found in the Care Plan section)  Acute Rehab PT Goals Patient Stated Goal: return home - states has had aides in past PT Goal Formulation: With patient Time For Goal Achievement: 04/30/22 Potential to Achieve Goals: Good    Frequency Min 3X/week     Co-evaluation               AM-PAC PT "6 Clicks" Mobility  Outcome Measure Help needed turning from your back to your side while in a flat bed without using bedrails?: A Little Help needed moving from lying on your back to sitting on the side of a flat bed without using bedrails?: A Little Help needed moving to and from a bed to a chair (including a wheelchair)?: A Lot Help needed standing up from a chair using your arms (e.g., wheelchair or bedside chair)?: A Lot Help needed to walk in hospital room?: Total Help needed climbing 3-5 steps with a railing? : Total 6 Click Score: 12    End of Session Equipment Utilized During Treatment: Gait belt Activity Tolerance: Patient limited by fatigue;Treatment limited secondary to medical complications (Comment) (orthostatic hypotension) Patient left: in chair;with  chair alarm set;with call bell/phone within reach Nurse Communication: Mobility status PT Visit Diagnosis: Other abnormalities of gait and mobility (R26.89);Muscle weakness (generalized) (M62.81);History of falling (Z91.81)    Time: 7510-2585 PT Time Calculation (min) (ACUTE ONLY): 35 min   Charges:   PT Evaluation $PT Eval Moderate Complexity: 1 Mod PT Treatments $Therapeutic Activity: 8-22 mins        Abran Richard, PT Acute Rehab [facility] Rehab [phone]   Karlton Lemon 04/16/2022, 12:55 PM

## 2022-04-16 NOTE — Assessment & Plan Note (Signed)
Stable on home O2 

## 2022-04-16 NOTE — Assessment & Plan Note (Signed)
No SVT overnight - Continue metoprolol

## 2022-04-16 NOTE — Hospital Course (Signed)
[name] is a 76 y.o. female with medical history significant of COPD, chronic hypoxic respiratory failure, HTN, sarcoidosis. Presenting with altered mental status. History is per daughter by phone. She reports that her mother fell yesterday while trying to get to the bathroom. She didn't hit her head or pass out. She was assisted by her family back to her bed. She seemed ok at the time. However, it didn't appear that she are for the rest of the day. Her daughter checked on her last night and she appeared to be staring off into space. She was breathing very lightly and not responding to verbal command. Her daughter became concerned and called for EMS. She denies any other aggravating or alleviating factors.

## 2022-04-16 NOTE — Assessment & Plan Note (Addendum)
P/w tachycardia, tachypnea.  Presumed source aspiration pneumonia in setting of alcohol use.  Has poor dentition but no concern for empyema or abscess on chest imaging, will defer anaerobic coverage. - Continue Rocephin and doxycycline for 5 days

## 2022-04-16 NOTE — Assessment & Plan Note (Signed)
Resolved with fluids °

## 2022-04-16 NOTE — Progress Notes (Signed)
  Progress Note   Patient: [name] YYQ:[id number] DOB: 01-Mar-1946 DOA: 04/14/2022     2 DOS: the patient was seen and examined on 04/16/2022 at 9:36AM      Brief hospital course: [name] is a 76 year old F with COPD, chronic respiratory failure, HTN, sarcoidosis who presented with hyperglycemia and decreased mental status.  Found to have sepsis from aspiration pneumonia.      Assessment and Plan: * Acute metabolic encephalopathy Patient was unresponsive and found to be hypoglycemic.  She was given dextrose and her mentation resolved.  Today she seems at baseline.  Hypoglycemia Not on antidiabetics.  Presumably because was sepsis, ?poor oral intake due to alcohol?  No known cirrhosis.    Now resolved.  Sepsis without end organ damage P/w tachycardia, tachypnea.  Presumed source aspiration pneumonia in setting of alcohol use.  Has poor dentition but no concern for empyema or abscess on chest imaging, will defer anaerobic coverage. - Continue Rocephin and doxycycline for 5 days  Sarcoidosis Still wheezing.  Stable on home oxygen.  FEV1 48% in 2010. - Continue prednisone - Continue ICS/LABA/LAMA -Continue DuoNeb twice daily, Mucinex  Paroxysmal SVT (supraventricular tachycardia) (HCC) No SVT overnight - Continue metoprolol  Lower extremity weakness Generalized weakness due to pneumonia, hypoglycemia. - PT  Alcohol use No evidence of withdrawal - Continue thiamine and folate  Protein-calorie malnutrition, moderate (HCC) As evidenced by BMI 17, moderate loss of subcutaneous muscle mass and fat diffusely - Consult dietitian  Pulmonary fibrosis (HCC)    Hyperkalemia Resolved with fluids.  Now with hypokalemia - Supplement potassium  Chronic respiratory failure with hypoxia (HCC) Stable on home O2  Hyponatremia Resolved with fluids  Dyslipidemia - Continue pravastatin  GERD - Continue pantoprazole  COPD with acute exacerbation (HCC) - Continue  prednisone, bronchodilators, DuoNeb, Mucinex  Essential hypertension, benign Blood pressure controlled - Continue amlodipine, metoprolol          Subjective: Patient's mentation is improved, she has had some breakfast.  Her breathing feels slightly better, she is overall weak, tired, no chest pain, no headache.     Physical Exam: BP 121/70   Pulse 85   Temp 97.6 F (36.4 C) (Oral)   Resp (!) 28   Ht '5\' 3"'$  (1.6 m)   Wt 45.4 kg   SpO2 100%   BMI 17.71 kg/m   Thin elderly female, lying in bed, eating breakfast RRR, no murmurs, no peripheral edema Respiratory rate slightly increased, wheezing bilaterally, crackles at bilateral bases Abdomen soft without tenderness palpation or guarding, no ascites or distention Attention normal, affect blunted, judgment insight appear at baseline, face symmetric, speech fluent, moves upper extremities with generalized weakness with symmetric strength    Data Reviewed: Glucose normal Strep pneumo urine antigen negative Potassium 3.3, creatinine stable CTA chest with no PE, positive for blood, chronic changes, bilateral effusions       Disposition: Status is: Inpatient         Author: Edwin Dada, MD 04/16/2022 1:29 PM  For on call review [email or web address].

## 2022-04-16 NOTE — Assessment & Plan Note (Signed)
Blood pressure controlled - Continue amlodipine, metoprolol

## 2022-04-16 NOTE — Assessment & Plan Note (Signed)
No evidence of withdrawal - Continue thiamine and folate

## 2022-04-16 NOTE — Assessment & Plan Note (Signed)
-   Continue prednisone, bronchodilators, DuoNeb, Mucinex

## 2022-04-16 NOTE — Assessment & Plan Note (Signed)
As evidenced by BMI 17, moderate loss of subcutaneous muscle mass and fat diffusely - Consult dietitian

## 2022-04-16 NOTE — TOC Progression Note (Signed)
Transition of Care [facility]) - Progression Note    Patient Details  Name: [name] MRN: [id number] Date of Birth: 29-May-1946  Transition of Care [facility]) CM/SW Contact  Leeroy Cha, RN Phone Number: 04/16/2022, 2:11 PM  Clinical Narrative:    Spoke with patient ask that I call daughter and talk to her about rehab placement. Tct-allie Brensinger discussed decision for mother to go rehab .  Will call back with preference of rehab. Will need auth with preference.   Expected Discharge Plan: Home/Self Care Barriers to Discharge: Continued Medical Work up  Expected Discharge Plan and Services Expected Discharge Plan: Home/Self Care   Discharge Planning Services: CM Consult   Living arrangements for the past 2 months: Single Family Home                                       Social Determinants of Health (SDOH) Interventions    Readmission Risk Interventions   No data to display

## 2022-04-16 NOTE — Assessment & Plan Note (Signed)
Still wheezing.  Stable on home oxygen.  FEV1 48% in 2010. - Continue prednisone - Continue ICS/LABA/LAMA -Continue DuoNeb twice daily, Mucinex

## 2022-04-16 NOTE — Assessment & Plan Note (Signed)
Not on antidiabetics.  Presumably because was sepsis, ?poor oral intake due to alcohol?  No known cirrhosis.    Now resolved.

## 2022-04-16 NOTE — Assessment & Plan Note (Addendum)
Patient was unresponsive and found to be hypoglycemic.  She was given dextrose and her mentation resolved.  Today she seems at baseline.

## 2022-04-16 NOTE — Assessment & Plan Note (Signed)
Continue pantoprazole. °

## 2022-04-16 NOTE — Assessment & Plan Note (Signed)
Generalized weakness due to pneumonia, hypoglycemia. - PT

## 2022-04-16 NOTE — Assessment & Plan Note (Signed)
Continue pravastatin 

## 2022-04-16 NOTE — Assessment & Plan Note (Addendum)
Resolved with fluids.  Now with hypokalemia - Supplement potassium

## 2022-04-16 NOTE — [place] FL2 (Signed)
[place] MEDICAID FL2 LEVEL OF CARE SCREENING TOOL     IDENTIFICATION  Patient Name: [name] Birthdate: November 15, 1945 Sex: female Admission Date (Current Location): 04/14/2022  [facility] and [place] Number:  Herbalist and Address:  [facility],  [address] [place], [place]      Provider Number: [id number]  Attending Physician Name and Address:  Edwin Dada, *  Relative Name and Phone Number:       Current Level of Care: Hospital Recommended Level of Care: [place] Prior Approval Number:    Date Approved/Denied:   PASRR Number: [id number]  Discharge Plan: SNF    Current Diagnoses: Patient Active Problem List   Diagnosis Date Noted   Paroxysmal SVT (supraventricular tachycardia) ([place]) 04/16/2022   Sepsis without end organ damage 04/14/2022   Hyperkalemia 04/14/2022   Pulmonary fibrosis ([place]) 04/14/2022   Protein-calorie malnutrition, moderate ([place]) 04/14/2022   Alcohol use 04/14/2022   Lower extremity weakness 04/14/2022   Dyspnea 10/24/2021   Head trauma 10/24/2021   Vitamin B12 deficiency 10/22/2021   Posterior dislocation of right hip ([place]) 04/08/2021   Genetic testing 09/04/2020   Family history of breast cancer    Chronic respiratory failure with hypoxia ([place]) 08/28/2019   CAP (community acquired pneumonia) 08/06/2019   Acute respiratory failure with hypoxia ([place])    Shock ([place])    Central venous catheter in place    AKI (acute kidney injury) ([place])    Prolonged QT interval    Pulmonary hypertension ([place])    Hyponatremia    Hypoglycemia    Acute metabolic encephalopathy    Severe sepsis ([place]) 05/31/2019   Low bone mass 05/24/2018   IBS (irritable bowel syndrome) 03/01/2018   Diarrhea 12/22/2017   Falls 12/26/2015   Mixed incontinence urge and stress 12/26/2015   Hypokalemia 10/03/2015   Encounter for central line placement 09/12/2014   Dyslipidemia 12/31/2012   Allergic rhinitis  12/31/2012   Osteoarthritis, hip, bilateral 11/01/2010   Unintentional weight loss 03/19/2009   COPD with acute exacerbation ([place]) 02/28/2009   Sarcoidosis 02/05/2009   GERD 01/18/2009   Essential hypertension, benign 06/08/2007    Orientation RESPIRATION BLADDER Height & Weight     Self, Time, Situation, Place  Normal Continent Weight: 45.4 kg Height:  '5\' 3"'$  (160 cm)  BEHAVIORAL SYMPTOMS/MOOD NEUROLOGICAL BOWEL NUTRITION STATUS      Continent Diet (regular)  AMBULATORY STATUS COMMUNICATION OF NEEDS Skin   Extensive Assist Verbally Normal                       Personal Care Assistance Level of Assistance  Bathing, Feeding, Dressing Bathing Assistance: Limited assistance Feeding assistance: Limited assistance Dressing Assistance: Limited assistance     Functional Limitations Info  Sight, Hearing, Speech Sight Info: Adequate Hearing Info: Adequate Speech Info: Adequate    SPECIAL CARE FACTORS FREQUENCY  PT (By licensed PT), OT (By licensed OT)     PT Frequency: 5 x weekly OT Frequency: 5 x weekly            Contractures Contractures Info: Not present    Additional Factors Info  Code Status Code Status Info: full             Current Medications (04/16/2022):  This is the current hospital active medication list Current Facility-Administered Medications  Medication Dose Route Frequency Provider Last Rate Last Admin   0.9 %  sodium chloride infusion   Intravenous PRN Pahwani,  Einar Grad, MD   Stopped at 04/15/22 1729   acetaminophen (TYLENOL) tablet 650 mg  650 mg Oral Q6H PRN Cherylann Ratel A, DO   650 mg at 04/14/22 1552   Or   acetaminophen (TYLENOL) suppository 650 mg  650 mg Rectal Q6H PRN Marylyn Ishihara, Tyrone A, DO       albuterol (PROVENTIL) (2.5 MG/3ML) 0.083% nebulizer solution 2.5 mg  2.5 mg Nebulization Q6H PRN Darliss Cheney, MD       amLODipine (NORVASC) tablet 5 mg  5 mg Oral Daily Kyle, Tyrone A, DO   5 mg at 04/16/22 0931   cefTRIAXone (ROCEPHIN) 1 g in  sodium chloride 0.9 % 100 mL IVPB  1 g Intravenous Q24H Edwin Dada, MD   Stopped at 04/15/22 1624   Chlorhexidine Gluconate Cloth 2 % PADS 6 each  6 each Topical Q0600 Cherylann Ratel A, DO   6 each at 04/16/22 0518   cloNIDine (CATAPRES) tablet 0.1 mg  0.1 mg Oral TID PRN Darliss Cheney, MD       doxycycline (VIBRA-TABS) tablet 100 mg  100 mg Oral Q12H Danford, Suann Larry, MD   100 mg at 04/16/22 0932   enoxaparin (LOVENOX) injection 30 mg  30 mg Subcutaneous Q24H Kyle, Tyrone A, DO   30 mg at 04/15/22 2258   feeding supplement (ENSURE ENLIVE / ENSURE PLUS) liquid 237 mL  237 mL Oral TID BM Pahwani, Einar Grad, MD   237 mL at 04/16/22 0932   fluticasone furoate-vilanterol (BREO ELLIPTA) 100-25 MCG/ACT 1 puff  1 puff Inhalation Daily Darliss Cheney, MD   1 puff at 04/16/22 0809   And   umeclidinium bromide (INCRUSE ELLIPTA) 62.5 MCG/ACT 1 puff  1 puff Inhalation Daily Pahwani, Einar Grad, MD   1 puff at 78/24/23 5361   folic acid (FOLVITE) tablet 1 mg  1 mg Oral Daily Kyle, Tyrone A, DO   1 mg at 04/16/22 0932   gabapentin (NEURONTIN) capsule 100 mg  100 mg Oral TID Cherylann Ratel A, DO   100 mg at 04/16/22 0932   guaiFENesin (MUCINEX) 12 hr tablet 600 mg  600 mg Oral BID Marylyn Ishihara, Tyrone A, DO   600 mg at 04/16/22 0932   ipratropium-albuterol (DUONEB) 0.5-2.5 (3) MG/3ML nebulizer solution 3 mL  3 mL Nebulization BID Darliss Cheney, MD   3 mL at 04/16/22 0810   loratadine (CLARITIN) tablet 10 mg  10 mg Oral Daily Kyle, Tyrone A, DO   10 mg at 04/16/22 0932   metoprolol succinate (TOPROL-XL) 24 hr tablet 50 mg  50 mg Oral Daily Kyle, Tyrone A, DO   50 mg at 04/16/22 0932   metoprolol tartrate (LOPRESSOR) injection 10 mg  10 mg Intravenous Q5 min PRN Darliss Cheney, MD       multivitamin with minerals tablet 1 tablet  1 tablet Oral Daily Kyle, Tyrone A, DO   1 tablet at 04/16/22 0932   Oral care mouth rinse  15 mL Mouth Rinse PRN Marylyn Ishihara, Tyrone A, DO       pantoprazole (PROTONIX) EC tablet 40 mg  40 mg Oral  Daily Kyle, Tyrone A, DO   40 mg at 04/16/22 0932   pravastatin (PRAVACHOL) tablet 40 mg  40 mg Oral QPM Kyle, Tyrone A, DO   40 mg at 04/14/22 1817   predniSONE (DELTASONE) tablet 40 mg  40 mg Oral Q breakfast Marylyn Ishihara, Tyrone A, DO   40 mg at 04/16/22 0931   thiamine (VITAMIN B1) tablet 100 mg  100 mg Oral Daily Marylyn Ishihara, Tyrone A, DO   100 mg at 04/16/22 5051   Or   thiamine (VITAMIN B1) injection 100 mg  100 mg Intravenous Daily Cherylann Ratel A, DO         Discharge Medications: Please see discharge summary for a list of discharge medications.  Relevant Imaging Results:  Relevant Lab Results:   Additional Information ssn:[id number] Severity Reaction Type Reactions Comments Ace Inhibitors High Allergy Swelling  Lisinopril High Allergy Swelling REACTION: Angioedema Valsartan High Allergy Swelling REACTION: angioedema.  Pt should not get ACEI or ARBs of any kind due to angioedema. Aspirin Medium Intolerance Other (See Comments) REACTION: GI ulcer. Penicillins  Leeroy Cha, RN

## 2022-04-17 ENCOUNTER — Other Ambulatory Visit (HOSPITAL_COMMUNITY): Payer: Self-pay

## 2022-04-17 DIAGNOSIS — G9341 Metabolic encephalopathy: Secondary | ICD-10-CM | POA: Diagnosis not present

## 2022-04-17 DIAGNOSIS — Z789 Other specified health status: Secondary | ICD-10-CM | POA: Diagnosis not present

## 2022-04-17 DIAGNOSIS — J441 Chronic obstructive pulmonary disease with (acute) exacerbation: Secondary | ICD-10-CM | POA: Diagnosis not present

## 2022-04-17 DIAGNOSIS — J9611 Chronic respiratory failure with hypoxia: Secondary | ICD-10-CM | POA: Diagnosis not present

## 2022-04-17 LAB — COMPREHENSIVE METABOLIC PANEL
ALT: 17 U/L (ref 0–44)
AST: 26 U/L (ref 15–41)
Albumin: 2.9 g/dL — ABNORMAL LOW (ref 3.5–5.0)
Alkaline Phosphatase: 83 U/L (ref 38–126)
Anion gap: 11 (ref 5–15)
BUN: 14 mg/dL (ref 8–23)
CO2: 31 mmol/L (ref 22–32)
Calcium: 8.9 mg/dL (ref 8.9–10.3)
Chloride: 92 mmol/L — ABNORMAL LOW (ref 98–111)
Creatinine, Ser: 0.63 mg/dL (ref 0.44–1.00)
GFR, Estimated: >60 mL/min (ref >60)
Glucose, Bld: 80 mg/dL (ref 70–99)
Potassium: 3.8 mmol/L (ref 3.5–5.1)
Sodium: 134 mmol/L — ABNORMAL LOW (ref 135–145)
Total Bilirubin: 0.5 mg/dL (ref 0.3–1.2)
Total Protein: 7.2 g/dL (ref 6.5–8.1)

## 2022-04-17 LAB — CBC
HCT: 36 % (ref 36.0–46.0)
Hemoglobin: 11.7 g/dL — ABNORMAL LOW (ref 12.0–15.0)
MCH: 27.3 pg (ref 26.0–34.0)
MCHC: 32.5 g/dL (ref 30.0–36.0)
MCV: 84.1 fL (ref 80.0–100.0)
Platelets: 279 10*3/uL (ref 150–400)
RBC: 4.28 MIL/uL (ref 3.87–5.11)
RDW: 17.4 % — ABNORMAL HIGH (ref 11.5–15.5)
WBC: 7.3 10*3/uL (ref 4.0–10.5)
nRBC: 0 % (ref 0.0–0.2)

## 2022-04-17 LAB — GLUCOSE, CAPILLARY
Glucose-Capillary: 70 mg/dL (ref 70–99)
Glucose-Capillary: 74 mg/dL (ref 70–99)
Glucose-Capillary: 85 mg/dL (ref 70–99)

## 2022-04-17 MED ORDER — ALBUTEROL SULFATE (2.5 MG/3ML) 0.083% IN NEBU
2.5000 mg | INHALATION_SOLUTION | Freq: Four times a day (QID) | RESPIRATORY_TRACT | 2 refills | Status: AC | PRN
  Filled 2022-04-17: qty 90, 7d supply, fill #0
  Filled 2022-04-17: qty 90, 8d supply, fill #0

## 2022-04-17 MED ORDER — GUAIFENESIN ER 600 MG PO TB12: 600.0000 mg | ORAL_TABLET | Freq: Two times a day (BID) | ORAL | Status: DC

## 2022-04-17 MED ORDER — DOXYCYCLINE HYCLATE 100 MG PO TABS
100.0000 mg | ORAL_TABLET | Freq: Two times a day (BID) | ORAL | 0 refills | Status: DC
  Filled 2022-04-17: qty 8, 4d supply, fill #0

## 2022-04-17 MED ORDER — BENZONATATE 100 MG PO CAPS
100.0000 mg | ORAL_CAPSULE | Freq: Four times a day (QID) | ORAL | 0 refills | Status: DC | PRN
  Filled 2022-04-17: qty 30, 8d supply, fill #0

## 2022-04-17 MED ORDER — CEFPODOXIME PROXETIL 200 MG PO TABS
200.0000 mg | ORAL_TABLET | Freq: Two times a day (BID) | ORAL | 0 refills | Status: DC
  Filled 2022-04-17: qty 8, 4d supply, fill #0

## 2022-04-17 MED ORDER — PREDNISONE 10 MG PO TABS
ORAL_TABLET | ORAL | 0 refills | Status: DC
  Filled 2022-04-17: qty 40, 16d supply, fill #0

## 2022-04-17 MED ORDER — FLUTICASONE-SALMETEROL 500-50 MCG/ACT IN AEPB
1.0000 | INHALATION_SPRAY | Freq: Two times a day (BID) | RESPIRATORY_TRACT | 2 refills | Status: DC
  Filled 2022-04-17: qty 60, 30d supply, fill #0

## 2022-04-17 NOTE — Telephone Encounter (Signed)
I have not received any form from [employer] on this patient

## 2022-04-17 NOTE — [place] Notes (Signed)
Physical Therapy Treatment Patient Details Name: AKYAH LAGRANGE MRN: [id number] DOB: 09/15/1945 Today's Date: 04/17/2022   History of Present Illness Pt is 76 yo female admitted 04/14/22 with sepsis due to PNE and resp failure.  Pt also with ETOH withdrawl symptoms, malnutrition, and hypoglycemia.  Pt with hx of COPD, chronic hypoxic respiratory failure, HTN, sarcoidosis.    PT Comments    Patient progressing well with mobility. Supervision for bed mobility and guarding for transfers with pt demonstrating good carryover for safe hand placement with RW. Assist needed to negotiate tight space in bathroom for pt but pt maintained safe proximity to RW and no LOB noted. Recommend ST rehab, will progress as able.    Recommendations for follow up therapy are one component of a multi-disciplinary discharge planning process, led by the attending physician.  Recommendations may be updated based on patient status, additional functional criteria and insurance authorization.  Follow Up Recommendations  Skilled nursing-short term rehab (<3 hours/day) Can patient physically be transported by private vehicle: Yes   Assistance Recommended at Discharge Frequent or constant Supervision/Assistance  Patient can return home with the following A little help with walking and/or transfers;A little help with bathing/dressing/bathroom;Assistance with cooking/housework;Help with stairs or ramp for entrance   Equipment Recommendations  None recommended by PT    Recommendations for Other Services       Precautions / Restrictions Precautions Precautions: Fall Precaution Comments: orthostatic bp Restrictions Weight Bearing Restrictions: No     Mobility  Bed Mobility Overal bed mobility: Needs Assistance Bed Mobility: Supine to Sit     Supine to sit: Supervision, HOB elevated     General bed mobility comments: sup for safety, pt taking extra time    Transfers Overall transfer level: Needs  assistance Equipment used: Rolling walker (2 wheels) Transfers: Sit to/from Stand Sit to Stand: Min guard           General transfer comment: guard to rise from EOB and toilet with RW, pt using bil UE's on walker to push up    Ambulation/Gait Ambulation/Gait assistance: Min assist Gait Distance (Feet): 30 Feet Assistive device: Rolling walker (2 wheels) Gait Pattern/deviations: Step-to pattern, Decreased stride length, Trunk flexed Gait velocity: decreased     General Gait Details: min assist/guard for safety with ambulation to bathroom and back to recliner. no overt LOB, pt slightly dizzy, but recovered.   Stairs             Wheelchair Mobility    Modified Rankin (Stroke Patients Only)       Balance Overall balance assessment: Needs assistance Sitting-balance support: No upper extremity supported Sitting balance-Leahy Scale: Fair     Standing balance support: Bilateral upper extremity supported, Reliant on assistive device for balance Standing balance-Leahy Scale: Poor                              Cognition Arousal/Alertness: Awake/alert Behavior During Therapy: WFL for tasks assessed/performed Overall Cognitive Status: No family/caregiver present to determine baseline cognitive functioning                                 General Comments: Overall cognition WFL, some decreased safety awareness/awareness of deficits        Exercises      General Comments        Pertinent Vitals/Pain      Home Living  Prior Function            PT Goals (current goals can now be found in the care plan section) Acute Rehab PT Goals Patient Stated Goal: return home - states has had aides in past PT Goal Formulation: With patient Time For Goal Achievement: 04/30/22 Potential to Achieve Goals: Good Progress towards PT goals: Progressing toward goals    Frequency    Min 3X/week      PT Plan  Current plan remains appropriate    Co-evaluation              AM-PAC PT "6 Clicks" Mobility   Outcome Measure  Help needed turning from your back to your side while in a flat bed without using bedrails?: A Little Help needed moving from lying on your back to sitting on the side of a flat bed without using bedrails?: A Little Help needed moving to and from a bed to a chair (including a wheelchair)?: A Little Help needed standing up from a chair using your arms (e.g., wheelchair or bedside chair)?: A Little Help needed to walk in hospital room?: A Little Help needed climbing 3-5 steps with a railing? : A Lot 6 Click Score: 17    End of Session Equipment Utilized During Treatment: Gait belt;Oxygen Activity Tolerance: Patient tolerated treatment well Patient left: in chair;with call bell/phone within reach;with chair alarm set Nurse Communication: Mobility status PT Visit Diagnosis: Other abnormalities of gait and mobility (R26.89);Muscle weakness (generalized) (M62.81);History of falling (Z91.81)     Time: 4536-4680 PT Time Calculation (min) (ACUTE ONLY): 20 min  Charges:  $Gait Training: 8-22 mins                     Verner Mould, DPT Acute Rehabilitation Services Office [phone] Pager [phone]  04/17/22 4:41 PM
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
## 2022-04-17 NOTE — TOC Transition Note (Signed)
Transition of Care [facility]) - CM/SW Discharge Note   Patient Details  Name: [name] MRN: [id number] Date of Birth: [birth year]  [facility] CM/SW Contact:  Vassie Moselle, LCSW Phone Number: 04/17/2022, 10:07 AM   Clinical Narrative:    Pt and daughter do not want pt to go to SNF and prefer pt to return home with [facility]. Pt has had HH in the past however, unsure of waht agency was used. Pt has been set up with HHPT/OT through [facility]. Pt also needing travel tank for O2. Pt receives her home O2 through [employer]. Travel tank has been ordered through [employer] and will be delivered to pt's room prior to discharge.    Final next level of care: [place] Barriers to Discharge: Barriers Resolved   Patient Goals and CMS Choice Patient states their goals for this hospitalization and ongoing recovery are:: to go home CMS Medicare.gov Compare Post Acute Care list provided to:: Patient Choice offered to / list presented to : Adult Children, Patient  Discharge Placement                  Name of family member notified: [name] Patient and family notified of of transfer: 04/17/22  Discharge Plan and Services   Discharge Planning Services: CM Consult            DME Arranged: Oxygen DME Agency: [employer] Date DME Agency Contacted: 04/17/22 Time DME Agency Contacted: 69 Representative spoke with at DME Agency: Andee Poles HH Arranged: PT, OT [place] Agency: [facility] Date [place]: 04/17/22 Time [place]: 1007 Representative spoke with at [place]: [place] Determinants of Health ([place]) Interventions     Readmission Risk Interventions    04/17/2022   10:06 AM  Readmission Risk Prevention Plan  Transportation Screening Complete  PCP or Specialist Appt within 5-7 Days Complete  Home Care Screening Complete  Medication Review (RN CM) Complete

## 2022-04-18 ENCOUNTER — Telehealth: Payer: Self-pay

## 2022-04-18 DIAGNOSIS — J449 Chronic obstructive pulmonary disease, unspecified: Secondary | ICD-10-CM | POA: Diagnosis not present

## 2022-04-18 NOTE — Patient Outreach (Signed)
  Care Coordination TOC Note Transition Care Management Follow-up Telephone Call Date of discharge and from where: [facility] 04/14/22-04/17/22 How have you been since you were released from the hospital? Per patients daughter who lives with her mom, patient is doing well.  She ate all her breakfast and took her medications. Any questions or concerns? No  Items Reviewed: Did the pt receive and understand the discharge instructions provided? Yes  Medications obtained and verified? Yes  Other? No  Any new allergies since your discharge? No  Dietary orders reviewed? Yes Do you have support at home? Yes   Home Care and Equipment/Supplies: Were home health services ordered? yes If so, what is the name of the agency? [facility]  Has the agency set up a time to come to the patient's home? no Were any new equipment or medical supplies ordered?  Yes: Oxygen What is the name of the medical supply agency? [employer] Were you able to get the supplies/equipment? yes Do you have any questions related to the use of the equipment or supplies? No  Functional Questionnaire: (I = Independent and D = Dependent) ADLs: D  Bathing/Dressing- D  Meal Prep- D  Eating- I  Maintaining continence- I  Transferring/Ambulation- D  Managing Meds- D  Follow up appointments reviewed:  PCP Hospital f/u appt confirmed? Yes  Scheduled to see Dr. Jerilee Hoh on 04/28/22 @ 2:00. [place] [facility] f/u appt confirmed? Yes  Scheduled to see Dr. Ander Slade on 05/07/22 @ 3:15. Are transportation arrangements needed? No  If their condition worsens, is the pt aware to call PCP or go to the Emergency Dept.? Yes Was the patient provided with contact information for the PCP's office or ED? Yes Was to pt encouraged to call back with questions or concerns? Yes  SDOH assessments and interventions completed:   Yes  Care Coordination Interventions Activated:  Yes   Care Coordination Interventions:   No interventions needed at this time      Encounter Outcome:  Pt. Visit Completed

## 2022-04-19 LAB — CULTURE, BLOOD (ROUTINE X 2)
Culture: NO GROWTH
Culture: NO GROWTH

## 2022-04-20 DIAGNOSIS — H5461 Unqualified visual loss, right eye, normal vision left eye: Secondary | ICD-10-CM | POA: Diagnosis not present

## 2022-04-20 DIAGNOSIS — J9611 Chronic respiratory failure with hypoxia: Secondary | ICD-10-CM | POA: Diagnosis not present

## 2022-04-20 DIAGNOSIS — E875 Hyperkalemia: Secondary | ICD-10-CM | POA: Diagnosis not present

## 2022-04-20 DIAGNOSIS — G319 Degenerative disease of nervous system, unspecified: Secondary | ICD-10-CM | POA: Diagnosis not present

## 2022-04-20 DIAGNOSIS — E440 Moderate protein-calorie malnutrition: Secondary | ICD-10-CM | POA: Diagnosis not present

## 2022-04-20 DIAGNOSIS — J8410 Pulmonary fibrosis, unspecified: Secondary | ICD-10-CM | POA: Diagnosis not present

## 2022-04-20 DIAGNOSIS — E871 Hypo-osmolality and hyponatremia: Secondary | ICD-10-CM | POA: Diagnosis not present

## 2022-04-20 DIAGNOSIS — Z7951 Long term (current) use of inhaled steroids: Secondary | ICD-10-CM | POA: Diagnosis not present

## 2022-04-20 DIAGNOSIS — Z79899 Other long term (current) drug therapy: Secondary | ICD-10-CM | POA: Diagnosis not present

## 2022-04-20 DIAGNOSIS — J439 Emphysema, unspecified: Secondary | ICD-10-CM | POA: Diagnosis not present

## 2022-04-20 DIAGNOSIS — J690 Pneumonitis due to inhalation of food and vomit: Secondary | ICD-10-CM | POA: Diagnosis not present

## 2022-04-20 DIAGNOSIS — K219 Gastro-esophageal reflux disease without esophagitis: Secondary | ICD-10-CM | POA: Diagnosis not present

## 2022-04-20 DIAGNOSIS — I10 Essential (primary) hypertension: Secondary | ICD-10-CM | POA: Diagnosis not present

## 2022-04-20 DIAGNOSIS — A419 Sepsis, unspecified organism: Secondary | ICD-10-CM | POA: Diagnosis not present

## 2022-04-20 DIAGNOSIS — Z9981 Dependence on supplemental oxygen: Secondary | ICD-10-CM | POA: Diagnosis not present

## 2022-04-20 DIAGNOSIS — M24451 Recurrent dislocation, right hip: Secondary | ICD-10-CM | POA: Diagnosis not present

## 2022-04-20 DIAGNOSIS — M810 Age-related osteoporosis without current pathological fracture: Secondary | ICD-10-CM | POA: Diagnosis not present

## 2022-04-20 DIAGNOSIS — E785 Hyperlipidemia, unspecified: Secondary | ICD-10-CM | POA: Diagnosis not present

## 2022-04-20 DIAGNOSIS — D869 Sarcoidosis, unspecified: Secondary | ICD-10-CM | POA: Diagnosis not present

## 2022-04-20 DIAGNOSIS — Z87891 Personal history of nicotine dependence: Secondary | ICD-10-CM | POA: Diagnosis not present

## 2022-04-20 DIAGNOSIS — Z7952 Long term (current) use of systemic steroids: Secondary | ICD-10-CM | POA: Diagnosis not present

## 2022-04-20 DIAGNOSIS — H269 Unspecified cataract: Secondary | ICD-10-CM | POA: Diagnosis not present

## 2022-04-20 DIAGNOSIS — F32A Depression, unspecified: Secondary | ICD-10-CM | POA: Diagnosis not present

## 2022-04-20 DIAGNOSIS — I471 Supraventricular tachycardia: Secondary | ICD-10-CM | POA: Diagnosis not present

## 2022-04-22 DIAGNOSIS — A419 Sepsis, unspecified organism: Secondary | ICD-10-CM | POA: Diagnosis not present

## 2022-04-22 DIAGNOSIS — E875 Hyperkalemia: Secondary | ICD-10-CM | POA: Diagnosis not present

## 2022-04-22 DIAGNOSIS — J8410 Pulmonary fibrosis, unspecified: Secondary | ICD-10-CM | POA: Diagnosis not present

## 2022-04-22 DIAGNOSIS — Z7952 Long term (current) use of systemic steroids: Secondary | ICD-10-CM | POA: Diagnosis not present

## 2022-04-22 DIAGNOSIS — J9611 Chronic respiratory failure with hypoxia: Secondary | ICD-10-CM | POA: Diagnosis not present

## 2022-04-22 DIAGNOSIS — E440 Moderate protein-calorie malnutrition: Secondary | ICD-10-CM | POA: Diagnosis not present

## 2022-04-22 DIAGNOSIS — M810 Age-related osteoporosis without current pathological fracture: Secondary | ICD-10-CM | POA: Diagnosis not present

## 2022-04-22 DIAGNOSIS — D869 Sarcoidosis, unspecified: Secondary | ICD-10-CM | POA: Diagnosis not present

## 2022-04-22 DIAGNOSIS — Z9981 Dependence on supplemental oxygen: Secondary | ICD-10-CM | POA: Diagnosis not present

## 2022-04-22 DIAGNOSIS — M24451 Recurrent dislocation, right hip: Secondary | ICD-10-CM | POA: Diagnosis not present

## 2022-04-22 DIAGNOSIS — K219 Gastro-esophageal reflux disease without esophagitis: Secondary | ICD-10-CM | POA: Diagnosis not present

## 2022-04-22 DIAGNOSIS — E785 Hyperlipidemia, unspecified: Secondary | ICD-10-CM | POA: Diagnosis not present

## 2022-04-22 DIAGNOSIS — Z79899 Other long term (current) drug therapy: Secondary | ICD-10-CM | POA: Diagnosis not present

## 2022-04-22 DIAGNOSIS — I471 Supraventricular tachycardia: Secondary | ICD-10-CM | POA: Diagnosis not present

## 2022-04-22 DIAGNOSIS — E871 Hypo-osmolality and hyponatremia: Secondary | ICD-10-CM | POA: Diagnosis not present

## 2022-04-22 DIAGNOSIS — J690 Pneumonitis due to inhalation of food and vomit: Secondary | ICD-10-CM | POA: Diagnosis not present

## 2022-04-22 DIAGNOSIS — I10 Essential (primary) hypertension: Secondary | ICD-10-CM | POA: Diagnosis not present

## 2022-04-22 DIAGNOSIS — Z87891 Personal history of nicotine dependence: Secondary | ICD-10-CM | POA: Diagnosis not present

## 2022-04-22 DIAGNOSIS — Z7951 Long term (current) use of inhaled steroids: Secondary | ICD-10-CM | POA: Diagnosis not present

## 2022-04-22 DIAGNOSIS — G319 Degenerative disease of nervous system, unspecified: Secondary | ICD-10-CM | POA: Diagnosis not present

## 2022-04-22 DIAGNOSIS — H269 Unspecified cataract: Secondary | ICD-10-CM | POA: Diagnosis not present

## 2022-04-22 DIAGNOSIS — F32A Depression, unspecified: Secondary | ICD-10-CM | POA: Diagnosis not present

## 2022-04-22 DIAGNOSIS — J439 Emphysema, unspecified: Secondary | ICD-10-CM | POA: Diagnosis not present

## 2022-04-22 DIAGNOSIS — H5461 Unqualified visual loss, right eye, normal vision left eye: Secondary | ICD-10-CM | POA: Diagnosis not present

## 2022-04-25 ENCOUNTER — Telehealth: Payer: Self-pay | Admitting: Pulmonary Disease

## 2022-04-25 DIAGNOSIS — E871 Hypo-osmolality and hyponatremia: Secondary | ICD-10-CM | POA: Diagnosis not present

## 2022-04-25 DIAGNOSIS — Z7952 Long term (current) use of systemic steroids: Secondary | ICD-10-CM | POA: Diagnosis not present

## 2022-04-25 DIAGNOSIS — J439 Emphysema, unspecified: Secondary | ICD-10-CM | POA: Diagnosis not present

## 2022-04-25 DIAGNOSIS — F32A Depression, unspecified: Secondary | ICD-10-CM | POA: Diagnosis not present

## 2022-04-25 DIAGNOSIS — H269 Unspecified cataract: Secondary | ICD-10-CM | POA: Diagnosis not present

## 2022-04-25 DIAGNOSIS — D869 Sarcoidosis, unspecified: Secondary | ICD-10-CM | POA: Diagnosis not present

## 2022-04-25 DIAGNOSIS — Z79899 Other long term (current) drug therapy: Secondary | ICD-10-CM | POA: Diagnosis not present

## 2022-04-25 DIAGNOSIS — Z9981 Dependence on supplemental oxygen: Secondary | ICD-10-CM | POA: Diagnosis not present

## 2022-04-25 DIAGNOSIS — E875 Hyperkalemia: Secondary | ICD-10-CM | POA: Diagnosis not present

## 2022-04-25 DIAGNOSIS — M810 Age-related osteoporosis without current pathological fracture: Secondary | ICD-10-CM | POA: Diagnosis not present

## 2022-04-25 DIAGNOSIS — E440 Moderate protein-calorie malnutrition: Secondary | ICD-10-CM | POA: Diagnosis not present

## 2022-04-25 DIAGNOSIS — J9611 Chronic respiratory failure with hypoxia: Secondary | ICD-10-CM | POA: Diagnosis not present

## 2022-04-25 DIAGNOSIS — E785 Hyperlipidemia, unspecified: Secondary | ICD-10-CM | POA: Diagnosis not present

## 2022-04-25 DIAGNOSIS — G319 Degenerative disease of nervous system, unspecified: Secondary | ICD-10-CM | POA: Diagnosis not present

## 2022-04-25 DIAGNOSIS — I10 Essential (primary) hypertension: Secondary | ICD-10-CM | POA: Diagnosis not present

## 2022-04-25 DIAGNOSIS — Z7951 Long term (current) use of inhaled steroids: Secondary | ICD-10-CM | POA: Diagnosis not present

## 2022-04-25 DIAGNOSIS — K219 Gastro-esophageal reflux disease without esophagitis: Secondary | ICD-10-CM | POA: Diagnosis not present

## 2022-04-25 DIAGNOSIS — M24451 Recurrent dislocation, right hip: Secondary | ICD-10-CM | POA: Diagnosis not present

## 2022-04-25 DIAGNOSIS — A419 Sepsis, unspecified organism: Secondary | ICD-10-CM | POA: Diagnosis not present

## 2022-04-25 DIAGNOSIS — J690 Pneumonitis due to inhalation of food and vomit: Secondary | ICD-10-CM | POA: Diagnosis not present

## 2022-04-25 DIAGNOSIS — H5461 Unqualified visual loss, right eye, normal vision left eye: Secondary | ICD-10-CM | POA: Diagnosis not present

## 2022-04-25 DIAGNOSIS — I471 Supraventricular tachycardia: Secondary | ICD-10-CM | POA: Diagnosis not present

## 2022-04-25 DIAGNOSIS — Z87891 Personal history of nicotine dependence: Secondary | ICD-10-CM | POA: Diagnosis not present

## 2022-04-25 DIAGNOSIS — J8410 Pulmonary fibrosis, unspecified: Secondary | ICD-10-CM | POA: Diagnosis not present

## 2022-04-28 ENCOUNTER — Encounter: Payer: Self-pay | Admitting: Internal Medicine

## 2022-04-28 ENCOUNTER — Ambulatory Visit (INDEPENDENT_AMBULATORY_CARE_PROVIDER_SITE_OTHER): Payer: Medicare Other | Admitting: Internal Medicine

## 2022-04-28 VITALS — BP 96/50 | HR 92 | Temp 98.5°F | Ht 63.0 in | Wt 98.4 lb

## 2022-04-28 DIAGNOSIS — M24451 Recurrent dislocation, right hip: Secondary | ICD-10-CM | POA: Diagnosis not present

## 2022-04-28 DIAGNOSIS — Z09 Encounter for follow-up examination after completed treatment for conditions other than malignant neoplasm: Secondary | ICD-10-CM

## 2022-04-28 DIAGNOSIS — E162 Hypoglycemia, unspecified: Secondary | ICD-10-CM | POA: Diagnosis not present

## 2022-04-28 DIAGNOSIS — A419 Sepsis, unspecified organism: Secondary | ICD-10-CM | POA: Diagnosis not present

## 2022-04-28 DIAGNOSIS — Z23 Encounter for immunization: Secondary | ICD-10-CM | POA: Diagnosis not present

## 2022-04-28 DIAGNOSIS — G9341 Metabolic encephalopathy: Secondary | ICD-10-CM | POA: Diagnosis not present

## 2022-04-28 DIAGNOSIS — Z789 Other specified health status: Secondary | ICD-10-CM

## 2022-04-28 DIAGNOSIS — E785 Hyperlipidemia, unspecified: Secondary | ICD-10-CM | POA: Diagnosis not present

## 2022-04-28 DIAGNOSIS — E875 Hyperkalemia: Secondary | ICD-10-CM | POA: Diagnosis not present

## 2022-04-28 DIAGNOSIS — K219 Gastro-esophageal reflux disease without esophagitis: Secondary | ICD-10-CM | POA: Diagnosis not present

## 2022-04-28 DIAGNOSIS — J9611 Chronic respiratory failure with hypoxia: Secondary | ICD-10-CM | POA: Diagnosis not present

## 2022-04-28 DIAGNOSIS — Z87891 Personal history of nicotine dependence: Secondary | ICD-10-CM | POA: Diagnosis not present

## 2022-04-28 DIAGNOSIS — Z79899 Other long term (current) drug therapy: Secondary | ICD-10-CM | POA: Diagnosis not present

## 2022-04-28 DIAGNOSIS — Z7951 Long term (current) use of inhaled steroids: Secondary | ICD-10-CM | POA: Diagnosis not present

## 2022-04-28 DIAGNOSIS — E871 Hypo-osmolality and hyponatremia: Secondary | ICD-10-CM | POA: Diagnosis not present

## 2022-04-28 DIAGNOSIS — J8410 Pulmonary fibrosis, unspecified: Secondary | ICD-10-CM | POA: Diagnosis not present

## 2022-04-28 DIAGNOSIS — Z7952 Long term (current) use of systemic steroids: Secondary | ICD-10-CM | POA: Diagnosis not present

## 2022-04-28 DIAGNOSIS — H269 Unspecified cataract: Secondary | ICD-10-CM | POA: Diagnosis not present

## 2022-04-28 DIAGNOSIS — E440 Moderate protein-calorie malnutrition: Secondary | ICD-10-CM | POA: Diagnosis not present

## 2022-04-28 DIAGNOSIS — J690 Pneumonitis due to inhalation of food and vomit: Secondary | ICD-10-CM | POA: Diagnosis not present

## 2022-04-28 DIAGNOSIS — Z9981 Dependence on supplemental oxygen: Secondary | ICD-10-CM | POA: Diagnosis not present

## 2022-04-28 DIAGNOSIS — J439 Emphysema, unspecified: Secondary | ICD-10-CM | POA: Diagnosis not present

## 2022-04-28 DIAGNOSIS — I471 Supraventricular tachycardia: Secondary | ICD-10-CM | POA: Diagnosis not present

## 2022-04-28 DIAGNOSIS — D869 Sarcoidosis, unspecified: Secondary | ICD-10-CM | POA: Diagnosis not present

## 2022-04-28 DIAGNOSIS — G319 Degenerative disease of nervous system, unspecified: Secondary | ICD-10-CM | POA: Diagnosis not present

## 2022-04-28 DIAGNOSIS — M810 Age-related osteoporosis without current pathological fracture: Secondary | ICD-10-CM | POA: Diagnosis not present

## 2022-04-28 DIAGNOSIS — I10 Essential (primary) hypertension: Secondary | ICD-10-CM | POA: Diagnosis not present

## 2022-04-28 DIAGNOSIS — F32A Depression, unspecified: Secondary | ICD-10-CM | POA: Diagnosis not present

## 2022-04-28 DIAGNOSIS — H5461 Unqualified visual loss, right eye, normal vision left eye: Secondary | ICD-10-CM | POA: Diagnosis not present

## 2022-04-28 NOTE — Progress Notes (Signed)
Established Patient Office Visit     CC/Reason for Visit: Hospital follow-up  HPI: [name] is a 76 y.o. female who is coming in today for the above mentioned reasons.  She was hospitalized from 04/14/2022-04/17/2022 after being found down at home lethargic.  Her daughter is present today and explains the sequence of events.  This was after Labor Day weekend and she had had significant amounts of alcohol.  She found her confused at home that progressed throughout the day.  They ended up calling 911 at midnight where CBG was found to be 20.  She was subsequently found to have aspiration pneumonia as well.  She was discharged on cefpodoxime and doxycycline.  She has completed antibiotics, she is back to baseline mentation.  She has not had any further alcohol.   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   Depression 06/08/2007   Essential hypertension, benign 06/08/2007   Family history of breast cancer    GERD 01/18/2009   Osteoporosis 12/31/2012   Left femur had T score -2.5 on DEXA scan 02/26/2009.    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   Vitamin D deficiency     Past Surgical History:  Procedure Laterality Date   CATARACT EXTRACTION Right    COLONOSCOPY  2002   HIP ARTHROPLASTY Right    HIP CLOSED REDUCTION Right 04/08/2021   Procedure: CLOSED REDUCTION HIP;  Surgeon: Mcarthur Rossetti, MD;  Location: [facility];  Service: Orthopedics;  Laterality: Right;   HIP CLOSED REDUCTION Right 09/15/2021   Procedure: CLOSED REDUCTION HIP;  Surgeon: Melina Schools, MD;  Location: [facility];  Service: Orthopedics;  Laterality: Right;   TUBAL LIGATION       Social History:  reports that she quit smoking about 11 years ago. Her smoking use included cigarettes. She has a 7.50 pack-year smoking history. She has never used smokeless tobacco. She reports current alcohol use. She reports that she does not use drugs.  Allergies: Allergies  Allergen Reactions   Ace Inhibitors Swelling   Lisinopril Swelling    REACTION: Angioedema   Valsartan Swelling    REACTION: angioedema.  Pt should not get ACEI or ARBs of any kind due to angioedema.   Aspirin Other (See Comments)    REACTION: GI ulcer.   Penicillins     Childhood allergy Has patient had a PCN reaction causing immediate rash, facial/tongue/throat swelling, SOB or lightheadedness with hypotension: Unknown Has patient had a PCN reaction causing severe rash involving mucus membranes or skin necrosis: Unknown Has patient had a PCN reaction that required hospitalization: Unknown Has patient had a PCN reaction occurring within the last 10 years: Unknown If all of the above answers are "NO", then may proceed with Cephalosporin use.     Family History:  Family History  Problem Relation Age of Onset   Heart disease Mother    Breast cancer Mother 23   Stroke Father    Heart disease Sister    Asthma Daughter    Cancer Other    Diabetes Other    Hypertension Other    COPD Brother        "  9/11 survivor"   COPD Sister        "9/11 survivor"   Breast cancer Maternal Grandmother        dx in her 14s   Breast cancer Other        MGMs mother died in her 58s   Colon cancer Neg Hx      Current Outpatient Medications:    acetaminophen (TYLENOL) 500 MG tablet, Take 1,000 mg by mouth every 6 (six) hours as needed for moderate pain., Disp: , Rfl:    albuterol (PROVENTIL) (2.5 MG/3ML) 0.083% nebulizer solution, Inhale 3 mLs (2.5 mg total) by nebulization every 6 (six) hours as needed for wheezing., Disp: 180 mL, Rfl: 2   albuterol (VENTOLIN HFA) 108 (90 Base) MCG/ACT inhaler, INHALE TWO PUFFS  EVERY 6 HOURS AS NEEDED FOR WHEEZING (Patient taking differently: Inhale 2 puffs into the lungs every 6 (six) hours as needed for wheezing. INHALE TWO PUFFS EVERY 6 HOURS AS NEEDED FOR WHEEZING), Disp: 8.5 g, Rfl: 19   amLODipine (NORVASC) 5 MG tablet, Take 1 tablet (5 mg total) by mouth daily., Disp: 90 tablet, Rfl: 1   benzonatate (TESSALON PERLES) 100 MG capsule, Take 1 capsule (100 mg total) by mouth every 6 (six) hours as needed for cough., Disp: 30 capsule, Rfl: 0   cefpodoxime (VANTIN) 200 MG tablet, Take 1 tablet (200 mg total) by mouth 2 (two) times daily., Disp: 8 tablet, Rfl: 0   cyanocobalamin (,VITAMIN B-12,) 1000 MCG/ML injection, Inject 29m in deltoid once weekly for 4 weeks, then inject 1 ml once a month thereafter, Disp: 6 mL, Rfl: 3   famotidine (PEPCID) 20 MG tablet, TAKE ONE TABLET BY MOUTH TWICE DAILY, Disp: 60 tablet, Rfl: 5   fluticasone-salmeterol (WIXELA INHUB) 500-50 MCG/ACT AEPB, Inhale 1 puff into the lungs in the morning and at bedtime., Disp: 60 each, Rfl: 2   gabapentin (NEURONTIN) 100 MG capsule, TAKE ONE TABLET BY MOUTH THREE TIMES DAILY (Patient taking differently: Take 100 mg by mouth 3 (three) times daily.), Disp: 90 capsule, Rfl: 1   guaiFENesin (MUCINEX) 600 MG 12 hr tablet, Take 1 tablet (600 mg total) by mouth 2 (two) times daily., Disp: , Rfl:    hydrochlorothiazide (HYDRODIURIL) 25 MG tablet, TAKE ONE-HALF TO 1 TABLET BY MOUTH DAILY AS NEEDED FOR SWELLING (Patient taking differently: Take 12.5 mg by mouth daily as needed (for swelling).), Disp: 45 tablet, Rfl: 1   loratadine (CLARITIN) 10 MG tablet, Take 1 tablet (10 mg total) by mouth daily., Disp: 90 tablet, Rfl: 1   Magnesium 500 MG TABS, Take 1 tablet (500 mg total) by mouth at bedtime., Disp: 90 tablet, Rfl: 1   melatonin 5 MG TABS, Take 2 tablets (10 mg total) by mouth at bedtime., Disp: 60 tablet, Rfl: 5   metoprolol succinate (TOPROL-XL) 50 MG 24 hr tablet, TAKE ONE TABLET BY MOUTH DAILY WITH OR  IMMEDIATELY FOLLOWING A MEAL (Patient taking differently: Take 50 mg by mouth daily. TAKE ONE TABLET BY MOUTH DAILY WITH OR IMMEDIATELY FOLLOWING A MEAL), Disp: 30 tablet, Rfl: 5   mirtazapine (REMERON) 15 MG tablet, TAKE ONE TABLET BY MOUTH AT BEDTIME (Patient taking differently: Take 15 mg by mouth at bedtime.), Disp: 30 tablet, Rfl: 11   pantoprazole (PROTONIX) 40 MG tablet, Take 1 tablet (40 mg total) by mouth daily., Disp: 90 tablet, Rfl: 0   pravastatin (PRAVACHOL) 40 MG tablet, Take 1 tablet (40 mg total) by mouth every evening., Disp: 90 tablet, Rfl: 1  predniSONE (DELTASONE) 10 MG tablet, Take 4 tablets by mouth daily for 4 days. Then take 3 tabs daily for 4 days. Then 2 tabs dailiy for 4 days. Then 1 tab daily for 4 days then stop, Disp: 40 tablet, Rfl: 0   SYRINGE-NEEDLE, DISP, 3 ML (BD SAFETYGLIDE SYRINGE/NEEDLE) 25G X 1" 3 ML MISC, Use for B12 injections, Disp: 100 each, Rfl: 11   doxycycline (VIBRA-TABS) 100 MG tablet, Take 1 tablet (100 mg total) by mouth every 12 (twelve) hours. (Patient not taking: Reported on 04/28/2022), Disp: 8 tablet, Rfl: 0   Vitamin D, Ergocalciferol, (DRISDOL) 1.25 MG (50000 UNIT) CAPS capsule, TAKE ONE CAPSULE BY MOUTH EVERY WEEK (Patient not taking: Reported on 04/14/2022), Disp: 12 capsule, Rfl: 11  Review of Systems:  Constitutional: Denies fever, chills, diaphoresis, appetite change and fatigue.  HEENT: Denies photophobia, eye pain, redness, hearing loss, ear pain, congestion, sore throat, rhinorrhea, sneezing, mouth sores, trouble swallowing, neck pain, neck stiffness and tinnitus.   Respiratory: Denies SOB, DOE, cough, chest tightness,  and wheezing.   Cardiovascular: Denies chest pain, palpitations and leg swelling.  Gastrointestinal: Denies nausea, vomiting, abdominal pain, diarrhea, constipation, blood in stool and abdominal distention.  Genitourinary: Denies dysuria, urgency, frequency, hematuria, flank pain and difficulty urinating.  Endocrine:  Denies: hot or cold intolerance, sweats, changes in hair or nails, polyuria, polydipsia. Musculoskeletal: Denies myalgias, back pain, joint swelling, arthralgias and gait problem.  Skin: Denies pallor, rash and wound.  Neurological: Denies dizziness, seizures, syncope, weakness, light-headedness, numbness and headaches.  Hematological: Denies adenopathy. Easy bruising, personal or family bleeding history  Psychiatric/Behavioral: Denies suicidal ideation, mood changes, confusion, nervousness, sleep disturbance and agitation    Physical Exam: Vitals:   04/28/22 1357  BP: (!) 96/50  Pulse: 92  Temp: 98.5 F (36.9 C)  SpO2: 96%  Weight: 98 lb 6.4 oz (44.6 kg)  Height: '5\' 3"'$  (1.6 m)    Body mass index is 17.43 kg/m.   Constitutional: NAD, calm, comfortable Eyes: PERRL, lids and conjunctivae normal ENMT: Mucous membranes are moist.  Respiratory: clear to auscultation bilaterally, no wheezing, no crackles. Normal respiratory effort. No accessory muscle use.  Cardiovascular: Regular rate and rhythm, no murmurs / rubs / gallops. No extremity edema. Psychiatric: Normal judgment and insight. Alert and oriented x 3. Normal mood.    Impression and Plan:  Hospital discharge follow-up  Hypoglycemia  Acute metabolic encephalopathy  Pulmonary fibrosis (HCC)  Alcohol use  Aspiration pneumonia due to gastric secretions, unspecified laterality, unspecified part of lung ([place])  Need for influenza vaccination  [facility] charts have been reviewed in great detail. -Medication reconciliation has been performed. -She has completed antibiotic therapy. -She has had no further alcohol intake since discharge. -She has follow-up scheduled with her pulmonologist. -I will see her back in 3 months. -Flu vaccine administered today.  Time spent:33 minutes reviewing chart, interviewing and examining patient and formulating plan of care.    Lelon Frohlich, MD  Primary Care at  [facility]

## 2022-04-28 NOTE — Telephone Encounter (Signed)
Printed off office notes and sent to [employer]. Nothing further needed

## 2022-04-29 ENCOUNTER — Other Ambulatory Visit: Payer: Self-pay | Admitting: Internal Medicine

## 2022-04-29 DIAGNOSIS — J441 Chronic obstructive pulmonary disease with (acute) exacerbation: Secondary | ICD-10-CM

## 2022-04-30 DIAGNOSIS — K219 Gastro-esophageal reflux disease without esophagitis: Secondary | ICD-10-CM | POA: Diagnosis not present

## 2022-04-30 DIAGNOSIS — G319 Degenerative disease of nervous system, unspecified: Secondary | ICD-10-CM | POA: Diagnosis not present

## 2022-04-30 DIAGNOSIS — E875 Hyperkalemia: Secondary | ICD-10-CM | POA: Diagnosis not present

## 2022-04-30 DIAGNOSIS — Z87891 Personal history of nicotine dependence: Secondary | ICD-10-CM | POA: Diagnosis not present

## 2022-04-30 DIAGNOSIS — Z79899 Other long term (current) drug therapy: Secondary | ICD-10-CM | POA: Diagnosis not present

## 2022-04-30 DIAGNOSIS — F32A Depression, unspecified: Secondary | ICD-10-CM | POA: Diagnosis not present

## 2022-04-30 DIAGNOSIS — J9611 Chronic respiratory failure with hypoxia: Secondary | ICD-10-CM | POA: Diagnosis not present

## 2022-04-30 DIAGNOSIS — E440 Moderate protein-calorie malnutrition: Secondary | ICD-10-CM | POA: Diagnosis not present

## 2022-04-30 DIAGNOSIS — J439 Emphysema, unspecified: Secondary | ICD-10-CM | POA: Diagnosis not present

## 2022-04-30 DIAGNOSIS — Z7952 Long term (current) use of systemic steroids: Secondary | ICD-10-CM | POA: Diagnosis not present

## 2022-04-30 DIAGNOSIS — E871 Hypo-osmolality and hyponatremia: Secondary | ICD-10-CM | POA: Diagnosis not present

## 2022-04-30 DIAGNOSIS — J8410 Pulmonary fibrosis, unspecified: Secondary | ICD-10-CM | POA: Diagnosis not present

## 2022-04-30 DIAGNOSIS — E785 Hyperlipidemia, unspecified: Secondary | ICD-10-CM | POA: Diagnosis not present

## 2022-04-30 DIAGNOSIS — J690 Pneumonitis due to inhalation of food and vomit: Secondary | ICD-10-CM | POA: Diagnosis not present

## 2022-04-30 DIAGNOSIS — Z7951 Long term (current) use of inhaled steroids: Secondary | ICD-10-CM | POA: Diagnosis not present

## 2022-04-30 DIAGNOSIS — Z9981 Dependence on supplemental oxygen: Secondary | ICD-10-CM | POA: Diagnosis not present

## 2022-04-30 DIAGNOSIS — I10 Essential (primary) hypertension: Secondary | ICD-10-CM | POA: Diagnosis not present

## 2022-04-30 DIAGNOSIS — M810 Age-related osteoporosis without current pathological fracture: Secondary | ICD-10-CM | POA: Diagnosis not present

## 2022-04-30 DIAGNOSIS — I471 Supraventricular tachycardia: Secondary | ICD-10-CM | POA: Diagnosis not present

## 2022-04-30 DIAGNOSIS — H269 Unspecified cataract: Secondary | ICD-10-CM | POA: Diagnosis not present

## 2022-04-30 DIAGNOSIS — H5461 Unqualified visual loss, right eye, normal vision left eye: Secondary | ICD-10-CM | POA: Diagnosis not present

## 2022-04-30 DIAGNOSIS — A419 Sepsis, unspecified organism: Secondary | ICD-10-CM | POA: Diagnosis not present

## 2022-04-30 DIAGNOSIS — D869 Sarcoidosis, unspecified: Secondary | ICD-10-CM | POA: Diagnosis not present

## 2022-04-30 DIAGNOSIS — M24451 Recurrent dislocation, right hip: Secondary | ICD-10-CM | POA: Diagnosis not present

## 2022-05-05 ENCOUNTER — Telehealth: Payer: Self-pay | Admitting: Pulmonary Disease

## 2022-05-06 DIAGNOSIS — J9611 Chronic respiratory failure with hypoxia: Secondary | ICD-10-CM | POA: Diagnosis not present

## 2022-05-06 DIAGNOSIS — E785 Hyperlipidemia, unspecified: Secondary | ICD-10-CM | POA: Diagnosis not present

## 2022-05-06 DIAGNOSIS — Z79899 Other long term (current) drug therapy: Secondary | ICD-10-CM | POA: Diagnosis not present

## 2022-05-06 DIAGNOSIS — E871 Hypo-osmolality and hyponatremia: Secondary | ICD-10-CM | POA: Diagnosis not present

## 2022-05-06 DIAGNOSIS — A419 Sepsis, unspecified organism: Secondary | ICD-10-CM | POA: Diagnosis not present

## 2022-05-06 DIAGNOSIS — M810 Age-related osteoporosis without current pathological fracture: Secondary | ICD-10-CM | POA: Diagnosis not present

## 2022-05-06 DIAGNOSIS — J439 Emphysema, unspecified: Secondary | ICD-10-CM | POA: Diagnosis not present

## 2022-05-06 DIAGNOSIS — J8410 Pulmonary fibrosis, unspecified: Secondary | ICD-10-CM | POA: Diagnosis not present

## 2022-05-06 DIAGNOSIS — Z87891 Personal history of nicotine dependence: Secondary | ICD-10-CM | POA: Diagnosis not present

## 2022-05-06 DIAGNOSIS — K219 Gastro-esophageal reflux disease without esophagitis: Secondary | ICD-10-CM | POA: Diagnosis not present

## 2022-05-06 DIAGNOSIS — D869 Sarcoidosis, unspecified: Secondary | ICD-10-CM | POA: Diagnosis not present

## 2022-05-06 DIAGNOSIS — I471 Supraventricular tachycardia: Secondary | ICD-10-CM | POA: Diagnosis not present

## 2022-05-06 DIAGNOSIS — E875 Hyperkalemia: Secondary | ICD-10-CM | POA: Diagnosis not present

## 2022-05-06 DIAGNOSIS — Z7952 Long term (current) use of systemic steroids: Secondary | ICD-10-CM | POA: Diagnosis not present

## 2022-05-06 DIAGNOSIS — I10 Essential (primary) hypertension: Secondary | ICD-10-CM | POA: Diagnosis not present

## 2022-05-06 DIAGNOSIS — Z9981 Dependence on supplemental oxygen: Secondary | ICD-10-CM | POA: Diagnosis not present

## 2022-05-06 DIAGNOSIS — F32A Depression, unspecified: Secondary | ICD-10-CM | POA: Diagnosis not present

## 2022-05-06 DIAGNOSIS — Z7951 Long term (current) use of inhaled steroids: Secondary | ICD-10-CM | POA: Diagnosis not present

## 2022-05-06 DIAGNOSIS — M24451 Recurrent dislocation, right hip: Secondary | ICD-10-CM | POA: Diagnosis not present

## 2022-05-06 DIAGNOSIS — G319 Degenerative disease of nervous system, unspecified: Secondary | ICD-10-CM | POA: Diagnosis not present

## 2022-05-06 DIAGNOSIS — H269 Unspecified cataract: Secondary | ICD-10-CM | POA: Diagnosis not present

## 2022-05-06 DIAGNOSIS — H5461 Unqualified visual loss, right eye, normal vision left eye: Secondary | ICD-10-CM | POA: Diagnosis not present

## 2022-05-06 DIAGNOSIS — J690 Pneumonitis due to inhalation of food and vomit: Secondary | ICD-10-CM | POA: Diagnosis not present

## 2022-05-06 DIAGNOSIS — E440 Moderate protein-calorie malnutrition: Secondary | ICD-10-CM | POA: Diagnosis not present

## 2022-05-06 NOTE — Telephone Encounter (Signed)
Janett Billow with [employer] requested OV notes that state patient is using O2.  LOV notes and vital signs printed with O2 settings faxed to [place] attention Janett Billow [phone]. Confirmation received.  Nothing further at this time.

## 2022-05-07 ENCOUNTER — Ambulatory Visit (INDEPENDENT_AMBULATORY_CARE_PROVIDER_SITE_OTHER): Payer: Medicare Other | Admitting: Pulmonary Disease

## 2022-05-07 ENCOUNTER — Other Ambulatory Visit: Payer: Self-pay | Admitting: Internal Medicine

## 2022-05-07 ENCOUNTER — Encounter: Payer: Self-pay | Admitting: Pulmonary Disease

## 2022-05-07 VITALS — BP 108/64 | HR 78 | Ht 63.0 in | Wt 97.6 lb

## 2022-05-07 DIAGNOSIS — D869 Sarcoidosis, unspecified: Secondary | ICD-10-CM | POA: Diagnosis not present

## 2022-05-07 DIAGNOSIS — Z87891 Personal history of nicotine dependence: Secondary | ICD-10-CM | POA: Diagnosis not present

## 2022-05-07 DIAGNOSIS — E785 Hyperlipidemia, unspecified: Secondary | ICD-10-CM | POA: Diagnosis not present

## 2022-05-07 DIAGNOSIS — R0602 Shortness of breath: Secondary | ICD-10-CM | POA: Diagnosis not present

## 2022-05-07 DIAGNOSIS — F32A Depression, unspecified: Secondary | ICD-10-CM | POA: Diagnosis not present

## 2022-05-07 DIAGNOSIS — J439 Emphysema, unspecified: Secondary | ICD-10-CM | POA: Diagnosis not present

## 2022-05-07 DIAGNOSIS — I10 Essential (primary) hypertension: Secondary | ICD-10-CM | POA: Diagnosis not present

## 2022-05-07 DIAGNOSIS — J9611 Chronic respiratory failure with hypoxia: Secondary | ICD-10-CM

## 2022-05-07 DIAGNOSIS — Z7951 Long term (current) use of inhaled steroids: Secondary | ICD-10-CM | POA: Diagnosis not present

## 2022-05-07 DIAGNOSIS — J8410 Pulmonary fibrosis, unspecified: Secondary | ICD-10-CM | POA: Diagnosis not present

## 2022-05-07 DIAGNOSIS — Z9981 Dependence on supplemental oxygen: Secondary | ICD-10-CM | POA: Diagnosis not present

## 2022-05-07 DIAGNOSIS — K219 Gastro-esophageal reflux disease without esophagitis: Secondary | ICD-10-CM | POA: Diagnosis not present

## 2022-05-07 DIAGNOSIS — Z79899 Other long term (current) drug therapy: Secondary | ICD-10-CM | POA: Diagnosis not present

## 2022-05-07 DIAGNOSIS — Z7952 Long term (current) use of systemic steroids: Secondary | ICD-10-CM | POA: Diagnosis not present

## 2022-05-07 DIAGNOSIS — I471 Supraventricular tachycardia: Secondary | ICD-10-CM | POA: Diagnosis not present

## 2022-05-07 DIAGNOSIS — J431 Panlobular emphysema: Secondary | ICD-10-CM | POA: Diagnosis not present

## 2022-05-07 DIAGNOSIS — A419 Sepsis, unspecified organism: Secondary | ICD-10-CM | POA: Diagnosis not present

## 2022-05-07 DIAGNOSIS — E440 Moderate protein-calorie malnutrition: Secondary | ICD-10-CM | POA: Diagnosis not present

## 2022-05-07 DIAGNOSIS — E875 Hyperkalemia: Secondary | ICD-10-CM | POA: Diagnosis not present

## 2022-05-07 DIAGNOSIS — M24451 Recurrent dislocation, right hip: Secondary | ICD-10-CM | POA: Diagnosis not present

## 2022-05-07 DIAGNOSIS — M810 Age-related osteoporosis without current pathological fracture: Secondary | ICD-10-CM | POA: Diagnosis not present

## 2022-05-07 DIAGNOSIS — G319 Degenerative disease of nervous system, unspecified: Secondary | ICD-10-CM | POA: Diagnosis not present

## 2022-05-07 DIAGNOSIS — H5461 Unqualified visual loss, right eye, normal vision left eye: Secondary | ICD-10-CM | POA: Diagnosis not present

## 2022-05-07 DIAGNOSIS — J690 Pneumonitis due to inhalation of food and vomit: Secondary | ICD-10-CM | POA: Diagnosis not present

## 2022-05-07 DIAGNOSIS — E871 Hypo-osmolality and hyponatremia: Secondary | ICD-10-CM | POA: Diagnosis not present

## 2022-05-07 DIAGNOSIS — H269 Unspecified cataract: Secondary | ICD-10-CM | POA: Diagnosis not present

## 2022-05-07 NOTE — Patient Instructions (Signed)
Continue oxygen use around-the-clock  Continue nebulizer use  Call us with any significant concerns  We will send follow-up notes to [employer] regarding oxygen supplementation  Activities as tolerated

## 2022-05-07 NOTE — Progress Notes (Signed)
[name]    [id number]    17-Sep-1945  Primary Care Physician:Hernandez Everardo Beals, MD  Referring Physician: Isaac Bliss, Rayford Halsted, MD [address] [place],  [place] [address]  Chief complaint:  Patient with a history of chronic obstructive pulmonary disease, chronic shortness of breath History of sarcoidosis Recent hospitalization for hypoglycemia  HPI:   Difficulty breathing Recently treated in the hospital for hypoglycemia, COPD exacerbation  She is feeling much better Uses nebulization treatments Uses incentive spirometer  She does have a cough with minimal sputum production  Limited with activities of daily living  Most recent CT changes does show nodular changes at the bases of the lungs worse in the upper part of the lungs, calcified adenopathy. CT scan in the hospital recently in September does not show any significant changes per my review  She does have a new nebulizer unit which she has been using  Does have a history of COPD, history of sarcoidosis with scarring  COPD diagnosis about 2003 History of sarcoidosis about the same time-did use a chronic course of steroids back then  Breathing is difficult especially with weather changes  She did have some swelling of the legs when she was recently in the hospital It was recommended that she follow-up with cardiology as well  Occupation: No pertinent history Smoking history: Reformed smoker   Outpatient Encounter Medications as of 05/07/2022  Medication Sig   acetaminophen (TYLENOL) 500 MG tablet Take 1,000 mg by mouth every 6 (six) hours as needed for moderate pain.   albuterol (PROVENTIL) (2.5 MG/3ML) 0.083% nebulizer solution Inhale 3 mLs (2.5 mg total) by nebulization every 6 (six) hours as needed for wheezing.   albuterol (VENTOLIN HFA) 108 (90 Base) MCG/ACT inhaler INHALE TWO PUFFS EVERY 6 HOURS AS NEEDED FOR WHEEZING (Patient taking differently: Inhale 2 puffs into  the lungs every 6 (six) hours as needed for wheezing. INHALE TWO PUFFS EVERY 6 HOURS AS NEEDED FOR WHEEZING)   amLODipine (NORVASC) 5 MG tablet Take 1 tablet (5 mg total) by mouth daily.   benzonatate (TESSALON PERLES) 100 MG capsule Take 1 capsule (100 mg total) by mouth every 6 (six) hours as needed for cough.   cefpodoxime (VANTIN) 200 MG tablet Take 1 tablet (200 mg total) by mouth 2 (two) times daily.   cyanocobalamin (,VITAMIN B-12,) 1000 MCG/ML injection Inject 30m in deltoid once weekly for 4 weeks, then inject 1 ml once a month thereafter   famotidine (PEPCID) 20 MG tablet TAKE ONE TABLET BY MOUTH TWICE DAILY   fluticasone-salmeterol (WIXELA INHUB) 500-50 MCG/ACT AEPB Inhale 1 puff into the lungs in the morning and at bedtime.   gabapentin (NEURONTIN) 100 MG capsule TAKE ONE TABLET BY MOUTH THREE TIMES DAILY (Patient taking differently: Take 100 mg by mouth 3 (three) times daily.)   guaiFENesin (MUCINEX) 600 MG 12 hr tablet Take 1 tablet (600 mg total) by mouth 2 (two) times daily.   hydrochlorothiazide (HYDRODIURIL) 25 MG tablet TAKE ONE-HALF TO 1 TABLET BY MOUTH DAILY AS NEEDED FOR SWELLING (Patient taking differently: Take 12.5 mg by mouth daily as needed (for swelling).)   loratadine (CLARITIN) 10 MG tablet Take 1 tablet (10 mg total) by mouth daily.   Magnesium 500 MG TABS Take 1 tablet (500 mg total) by mouth at bedtime.   melatonin 5 MG TABS Take 2 tablets (10 mg total) by mouth at bedtime.   metoprolol succinate (TOPROL-XL) 50 MG 24 hr  tablet TAKE ONE TABLET BY MOUTH DAILY WITH OR IMMEDIATELY FOLLOWING A MEAL (Patient taking differently: Take 50 mg by mouth daily. TAKE ONE TABLET BY MOUTH DAILY WITH OR IMMEDIATELY FOLLOWING A MEAL)   mirtazapine (REMERON) 15 MG tablet TAKE ONE TABLET BY MOUTH AT BEDTIME (Patient taking differently: Take 15 mg by mouth at bedtime.)   pantoprazole (PROTONIX) 40 MG tablet Take 1 tablet (40 mg total) by mouth daily.   pravastatin (PRAVACHOL) 40 MG tablet  Take 1 tablet (40 mg total) by mouth every evening.   SYRINGE-NEEDLE, DISP, 3 ML (BD SAFETYGLIDE SYRINGE/NEEDLE) 25G X 1" 3 ML MISC Use for B12 injections   Vitamin D, Ergocalciferol, (DRISDOL) 1.25 MG (50000 UNIT) CAPS capsule TAKE ONE CAPSULE BY MOUTH EVERY WEEK   [DISCONTINUED] doxycycline (VIBRA-TABS) 100 MG tablet Take 1 tablet (100 mg total) by mouth every 12 (twelve) hours.   [DISCONTINUED] predniSONE (DELTASONE) 10 MG tablet Take 4 tablets by mouth daily for 4 days. Then take 3 tabs daily for 4 days. Then 2 tabs dailiy for 4 days. Then 1 tab daily for 4 days then stop   No facility-administered encounter medications on file as of 05/07/2022.    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   Depression 06/08/2007   Essential hypertension, benign 06/08/2007   Family history of breast cancer    GERD 01/18/2009   Osteoporosis 12/31/2012   Left femur had T score -2.5 on DEXA scan 02/26/2009.    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   Vitamin D deficiency     Past Surgical History:  Procedure Laterality Date   CATARACT EXTRACTION Right    COLONOSCOPY  2002   HIP ARTHROPLASTY Right    HIP CLOSED REDUCTION Right 04/08/2021   Procedure: CLOSED REDUCTION HIP;  Surgeon: Mcarthur Rossetti, MD;  Location: [facility];  Service: Orthopedics;  Laterality: Right;   HIP CLOSED REDUCTION Right 09/15/2021   Procedure: CLOSED REDUCTION HIP;  Surgeon: Melina Schools, MD;  Location:  [facility];  Service: Orthopedics;  Laterality: Right;   TUBAL LIGATION      Family History  Problem Relation Age of Onset   Heart disease Mother    Breast cancer Mother 68   Stroke Father    Heart disease Sister    Asthma Daughter    Cancer Other    Diabetes Other    Hypertension Other    COPD Brother        "9/11 survivor"   COPD Sister        "9/11 survivor"   Breast cancer Maternal Grandmother        dx in her 67s   Breast cancer Other        MGMs mother died in her 67s   Colon cancer Neg Hx     Social History   Socioeconomic History   Marital status: Widowed    Spouse name: Not  on file   Number of children: Not on file   Years of education: Not on file   Highest education level: Not on file  Occupational History   Not on file  Tobacco Use   Smoking status: Former    Packs/day: 0.25    Years: 30.00    Total pack years: 7.50    Types: Cigarettes    Quit date: 05/11/2010    Years since quitting: 11.9   Smokeless tobacco: Never  Vaping Use   Vaping Use: Never used  Substance and Sexual Activity   Alcohol use: Yes    Alcohol/week: 0.0 standard drinks of alcohol    Comment: wine cooler.   Drug use: No   Sexual activity: Not on file  Other Topics Concern   Not on file  Social History Narrative   Married with 4 children. Retired, previously worked as a Scientist, clinical (histocompatibility and immunogenetics).   Social Determinants of Health   Financial Resource Strain: Not on file  Food Insecurity: Not on file  Transportation Needs: No Transportation Needs (04/18/2022)   PRAPARE - Hydrologist (Medical): No    Lack of Transportation (Non-Medical): No  Physical Activity: Not on file  Stress: Not on file  Social Connections: Not on file  Intimate Partner Violence: Not on file    Review of Systems  Constitutional:  Positive for appetite change and unexpected weight change.  HENT: Negative.    Eyes: Negative.   Respiratory:  Positive for cough and shortness of  breath.   Cardiovascular:  Positive for leg swelling.  Gastrointestinal: Negative.   Endocrine: Negative.   Genitourinary: Negative.   Musculoskeletal: Negative.     Vitals:   05/07/22 1512 05/07/22 1516  BP: 108/64   Pulse: 78   SpO2: 99% 94%     Physical Exam Constitutional:      Appearance: She is well-developed. She is not diaphoretic.  HENT:     Head: Normocephalic and atraumatic.  Eyes:     General:        Right eye: No discharge.        Left eye: No discharge.  Neck:     Thyroid: No thyromegaly.     Trachea: No tracheal deviation.  Cardiovascular:     Rate and Rhythm: Normal rate and regular rhythm.     Heart sounds: No murmur heard.    No friction rub.  Pulmonary:     Effort: Pulmonary effort is normal. No respiratory distress.     Breath sounds: No stridor. Rhonchi and rales present. No wheezing.  Musculoskeletal:     Cervical back: No rigidity or tenderness.  Neurological:     Mental Status: She is alert.     Deep Tendon Reflexes: Reflexes are normal and symmetric.  Psychiatric:        Mood and Affect: Mood normal.     Data Reviewed: Last chest x-ray on record was in 2013-infiltrative process biapically  CT scan reviewed with the patient-most recent CT scan 10/25/2021 -Concerned with nodular change at the base -Most recent CT scan reviewed 12/10/2021 showing extensive fibrotic changes -CT scan 04/14/2022 reviewed by myself showing stable findings, extensive fibrotic changes  CT scan as far back as 2011 was reviewed Chronic fibrotic changes  Assessment:   Advanced chronic obstructive pulmonary disease -No PFT on record -Continues to maintain on inhalers  Recent hospitalization for hypoglycemic episode  History of sarcoidosis -Recent CT scan noted -She does have a repeat CT ordered -Follow-up  CT shows stable fibrotic process  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

## 2022-05-08 DIAGNOSIS — Z7952 Long term (current) use of systemic steroids: Secondary | ICD-10-CM | POA: Diagnosis not present

## 2022-05-08 DIAGNOSIS — J8410 Pulmonary fibrosis, unspecified: Secondary | ICD-10-CM | POA: Diagnosis not present

## 2022-05-08 DIAGNOSIS — E440 Moderate protein-calorie malnutrition: Secondary | ICD-10-CM | POA: Diagnosis not present

## 2022-05-08 DIAGNOSIS — F32A Depression, unspecified: Secondary | ICD-10-CM | POA: Diagnosis not present

## 2022-05-08 DIAGNOSIS — A419 Sepsis, unspecified organism: Secondary | ICD-10-CM | POA: Diagnosis not present

## 2022-05-08 DIAGNOSIS — J439 Emphysema, unspecified: Secondary | ICD-10-CM | POA: Diagnosis not present

## 2022-05-08 DIAGNOSIS — H269 Unspecified cataract: Secondary | ICD-10-CM | POA: Diagnosis not present

## 2022-05-08 DIAGNOSIS — Z9981 Dependence on supplemental oxygen: Secondary | ICD-10-CM | POA: Diagnosis not present

## 2022-05-08 DIAGNOSIS — M24451 Recurrent dislocation, right hip: Secondary | ICD-10-CM | POA: Diagnosis not present

## 2022-05-08 DIAGNOSIS — E875 Hyperkalemia: Secondary | ICD-10-CM | POA: Diagnosis not present

## 2022-05-08 DIAGNOSIS — I471 Supraventricular tachycardia: Secondary | ICD-10-CM | POA: Diagnosis not present

## 2022-05-08 DIAGNOSIS — M810 Age-related osteoporosis without current pathological fracture: Secondary | ICD-10-CM | POA: Diagnosis not present

## 2022-05-08 DIAGNOSIS — J690 Pneumonitis due to inhalation of food and vomit: Secondary | ICD-10-CM | POA: Diagnosis not present

## 2022-05-08 DIAGNOSIS — Z87891 Personal history of nicotine dependence: Secondary | ICD-10-CM | POA: Diagnosis not present

## 2022-05-08 DIAGNOSIS — E785 Hyperlipidemia, unspecified: Secondary | ICD-10-CM | POA: Diagnosis not present

## 2022-05-08 DIAGNOSIS — Z79899 Other long term (current) drug therapy: Secondary | ICD-10-CM | POA: Diagnosis not present

## 2022-05-08 DIAGNOSIS — Z7951 Long term (current) use of inhaled steroids: Secondary | ICD-10-CM | POA: Diagnosis not present

## 2022-05-08 DIAGNOSIS — H5461 Unqualified visual loss, right eye, normal vision left eye: Secondary | ICD-10-CM | POA: Diagnosis not present

## 2022-05-08 DIAGNOSIS — I10 Essential (primary) hypertension: Secondary | ICD-10-CM | POA: Diagnosis not present

## 2022-05-08 DIAGNOSIS — D869 Sarcoidosis, unspecified: Secondary | ICD-10-CM | POA: Diagnosis not present

## 2022-05-08 DIAGNOSIS — E871 Hypo-osmolality and hyponatremia: Secondary | ICD-10-CM | POA: Diagnosis not present

## 2022-05-08 DIAGNOSIS — J9611 Chronic respiratory failure with hypoxia: Secondary | ICD-10-CM | POA: Diagnosis not present

## 2022-05-08 DIAGNOSIS — K219 Gastro-esophageal reflux disease without esophagitis: Secondary | ICD-10-CM | POA: Diagnosis not present

## 2022-05-08 DIAGNOSIS — G319 Degenerative disease of nervous system, unspecified: Secondary | ICD-10-CM | POA: Diagnosis not present

## 2022-05-10 DIAGNOSIS — J449 Chronic obstructive pulmonary disease, unspecified: Secondary | ICD-10-CM | POA: Diagnosis not present

## 2022-05-12 DIAGNOSIS — E875 Hyperkalemia: Secondary | ICD-10-CM | POA: Diagnosis not present

## 2022-05-12 DIAGNOSIS — K219 Gastro-esophageal reflux disease without esophagitis: Secondary | ICD-10-CM | POA: Diagnosis not present

## 2022-05-12 DIAGNOSIS — Z79899 Other long term (current) drug therapy: Secondary | ICD-10-CM | POA: Diagnosis not present

## 2022-05-12 DIAGNOSIS — E871 Hypo-osmolality and hyponatremia: Secondary | ICD-10-CM | POA: Diagnosis not present

## 2022-05-12 DIAGNOSIS — H5461 Unqualified visual loss, right eye, normal vision left eye: Secondary | ICD-10-CM | POA: Diagnosis not present

## 2022-05-12 DIAGNOSIS — M24451 Recurrent dislocation, right hip: Secondary | ICD-10-CM | POA: Diagnosis not present

## 2022-05-12 DIAGNOSIS — J439 Emphysema, unspecified: Secondary | ICD-10-CM | POA: Diagnosis not present

## 2022-05-12 DIAGNOSIS — Z9981 Dependence on supplemental oxygen: Secondary | ICD-10-CM | POA: Diagnosis not present

## 2022-05-12 DIAGNOSIS — I471 Supraventricular tachycardia, unspecified: Secondary | ICD-10-CM | POA: Diagnosis not present

## 2022-05-12 DIAGNOSIS — E785 Hyperlipidemia, unspecified: Secondary | ICD-10-CM | POA: Diagnosis not present

## 2022-05-12 DIAGNOSIS — Z87891 Personal history of nicotine dependence: Secondary | ICD-10-CM | POA: Diagnosis not present

## 2022-05-12 DIAGNOSIS — A419 Sepsis, unspecified organism: Secondary | ICD-10-CM | POA: Diagnosis not present

## 2022-05-12 DIAGNOSIS — D869 Sarcoidosis, unspecified: Secondary | ICD-10-CM | POA: Diagnosis not present

## 2022-05-12 DIAGNOSIS — J690 Pneumonitis due to inhalation of food and vomit: Secondary | ICD-10-CM | POA: Diagnosis not present

## 2022-05-12 DIAGNOSIS — Z7952 Long term (current) use of systemic steroids: Secondary | ICD-10-CM | POA: Diagnosis not present

## 2022-05-12 DIAGNOSIS — M810 Age-related osteoporosis without current pathological fracture: Secondary | ICD-10-CM | POA: Diagnosis not present

## 2022-05-12 DIAGNOSIS — E440 Moderate protein-calorie malnutrition: Secondary | ICD-10-CM | POA: Diagnosis not present

## 2022-05-12 DIAGNOSIS — J9611 Chronic respiratory failure with hypoxia: Secondary | ICD-10-CM | POA: Diagnosis not present

## 2022-05-12 DIAGNOSIS — H269 Unspecified cataract: Secondary | ICD-10-CM | POA: Diagnosis not present

## 2022-05-12 DIAGNOSIS — G319 Degenerative disease of nervous system, unspecified: Secondary | ICD-10-CM | POA: Diagnosis not present

## 2022-05-12 DIAGNOSIS — F32A Depression, unspecified: Secondary | ICD-10-CM | POA: Diagnosis not present

## 2022-05-12 DIAGNOSIS — Z7951 Long term (current) use of inhaled steroids: Secondary | ICD-10-CM | POA: Diagnosis not present

## 2022-05-12 DIAGNOSIS — J8410 Pulmonary fibrosis, unspecified: Secondary | ICD-10-CM | POA: Diagnosis not present

## 2022-05-12 DIAGNOSIS — I10 Essential (primary) hypertension: Secondary | ICD-10-CM | POA: Diagnosis not present

## 2022-05-13 ENCOUNTER — Telehealth: Payer: Self-pay | Admitting: Pulmonary Disease

## 2022-05-14 DIAGNOSIS — M810 Age-related osteoporosis without current pathological fracture: Secondary | ICD-10-CM | POA: Diagnosis not present

## 2022-05-14 DIAGNOSIS — I471 Supraventricular tachycardia, unspecified: Secondary | ICD-10-CM | POA: Diagnosis not present

## 2022-05-14 DIAGNOSIS — K219 Gastro-esophageal reflux disease without esophagitis: Secondary | ICD-10-CM | POA: Diagnosis not present

## 2022-05-14 DIAGNOSIS — E785 Hyperlipidemia, unspecified: Secondary | ICD-10-CM | POA: Diagnosis not present

## 2022-05-14 DIAGNOSIS — J8410 Pulmonary fibrosis, unspecified: Secondary | ICD-10-CM | POA: Diagnosis not present

## 2022-05-14 DIAGNOSIS — Z7952 Long term (current) use of systemic steroids: Secondary | ICD-10-CM | POA: Diagnosis not present

## 2022-05-14 DIAGNOSIS — E440 Moderate protein-calorie malnutrition: Secondary | ICD-10-CM | POA: Diagnosis not present

## 2022-05-14 DIAGNOSIS — E875 Hyperkalemia: Secondary | ICD-10-CM | POA: Diagnosis not present

## 2022-05-14 DIAGNOSIS — F32A Depression, unspecified: Secondary | ICD-10-CM | POA: Diagnosis not present

## 2022-05-14 DIAGNOSIS — H269 Unspecified cataract: Secondary | ICD-10-CM | POA: Diagnosis not present

## 2022-05-14 DIAGNOSIS — M24451 Recurrent dislocation, right hip: Secondary | ICD-10-CM | POA: Diagnosis not present

## 2022-05-14 DIAGNOSIS — I10 Essential (primary) hypertension: Secondary | ICD-10-CM | POA: Diagnosis not present

## 2022-05-14 DIAGNOSIS — J690 Pneumonitis due to inhalation of food and vomit: Secondary | ICD-10-CM | POA: Diagnosis not present

## 2022-05-14 DIAGNOSIS — H5461 Unqualified visual loss, right eye, normal vision left eye: Secondary | ICD-10-CM | POA: Diagnosis not present

## 2022-05-14 DIAGNOSIS — Z87891 Personal history of nicotine dependence: Secondary | ICD-10-CM | POA: Diagnosis not present

## 2022-05-14 DIAGNOSIS — J9611 Chronic respiratory failure with hypoxia: Secondary | ICD-10-CM | POA: Diagnosis not present

## 2022-05-14 DIAGNOSIS — J439 Emphysema, unspecified: Secondary | ICD-10-CM | POA: Diagnosis not present

## 2022-05-14 DIAGNOSIS — D869 Sarcoidosis, unspecified: Secondary | ICD-10-CM | POA: Diagnosis not present

## 2022-05-14 DIAGNOSIS — Z79899 Other long term (current) drug therapy: Secondary | ICD-10-CM | POA: Diagnosis not present

## 2022-05-14 DIAGNOSIS — Z9981 Dependence on supplemental oxygen: Secondary | ICD-10-CM | POA: Diagnosis not present

## 2022-05-14 DIAGNOSIS — G319 Degenerative disease of nervous system, unspecified: Secondary | ICD-10-CM | POA: Diagnosis not present

## 2022-05-14 DIAGNOSIS — E871 Hypo-osmolality and hyponatremia: Secondary | ICD-10-CM | POA: Diagnosis not present

## 2022-05-14 DIAGNOSIS — A419 Sepsis, unspecified organism: Secondary | ICD-10-CM | POA: Diagnosis not present

## 2022-05-14 DIAGNOSIS — Z7951 Long term (current) use of inhaled steroids: Secondary | ICD-10-CM | POA: Diagnosis not present

## 2022-05-14 NOTE — Telephone Encounter (Signed)
[name], please advise if her [employer] form was received, thanks!

## 2022-05-15 DIAGNOSIS — R0602 Shortness of breath: Secondary | ICD-10-CM | POA: Diagnosis not present

## 2022-05-15 DIAGNOSIS — D869 Sarcoidosis, unspecified: Secondary | ICD-10-CM | POA: Diagnosis not present

## 2022-05-16 NOTE — Telephone Encounter (Signed)
Please advise on the CMN for this patient?

## 2022-05-18 DIAGNOSIS — J449 Chronic obstructive pulmonary disease, unspecified: Secondary | ICD-10-CM | POA: Diagnosis not present

## 2022-05-19 NOTE — Telephone Encounter (Signed)
This was faxed back on 05/13/2022 to [employer]

## 2022-05-19 NOTE — Telephone Encounter (Signed)
Noted. Nothing further needed. 

## 2022-05-22 ENCOUNTER — Telehealth: Payer: Self-pay | Admitting: Internal Medicine

## 2022-05-22 DIAGNOSIS — Z7952 Long term (current) use of systemic steroids: Secondary | ICD-10-CM | POA: Diagnosis not present

## 2022-05-22 DIAGNOSIS — Z7951 Long term (current) use of inhaled steroids: Secondary | ICD-10-CM | POA: Diagnosis not present

## 2022-05-22 DIAGNOSIS — J8410 Pulmonary fibrosis, unspecified: Secondary | ICD-10-CM | POA: Diagnosis not present

## 2022-05-22 DIAGNOSIS — J439 Emphysema, unspecified: Secondary | ICD-10-CM | POA: Diagnosis not present

## 2022-05-22 DIAGNOSIS — K219 Gastro-esophageal reflux disease without esophagitis: Secondary | ICD-10-CM | POA: Diagnosis not present

## 2022-05-22 DIAGNOSIS — Z79899 Other long term (current) drug therapy: Secondary | ICD-10-CM | POA: Diagnosis not present

## 2022-05-22 DIAGNOSIS — E875 Hyperkalemia: Secondary | ICD-10-CM | POA: Diagnosis not present

## 2022-05-22 DIAGNOSIS — M810 Age-related osteoporosis without current pathological fracture: Secondary | ICD-10-CM | POA: Diagnosis not present

## 2022-05-22 DIAGNOSIS — D869 Sarcoidosis, unspecified: Secondary | ICD-10-CM | POA: Diagnosis not present

## 2022-05-22 DIAGNOSIS — E871 Hypo-osmolality and hyponatremia: Secondary | ICD-10-CM | POA: Diagnosis not present

## 2022-05-22 DIAGNOSIS — M24451 Recurrent dislocation, right hip: Secondary | ICD-10-CM | POA: Diagnosis not present

## 2022-05-22 DIAGNOSIS — H269 Unspecified cataract: Secondary | ICD-10-CM | POA: Diagnosis not present

## 2022-05-22 DIAGNOSIS — G319 Degenerative disease of nervous system, unspecified: Secondary | ICD-10-CM | POA: Diagnosis not present

## 2022-05-22 DIAGNOSIS — H5461 Unqualified visual loss, right eye, normal vision left eye: Secondary | ICD-10-CM | POA: Diagnosis not present

## 2022-05-22 DIAGNOSIS — A419 Sepsis, unspecified organism: Secondary | ICD-10-CM | POA: Diagnosis not present

## 2022-05-22 DIAGNOSIS — Z87891 Personal history of nicotine dependence: Secondary | ICD-10-CM | POA: Diagnosis not present

## 2022-05-22 DIAGNOSIS — J690 Pneumonitis due to inhalation of food and vomit: Secondary | ICD-10-CM | POA: Diagnosis not present

## 2022-05-22 DIAGNOSIS — I471 Supraventricular tachycardia, unspecified: Secondary | ICD-10-CM | POA: Diagnosis not present

## 2022-05-22 DIAGNOSIS — E785 Hyperlipidemia, unspecified: Secondary | ICD-10-CM | POA: Diagnosis not present

## 2022-05-22 DIAGNOSIS — I10 Essential (primary) hypertension: Secondary | ICD-10-CM | POA: Diagnosis not present

## 2022-05-22 DIAGNOSIS — E440 Moderate protein-calorie malnutrition: Secondary | ICD-10-CM | POA: Diagnosis not present

## 2022-05-22 DIAGNOSIS — J9611 Chronic respiratory failure with hypoxia: Secondary | ICD-10-CM | POA: Diagnosis not present

## 2022-05-22 DIAGNOSIS — F32A Depression, unspecified: Secondary | ICD-10-CM | POA: Diagnosis not present

## 2022-05-22 DIAGNOSIS — Z9981 Dependence on supplemental oxygen: Secondary | ICD-10-CM | POA: Diagnosis not present

## 2022-05-22 NOTE — Telephone Encounter (Signed)
Left a detailed message informing [name] of verbal orders

## 2022-05-22 NOTE — Telephone Encounter (Signed)
[name] OT [facility] is calling and need VO for OT 1x5 to met patient OT goals

## 2022-05-27 DIAGNOSIS — K219 Gastro-esophageal reflux disease without esophagitis: Secondary | ICD-10-CM | POA: Diagnosis not present

## 2022-05-27 DIAGNOSIS — J690 Pneumonitis due to inhalation of food and vomit: Secondary | ICD-10-CM | POA: Diagnosis not present

## 2022-05-27 DIAGNOSIS — Z7952 Long term (current) use of systemic steroids: Secondary | ICD-10-CM | POA: Diagnosis not present

## 2022-05-27 DIAGNOSIS — M810 Age-related osteoporosis without current pathological fracture: Secondary | ICD-10-CM | POA: Diagnosis not present

## 2022-05-27 DIAGNOSIS — H269 Unspecified cataract: Secondary | ICD-10-CM | POA: Diagnosis not present

## 2022-05-27 DIAGNOSIS — J8410 Pulmonary fibrosis, unspecified: Secondary | ICD-10-CM | POA: Diagnosis not present

## 2022-05-27 DIAGNOSIS — Z79899 Other long term (current) drug therapy: Secondary | ICD-10-CM | POA: Diagnosis not present

## 2022-05-27 DIAGNOSIS — I10 Essential (primary) hypertension: Secondary | ICD-10-CM | POA: Diagnosis not present

## 2022-05-27 DIAGNOSIS — Z87891 Personal history of nicotine dependence: Secondary | ICD-10-CM | POA: Diagnosis not present

## 2022-05-27 DIAGNOSIS — D869 Sarcoidosis, unspecified: Secondary | ICD-10-CM | POA: Diagnosis not present

## 2022-05-27 DIAGNOSIS — J439 Emphysema, unspecified: Secondary | ICD-10-CM | POA: Diagnosis not present

## 2022-05-27 DIAGNOSIS — E871 Hypo-osmolality and hyponatremia: Secondary | ICD-10-CM | POA: Diagnosis not present

## 2022-05-27 DIAGNOSIS — H5461 Unqualified visual loss, right eye, normal vision left eye: Secondary | ICD-10-CM | POA: Diagnosis not present

## 2022-05-27 DIAGNOSIS — E785 Hyperlipidemia, unspecified: Secondary | ICD-10-CM | POA: Diagnosis not present

## 2022-05-27 DIAGNOSIS — E875 Hyperkalemia: Secondary | ICD-10-CM | POA: Diagnosis not present

## 2022-05-27 DIAGNOSIS — I471 Supraventricular tachycardia, unspecified: Secondary | ICD-10-CM | POA: Diagnosis not present

## 2022-05-27 DIAGNOSIS — A419 Sepsis, unspecified organism: Secondary | ICD-10-CM | POA: Diagnosis not present

## 2022-05-27 DIAGNOSIS — F32A Depression, unspecified: Secondary | ICD-10-CM | POA: Diagnosis not present

## 2022-05-27 DIAGNOSIS — G319 Degenerative disease of nervous system, unspecified: Secondary | ICD-10-CM | POA: Diagnosis not present

## 2022-05-27 DIAGNOSIS — M24451 Recurrent dislocation, right hip: Secondary | ICD-10-CM | POA: Diagnosis not present

## 2022-05-27 DIAGNOSIS — Z9981 Dependence on supplemental oxygen: Secondary | ICD-10-CM | POA: Diagnosis not present

## 2022-05-27 DIAGNOSIS — E440 Moderate protein-calorie malnutrition: Secondary | ICD-10-CM | POA: Diagnosis not present

## 2022-05-27 DIAGNOSIS — J9611 Chronic respiratory failure with hypoxia: Secondary | ICD-10-CM | POA: Diagnosis not present

## 2022-05-27 DIAGNOSIS — Z7951 Long term (current) use of inhaled steroids: Secondary | ICD-10-CM | POA: Diagnosis not present

## 2022-05-28 DIAGNOSIS — I471 Supraventricular tachycardia, unspecified: Secondary | ICD-10-CM | POA: Diagnosis not present

## 2022-05-28 DIAGNOSIS — E875 Hyperkalemia: Secondary | ICD-10-CM | POA: Diagnosis not present

## 2022-05-28 DIAGNOSIS — J8410 Pulmonary fibrosis, unspecified: Secondary | ICD-10-CM | POA: Diagnosis not present

## 2022-05-28 DIAGNOSIS — J690 Pneumonitis due to inhalation of food and vomit: Secondary | ICD-10-CM | POA: Diagnosis not present

## 2022-05-28 DIAGNOSIS — Z79899 Other long term (current) drug therapy: Secondary | ICD-10-CM | POA: Diagnosis not present

## 2022-05-28 DIAGNOSIS — J439 Emphysema, unspecified: Secondary | ICD-10-CM | POA: Diagnosis not present

## 2022-05-28 DIAGNOSIS — J9611 Chronic respiratory failure with hypoxia: Secondary | ICD-10-CM | POA: Diagnosis not present

## 2022-05-28 DIAGNOSIS — H5461 Unqualified visual loss, right eye, normal vision left eye: Secondary | ICD-10-CM | POA: Diagnosis not present

## 2022-05-28 DIAGNOSIS — M24451 Recurrent dislocation, right hip: Secondary | ICD-10-CM | POA: Diagnosis not present

## 2022-05-28 DIAGNOSIS — Z7951 Long term (current) use of inhaled steroids: Secondary | ICD-10-CM | POA: Diagnosis not present

## 2022-05-28 DIAGNOSIS — M810 Age-related osteoporosis without current pathological fracture: Secondary | ICD-10-CM | POA: Diagnosis not present

## 2022-05-28 DIAGNOSIS — Z9981 Dependence on supplemental oxygen: Secondary | ICD-10-CM | POA: Diagnosis not present

## 2022-05-28 DIAGNOSIS — E440 Moderate protein-calorie malnutrition: Secondary | ICD-10-CM | POA: Diagnosis not present

## 2022-05-28 DIAGNOSIS — G319 Degenerative disease of nervous system, unspecified: Secondary | ICD-10-CM | POA: Diagnosis not present

## 2022-05-28 DIAGNOSIS — E871 Hypo-osmolality and hyponatremia: Secondary | ICD-10-CM | POA: Diagnosis not present

## 2022-05-28 DIAGNOSIS — F32A Depression, unspecified: Secondary | ICD-10-CM | POA: Diagnosis not present

## 2022-05-28 DIAGNOSIS — I10 Essential (primary) hypertension: Secondary | ICD-10-CM | POA: Diagnosis not present

## 2022-05-28 DIAGNOSIS — E785 Hyperlipidemia, unspecified: Secondary | ICD-10-CM | POA: Diagnosis not present

## 2022-05-28 DIAGNOSIS — D869 Sarcoidosis, unspecified: Secondary | ICD-10-CM | POA: Diagnosis not present

## 2022-05-28 DIAGNOSIS — H269 Unspecified cataract: Secondary | ICD-10-CM | POA: Diagnosis not present

## 2022-05-28 DIAGNOSIS — Z87891 Personal history of nicotine dependence: Secondary | ICD-10-CM | POA: Diagnosis not present

## 2022-05-28 DIAGNOSIS — Z7952 Long term (current) use of systemic steroids: Secondary | ICD-10-CM | POA: Diagnosis not present

## 2022-05-28 DIAGNOSIS — A419 Sepsis, unspecified organism: Secondary | ICD-10-CM | POA: Diagnosis not present

## 2022-05-28 DIAGNOSIS — K219 Gastro-esophageal reflux disease without esophagitis: Secondary | ICD-10-CM | POA: Diagnosis not present

## 2022-06-02 ENCOUNTER — Other Ambulatory Visit: Payer: Self-pay | Admitting: Internal Medicine

## 2022-06-02 DIAGNOSIS — Z79899 Other long term (current) drug therapy: Secondary | ICD-10-CM | POA: Diagnosis not present

## 2022-06-02 DIAGNOSIS — E440 Moderate protein-calorie malnutrition: Secondary | ICD-10-CM | POA: Diagnosis not present

## 2022-06-02 DIAGNOSIS — Z7951 Long term (current) use of inhaled steroids: Secondary | ICD-10-CM | POA: Diagnosis not present

## 2022-06-02 DIAGNOSIS — F32A Depression, unspecified: Secondary | ICD-10-CM | POA: Diagnosis not present

## 2022-06-02 DIAGNOSIS — J690 Pneumonitis due to inhalation of food and vomit: Secondary | ICD-10-CM | POA: Diagnosis not present

## 2022-06-02 DIAGNOSIS — E785 Hyperlipidemia, unspecified: Secondary | ICD-10-CM | POA: Diagnosis not present

## 2022-06-02 DIAGNOSIS — J439 Emphysema, unspecified: Secondary | ICD-10-CM | POA: Diagnosis not present

## 2022-06-02 DIAGNOSIS — M810 Age-related osteoporosis without current pathological fracture: Secondary | ICD-10-CM | POA: Diagnosis not present

## 2022-06-02 DIAGNOSIS — E871 Hypo-osmolality and hyponatremia: Secondary | ICD-10-CM | POA: Diagnosis not present

## 2022-06-02 DIAGNOSIS — J8410 Pulmonary fibrosis, unspecified: Secondary | ICD-10-CM | POA: Diagnosis not present

## 2022-06-02 DIAGNOSIS — A419 Sepsis, unspecified organism: Secondary | ICD-10-CM | POA: Diagnosis not present

## 2022-06-02 DIAGNOSIS — K219 Gastro-esophageal reflux disease without esophagitis: Secondary | ICD-10-CM | POA: Diagnosis not present

## 2022-06-02 DIAGNOSIS — I471 Supraventricular tachycardia, unspecified: Secondary | ICD-10-CM | POA: Diagnosis not present

## 2022-06-02 DIAGNOSIS — G319 Degenerative disease of nervous system, unspecified: Secondary | ICD-10-CM | POA: Diagnosis not present

## 2022-06-02 DIAGNOSIS — M24451 Recurrent dislocation, right hip: Secondary | ICD-10-CM | POA: Diagnosis not present

## 2022-06-02 DIAGNOSIS — H269 Unspecified cataract: Secondary | ICD-10-CM | POA: Diagnosis not present

## 2022-06-02 DIAGNOSIS — Z7952 Long term (current) use of systemic steroids: Secondary | ICD-10-CM | POA: Diagnosis not present

## 2022-06-02 DIAGNOSIS — H5461 Unqualified visual loss, right eye, normal vision left eye: Secondary | ICD-10-CM | POA: Diagnosis not present

## 2022-06-02 DIAGNOSIS — Z87891 Personal history of nicotine dependence: Secondary | ICD-10-CM | POA: Diagnosis not present

## 2022-06-02 DIAGNOSIS — Z9981 Dependence on supplemental oxygen: Secondary | ICD-10-CM | POA: Diagnosis not present

## 2022-06-02 DIAGNOSIS — J9611 Chronic respiratory failure with hypoxia: Secondary | ICD-10-CM | POA: Diagnosis not present

## 2022-06-02 DIAGNOSIS — D869 Sarcoidosis, unspecified: Secondary | ICD-10-CM | POA: Diagnosis not present

## 2022-06-02 DIAGNOSIS — I10 Essential (primary) hypertension: Secondary | ICD-10-CM | POA: Diagnosis not present

## 2022-06-02 DIAGNOSIS — E875 Hyperkalemia: Secondary | ICD-10-CM | POA: Diagnosis not present

## 2022-06-04 ENCOUNTER — Telehealth: Payer: Self-pay | Admitting: Internal Medicine

## 2022-06-04 ENCOUNTER — Telehealth: Payer: Self-pay | Admitting: *Deleted

## 2022-06-04 DIAGNOSIS — F32A Depression, unspecified: Secondary | ICD-10-CM | POA: Diagnosis not present

## 2022-06-04 DIAGNOSIS — Z9981 Dependence on supplemental oxygen: Secondary | ICD-10-CM | POA: Diagnosis not present

## 2022-06-04 DIAGNOSIS — H5461 Unqualified visual loss, right eye, normal vision left eye: Secondary | ICD-10-CM | POA: Diagnosis not present

## 2022-06-04 DIAGNOSIS — D869 Sarcoidosis, unspecified: Secondary | ICD-10-CM | POA: Diagnosis not present

## 2022-06-04 DIAGNOSIS — I10 Essential (primary) hypertension: Secondary | ICD-10-CM | POA: Diagnosis not present

## 2022-06-04 DIAGNOSIS — M24451 Recurrent dislocation, right hip: Secondary | ICD-10-CM | POA: Diagnosis not present

## 2022-06-04 DIAGNOSIS — E875 Hyperkalemia: Secondary | ICD-10-CM | POA: Diagnosis not present

## 2022-06-04 DIAGNOSIS — J8410 Pulmonary fibrosis, unspecified: Secondary | ICD-10-CM | POA: Diagnosis not present

## 2022-06-04 DIAGNOSIS — E785 Hyperlipidemia, unspecified: Secondary | ICD-10-CM | POA: Diagnosis not present

## 2022-06-04 DIAGNOSIS — I471 Supraventricular tachycardia, unspecified: Secondary | ICD-10-CM | POA: Diagnosis not present

## 2022-06-04 DIAGNOSIS — J9611 Chronic respiratory failure with hypoxia: Secondary | ICD-10-CM | POA: Diagnosis not present

## 2022-06-04 DIAGNOSIS — K219 Gastro-esophageal reflux disease without esophagitis: Secondary | ICD-10-CM | POA: Diagnosis not present

## 2022-06-04 DIAGNOSIS — G319 Degenerative disease of nervous system, unspecified: Secondary | ICD-10-CM | POA: Diagnosis not present

## 2022-06-04 DIAGNOSIS — M810 Age-related osteoporosis without current pathological fracture: Secondary | ICD-10-CM | POA: Diagnosis not present

## 2022-06-04 DIAGNOSIS — J690 Pneumonitis due to inhalation of food and vomit: Secondary | ICD-10-CM | POA: Diagnosis not present

## 2022-06-04 DIAGNOSIS — Z7952 Long term (current) use of systemic steroids: Secondary | ICD-10-CM | POA: Diagnosis not present

## 2022-06-04 DIAGNOSIS — Z79899 Other long term (current) drug therapy: Secondary | ICD-10-CM | POA: Diagnosis not present

## 2022-06-04 DIAGNOSIS — A419 Sepsis, unspecified organism: Secondary | ICD-10-CM | POA: Diagnosis not present

## 2022-06-04 DIAGNOSIS — Z7951 Long term (current) use of inhaled steroids: Secondary | ICD-10-CM | POA: Diagnosis not present

## 2022-06-04 DIAGNOSIS — Z87891 Personal history of nicotine dependence: Secondary | ICD-10-CM | POA: Diagnosis not present

## 2022-06-04 DIAGNOSIS — E440 Moderate protein-calorie malnutrition: Secondary | ICD-10-CM | POA: Diagnosis not present

## 2022-06-04 DIAGNOSIS — H269 Unspecified cataract: Secondary | ICD-10-CM | POA: Diagnosis not present

## 2022-06-04 DIAGNOSIS — J439 Emphysema, unspecified: Secondary | ICD-10-CM | POA: Diagnosis not present

## 2022-06-04 DIAGNOSIS — E871 Hypo-osmolality and hyponatremia: Secondary | ICD-10-CM | POA: Diagnosis not present

## 2022-06-04 NOTE — Telephone Encounter (Signed)
Cecille Rubin, PT with [facility] [phone]) called stating she is currently at the patient's home and the patient has increased shortness of breath, wheezing upper and lower both lobes, rib cage and diaphragm discomfort on the left side with more shortness of breath than normal.  Cecille Rubin stated she can enter an order for a RRA, which will have a nurse come to the patients home for immediate evaluation.  I informed Dr Sarajane Jews of this information as PCP is out of the office.  Dr Sarajane Jews advised to have the order entered to have the nurse see the patient immediately for evaluation and Cecille Rubin stated she will enter the order.

## 2022-06-04 NOTE — Telephone Encounter (Signed)
error 

## 2022-06-05 ENCOUNTER — Ambulatory Visit (INDEPENDENT_AMBULATORY_CARE_PROVIDER_SITE_OTHER): Payer: Medicare Other | Admitting: Adult Health

## 2022-06-05 ENCOUNTER — Encounter: Payer: Self-pay | Admitting: Adult Health

## 2022-06-05 ENCOUNTER — Ambulatory Visit (INDEPENDENT_AMBULATORY_CARE_PROVIDER_SITE_OTHER): Payer: Medicare Other

## 2022-06-05 VITALS — BP 118/62 | HR 97 | Temp 97.9°F

## 2022-06-05 DIAGNOSIS — D869 Sarcoidosis, unspecified: Secondary | ICD-10-CM

## 2022-06-05 DIAGNOSIS — J449 Chronic obstructive pulmonary disease, unspecified: Secondary | ICD-10-CM | POA: Diagnosis not present

## 2022-06-05 DIAGNOSIS — J8410 Pulmonary fibrosis, unspecified: Secondary | ICD-10-CM | POA: Diagnosis not present

## 2022-06-05 DIAGNOSIS — J9611 Chronic respiratory failure with hypoxia: Secondary | ICD-10-CM

## 2022-06-05 DIAGNOSIS — J479 Bronchiectasis, uncomplicated: Secondary | ICD-10-CM | POA: Diagnosis not present

## 2022-06-05 DIAGNOSIS — R0600 Dyspnea, unspecified: Secondary | ICD-10-CM | POA: Diagnosis not present

## 2022-06-05 DIAGNOSIS — R059 Cough, unspecified: Secondary | ICD-10-CM | POA: Diagnosis not present

## 2022-06-05 DIAGNOSIS — J441 Chronic obstructive pulmonary disease with (acute) exacerbation: Secondary | ICD-10-CM | POA: Diagnosis not present

## 2022-06-05 MED ORDER — PREDNISONE 20 MG PO TABS: 20.0000 mg | ORAL_TABLET | Freq: Every day | ORAL | 0 refills | Status: DC

## 2022-06-05 MED ORDER — DOXYCYCLINE HYCLATE 100 MG PO TABS: 100.0000 mg | ORAL_TABLET | Freq: Two times a day (BID) | ORAL | 0 refills | Status: DC

## 2022-06-05 MED ORDER — IPRATROPIUM BROMIDE 0.02 % IN SOLN: 0.5000 mg | Freq: Two times a day (BID) | RESPIRATORY_TRACT | 12 refills | Status: AC

## 2022-06-05 NOTE — Patient Instructions (Signed)
Doxycycline '100mg'$  Twice daily  for 1 week, take with food.  Mucinex DM Twice daily  As needed  cough/congestion  Prednisone '20mg'$  daily for 5 days  Continue on Oxygen 2.5L/m .  Begin Albuterol /Ipratropium Neb Twice daily   Albuterol inhaler or neb As needed   Follow up with Dr. Ander Slade in 4-6 weeks and As needed   Please contact office for sooner follow up if symptoms do not improve or worsen or seek emergency care

## 2022-06-05 NOTE — Assessment & Plan Note (Signed)
Acute COPD exacerbation.  Patient is prone to recurrent infections.  Chest x-ray today shows no acute process.  Patient has very high symptom burden and at risk for decompensation.  Went over in detail with patient and daughter if symptoms or not improving or worsen will need hospitalization.  She wants to avoid hospitalization if at all possible.  Patient is eating and drinking O2 saturations are 98% on 2 L pulsed oxygen. We will treat with empiric antibiotics and short course of steroids. We will add in scheduled DuoNeb twice daily.  Continue on Shafter  Patient Instructions  Doxycycline '100mg'$  Twice daily  for 1 week, take with food.  Mucinex DM Twice daily  As needed  cough/congestion  Prednisone '20mg'$  daily for 5 days  Continue on Oxygen 2.5L/m .  Begin Albuterol /Ipratropium Neb Twice daily   Albuterol inhaler or neb As needed   Follow up with Dr. Ander Slade in 4-6 weeks and As needed   Please contact office for sooner follow up if symptoms do not improve or worsen or seek emergency care

## 2022-06-05 NOTE — Progress Notes (Signed)
$'@Patient'v$  ID: [name], female    DOB: 23-Sep-1945, 76 y.o.   MRN: [id number]  Chief Complaint  Patient presents with   Acute Visit    Referring provider: Leotis Shames*  HPI: 76 year old female former smoker followed for COPD, sarcoidosis with pulmonary fibrosis and chronic respiratory failure  TEST/EVENTS :  CT chest April 14, 2022 negative for PE, small bilateral pleural effusions, extensive chronic lung changes consistent with scarring and sarcoidosis stable from previous scan decreased left lower lobe nodular opacities  CT scan reviewed with the patient-most recent CT scan 10/25/2021 -Concerned with nodular change at the base -Most recent CT scan reviewed 12/10/2021 showing extensive fibrotic changes -CT scan 04/14/2022 reviewed by myself showing stable findings, extensive fibrotic changes  CT scan as far back as 2011 was reviewed Chronic fibrotic changes  06/05/2022 Follow up : COPD, sarcoidosis, chronic respiratory failure Patient presents for an acute office visit.  She complains over the last month that she has been getting progressively weak.  1 week started with increased cough and congestion and shortness of breath.  Coughing up thick mucus.  Patient says she does feel weak.  But has a very good appetite with no nausea vomiting or diarrhea. She remains on Wixela inhaler twice daily.  She does have an albuterol nebulizers which she uses at least once or twice a day.  Remains on oxygen 2.5 L.  She is doing PT at home.  She has a POC device which is helping her when she is away from home.  She denies any hemoptysis, chest pain, orthopnea, increased edema. Has tried several different inhalers in the past either feels that they did not work or were too expensive.  Is able to afford Wixela.   Allergies  Allergen Reactions   Ace Inhibitors Swelling   Lisinopril Swelling    REACTION: Angioedema   Valsartan Swelling    REACTION: angioedema.  Pt should not get  ACEI or ARBs of any kind due to angioedema.   Aspirin Other (See Comments)    REACTION: GI ulcer.   Penicillins     Childhood allergy Has patient had a PCN reaction causing immediate rash, facial/tongue/throat swelling, SOB or lightheadedness with hypotension: Unknown Has patient had a PCN reaction causing severe rash involving mucus membranes or skin necrosis: Unknown Has patient had a PCN reaction that required hospitalization: Unknown Has patient had a PCN reaction occurring within the last 10 years: Unknown If all of the above answers are "NO", then may proceed with Cephalosporin use.     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   Depression 06/08/2007   Essential hypertension, benign 06/08/2007   Family history of breast cancer    GERD 01/18/2009   Osteoporosis 12/31/2012   Left femur had T score -2.5 on DEXA scan 02/26/2009.    Recurrent dislocation of hip 06/28/2010   right hip  Sarcoidosis 02/05/2009   PFTs 03/30/2009: FVC 67%, FEV1 48%, FEV1/FVC 52%, TLC 104%, DLCO 71%.   Vitamin D deficiency     Tobacco History: Social History   Tobacco Use  Smoking Status Former   Packs/day: 0.25   Years: 30.00   Total pack years: 7.50   Types: Cigarettes   Quit date: 05/11/2010   Years since quitting: 12.0  Smokeless Tobacco Never    Counseling given: Not Answered   Outpatient Medications Prior to Visit  Medication Sig Dispense Refill   acetaminophen (TYLENOL) 500 MG tablet Take 1,000 mg by mouth every 6 (six) hours as needed for moderate pain.     albuterol (PROVENTIL) (2.5 MG/3ML) 0.083% nebulizer solution Inhale 3 mLs (2.5 mg total) by nebulization every 6 (six) hours as needed for wheezing. 180 mL 2   albuterol (VENTOLIN HFA) 108 (90 Base) MCG/ACT inhaler INHALE TWO PUFFS EVERY 6 HOURS AS NEEDED FOR WHEEZING (Patient taking differently: Inhale 2 puffs into the lungs every 6 (six) hours as needed for wheezing. INHALE TWO PUFFS EVERY 6 HOURS AS NEEDED FOR WHEEZING) 8.5 g 19   amLODipine (NORVASC) 5 MG tablet Take 1 tablet (5 mg total) by mouth daily. 90 tablet 1   benzonatate (TESSALON PERLES) 100 MG capsule Take 1 capsule (100 mg total) by mouth every 6 (six) hours as needed for cough. 30 capsule 0   cefpodoxime (VANTIN) 200 MG tablet Take 1 tablet (200 mg total) by mouth 2 (two) times daily. 8 tablet 0   cyanocobalamin (,VITAMIN B-12,) 1000 MCG/ML injection Inject 26m in deltoid once weekly for 4 weeks, then inject 1 ml once a month thereafter 6 mL 3   famotidine (PEPCID) 20 MG tablet TAKE ONE TABLET BY MOUTH TWICE DAILY 60 tablet 5   fluticasone-salmeterol (WIXELA INHUB) 500-50 MCG/ACT AEPB Inhale 1 puff into the lungs in the morning and at bedtime. 60 each 2   gabapentin (NEURONTIN) 100 MG capsule TAKE ONE CAPSULE BY MOUTH THREE TIMES DAILY 90 capsule 1   guaiFENesin (MUCINEX) 600 MG 12 hr tablet Take 1 tablet (600 mg total) by mouth 2 (two) times daily.     hydrochlorothiazide (HYDRODIURIL) 25 MG tablet TAKE ONE-HALF TO 1 TABLET BY MOUTH DAILY AS NEEDED FOR SWELLING (Patient taking differently: Take 12.5 mg by mouth daily as needed (for swelling).) 45 tablet 1   loratadine (CLARITIN) 10 MG tablet Take 1 tablet (10 mg total) by mouth daily. 90 tablet 1   Magnesium 500 MG TABS Take 1 tablet (500 mg total) by mouth at  bedtime. 90 tablet 1   melatonin 5 MG TABS Take 2 tablets (10 mg total) by mouth at bedtime. 60 tablet 5   metoprolol succinate (TOPROL-XL) 50 MG 24 hr tablet TAKE ONE TABLET BY MOUTH DAILY WITH OR IMMEDIATELY FOLLOWING A MEAL (Patient taking differently: Take 50 mg by mouth daily. TAKE ONE TABLET BY MOUTH DAILY WITH OR IMMEDIATELY FOLLOWING A MEAL) 30 tablet 5   mirtazapine (REMERON) 15 MG tablet TAKE ONE TABLET BY MOUTH AT BEDTIME (Patient taking differently: Take 15 mg by mouth at bedtime.) 30 tablet 11   pantoprazole (PROTONIX) 40 MG tablet TAKE ONE TABLET BY MOUTH DAILY 90 tablet 0   pravastatin (PRAVACHOL) 40 MG tablet Take 1 tablet (40 mg total) by mouth every evening. 90 tablet 1   SYRINGE-NEEDLE, DISP, 3 ML (BD SAFETYGLIDE SYRINGE/NEEDLE) 25G X 1" 3 ML MISC Use for B12 injections 100 each 11   Vitamin D, Ergocalciferol, (DRISDOL) 1.25 MG (50000  UNIT) CAPS capsule TAKE ONE CAPSULE BY MOUTH EVERY WEEK 12 capsule 11   No facility-administered medications prior to visit.     Review of Systems:   Constitutional:   No  weight loss, night sweats,  Fevers, chills,  +fatigue, or  lassitude.  HEENT:   No headaches,  Difficulty swallowing,  Tooth/dental problems, or  Sore throat,                No sneezing, itching, ear ache, nasal congestion, post nasal drip,   CV:  No chest pain,  Orthopnea, PND, swelling in lower extremities, anasarca, dizziness, palpitations, syncope.   GI  No heartburn, indigestion, abdominal pain, nausea, vomiting, diarrhea, change in bowel habits, loss of appetite, bloody stools.   Resp:   No chest wall deformity  Skin: no rash or lesions.  GU: no dysuria, change in color of urine, no urgency or frequency.  No flank pain, no hematuria   MS:  No joint pain or swelling.  No decreased range of motion.  No back pain.    Physical Exam  BP 118/62 (BP Location: Right Arm, Patient Position: Sitting, Cuff Size: Normal)   Pulse 97   Temp 97.9 F (36.6 C) (Oral)    SpO2 98%   GEN: A/Ox3; pleasant , NAD, thin, frail, cachectic, wheelchair, on oxygen   HEENT:  [place]/AT,  EACs-clear, TMs-wnl, NOSE-clear, THROAT-clear, no lesions, no postnasal drip or exudate noted.  Poor dentition  NECK:  Supple w/ fair ROM; no JVD; normal carotid impulses w/o bruits; no thyromegaly or nodules palpated; no lymphadenopathy.    RESP coarse breath sounds with a few scattered rhonchi, bibasilar crackles no accessory muscle use, no dullness to percussion  CARD:  RRR, no m/r/g, no peripheral edema, pulses intact, no cyanosis or clubbing.  GI:   Soft & nt; nml bowel sounds; no organomegaly or masses detected.   Musco: Warm bil, no deformities or joint swelling noted.   Neuro: alert, no focal deficits noted.    Skin: Warm, no lesions or rashes    Lab Results:  CBC   BMET  No results found for: "PROBNP"  Imaging: DG Chest 2 View  Result Date: 06/05/2022 CLINICAL DATA:  COPD.  Sarcoid.  Dyspnea and cough for 4 weeks. EXAM: CHEST - 2 VIEW COMPARISON:  AP chest 04/14/2022, 04/08/2021, 09/21/2020; chest CT 04/14/2022 FINDINGS: Cardiac silhouette is within normal limits. Mediastinal contours are not well evaluated given chronic overlying lung opacities. Calcified hilar and mediastinal lymph nodes are again seen consistent with chronic sarcoid. Left greater than right upper lung coarsened, reticular opacities representing interstitial fibrosis with retraction. Upper lung lucencies from bronchiectasis better seen on prior CT. No definite new acute airspace opacity is identified. No definite pleural effusion. No pneumothorax. Moderate multilevel degenerative disc changes of the thoracic spine. IMPRESSION: Stable severe left-greater-than-right overlying predominant fibrotic changes in the lungs, again consistent with the patient's reported history of chronic sarcoid. No definite acute cardiopulmonary process. Electronically Signed   By: Yvonne Kendall M.D.   On: 06/05/2022 10:33           No data to display          No results found for: "NITRICOXIDE"      Assessment & Plan:   COPD with acute exacerbation ([place]) Acute COPD exacerbation.  Patient is prone to recurrent infections.  Chest x-ray today shows no acute process.  Patient has very high symptom burden and at risk for decompensation.  Went over in detail  with patient and daughter if symptoms or not improving or worsen will need hospitalization.  She wants to avoid hospitalization if at all possible.  Patient is eating and drinking O2 saturations are 98% on 2 L pulsed oxygen. We will treat with empiric antibiotics and short course of steroids. We will add in scheduled DuoNeb twice daily.  Continue on [place]  Patient Instructions  Doxycycline '100mg'$  Twice daily  for 1 week, take with food.  Mucinex DM Twice daily  As needed  cough/congestion  Prednisone '20mg'$  daily for 5 days  Continue on Oxygen 2.5L/m .  Begin Albuterol /Ipratropium Neb Twice daily   Albuterol inhaler or neb As needed   Follow up with Dr. Ander Slade in 4-6 weeks and As needed   Please contact office for sooner follow up if symptoms do not improve or worsen or seek emergency care       Chronic respiratory failure with hypoxia ([place]) Continue on oxygen to maintain O2 saturation greater than 88 to 90%.   Plan  Patient Instructions  Doxycycline '100mg'$  Twice daily  for 1 week, take with food.  Mucinex DM Twice daily  As needed  cough/congestion  Prednisone '20mg'$  daily for 5 days  Continue on Oxygen 2.5L/m .  Begin Albuterol /Ipratropium Neb Twice daily   Albuterol inhaler or neb As needed   Follow up with Dr. Ander Slade in 4-6 weeks and As needed   Please contact office for sooner follow up if symptoms do not improve or worsen or seek emergency care       Pulmonary fibrosis ([place]) Sarcoidosis with pulmonary fibrosis.  Continue on oxygen to maintain O2 saturation greater than 88 to 90%.   I spent   45 minutes dedicated to the  care of this patient on the date of this encounter to include pre-visit review of records, face-to-face time with the patient discussing conditions above, post visit ordering of testing, clinical documentation with the electronic health record, making appropriate referrals as documented, and communicating necessary findings to members of the patients care team.    Rexene Edison, NP 06/05/2022

## 2022-06-05 NOTE — Assessment & Plan Note (Signed)
Sarcoidosis with pulmonary fibrosis.  Continue on oxygen to maintain O2 saturation greater than 88 to 90%.

## 2022-06-05 NOTE — Assessment & Plan Note (Signed)
Continue on oxygen to maintain O2 saturation greater than 88 to 90%.   Plan  Patient Instructions  Doxycycline '100mg'$  Twice daily  for 1 week, take with food.  Mucinex DM Twice daily  As needed  cough/congestion  Prednisone '20mg'$  daily for 5 days  Continue on Oxygen 2.5L/m .  Begin Albuterol /Ipratropium Neb Twice daily   Albuterol inhaler or neb As needed   Follow up with Dr. Ander Slade in 4-6 weeks and As needed   Please contact office for sooner follow up if symptoms do not improve or worsen or seek emergency care

## 2022-06-06 DIAGNOSIS — I471 Supraventricular tachycardia, unspecified: Secondary | ICD-10-CM | POA: Diagnosis not present

## 2022-06-06 DIAGNOSIS — Z79899 Other long term (current) drug therapy: Secondary | ICD-10-CM | POA: Diagnosis not present

## 2022-06-06 DIAGNOSIS — A419 Sepsis, unspecified organism: Secondary | ICD-10-CM | POA: Diagnosis not present

## 2022-06-06 DIAGNOSIS — H5461 Unqualified visual loss, right eye, normal vision left eye: Secondary | ICD-10-CM | POA: Diagnosis not present

## 2022-06-06 DIAGNOSIS — Z7951 Long term (current) use of inhaled steroids: Secondary | ICD-10-CM | POA: Diagnosis not present

## 2022-06-06 DIAGNOSIS — H269 Unspecified cataract: Secondary | ICD-10-CM | POA: Diagnosis not present

## 2022-06-06 DIAGNOSIS — E871 Hypo-osmolality and hyponatremia: Secondary | ICD-10-CM | POA: Diagnosis not present

## 2022-06-06 DIAGNOSIS — Z9981 Dependence on supplemental oxygen: Secondary | ICD-10-CM | POA: Diagnosis not present

## 2022-06-06 DIAGNOSIS — J8410 Pulmonary fibrosis, unspecified: Secondary | ICD-10-CM | POA: Diagnosis not present

## 2022-06-06 DIAGNOSIS — Z87891 Personal history of nicotine dependence: Secondary | ICD-10-CM | POA: Diagnosis not present

## 2022-06-06 DIAGNOSIS — M810 Age-related osteoporosis without current pathological fracture: Secondary | ICD-10-CM | POA: Diagnosis not present

## 2022-06-06 DIAGNOSIS — G319 Degenerative disease of nervous system, unspecified: Secondary | ICD-10-CM | POA: Diagnosis not present

## 2022-06-06 DIAGNOSIS — J690 Pneumonitis due to inhalation of food and vomit: Secondary | ICD-10-CM | POA: Diagnosis not present

## 2022-06-06 DIAGNOSIS — J439 Emphysema, unspecified: Secondary | ICD-10-CM | POA: Diagnosis not present

## 2022-06-06 DIAGNOSIS — M24451 Recurrent dislocation, right hip: Secondary | ICD-10-CM | POA: Diagnosis not present

## 2022-06-06 DIAGNOSIS — I10 Essential (primary) hypertension: Secondary | ICD-10-CM | POA: Diagnosis not present

## 2022-06-06 DIAGNOSIS — E440 Moderate protein-calorie malnutrition: Secondary | ICD-10-CM | POA: Diagnosis not present

## 2022-06-06 DIAGNOSIS — F32A Depression, unspecified: Secondary | ICD-10-CM | POA: Diagnosis not present

## 2022-06-06 DIAGNOSIS — E785 Hyperlipidemia, unspecified: Secondary | ICD-10-CM | POA: Diagnosis not present

## 2022-06-06 DIAGNOSIS — E875 Hyperkalemia: Secondary | ICD-10-CM | POA: Diagnosis not present

## 2022-06-06 DIAGNOSIS — D869 Sarcoidosis, unspecified: Secondary | ICD-10-CM | POA: Diagnosis not present

## 2022-06-06 DIAGNOSIS — Z7952 Long term (current) use of systemic steroids: Secondary | ICD-10-CM | POA: Diagnosis not present

## 2022-06-06 DIAGNOSIS — J9611 Chronic respiratory failure with hypoxia: Secondary | ICD-10-CM | POA: Diagnosis not present

## 2022-06-06 DIAGNOSIS — K219 Gastro-esophageal reflux disease without esophagitis: Secondary | ICD-10-CM | POA: Diagnosis not present

## 2022-06-09 ENCOUNTER — Telehealth: Payer: Self-pay | Admitting: Adult Health

## 2022-06-09 DIAGNOSIS — Z79899 Other long term (current) drug therapy: Secondary | ICD-10-CM | POA: Diagnosis not present

## 2022-06-09 DIAGNOSIS — D869 Sarcoidosis, unspecified: Secondary | ICD-10-CM | POA: Diagnosis not present

## 2022-06-09 DIAGNOSIS — I10 Essential (primary) hypertension: Secondary | ICD-10-CM | POA: Diagnosis not present

## 2022-06-09 DIAGNOSIS — I471 Supraventricular tachycardia, unspecified: Secondary | ICD-10-CM | POA: Diagnosis not present

## 2022-06-09 DIAGNOSIS — A419 Sepsis, unspecified organism: Secondary | ICD-10-CM | POA: Diagnosis not present

## 2022-06-09 DIAGNOSIS — E871 Hypo-osmolality and hyponatremia: Secondary | ICD-10-CM | POA: Diagnosis not present

## 2022-06-09 DIAGNOSIS — M24451 Recurrent dislocation, right hip: Secondary | ICD-10-CM | POA: Diagnosis not present

## 2022-06-09 DIAGNOSIS — J439 Emphysema, unspecified: Secondary | ICD-10-CM | POA: Diagnosis not present

## 2022-06-09 DIAGNOSIS — Z9981 Dependence on supplemental oxygen: Secondary | ICD-10-CM | POA: Diagnosis not present

## 2022-06-09 DIAGNOSIS — E875 Hyperkalemia: Secondary | ICD-10-CM | POA: Diagnosis not present

## 2022-06-09 DIAGNOSIS — J8410 Pulmonary fibrosis, unspecified: Secondary | ICD-10-CM | POA: Diagnosis not present

## 2022-06-09 DIAGNOSIS — Z7951 Long term (current) use of inhaled steroids: Secondary | ICD-10-CM | POA: Diagnosis not present

## 2022-06-09 DIAGNOSIS — G319 Degenerative disease of nervous system, unspecified: Secondary | ICD-10-CM | POA: Diagnosis not present

## 2022-06-09 DIAGNOSIS — J9611 Chronic respiratory failure with hypoxia: Secondary | ICD-10-CM | POA: Diagnosis not present

## 2022-06-09 DIAGNOSIS — H269 Unspecified cataract: Secondary | ICD-10-CM | POA: Diagnosis not present

## 2022-06-09 DIAGNOSIS — Z87891 Personal history of nicotine dependence: Secondary | ICD-10-CM | POA: Diagnosis not present

## 2022-06-09 DIAGNOSIS — J690 Pneumonitis due to inhalation of food and vomit: Secondary | ICD-10-CM | POA: Diagnosis not present

## 2022-06-09 DIAGNOSIS — E785 Hyperlipidemia, unspecified: Secondary | ICD-10-CM | POA: Diagnosis not present

## 2022-06-09 DIAGNOSIS — E440 Moderate protein-calorie malnutrition: Secondary | ICD-10-CM | POA: Diagnosis not present

## 2022-06-09 DIAGNOSIS — M810 Age-related osteoporosis without current pathological fracture: Secondary | ICD-10-CM | POA: Diagnosis not present

## 2022-06-09 DIAGNOSIS — K219 Gastro-esophageal reflux disease without esophagitis: Secondary | ICD-10-CM | POA: Diagnosis not present

## 2022-06-09 DIAGNOSIS — H5461 Unqualified visual loss, right eye, normal vision left eye: Secondary | ICD-10-CM | POA: Diagnosis not present

## 2022-06-09 DIAGNOSIS — F32A Depression, unspecified: Secondary | ICD-10-CM | POA: Diagnosis not present

## 2022-06-09 DIAGNOSIS — Z7952 Long term (current) use of systemic steroids: Secondary | ICD-10-CM | POA: Diagnosis not present

## 2022-06-10 ENCOUNTER — Telehealth: Payer: Self-pay | Admitting: Internal Medicine

## 2022-06-10 DIAGNOSIS — H269 Unspecified cataract: Secondary | ICD-10-CM | POA: Diagnosis not present

## 2022-06-10 DIAGNOSIS — H5461 Unqualified visual loss, right eye, normal vision left eye: Secondary | ICD-10-CM | POA: Diagnosis not present

## 2022-06-10 DIAGNOSIS — D869 Sarcoidosis, unspecified: Secondary | ICD-10-CM | POA: Diagnosis not present

## 2022-06-10 DIAGNOSIS — E871 Hypo-osmolality and hyponatremia: Secondary | ICD-10-CM | POA: Diagnosis not present

## 2022-06-10 DIAGNOSIS — J690 Pneumonitis due to inhalation of food and vomit: Secondary | ICD-10-CM | POA: Diagnosis not present

## 2022-06-10 DIAGNOSIS — M810 Age-related osteoporosis without current pathological fracture: Secondary | ICD-10-CM | POA: Diagnosis not present

## 2022-06-10 DIAGNOSIS — K219 Gastro-esophageal reflux disease without esophagitis: Secondary | ICD-10-CM | POA: Diagnosis not present

## 2022-06-10 DIAGNOSIS — F32A Depression, unspecified: Secondary | ICD-10-CM | POA: Diagnosis not present

## 2022-06-10 DIAGNOSIS — Z87891 Personal history of nicotine dependence: Secondary | ICD-10-CM | POA: Diagnosis not present

## 2022-06-10 DIAGNOSIS — E875 Hyperkalemia: Secondary | ICD-10-CM | POA: Diagnosis not present

## 2022-06-10 DIAGNOSIS — J439 Emphysema, unspecified: Secondary | ICD-10-CM | POA: Diagnosis not present

## 2022-06-10 DIAGNOSIS — A419 Sepsis, unspecified organism: Secondary | ICD-10-CM | POA: Diagnosis not present

## 2022-06-10 DIAGNOSIS — Z7952 Long term (current) use of systemic steroids: Secondary | ICD-10-CM | POA: Diagnosis not present

## 2022-06-10 DIAGNOSIS — J8410 Pulmonary fibrosis, unspecified: Secondary | ICD-10-CM | POA: Diagnosis not present

## 2022-06-10 DIAGNOSIS — J9611 Chronic respiratory failure with hypoxia: Secondary | ICD-10-CM | POA: Diagnosis not present

## 2022-06-10 DIAGNOSIS — I471 Supraventricular tachycardia, unspecified: Secondary | ICD-10-CM | POA: Diagnosis not present

## 2022-06-10 DIAGNOSIS — Z9981 Dependence on supplemental oxygen: Secondary | ICD-10-CM | POA: Diagnosis not present

## 2022-06-10 DIAGNOSIS — E785 Hyperlipidemia, unspecified: Secondary | ICD-10-CM | POA: Diagnosis not present

## 2022-06-10 DIAGNOSIS — Z79899 Other long term (current) drug therapy: Secondary | ICD-10-CM | POA: Diagnosis not present

## 2022-06-10 DIAGNOSIS — M24451 Recurrent dislocation, right hip: Secondary | ICD-10-CM | POA: Diagnosis not present

## 2022-06-10 DIAGNOSIS — I10 Essential (primary) hypertension: Secondary | ICD-10-CM | POA: Diagnosis not present

## 2022-06-10 DIAGNOSIS — Z7951 Long term (current) use of inhaled steroids: Secondary | ICD-10-CM | POA: Diagnosis not present

## 2022-06-10 DIAGNOSIS — E440 Moderate protein-calorie malnutrition: Secondary | ICD-10-CM | POA: Diagnosis not present

## 2022-06-10 DIAGNOSIS — G319 Degenerative disease of nervous system, unspecified: Secondary | ICD-10-CM | POA: Diagnosis not present

## 2022-06-10 NOTE — Telephone Encounter (Signed)
Requesting extension of physical therapy 1x8 effective 05/13/00 recertify patient. Says she saw patient yesterday and patient was having difficulty breathing, advised patient to seek urgent care,called pulmonologist

## 2022-06-10 NOTE — Telephone Encounter (Signed)
Called patient directly but she did not answer. Left message for her to call us back.

## 2022-06-10 NOTE — Telephone Encounter (Signed)
Verbal orders given to [name] as well as Dr Ledell Noss recommendation.

## 2022-06-13 DIAGNOSIS — Z7952 Long term (current) use of systemic steroids: Secondary | ICD-10-CM | POA: Diagnosis not present

## 2022-06-13 DIAGNOSIS — M810 Age-related osteoporosis without current pathological fracture: Secondary | ICD-10-CM | POA: Diagnosis not present

## 2022-06-13 DIAGNOSIS — J8410 Pulmonary fibrosis, unspecified: Secondary | ICD-10-CM | POA: Diagnosis not present

## 2022-06-13 DIAGNOSIS — H269 Unspecified cataract: Secondary | ICD-10-CM | POA: Diagnosis not present

## 2022-06-13 DIAGNOSIS — Z7951 Long term (current) use of inhaled steroids: Secondary | ICD-10-CM | POA: Diagnosis not present

## 2022-06-13 DIAGNOSIS — E871 Hypo-osmolality and hyponatremia: Secondary | ICD-10-CM | POA: Diagnosis not present

## 2022-06-13 DIAGNOSIS — I10 Essential (primary) hypertension: Secondary | ICD-10-CM | POA: Diagnosis not present

## 2022-06-13 DIAGNOSIS — Z9981 Dependence on supplemental oxygen: Secondary | ICD-10-CM | POA: Diagnosis not present

## 2022-06-13 DIAGNOSIS — K219 Gastro-esophageal reflux disease without esophagitis: Secondary | ICD-10-CM | POA: Diagnosis not present

## 2022-06-13 DIAGNOSIS — J439 Emphysema, unspecified: Secondary | ICD-10-CM | POA: Diagnosis not present

## 2022-06-13 DIAGNOSIS — Z79899 Other long term (current) drug therapy: Secondary | ICD-10-CM | POA: Diagnosis not present

## 2022-06-13 DIAGNOSIS — E785 Hyperlipidemia, unspecified: Secondary | ICD-10-CM | POA: Diagnosis not present

## 2022-06-13 DIAGNOSIS — I471 Supraventricular tachycardia, unspecified: Secondary | ICD-10-CM | POA: Diagnosis not present

## 2022-06-13 DIAGNOSIS — J9611 Chronic respiratory failure with hypoxia: Secondary | ICD-10-CM | POA: Diagnosis not present

## 2022-06-13 DIAGNOSIS — J690 Pneumonitis due to inhalation of food and vomit: Secondary | ICD-10-CM | POA: Diagnosis not present

## 2022-06-13 DIAGNOSIS — Z87891 Personal history of nicotine dependence: Secondary | ICD-10-CM | POA: Diagnosis not present

## 2022-06-13 DIAGNOSIS — G319 Degenerative disease of nervous system, unspecified: Secondary | ICD-10-CM | POA: Diagnosis not present

## 2022-06-13 DIAGNOSIS — E875 Hyperkalemia: Secondary | ICD-10-CM | POA: Diagnosis not present

## 2022-06-13 DIAGNOSIS — E440 Moderate protein-calorie malnutrition: Secondary | ICD-10-CM | POA: Diagnosis not present

## 2022-06-13 DIAGNOSIS — M24451 Recurrent dislocation, right hip: Secondary | ICD-10-CM | POA: Diagnosis not present

## 2022-06-13 DIAGNOSIS — H5461 Unqualified visual loss, right eye, normal vision left eye: Secondary | ICD-10-CM | POA: Diagnosis not present

## 2022-06-13 DIAGNOSIS — D869 Sarcoidosis, unspecified: Secondary | ICD-10-CM | POA: Diagnosis not present

## 2022-06-13 DIAGNOSIS — A419 Sepsis, unspecified organism: Secondary | ICD-10-CM | POA: Diagnosis not present

## 2022-06-13 DIAGNOSIS — F32A Depression, unspecified: Secondary | ICD-10-CM | POA: Diagnosis not present

## 2022-06-14 ENCOUNTER — Encounter (HOSPITAL_COMMUNITY): Payer: Self-pay

## 2022-06-14 ENCOUNTER — Inpatient Hospital Stay (HOSPITAL_COMMUNITY)
Admission: EM | Admit: 2022-06-14 | Discharge: 2022-06-17 | DRG: 189 | Disposition: A | Discharge status: Home Health | Payer: Medicare Other | Attending: Internal Medicine | Admitting: Internal Medicine

## 2022-06-14 ENCOUNTER — Emergency Department (HOSPITAL_COMMUNITY): Payer: Medicare Other

## 2022-06-14 ENCOUNTER — Other Ambulatory Visit: Payer: Self-pay

## 2022-06-14 DIAGNOSIS — Z825 Family history of asthma and other chronic lower respiratory diseases: Secondary | ICD-10-CM | POA: Diagnosis not present

## 2022-06-14 DIAGNOSIS — E876 Hypokalemia: Secondary | ICD-10-CM

## 2022-06-14 DIAGNOSIS — J9611 Chronic respiratory failure with hypoxia: Secondary | ICD-10-CM | POA: Diagnosis not present

## 2022-06-14 DIAGNOSIS — Z7952 Long term (current) use of systemic steroids: Secondary | ICD-10-CM

## 2022-06-14 DIAGNOSIS — J9622 Acute and chronic respiratory failure with hypercapnia: Principal | ICD-10-CM

## 2022-06-14 DIAGNOSIS — D869 Sarcoidosis, unspecified: Secondary | ICD-10-CM | POA: Diagnosis not present

## 2022-06-14 DIAGNOSIS — J441 Chronic obstructive pulmonary disease with (acute) exacerbation: Secondary | ICD-10-CM

## 2022-06-14 DIAGNOSIS — Z888 Allergy status to other drugs, medicaments and biological substances status: Secondary | ICD-10-CM

## 2022-06-14 DIAGNOSIS — R64 Cachexia: Secondary | ICD-10-CM | POA: Diagnosis present

## 2022-06-14 DIAGNOSIS — J8410 Pulmonary fibrosis, unspecified: Secondary | ICD-10-CM | POA: Diagnosis present

## 2022-06-14 DIAGNOSIS — Z803 Family history of malignant neoplasm of breast: Secondary | ICD-10-CM | POA: Diagnosis not present

## 2022-06-14 DIAGNOSIS — Z79899 Other long term (current) drug therapy: Secondary | ICD-10-CM

## 2022-06-14 DIAGNOSIS — R531 Weakness: Secondary | ICD-10-CM | POA: Diagnosis not present

## 2022-06-14 DIAGNOSIS — Z886 Allergy status to analgesic agent status: Secondary | ICD-10-CM | POA: Diagnosis not present

## 2022-06-14 DIAGNOSIS — J439 Emphysema, unspecified: Secondary | ICD-10-CM | POA: Diagnosis not present

## 2022-06-14 DIAGNOSIS — Z1152 Encounter for screening for COVID-19: Secondary | ICD-10-CM

## 2022-06-14 DIAGNOSIS — A419 Sepsis, unspecified organism: Secondary | ICD-10-CM | POA: Diagnosis not present

## 2022-06-14 DIAGNOSIS — H5462 Unqualified visual loss, left eye, normal vision right eye: Secondary | ICD-10-CM | POA: Diagnosis not present

## 2022-06-14 DIAGNOSIS — Z634 Disappearance and death of family member: Secondary | ICD-10-CM

## 2022-06-14 DIAGNOSIS — K289 Gastrojejunal ulcer, unspecified as acute or chronic, without hemorrhage or perforation: Secondary | ICD-10-CM | POA: Diagnosis present

## 2022-06-14 DIAGNOSIS — Z96641 Presence of right artificial hip joint: Secondary | ICD-10-CM | POA: Diagnosis not present

## 2022-06-14 DIAGNOSIS — Z743 Need for continuous supervision: Secondary | ICD-10-CM | POA: Diagnosis not present

## 2022-06-14 DIAGNOSIS — R0689 Other abnormalities of breathing: Secondary | ICD-10-CM

## 2022-06-14 DIAGNOSIS — M810 Age-related osteoporosis without current pathological fracture: Secondary | ICD-10-CM | POA: Diagnosis present

## 2022-06-14 DIAGNOSIS — Z87891 Personal history of nicotine dependence: Secondary | ICD-10-CM | POA: Diagnosis not present

## 2022-06-14 DIAGNOSIS — J690 Pneumonitis due to inhalation of food and vomit: Secondary | ICD-10-CM | POA: Diagnosis not present

## 2022-06-14 DIAGNOSIS — Z8249 Family history of ischemic heart disease and other diseases of the circulatory system: Secondary | ICD-10-CM | POA: Diagnosis not present

## 2022-06-14 DIAGNOSIS — Z880 Allergy status to penicillin: Secondary | ICD-10-CM | POA: Diagnosis not present

## 2022-06-14 DIAGNOSIS — R0602 Shortness of breath: Secondary | ICD-10-CM | POA: Diagnosis not present

## 2022-06-14 DIAGNOSIS — K219 Gastro-esophageal reflux disease without esophagitis: Secondary | ICD-10-CM | POA: Diagnosis present

## 2022-06-14 DIAGNOSIS — Z833 Family history of diabetes mellitus: Secondary | ICD-10-CM

## 2022-06-14 DIAGNOSIS — Z823 Family history of stroke: Secondary | ICD-10-CM | POA: Diagnosis not present

## 2022-06-14 DIAGNOSIS — G9341 Metabolic encephalopathy: Secondary | ICD-10-CM | POA: Diagnosis not present

## 2022-06-14 DIAGNOSIS — Z681 Body mass index (BMI) 19 or less, adult: Secondary | ICD-10-CM

## 2022-06-14 DIAGNOSIS — R4182 Altered mental status, unspecified: Principal | ICD-10-CM

## 2022-06-14 DIAGNOSIS — Z9981 Dependence on supplemental oxygen: Secondary | ICD-10-CM | POA: Diagnosis not present

## 2022-06-14 DIAGNOSIS — I10 Essential (primary) hypertension: Secondary | ICD-10-CM | POA: Diagnosis present

## 2022-06-14 DIAGNOSIS — R6889 Other general symptoms and signs: Secondary | ICD-10-CM | POA: Diagnosis not present

## 2022-06-14 LAB — COMPREHENSIVE METABOLIC PANEL
ALT: 12 U/L (ref 0–44)
AST: 16 U/L (ref 15–41)
Albumin: 2.8 g/dL — ABNORMAL LOW (ref 3.5–5.0)
Alkaline Phosphatase: 72 U/L (ref 38–126)
Anion gap: 7 (ref 5–15)
BUN: 13 mg/dL (ref 8–23)
CO2: 37 mmol/L — ABNORMAL HIGH (ref 22–32)
Calcium: 8.8 mg/dL — ABNORMAL LOW (ref 8.9–10.3)
Chloride: 89 mmol/L — ABNORMAL LOW (ref 98–111)
Creatinine, Ser: 0.82 mg/dL (ref 0.44–1.00)
GFR, Estimated: >60 mL/min (ref >60)
Glucose, Bld: 95 mg/dL (ref 70–99)
Potassium: 3.4 mmol/L — ABNORMAL LOW (ref 3.5–5.1)
Sodium: 133 mmol/L — ABNORMAL LOW (ref 135–145)
Total Bilirubin: 0.5 mg/dL (ref 0.3–1.2)
Total Protein: 6.5 g/dL (ref 6.5–8.1)

## 2022-06-14 LAB — BLOOD GAS, VENOUS
Acid-Base Excess: 22.6 mmol/L — ABNORMAL HIGH (ref 0.0–2.0)
Acid-Base Excess: 20.7 mmol/L — ABNORMAL HIGH (ref 0.0–2.0)
Bicarbonate: 50.6 mmol/L — ABNORMAL HIGH (ref 20.0–28.0)
Bicarbonate: 47.6 mmol/L — ABNORMAL HIGH (ref 20.0–28.0)
O2 Saturation: 56.6 %
O2 Saturation: 95.6 %
Patient temperature: 37
Patient temperature: 37
pCO2, Ven: 78 mmHg (ref 44–60)
pCO2, Ven: 61 mmHg — ABNORMAL HIGH (ref 44–60)
pH, Ven: 7.42 (ref 7.25–7.43)
pH, Ven: 7.5 — ABNORMAL HIGH (ref 7.25–7.43)
pO2, Ven: 35 mmHg (ref 32–45)
pO2, Ven: 66 mmHg — ABNORMAL HIGH (ref 32–45)

## 2022-06-14 LAB — CBC WITH DIFFERENTIAL/PLATELET
Abs Immature Granulocytes: 0.04 10*3/uL (ref 0.00–0.07)
Basophils Absolute: 0 10*3/uL (ref 0.0–0.1)
Basophils Relative: 0 %
Eosinophils Absolute: 0.4 10*3/uL (ref 0.0–0.5)
Eosinophils Relative: 5 %
HCT: 29.5 % — ABNORMAL LOW (ref 36.0–46.0)
Hemoglobin: 9.4 g/dL — ABNORMAL LOW (ref 12.0–15.0)
Immature Granulocytes: 1 %
Lymphocytes Relative: 21 %
Lymphs Abs: 1.5 10*3/uL (ref 0.7–4.0)
MCH: 25.7 pg — ABNORMAL LOW (ref 26.0–34.0)
MCHC: 31.9 g/dL (ref 30.0–36.0)
MCV: 80.6 fL (ref 80.0–100.0)
Monocytes Absolute: 0.6 10*3/uL (ref 0.1–1.0)
Monocytes Relative: 9 %
Neutro Abs: 4.4 10*3/uL (ref 1.7–7.7)
Neutrophils Relative %: 64 %
Platelets: 401 10*3/uL — ABNORMAL HIGH (ref 150–400)
RBC: 3.66 MIL/uL — ABNORMAL LOW (ref 3.87–5.11)
RDW: 15.5 % (ref 11.5–15.5)
WBC: 7 10*3/uL (ref 4.0–10.5)
nRBC: 0 % (ref 0.0–0.2)

## 2022-06-14 LAB — CBG MONITORING, ED: Glucose-Capillary: 99 mg/dL (ref 70–99)

## 2022-06-14 LAB — RESP PANEL BY RT-PCR (FLU A&B, COVID) ARPGX2
Influenza A by PCR: NEGATIVE
Influenza B by PCR: NEGATIVE
SARS Coronavirus 2 by RT PCR: NEGATIVE

## 2022-06-14 LAB — TROPONIN I (HIGH SENSITIVITY)
Troponin I (High Sensitivity): 5 ng/L (ref <18)
Troponin I (High Sensitivity): 5 ng/L (ref <18)

## 2022-06-14 LAB — ETHANOL: Alcohol, Ethyl (B): <10 mg/dL (ref <10)

## 2022-06-14 MED ORDER — ENOXAPARIN SODIUM 30 MG/0.3ML IJ SOSY
30.0000 mg | PREFILLED_SYRINGE | INTRAMUSCULAR | Status: DC
  Administered 2022-06-14 – 2022-06-17 (×4): 30 mg via SUBCUTANEOUS
  Filled 2022-06-14 (×4): qty 0.3

## 2022-06-14 MED ORDER — POTASSIUM CHLORIDE 10 MEQ/100ML IV SOLN
10.0000 meq | INTRAVENOUS | Status: AC
  Administered 2022-06-14 (×2): 10 meq via INTRAVENOUS
  Filled 2022-06-14 (×2): qty 100

## 2022-06-14 MED ORDER — IPRATROPIUM-ALBUTEROL 0.5-2.5 (3) MG/3ML IN SOLN
3.0000 mL | Freq: Three times a day (TID) | RESPIRATORY_TRACT | Status: DC
  Administered 2022-06-14 – 2022-06-17 (×10): 3 mL via RESPIRATORY_TRACT
  Filled 2022-06-14 (×10): qty 3

## 2022-06-14 MED ORDER — METHYLPREDNISOLONE SODIUM SUCC 40 MG IJ SOLR
40.0000 mg | Freq: Every day | INTRAMUSCULAR | Status: DC
  Administered 2022-06-14 – 2022-06-17 (×4): 40 mg via INTRAVENOUS
  Filled 2022-06-14 (×4): qty 1

## 2022-06-14 NOTE — H&P (Signed)
History and Physical    Patient: [name] ZPH:[id number] DOB: 1946-03-25 DOA: 06/14/2022 DOS: the patient was seen and examined on 06/14/2022 PCP: Isaac Bliss, Rayford Halsted, MD  Patient coming from: Home  Chief Complaint:  Chief Complaint  Patient presents with   Weakness   HPI: [name] is a 76 y.o. female with medical history significant of COPD with chronic hypoxemic respiratory failure on 2L home O2, pulmonary fibrosis secondary to sarcoidosis, HTN who presents with altered mental status and shortness of breath.  Pt reports feeling short of breath for the past week. Today a home health nurse came and increased her O2 from 2 to 3L. She is not sure how low her oxygent got. Has mild cough. Family also felt she was slower in her response.    In the ED, she was afebrile, normotension and required Bipap. VBG with ph of 7.42, CO2 of 78 and Bicarb of 50.   WBC of 7, Hgb of 9.4 Na of 133, K of 3.4, Cl 89, CO2 37, creatienine of 0.82  CT head is negative CXR shows questionable right upper lobe pneumonia Negative flu/COVID PCR     Review of Systems: As mentioned in the history of present illness. All other systems reviewed and are negative. 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   Depression 06/08/2007   Essential hypertension, benign 06/08/2007   Family history of breast cancer    GERD 01/18/2009   Osteoporosis 12/31/2012   Left femur had T score -2.5 on DEXA scan 02/26/2009.    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   Vitamin D deficiency    Past Surgical History:  Procedure Laterality Date   CATARACT EXTRACTION Right    COLONOSCOPY  2002   HIP ARTHROPLASTY  Right    HIP CLOSED REDUCTION Right 04/08/2021   Procedure: CLOSED REDUCTION HIP;  Surgeon: Mcarthur Rossetti, MD;  Location: [facility];  Service: Orthopedics;  Laterality: Right;   HIP CLOSED REDUCTION Right 09/15/2021   Procedure: CLOSED REDUCTION HIP;  Surgeon: Melina Schools, MD;  Location: [facility];  Service: Orthopedics;  Laterality: Right;   TUBAL LIGATION     Social History:  reports that she quit smoking about 12 years ago. Her smoking use included cigarettes. She has a 7.50 pack-year smoking history. She has never used smokeless tobacco. She reports that she does not currently use alcohol. She reports that she does not use drugs.  Allergies  Allergen Reactions   Ace Inhibitors Swelling   Lisinopril Swelling    REACTION: Angioedema   Valsartan Swelling    REACTION: angioedema.  Pt should not get ACEI or ARBs of any kind due to angioedema.   Aspirin Other (See Comments)    REACTION: GI ulcer.   Penicillins     Childhood allergy Has patient had a PCN reaction causing immediate rash, facial/tongue/throat swelling, SOB or lightheadedness with hypotension: Unknown Has patient had a PCN reaction causing severe rash involving mucus membranes or skin necrosis: Unknown Has patient had a PCN reaction that required hospitalization: Unknown Has patient had a PCN reaction occurring within the last 10 years: Unknown If all of the  above answers are "NO", then may proceed with Cephalosporin use.     Family History  Problem Relation Age of Onset   Heart disease Mother    Breast cancer Mother 33   Stroke Father    Heart disease Sister    Asthma Daughter    Cancer Other    Diabetes Other    Hypertension Other    COPD Brother        "9/11 survivor"   COPD Sister        "9/11 survivor"   Breast cancer Maternal Grandmother        dx in her 88s   Breast cancer Other        MGMs mother died in her 32s   Colon cancer Neg Hx     Prior to Admission medications   Medication Sig Start Date  End Date Taking? Authorizing Provider  acetaminophen (TYLENOL) 500 MG tablet Take 1,000 mg by mouth every 6 (six) hours as needed for moderate pain.    [provider]  albuterol (PROVENTIL) (2.5 MG/3ML) 0.083% nebulizer solution Inhale 3 mLs (2.5 mg total) by nebulization every 6 (six) hours as needed for wheezing. 04/17/22   Danford, Suann Larry, MD  albuterol (VENTOLIN HFA) 108 (90 Base) MCG/ACT inhaler INHALE TWO PUFFS EVERY 6 HOURS AS NEEDED FOR WHEEZING Patient taking differently: Inhale 2 puffs into the lungs every 6 (six) hours as needed for wheezing. INHALE TWO PUFFS EVERY 6 HOURS AS NEEDED FOR WHEEZING 04/02/22   Isaac Bliss, Rayford Halsted, MD  amLODipine (NORVASC) 5 MG tablet Take 1 tablet (5 mg total) by mouth daily. 03/24/22   Isaac Bliss, Rayford Halsted, MD  benzonatate (TESSALON PERLES) 100 MG capsule Take 1 capsule (100 mg total) by mouth every 6 (six) hours as needed for cough. 04/17/22 04/17/23  Danford, Suann Larry, MD  cefpodoxime (VANTIN) 200 MG tablet Take 1 tablet (200 mg total) by mouth 2 (two) times daily. 04/17/22   Danford, Suann Larry, MD  cyanocobalamin (,VITAMIN B-12,) 1000 MCG/ML injection Inject 62m in deltoid once weekly for 4 weeks, then inject 1 ml once a month thereafter 10/23/21   HIsaac Bliss ERayford Halsted MD  doxycycline (VIBRA-TABS) 100 MG tablet Take 1 tablet (100 mg total) by mouth 2 (two) times daily. 06/05/22   Parrett, TFonnie Mu NP  famotidine (PEPCID) 20 MG tablet TAKE ONE TABLET BY MOUTH TWICE DAILY 01/01/22   HIsaac Bliss ERayford Halsted MD  fluticasone-salmeterol ([facility]) 500-50 MCG/ACT AEPB Inhale 1 puff into the lungs in the morning and at bedtime. 04/17/22   Danford, CSuann Larry MD  gabapentin (NEURONTIN) 100 MG capsule TAKE ONE CAPSULE BY MOUTH THREE TIMES DAILY 06/03/22   HIsaac Bliss ERayford Halsted MD  guaiFENesin (MUCINEX) 600 MG 12 hr tablet Take 1 tablet (600 mg total) by mouth 2 (two) times daily. 04/17/22   Danford, CSuann Larry MD   hydrochlorothiazide (HYDRODIURIL) 25 MG tablet TAKE ONE-HALF TO 1 TABLET BY MOUTH DAILY AS NEEDED FOR SWELLING Patient taking differently: Take 12.5 mg by mouth daily as needed (for swelling). 03/24/22   HIsaac Bliss ERayford Halsted MD  ipratropium (ATROVENT) 0.02 % nebulizer solution Take 2.5 mLs (0.5 mg total) by nebulization 2 (two) times daily. 06/05/22   Parrett, TFonnie Mu NP  loratadine (CLARITIN) 10 MG tablet Take 1 tablet (10 mg total) by mouth daily. 10/22/21   HIsaac Bliss ERayford Halsted MD  Magnesium 500 MG TABS Take 1 tablet (500 mg total) by mouth at bedtime. 11/07/20  Isaac Bliss, Rayford Halsted, MD  melatonin 5 MG TABS Take 2 tablets (10 mg total) by mouth at bedtime. 04/01/22   Olalere, Cicero Duck A, MD  metoprolol succinate (TOPROL-XL) 50 MG 24 hr tablet TAKE ONE TABLET BY MOUTH DAILY WITH OR IMMEDIATELY FOLLOWING A MEAL Patient taking differently: Take 50 mg by mouth daily. TAKE ONE TABLET BY MOUTH DAILY WITH OR IMMEDIATELY FOLLOWING A MEAL 01/01/22   Isaac Bliss, Rayford Halsted, MD  mirtazapine (REMERON) 15 MG tablet TAKE ONE TABLET BY MOUTH AT BEDTIME Patient taking differently: Take 15 mg by mouth at bedtime. 11/28/21   Isaac Bliss, Rayford Halsted, MD  pantoprazole (PROTONIX) 40 MG tablet TAKE ONE TABLET BY MOUTH DAILY 05/07/22   Isaac Bliss, Rayford Halsted, MD  pravastatin (PRAVACHOL) 40 MG tablet Take 1 tablet (40 mg total) by mouth every evening. 02/06/22   Isaac Bliss, Rayford Halsted, MD  predniSONE (DELTASONE) 20 MG tablet Take 1 tablet (20 mg total) by mouth daily with breakfast. 06/05/22   Parrett, Tammy S, NP  SYRINGE-NEEDLE, DISP, 3 ML (BD SAFETYGLIDE SYRINGE/NEEDLE) 25G X 1" 3 ML MISC Use for B12 injections 10/23/21   Isaac Bliss, Rayford Halsted, MD  Vitamin D, Ergocalciferol, (DRISDOL) 1.25 MG (50000 UNIT) CAPS capsule TAKE ONE CAPSULE BY MOUTH EVERY WEEK 05/14/21   Isaac Bliss, Rayford Halsted, MD    Physical Exam: Vitals:   06/14/22 1830 06/14/22 1900 06/14/22 1934 06/14/22 1945   BP: 110/63 130/67 (!) 91/56   Pulse: 77 81 80   Resp: (!) 21 (!) 22 (!) 22   Temp:    (!) 97.4 F (36.3 C)  TempSrc:    Oral  SpO2: 100% 100% 100%   Weight:      Height:       Constitutional: NAD, calm, comfortable, non-toxic appearing thin cachetic appearing elderly female laying in bed on Bipap Eyes: lids and conjunctivae normal ENMT: Mucous membranes are moist.  Neck: normal, supple Respiratory: Diminished breath sounds throughout with diffuse wheezing. Normal respiratory effort on Bipap and able to speak in full sentences. No accessory muscle use.  Cardiovascular: Regular rate and rhythm, no murmurs / rubs / gallops. No extremity edema.   Abdomen: soft, no tenderness, non-distended. Bowel sounds positive.  Musculoskeletal: no clubbing / cyanosis. No joint deformity upper and lower extremities. Good ROM, no contractures. Normal muscle tone.  Skin: no rashes, lesions, ulcers. Neurologic: CN 2-12 grossly intact.  Strength 5/5 in all 4.  Psychiatric: Normal judgment and insight. Alert and oriented x 3. Normal mood. Data Reviewed:  See HPI  Assessment and Plan: * Acute on chronic respiratory failure with hypercapnia ([place]) Secondary to COPD exacerbation with underlying sarcoidosis. CO2 up 78 on VBG -Has been on Bipap for close to 4 hrs. Will obtain repeat VBG to see if she can trial off Bipap -scheduled duoneb -start IV solu-medrol -given no other symptoms other than dyspnea, hold on starting antibiotics. CXR did show concerns for possible right upper lobe pneumonia but she is afebrile without leukocytosis. Suspect more scaring from her sarcoidosis. Will check procalcitonin.   Acute metabolic encephalopathy CT head is normal and pt did not appeared altered when I evaluated her after being on Bipap for 2 hrs. -suspect due to hypercapnia  -continue Bipap and treatment for COPD exacerbation and follow clinically  Hypokalemia Replete with IV potassium x2      Advance Care  Planning:   Code Status: Full Code Full  Consults: none  Family Communication: Discussed with daughter over the phone  Severity of Illness: The appropriate patient status for this patient is INPATIENT. Inpatient status is judged to be reasonable and necessary in order to provide the required intensity of service to ensure the patient's safety. The patient's presenting symptoms, physical exam findings, and initial radiographic and laboratory data in the context of their chronic comorbidities is felt to place them at high risk for further clinical deterioration. Furthermore, it is not anticipated that the patient will be medically stable for discharge from the hospital within 2 midnights of admission.   * I certify that at the point of admission it is my clinical judgment that the patient will require inpatient hospital care spanning beyond 2 midnights from the point of admission due to high intensity of service, high risk for further deterioration and high frequency of surveillance required.*  Author: Orene Desanctis, DO 06/14/2022 9:02 PM  For on call review [email or web address].

## 2022-06-14 NOTE — Assessment & Plan Note (Signed)
Replete with IV potassium x2

## 2022-06-14 NOTE — ED Notes (Signed)
Provided ordering bipap for patient. Respiratory contacted at this time and notified of need for bipap

## 2022-06-14 NOTE — ED Triage Notes (Addendum)
Pt coming from home via EMS with c/o weakness today. Pt also reports feeling "out of it" earlier today. 2L O2 Lex[place] baseline. Pt A&Ox4.

## 2022-06-14 NOTE — Assessment & Plan Note (Addendum)
Secondary to COPD exacerbation with underlying sarcoidosis. CO2 up 78 on VBG -Has been on Bipap for close to 4 hrs. Will obtain repeat VBG to see if she can trial off Bipap -scheduled duoneb -start IV solu-medrol -given no other symptoms other than dyspnea, hold on starting antibiotics. CXR did show concerns for possible right upper lobe pneumonia but she is afebrile without leukocytosis. Suspect more scaring from her sarcoidosis. Will check procalcitonin.

## 2022-06-14 NOTE — ED Notes (Signed)
Started [name] on Bipap at 1700.  14/5, rate of 22, and 30% FIO2.  O2 sat=100% HR=79 and RR=23.  She is tolerating these settings well and is drifting in and out of sleep at this time.

## 2022-06-14 NOTE — ED Provider Notes (Signed)
[facility] DEPT Provider Note   CSN: [id number] Arrival date & time: 06/14/22  1453     History  Chief Complaint  Patient presents with   Weakness    [name] is a 76 y.o. female.  76 year old female with prior medical history as detailed below presents for evaluation.  Patient arrives with EMS transport from home.  Patient's daughter called EMS.  Patient reports feeling generalized fatigue and weakness.  She reports that she is weaker today than her baseline.  Patient's daughter interviewed over the phone.  Patient reports that earlier today the patient was resting in her bed.  Patient's daughter and family noted that the patient seemed to be weaker than normal.  Patient reportedly had "twitching of her head" with a very "soft voice".  Patient did not have generalized tonic-clonic seizure activity per daughter's report.  Patient did not have loss of consciousness or change in mentation.  Patient without focal weakness noted.  Described symptoms of "soft voice and head twitching" resolved after approximately 5 to 10 minutes.  Patient reports full recall of the described event earlier today.  Patient without complaint of current or recent focal weakness.  The history is provided by the patient and medical records.       Home Medications Prior to Admission medications   Medication Sig Start Date End Date Taking? Authorizing Provider  acetaminophen (TYLENOL) 500 MG tablet Take 1,000 mg by mouth every 6 (six) hours as needed for moderate pain.    [provider]  albuterol (PROVENTIL) (2.5 MG/3ML) 0.083% nebulizer solution Inhale 3 mLs (2.5 mg total) by nebulization every 6 (six) hours as needed for wheezing. 04/17/22   Danford, Suann Larry, MD  albuterol (VENTOLIN HFA) 108 (90 Base) MCG/ACT inhaler INHALE TWO PUFFS EVERY 6 HOURS AS NEEDED FOR WHEEZING Patient taking differently: Inhale 2 puffs into the lungs every 6 (six) hours as  needed for wheezing. INHALE TWO PUFFS EVERY 6 HOURS AS NEEDED FOR WHEEZING 04/02/22   Isaac Bliss, Rayford Halsted, MD  amLODipine (NORVASC) 5 MG tablet Take 1 tablet (5 mg total) by mouth daily. 03/24/22   Isaac Bliss, Rayford Halsted, MD  benzonatate (TESSALON PERLES) 100 MG capsule Take 1 capsule (100 mg total) by mouth every 6 (six) hours as needed for cough. 04/17/22 04/17/23  Danford, Suann Larry, MD  cefpodoxime (VANTIN) 200 MG tablet Take 1 tablet (200 mg total) by mouth 2 (two) times daily. 04/17/22   Danford, Suann Larry, MD  cyanocobalamin (,VITAMIN B-12,) 1000 MCG/ML injection Inject 53m in deltoid once weekly for 4 weeks, then inject 1 ml once a month thereafter 10/23/21   HIsaac Bliss ERayford Halsted MD  doxycycline (VIBRA-TABS) 100 MG tablet Take 1 tablet (100 mg total) by mouth 2 (two) times daily. 06/05/22   Parrett, TFonnie Mu NP  famotidine (PEPCID) 20 MG tablet TAKE ONE TABLET BY MOUTH TWICE DAILY 01/01/22   HIsaac Bliss ERayford Halsted MD  fluticasone-salmeterol ([facility]) 500-50 MCG/ACT AEPB Inhale 1 puff into the lungs in the morning and at bedtime. 04/17/22   Danford, CSuann Larry MD  gabapentin (NEURONTIN) 100 MG capsule TAKE ONE CAPSULE BY MOUTH THREE TIMES DAILY 06/03/22   HIsaac Bliss ERayford Halsted MD  guaiFENesin (MUCINEX) 600 MG 12 hr tablet Take 1 tablet (600 mg total) by mouth 2 (two) times daily. 04/17/22   Danford, CSuann Larry MD  hydrochlorothiazide (HYDRODIURIL) 25 MG tablet TAKE ONE-HALF TO 1 TABLET BY MOUTH DAILY AS NEEDED FOR SWELLING Patient  taking differently: Take 12.5 mg by mouth daily as needed (for swelling). 03/24/22   Isaac Bliss, Rayford Halsted, MD  ipratropium (ATROVENT) 0.02 % nebulizer solution Take 2.5 mLs (0.5 mg total) by nebulization 2 (two) times daily. 06/05/22   Parrett, Fonnie Mu, NP  loratadine (CLARITIN) 10 MG tablet Take 1 tablet (10 mg total) by mouth daily. 10/22/21   Isaac Bliss, Rayford Halsted, MD  Magnesium 500 MG TABS Take 1 tablet (500 mg total) by  mouth at bedtime. 11/07/20   Isaac Bliss, Rayford Halsted, MD  melatonin 5 MG TABS Take 2 tablets (10 mg total) by mouth at bedtime. 04/01/22   Olalere, Cicero Duck A, MD  metoprolol succinate (TOPROL-XL) 50 MG 24 hr tablet TAKE ONE TABLET BY MOUTH DAILY WITH OR IMMEDIATELY FOLLOWING A MEAL Patient taking differently: Take 50 mg by mouth daily. TAKE ONE TABLET BY MOUTH DAILY WITH OR IMMEDIATELY FOLLOWING A MEAL 01/01/22   Isaac Bliss, Rayford Halsted, MD  mirtazapine (REMERON) 15 MG tablet TAKE ONE TABLET BY MOUTH AT BEDTIME Patient taking differently: Take 15 mg by mouth at bedtime. 11/28/21   Isaac Bliss, Rayford Halsted, MD  pantoprazole (PROTONIX) 40 MG tablet TAKE ONE TABLET BY MOUTH DAILY 05/07/22   Isaac Bliss, Rayford Halsted, MD  pravastatin (PRAVACHOL) 40 MG tablet Take 1 tablet (40 mg total) by mouth every evening. 02/06/22   Isaac Bliss, Rayford Halsted, MD  predniSONE (DELTASONE) 20 MG tablet Take 1 tablet (20 mg total) by mouth daily with breakfast. 06/05/22   Parrett, Tammy S, NP  SYRINGE-NEEDLE, DISP, 3 ML (BD SAFETYGLIDE SYRINGE/NEEDLE) 25G X 1" 3 ML MISC Use for B12 injections 10/23/21   Isaac Bliss, Rayford Halsted, MD  Vitamin D, Ergocalciferol, (DRISDOL) 1.25 MG (50000 UNIT) CAPS capsule TAKE ONE CAPSULE BY MOUTH EVERY WEEK 05/14/21   Isaac Bliss, Rayford Halsted, MD      Allergies    Ace inhibitors, Lisinopril, Valsartan, Aspirin, and Penicillins    Review of Systems   Review of Systems  All other systems reviewed and are negative.   Physical Exam Updated Vital Signs BP 133/70 (BP Location: Left Arm)   Pulse 89   Temp 97.9 F (36.6 C) (Oral)   Resp 16   Ht '5\' 3"'$  (1.6 m)   Wt 44 kg   SpO2 95%   BMI 17.18 kg/m  Physical Exam Vitals and nursing note reviewed.  Constitutional:      General: She is not in acute distress.    Appearance: Normal appearance. She is well-developed.  HENT:     Head: Normocephalic and atraumatic.  Eyes:     Conjunctiva/sclera: Conjunctivae normal.      Pupils: Pupils are equal, round, and reactive to light.  Cardiovascular:     Rate and Rhythm: Normal rate and regular rhythm.     Heart sounds: Normal heart sounds.  Pulmonary:     Effort: Pulmonary effort is normal. No respiratory distress.     Breath sounds: Normal breath sounds.  Abdominal:     General: There is no distension.     Palpations: Abdomen is soft.     Tenderness: There is no abdominal tenderness.  Musculoskeletal:        General: No deformity. Normal range of motion.     Cervical back: Normal range of motion and neck supple.  Skin:    General: Skin is warm and dry.  Neurological:     General: No focal deficit present.     Mental Status: She is alert  and oriented to person, place, and time. Mental status is at baseline.     Cranial Nerves: No cranial nerve deficit.     Sensory: No sensory deficit.     Motor: No weakness.     Coordination: Coordination normal.     ED Results / Procedures / Treatments   Labs (all labs ordered are listed, but only abnormal results are displayed) Labs Reviewed  CBC WITH DIFFERENTIAL/PLATELET - Abnormal; Notable for the following components:      Result Value   RBC 3.66 (*)    Hemoglobin 9.4 (*)    HCT 29.5 (*)    MCH 25.7 (*)    Platelets 401 (*)    All other components within normal limits  COMPREHENSIVE METABOLIC PANEL - Abnormal; Notable for the following components:   Sodium 133 (*)    Potassium 3.4 (*)    Chloride 89 (*)    CO2 37 (*)    Calcium 8.8 (*)    Albumin 2.8 (*)    All other components within normal limits  BLOOD GAS, VENOUS - Abnormal; Notable for the following components:   pCO2, Ven 78 (*)    Bicarbonate 50.6 (*)    Acid-Base Excess 22.6 (*)    All other components within normal limits  BLOOD GAS, VENOUS - Abnormal; Notable for the following components:   pH, Ven 7.5 (*)    pCO2, Ven 61 (*)    pO2, Ven 66 (*)    Bicarbonate 47.6 (*)    Acid-Base Excess 20.7 (*)    All other components within normal  limits  RESP PANEL BY RT-PCR (FLU A&B, COVID) ARPGX2  ETHANOL  URINALYSIS, ROUTINE W REFLEX MICROSCOPIC  PROCALCITONIN  CBC  BASIC METABOLIC PANEL  CBG MONITORING, ED  TROPONIN I (HIGH SENSITIVITY)  TROPONIN I (HIGH SENSITIVITY)    EKG EKG Interpretation  Date/Time:  Saturday June 14 2022 15:09:28 EDT Ventricular Rate:  86 PR Interval:  177 QRS Duration: 97 QT Interval:  400 QTC Calculation: 479 R Axis:   2 Text Interpretation: Sinus rhythm Minimal ST elevation, inferior leads Confirmed by Dene Gentry [id number]) on 06/14/2022 3:15:23 PM  Radiology No results found.  Procedures Procedures    Medications Ordered in ED Medications - No data to display  ED Course/ Medical Decision Making/ A&P                           Medical Decision Making Amount and/or Complexity of Data Reviewed Labs: ordered. Radiology: ordered.  Risk Prescription drug management. Decision regarding hospitalization.    Medical Screen Complete  This patient presented to the ED with complaint of AMS, weakness.  This complaint involves an extensive number of treatment options. The initial differential diagnosis includes, but is not limited to, bacterial versus viral infection, metabolic abnormality, hypercarbia, hypoglycemia, etc.  This presentation is: Acute, Chronic, Self-Limited, Previously Undiagnosed, Uncertain Prognosis, Complicated, Systemic Symptoms, and Threat to Life/Bodily Function  Patient presenting with complaint of weakness and fatigue.  Patient's family is also concerned about decreased level of consciousness at home.  Work-up is suggestive of possible hypercarbia.  Patient with extensive history of COPD/sarcoidosis.  Patient is improved with BiPAP.  Patient would benefit from admission.  Hospital service made aware case and will evaluate for admission.    Additional history obtained:  Additional history obtained from [facility] External records from outside sources  obtained and reviewed including prior ED visits and prior Inpatient records.  Lab Tests:  I ordered and personally interpreted labs.  The pertinent results include: CBC, CMP, EtOH, troponin, COVID, flu, venous blood gas   Imaging Studies ordered:  I ordered imaging studies including chest x-ray I independently visualized and interpreted obtained imaging which showed NAD I agree with the radiologist interpretation.   Cardiac Monitoring:  The patient was maintained on a cardiac monitor.  I personally viewed and interpreted the cardiac monitor which showed an underlying rhythm of: NSR  Problem List / ED Course:  AMS, hypercarbia   Reevaluation:  After the interventions noted above, I reevaluated the patient and found that they have: improved   Disposition:  After consideration of the diagnostic results and the patients response to treatment, I feel that the patent would benefit from admission.    CRITICAL CARE Performed by: Valarie Merino   Total critical care time: 30 minutes  Critical care time was exclusive of separately billable procedures and treating other patients.  Critical care was necessary to treat or prevent imminent or life-threatening deterioration.  Critical care was time spent personally by me on the following activities: development of treatment plan with patient and/or surrogate as well as nursing, discussions with consultants, evaluation of patient's response to treatment, examination of patient, obtaining history from patient or surrogate, ordering and performing treatments and interventions, ordering and review of laboratory studies, ordering and review of radiographic studies, pulse oximetry and re-evaluation of patient's condition.         Final Clinical Impression(s) / ED Diagnoses Final diagnoses:  Altered mental status, unspecified altered mental status type  Hypercarbia    Rx / DC Orders ED Discharge Orders     None          Valarie Merino, MD 06/14/22 2309

## 2022-06-14 NOTE — Assessment & Plan Note (Signed)
CT head is normal and pt did not appeared altered when I evaluated her after being on Bipap for 2 hrs. -suspect due to hypercapnia  -continue Bipap and treatment for COPD exacerbation and follow clinically

## 2022-06-15 DIAGNOSIS — J9622 Acute and chronic respiratory failure with hypercapnia: Secondary | ICD-10-CM | POA: Diagnosis not present

## 2022-06-15 LAB — URINALYSIS, ROUTINE W REFLEX MICROSCOPIC
Bilirubin Urine: NEGATIVE
Glucose, UA: NEGATIVE mg/dL
Hgb urine dipstick: NEGATIVE
Ketones, ur: NEGATIVE mg/dL
Leukocytes,Ua: NEGATIVE
Nitrite: NEGATIVE
Protein, ur: NEGATIVE mg/dL
Specific Gravity, Urine: 1.017 (ref 1.005–1.030)
pH: 7 (ref 5.0–8.0)

## 2022-06-15 LAB — CBC
HCT: 30.2 % — ABNORMAL LOW (ref 36.0–46.0)
Hemoglobin: 9.8 g/dL — ABNORMAL LOW (ref 12.0–15.0)
MCH: 25.5 pg — ABNORMAL LOW (ref 26.0–34.0)
MCHC: 32.5 g/dL (ref 30.0–36.0)
MCV: 78.4 fL — ABNORMAL LOW (ref 80.0–100.0)
Platelets: 488 10*3/uL — ABNORMAL HIGH (ref 150–400)
RBC: 3.85 MIL/uL — ABNORMAL LOW (ref 3.87–5.11)
RDW: 15.6 % — ABNORMAL HIGH (ref 11.5–15.5)
WBC: 4 10*3/uL (ref 4.0–10.5)
nRBC: 0 % (ref 0.0–0.2)

## 2022-06-15 LAB — BASIC METABOLIC PANEL
Anion gap: 10 (ref 5–15)
BUN: 17 mg/dL (ref 8–23)
CO2: 33 mmol/L — ABNORMAL HIGH (ref 22–32)
Calcium: 9.1 mg/dL (ref 8.9–10.3)
Chloride: 90 mmol/L — ABNORMAL LOW (ref 98–111)
Creatinine, Ser: 0.76 mg/dL (ref 0.44–1.00)
GFR, Estimated: >60 mL/min (ref >60)
Glucose, Bld: 115 mg/dL — ABNORMAL HIGH (ref 70–99)
Potassium: 4.1 mmol/L (ref 3.5–5.1)
Sodium: 133 mmol/L — ABNORMAL LOW (ref 135–145)

## 2022-06-15 LAB — PROCALCITONIN: Procalcitonin: <0.1 ng/mL

## 2022-06-15 LAB — MRSA NEXT GEN BY PCR, NASAL: MRSA by PCR Next Gen: NOT DETECTED

## 2022-06-15 MED ORDER — MIRTAZAPINE 15 MG PO TABS
15.0000 mg | ORAL_TABLET | Freq: Every day | ORAL | Status: DC
  Administered 2022-06-15 – 2022-06-17 (×3): 15 mg via ORAL
  Filled 2022-06-15 (×3): qty 1

## 2022-06-15 MED ORDER — PANTOPRAZOLE SODIUM 40 MG PO TBEC
40.0000 mg | DELAYED_RELEASE_TABLET | Freq: Every day | ORAL | Status: DC
  Administered 2022-06-15 – 2022-06-17 (×3): 40 mg via ORAL
  Filled 2022-06-15 (×3): qty 1

## 2022-06-15 MED ORDER — MAGNESIUM 500 MG PO TABS: 500.0000 mg | ORAL_TABLET | Freq: Every day | ORAL | Status: DC

## 2022-06-15 MED ORDER — LORATADINE 10 MG PO TABS
10.0000 mg | ORAL_TABLET | Freq: Every day | ORAL | Status: DC
  Administered 2022-06-15 – 2022-06-17 (×3): 10 mg via ORAL
  Filled 2022-06-15 (×3): qty 1

## 2022-06-15 MED ORDER — ACETAMINOPHEN 325 MG PO TABS
650.0000 mg | ORAL_TABLET | Freq: Four times a day (QID) | ORAL | Status: AC | PRN
  Administered 2022-06-15 – 2022-06-16 (×2): 650 mg via ORAL
  Filled 2022-06-15 (×2): qty 2

## 2022-06-15 MED ORDER — TRAMADOL HCL 50 MG PO TABS
50.0000 mg | ORAL_TABLET | Freq: Four times a day (QID) | ORAL | Status: DC | PRN
  Administered 2022-06-15 – 2022-06-17 (×3): 50 mg via ORAL
  Filled 2022-06-15 (×3): qty 1

## 2022-06-15 MED ORDER — MELATONIN 5 MG PO TABS
5.0000 mg | ORAL_TABLET | Freq: Every day | ORAL | Status: DC
  Administered 2022-06-15 – 2022-06-17 (×3): 5 mg via ORAL
  Filled 2022-06-15 (×3): qty 1

## 2022-06-15 MED ORDER — METOPROLOL SUCCINATE ER 50 MG PO TB24
50.0000 mg | ORAL_TABLET | Freq: Every day | ORAL | Status: DC
  Administered 2022-06-15 – 2022-06-17 (×3): 50 mg via ORAL
  Filled 2022-06-15 (×2): qty 1
  Filled 2022-06-15: qty 2

## 2022-06-15 MED ORDER — AMLODIPINE BESYLATE 5 MG PO TABS
5.0000 mg | ORAL_TABLET | Freq: Every day | ORAL | Status: DC
  Administered 2022-06-15 – 2022-06-17 (×3): 5 mg via ORAL
  Filled 2022-06-15 (×3): qty 1

## 2022-06-15 MED ORDER — PRAVASTATIN SODIUM 40 MG PO TABS
40.0000 mg | ORAL_TABLET | Freq: Every evening | ORAL | Status: DC
  Administered 2022-06-15 – 2022-06-17 (×3): 40 mg via ORAL
  Filled 2022-06-15: qty 2
  Filled 2022-06-15 (×2): qty 1

## 2022-06-15 MED ORDER — MAGNESIUM GLUCONATE 500 MG PO TABS
500.0000 mg | ORAL_TABLET | Freq: Every day | ORAL | Status: DC
  Administered 2022-06-15 – 2022-06-17 (×3): 500 mg via ORAL
  Filled 2022-06-15 (×3): qty 1

## 2022-06-15 MED ORDER — GABAPENTIN 100 MG PO CAPS
100.0000 mg | ORAL_CAPSULE | Freq: Three times a day (TID) | ORAL | Status: DC
  Administered 2022-06-15 – 2022-06-17 (×9): 100 mg via ORAL
  Filled 2022-06-15 (×9): qty 1

## 2022-06-15 MED ORDER — MOMETASONE FURO-FORMOTEROL FUM 200-5 MCG/ACT IN AERO
2.0000 | INHALATION_SPRAY | Freq: Two times a day (BID) | RESPIRATORY_TRACT | Status: DC
  Administered 2022-06-15 – 2022-06-17 (×5): 2 via RESPIRATORY_TRACT
  Filled 2022-06-15: qty 8.8

## 2022-06-15 MED ORDER — CHLORHEXIDINE GLUCONATE CLOTH 2 % EX PADS
6.0000 | MEDICATED_PAD | Freq: Every day | CUTANEOUS | Status: DC
  Administered 2022-06-15 – 2022-06-17 (×2): 6 via TOPICAL

## 2022-06-15 MED ORDER — FAMOTIDINE 20 MG PO TABS
20.0000 mg | ORAL_TABLET | Freq: Two times a day (BID) | ORAL | Status: DC
  Administered 2022-06-15 – 2022-06-17 (×6): 20 mg via ORAL
  Filled 2022-06-15 (×6): qty 1

## 2022-06-15 NOTE — Progress Notes (Signed)
PROGRESS NOTE  [name]  DOB: 09-08-1945  PCP: Isaac Bliss, Rayford Halsted, MD YKD:[id number]  DOA: 06/14/2022  LOS: 1 day  Hospital Day: 2  Brief narrative: ATIANA [name] is a 76 y.o. 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 Patient presented to the ED from home with complaint of altered mental status and shortness of breath. Patient reports dyspnea ongoing for a week.  11/4, home health nurse came in increased oxygen supplementation from 2 to 3 L by nasal cannula.  He was also noted to be slow to respond and hence sent to the ED. Labs with WBC count 7, hemoglobin 9.4, platelet 4.1, sodium 133, potassium 3.4, renal function normal, procalcitonin not elevated.  In the ED, she was afebrile, normotension and required Bipap. VBG with ph of 7.42, CO2 of 78 and Bicarb of 50.  VBG with pH 7.42, PCO2 elevated to 78, bicarb 51. Patient was placed on BiPAP.  Repeat VBG after several hours showed an improvement in PCO2 to 61. CT head negative. Chest x-ray showed right upper lobe opacities. Negative flu/COVID PCR Admitted to [facility]  Subjective: Patient was seen and examined this morning.  Pleasant elderly African-American female.  Alert, awake, oriented x3 on BiPAP.  Right Hayley the time of my evaluation, respiratory has removed her BiPAP.  Feels better without BiPAP.  Able to maintain oxygen saturation on 3 L oxygen by nasal cannula. Chart reviewed. No fever, heart rate 90s, blood pressure in 150s this morning.  Assessment and plan: Acute on chronic respiratory failure with hypercapnia COPD exacerbation Presented with progressive shortness of breath, PCO2 elevated up to 78 on VBG Kept on BiPAP overnight. Patient has not been initiated on antibiotics since patient has no fever, WBC count and procalcitonin level are normal Currently on IV Solu-Medrol 40 mg daily, Mucinex, bronchodilators. Recent Labs  Lab 06/14/22 1627  06/14/22 2230  WBC 7.0  --   PROCALCITON  --  <0.10   Pulmonary sarcoidosis Patient apparently is not on prednisone daily Hayley home. Follows up with   Acute metabolic encephalopathy Secondary to hypercapnia.  CT head unremarkable.   Hypokalemia Potassium level was low Hayley 3.4 on admission.  Replacement given.  Repeat labs tomorrow. Recent Labs  Lab 06/14/22 1627  K 3.4*   Essential hypertension PTA on amlodipine 5 mg daily, Metoprolol 50 mg daily, HCTZ 12.5 mg daily as needed Currently continued on metoprolol and amlodipine.  HCTZ remain on hold.  Resume once blood pressure stabilizes.  HLD Pravastatin 40 mg daily  GERD PPI   Goals of care   Code Status: Full Code    Mobility: Encourage ambulation  Skin assessment:     Nutritional status:  Body mass index is 17.34 kg/m.          Diet:  Diet Order             Diet regular Room service appropriate? Yes; Fluid consistency: Thin  Diet effective now                   DVT prophylaxis:  enoxaparin (LOVENOX) injection 30 mg Start: 06/14/22 2200   Antimicrobials: None Fluid: None Consultants: None Family Communication: None Hayley bedside  Status is: Inpatient  Continue in-hospital care because: Monitor respiratory status Level of care: Stepdown   Dispo: The patient is from: Home              Anticipated d/c is to:  If respiratory status remains stable, expect discharge in next 1 to 2 days              Patient currently is not medically stable to d/c.   Difficult to place patient No     Infusions:    Scheduled Meds:  amLODipine  5 mg Oral Daily   Chlorhexidine Gluconate Cloth  6 each Topical Daily   enoxaparin (LOVENOX) injection  30 mg Subcutaneous Q24H   famotidine  20 mg Oral BID   gabapentin  100 mg Oral TID   ipratropium-albuterol  3 mL Nebulization TID   loratadine  10 mg Oral Daily   magnesium gluconate  500 mg Oral QHS   melatonin  5 mg Oral QHS   methylPREDNISolone (SOLU-MEDROL)  injection  40 mg Intravenous Daily   metoprolol succinate  50 mg Oral Daily   mirtazapine  15 mg Oral QHS   mometasone-formoterol  2 puff Inhalation BID   pantoprazole  40 mg Oral Daily   pravastatin  40 mg Oral QPM    PRN meds:    Antimicrobials: Anti-infectives (From admission, onward)    None       Objective: Vitals:   06/15/22 1200 06/15/22 1300  BP: 112/65 (!) 154/62  Pulse: 86 99  Resp: (!) 25 (!) 27  Temp:  98.2 F (36.8 C)  SpO2: 98% 100%   No intake or output data in the 24 hours ending 06/15/22 1412 Filed Weights   06/14/22 1513 06/15/22 1300  Weight: 44 kg 44.4 kg   Weight change:  Body mass index is 17.34 kg/m.   Physical Exam: General exam: Pleasant, elderly African-American female. Skin: No rashes, lesions or ulcers. HEENT: Atraumatic, normocephalic, no obvious bleeding Lungs: Clear to auscultation bilaterally CVS: Regular rate and rhythm, no murmur GI/Abd soft, nontender, nondistended, bowel sound present CNS: Alert, awake, oriented x3 Psychiatry: Mood appropriate Extremities: No pedal edema, no calf tenderness  Data Review: I have personally reviewed the laboratory data and studies available.  F/u labs ordered Unresulted Labs (From admission, onward)     Start     Ordered   06/15/22 1304  MRSA Next Gen by PCR, Nasal  Once,   R        06/15/22 1303   06/15/22 0500  CBC  Tomorrow morning,   R        06/14/22 2054   06/15/22 0254  Basic metabolic panel  Tomorrow morning,   R        06/14/22 2054            Signed, Terrilee Croak, MD [facility] 06/15/2022

## 2022-06-15 NOTE — Progress Notes (Signed)
Per  Dr Pietro Cassis pt taken off bipap at this time, per MD, pt to use bipap qhs, pt states she uses 2.5-3lpm at home

## 2022-06-16 DIAGNOSIS — J9622 Acute and chronic respiratory failure with hypercapnia: Secondary | ICD-10-CM | POA: Diagnosis not present

## 2022-06-16 LAB — CBC
HCT: 37.6 % (ref 36.0–46.0)
Hemoglobin: 12.1 g/dL (ref 12.0–15.0)
MCH: 25.3 pg — ABNORMAL LOW (ref 26.0–34.0)
MCHC: 32.2 g/dL (ref 30.0–36.0)
MCV: 78.5 fL — ABNORMAL LOW (ref 80.0–100.0)
Platelets: 178 10*3/uL (ref 150–400)
RBC: 4.79 MIL/uL (ref 3.87–5.11)
RDW: 15.6 % — ABNORMAL HIGH (ref 11.5–15.5)
WBC: 7.5 10*3/uL (ref 4.0–10.5)
nRBC: 0 % (ref 0.0–0.2)

## 2022-06-16 LAB — BASIC METABOLIC PANEL
Anion gap: 12 (ref 5–15)
BUN: 14 mg/dL (ref 8–23)
CO2: 31 mmol/L (ref 22–32)
Calcium: 8.8 mg/dL — ABNORMAL LOW (ref 8.9–10.3)
Chloride: 93 mmol/L — ABNORMAL LOW (ref 98–111)
Creatinine, Ser: 0.84 mg/dL (ref 0.44–1.00)
GFR, Estimated: >60 mL/min (ref >60)
Glucose, Bld: 102 mg/dL — ABNORMAL HIGH (ref 70–99)
Potassium: 3.7 mmol/L (ref 3.5–5.1)
Sodium: 136 mmol/L (ref 135–145)

## 2022-06-16 MED ORDER — HYDROCHLOROTHIAZIDE 12.5 MG PO TABS
12.5000 mg | ORAL_TABLET | Freq: Every day | ORAL | Status: DC
  Administered 2022-06-16 – 2022-06-17 (×2): 12.5 mg via ORAL
  Filled 2022-06-16 (×2): qty 1

## 2022-06-16 NOTE — Progress Notes (Signed)
PROGRESS NOTE  [name]  DOB: 05-31-1946  PCP: Isaac Bliss, Rayford Halsted, MD VOZ:[id number]  DOA: 06/14/2022  LOS: 2 days  Hospital Day: 3  Brief narrative: [name] is a 76 y.o. 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 Patient presented to the ED from home with complaint of altered mental status and shortness of breath. Patient reports dyspnea ongoing for a week.  11/4, home health nurse came in increased oxygen supplementation from 2 to 3 L by nasal cannula.  He was also noted to be slow to respond and hence sent to the ED. Labs with WBC count 7, hemoglobin 9.4, platelet 4.1, sodium 133, potassium 3.4, renal function normal, procalcitonin not elevated.  In the ED, she was afebrile, normotension and required Bipap. VBG with ph of 7.42, CO2 of 78 and Bicarb of 50.  VBG with pH 7.42, PCO2 elevated to 78, bicarb 51. Patient was placed on BiPAP.  Repeat VBG after several hours showed an improvement in PCO2 to 61. CT head negative. Chest x-ray showed right upper lobe opacities. Negative flu/COVID PCR Admitted to [facility]  Subjective: Patient was seen and examined this morning.  Lying on bed.  Not in distress.  On O2 by nasal cannula last night.  Did not use BiPAP.  Feels good this morning but on minimal ambulation feels palpitation.  Does not feel ready to go home today.   Assessment and plan: Acute on chronic respiratory failure with hypercapnia COPD exacerbation Presented with progressive shortness of breath, PCO2 elevated up to 78 on VBG She was kept on BiPAP overnight and subsequently transitioned to oxygen by nasal cannula. Patient was not started on antibiotics since patient has no fever, WBC count and procalcitonin level are normal Currently on IV Solu-Medrol 40 mg daily, Mucinex, bronchodilators. Seems to be gradually improving.  Ambulate in the hallway today to check for oxygen requirement Recent Labs   Lab 06/14/22 1627 06/14/22 2230 06/15/22 1348 06/16/22 0910  WBC 7.0  --  4.0 7.5  PROCALCITON  --  <0.10  --   --    Pulmonary sarcoidosis Patient apparently is not on prednisone daily at home. Follows up with pulmonologist Dr. Ander Slade as an outpatient   Acute metabolic encephalopathy Secondary to hypercapnia.  CT head unremarkable.   Hypokalemia Potassium level was low at 3.4 on admission.  Replacement given.  Repeat labs tomorrow. Recent Labs  Lab 06/14/22 1627 06/15/22 1348 06/16/22 1040  K 3.4* 4.1 3.7   Essential hypertension PTA on amlodipine 5 mg daily, Metoprolol 50 mg daily, HCTZ 12.5 mg daily as needed Currently continued on metoprolol and amlodipine.  HCTZ remain on hold.  Blood pressure running elevated to 150s.  Resume HCTZ today. HLD Pravastatin 40 mg daily  GERD PPI   Goals of care   Code Status: Full Code    Mobility: Encourage ambulation  Skin assessment:     Nutritional status:  Body mass index is 17.34 kg/m.          Diet:  Diet Order             Diet regular Room service appropriate? Yes; Fluid consistency: Thin  Diet effective now                   DVT prophylaxis:  enoxaparin (LOVENOX) injection 30 mg Start: 06/14/22 2200   Antimicrobials: None Fluid: None Consultants: None Family Communication: None at bedside  Status  is: Inpatient  Continue in-hospital care because: Monitor respiratory status Level of care: Telemetry   Dispo: The patient is from: Home              Anticipated d/c is to: Hopefully home tomorrow              Patient currently is not medically stable to d/c.   Difficult to place patient No     Infusions:    Scheduled Meds:  amLODipine  5 mg Oral Daily   Chlorhexidine Gluconate Cloth  6 each Topical Daily   enoxaparin (LOVENOX) injection  30 mg Subcutaneous Q24H   famotidine  20 mg Oral BID   gabapentin  100 mg Oral TID   hydrochlorothiazide  12.5 mg Oral Daily    ipratropium-albuterol  3 mL Nebulization TID   loratadine  10 mg Oral Daily   magnesium gluconate  500 mg Oral QHS   melatonin  5 mg Oral QHS   methylPREDNISolone (SOLU-MEDROL) injection  40 mg Intravenous Daily   metoprolol succinate  50 mg Oral Daily   mirtazapine  15 mg Oral QHS   mometasone-formoterol  2 puff Inhalation BID   pantoprazole  40 mg Oral Daily   pravastatin  40 mg Oral QPM    PRN meds:    Antimicrobials: Anti-infectives (From admission, onward)    None       Objective: Vitals:   06/16/22 1200 06/16/22 1318  BP: (!) 153/88 (!) 140/82  Pulse: 92 99  Resp: (!) 23 18  Temp:  97.9 F (36.6 C)  SpO2: 97% 97%    Intake/Output Summary (Last 24 hours) at 06/16/2022 1438 Last data filed at 06/16/2022 0700 Gross per 24 hour  Intake 240 ml  Output 1100 ml  Net -860 ml   Filed Weights   06/14/22 1513 06/15/22 1300  Weight: 44 kg 44.4 kg   Weight change: 0.401 kg Body mass index is 17.34 kg/m.   Physical Exam: General exam: Pleasant, elderly African-American female. Skin: No rashes, lesions or ulcers. HEENT: Atraumatic, normocephalic, no obvious bleeding Lungs: Clear to auscultation bilaterally.  Coughs on deep breathing CVS: Regular rate and rhythm, no murmur GI/Abd soft, nontender, nondistended, bowel sound present CNS: Alert, awake, oriented x3 Psychiatry: Mood appropriate Extremities: No pedal edema, no calf tenderness  Data Review: I have personally reviewed the laboratory data and studies available.  F/u labs ordered Unresulted Labs (From admission, onward)    None       Signed, Terrilee Croak, MD [facility] 06/16/2022

## 2022-06-16 NOTE — Progress Notes (Signed)
Attempted to obtain walking ambulatory stat in hallway, patient declined at this time due to leg pain. Tylenol given. Will attempt at later time.

## 2022-06-17 MED ORDER — PREDNISONE 10 MG PO TABS: ORAL_TABLET | ORAL | 0 refills | Status: DC

## 2022-06-17 MED ORDER — GUAIFENESIN ER 600 MG PO TB12: 600.0000 mg | ORAL_TABLET | Freq: Two times a day (BID) | ORAL | Status: AC

## 2022-06-17 NOTE — Care Management Important Message (Signed)
Important Message  Patient Details IM Letter given Name: [name] MRN: [id number] Date of Birth: 10/07/1945   Medicare Important Message Given:  Yes     Kerin Salen 06/17/2022, 11:06 AM

## 2022-06-17 NOTE — TOC Transition Note (Addendum)
Transition of Care [facility]) - CM/SW Discharge Note   Patient Details  Name: [name] MRN: [id number] Date of Birth: 05-01-1946  [facility]) CM/SW Contact:  Dessa Phi, RN Phone Number: 06/17/2022, 1:04 PM   Clinical Narrative: Active w/[facility]-HHRN/PT/OT;has home 02. No further CM needs.  -3:19p-Patient/dtr in agreement to d/c home. They will manage own transportation. No further CM needs.     Final next level of care: [place] Barriers to Discharge: No Barriers Identified   Patient Goals and CMS Choice Patient states their goals for this hospitalization and ongoing recovery are::  (home)   Choice offered to / list presented to : Patient  Discharge Placement                       Discharge Plan and Services   Discharge Planning Services: CM Consult Post Acute Care Choice: Home Health, Durable Medical Equipment                               Social Determinants of Health (SDOH) Interventions     Readmission Risk Interventions    04/17/2022   10:06 AM  Readmission Risk Prevention Plan  Transportation Screening Complete  PCP or Specialist Appt within 5-7 Days Complete  Home Care Screening Complete  Medication Review (RN CM) Complete

## 2022-06-17 NOTE — Discharge Summary (Signed)
Physician Discharge Summary  MERDIS SNODGRASS KVQ:[id number] DOB: Mar 11, 1946 DOA: 06/14/2022  PCP: Isaac Bliss, Rayford Halsted, MD  Admit date: 06/14/2022 Discharge date: 06/17/2022  Admitted From: Home Discharge disposition: Home  Recommendations at discharge:  Completed tapering course of prednisone Follow-up with pulmonologist as an outpatient.   Brief narrative: [name] is a 76 y.o. 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 Patient presented to the ED from home with complaint of altered mental status and shortness of breath. Patient reports dyspnea ongoing for a week.  11/4, home health nurse came in increased oxygen supplementation from 2 to 3 L by nasal cannula.  He was also noted to be slow to respond and hence sent to the ED. Labs with WBC count 7, hemoglobin 9.4, platelet 4.1, sodium 133, potassium 3.4, renal function normal, procalcitonin not elevated.  In the ED, she was afebrile, normotension and required Bipap. VBG with ph of 7.42, CO2 of 78 and Bicarb of 50.  VBG with pH 7.42, PCO2 elevated to 78, bicarb 51. Patient was placed on BiPAP.  Repeat VBG after several hours showed an improvement in PCO2 to 61. CT head negative. Chest x-ray showed right upper lobe opacities. Negative flu/COVID PCR Admitted to [facility]  Subjective: Patient was seen and examined this morning.  Lying on bed.  Not in distress.  On 2 L oxygen nasal collar.  Clear to auscultation bilaterally.   Hospital course : Acute on chronic respiratory failure with hypercapnia COPD exacerbation Presented with progressive shortness of breath, PCO2 elevated up to 78 on VBG She was kept on BiPAP overnight and subsequently transitioned to oxygen by nasal cannula. Patient was not started on antibiotics since patient has no fever, WBC count and procalcitonin level are normal She was started on IV Solu-Medrol 40 mg daily, Mucinex,  bronchodilators. Clinically improved. We will discharge her on tapering course of steroids today.  Continue Mucinex.  Continue bronchodilators as before. Recent Labs  Lab 06/14/22 1627 06/14/22 2230 06/15/22 1348 06/16/22 0910  WBC 7.0  --  4.0 7.5  PROCALCITON  --  <0.10  --   --    Pulmonary sarcoidosis Patient apparently is not on prednisone daily at home. Follows up with pulmonologist Dr. Ander Slade as an outpatient.  For now, patient will be on a tapering course of steroids.  To follow-up with pulmonologist for to see if she qualifies for prednisone daily.   Acute metabolic encephalopathy Secondary to hypercapnia.  CT head unremarkable.   Essential hypertension PTA on amlodipine 5 mg daily, Metoprolol 50 mg daily, HCTZ 12.5 mg daily as needed. Continue the same.  HLD Pravastatin 40 mg daily  GERD PPI  Wounds:  - Incision (Closed) 09/15/21 Hip Right (Active)  Date First Assessed/Time First Assessed: 09/15/21 1623   Location: Hip  Location Orientation: Right    Assessments 09/15/2021  4:36 PM 09/15/2021  5:03 PM  Dressing Other (Comment) --  Site / Wound Assessment Clean;Dry Clean;Dry  Drainage Amount -- None     No associated orders.    Discharge Exam:   Vitals:   06/17/22 0601 06/17/22 0755 06/17/22 1244 06/17/22 1356  BP: (!) 152/83  123/68   Pulse: 93  92   Resp: 17  18   Temp: (!) 97.3 F (36.3 C)  98.2 F (36.8 C)   TempSrc: Oral  Oral   SpO2: 100% 100% 97% 97%  Weight:      Height:  Body mass index is 17.34 kg/m.  General exam: Pleasant, elderly African-American female. Skin: No rashes, lesions or ulcers. HEENT: Atraumatic, normocephalic, no obvious bleeding Lungs: Clear to auscultation bilaterally but coughs on deep breathing. CVS: Regular rate and rhythm, no murmur GI/Abd soft, nontender, nondistended, bowel sound present CNS: Alert, awake, oriented x3 Psychiatry: Mood appropriate Extremities: No pedal edema, no calf tenderness  Follow  ups:    Follow-up Information     Isaac Bliss, Rayford Halsted, MD Follow up.   Specialty: Internal Medicine Contact information: [place] [address] [place], [facility] [place] [facility] Follow up.   Specialty: [place] Why: [facility] nursing/physical therapy/occupational therapy Contact information: [place] [address] [phone]                 Discharge Instructions:   Discharge Instructions     Call MD for:  difficulty breathing, headache or visual disturbances   Complete by: As directed    Call MD for:  extreme fatigue   Complete by: As directed    Call MD for:  hives   Complete by: As directed    Call MD for:  persistant dizziness or light-headedness   Complete by: As directed    Call MD for:  persistant nausea and vomiting   Complete by: As directed    Call MD for:  severe uncontrolled pain   Complete by: As directed    Call MD for:  temperature >100.4   Complete by: As directed    Diet general   Complete by: As directed    Discharge instructions   Complete by: As directed    Recommendations at discharge:   Completed tapering course of prednisone  Follow-up with pulmonologist as an outpatient.  General discharge instructions: Follow with Primary MD Isaac Bliss, Rayford Halsted, MD in 7 days  Please request your PCP  to go over your hospital tests, procedures, radiology results at the follow up. Please get your medicines reviewed and adjusted.  Your PCP may decide to repeat certain labs or tests as needed. Do not drive, operate heavy machinery, perform activities at heights, swimming or participation in water activities or provide baby sitting services if your were admitted for syncope or siezures until you have seen by Primary MD or a Neurologist and advised to do so again. [place] Controlled Substance Reporting System database was reviewed. Do not drive, operate  heavy machinery, perform activities at heights, swim, participate in water activities or provide baby-sitting services while on medications for pain, sleep and mood until your outpatient physician has reevaluated you and advised to do so again.  You are strongly recommended to comply with the dose, frequency and duration of prescribed medications. Activity: As tolerated with Full fall precautions use walker/cane & assistance as needed Avoid using any recreational substances like cigarette, tobacco, alcohol, or non-prescribed drug. If you experience worsening of your admission symptoms, develop shortness of breath, life threatening emergency, suicidal or homicidal thoughts you must seek medical attention immediately by calling 911 or calling your MD immediately  if symptoms less severe. You must read complete instructions/literature along with all the possible adverse reactions/side effects for all the medicines you take and that have been prescribed to you. Take any new medicine only after you have completely understood and accepted all the possible adverse reactions/side effects.  Wear Seat belts while driving. You were cared for by  a hospitalist during your hospital stay. If you have any questions about your discharge medications or the care you received while you were in the hospital after you are discharged, you can call the unit and ask to speak with the hospitalist or the covering physician. Once you are discharged, your primary care physician will handle any further medical issues. Please note that NO REFILLS for any discharge medications will be authorized once you are discharged, as it is imperative that you return to your primary care physician (or establish a relationship with a primary care physician if you do not have one).   Increase activity slowly   Complete by: As directed        Discharge Medications:   Allergies as of 06/17/2022       Reactions   Ace Inhibitors Swelling    Lisinopril Swelling   REACTION: Angioedema   Valsartan Swelling   REACTION: angioedema.  Pt should not get ACEI or ARBs of any kind due to angioedema.   Aspirin Other (See Comments)   REACTION: GI ulcer.   Penicillins    Childhood allergy Has patient had a PCN reaction causing immediate rash, facial/tongue/throat swelling, SOB or lightheadedness with hypotension: Unknown Has patient had a PCN reaction causing severe rash involving mucus membranes or skin necrosis: Unknown Has patient had a PCN reaction that required hospitalization: Unknown Has patient had a PCN reaction occurring within the last 10 years: Unknown If all of the above answers are "NO", then may proceed with Cephalosporin use.        Medication List     STOP taking these medications    BD SafetyGlide Syringe/Needle 25G X 1" 3 ML Misc Generic drug: SYRINGE-NEEDLE (DISP) 3 ML   benzonatate 100 MG capsule Commonly known as: Tessalon Perles   cefpodoxime 200 MG tablet Commonly known as: VANTIN   cyanocobalamin 1000 MCG/ML injection Commonly known as: VITAMIN B12   doxycycline 100 MG tablet Commonly known as: VIBRA-TABS   Magnesium 500 MG Tabs   Vitamin D (Ergocalciferol) 1.25 MG (50000 UNIT) Caps capsule Commonly known as: DRISDOL       TAKE these medications    acetaminophen 500 MG tablet Commonly known as: TYLENOL Take 1,000 mg by mouth every 6 (six) hours as needed for moderate pain.   albuterol 108 (90 Base) MCG/ACT inhaler Commonly known as: VENTOLIN HFA INHALE TWO PUFFS EVERY 6 HOURS AS NEEDED FOR WHEEZING What changed: See the new instructions.   albuterol (2.5 MG/3ML) 0.083% nebulizer solution Commonly known as: PROVENTIL Inhale 3 mLs (2.5 mg total) by nebulization every 6 (six) hours as needed for wheezing. What changed: Another medication with the same name was changed. Make sure you understand how and when to take each.   amLODipine 5 MG tablet Commonly known as: NORVASC Take 1  tablet (5 mg total) by mouth daily.   famotidine 20 MG tablet Commonly known as: PEPCID TAKE ONE TABLET BY MOUTH TWICE DAILY   fluticasone-salmeterol 500-50 MCG/ACT Aepb Commonly known as: Wixela Inhub Inhale 1 puff into the lungs in the morning and at bedtime.   gabapentin 100 MG capsule Commonly known as: NEURONTIN TAKE ONE CAPSULE BY MOUTH THREE TIMES DAILY What changed:  how much to take how to take this when to take this additional instructions   guaiFENesin 600 MG 12 hr tablet Commonly known as: MUCINEX Take 1 tablet (600 mg total) by mouth 2 (two) times daily for 7 days.   hydrochlorothiazide 25 MG tablet Commonly known  as: HYDRODIURIL TAKE ONE-HALF TO 1 TABLET BY MOUTH DAILY AS NEEDED FOR SWELLING What changed:  how much to take how to take this when to take this reasons to take this additional instructions   ipratropium 0.02 % nebulizer solution Commonly known as: ATROVENT Take 2.5 mLs (0.5 mg total) by nebulization 2 (two) times daily.   loratadine 10 MG tablet Commonly known as: CLARITIN Take 1 tablet (10 mg total) by mouth daily.   melatonin 5 MG Tabs Take 2 tablets (10 mg total) by mouth at bedtime. What changed: how much to take   metoprolol succinate 50 MG 24 hr tablet Commonly known as: TOPROL-XL TAKE ONE TABLET BY MOUTH DAILY WITH OR IMMEDIATELY FOLLOWING A MEAL What changed:  how much to take how to take this when to take this   mirtazapine 15 MG tablet Commonly known as: REMERON TAKE ONE TABLET BY MOUTH AT BEDTIME   pantoprazole 40 MG tablet Commonly known as: PROTONIX TAKE ONE TABLET BY MOUTH DAILY   pravastatin 40 MG tablet Commonly known as: PRAVACHOL Take 1 tablet (40 mg total) by mouth every evening.   predniSONE 10 MG tablet Commonly known as: DELTASONE Take 4 tablets daily X 2 days, then, Take 3 tablets daily X 2 days, then, Take 2 tablets daily X 2 days, then, Take 1 tablets daily X 1 day. What changed:  medication  strength how much to take how to take this when to take this additional instructions         The results of significant diagnostics from this hospitalization (including imaging, microbiology, ancillary and laboratory) are listed below for reference.    Procedures and Diagnostic Studies:   DG Chest Port 1 View  Result Date: 06/14/2022 CLINICAL DATA:  Shortness of breath. Weakness today. History of COPD Clips in the gallbladder fossa postcholecystectomy. No biliary dilatation. Ptosis. EXAM: PORTABLE CHEST 1 VIEW COMPARISON:  Chest radiograph 06/05/2022.  Chest CT 04/14/2022 FINDINGS: Lower lung volumes from prior exam. Stable heart size, normal for technique. Extensive calcified mediastinal and hilar adenopathy related sarcoidosis. New opacity in the periphery of the right upper lobe. There is bilateral hilar retraction and scarring in the upper lung zones, stable. Additional areas of scarring throughout both lungs, also stable. No pneumothorax or large pleural effusion. IMPRESSION: 1. New opacity in the periphery of the right upper lobe, may be atelectasis or pneumonia. 2. Lower lung volumes from prior exam. 3. Chronic changes related sarcoidosis with multifocal scarring, calcified mediastinal and hilar adenopathy and chronic hilar retraction. Electronically Signed   By: Keith Rake M.D.   On: 06/14/2022 16:05   CT Head Wo Contrast  Result Date: 06/14/2022 CLINICAL DATA:  Weakness and altered mental status. EXAM: CT HEAD WITHOUT CONTRAST TECHNIQUE: Contiguous axial images were obtained from the base of the skull through the vertex without intravenous contrast. RADIATION DOSE REDUCTION: This exam was performed according to the departmental dose-optimization program which includes automated exposure control, adjustment of the mA and/or kV according to patient size and/or use of iterative reconstruction technique. COMPARISON:  Head CT 04/14/2022 FINDINGS: Brain: No intracranial hemorrhage, mass  effect, or midline shift. Normal brain volume for age no hydrocephalus. The basilar cisterns are patent. Stable degree of moderate periventricular and deep white matter hypodensity typical of chronic small vessel ischemia. No evidence of territorial infarct or acute ischemia. No extra-axial or intracranial fluid collection. Vascular: Atherosclerosis of skullbase vasculature without hyperdense vessel or abnormal calcification. Skull: No fracture or focal lesion. Sinuses/Orbits: Paranasal sinuses  and mastoid air cells are clear. The visualized orbits are unremarkable. Other: None. IMPRESSION: 1. No acute intracranial abnormality. 2. Stable chronic small vessel ischemia. Electronically Signed   By: Keith Rake M.D.   On: 06/14/2022 15:55     Labs:   Basic Metabolic Panel: Recent Labs  Lab 06/14/22 1627 06/15/22 1348 06/16/22 1040  NA 133* 133* 136  K 3.4* 4.1 3.7  CL 89* 90* 93*  CO2 37* 33* 31  GLUCOSE 95 115* 102*  BUN '13 17 14  '$ CREATININE 0.82 0.76 0.84  CALCIUM 8.8* 9.1 8.8*   GFR Estimated Creatinine Clearance: 39.9 mL/min (by C-G formula based on SCr of 0.84 mg/dL). Liver Function Tests: Recent Labs  Lab 06/14/22 1627  AST 16  ALT 12  ALKPHOS 72  BILITOT 0.5  PROT 6.5  ALBUMIN 2.8*   No results for input(s): "LIPASE", "AMYLASE" in the last 168 hours. No results for input(s): "AMMONIA" in the last 168 hours. Coagulation profile No results for input(s): "INR", "PROTIME" in the last 168 hours.  CBC: Recent Labs  Lab 06/14/22 1627 06/15/22 1348 06/16/22 0910  WBC 7.0 4.0 7.5  NEUTROABS 4.4  --   --   HGB 9.4* 9.8* 12.1  HCT 29.5* 30.2* 37.6  MCV 80.6 78.4* 78.5*  PLT 401* 488* 178   Cardiac Enzymes: No results for input(s): "CKTOTAL", "CKMB", "CKMBINDEX", "TROPONINI" in the last 168 hours. BNP: Invalid input(s): "POCBNP" CBG: Recent Labs  Lab 06/14/22 1610  GLUCAP 99   D-Dimer No results for input(s): "DDIMER" in the last 72 hours. Hgb A1c No  results for input(s): "HGBA1C" in the last 72 hours. Lipid Profile No results for input(s): "CHOL", "HDL", "LDLCALC", "TRIG", "CHOLHDL", "LDLDIRECT" in the last 72 hours. Thyroid function studies No results for input(s): "TSH", "T4TOTAL", "T3FREE", "THYROIDAB" in the last 72 hours.  Invalid input(s): "FREET3" Anemia work up No results for input(s): "VITAMINB12", "FOLATE", "FERRITIN", "TIBC", "IRON", "RETICCTPCT" in the last 72 hours. Microbiology Recent Results (from the past 240 hour(s))  Resp Panel by RT-PCR (Flu A&B, Covid) Anterior Nasal Swab     Status: None   Collection Time: 06/14/22  5:45 PM   Specimen: Anterior Nasal Swab  Result Value Ref Range Status   SARS Coronavirus 2 by RT PCR NEGATIVE NEGATIVE Final    Comment: (NOTE) SARS-CoV-2 target nucleic acids are NOT DETECTED.  The SARS-CoV-2 RNA is generally detectable in upper respiratory specimens during the acute phase of infection. The lowest concentration of SARS-CoV-2 viral copies this assay can detect is 138 copies/mL. A negative result does not preclude SARS-Cov-2 infection and should not be used as the sole basis for treatment or other patient management decisions. A negative result may occur with  improper specimen collection/handling, submission of specimen other than nasopharyngeal swab, presence of viral mutation(s) within the areas targeted by this assay, and inadequate number of viral copies(<138 copies/mL). A negative result must be combined with clinical observations, patient history, and epidemiological information. The expected result is Negative.  Fact Sheet for Patients:  EntrepreneurPulse.com.au  Fact Sheet for Healthcare Providers:  IncredibleEmployment.be  This test is no t yet approved or cleared by the [place] FDA and  has been authorized for detection and/or diagnosis of SARS-CoV-2 by FDA under an Emergency Use Authorization (EUA). This EUA will remain   in effect (meaning this test can be used) for the duration of the COVID-19 declaration under Section 564(b)(1) of the Act, 21 U.S.C.section 360bbb-3(b)(1), unless the authorization is terminated  or  revoked sooner.       Influenza A by PCR NEGATIVE NEGATIVE Final   Influenza B by PCR NEGATIVE NEGATIVE Final    Comment: (NOTE) The Xpert Xpress SARS-CoV-2/FLU/RSV plus assay is intended as an aid in the diagnosis of influenza from Nasopharyngeal swab specimens and should not be used as a sole basis for treatment. Nasal washings and aspirates are unacceptable for Xpert Xpress SARS-CoV-2/FLU/RSV testing.  Fact Sheet for Patients: EntrepreneurPulse.com.au  Fact Sheet for Healthcare Providers: IncredibleEmployment.be  This test is not yet approved or cleared by the [place] FDA and has been authorized for detection and/or diagnosis of SARS-CoV-2 by FDA under an Emergency Use Authorization (EUA). This EUA will remain in effect (meaning this test can be used) for the duration of the COVID-19 declaration under Section 564(b)(1) of the Act, 21 U.S.C. section 360bbb-3(b)(1), unless the authorization is terminated or revoked.  Performed at [facility], [place] [address], [place], [place] [address]   MRSA Next Gen by PCR, Nasal     Status: None   Collection Time: 06/15/22  1:05 PM   Specimen: Nasal Mucosa; Nasal Swab  Result Value Ref Range Status   MRSA by PCR Next Gen NOT DETECTED NOT DETECTED Final    Comment: (NOTE) The GeneXpert MRSA Assay (FDA approved for NASAL specimens only), is one component of a comprehensive MRSA colonization surveillance program. It is not intended to diagnose MRSA infection nor to guide or monitor treatment for MRSA infections. Test performance is not FDA approved in patients less than 74 years old. Performed at [facility], [place] [address], [place], [place] [address]      Time coordinating discharge: 35 minutes  Signed: Marlowe Aschoff Medora Roorda  [facility] 06/17/2022, 2:44 PM

## 2022-06-18 ENCOUNTER — Telehealth: Payer: Self-pay

## 2022-06-18 DIAGNOSIS — J439 Emphysema, unspecified: Secondary | ICD-10-CM | POA: Diagnosis not present

## 2022-06-18 DIAGNOSIS — I471 Supraventricular tachycardia, unspecified: Secondary | ICD-10-CM | POA: Diagnosis not present

## 2022-06-18 DIAGNOSIS — Z7951 Long term (current) use of inhaled steroids: Secondary | ICD-10-CM | POA: Diagnosis not present

## 2022-06-18 DIAGNOSIS — A419 Sepsis, unspecified organism: Secondary | ICD-10-CM | POA: Diagnosis not present

## 2022-06-18 DIAGNOSIS — I10 Essential (primary) hypertension: Secondary | ICD-10-CM | POA: Diagnosis not present

## 2022-06-18 DIAGNOSIS — D869 Sarcoidosis, unspecified: Secondary | ICD-10-CM | POA: Diagnosis not present

## 2022-06-18 DIAGNOSIS — H5461 Unqualified visual loss, right eye, normal vision left eye: Secondary | ICD-10-CM | POA: Diagnosis not present

## 2022-06-18 DIAGNOSIS — J690 Pneumonitis due to inhalation of food and vomit: Secondary | ICD-10-CM | POA: Diagnosis not present

## 2022-06-18 DIAGNOSIS — E785 Hyperlipidemia, unspecified: Secondary | ICD-10-CM | POA: Diagnosis not present

## 2022-06-18 DIAGNOSIS — J8410 Pulmonary fibrosis, unspecified: Secondary | ICD-10-CM | POA: Diagnosis not present

## 2022-06-18 DIAGNOSIS — H269 Unspecified cataract: Secondary | ICD-10-CM | POA: Diagnosis not present

## 2022-06-18 DIAGNOSIS — J9611 Chronic respiratory failure with hypoxia: Secondary | ICD-10-CM | POA: Diagnosis not present

## 2022-06-18 DIAGNOSIS — M810 Age-related osteoporosis without current pathological fracture: Secondary | ICD-10-CM | POA: Diagnosis not present

## 2022-06-18 DIAGNOSIS — Z7952 Long term (current) use of systemic steroids: Secondary | ICD-10-CM | POA: Diagnosis not present

## 2022-06-18 DIAGNOSIS — G319 Degenerative disease of nervous system, unspecified: Secondary | ICD-10-CM | POA: Diagnosis not present

## 2022-06-18 DIAGNOSIS — Z9981 Dependence on supplemental oxygen: Secondary | ICD-10-CM | POA: Diagnosis not present

## 2022-06-18 DIAGNOSIS — E440 Moderate protein-calorie malnutrition: Secondary | ICD-10-CM | POA: Diagnosis not present

## 2022-06-18 DIAGNOSIS — Z79899 Other long term (current) drug therapy: Secondary | ICD-10-CM | POA: Diagnosis not present

## 2022-06-18 DIAGNOSIS — E875 Hyperkalemia: Secondary | ICD-10-CM | POA: Diagnosis not present

## 2022-06-18 DIAGNOSIS — M24451 Recurrent dislocation, right hip: Secondary | ICD-10-CM | POA: Diagnosis not present

## 2022-06-18 DIAGNOSIS — F32A Depression, unspecified: Secondary | ICD-10-CM | POA: Diagnosis not present

## 2022-06-18 DIAGNOSIS — E871 Hypo-osmolality and hyponatremia: Secondary | ICD-10-CM | POA: Diagnosis not present

## 2022-06-18 DIAGNOSIS — K219 Gastro-esophageal reflux disease without esophagitis: Secondary | ICD-10-CM | POA: Diagnosis not present

## 2022-06-18 DIAGNOSIS — Z87891 Personal history of nicotine dependence: Secondary | ICD-10-CM | POA: Diagnosis not present

## 2022-06-18 NOTE — Telephone Encounter (Signed)
Transition Care Management Follow-up Telephone Call Date of discharge and from where: [facility] 06-17-22 Dx: acute on chronic respiratory failure with hypercapnia COPD exac  How have you been since you were released from the hospital? Feeling ok  Any questions or concerns? No  Items Reviewed: Did the pt receive and understand the discharge instructions provided? Yes  Medications obtained and verified? Yes  Other? No  Any new allergies since your discharge? No  Dietary orders reviewed? Yes Do you have support at home? Yes   Home Care and Equipment/Supplies: Were home health services ordered? yes If so, what is the name of the agency? [facility]   Has the agency set up a time to come to the patient's home? no Were any new equipment or medical supplies ordered?  No What is the name of the medical supply agency? [facility] Were you able to get the supplies/equipment? not applicable Do you have any questions related to the use of the equipment or supplies? No  Functional Questionnaire: (I = Independent and D = Dependent) ADLs: I  Bathing/Dressing- I  Meal Prep- I  Eating- I  Maintaining continence- I  Transferring/Ambulation- I-WALKER  Managing Meds- WITH HELP OF DAUGHTER  Follow up appointments reviewed:  PCP Hospital f/u appt confirmed? Yes  Scheduled to see Dr Deniece Ree on 06-25-22 @ 130pm. [facility] f/u appt confirmed? Yes  Scheduled to see Dr Ander Slade on 07-14-22 @ 3pm. Are transportation arrangements needed? No  If their condition worsens, is the pt aware to call PCP or go to the Emergency Dept.? Yes Was the patient provided with contact information for the PCP's office or ED? Yes Was to pt encouraged to call back with questions or concerns? Yes   Juanda Crumble LPN [place] Direct Dial [phone]

## 2022-06-20 DIAGNOSIS — J439 Emphysema, unspecified: Secondary | ICD-10-CM | POA: Diagnosis not present

## 2022-06-20 DIAGNOSIS — J441 Chronic obstructive pulmonary disease with (acute) exacerbation: Secondary | ICD-10-CM | POA: Diagnosis not present

## 2022-06-20 DIAGNOSIS — J8410 Pulmonary fibrosis, unspecified: Secondary | ICD-10-CM | POA: Diagnosis not present

## 2022-06-20 DIAGNOSIS — J9622 Acute and chronic respiratory failure with hypercapnia: Secondary | ICD-10-CM | POA: Diagnosis not present

## 2022-06-20 DIAGNOSIS — K219 Gastro-esophageal reflux disease without esophagitis: Secondary | ICD-10-CM | POA: Diagnosis not present

## 2022-06-20 DIAGNOSIS — J9611 Chronic respiratory failure with hypoxia: Secondary | ICD-10-CM | POA: Diagnosis not present

## 2022-06-20 DIAGNOSIS — I10 Essential (primary) hypertension: Secondary | ICD-10-CM | POA: Diagnosis not present

## 2022-06-20 DIAGNOSIS — E440 Moderate protein-calorie malnutrition: Secondary | ICD-10-CM | POA: Diagnosis not present

## 2022-06-20 DIAGNOSIS — E785 Hyperlipidemia, unspecified: Secondary | ICD-10-CM | POA: Diagnosis not present

## 2022-06-20 DIAGNOSIS — H5462 Unqualified visual loss, left eye, normal vision right eye: Secondary | ICD-10-CM | POA: Diagnosis not present

## 2022-06-20 DIAGNOSIS — D860 Sarcoidosis of lung: Secondary | ICD-10-CM | POA: Diagnosis not present

## 2022-06-20 DIAGNOSIS — E871 Hypo-osmolality and hyponatremia: Secondary | ICD-10-CM | POA: Diagnosis not present

## 2022-06-20 DIAGNOSIS — G319 Degenerative disease of nervous system, unspecified: Secondary | ICD-10-CM | POA: Diagnosis not present

## 2022-06-20 DIAGNOSIS — Z7951 Long term (current) use of inhaled steroids: Secondary | ICD-10-CM | POA: Diagnosis not present

## 2022-06-20 DIAGNOSIS — G9341 Metabolic encephalopathy: Secondary | ICD-10-CM | POA: Diagnosis not present

## 2022-06-20 DIAGNOSIS — H269 Unspecified cataract: Secondary | ICD-10-CM | POA: Diagnosis not present

## 2022-06-20 DIAGNOSIS — J690 Pneumonitis due to inhalation of food and vomit: Secondary | ICD-10-CM | POA: Diagnosis not present

## 2022-06-20 DIAGNOSIS — E875 Hyperkalemia: Secondary | ICD-10-CM | POA: Diagnosis not present

## 2022-06-20 DIAGNOSIS — E559 Vitamin D deficiency, unspecified: Secondary | ICD-10-CM | POA: Diagnosis not present

## 2022-06-20 DIAGNOSIS — Z7952 Long term (current) use of systemic steroids: Secondary | ICD-10-CM | POA: Diagnosis not present

## 2022-06-20 DIAGNOSIS — E876 Hypokalemia: Secondary | ICD-10-CM | POA: Diagnosis not present

## 2022-06-20 DIAGNOSIS — F32A Depression, unspecified: Secondary | ICD-10-CM | POA: Diagnosis not present

## 2022-06-20 DIAGNOSIS — I471 Supraventricular tachycardia, unspecified: Secondary | ICD-10-CM | POA: Diagnosis not present

## 2022-06-20 DIAGNOSIS — M810 Age-related osteoporosis without current pathological fracture: Secondary | ICD-10-CM | POA: Diagnosis not present

## 2022-06-21 DIAGNOSIS — M810 Age-related osteoporosis without current pathological fracture: Secondary | ICD-10-CM | POA: Diagnosis not present

## 2022-06-21 DIAGNOSIS — G319 Degenerative disease of nervous system, unspecified: Secondary | ICD-10-CM | POA: Diagnosis not present

## 2022-06-21 DIAGNOSIS — K219 Gastro-esophageal reflux disease without esophagitis: Secondary | ICD-10-CM | POA: Diagnosis not present

## 2022-06-21 DIAGNOSIS — I10 Essential (primary) hypertension: Secondary | ICD-10-CM | POA: Diagnosis not present

## 2022-06-21 DIAGNOSIS — J9622 Acute and chronic respiratory failure with hypercapnia: Secondary | ICD-10-CM | POA: Diagnosis not present

## 2022-06-21 DIAGNOSIS — I471 Supraventricular tachycardia, unspecified: Secondary | ICD-10-CM | POA: Diagnosis not present

## 2022-06-21 DIAGNOSIS — E785 Hyperlipidemia, unspecified: Secondary | ICD-10-CM | POA: Diagnosis not present

## 2022-06-21 DIAGNOSIS — G9341 Metabolic encephalopathy: Secondary | ICD-10-CM | POA: Diagnosis not present

## 2022-06-21 DIAGNOSIS — E559 Vitamin D deficiency, unspecified: Secondary | ICD-10-CM | POA: Diagnosis not present

## 2022-06-21 DIAGNOSIS — J439 Emphysema, unspecified: Secondary | ICD-10-CM | POA: Diagnosis not present

## 2022-06-21 DIAGNOSIS — H269 Unspecified cataract: Secondary | ICD-10-CM | POA: Diagnosis not present

## 2022-06-21 DIAGNOSIS — J8410 Pulmonary fibrosis, unspecified: Secondary | ICD-10-CM | POA: Diagnosis not present

## 2022-06-21 DIAGNOSIS — Z7951 Long term (current) use of inhaled steroids: Secondary | ICD-10-CM | POA: Diagnosis not present

## 2022-06-21 DIAGNOSIS — D860 Sarcoidosis of lung: Secondary | ICD-10-CM | POA: Diagnosis not present

## 2022-06-21 DIAGNOSIS — Z7952 Long term (current) use of systemic steroids: Secondary | ICD-10-CM | POA: Diagnosis not present

## 2022-06-21 DIAGNOSIS — E876 Hypokalemia: Secondary | ICD-10-CM | POA: Diagnosis not present

## 2022-06-21 DIAGNOSIS — H5462 Unqualified visual loss, left eye, normal vision right eye: Secondary | ICD-10-CM | POA: Diagnosis not present

## 2022-06-21 DIAGNOSIS — E440 Moderate protein-calorie malnutrition: Secondary | ICD-10-CM | POA: Diagnosis not present

## 2022-06-21 DIAGNOSIS — E875 Hyperkalemia: Secondary | ICD-10-CM | POA: Diagnosis not present

## 2022-06-21 DIAGNOSIS — J9611 Chronic respiratory failure with hypoxia: Secondary | ICD-10-CM | POA: Diagnosis not present

## 2022-06-21 DIAGNOSIS — J441 Chronic obstructive pulmonary disease with (acute) exacerbation: Secondary | ICD-10-CM | POA: Diagnosis not present

## 2022-06-21 DIAGNOSIS — E871 Hypo-osmolality and hyponatremia: Secondary | ICD-10-CM | POA: Diagnosis not present

## 2022-06-21 DIAGNOSIS — J690 Pneumonitis due to inhalation of food and vomit: Secondary | ICD-10-CM | POA: Diagnosis not present

## 2022-06-21 DIAGNOSIS — F32A Depression, unspecified: Secondary | ICD-10-CM | POA: Diagnosis not present

## 2022-06-25 ENCOUNTER — Telehealth: Payer: Self-pay | Admitting: Internal Medicine

## 2022-06-25 ENCOUNTER — Inpatient Hospital Stay: Payer: Medicare Other | Admitting: Internal Medicine

## 2022-06-25 DIAGNOSIS — I10 Essential (primary) hypertension: Secondary | ICD-10-CM | POA: Diagnosis not present

## 2022-06-25 DIAGNOSIS — H269 Unspecified cataract: Secondary | ICD-10-CM | POA: Diagnosis not present

## 2022-06-25 DIAGNOSIS — E876 Hypokalemia: Secondary | ICD-10-CM | POA: Diagnosis not present

## 2022-06-25 DIAGNOSIS — F32A Depression, unspecified: Secondary | ICD-10-CM | POA: Diagnosis not present

## 2022-06-25 DIAGNOSIS — I471 Supraventricular tachycardia, unspecified: Secondary | ICD-10-CM | POA: Diagnosis not present

## 2022-06-25 DIAGNOSIS — J439 Emphysema, unspecified: Secondary | ICD-10-CM | POA: Diagnosis not present

## 2022-06-25 DIAGNOSIS — J9622 Acute and chronic respiratory failure with hypercapnia: Secondary | ICD-10-CM | POA: Diagnosis not present

## 2022-06-25 DIAGNOSIS — J690 Pneumonitis due to inhalation of food and vomit: Secondary | ICD-10-CM | POA: Diagnosis not present

## 2022-06-25 DIAGNOSIS — J441 Chronic obstructive pulmonary disease with (acute) exacerbation: Secondary | ICD-10-CM | POA: Diagnosis not present

## 2022-06-25 DIAGNOSIS — G319 Degenerative disease of nervous system, unspecified: Secondary | ICD-10-CM | POA: Diagnosis not present

## 2022-06-25 DIAGNOSIS — E559 Vitamin D deficiency, unspecified: Secondary | ICD-10-CM | POA: Diagnosis not present

## 2022-06-25 DIAGNOSIS — Z7951 Long term (current) use of inhaled steroids: Secondary | ICD-10-CM | POA: Diagnosis not present

## 2022-06-25 DIAGNOSIS — E785 Hyperlipidemia, unspecified: Secondary | ICD-10-CM | POA: Diagnosis not present

## 2022-06-25 DIAGNOSIS — D860 Sarcoidosis of lung: Secondary | ICD-10-CM | POA: Diagnosis not present

## 2022-06-25 DIAGNOSIS — E875 Hyperkalemia: Secondary | ICD-10-CM | POA: Diagnosis not present

## 2022-06-25 DIAGNOSIS — M810 Age-related osteoporosis without current pathological fracture: Secondary | ICD-10-CM | POA: Diagnosis not present

## 2022-06-25 DIAGNOSIS — H5462 Unqualified visual loss, left eye, normal vision right eye: Secondary | ICD-10-CM | POA: Diagnosis not present

## 2022-06-25 DIAGNOSIS — K219 Gastro-esophageal reflux disease without esophagitis: Secondary | ICD-10-CM | POA: Diagnosis not present

## 2022-06-25 DIAGNOSIS — E440 Moderate protein-calorie malnutrition: Secondary | ICD-10-CM | POA: Diagnosis not present

## 2022-06-25 DIAGNOSIS — Z7952 Long term (current) use of systemic steroids: Secondary | ICD-10-CM | POA: Diagnosis not present

## 2022-06-25 DIAGNOSIS — J9611 Chronic respiratory failure with hypoxia: Secondary | ICD-10-CM | POA: Diagnosis not present

## 2022-06-25 DIAGNOSIS — J8410 Pulmonary fibrosis, unspecified: Secondary | ICD-10-CM | POA: Diagnosis not present

## 2022-06-25 DIAGNOSIS — E871 Hypo-osmolality and hyponatremia: Secondary | ICD-10-CM | POA: Diagnosis not present

## 2022-06-25 DIAGNOSIS — G9341 Metabolic encephalopathy: Secondary | ICD-10-CM | POA: Diagnosis not present

## 2022-06-25 NOTE — Telephone Encounter (Signed)
Pt daughter [name] called and stated pt was prescribed prednisone when she was dismissed from the hospital however the hospital never called the Rx in. Pt daughter was asking if Dr. Lilian Coma send one in since the at home nurses were saying that she would do better with her breathing once she starts taking that med.  Pt Daughter would like a call to see if the Rx can be filled. [phone]  Please advise.

## 2022-06-25 NOTE — Telephone Encounter (Signed)
Spoke with [name] at [facility] and the Rx is ready.  Daughter is aware.

## 2022-06-27 DIAGNOSIS — G319 Degenerative disease of nervous system, unspecified: Secondary | ICD-10-CM | POA: Diagnosis not present

## 2022-06-27 DIAGNOSIS — D860 Sarcoidosis of lung: Secondary | ICD-10-CM | POA: Diagnosis not present

## 2022-06-27 DIAGNOSIS — E559 Vitamin D deficiency, unspecified: Secondary | ICD-10-CM | POA: Diagnosis not present

## 2022-06-27 DIAGNOSIS — H269 Unspecified cataract: Secondary | ICD-10-CM | POA: Diagnosis not present

## 2022-06-27 DIAGNOSIS — K219 Gastro-esophageal reflux disease without esophagitis: Secondary | ICD-10-CM | POA: Diagnosis not present

## 2022-06-27 DIAGNOSIS — J690 Pneumonitis due to inhalation of food and vomit: Secondary | ICD-10-CM | POA: Diagnosis not present

## 2022-06-27 DIAGNOSIS — J439 Emphysema, unspecified: Secondary | ICD-10-CM | POA: Diagnosis not present

## 2022-06-27 DIAGNOSIS — E876 Hypokalemia: Secondary | ICD-10-CM | POA: Diagnosis not present

## 2022-06-27 DIAGNOSIS — E440 Moderate protein-calorie malnutrition: Secondary | ICD-10-CM | POA: Diagnosis not present

## 2022-06-27 DIAGNOSIS — J9611 Chronic respiratory failure with hypoxia: Secondary | ICD-10-CM | POA: Diagnosis not present

## 2022-06-27 DIAGNOSIS — E871 Hypo-osmolality and hyponatremia: Secondary | ICD-10-CM | POA: Diagnosis not present

## 2022-06-27 DIAGNOSIS — Z7951 Long term (current) use of inhaled steroids: Secondary | ICD-10-CM | POA: Diagnosis not present

## 2022-06-27 DIAGNOSIS — Z7952 Long term (current) use of systemic steroids: Secondary | ICD-10-CM | POA: Diagnosis not present

## 2022-06-27 DIAGNOSIS — J9622 Acute and chronic respiratory failure with hypercapnia: Secondary | ICD-10-CM | POA: Diagnosis not present

## 2022-06-27 DIAGNOSIS — F32A Depression, unspecified: Secondary | ICD-10-CM | POA: Diagnosis not present

## 2022-06-27 DIAGNOSIS — J441 Chronic obstructive pulmonary disease with (acute) exacerbation: Secondary | ICD-10-CM | POA: Diagnosis not present

## 2022-06-27 DIAGNOSIS — E875 Hyperkalemia: Secondary | ICD-10-CM | POA: Diagnosis not present

## 2022-06-27 DIAGNOSIS — J8410 Pulmonary fibrosis, unspecified: Secondary | ICD-10-CM | POA: Diagnosis not present

## 2022-06-27 DIAGNOSIS — I10 Essential (primary) hypertension: Secondary | ICD-10-CM | POA: Diagnosis not present

## 2022-06-27 DIAGNOSIS — M810 Age-related osteoporosis without current pathological fracture: Secondary | ICD-10-CM | POA: Diagnosis not present

## 2022-06-27 DIAGNOSIS — E785 Hyperlipidemia, unspecified: Secondary | ICD-10-CM | POA: Diagnosis not present

## 2022-06-27 DIAGNOSIS — I471 Supraventricular tachycardia, unspecified: Secondary | ICD-10-CM | POA: Diagnosis not present

## 2022-06-27 DIAGNOSIS — G9341 Metabolic encephalopathy: Secondary | ICD-10-CM | POA: Diagnosis not present

## 2022-06-27 DIAGNOSIS — H5462 Unqualified visual loss, left eye, normal vision right eye: Secondary | ICD-10-CM | POA: Diagnosis not present

## 2022-06-30 DIAGNOSIS — M810 Age-related osteoporosis without current pathological fracture: Secondary | ICD-10-CM | POA: Diagnosis not present

## 2022-06-30 DIAGNOSIS — Z7951 Long term (current) use of inhaled steroids: Secondary | ICD-10-CM | POA: Diagnosis not present

## 2022-06-30 DIAGNOSIS — G319 Degenerative disease of nervous system, unspecified: Secondary | ICD-10-CM | POA: Diagnosis not present

## 2022-06-30 DIAGNOSIS — F32A Depression, unspecified: Secondary | ICD-10-CM | POA: Diagnosis not present

## 2022-06-30 DIAGNOSIS — I471 Supraventricular tachycardia, unspecified: Secondary | ICD-10-CM | POA: Diagnosis not present

## 2022-06-30 DIAGNOSIS — E875 Hyperkalemia: Secondary | ICD-10-CM | POA: Diagnosis not present

## 2022-06-30 DIAGNOSIS — J441 Chronic obstructive pulmonary disease with (acute) exacerbation: Secondary | ICD-10-CM | POA: Diagnosis not present

## 2022-06-30 DIAGNOSIS — E440 Moderate protein-calorie malnutrition: Secondary | ICD-10-CM | POA: Diagnosis not present

## 2022-06-30 DIAGNOSIS — J439 Emphysema, unspecified: Secondary | ICD-10-CM | POA: Diagnosis not present

## 2022-06-30 DIAGNOSIS — J690 Pneumonitis due to inhalation of food and vomit: Secondary | ICD-10-CM | POA: Diagnosis not present

## 2022-06-30 DIAGNOSIS — J8410 Pulmonary fibrosis, unspecified: Secondary | ICD-10-CM | POA: Diagnosis not present

## 2022-06-30 DIAGNOSIS — K219 Gastro-esophageal reflux disease without esophagitis: Secondary | ICD-10-CM | POA: Diagnosis not present

## 2022-06-30 DIAGNOSIS — H269 Unspecified cataract: Secondary | ICD-10-CM | POA: Diagnosis not present

## 2022-06-30 DIAGNOSIS — J9611 Chronic respiratory failure with hypoxia: Secondary | ICD-10-CM | POA: Diagnosis not present

## 2022-06-30 DIAGNOSIS — J9622 Acute and chronic respiratory failure with hypercapnia: Secondary | ICD-10-CM | POA: Diagnosis not present

## 2022-06-30 DIAGNOSIS — E785 Hyperlipidemia, unspecified: Secondary | ICD-10-CM | POA: Diagnosis not present

## 2022-06-30 DIAGNOSIS — I10 Essential (primary) hypertension: Secondary | ICD-10-CM | POA: Diagnosis not present

## 2022-06-30 DIAGNOSIS — H5462 Unqualified visual loss, left eye, normal vision right eye: Secondary | ICD-10-CM | POA: Diagnosis not present

## 2022-06-30 DIAGNOSIS — G9341 Metabolic encephalopathy: Secondary | ICD-10-CM | POA: Diagnosis not present

## 2022-06-30 DIAGNOSIS — E559 Vitamin D deficiency, unspecified: Secondary | ICD-10-CM | POA: Diagnosis not present

## 2022-06-30 DIAGNOSIS — Z7952 Long term (current) use of systemic steroids: Secondary | ICD-10-CM | POA: Diagnosis not present

## 2022-06-30 DIAGNOSIS — E876 Hypokalemia: Secondary | ICD-10-CM | POA: Diagnosis not present

## 2022-06-30 DIAGNOSIS — E871 Hypo-osmolality and hyponatremia: Secondary | ICD-10-CM | POA: Diagnosis not present

## 2022-06-30 DIAGNOSIS — D860 Sarcoidosis of lung: Secondary | ICD-10-CM | POA: Diagnosis not present

## 2022-07-01 ENCOUNTER — Encounter: Payer: Self-pay | Admitting: Internal Medicine

## 2022-07-01 ENCOUNTER — Ambulatory Visit (INDEPENDENT_AMBULATORY_CARE_PROVIDER_SITE_OTHER): Payer: Medicare Other | Admitting: Internal Medicine

## 2022-07-01 VITALS — BP 110/72 | HR 94 | Temp 98.3°F | Wt 105.0 lb

## 2022-07-01 DIAGNOSIS — J9622 Acute and chronic respiratory failure with hypercapnia: Secondary | ICD-10-CM | POA: Diagnosis not present

## 2022-07-01 DIAGNOSIS — J441 Chronic obstructive pulmonary disease with (acute) exacerbation: Secondary | ICD-10-CM | POA: Diagnosis not present

## 2022-07-01 DIAGNOSIS — M79605 Pain in left leg: Secondary | ICD-10-CM

## 2022-07-01 DIAGNOSIS — R4182 Altered mental status, unspecified: Secondary | ICD-10-CM

## 2022-07-01 DIAGNOSIS — Z09 Encounter for follow-up examination after completed treatment for conditions other than malignant neoplasm: Secondary | ICD-10-CM

## 2022-07-01 DIAGNOSIS — J9621 Acute and chronic respiratory failure with hypoxia: Secondary | ICD-10-CM | POA: Diagnosis not present

## 2022-07-01 MED ORDER — MELOXICAM 7.5 MG PO TABS: 7.5000 mg | ORAL_TABLET | Freq: Every day | ORAL | 0 refills | Status: DC

## 2022-07-01 NOTE — Progress Notes (Signed)
Hospital follow-up visit     CC/Reason for Visit: Hospitalization follow-up  HPI: [name] is a 76 y.o. female who is coming in today for the above mentioned reasons, specifically transitional care services face-to-face visit.    Dates hospitalized: 06/14/2022-06/17/2022 Days since discharge from hospital: 14 Patient was discharged from the hospital to: Home Reason for admission to hospital: Acute on chronic respiratory failure Date of interactive phone contact with patient and/or caregiver: 06/18/2022  I have reviewed in detail patient's discharge summary plus pertinent specific notes, labs, and images from the hospitalization.  Yes  Patient was admitted to the hospital for shortness of breath, tachycardia and altered mental status.  She was found to be hypoxic and hypercarbic, she did require BiPAP transiently.  She was discharged home on prednisone taper and physical therapy was arranged.  She is taking medications as prescribed.  She has a follow-up with her pulmonologist scheduled for December 4.  She has been complaining of some longstanding left thigh and calf pain.  She is known to have hip osteoarthritis.  Medication reconciliation was done today and patient is taking meds as recommended by discharging hospitalist/specialist.  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   Depression 06/08/2007   Essential hypertension, benign 06/08/2007   Family history of breast cancer    GERD 01/18/2009   Osteoporosis 12/31/2012   Left femur had T score -2.5 on DEXA scan 02/26/2009.    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   Vitamin D deficiency      Past Surgical History:  Procedure Laterality Date   CATARACT EXTRACTION Right    COLONOSCOPY  2002   HIP ARTHROPLASTY Right    HIP CLOSED REDUCTION Right 04/08/2021   Procedure: CLOSED REDUCTION HIP;  Surgeon: Mcarthur Rossetti, MD;  Location: [facility];  Service: Orthopedics;  Laterality: Right;   HIP CLOSED REDUCTION Right 09/15/2021   Procedure: CLOSED REDUCTION HIP;  Surgeon: Melina Schools, MD;  Location: [facility];  Service: Orthopedics;  Laterality: Right;   TUBAL LIGATION      Social History:  reports that she quit smoking about 12 years ago. Her smoking use included cigarettes. She has a 7.50 pack-year smoking history. She has never used smokeless tobacco. She reports that she does not currently use alcohol. She reports that she does not use drugs.  Allergies: Allergies  Allergen Reactions   Ace Inhibitors Swelling   Lisinopril Swelling    REACTION: Angioedema   Valsartan Swelling    REACTION: angioedema.  Pt should not get ACEI or ARBs of any kind due to angioedema.   Aspirin Other (See Comments)    REACTION: GI ulcer.   Penicillins     Childhood allergy Has patient had a PCN reaction causing immediate rash, facial/tongue/throat swelling, SOB or lightheadedness with hypotension: Unknown Has patient had a PCN reaction causing severe rash involving mucus membranes or skin necrosis: Unknown Has patient had a PCN reaction that required hospitalization: Unknown Has patient had a PCN reaction occurring within the last 10 years: Unknown If all of the above answers are "NO", then may proceed with  Cephalosporin use.     Family History:  Family History  Problem Relation Age of Onset   Heart disease Mother    Breast cancer Mother 66   Stroke Father    Heart disease Sister    Asthma Daughter    Cancer Other    Diabetes Other    Hypertension Other    COPD Brother        "9/11 survivor"   COPD Sister        "9/11 survivor"   Breast cancer Maternal Grandmother         dx in her 34s   Breast cancer Other        MGMs mother died in her 37s   Colon cancer Neg Hx      Current Outpatient Medications:    acetaminophen (TYLENOL) 500 MG tablet, Take 1,000 mg by mouth every 6 (six) hours as needed for moderate pain., Disp: , Rfl:    albuterol (PROVENTIL) (2.5 MG/3ML) 0.083% nebulizer solution, Inhale 3 mLs (2.5 mg total) by nebulization every 6 (six) hours as needed for wheezing., Disp: 180 mL, Rfl: 2   albuterol (VENTOLIN HFA) 108 (90 Base) MCG/ACT inhaler, INHALE TWO PUFFS EVERY 6 HOURS AS NEEDED FOR WHEEZING (Patient taking differently: Inhale 2 puffs into the lungs every 6 (six) hours as needed for wheezing. INHALE TWO PUFFS EVERY 6 HOURS AS NEEDED FOR WHEEZING), Disp: 8.5 g, Rfl: 19   amLODipine (NORVASC) 5 MG tablet, Take 1 tablet (5 mg total) by mouth daily., Disp: 90 tablet, Rfl: 1   famotidine (PEPCID) 20 MG tablet, TAKE ONE TABLET BY MOUTH TWICE DAILY (Patient taking differently: Take 20 mg by mouth 2 (two) times daily.), Disp: 60 tablet, Rfl: 5   fluticasone-salmeterol (WIXELA INHUB) 500-50 MCG/ACT AEPB, Inhale 1 puff into the lungs in the morning and at bedtime., Disp: 60 each, Rfl: 2   gabapentin (NEURONTIN) 100 MG capsule, TAKE ONE CAPSULE BY MOUTH THREE TIMES DAILY (Patient taking differently: Take 100 mg by mouth 3 (three) times daily.), Disp: 90 capsule, Rfl: 1   hydrochlorothiazide (HYDRODIURIL) 25 MG tablet, TAKE ONE-HALF TO 1 TABLET BY MOUTH DAILY AS NEEDED FOR SWELLING (Patient taking differently: Take 12.5 mg by mouth daily as needed (for swelling).), Disp: 45 tablet, Rfl: 1   ipratropium (ATROVENT) 0.02 % nebulizer solution, Take 2.5 mLs (0.5 mg total) by nebulization 2 (two) times daily., Disp: 75 mL, Rfl: 12   loratadine (CLARITIN) 10 MG tablet, Take 1 tablet (10 mg total) by mouth daily., Disp: 90 tablet, Rfl: 1   melatonin 5 MG TABS, Take 2 tablets (10 mg total) by mouth at bedtime. (Patient taking differently: Take 5 mg by mouth at bedtime.),  Disp: 60 tablet, Rfl: 5   meloxicam (MOBIC) 7.5 MG tablet, Take 1 tablet (7.5 mg total) by mouth daily., Disp: 30 tablet, Rfl: 0   metoprolol succinate (TOPROL-XL) 50 MG 24 hr tablet, TAKE ONE TABLET BY MOUTH DAILY WITH OR IMMEDIATELY FOLLOWING A MEAL (Patient taking differently: Take 50 mg by mouth daily. TAKE ONE TABLET BY MOUTH DAILY WITH OR IMMEDIATELY FOLLOWING A MEAL), Disp: 30 tablet, Rfl: 5   mirtazapine (REMERON) 15 MG tablet, TAKE ONE TABLET BY MOUTH AT BEDTIME (Patient taking differently: Take 15 mg by mouth at bedtime.), Disp: 30 tablet, Rfl: 11   pantoprazole (PROTONIX) 40 MG tablet, TAKE ONE TABLET BY MOUTH DAILY, Disp: 90 tablet, Rfl: 0   pravastatin (PRAVACHOL) 40 MG tablet, Take 1 tablet (40 mg total) by mouth every  evening., Disp: 90 tablet, Rfl: 1   predniSONE (DELTASONE) 10 MG tablet, Take 4 tablets daily X 2 days, then, Take 3 tablets daily X 2 days, then, Take 2 tablets daily X 2 days, then, Take 1 tablets daily X 1 day., Disp: 19 tablet, Rfl: 0  Review of Systems:  Constitutional: Denies fever, chills, diaphoresis, appetite change. HEENT: Denies photophobia, eye pain, redness, hearing loss, ear pain, congestion, sore throat, rhinorrhea, sneezing, mouth sores, trouble swallowing, neck pain, neck stiffness and tinnitus.   Respiratory: Denies  chest tightness,  and wheezing.   Cardiovascular: Denies chest pain, palpitations and leg swelling.  Gastrointestinal: Denies nausea, vomiting, abdominal pain, diarrhea, constipation, blood in stool and abdominal distention.  Genitourinary: Denies dysuria, urgency, frequency, hematuria, flank pain and difficulty urinating.  Endocrine: Denies: hot or cold intolerance, sweats, changes in hair or nails, polyuria, polydipsia. Musculoskeletal: Denies myalgias, back pain, joint swelling, arthralgias and gait problem.  Skin: Denies pallor, rash and wound.  Neurological: Denies dizziness, seizures, syncope, weakness, light-headedness, numbness and  headaches.  Hematological: Denies adenopathy. Easy bruising, personal or family bleeding history  Psychiatric/Behavioral: Denies suicidal ideation, mood changes, confusion, nervousness, sleep disturbance and agitation    Physical Exam: Vitals:   07/01/22 1509 07/01/22 1518  BP: 110/72   Pulse: 94   Temp: 98.3 F (36.8 C)   TempSrc: Oral   SpO2: (!) 86% 97%  Weight: 105 lb (47.6 kg)     Body mass index is 18.6 kg/m.   Constitutional: NAD, calm, comfortable Eyes: PERRL, lids and conjunctivae normal ENMT: Mucous membranes are moist.  Respiratory: Poor air movement, no wheezes or crackles.  Normal respiratory effort. No accessory muscle use.  Cardiovascular: Tachycardic but regular rhythm, no murmurs / rubs / gallops. No extremity edema.  Psychiatric: Normal judgment and insight. Alert and oriented x 3. Normal mood.    Impression and Plan:  Hospital discharge follow-up  Acute on chronic respiratory failure with hypercapnia (HCC)  Acute on chronic respiratory failure with hypoxia (HCC)  COPD with acute exacerbation (HCC)  Altered mental status, unspecified altered mental status type  Left leg pain - Plan: meloxicam (MOBIC) 7.5 MG tablet  [facility] charts have been reviewed in detail. -I have confirmed that she has follow-up with her pulmonologist scheduled for December 4.  I wonder whether transitioning her to hospice/palliative care may be beneficial. [facility] physician also wondered whether daily prednisone use might be effective, I think given her daily ongoing shortness of breath this may be useful but will defer to her pulmonologist. -Her oxygen levels were around 85 to 86% in office today on her baseline 3 L.  I have discussed with the patient to increase her oxygen as needed to keep sats around 88% or higher. -For her left thigh and calf pain I will prescribe some meloxicam to use sporadically.  Medical decision making of moderate complexity was utilized  today.      Lelon Frohlich, MD [place] [facility]ong

## 2022-07-02 ENCOUNTER — Telehealth: Payer: Self-pay | Admitting: Internal Medicine

## 2022-07-02 DIAGNOSIS — F32A Depression, unspecified: Secondary | ICD-10-CM | POA: Diagnosis not present

## 2022-07-02 DIAGNOSIS — J690 Pneumonitis due to inhalation of food and vomit: Secondary | ICD-10-CM | POA: Diagnosis not present

## 2022-07-02 DIAGNOSIS — Z7952 Long term (current) use of systemic steroids: Secondary | ICD-10-CM | POA: Diagnosis not present

## 2022-07-02 DIAGNOSIS — J9622 Acute and chronic respiratory failure with hypercapnia: Secondary | ICD-10-CM | POA: Diagnosis not present

## 2022-07-02 DIAGNOSIS — J439 Emphysema, unspecified: Secondary | ICD-10-CM | POA: Diagnosis not present

## 2022-07-02 DIAGNOSIS — H5462 Unqualified visual loss, left eye, normal vision right eye: Secondary | ICD-10-CM | POA: Diagnosis not present

## 2022-07-02 DIAGNOSIS — I471 Supraventricular tachycardia, unspecified: Secondary | ICD-10-CM | POA: Diagnosis not present

## 2022-07-02 DIAGNOSIS — E559 Vitamin D deficiency, unspecified: Secondary | ICD-10-CM | POA: Diagnosis not present

## 2022-07-02 DIAGNOSIS — H269 Unspecified cataract: Secondary | ICD-10-CM | POA: Diagnosis not present

## 2022-07-02 DIAGNOSIS — Z7951 Long term (current) use of inhaled steroids: Secondary | ICD-10-CM | POA: Diagnosis not present

## 2022-07-02 DIAGNOSIS — M810 Age-related osteoporosis without current pathological fracture: Secondary | ICD-10-CM | POA: Diagnosis not present

## 2022-07-02 DIAGNOSIS — E875 Hyperkalemia: Secondary | ICD-10-CM | POA: Diagnosis not present

## 2022-07-02 DIAGNOSIS — G319 Degenerative disease of nervous system, unspecified: Secondary | ICD-10-CM | POA: Diagnosis not present

## 2022-07-02 DIAGNOSIS — J441 Chronic obstructive pulmonary disease with (acute) exacerbation: Secondary | ICD-10-CM | POA: Diagnosis not present

## 2022-07-02 DIAGNOSIS — I10 Essential (primary) hypertension: Secondary | ICD-10-CM | POA: Diagnosis not present

## 2022-07-02 DIAGNOSIS — E871 Hypo-osmolality and hyponatremia: Secondary | ICD-10-CM | POA: Diagnosis not present

## 2022-07-02 DIAGNOSIS — E785 Hyperlipidemia, unspecified: Secondary | ICD-10-CM | POA: Diagnosis not present

## 2022-07-02 DIAGNOSIS — K219 Gastro-esophageal reflux disease without esophagitis: Secondary | ICD-10-CM | POA: Diagnosis not present

## 2022-07-02 DIAGNOSIS — E876 Hypokalemia: Secondary | ICD-10-CM | POA: Diagnosis not present

## 2022-07-02 DIAGNOSIS — J8410 Pulmonary fibrosis, unspecified: Secondary | ICD-10-CM | POA: Diagnosis not present

## 2022-07-02 DIAGNOSIS — G9341 Metabolic encephalopathy: Secondary | ICD-10-CM | POA: Diagnosis not present

## 2022-07-02 DIAGNOSIS — E440 Moderate protein-calorie malnutrition: Secondary | ICD-10-CM | POA: Diagnosis not present

## 2022-07-02 DIAGNOSIS — M79605 Pain in left leg: Secondary | ICD-10-CM

## 2022-07-02 DIAGNOSIS — D860 Sarcoidosis of lung: Secondary | ICD-10-CM | POA: Diagnosis not present

## 2022-07-02 DIAGNOSIS — J9611 Chronic respiratory failure with hypoxia: Secondary | ICD-10-CM | POA: Diagnosis not present

## 2022-07-02 MED ORDER — MELOXICAM 7.5 MG PO TABS: 7.5000 mg | ORAL_TABLET | Freq: Every day | ORAL | 0 refills | Status: DC

## 2022-07-02 NOTE — Telephone Encounter (Signed)
Spoke with pharmacist who said the refill was never received at [facility]. Rx sent

## 2022-07-02 NOTE — Telephone Encounter (Signed)
Rx also called in.  Daughter is aware

## 2022-07-02 NOTE — Telephone Encounter (Signed)
Pt was seen yesterday. Pt's daughter called to say the pharmacy is saying they have not received the Rx for the meloxicam (MOBIC) 7.5 MG tablet   Daughter states Pt was up all night crying in pain.  [facility] [place], [address] [place] Phone: [phone]  Fax: [phone]

## 2022-07-03 ENCOUNTER — Other Ambulatory Visit: Payer: Self-pay

## 2022-07-03 ENCOUNTER — Encounter (HOSPITAL_COMMUNITY): Payer: Self-pay

## 2022-07-03 ENCOUNTER — Emergency Department (HOSPITAL_COMMUNITY): Payer: Medicare Other

## 2022-07-03 ENCOUNTER — Emergency Department (HOSPITAL_COMMUNITY)
Admission: EM | Admit: 2022-07-03 | Discharge: 2022-07-03 | Disposition: A | Discharge status: Home / Self Care | Payer: Medicare Other | Source: Home / Self Care | Attending: Emergency Medicine | Admitting: Emergency Medicine

## 2022-07-03 DIAGNOSIS — J439 Emphysema, unspecified: Secondary | ICD-10-CM | POA: Diagnosis not present

## 2022-07-03 DIAGNOSIS — J9611 Chronic respiratory failure with hypoxia: Secondary | ICD-10-CM | POA: Diagnosis not present

## 2022-07-03 DIAGNOSIS — W06XXXA Fall from bed, initial encounter: Secondary | ICD-10-CM | POA: Insufficient documentation

## 2022-07-03 DIAGNOSIS — J449 Chronic obstructive pulmonary disease, unspecified: Secondary | ICD-10-CM | POA: Insufficient documentation

## 2022-07-03 DIAGNOSIS — W19XXXA Unspecified fall, initial encounter: Secondary | ICD-10-CM

## 2022-07-03 DIAGNOSIS — R0602 Shortness of breath: Secondary | ICD-10-CM | POA: Diagnosis not present

## 2022-07-03 DIAGNOSIS — I110 Hypertensive heart disease with heart failure: Secondary | ICD-10-CM | POA: Diagnosis not present

## 2022-07-03 DIAGNOSIS — A419 Sepsis, unspecified organism: Secondary | ICD-10-CM | POA: Diagnosis not present

## 2022-07-03 DIAGNOSIS — S42022A Displaced fracture of shaft of left clavicle, initial encounter for closed fracture: Secondary | ICD-10-CM | POA: Insufficient documentation

## 2022-07-03 DIAGNOSIS — I2723 Pulmonary hypertension due to lung diseases and hypoxia: Secondary | ICD-10-CM | POA: Diagnosis not present

## 2022-07-03 DIAGNOSIS — S42009A Fracture of unspecified part of unspecified clavicle, initial encounter for closed fracture: Secondary | ICD-10-CM | POA: Diagnosis not present

## 2022-07-03 DIAGNOSIS — Z20822 Contact with and (suspected) exposure to covid-19: Secondary | ICD-10-CM | POA: Diagnosis not present

## 2022-07-03 DIAGNOSIS — M542 Cervicalgia: Secondary | ICD-10-CM | POA: Diagnosis not present

## 2022-07-03 DIAGNOSIS — Z681 Body mass index (BMI) 19 or less, adult: Secondary | ICD-10-CM | POA: Diagnosis not present

## 2022-07-03 DIAGNOSIS — R0689 Other abnormalities of breathing: Secondary | ICD-10-CM | POA: Diagnosis not present

## 2022-07-03 DIAGNOSIS — K219 Gastro-esophageal reflux disease without esophagitis: Secondary | ICD-10-CM | POA: Diagnosis not present

## 2022-07-03 DIAGNOSIS — D862 Sarcoidosis of lung with sarcoidosis of lymph nodes: Secondary | ICD-10-CM | POA: Diagnosis not present

## 2022-07-03 DIAGNOSIS — J4489 Other specified chronic obstructive pulmonary disease: Secondary | ICD-10-CM | POA: Diagnosis not present

## 2022-07-03 DIAGNOSIS — J9622 Acute and chronic respiratory failure with hypercapnia: Secondary | ICD-10-CM | POA: Diagnosis not present

## 2022-07-03 DIAGNOSIS — Z043 Encounter for examination and observation following other accident: Secondary | ICD-10-CM | POA: Diagnosis not present

## 2022-07-03 DIAGNOSIS — I5033 Acute on chronic diastolic (congestive) heart failure: Secondary | ICD-10-CM | POA: Diagnosis not present

## 2022-07-03 DIAGNOSIS — J189 Pneumonia, unspecified organism: Secondary | ICD-10-CM | POA: Diagnosis present

## 2022-07-03 DIAGNOSIS — J690 Pneumonitis due to inhalation of food and vomit: Secondary | ICD-10-CM | POA: Diagnosis not present

## 2022-07-03 DIAGNOSIS — J8410 Pulmonary fibrosis, unspecified: Secondary | ICD-10-CM | POA: Diagnosis not present

## 2022-07-03 DIAGNOSIS — Z7951 Long term (current) use of inhaled steroids: Secondary | ICD-10-CM | POA: Insufficient documentation

## 2022-07-03 DIAGNOSIS — I5032 Chronic diastolic (congestive) heart failure: Secondary | ICD-10-CM | POA: Diagnosis not present

## 2022-07-03 DIAGNOSIS — F32A Depression, unspecified: Secondary | ICD-10-CM | POA: Diagnosis not present

## 2022-07-03 DIAGNOSIS — I071 Rheumatic tricuspid insufficiency: Secondary | ICD-10-CM | POA: Diagnosis not present

## 2022-07-03 DIAGNOSIS — G9341 Metabolic encephalopathy: Secondary | ICD-10-CM | POA: Diagnosis not present

## 2022-07-03 DIAGNOSIS — Z743 Need for continuous supervision: Secondary | ICD-10-CM | POA: Diagnosis not present

## 2022-07-03 DIAGNOSIS — Z87891 Personal history of nicotine dependence: Secondary | ICD-10-CM | POA: Diagnosis not present

## 2022-07-03 DIAGNOSIS — I10 Essential (primary) hypertension: Secondary | ICD-10-CM | POA: Diagnosis not present

## 2022-07-03 DIAGNOSIS — J9602 Acute respiratory failure with hypercapnia: Secondary | ICD-10-CM | POA: Diagnosis not present

## 2022-07-03 DIAGNOSIS — Z9981 Dependence on supplemental oxygen: Secondary | ICD-10-CM | POA: Diagnosis not present

## 2022-07-03 DIAGNOSIS — Z79899 Other long term (current) drug therapy: Secondary | ICD-10-CM | POA: Diagnosis not present

## 2022-07-03 DIAGNOSIS — E785 Hyperlipidemia, unspecified: Secondary | ICD-10-CM | POA: Diagnosis not present

## 2022-07-03 DIAGNOSIS — S42032A Displaced fracture of lateral end of left clavicle, initial encounter for closed fracture: Secondary | ICD-10-CM | POA: Diagnosis not present

## 2022-07-03 DIAGNOSIS — D869 Sarcoidosis, unspecified: Secondary | ICD-10-CM | POA: Diagnosis not present

## 2022-07-03 DIAGNOSIS — R6889 Other general symptoms and signs: Secondary | ICD-10-CM | POA: Diagnosis not present

## 2022-07-03 DIAGNOSIS — I272 Pulmonary hypertension, unspecified: Secondary | ICD-10-CM | POA: Diagnosis not present

## 2022-07-03 DIAGNOSIS — R410 Disorientation, unspecified: Secondary | ICD-10-CM | POA: Diagnosis not present

## 2022-07-03 DIAGNOSIS — E8729 Other acidosis: Secondary | ICD-10-CM | POA: Diagnosis not present

## 2022-07-03 DIAGNOSIS — J441 Chronic obstructive pulmonary disease with (acute) exacerbation: Secondary | ICD-10-CM | POA: Diagnosis not present

## 2022-07-03 DIAGNOSIS — J9621 Acute and chronic respiratory failure with hypoxia: Secondary | ICD-10-CM | POA: Diagnosis not present

## 2022-07-03 DIAGNOSIS — J440 Chronic obstructive pulmonary disease with acute lower respiratory infection: Secondary | ICD-10-CM | POA: Diagnosis not present

## 2022-07-03 DIAGNOSIS — R519 Headache, unspecified: Secondary | ICD-10-CM | POA: Diagnosis not present

## 2022-07-03 MED ORDER — ACETAMINOPHEN 325 MG PO TABS
650.0000 mg | ORAL_TABLET | Freq: Once | ORAL | Status: AC
  Administered 2022-07-03: 650 mg via ORAL
  Filled 2022-07-03: qty 2

## 2022-07-03 MED ORDER — HYDROCODONE-ACETAMINOPHEN 5-325 MG PO TABS: 1.0000 | ORAL_TABLET | ORAL | 0 refills | Status: DC | PRN

## 2022-07-03 NOTE — ED Provider Notes (Signed)
[facility] DEPT Provider Note   CSN: [id number] Arrival date & time: 07/03/22  0756     History COPD, Depression, Sarcoidosis  Chief Complaint  Patient presents with   [name] is a 76 y.o. female.  76 y.o female with a PMH of COPD on 3L [place], GERD,Depression presents to the ED via EMS s/p mechanical fall. Patient is currently sleeping in a hospital bed, reports she "rolled off the bed this morning", landing on the left side of her body striking her left shoulder. She reports pain to the area exacerbate with palpation along with any type of movement. She has not taken any medication for improvement in symptoms. She was recently prescribed Meloxicam for calf pain but has not taken it this morning. She denies striking her head, not on any blood thinners, no LOC, no chest pain or other complaints.   The history is provided by the patient and medical records.  Fall This is a new problem. Pertinent negatives include no chest pain, no abdominal pain, no headaches and no shortness of breath.       Home Medications Prior to Admission medications   Medication Sig Start Date End Date Taking? Authorizing Provider  HYDROcodone-acetaminophen (NORCO/VICODIN) 5-325 MG tablet Take 1 tablet by mouth every 4 (four) hours as needed for up to 3 days. 07/03/22 07/06/22 Yes Robey Massmann, PA-C  albuterol (PROVENTIL) (2.5 MG/3ML) 0.083% nebulizer solution Inhale 3 mLs (2.5 mg total) by nebulization every 6 (six) hours as needed for wheezing. 04/17/22   Danford, Suann Larry, MD  albuterol (VENTOLIN HFA) 108 (90 Base) MCG/ACT inhaler INHALE TWO PUFFS EVERY 6 HOURS AS NEEDED FOR WHEEZING Patient taking differently: Inhale 2 puffs into the lungs every 6 (six) hours as needed for wheezing. INHALE TWO PUFFS EVERY 6 HOURS AS NEEDED FOR WHEEZING 04/02/22   Isaac Bliss, Rayford Halsted, MD  amLODipine (NORVASC) 5 MG tablet Take 1 tablet (5 mg total) by mouth daily. 03/24/22    Isaac Bliss, Rayford Halsted, MD  famotidine (PEPCID) 20 MG tablet TAKE ONE TABLET BY MOUTH TWICE DAILY Patient taking differently: Take 20 mg by mouth 2 (two) times daily. 01/01/22   Isaac Bliss, Rayford Halsted, MD  fluticasone-salmeterol [facility] INHUB) 500-50 MCG/ACT AEPB Inhale 1 puff into the lungs in the morning and at bedtime. 04/17/22   Danford, Suann Larry, MD  gabapentin (NEURONTIN) 100 MG capsule TAKE ONE CAPSULE BY MOUTH THREE TIMES DAILY Patient taking differently: Take 100 mg by mouth 3 (three) times daily. 06/03/22   Isaac Bliss, Rayford Halsted, MD  hydrochlorothiazide (HYDRODIURIL) 25 MG tablet TAKE ONE-HALF TO 1 TABLET BY MOUTH DAILY AS NEEDED FOR SWELLING Patient taking differently: Take 12.5 mg by mouth daily as needed (for swelling). 03/24/22   Isaac Bliss, Rayford Halsted, MD  ipratropium (ATROVENT) 0.02 % nebulizer solution Take 2.5 mLs (0.5 mg total) by nebulization 2 (two) times daily. 06/05/22   Parrett, Fonnie Mu, NP  loratadine (CLARITIN) 10 MG tablet Take 1 tablet (10 mg total) by mouth daily. 10/22/21   Isaac Bliss, Rayford Halsted, MD  melatonin 5 MG TABS Take 2 tablets (10 mg total) by mouth at bedtime. Patient taking differently: Take 5 mg by mouth at bedtime. 04/01/22   Sherrilyn Rist A, MD  meloxicam (MOBIC) 7.5 MG tablet Take 1 tablet (7.5 mg total) by mouth daily. 07/02/22   Isaac Bliss, Rayford Halsted, MD  metoprolol succinate (TOPROL-XL) 50 MG 24 hr tablet TAKE ONE TABLET BY MOUTH  DAILY WITH OR IMMEDIATELY FOLLOWING A MEAL Patient taking differently: Take 50 mg by mouth daily. TAKE ONE TABLET BY MOUTH DAILY WITH OR IMMEDIATELY FOLLOWING A MEAL 01/01/22   Isaac Bliss, Rayford Halsted, MD  mirtazapine (REMERON) 15 MG tablet TAKE ONE TABLET BY MOUTH AT BEDTIME Patient taking differently: Take 15 mg by mouth at bedtime. 11/28/21   Isaac Bliss, Rayford Halsted, MD  pantoprazole (PROTONIX) 40 MG tablet TAKE ONE TABLET BY MOUTH DAILY 05/07/22   Isaac Bliss, Rayford Halsted, MD   pravastatin (PRAVACHOL) 40 MG tablet Take 1 tablet (40 mg total) by mouth every evening. 02/06/22   Isaac Bliss, Rayford Halsted, MD  predniSONE (DELTASONE) 10 MG tablet Take 4 tablets daily X 2 days, then, Take 3 tablets daily X 2 days, then, Take 2 tablets daily X 2 days, then, Take 1 tablets daily X 1 day. 06/17/22   Terrilee Croak, MD      Allergies    Ace inhibitors, Lisinopril, Valsartan, Aspirin, and Penicillins    Review of Systems   Review of Systems  Constitutional:  Negative for fever.  Respiratory:  Negative for shortness of breath.   Cardiovascular:  Negative for chest pain.  Gastrointestinal:  Negative for abdominal pain.  Genitourinary:  Negative for flank pain.  Musculoskeletal:  Positive for arthralgias and back pain.  Neurological:  Negative for light-headedness and headaches.  All other systems reviewed and are negative.   Physical Exam Updated Vital Signs BP (!) 150/51   Pulse 86   Temp 98 F (36.7 C) (Oral)   Resp 20   SpO2 98%  Physical Exam Vitals and nursing note reviewed.  Constitutional:      Appearance: Normal appearance.  HENT:     Head: Normocephalic and atraumatic.     Comments: No visible signs of trauma    Mouth/Throat:     Mouth: Mucous membranes are moist.  Eyes:     Pupils: Pupils are equal, round, and reactive to light.  Cardiovascular:     Rate and Rhythm: Normal rate.  Pulmonary:     Effort: Pulmonary effort is normal.     Breath sounds: No wheezing.     Comments: ON 3L[place] at baseline  Abdominal:     General: Abdomen is flat.     Palpations: Abdomen is soft.     Tenderness: There is no abdominal tenderness.  Musculoskeletal:        General: Tenderness present.     Left shoulder: Deformity and bony tenderness present. No crepitus. Decreased range of motion. Decreased strength. Normal pulse.     Cervical back: Normal range of motion and neck supple.     Comments: Decrease in ROM due to pain  Skin:    General: Skin is warm and dry.   Neurological:     Mental Status: She is alert and oriented to person, place, and time.  Psychiatric:        Behavior: Behavior normal.     ED Results / Procedures / Treatments   Labs (all labs ordered are listed, but only abnormal results are displayed) Labs Reviewed - No data to display  EKG None  Radiology CT HEAD WO CONTRAST (5MM)  Result Date: 07/03/2022 CLINICAL DATA:  Head and neck pain post fall. EXAM: CT HEAD WITHOUT CONTRAST CT CERVICAL SPINE WITHOUT CONTRAST TECHNIQUE: Multidetector CT imaging of the head and cervical spine was performed following the standard protocol without intravenous contrast. Multiplanar CT image reconstructions of the cervical spine were also generated. RADIATION  DOSE REDUCTION: This exam was performed according to the departmental dose-optimization program which includes automated exposure control, adjustment of the mA and/or kV according to patient size and/or use of iterative reconstruction technique. COMPARISON:  CT examination dated June 14, 2022 FINDINGS: CT HEAD FINDINGS Brain: No evidence of acute infarction, hemorrhage, hydrocephalus, extra-axial collection or mass lesion/mass effect. Advanced chronic microvascular ischemic changes of the white matter, unchanged. Vascular: No hyperdense vessel or unexpected calcification. Skull: Normal. Negative for fracture or focal lesion. Sinuses/Orbits: No acute finding.  Bilateral cataract surgery. Other: None CT CERVICAL SPINE FINDINGS Alignment: Mild anterolisthesis of L2. Mild stepwise retrolisthesis of L3, L4 and L5. Skull base and vertebrae: No acute fracture. No primary bone lesion or focal pathologic process. Soft tissues and spinal canal: No prevertebral fluid or swelling. No visible canal hematoma. Disc levels: C2-C3: Bilateral facet joint arthropathy. No significant spinal canal or neural foraminal stenosis. C3-C4: Disc height loss and disc osteophyte complex with spinal canal stenosis.  Mild-to-moderate bilateral neural foraminal stenosis. Mild facet joint arthropathy. C4-C5: Disc height loss and disc osteophyte complex with uncovertebral joint arthropathy. Mild right and moderate left neural foraminal stenosis. Mild facet joint arthropathy. C5-C6: Disc height loss and disc osteophyte complex with mild spinal canal stenosis. Mild-to-moderate bilateral neural foraminal stenosis with facet joint arthropathy. C6-C7: Disc osteophyte complex with mild left neural foraminal stenosis. C7-T1: Mild to moderate left facet joint arthropathy. No significant spinal canal or neural foraminal stenosis. Upper chest: Prominent biapical pleural/parenchymal scarring. Other: None IMPRESSION: CT HEAD: 1. No acute intracranial abnormality. 2. Advanced chronic microvascular ischemic changes of the white matter, unchanged. CT CERVICAL SPINE: 1. No acute fracture or traumatic subluxation. 2. Multilevel degenerative disc and joint disease, as described above. 3. Prominent biapical pleural/parenchymal scarring. Electronically Signed   By: Keane Police D.O.   On: 07/03/2022 09:23   CT Cervical Spine Wo Contrast  Result Date: 07/03/2022 CLINICAL DATA:  Head and neck pain post fall. EXAM: CT HEAD WITHOUT CONTRAST CT CERVICAL SPINE WITHOUT CONTRAST TECHNIQUE: Multidetector CT imaging of the head and cervical spine was performed following the standard protocol without intravenous contrast. Multiplanar CT image reconstructions of the cervical spine were also generated. RADIATION DOSE REDUCTION: This exam was performed according to the departmental dose-optimization program which includes automated exposure control, adjustment of the mA and/or kV according to patient size and/or use of iterative reconstruction technique. COMPARISON:  CT examination dated June 14, 2022 FINDINGS: CT HEAD FINDINGS Brain: No evidence of acute infarction, hemorrhage, hydrocephalus, extra-axial collection or mass lesion/mass effect. Advanced  chronic microvascular ischemic changes of the white matter, unchanged. Vascular: No hyperdense vessel or unexpected calcification. Skull: Normal. Negative for fracture or focal lesion. Sinuses/Orbits: No acute finding.  Bilateral cataract surgery. Other: None CT CERVICAL SPINE FINDINGS Alignment: Mild anterolisthesis of L2. Mild stepwise retrolisthesis of L3, L4 and L5. Skull base and vertebrae: No acute fracture. No primary bone lesion or focal pathologic process. Soft tissues and spinal canal: No prevertebral fluid or swelling. No visible canal hematoma. Disc levels: C2-C3: Bilateral facet joint arthropathy. No significant spinal canal or neural foraminal stenosis. C3-C4: Disc height loss and disc osteophyte complex with spinal canal stenosis. Mild-to-moderate bilateral neural foraminal stenosis. Mild facet joint arthropathy. C4-C5: Disc height loss and disc osteophyte complex with uncovertebral joint arthropathy. Mild right and moderate left neural foraminal stenosis. Mild facet joint arthropathy. C5-C6: Disc height loss and disc osteophyte complex with mild spinal canal stenosis. Mild-to-moderate bilateral neural foraminal stenosis with facet joint arthropathy. C6-C7:  Disc osteophyte complex with mild left neural foraminal stenosis. C7-T1: Mild to moderate left facet joint arthropathy. No significant spinal canal or neural foraminal stenosis. Upper chest: Prominent biapical pleural/parenchymal scarring. Other: None IMPRESSION: CT HEAD: 1. No acute intracranial abnormality. 2. Advanced chronic microvascular ischemic changes of the white matter, unchanged. CT CERVICAL SPINE: 1. No acute fracture or traumatic subluxation. 2. Multilevel degenerative disc and joint disease, as described above. 3. Prominent biapical pleural/parenchymal scarring. Electronically Signed   By: Keane Police D.O.   On: 07/03/2022 09:23   DG Shoulder Left  Result Date: 07/03/2022 CLINICAL DATA:  Fall.  Left shoulder pain. EXAM: LEFT  SHOULDER - 4 VIEW COMPARISON:  Two-view chest x-ray 06/05/2022 FINDINGS: A comminuted fracture of the distal clavicle is displaced up to 1/2 the shaft with. Glenohumeral joint is intact. No additional fractures are present. Extensive chronic fibrotic changes are present in the lung. IMPRESSION: Comminuted fracture of the distal clavicle with up to 1/2 the shaft with displacement. Electronically Signed   By: San Morelle M.D.   On: 07/03/2022 09:20   DG Pelvis Portable  Result Date: 07/03/2022 CLINICAL DATA:  Fall. EXAM: PORTABLE PELVIS 1 VIEWS COMPARISON:  One-view pelvis 04/14/2022. FINDINGS: Right hip arthroplasty noted. No acute fractures are present. Mild degenerative changes are again noted at the left hip. Vascular calcifications are present. Calcified uterine fibroid noted. IMPRESSION: 1. No acute abnormality. 2. Right hip arthroplasty without radiographic evidence for complication. Electronically Signed   By: San Morelle M.D.   On: 07/03/2022 09:17    Procedures Procedures    Medications Ordered in ED Medications  acetaminophen (TYLENOL) tablet 650 mg (650 mg Oral Given 07/03/22 0913)    ED Course/ Medical Decision Making/ A&P                           Medical Decision Making Amount and/or Complexity of Data Reviewed Radiology: ordered.  Risk OTC drugs.   Patient here status post fall via EMS after rolling off her hospital bed at home.  Reports pain along the left shoulder exacerbated with any palpation and movement.  During exam there is limited mobility and unable to fully raise to her left shoulder.  Pain palpable along the left scapula with some deformity noted.  Pulses are present and strength is decreased.  No signs of trauma to her head, she is currently on no blood thinners per chart review.  She does wear oxygen at home 3 L via nasal cannula without any increased work of breathing or tachypnea, her lungs are clear to auscultation.  Able to move bilateral  lower extremities with some pain along the left hip.  Imaging has been ordered for further evaluation.  He was recently prescribed meloxicam according to chart review yesterday for calf pain, she only takes half a tablet a day, will give her Tylenol for pain control at this time.  CT of her head and cervical spine without any acute findings.  X-ray of her pelvis did not show any acute fracture.  X-ray of her left shoulder did show a clavicular fracture Comminuted fracture of the distal clavicle with up to 1/2 the shaft  with displacement.   These results were discussed length with patient, she reports her son-in-law is coming to pick her up.  She was provided with a copy of her x-ray and placed on a sling by Ortho technician and myself.  She does have a current prescription for meloxicam, I  discussed with her she will go home on a short prescription for hydrocodone which after review of PDMP she has taken in the past, to help with severe acute pain.  I did discuss with her she cannot combine both of these medications as this could be too sedating for her.  She is agreeable to plan and treatment, patient hemodynamically stable for discharge.  Portions of this note were generated with [employer]. Dictation errors may occur despite best attempts at proofreading.   Final Clinical Impression(s) / ED Diagnoses Final diagnoses:  Fall, initial encounter  Closed displaced fracture of shaft of left clavicle, initial encounter    Rx / DC Orders ED Discharge Orders          Ordered    HYDROcodone-acetaminophen (NORCO/VICODIN) 5-325 MG tablet  Every 4 hours PRN        07/03/22 1005              [name], PA-C 07/03/22 1006    Malvin Johns, MD 07/03/22 1258

## 2022-07-03 NOTE — ED Notes (Signed)
An After Visit Summary was printed and given to the patient. Discharge instructions given and no further questions at this time.  Pt leaving with family member. Per pt family, pt does not always use oxygen in the car and pt will be fine to go without it until they get her home and put her oxygen back on her. Pt confirms that she will be fine to ride home without oxygen and put it on when she gets home.

## 2022-07-03 NOTE — Discharge Instructions (Addendum)
The x-ray of your left shoulder showed a clavicular fracture, your arm was placed in a sling to help with comfort.  You may follow-up with orthopedist Dr. Alvan Dame, his phone number is attached to your paperwork.  I have placed a prescription for pain medication, you may take 1 tablet every 4 hours to help with pain.  Please be aware you cannot combine this with meloxicam as it could be too sedating for you.

## 2022-07-03 NOTE — ED Triage Notes (Signed)
[facility] reports pt coming from home for fall. Pt landed on her left shoulder, EMS reports obvious deformity, +PMS. No blood thinners.

## 2022-07-03 NOTE — ED Notes (Signed)
Pt coming in to ED on 3L [place] O2, pt states she uses O2 at home, 3L is baseline.

## 2022-07-04 ENCOUNTER — Other Ambulatory Visit: Payer: Self-pay | Admitting: Internal Medicine

## 2022-07-04 DIAGNOSIS — I10 Essential (primary) hypertension: Secondary | ICD-10-CM

## 2022-07-05 ENCOUNTER — Other Ambulatory Visit: Payer: Self-pay

## 2022-07-05 ENCOUNTER — Emergency Department (HOSPITAL_COMMUNITY): Payer: Medicare Other

## 2022-07-05 ENCOUNTER — Inpatient Hospital Stay (HOSPITAL_COMMUNITY)
Admission: EM | Admit: 2022-07-05 | Discharge: 2022-07-14 | DRG: 189 | Disposition: A | Discharge status: Home Health | Payer: Medicare Other | Attending: Internal Medicine | Admitting: Internal Medicine

## 2022-07-05 DIAGNOSIS — J9602 Acute respiratory failure with hypercapnia: Secondary | ICD-10-CM | POA: Diagnosis present

## 2022-07-05 DIAGNOSIS — Z87891 Personal history of nicotine dependence: Secondary | ICD-10-CM

## 2022-07-05 DIAGNOSIS — Z681 Body mass index (BMI) 19 or less, adult: Secondary | ICD-10-CM | POA: Diagnosis not present

## 2022-07-05 DIAGNOSIS — I071 Rheumatic tricuspid insufficiency: Secondary | ICD-10-CM | POA: Diagnosis present

## 2022-07-05 DIAGNOSIS — I5032 Chronic diastolic (congestive) heart failure: Secondary | ICD-10-CM | POA: Diagnosis not present

## 2022-07-05 DIAGNOSIS — E785 Hyperlipidemia, unspecified: Secondary | ICD-10-CM | POA: Diagnosis present

## 2022-07-05 DIAGNOSIS — S42032A Displaced fracture of lateral end of left clavicle, initial encounter for closed fracture: Secondary | ICD-10-CM | POA: Diagnosis present

## 2022-07-05 DIAGNOSIS — Z833 Family history of diabetes mellitus: Secondary | ICD-10-CM

## 2022-07-05 DIAGNOSIS — J9621 Acute and chronic respiratory failure with hypoxia: Secondary | ICD-10-CM | POA: Diagnosis present

## 2022-07-05 DIAGNOSIS — I272 Pulmonary hypertension, unspecified: Secondary | ICD-10-CM | POA: Diagnosis present

## 2022-07-05 DIAGNOSIS — Z8249 Family history of ischemic heart disease and other diseases of the circulatory system: Secondary | ICD-10-CM

## 2022-07-05 DIAGNOSIS — I110 Hypertensive heart disease with heart failure: Secondary | ICD-10-CM | POA: Diagnosis not present

## 2022-07-05 DIAGNOSIS — S42009A Fracture of unspecified part of unspecified clavicle, initial encounter for closed fracture: Secondary | ICD-10-CM | POA: Diagnosis not present

## 2022-07-05 DIAGNOSIS — Z825 Family history of asthma and other chronic lower respiratory diseases: Secondary | ICD-10-CM

## 2022-07-05 DIAGNOSIS — E559 Vitamin D deficiency, unspecified: Secondary | ICD-10-CM | POA: Diagnosis present

## 2022-07-05 DIAGNOSIS — I10 Essential (primary) hypertension: Secondary | ICD-10-CM | POA: Diagnosis present

## 2022-07-05 DIAGNOSIS — Z79899 Other long term (current) drug therapy: Secondary | ICD-10-CM

## 2022-07-05 DIAGNOSIS — H5462 Unqualified visual loss, left eye, normal vision right eye: Secondary | ICD-10-CM | POA: Diagnosis present

## 2022-07-05 DIAGNOSIS — Z9981 Dependence on supplemental oxygen: Secondary | ICD-10-CM | POA: Diagnosis not present

## 2022-07-05 DIAGNOSIS — Z886 Allergy status to analgesic agent status: Secondary | ICD-10-CM

## 2022-07-05 DIAGNOSIS — J439 Emphysema, unspecified: Secondary | ICD-10-CM | POA: Diagnosis present

## 2022-07-05 DIAGNOSIS — E8729 Other acidosis: Secondary | ICD-10-CM | POA: Diagnosis not present

## 2022-07-05 DIAGNOSIS — Z803 Family history of malignant neoplasm of breast: Secondary | ICD-10-CM

## 2022-07-05 DIAGNOSIS — Z791 Long term (current) use of non-steroidal anti-inflammatories (NSAID): Secondary | ICD-10-CM

## 2022-07-05 DIAGNOSIS — F32A Depression, unspecified: Secondary | ICD-10-CM | POA: Diagnosis not present

## 2022-07-05 DIAGNOSIS — Z888 Allergy status to other drugs, medicaments and biological substances status: Secondary | ICD-10-CM

## 2022-07-05 DIAGNOSIS — J440 Chronic obstructive pulmonary disease with acute lower respiratory infection: Secondary | ICD-10-CM | POA: Diagnosis present

## 2022-07-05 DIAGNOSIS — J189 Pneumonia, unspecified organism: Secondary | ICD-10-CM | POA: Diagnosis present

## 2022-07-05 DIAGNOSIS — J690 Pneumonitis due to inhalation of food and vomit: Secondary | ICD-10-CM | POA: Diagnosis not present

## 2022-07-05 DIAGNOSIS — A419 Sepsis, unspecified organism: Secondary | ICD-10-CM | POA: Diagnosis not present

## 2022-07-05 DIAGNOSIS — I5033 Acute on chronic diastolic (congestive) heart failure: Secondary | ICD-10-CM | POA: Diagnosis present

## 2022-07-05 DIAGNOSIS — Z7951 Long term (current) use of inhaled steroids: Secondary | ICD-10-CM

## 2022-07-05 DIAGNOSIS — J8410 Pulmonary fibrosis, unspecified: Secondary | ICD-10-CM | POA: Diagnosis present

## 2022-07-05 DIAGNOSIS — Z96641 Presence of right artificial hip joint: Secondary | ICD-10-CM | POA: Diagnosis present

## 2022-07-05 DIAGNOSIS — R54 Age-related physical debility: Secondary | ICD-10-CM | POA: Diagnosis present

## 2022-07-05 DIAGNOSIS — J441 Chronic obstructive pulmonary disease with (acute) exacerbation: Secondary | ICD-10-CM | POA: Diagnosis present

## 2022-07-05 DIAGNOSIS — Z20822 Contact with and (suspected) exposure to covid-19: Secondary | ICD-10-CM | POA: Diagnosis present

## 2022-07-05 DIAGNOSIS — R636 Underweight: Secondary | ICD-10-CM | POA: Diagnosis present

## 2022-07-05 DIAGNOSIS — D869 Sarcoidosis, unspecified: Secondary | ICD-10-CM | POA: Diagnosis not present

## 2022-07-05 DIAGNOSIS — J9622 Acute and chronic respiratory failure with hypercapnia: Principal | ICD-10-CM | POA: Diagnosis present

## 2022-07-05 DIAGNOSIS — K219 Gastro-esophageal reflux disease without esophagitis: Secondary | ICD-10-CM | POA: Diagnosis present

## 2022-07-05 DIAGNOSIS — S42022A Displaced fracture of shaft of left clavicle, initial encounter for closed fracture: Secondary | ICD-10-CM | POA: Diagnosis present

## 2022-07-05 DIAGNOSIS — Z880 Allergy status to penicillin: Secondary | ICD-10-CM

## 2022-07-05 DIAGNOSIS — W06XXXA Fall from bed, initial encounter: Secondary | ICD-10-CM | POA: Diagnosis present

## 2022-07-05 DIAGNOSIS — D862 Sarcoidosis of lung with sarcoidosis of lymph nodes: Secondary | ICD-10-CM | POA: Diagnosis present

## 2022-07-05 DIAGNOSIS — Z823 Family history of stroke: Secondary | ICD-10-CM

## 2022-07-05 DIAGNOSIS — Z9851 Tubal ligation status: Secondary | ICD-10-CM

## 2022-07-05 DIAGNOSIS — H269 Unspecified cataract: Secondary | ICD-10-CM | POA: Diagnosis present

## 2022-07-05 DIAGNOSIS — G9341 Metabolic encephalopathy: Secondary | ICD-10-CM | POA: Diagnosis present

## 2022-07-05 DIAGNOSIS — Z809 Family history of malignant neoplasm, unspecified: Secondary | ICD-10-CM

## 2022-07-05 DIAGNOSIS — M810 Age-related osteoporosis without current pathological fracture: Secondary | ICD-10-CM | POA: Diagnosis present

## 2022-07-05 DIAGNOSIS — Z9841 Cataract extraction status, right eye: Secondary | ICD-10-CM

## 2022-07-05 LAB — CBC WITH DIFFERENTIAL/PLATELET
Abs Immature Granulocytes: 0.08 10*3/uL — ABNORMAL HIGH (ref 0.00–0.07)
Basophils Absolute: 0 10*3/uL (ref 0.0–0.1)
Basophils Relative: 0 %
Eosinophils Absolute: 0.2 10*3/uL (ref 0.0–0.5)
Eosinophils Relative: 1 %
HCT: 37.5 % (ref 36.0–46.0)
Hemoglobin: 11.2 g/dL — ABNORMAL LOW (ref 12.0–15.0)
Immature Granulocytes: 1 %
Lymphocytes Relative: 9 %
Lymphs Abs: 1.2 10*3/uL (ref 0.7–4.0)
MCH: 24.5 pg — ABNORMAL LOW (ref 26.0–34.0)
MCHC: 29.9 g/dL — ABNORMAL LOW (ref 30.0–36.0)
MCV: 82.1 fL (ref 80.0–100.0)
Monocytes Absolute: 0.9 10*3/uL (ref 0.1–1.0)
Monocytes Relative: 7 %
Neutro Abs: 10.5 10*3/uL — ABNORMAL HIGH (ref 1.7–7.7)
Neutrophils Relative %: 82 %
Platelets: 378 10*3/uL (ref 150–400)
RBC: 4.57 MIL/uL (ref 3.87–5.11)
RDW: 16 % — ABNORMAL HIGH (ref 11.5–15.5)
WBC: 12.8 10*3/uL — ABNORMAL HIGH (ref 4.0–10.5)
nRBC: 0 % (ref 0.0–0.2)

## 2022-07-05 LAB — BLOOD GAS, VENOUS
Acid-Base Excess: 7.8 mmol/L — ABNORMAL HIGH (ref 0.0–2.0)
Bicarbonate: 39.3 mmol/L — ABNORMAL HIGH (ref 20.0–28.0)
O2 Saturation: 44.3 %
Patient temperature: 36.1
pCO2, Ven: 92 mmHg (ref 44–60)
pH, Ven: 7.23 — ABNORMAL LOW (ref 7.25–7.43)
pO2, Ven: 32 mmHg (ref 32–45)

## 2022-07-05 LAB — COMPREHENSIVE METABOLIC PANEL
ALT: 10 U/L (ref 0–44)
AST: 14 U/L — ABNORMAL LOW (ref 15–41)
Albumin: 2.8 g/dL — ABNORMAL LOW (ref 3.5–5.0)
Alkaline Phosphatase: 72 U/L (ref 38–126)
Anion gap: 9 (ref 5–15)
BUN: 18 mg/dL (ref 8–23)
CO2: 34 mmol/L — ABNORMAL HIGH (ref 22–32)
Calcium: 9 mg/dL (ref 8.9–10.3)
Chloride: 90 mmol/L — ABNORMAL LOW (ref 98–111)
Creatinine, Ser: 0.97 mg/dL (ref 0.44–1.00)
GFR, Estimated: >60 mL/min (ref >60)
Glucose, Bld: 112 mg/dL — ABNORMAL HIGH (ref 70–99)
Potassium: 4.3 mmol/L (ref 3.5–5.1)
Sodium: 133 mmol/L — ABNORMAL LOW (ref 135–145)
Total Bilirubin: 0.5 mg/dL (ref 0.3–1.2)
Total Protein: 7.1 g/dL (ref 6.5–8.1)

## 2022-07-05 LAB — MRSA NEXT GEN BY PCR, NASAL: MRSA by PCR Next Gen: NOT DETECTED

## 2022-07-05 LAB — RESP PANEL BY RT-PCR (FLU A&B, COVID) ARPGX2
Influenza A by PCR: NEGATIVE
Influenza B by PCR: NEGATIVE
SARS Coronavirus 2 by RT PCR: NEGATIVE

## 2022-07-05 LAB — PROCALCITONIN: Procalcitonin: 0.14 ng/mL

## 2022-07-05 MED ORDER — PREDNISONE 20 MG PO TABS
40.0000 mg | ORAL_TABLET | Freq: Every day | ORAL | Status: DC
  Administered 2022-07-07 – 2022-07-09 (×3): 40 mg via ORAL
  Filled 2022-07-05 (×3): qty 2

## 2022-07-05 MED ORDER — MELATONIN 3 MG PO TABS
3.0000 mg | ORAL_TABLET | Freq: Once | ORAL | Status: AC
  Administered 2022-07-05: 3 mg via ORAL
  Filled 2022-07-05: qty 1

## 2022-07-05 MED ORDER — ORAL CARE MOUTH RINSE
15.0000 mL | OROMUCOSAL | Status: DC
  Administered 2022-07-05 – 2022-07-14 (×29): 15 mL via OROMUCOSAL

## 2022-07-05 MED ORDER — IPRATROPIUM-ALBUTEROL 0.5-2.5 (3) MG/3ML IN SOLN
3.0000 mL | Freq: Four times a day (QID) | RESPIRATORY_TRACT | Status: DC
  Administered 2022-07-05 – 2022-07-09 (×16): 3 mL via RESPIRATORY_TRACT
  Filled 2022-07-05 (×16): qty 3

## 2022-07-05 MED ORDER — VANCOMYCIN HCL 1250 MG/250ML IV SOLN
1250.0000 mg | INTRAVENOUS | Status: DC
  Administered 2022-07-05: 1250 mg via INTRAVENOUS
  Filled 2022-07-05: qty 250

## 2022-07-05 MED ORDER — SODIUM CHLORIDE 0.9 % IV SOLN
2.0000 g | Freq: Two times a day (BID) | INTRAVENOUS | Status: AC
  Administered 2022-07-05 – 2022-07-12 (×14): 2 g via INTRAVENOUS
  Filled 2022-07-05 (×14): qty 12.5

## 2022-07-05 MED ORDER — ACETAMINOPHEN 325 MG PO TABS
650.0000 mg | ORAL_TABLET | Freq: Four times a day (QID) | ORAL | Status: DC | PRN
  Administered 2022-07-07 – 2022-07-13 (×2): 650 mg via ORAL
  Filled 2022-07-05 (×3): qty 2

## 2022-07-05 MED ORDER — PROCHLORPERAZINE EDISYLATE 10 MG/2ML IJ SOLN: 10.0000 mg | Freq: Four times a day (QID) | INTRAMUSCULAR | Status: DC | PRN

## 2022-07-05 MED ORDER — SODIUM CHLORIDE 0.9 % IV SOLN: INTRAVENOUS | Status: DC

## 2022-07-05 MED ORDER — ACETAMINOPHEN 650 MG RE SUPP: 650.0000 mg | Freq: Four times a day (QID) | RECTAL | Status: DC | PRN

## 2022-07-05 MED ORDER — ENOXAPARIN SODIUM 30 MG/0.3ML IJ SOSY
30.0000 mg | PREFILLED_SYRINGE | INTRAMUSCULAR | Status: DC
  Administered 2022-07-05: 30 mg via SUBCUTANEOUS
  Filled 2022-07-05: qty 0.3

## 2022-07-05 MED ORDER — METHYLPREDNISOLONE SODIUM SUCC 125 MG IJ SOLR
125.0000 mg | Freq: Once | INTRAMUSCULAR | Status: AC
  Administered 2022-07-05: 125 mg via INTRAVENOUS
  Filled 2022-07-05: qty 2

## 2022-07-05 MED ORDER — METHYLPREDNISOLONE SODIUM SUCC 40 MG IJ SOLR
40.0000 mg | Freq: Two times a day (BID) | INTRAMUSCULAR | Status: AC
  Administered 2022-07-06 (×2): 40 mg via INTRAVENOUS
  Filled 2022-07-05 (×2): qty 1

## 2022-07-05 MED ORDER — SODIUM CHLORIDE 0.9 % IV BOLUS
1000.0000 mL | Freq: Once | INTRAVENOUS | Status: AC
  Administered 2022-07-05: 1000 mL via INTRAVENOUS

## 2022-07-05 MED ORDER — CHLORHEXIDINE GLUCONATE CLOTH 2 % EX PADS
6.0000 | MEDICATED_PAD | Freq: Every day | CUTANEOUS | Status: DC
  Administered 2022-07-05 – 2022-07-09 (×5): 6 via TOPICAL

## 2022-07-05 MED ORDER — ORAL CARE MOUTH RINSE: 15.0000 mL | OROMUCOSAL | Status: DC | PRN

## 2022-07-05 NOTE — ED Provider Notes (Signed)
[facility] DEPT Provider Note   CSN: [id number] Arrival date & time: 07/05/22  1057     History  Chief Complaint  Patient presents with   Altered Mental Status    [name] is a 76 y.o. female.  76 yo F with a chief complaints of confusion.  That was noticed this morning.  Patient had recently fallen and suffered a left clavicle fracture.  She has pain medicine for this at home.  Took a pain pill last night when she is having difficulty sleeping.  Was a bit confused today.  Found without her oxygen and.  And had her oxygen titrated up from 2 to 3 L.  The patient tells me that she has been coughing and congested.  Was not noted by EMS prior to transport.        Home Medications Prior to Admission medications   Medication Sig Start Date End Date Taking? Authorizing Provider  albuterol (PROVENTIL) (2.5 MG/3ML) 0.083% nebulizer solution Inhale 3 mLs (2.5 mg total) by nebulization every 6 (six) hours as needed for wheezing. 04/17/22   Danford, Suann Larry, MD  albuterol (VENTOLIN HFA) 108 (90 Base) MCG/ACT inhaler INHALE TWO PUFFS EVERY 6 HOURS AS NEEDED FOR WHEEZING Patient taking differently: Inhale 2 puffs into the lungs every 6 (six) hours as needed for wheezing. INHALE TWO PUFFS EVERY 6 HOURS AS NEEDED FOR WHEEZING 04/02/22   Isaac Bliss, Rayford Halsted, MD  amLODipine (NORVASC) 5 MG tablet Take 1 tablet (5 mg total) by mouth daily. 03/24/22   Isaac Bliss, Rayford Halsted, MD  famotidine (PEPCID) 20 MG tablet TAKE ONE TABLET BY MOUTH TWICE DAILY Patient taking differently: Take 20 mg by mouth 2 (two) times daily. 01/01/22   Isaac Bliss, Rayford Halsted, MD  fluticasone-salmeterol [facility] INHUB) 500-50 MCG/ACT AEPB Inhale 1 puff into the lungs in the morning and at bedtime. 04/17/22   Danford, Suann Larry, MD  gabapentin (NEURONTIN) 100 MG capsule TAKE ONE CAPSULE BY MOUTH THREE TIMES DAILY Patient taking differently: Take 100 mg by mouth 3 (three)  times daily. 06/03/22   Isaac Bliss, Rayford Halsted, MD  hydrochlorothiazide (HYDRODIURIL) 25 MG tablet TAKE ONE-HALF TO 1 TABLET BY MOUTH DAILY AS NEEDED FOR SWELLING Patient taking differently: Take 12.5 mg by mouth daily as needed (for swelling). 03/24/22   Isaac Bliss, Rayford Halsted, MD  HYDROcodone-acetaminophen (NORCO/VICODIN) 5-325 MG tablet Take 1 tablet by mouth every 4 (four) hours as needed for up to 3 days. 07/03/22 07/06/22  [name], PA-C  ipratropium (ATROVENT) 0.02 % nebulizer solution Take 2.5 mLs (0.5 mg total) by nebulization 2 (two) times daily. 06/05/22   Parrett, Fonnie Mu, NP  loratadine (CLARITIN) 10 MG tablet Take 1 tablet (10 mg total) by mouth daily. 10/22/21   Isaac Bliss, Rayford Halsted, MD  melatonin 5 MG TABS Take 2 tablets (10 mg total) by mouth at bedtime. Patient taking differently: Take 5 mg by mouth at bedtime. 04/01/22   Sherrilyn Rist A, MD  meloxicam (MOBIC) 7.5 MG tablet Take 1 tablet (7.5 mg total) by mouth daily. 07/02/22   Isaac Bliss, Rayford Halsted, MD  metoprolol succinate (TOPROL-XL) 50 MG 24 hr tablet TAKE ONE TABLET BY MOUTH DAILY WITH OR IMMEDIATELY FOLLOWING A MEAL Patient taking differently: Take 50 mg by mouth daily. TAKE ONE TABLET BY MOUTH DAILY WITH OR IMMEDIATELY FOLLOWING A MEAL 01/01/22   Isaac Bliss, Rayford Halsted, MD  mirtazapine (REMERON) 15 MG tablet TAKE ONE TABLET BY MOUTH AT BEDTIME  Patient taking differently: Take 15 mg by mouth at bedtime. 11/28/21   Isaac Bliss, Rayford Halsted, MD  pantoprazole (PROTONIX) 40 MG tablet TAKE ONE TABLET BY MOUTH DAILY 05/07/22   Isaac Bliss, Rayford Halsted, MD  pravastatin (PRAVACHOL) 40 MG tablet Take 1 tablet (40 mg total) by mouth every evening. 02/06/22   Isaac Bliss, Rayford Halsted, MD  predniSONE (DELTASONE) 10 MG tablet Take 4 tablets daily X 2 days, then, Take 3 tablets daily X 2 days, then, Take 2 tablets daily X 2 days, then, Take 1 tablets daily X 1 day. 06/17/22   Terrilee Croak, MD      Allergies     Ace inhibitors, Lisinopril, Valsartan, Aspirin, and Penicillins    Review of Systems   Review of Systems  Physical Exam Updated Vital Signs BP 133/68   Pulse 91   Temp 99.3 F (37.4 C) (Oral)   Resp (!) 27   Ht '5\' 3"'$  (1.6 m)   Wt 47.6 kg   SpO2 98%   BMI 18.60 kg/m  Physical Exam Vitals and nursing note reviewed.  Constitutional:      General: She is not in acute distress.    Appearance: She is well-developed. She is not diaphoretic.  HENT:     Head: Normocephalic and atraumatic.  Eyes:     Pupils: Pupils are equal, round, and reactive to light.  Cardiovascular:     Rate and Rhythm: Normal rate and regular rhythm.     Heart sounds: No murmur heard.    No friction rub. No gallop.  Pulmonary:     Effort: Pulmonary effort is normal.     Breath sounds: No wheezing or rales.  Abdominal:     General: There is no distension.     Palpations: Abdomen is soft.     Tenderness: There is no abdominal tenderness.  Musculoskeletal:        General: No tenderness.     Cervical back: Normal range of motion and neck supple.  Skin:    General: Skin is warm and dry.  Neurological:     Mental Status: She is alert.     Comments: Confused response.  Patient tells me that she is on vacation.  Psychiatric:        Behavior: Behavior normal.     ED Results / Procedures / Treatments   Labs (all labs ordered are listed, but only abnormal results are displayed) Labs Reviewed  CBC WITH DIFFERENTIAL/PLATELET - Abnormal; Notable for the following components:      Result Value   WBC 12.8 (*)    Hemoglobin 11.2 (*)    MCH 24.5 (*)    MCHC 29.9 (*)    RDW 16.0 (*)    Neutro Abs 10.5 (*)    Abs Immature Granulocytes 0.08 (*)    All other components within normal limits  BLOOD GAS, VENOUS - Abnormal; Notable for the following components:   pH, Ven 7.23 (*)    pCO2, Ven 92 (*)    Bicarbonate 39.3 (*)    Acid-Base Excess 7.8 (*)    All other components within normal limits   COMPREHENSIVE METABOLIC PANEL - Abnormal; Notable for the following components:   Sodium 133 (*)    Chloride 90 (*)    CO2 34 (*)    Glucose, Bld 112 (*)    Albumin 2.8 (*)    AST 14 (*)    All other components within normal limits  RESP PANEL BY RT-PCR (FLU A&B,  COVID) ARPGX2  URINALYSIS, ROUTINE W REFLEX MICROSCOPIC    EKG None  Radiology CT Head Wo Contrast  Result Date: 07/05/2022 CLINICAL DATA:  Delirium.  Altered mental status. EXAM: CT HEAD WITHOUT CONTRAST TECHNIQUE: Contiguous axial images were obtained from the base of the skull through the vertex without intravenous contrast. RADIATION DOSE REDUCTION: This exam was performed according to the departmental dose-optimization program which includes automated exposure control, adjustment of the mA and/or kV according to patient size and/or use of iterative reconstruction technique. COMPARISON:  07/03/2022 FINDINGS: Brain: There is no evidence for acute hemorrhage, hydrocephalus, mass lesion, or abnormal extra-axial fluid collection. No definite CT evidence for acute infarction. Patchy low attenuation in the deep hemispheric and periventricular white matter is nonspecific, but likely reflects chronic microvascular ischemic demyelination. Vascular: No hyperdense vessel or unexpected calcification. Skull: No evidence for fracture. No worrisome lytic or sclerotic lesion. Sinuses/Orbits: The visualized paranasal sinuses and mastoid air cells are clear. Visualized portions of the globes and intraorbital fat are unremarkable. Other: None. IMPRESSION: 1. Stable.  No acute intracranial abnormality. 2. Advanced chronic small vessel ischemic disease. Electronically Signed   By: Misty Stanley M.D.   On: 07/05/2022 12:59   DG Chest Port 1 View  Result Date: 07/05/2022 CLINICAL DATA:  Shortness of breath. EXAM: PORTABLE CHEST 1 VIEW COMPARISON:  June 14, 2022. FINDINGS: Stable cardiomediastinal silhouette. Continued presence of a large number of  calcified mediastinal and hilar lymph nodes consistent with sarcoidosis. Continued presence of bilateral lung opacities are noted consistent with sarcoidosis. However, there is interval development of lobular density seen in the right midlung which may represent focal inflammation or pneumonia. Also noted is mildly angulated distal left clavicular fracture. IMPRESSION: Chronic findings are again noted consistent with history of sarcoidosis. There is interval development of lobular density seen in right midlung which may represent focal inflammation or pneumonia. Radiographic follow-up is recommended to ensure resolution or stability. Also noted is mildly angulated distal left clavicular fracture. Electronically Signed   By: Marijo Conception M.D.   On: 07/05/2022 11:32    Procedures .Critical Care  Performed by: Deno Etienne, DO Authorized by: Deno Etienne, DO   Critical care provider statement:    Critical care time (minutes):  35   Critical care time was exclusive of:  Separately billable procedures and treating other patients   Critical care was time spent personally by me on the following activities:  Development of treatment plan with patient or surrogate, discussions with consultants, evaluation of patient's response to treatment, examination of patient, ordering and review of laboratory studies, ordering and review of radiographic studies, ordering and performing treatments and interventions, pulse oximetry, re-evaluation of patient's condition and review of old charts   Care discussed with: admitting provider       Medications Ordered in ED Medications  sodium chloride 0.9 % bolus 1,000 mL (1,000 mLs Intravenous New Bag/Given 07/05/22 1353)    ED Course/ Medical Decision Making/ A&P                           Medical Decision Making Amount and/or Complexity of Data Reviewed Labs: ordered. Radiology: ordered.   76 yo F with a chief complaints of altered mental status.  By timeline it  sounds most likely that the patient took a new pain pill and that made her acutely confused.  She did recently fall couple days ago.  Will obtain a CT of the head.  Blood work.  She also suffers from COPD and has been admitted to the hospital for hypercarbic respiratory failure in the past.  Will obtain a VBG.  VBG with hypercarbia.    Patient was placed on BiPAP.  On my repeat assessment was doing quite a bit better as far as her mentation.    Patient has a mild leukocytosis.  Chest x-ray with signs of sarcoidosis.  No reported cough with EMS but for like she was coughing and reviewed.  I discussed the case with hospitalist.  Will admit.  The patients results and plan were reviewed and discussed.   Any x-rays performed were independently reviewed by myself.   Differential diagnosis were considered with the presenting HPI.  Medications  sodium chloride 0.9 % bolus 1,000 mL (1,000 mLs Intravenous New Bag/Given 07/05/22 1353)    Vitals:   07/05/22 1315 07/05/22 1345 07/05/22 1355 07/05/22 1402  BP: (!) 104/58 118/62  133/68  Pulse: 88 95 93 91  Resp: 16 (!) 24  (!) 27  Temp:      TempSrc:      SpO2: 95% 90% (!) 82% 98%  Weight:      Height:        Final diagnoses:  Acute respiratory failure with hypercapnia (HCC)    Admission/ observation were discussed with the admitting physician, patient and/or family and they are comfortable with the plan.           Final Clinical Impression(s) / ED Diagnoses Final diagnoses:  Acute respiratory failure with hypercapnia [facility])    Rx / DC Orders ED Discharge Orders     None         Deno Etienne, DO 07/05/22 1438

## 2022-07-05 NOTE — ED Notes (Signed)
Date and time results received: 07/05/22 12:20 PM  (use smartphrase ".now" to insert current time)  Test: Venous PCo2 Critical Value: 35  Name of Provider Notified: Dr. Tyrone Nine  Orders Received? Or Actions Taken?: see chart

## 2022-07-05 NOTE — ED Triage Notes (Signed)
Pt BIBA from home. Pt woke this AM, with AMS (making odd statements). Pt's [place] was off face, normally on 2L baseline. Pt's family placed them onto 3L.  Pt has been lethargic.  Aox2

## 2022-07-05 NOTE — Progress Notes (Signed)
Pharmacy Antibiotic Note  [name] is a 76 y.o. female admitted on 07/05/2022 with HCAP.  Pharmacy has been consulted for Vancomycin, Cefepime dosing.  Afebrile, WBC 12.8 Note reported PCN allergy:  low risk, unknown childhood reaction.  Has previously received cephalosporins (most recently 04/2022).    Plan: Cefepime 2g IV q12h Vancomycin 1250 mg IV q48h  (SCr 0.97, TBW < IBW, Est AUC 518) Measure Vanc levels as needed.  Goal AUC = 400 - 550 Follow up renal function, culture results, and clinical course.   Height: '5\' 3"'$  (160 cm) Weight: 47.6 kg (104 lb 15.7 oz) IBW/kg (Calculated) : 52.4  Temp (24hrs), Avg:99.3 F (37.4 C), Min:99.3 F (37.4 C), Max:99.3 F (37.4 C)  Recent Labs  Lab 07/05/22 1200 07/05/22 1303  WBC 12.8*  --   CREATININE  --  0.97    Estimated Creatinine Clearance: 37.1 mL/min (by C-G formula based on SCr of 0.97 mg/dL).    Allergies  Allergen Reactions   Ace Inhibitors Swelling   Lisinopril Swelling    REACTION: Angioedema   Valsartan Swelling    REACTION: angioedema.  Pt should not get ACEI or ARBs of any kind due to angioedema.   Aspirin Other (See Comments)    REACTION: GI ulcer.   Penicillins     Childhood allergy Has patient had a PCN reaction causing immediate rash, facial/tongue/throat swelling, SOB or lightheadedness with hypotension: Unknown Has patient had a PCN reaction causing severe rash involving mucus membranes or skin necrosis: Unknown Has patient had a PCN reaction that required hospitalization: Unknown Has patient had a PCN reaction occurring within the last 10 years: Unknown If all of the above answers are "NO", then may proceed with Cephalosporin use.     Antimicrobials this admission: 11/25 Cefepime >> 11/25 Vancomycin >>   Dose adjustments this admission:   Microbiology results: 11/25 Resp panel: 11/25 [facility] PCR:    Thank you for allowing pharmacy to be a part of this patient's care.  Gretta Arab  PharmD, BCPS [facility] main pharmacy [phone] 07/05/2022 3:17 PM

## 2022-07-05 NOTE — H&P (Signed)
History and Physical    Patient: [name]:[id number] DOB: [birth year] DOA: 07/05/2022 DOS: the patient was seen and examined on 07/05/2022 PCP: Isaac Bliss, Rayford Halsted, MD  Patient coming from: Home  Chief Complaint:  Chief Complaint  Patient presents with   Altered Mental Status   HPI: [name] is a 76 y.o. 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 Presenting with shortness of breath. Hx is from daughter by phone. She says the patient was complaining of arm pain this morning at 3am. The patient was given some pain medicine and she went back to sleep. Around 9:30am, the patient seemed confused. She was mumbling and looking off into space. Family became concerned and called for EMS.  Review of Systems: As mentioned in the history of present illness. All other systems reviewed and are negative. 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   Depression 06/08/2007   Essential hypertension, benign 06/08/2007   Family history of breast cancer    GERD 01/18/2009   Osteoporosis 12/31/2012   Left femur had T score -2.5 on DEXA scan 02/26/2009.    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   Vitamin D deficiency    Past Surgical History:  Procedure Laterality Date   CATARACT EXTRACTION Right    COLONOSCOPY  2002   HIP ARTHROPLASTY Right    HIP CLOSED REDUCTION Right 04/08/2021   Procedure: CLOSED REDUCTION HIP;  Surgeon: Mcarthur Rossetti, MD;  Location: [facility];  Service: Orthopedics;  Laterality: Right;   HIP CLOSED REDUCTION Right 09/15/2021   Procedure: CLOSED REDUCTION HIP;   Surgeon: Melina Schools, MD;  Location: [facility];  Service: Orthopedics;  Laterality: Right;   TUBAL LIGATION     Social History:  reports that she quit smoking about 12 years ago. Her smoking use included cigarettes. She has a 7.50 pack-year smoking history. She has never used smokeless tobacco. She reports that she does not currently use alcohol. She reports that she does not use drugs.  Allergies  Allergen Reactions   Ace Inhibitors Swelling   Lisinopril Swelling    REACTION: Angioedema   Valsartan Swelling    REACTION: angioedema.  Pt should not get ACEI or ARBs of any kind due to angioedema.   Aspirin Other (See Comments)    REACTION: GI ulcer.   Penicillins     Childhood allergy Has patient had a PCN reaction causing immediate rash, facial/tongue/throat swelling, SOB or lightheadedness with hypotension: Unknown Has patient had a PCN reaction causing severe rash involving mucus membranes or skin necrosis: Unknown Has patient had a PCN reaction that required hospitalization: Unknown Has patient had a PCN reaction occurring within the last 10 years: Unknown If all of the above answers are "NO", then may proceed with Cephalosporin use.     Family History  Problem Relation Age of Onset   Heart disease Mother    Breast cancer Mother 75   Stroke Father    Heart disease Sister    Asthma Daughter    Cancer Other    Diabetes Other    Hypertension  Other    COPD Brother        "9/11 survivor"   COPD Sister        "9/11 survivor"   Breast cancer Maternal Grandmother        dx in her 23s   Breast cancer Other        MGMs mother died in her 20s   Colon cancer Neg Hx     Prior to Admission medications   Medication Sig Start Date End Date Taking? Authorizing Provider  albuterol (PROVENTIL) (2.5 MG/3ML) 0.083% nebulizer solution Inhale 3 mLs (2.5 mg total) by nebulization every 6 (six) hours as needed for wheezing. 04/17/22   Danford, Suann Larry, MD  albuterol (VENTOLIN HFA) 108  (90 Base) MCG/ACT inhaler INHALE TWO PUFFS EVERY 6 HOURS AS NEEDED FOR WHEEZING Patient taking differently: Inhale 2 puffs into the lungs every 6 (six) hours as needed for wheezing. INHALE TWO PUFFS EVERY 6 HOURS AS NEEDED FOR WHEEZING 04/02/22   Isaac Bliss, Rayford Halsted, MD  amLODipine (NORVASC) 5 MG tablet Take 1 tablet (5 mg total) by mouth daily. 03/24/22   Isaac Bliss, Rayford Halsted, MD  famotidine (PEPCID) 20 MG tablet TAKE ONE TABLET BY MOUTH TWICE DAILY Patient taking differently: Take 20 mg by mouth 2 (two) times daily. 01/01/22   Isaac Bliss, Rayford Halsted, MD  fluticasone-salmeterol [facility] INHUB) 500-50 MCG/ACT AEPB Inhale 1 puff into the lungs in the morning and at bedtime. 04/17/22   Danford, Suann Larry, MD  gabapentin (NEURONTIN) 100 MG capsule TAKE ONE CAPSULE BY MOUTH THREE TIMES DAILY Patient taking differently: Take 100 mg by mouth 3 (three) times daily. 06/03/22   Isaac Bliss, Rayford Halsted, MD  hydrochlorothiazide (HYDRODIURIL) 25 MG tablet TAKE ONE-HALF TO 1 TABLET BY MOUTH DAILY AS NEEDED FOR SWELLING Patient taking differently: Take 12.5 mg by mouth daily as needed (for swelling). 03/24/22   Isaac Bliss, Rayford Halsted, MD  HYDROcodone-acetaminophen (NORCO/VICODIN) 5-325 MG tablet Take 1 tablet by mouth every 4 (four) hours as needed for up to 3 days. 07/03/22 07/06/22  [name], PA-C  ipratropium (ATROVENT) 0.02 % nebulizer solution Take 2.5 mLs (0.5 mg total) by nebulization 2 (two) times daily. 06/05/22   Parrett, Fonnie Mu, NP  loratadine (CLARITIN) 10 MG tablet Take 1 tablet (10 mg total) by mouth daily. 10/22/21   Isaac Bliss, Rayford Halsted, MD  melatonin 5 MG TABS Take 2 tablets (10 mg total) by mouth at bedtime. Patient taking differently: Take 5 mg by mouth at bedtime. 04/01/22   Sherrilyn Rist A, MD  meloxicam (MOBIC) 7.5 MG tablet Take 1 tablet (7.5 mg total) by mouth daily. 07/02/22   Isaac Bliss, Rayford Halsted, MD  metoprolol succinate (TOPROL-XL) 50 MG 24 hr  tablet TAKE ONE TABLET BY MOUTH DAILY WITH OR IMMEDIATELY FOLLOWING A MEAL Patient taking differently: Take 50 mg by mouth daily. TAKE ONE TABLET BY MOUTH DAILY WITH OR IMMEDIATELY FOLLOWING A MEAL 01/01/22   Isaac Bliss, Rayford Halsted, MD  mirtazapine (REMERON) 15 MG tablet TAKE ONE TABLET BY MOUTH AT BEDTIME Patient taking differently: Take 15 mg by mouth at bedtime. 11/28/21   Isaac Bliss, Rayford Halsted, MD  pantoprazole (PROTONIX) 40 MG tablet TAKE ONE TABLET BY MOUTH DAILY 05/07/22   Isaac Bliss, Rayford Halsted, MD  pravastatin (PRAVACHOL) 40 MG tablet Take 1 tablet (40 mg total) by mouth every evening. 02/06/22   Isaac Bliss, Rayford Halsted, MD  predniSONE (DELTASONE) 10 MG tablet Take 4 tablets daily X 2 days,  then, Take 3 tablets daily X 2 days, then, Take 2 tablets daily X 2 days, then, Take 1 tablets daily X 1 day. 06/17/22   Terrilee Croak, MD    Physical Exam: Vitals:   07/05/22 1315 07/05/22 1345 07/05/22 1355 07/05/22 1402  BP: (!) 104/58 118/62  133/68  Pulse: 88 95 93 91  Resp: 16 (!) 24  (!) 27  Temp:      TempSrc:      SpO2: 95% 90% (!) 82% 98%  Weight:      Height:       General: 76 y.o. female resting in bed in NAD Eyes: PERRL, normal sclera ENMT: Nares patent w/o discharge, orophaynx clear, dentition normal, ears w/o discharge/lesions/ulcers Neck: Supple, trachea midline Cardiovascular: tachy, +S1, S2, no m/g/r, equal pulses throughout Respiratory: course, exp wheeze, increased WOB on BiPap GI: BS+, NDNT, no masses noted, no organomegaly noted MSK: No e/c/c Neuro: A&O x 2 (name, place), no focal deficits Psyc: flat affect, calm/cooperative  Data Reviewed:  Results for orders placed or performed during the hospital encounter of 07/05/22 (from the past 24 hour(s))  CBC with Differential     Status: Abnormal   Collection Time: 07/05/22 12:00 PM  Result Value Ref Range   WBC 12.8 (H) 4.0 - 10.5 K/uL   RBC 4.57 3.87 - 5.11 MIL/uL   Hemoglobin 11.2 (L) 12.0 - 15.0 g/dL    HCT 37.5 36.0 - 46.0 %   MCV 82.1 80.0 - 100.0 fL   MCH 24.5 (L) 26.0 - 34.0 pg   MCHC 29.9 (L) 30.0 - 36.0 g/dL   RDW 16.0 (H) 11.5 - 15.5 %   Platelets 378 150 - 400 K/uL   nRBC 0.0 0.0 - 0.2 %   Neutrophils Relative % 82 %   Neutro Abs 10.5 (H) 1.7 - 7.7 K/uL   Lymphocytes Relative 9 %   Lymphs Abs 1.2 0.7 - 4.0 K/uL   Monocytes Relative 7 %   Monocytes Absolute 0.9 0.1 - 1.0 K/uL   Eosinophils Relative 1 %   Eosinophils Absolute 0.2 0.0 - 0.5 K/uL   Basophils Relative 0 %   Basophils Absolute 0.0 0.0 - 0.1 K/uL   Immature Granulocytes 1 %   Abs Immature Granulocytes 0.08 (H) 0.00 - 0.07 K/uL  Blood gas, venous     Status: Abnormal   Collection Time: 07/05/22 12:00 PM  Result Value Ref Range   pH, Ven 7.23 (L) 7.25 - 7.43   pCO2, Ven 92 (HH) 44 - 60 mmHg   pO2, Ven 32 32 - 45 mmHg   Bicarbonate 39.3 (H) 20.0 - 28.0 mmol/L   Acid-Base Excess 7.8 (H) 0.0 - 2.0 mmol/L   O2 Saturation 44.3 %   Patient temperature 36.1   Comprehensive metabolic panel     Status: Abnormal   Collection Time: 07/05/22  1:03 PM  Result Value Ref Range   Sodium 133 (L) 135 - 145 mmol/L   Potassium 4.3 3.5 - 5.1 mmol/L   Chloride 90 (L) 98 - 111 mmol/L   CO2 34 (H) 22 - 32 mmol/L   Glucose, Bld 112 (H) 70 - 99 mg/dL   BUN 18 8 - 23 mg/dL   Creatinine, Ser 0.97 0.44 - 1.00 mg/dL   Calcium 9.0 8.9 - 10.3 mg/dL   Total Protein 7.1 6.5 - 8.1 g/dL   Albumin 2.8 (L) 3.5 - 5.0 g/dL   AST 14 (L) 15 - 41 U/L   ALT 10 0 -  44 U/L   Alkaline Phosphatase 72 38 - 126 U/L   Total Bilirubin 0.5 0.3 - 1.2 mg/dL   GFR, Estimated >60 >60 mL/min   Anion gap 9 5 - 15    CXR: Chronic findings are again noted consistent with history of sarcoidosis. There is interval development of lobular density seen in right midlung which may represent focal inflammation or pneumonia. Radiographic follow-up is recommended to ensure resolution or stability. Also noted is mildly angulated distal left clavicular  fracture.  CTH: 1. Stable.  No acute intracranial abnormality. 2. Advanced chronic small vessel ischemic disease.  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     - admit to inpt, SDU     - CXR concern for PNA, recent hospital admission, will start with vanc, cefepime; check procalcitonin, MRSA, RVP, COVID/flu     - steroids, nebs  Acute metabolic encephalopathy     - secondary to CO2 retention     - continue BiPap and monitor for improvement  HTN     - resume home regimen when confirmed  HLD     - continue home regimen when confirmed  GERD     - continue home regimen when confirmed  Left clavicle fracture     - she was just seen in the ED on 11/23 for a fall that resulted in a comminuted fracture of the distal left clavicle w/ shaft displacement     - her arm is still in a sling     - pain meds may have contributed to her mentation  Advance Care Planning:   Code Status: FULL  Consults: None  Family Communication: w/ daughter by phone  Severity of Illness: The appropriate patient status for this patient is INPATIENT. Inpatient status is judged to be reasonable and necessary in order to provide the required intensity of service to ensure the patient's safety. The patient's presenting symptoms, physical exam findings, and initial radiographic and laboratory data in the context of their chronic comorbidities is felt to place them at high risk for further clinical deterioration. Furthermore, it is not anticipated that the patient will be medically stable for discharge from the hospital within 2 midnights of admission.   * I certify that at the point of admission it is my clinical judgment that the patient will require inpatient hospital care spanning beyond 2 midnights from the point of admission due to high intensity of service, high risk for further deterioration and  high frequency of surveillance required.*  Author: Jonnie Finner, DO 07/05/2022 2:38 PM  For on call review [email or web address].

## 2022-07-06 ENCOUNTER — Encounter (HOSPITAL_COMMUNITY): Payer: Self-pay | Admitting: Internal Medicine

## 2022-07-06 DIAGNOSIS — J9621 Acute and chronic respiratory failure with hypoxia: Secondary | ICD-10-CM | POA: Diagnosis not present

## 2022-07-06 DIAGNOSIS — J9622 Acute and chronic respiratory failure with hypercapnia: Secondary | ICD-10-CM | POA: Diagnosis not present

## 2022-07-06 LAB — CBC
HCT: 30.8 % — ABNORMAL LOW (ref 36.0–46.0)
Hemoglobin: 9.5 g/dL — ABNORMAL LOW (ref 12.0–15.0)
MCH: 24.7 pg — ABNORMAL LOW (ref 26.0–34.0)
MCHC: 30.8 g/dL (ref 30.0–36.0)
MCV: 80 fL (ref 80.0–100.0)
Platelets: 415 10*3/uL — ABNORMAL HIGH (ref 150–400)
RBC: 3.85 MIL/uL — ABNORMAL LOW (ref 3.87–5.11)
RDW: 16.1 % — ABNORMAL HIGH (ref 11.5–15.5)
WBC: 10 10*3/uL (ref 4.0–10.5)
nRBC: 0 % (ref 0.0–0.2)

## 2022-07-06 LAB — COMPREHENSIVE METABOLIC PANEL
ALT: 9 U/L (ref 0–44)
AST: 13 U/L — ABNORMAL LOW (ref 15–41)
Albumin: 2.4 g/dL — ABNORMAL LOW (ref 3.5–5.0)
Alkaline Phosphatase: 76 U/L (ref 38–126)
Anion gap: 13 (ref 5–15)
BUN: 20 mg/dL (ref 8–23)
CO2: 22 mmol/L (ref 22–32)
Calcium: 8.5 mg/dL — ABNORMAL LOW (ref 8.9–10.3)
Chloride: 98 mmol/L (ref 98–111)
Creatinine, Ser: 0.67 mg/dL (ref 0.44–1.00)
GFR, Estimated: >60 mL/min (ref >60)
Glucose, Bld: 85 mg/dL (ref 70–99)
Potassium: 4.7 mmol/L (ref 3.5–5.1)
Sodium: 133 mmol/L — ABNORMAL LOW (ref 135–145)
Total Bilirubin: 0.8 mg/dL (ref 0.3–1.2)
Total Protein: 6.7 g/dL (ref 6.5–8.1)

## 2022-07-06 LAB — STREP PNEUMONIAE URINARY ANTIGEN: Strep Pneumo Urinary Antigen: NEGATIVE

## 2022-07-06 MED ORDER — MELATONIN 5 MG PO TABS
5.0000 mg | ORAL_TABLET | Freq: Every day | ORAL | Status: DC
  Administered 2022-07-06 – 2022-07-13 (×8): 5 mg via ORAL
  Filled 2022-07-06 (×8): qty 1

## 2022-07-06 MED ORDER — FAMOTIDINE 20 MG PO TABS
20.0000 mg | ORAL_TABLET | Freq: Every day | ORAL | Status: DC
  Administered 2022-07-06 – 2022-07-14 (×9): 20 mg via ORAL
  Filled 2022-07-06 (×9): qty 1

## 2022-07-06 MED ORDER — ALBUTEROL SULFATE (2.5 MG/3ML) 0.083% IN NEBU: 2.5000 mg | INHALATION_SOLUTION | Freq: Four times a day (QID) | RESPIRATORY_TRACT | Status: DC | PRN

## 2022-07-06 MED ORDER — METOPROLOL SUCCINATE ER 50 MG PO TB24
50.0000 mg | ORAL_TABLET | Freq: Every day | ORAL | Status: DC
  Administered 2022-07-06 – 2022-07-14 (×9): 50 mg via ORAL
  Filled 2022-07-06 (×4): qty 1
  Filled 2022-07-06: qty 2
  Filled 2022-07-06 (×3): qty 1
  Filled 2022-07-06: qty 2

## 2022-07-06 MED ORDER — ENOXAPARIN SODIUM 40 MG/0.4ML IJ SOSY
40.0000 mg | PREFILLED_SYRINGE | INTRAMUSCULAR | Status: DC
  Administered 2022-07-06 – 2022-07-13 (×8): 40 mg via SUBCUTANEOUS
  Filled 2022-07-06 (×8): qty 0.4

## 2022-07-06 MED ORDER — GABAPENTIN 100 MG PO CAPS
100.0000 mg | ORAL_CAPSULE | Freq: Three times a day (TID) | ORAL | Status: DC
  Administered 2022-07-06 – 2022-07-14 (×25): 100 mg via ORAL
  Filled 2022-07-06 (×25): qty 1

## 2022-07-06 MED ORDER — MOMETASONE FURO-FORMOTEROL FUM 200-5 MCG/ACT IN AERO
2.0000 | INHALATION_SPRAY | Freq: Two times a day (BID) | RESPIRATORY_TRACT | Status: DC
  Administered 2022-07-06 – 2022-07-09 (×7): 2 via RESPIRATORY_TRACT
  Filled 2022-07-06: qty 8.8

## 2022-07-06 NOTE — Progress Notes (Signed)
PROGRESS NOTE    [name]  VZD:[id number] DOB: 1946/02/08 DOA: 07/05/2022 PCP: Isaac Bliss, Rayford Halsted, MD    Brief Narrative:  [name] is a 76 y.o. 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 Presenting with shortness of breath. Hx is from daughter by phone. She says the patient was complaining of arm pain this morning at 3am. The patient was given some pain medicine and she went back to sleep. Around 9:30am, the patient seemed confused. She was mumbling and looking off into space. Family became concerned and called for EMS.    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    -wean off BIPAP to [place] 2-3L, keeping sats closer to 90    -IV steroids weaned to PO    -being treated with IV abx     -breathing treatments   Acute metabolic encephalopathy     - secondary to CO2 retention     - appears to be at baseline   HTN     - resume home BB     GERD     - continue home meds   Left clavicle fracture - she was just seen in the ED on 11/23 for a fall that resulted in a comminuted fracture of the distal left clavicle w/ shaft displacement      - her arm is still in a sling      - pain meds may have contributed to her mentation   DVT prophylaxis: enoxaparin (LOVENOX) injection 40 mg Start: 07/06/22 2200    Code Status: Full Code  Disposition Plan:  Level of care: Stepdown Status is: Inpatient Remains inpatient appropriate because: needs abx    Consultants:  none   Subjective: Feels like she is breathing at her baseline-- has appointment with Dr. Ander Slade on 12/4  Objective: Vitals:   07/06/22 0745 07/06/22 0750 07/06/22 0800 07/06/22 0853  BP:      Pulse: (!) 109 96 (!) 102 (!) 102  Resp: (!) '22 20 16 '$ (!) 23  Temp:   97.7 F (36.5 C)    TempSrc:   Axillary   SpO2: 98% 100% 97% 92%  Weight:      Height:        Intake/Output Summary (Last 24 hours) at 07/06/2022 0914 Last data filed at 07/06/2022 0823 Gross per 24 hour  Intake 2373.02 ml  Output --  Net 2373.02 ml   Filed Weights   07/05/22 1115 07/05/22 1720  Weight: 47.6 kg 46.2 kg    Examination:   General: Appearance:    Thin female in no acute distress     Lungs:     Diminished, no wheezing, on BIPAP, respirations unlabored  Heart:    Tachycardic.   MS:   All extremities are intact.    Neurologic:   Awake, alert       Data Reviewed: I have personally reviewed following labs and imaging studies  CBC: Recent Labs  Lab 07/05/22 1200  WBC 12.8*  NEUTROABS 10.5*  HGB 11.2*  HCT 37.5  MCV 82.1  PLT 295   Basic Metabolic Panel: Recent Labs  Lab 07/05/22 1303 07/06/22 0251  NA 133* 133*  K 4.3 4.7  CL 90* 98  CO2 34* 22  GLUCOSE 112* 85  BUN 18 20  CREATININE 0.97 0.67  CALCIUM 9.0 8.5*   GFR:  Estimated Creatinine Clearance: 43.6 mL/min (by C-G formula based on SCr of 0.67 mg/dL). Liver Function Tests: Recent Labs  Lab 07/05/22 1303 07/06/22 0251  AST 14* 13*  ALT 10 9  ALKPHOS 72 76  BILITOT 0.5 0.8  PROT 7.1 6.7  ALBUMIN 2.8* 2.4*   No results for input(s): "LIPASE", "AMYLASE" in the last 168 hours. No results for input(s): "AMMONIA" in the last 168 hours. Coagulation Profile: No results for input(s): "INR", "PROTIME" in the last 168 hours. Cardiac Enzymes: No results for input(s): "CKTOTAL", "CKMB", "CKMBINDEX", "TROPONINI" in the last 168 hours. BNP (last 3 results) No results for input(s): "PROBNP" in the last 8760 hours. HbA1C: No results for input(s): "HGBA1C" in the last 72 hours. CBG: No results for input(s): "GLUCAP" in the last 168 hours. Lipid Profile: No results for input(s): "CHOL", "HDL", "LDLCALC", "TRIG", "CHOLHDL", "LDLDIRECT" in the last 72 hours. Thyroid Function Tests: No results for input(s):  "TSH", "T4TOTAL", "FREET4", "T3FREE", "THYROIDAB" in the last 72 hours. Anemia Panel: No results for input(s): "VITAMINB12", "FOLATE", "FERRITIN", "TIBC", "IRON", "RETICCTPCT" in the last 72 hours. Sepsis Labs: Recent Labs  Lab 07/05/22 1221  PROCALCITON 0.14    Recent Results (from the past 240 hour(s))  Resp Panel by RT-PCR (Flu A&B, Covid) Anterior Nasal Swab     Status: None   Collection Time: 07/05/22  2:44 PM   Specimen: Anterior Nasal Swab  Result Value Ref Range Status   SARS Coronavirus 2 by RT PCR NEGATIVE NEGATIVE Final    Comment: (NOTE) SARS-CoV-2 target nucleic acids are NOT DETECTED.  The SARS-CoV-2 RNA is generally detectable in upper respiratory specimens during the acute phase of infection. The lowest concentration of SARS-CoV-2 viral copies this assay can detect is 138 copies/mL. A negative result does not preclude SARS-Cov-2 infection and should not be used as the sole basis for treatment or other patient management decisions. A negative result may occur with  improper specimen collection/handling, submission of specimen other than nasopharyngeal swab, presence of viral mutation(s) within the areas targeted by this assay, and inadequate number of viral copies(<138 copies/mL). A negative result must be combined with clinical observations, patient history, and epidemiological information. The expected result is Negative.  Fact Sheet for Patients:  EntrepreneurPulse.com.au  Fact Sheet for Healthcare Providers:  IncredibleEmployment.be  This test is no t yet approved or cleared by the [place] FDA and  has been authorized for detection and/or diagnosis of SARS-CoV-2 by FDA under an Emergency Use Authorization (EUA). This EUA will remain  in effect (meaning this test can be used) for the duration of the COVID-19 declaration under Section 564(b)(1) of the Act, 21 U.S.C.section 360bbb-3(b)(1), unless the authorization is  terminated  or revoked sooner.       Influenza A by PCR NEGATIVE NEGATIVE Final   Influenza B by PCR NEGATIVE NEGATIVE Final    Comment: (NOTE) The Xpert Xpress SARS-CoV-2/FLU/RSV plus assay is intended as an aid in the diagnosis of influenza from Nasopharyngeal swab specimens and should not be used as a sole basis for treatment. Nasal washings and aspirates are unacceptable for Xpert Xpress SARS-CoV-2/FLU/RSV testing.  Fact Sheet for Patients: EntrepreneurPulse.com.au  Fact Sheet for Healthcare Providers: IncredibleEmployment.be  This test is not yet approved or cleared by the [place] FDA and has been authorized for detection and/or diagnosis of SARS-CoV-2 by FDA under an Emergency Use Authorization (EUA). This EUA will remain in effect (meaning this test can be used) for the duration of the COVID-19 declaration under  Section 564(b)(1) of the Act, 21 U.S.C. section 360bbb-3(b)(1), unless the authorization is terminated or revoked.  Performed at [facility], [place] [address], [place], [place] [address]   MRSA Next Gen by PCR, Nasal     Status: None   Collection Time: 07/05/22  5:38 PM   Specimen: Nasal Mucosa; Nasal Swab  Result Value Ref Range Status   MRSA by PCR Next Gen NOT DETECTED NOT DETECTED Final    Comment: (NOTE) The GeneXpert MRSA Assay (FDA approved for NASAL specimens only), is one component of a comprehensive MRSA colonization surveillance program. It is not intended to diagnose MRSA infection nor to guide or monitor treatment for MRSA infections. Test performance is not FDA approved in patients less than 30 years old. Performed at [facility], [place] [address] [place], [place] [address]          Radiology Studies: CT Head Wo Contrast  Result Date: 07/05/2022 CLINICAL DATA:  Delirium.  Altered mental status. EXAM: CT HEAD WITHOUT CONTRAST TECHNIQUE: Contiguous axial  images were obtained from the base of the skull through the vertex without intravenous contrast. RADIATION DOSE REDUCTION: This exam was performed according to the departmental dose-optimization program which includes automated exposure control, adjustment of the mA and/or kV according to patient size and/or use of iterative reconstruction technique. COMPARISON:  07/03/2022 FINDINGS: Brain: There is no evidence for acute hemorrhage, hydrocephalus, mass lesion, or abnormal extra-axial fluid collection. No definite CT evidence for acute infarction. Patchy low attenuation in the deep hemispheric and periventricular white matter is nonspecific, but likely reflects chronic microvascular ischemic demyelination. Vascular: No hyperdense vessel or unexpected calcification. Skull: No evidence for fracture. No worrisome lytic or sclerotic lesion. Sinuses/Orbits: The visualized paranasal sinuses and mastoid air cells are clear. Visualized portions of the globes and intraorbital fat are unremarkable. Other: None. IMPRESSION: 1. Stable.  No acute intracranial abnormality. 2. Advanced chronic small vessel ischemic disease. Electronically Signed   By: Misty Stanley M.D.   On: 07/05/2022 12:59   DG Chest Port 1 View  Result Date: 07/05/2022 CLINICAL DATA:  Shortness of breath. EXAM: PORTABLE CHEST 1 VIEW COMPARISON:  June 14, 2022. FINDINGS: Stable cardiomediastinal silhouette. Continued presence of a large number of calcified mediastinal and hilar lymph nodes consistent with sarcoidosis. Continued presence of bilateral lung opacities are noted consistent with sarcoidosis. However, there is interval development of lobular density seen in the right midlung which may represent focal inflammation or pneumonia. Also noted is mildly angulated distal left clavicular fracture. IMPRESSION: Chronic findings are again noted consistent with history of sarcoidosis. There is interval development of lobular density seen in right midlung  which may represent focal inflammation or pneumonia. Radiographic follow-up is recommended to ensure resolution or stability. Also noted is mildly angulated distal left clavicular fracture. Electronically Signed   By: Marijo Conception M.D.   On: 07/05/2022 11:32        Scheduled Meds:  Chlorhexidine Gluconate Cloth  6 each Topical Daily   enoxaparin (LOVENOX) injection  40 mg Subcutaneous Q24H   ipratropium-albuterol  3 mL Nebulization Q6H   methylPREDNISolone (SOLU-MEDROL) injection  40 mg Intravenous Q12H   Followed by   Derrill Memo ON 07/07/2022] predniSONE  40 mg Oral Q breakfast   mouth rinse  15 mL Mouth Rinse 4 times per day   Continuous Infusions:  ceFEPime (MAXIPIME) IV Stopped (07/06/22 0538)   vancomycin 166.7 mL/hr at 07/06/22 0823     LOS: 1 day  Time spent: 45 minutes spent on chart review, discussion with nursing staff, consultants, updating family and interview/physical exam; more than 50% of that time was spent in counseling and/or coordination of care.    Geradine Girt, DO [facility] Available via Epic secure chat 7am-7pm After these hours, please refer to coverage provider listed on [email or web address] 07/06/2022, 9:14 AM

## 2022-07-06 NOTE — Progress Notes (Signed)
RN took patient off BIPAP and placed her on 2L [place]. Patient appears to be tolerating well at this time with no distress. RT will continue to monitor

## 2022-07-07 DIAGNOSIS — J9622 Acute and chronic respiratory failure with hypercapnia: Secondary | ICD-10-CM | POA: Diagnosis not present

## 2022-07-07 DIAGNOSIS — J9621 Acute and chronic respiratory failure with hypoxia: Secondary | ICD-10-CM | POA: Diagnosis not present

## 2022-07-07 LAB — BASIC METABOLIC PANEL
Anion gap: 7 (ref 5–15)
BUN: 19 mg/dL (ref 8–23)
CO2: 33 mmol/L — ABNORMAL HIGH (ref 22–32)
Calcium: 8.9 mg/dL (ref 8.9–10.3)
Chloride: 95 mmol/L — ABNORMAL LOW (ref 98–111)
Creatinine, Ser: 0.63 mg/dL (ref 0.44–1.00)
GFR, Estimated: >60 mL/min (ref >60)
Glucose, Bld: 144 mg/dL — ABNORMAL HIGH (ref 70–99)
Potassium: 4.2 mmol/L (ref 3.5–5.1)
Sodium: 135 mmol/L (ref 135–145)

## 2022-07-07 LAB — CBC
HCT: 29.5 % — ABNORMAL LOW (ref 36.0–46.0)
Hemoglobin: 9.1 g/dL — ABNORMAL LOW (ref 12.0–15.0)
MCH: 24.1 pg — ABNORMAL LOW (ref 26.0–34.0)
MCHC: 30.8 g/dL (ref 30.0–36.0)
MCV: 78.2 fL — ABNORMAL LOW (ref 80.0–100.0)
Platelets: 346 10*3/uL (ref 150–400)
RBC: 3.77 MIL/uL — ABNORMAL LOW (ref 3.87–5.11)
RDW: 15.8 % — ABNORMAL HIGH (ref 11.5–15.5)
WBC: 9.4 10*3/uL (ref 4.0–10.5)
nRBC: 0 % (ref 0.0–0.2)

## 2022-07-07 LAB — LEGIONELLA PNEUMOPHILA SEROGP 1 UR AG: L. pneumophila Serogp 1 Ur Ag: NEGATIVE

## 2022-07-07 LAB — BRAIN NATRIURETIC PEPTIDE: B Natriuretic Peptide: 431.3 pg/mL — ABNORMAL HIGH (ref 0.0–100.0)

## 2022-07-07 MED ORDER — METOPROLOL TARTRATE 5 MG/5ML IV SOLN: 5.0000 mg | INTRAVENOUS | Status: DC | PRN

## 2022-07-07 MED ORDER — ONDANSETRON HCL 4 MG/2ML IJ SOLN: 4.0000 mg | Freq: Four times a day (QID) | INTRAMUSCULAR | Status: DC | PRN

## 2022-07-07 MED ORDER — FUROSEMIDE 10 MG/ML IJ SOLN
40.0000 mg | Freq: Once | INTRAMUSCULAR | Status: AC
  Administered 2022-07-07: 40 mg via INTRAVENOUS

## 2022-07-07 MED ORDER — AMLODIPINE BESYLATE 5 MG PO TABS
5.0000 mg | ORAL_TABLET | Freq: Every day | ORAL | Status: DC
  Administered 2022-07-07 – 2022-07-11 (×5): 5 mg via ORAL
  Filled 2022-07-07 (×5): qty 1

## 2022-07-07 MED ORDER — PANTOPRAZOLE SODIUM 40 MG PO TBEC
40.0000 mg | DELAYED_RELEASE_TABLET | Freq: Every day | ORAL | Status: DC
  Administered 2022-07-07 – 2022-07-14 (×8): 40 mg via ORAL
  Filled 2022-07-07 (×8): qty 1

## 2022-07-07 MED ORDER — PRAVASTATIN SODIUM 40 MG PO TABS
40.0000 mg | ORAL_TABLET | Freq: Every evening | ORAL | Status: DC
  Administered 2022-07-07 – 2022-07-13 (×7): 40 mg via ORAL
  Filled 2022-07-07 (×7): qty 1

## 2022-07-07 MED ORDER — GUAIFENESIN 100 MG/5ML PO LIQD
5.0000 mL | ORAL | Status: DC | PRN
  Administered 2022-07-11: 5 mL via ORAL

## 2022-07-07 MED ORDER — TRAZODONE HCL 50 MG PO TABS: 50.0000 mg | ORAL_TABLET | Freq: Every evening | ORAL | Status: DC | PRN

## 2022-07-07 MED ORDER — IPRATROPIUM-ALBUTEROL 0.5-2.5 (3) MG/3ML IN SOLN: 3.0000 mL | RESPIRATORY_TRACT | Status: DC | PRN

## 2022-07-07 MED ORDER — HYDRALAZINE HCL 20 MG/ML IJ SOLN: 10.0000 mg | INTRAMUSCULAR | Status: DC | PRN

## 2022-07-07 MED ORDER — SENNOSIDES-DOCUSATE SODIUM 8.6-50 MG PO TABS
1.0000 | ORAL_TABLET | Freq: Every evening | ORAL | Status: DC | PRN
  Administered 2022-07-13 – 2022-07-14 (×2): 1 via ORAL
  Filled 2022-07-07 (×2): qty 1

## 2022-07-07 NOTE — Evaluation (Signed)
Occupational Therapy Evaluation Patient Details Name: [name] MRN: [id number] DOB: 15-Aug-1945 Today's Date: 07/07/2022   History of Present Illness Patient is a 76 year old female who presented with shortness of breath and AMS. Patient was recently in ED after fall on 11/23 with clavicle fx with displacement.  Patient was admitted with pneumonia, COPD exacerbation, acute metabolic encephalopathy. PMH: COPD, chronic hypoxic respiratory failure on 2L at home, pulmonary fibrosis   Clinical Impression   Patient is a 76 year old female who was admitted for above. Patient was living at home with daughter who works during the day per patient report. No family in room. Patient was noted to have decreased functional activity tolernace, LU restrictions on ROM and WB decreased endurance, decreased sitting balance, decreased standing balanced, decreased safety awareness, and decreased knowledge of AE/AD impacting participation in ADLs.        Recommendations for follow up therapy are one component of a multi-disciplinary discharge planning process, led by the attending physician.  Recommendations may be updated based on patient status, additional functional criteria and insurance authorization.   Follow Up Recommendations  Skilled nursing-short term rehab (<3 hours/day) (pending level of support at home)     Assistance Recommended at Discharge Frequent or constant Supervision/Assistance  Patient can return home with the following A little help with walking and/or transfers;A lot of help with bathing/dressing/bathroom;Assistance with cooking/housework;Direct supervision/assist for medications management;Assist for transportation;Help with stairs or ramp for entrance;Direct supervision/assist for financial management    Functional Status Assessment  Patient has had a recent decline in their functional status and demonstrates the ability to make significant improvements in function in a  reasonable and predictable amount of time.  Equipment Recommendations  None recommended by OT    Recommendations for Other Services       Precautions / Restrictions Precautions Precautions: Fall Precaution Comments: monitor sats and HR, L clavicle fx. Required Braces or Orthoses: Sling Restrictions Weight Bearing Restrictions: Yes LUE Weight Bearing: Non weight bearing Other Position/Activity Restrictions: in sling      Mobility Bed Mobility Overal bed mobility: Needs Assistance Bed Mobility: Supine to Sit     Supine to sit: Min assist     General bed mobility comments: extra time, cues to not use the left UE    Transfers Overall transfer level: Needs assistance Equipment used: 1 person hand held assist Transfers: Sit to/from Stand, Bed to chair/wheelchair/BSC Sit to Stand: Min assist     Step pivot transfers: Mod assist     General transfer comment: 1 HHA to steady to step to  [facility] then to recliner      Balance Overall balance assessment: Needs assistance Sitting-balance support: Single extremity supported Sitting balance-Leahy Scale: Fair     Standing balance support: Single extremity supported, During functional activity, Reliant on assistive device for balance Standing balance-Leahy Scale: Poor                             ADL either performed or assessed with clinical judgement   ADL Overall ADL's : Needs assistance/impaired Eating/Feeding: Supervision/ safety;Sitting Eating/Feeding Details (indicate cue type and reason): in recliner Grooming: Sitting;Minimal assistance   Upper Body Bathing: Sitting;Moderate assistance   Lower Body Bathing: Moderate assistance;Sitting/lateral leans   Upper Body Dressing : Moderate assistance;Sitting Upper Body Dressing Details (indicate cue type and reason): to properly follow precautions. Lower Body Dressing: Sitting/lateral leans;Sit to/from stand;Maximal assistance   Toilet Transfer: Minimal  assistance Toilet Transfer Details (indicate cue type and reason): HHA to transfer to recliner and BSC in room Toileting- Clothing Manipulation and Hygiene: Maximal assistance;Sit to/from stand               Vision Patient Visual Report: No change from baseline       Perception     Praxis      Pertinent Vitals/Pain Pain Assessment Pain Assessment: Faces Faces Pain Scale: Hurts a little bit Pain Location: left shoulder Pain Descriptors / Indicators: Discomfort Pain Intervention(s): Monitored during session, Repositioned     Hand Dominance Right   Extremity/Trunk Assessment Upper Extremity Assessment Upper Extremity Assessment: LUE deficits/detail LUE Deficits / Details: clavicular fracture with sling and NWB recommendation. patient noted to lift up arm even with multimodal cues to keep down. LUE: Unable to fully assess due to immobilization   Lower Extremity Assessment Lower Extremity Assessment: Defer to PT evaluation   Cervical / Trunk Assessment Cervical / Trunk Assessment: Normal   Communication Communication Communication: No difficulties   Cognition Arousal/Alertness: Awake/alert Behavior During Therapy: WFL for tasks assessed/performed Overall Cognitive Status: No family/caregiver present to determine baseline cognitive functioning Area of Impairment: Orientation                 Orientation Level: Time, Person             General Comments: follows directions, noted to have no awareness of restrictions on LUE with patient reporting " i took off the sling"     General Comments       Exercises     Shoulder Instructions      Home Living Family/patient expects to be discharged to:: Private residence Living Arrangements: Children Available Help at Discharge: Family;Available 24 hours/day Type of Home: House Home Access: Stairs to enter [employer] of Steps: 2 Entrance Stairs-Rails: Right;Left Home Layout: One level      Bathroom Shower/Tub: Tub/shower unit;Walk-in shower   Bathroom Toilet: Standard     Home Equipment: Conservation officer, nature (2 wheels);Cane - single point;BSC/3in1;Shower seat   Additional Comments: info from previous encounter      Prior Functioning/Environment Prior Level of Function : Independent/Modified Independent             Mobility Comments: since Left shoulder fracture, has been limited and requires assistance ADLs Comments: independent with ADLs and IADLs (cooks, clothes, etc). info taken from previous hospitalization. patient was noted to have some confusion.        OT Problem List: Decreased strength;Decreased activity tolerance;Impaired UE functional use;Pain;Impaired balance (sitting and/or standing);Decreased coordination;Decreased safety awareness;Decreased knowledge of precautions;Decreased knowledge of use of DME or AE;Cardiopulmonary status limiting activity      OT Treatment/Interventions: Self-care/ADL training;Energy conservation;Therapeutic exercise;DME and/or AE instruction;Neuromuscular education;Therapeutic activities;Patient/family education;Balance training    OT Goals(Current goals can be found in the care plan section) Acute Rehab OT Goals Patient Stated Goal: to go home OT Goal Formulation: With patient Time For Goal Achievement: 07/21/22 Potential to Achieve Goals: Fair  OT Frequency: Min 2X/week    Co-evaluation PT/OT/SLP Co-Evaluation/Treatment: Yes Reason for Co-Treatment: For patient/therapist safety;To address functional/ADL transfers PT goals addressed during session: Mobility/safety with mobility OT goals addressed during session: ADL's and self-care      AM-PAC OT "6 Clicks" Daily Activity     Outcome Measure Help from another person eating meals?: A Little Help from another person taking care of personal grooming?: A Little Help from another person toileting, which includes using toliet, bedpan, or urinal?: A Lot  Help from another  person bathing (including washing, rinsing, drying)?: A Lot Help from another person to put on and taking off regular upper body clothing?: A Little Help from another person to put on and taking off regular lower body clothing?: A Lot 6 Click Score: 15   End of Session Equipment Utilized During Treatment: Gait belt;Other (comment) (sling) Nurse Communication: Mobility status  Activity Tolerance: Patient tolerated treatment well Patient left: in chair;with call bell/phone within reach;with family/visitor present;with chair alarm set  OT Visit Diagnosis: Unsteadiness on feet (R26.81);Other abnormalities of gait and mobility (R26.89);Pain Pain - Right/Left: Left Pain - part of body: Shoulder                Time: 7858-8502 OT Time Calculation (min): 29 min Charges:  OT General Charges $OT Visit: 1 Visit OT Evaluation $OT Eval Moderate Complexity: 1 Mod  Diyana Starrett OTR/L, MS Acute Rehabilitation Department Office# [phone]   Willa Rough 07/07/2022, 1:33 PM

## 2022-07-07 NOTE — Evaluation (Signed)
Physical Therapy Evaluation Patient Details Name: TANEISHA FUSON MRN: [id number] DOB: Nov 19, 1945 Today's Date: 07/07/2022  History of Present Illness  76 y.o. 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 Presenting with shortness of breath, confused,arm pain. recently fell from bed sustained Left clavicle fracture.On Home o2. FOUND TO HAVE  PNA/COPD exacerbation,  Acute on chronic hypercapnic/hypoxic respiratory failure, chronically 2.5 L nasal cannula  Clinical Impression  Pt admitted with above diagnosis.  Pt currently with functional limitations due to the deficits listed below (see PT Problem List). Pt will benefit from skilled PT to increase their independence and safety with mobility to allow discharge to the venue listed below.    The patient  is resting in bed on 4 L [place], SPO2 96%, HR 101. Patient  assisted to sitting and transferred to [facility] then recliner with mod steady assistance.  Noted SPO2 dropped to 70's and RR in 30's, HR 114. Encouraged PLB and slow down RR.  SPO2 returned to 95% after resting in recliner.  RN aware.  Continue PT and assess for DC plan , patient comes from home with family. No family present .      Recommendations for follow up therapy are one component of a multi-disciplinary discharge planning process, led by the attending physician.  Recommendations may be updated based on patient status, additional functional criteria and insurance authorization.  Follow Up Recommendations Home health PT with 24/7 vs. SNF if not       Assistance Recommended at Discharge Frequent or constant Supervision/Assistance  Patient can return home with the following  A little help with walking and/or transfers;A little help with bathing/dressing/bathroom;Assistance with cooking/housework;Assist for transportation;Help with stairs or ramp for entrance    Equipment Recommendations None  recommended by PT  Recommendations for Other Services       Functional Status Assessment Patient has had a recent decline in their functional status and demonstrates the ability to make significant improvements in function in a reasonable and predictable amount of time.     Precautions / Restrictions Precautions Precautions: Fall Precaution Comments: monitor sats and HR, L clavicle fx. Required Braces or Orthoses: Sling Restrictions Weight Bearing Restrictions: Yes LUE Weight Bearing: Non weight bearing Other Position/Activity Restrictions: in sling      Mobility  Bed Mobility Overal bed mobility: Needs Assistance Bed Mobility: Supine to Sit     Supine to sit: Min assist     General bed mobility comments: extra time, cues to not use the left UE    Transfers Overall transfer level: Needs assistance Equipment used: 1 person hand held assist Transfers: Sit to/from Stand, Bed to chair/wheelchair/BSC Sit to Stand: Min assist   Step pivot transfers: Mod assist       General transfer comment: 1 HHA to steady to step to  [facility] then to recliner    Ambulation/Gait                  Stairs            Wheelchair Mobility    Modified Rankin (Stroke Patients Only)       Balance Overall balance assessment: Needs assistance Sitting-balance support: Single extremity supported Sitting balance-Leahy Scale: Fair     Standing balance support: Single extremity supported, During functional activity, Reliant on assistive device for balance Standing balance-Leahy Scale: Poor  Pertinent Vitals/Pain Pain Assessment Pain Assessment: Faces Faces Pain Scale: Hurts a little bit Pain Location: left shoulder Pain Descriptors / Indicators: Discomfort Pain Intervention(s): Monitored during session, Repositioned    Home Living Family/patient expects to be discharged to:: Private residence Living Arrangements:  Children Available Help at Discharge: Family;Available 24 hours/day Type of Home: House Home Access: Stairs to enter Entrance Stairs-Rails: Psychiatric nurse of Steps: 2   Home Layout: One level Home Equipment: Conservation officer, nature (2 wheels);Cane - single point;BSC/3in1;Shower seat Additional Comments: info from previous encounter    Prior Function               Mobility Comments: since Left shoulder fracture, has been limited and requires assistance       Hand Dominance   Dominant Hand: Right    Extremity/Trunk Assessment        Lower Extremity Assessment Lower Extremity Assessment: Generalized weakness    Cervical / Trunk Assessment Cervical / Trunk Assessment: Normal  Communication   Communication: No difficulties  Cognition Arousal/Alertness: Awake/alert Behavior During Therapy: WFL for tasks assessed/performed Overall Cognitive Status: No family/caregiver present to determine baseline cognitive functioning Area of Impairment: Orientation                 Orientation Level: Time             General Comments: follows directions,        General Comments      Exercises     Assessment/Plan    PT Assessment Patient needs continued PT services  PT Problem List Decreased safety awareness;Decreased mobility;Decreased activity tolerance;Decreased knowledge of precautions;Cardiopulmonary status limiting activity;Decreased knowledge of use of DME;Decreased balance       PT Treatment Interventions DME instruction;Therapeutic activities;Gait training;Functional mobility training;Therapeutic exercise;Patient/family education;Balance training    PT Goals (Current goals can be found in the Care Plan section)  Acute Rehab PT Goals Patient Stated Goal: agreed to getting in recliner PT Goal Formulation: Patient unable to participate in goal setting Time For Goal Achievement: 07/21/22 Potential to Achieve Goals: Fair    Frequency Min  3X/week     Co-evaluation PT/OT/SLP Co-Evaluation/Treatment: Yes Reason for Co-Treatment: For patient/therapist safety;To address functional/ADL transfers PT goals addressed during session: Mobility/safety with mobility OT goals addressed during session: ADL's and self-care       AM-PAC PT "6 Clicks" Mobility  Outcome Measure Help needed turning from your back to your side while in a flat bed without using bedrails?: A Little Help needed moving from lying on your back to sitting on the side of a flat bed without using bedrails?: A Little Help needed moving to and from a bed to a chair (including a wheelchair)?: A Lot Help needed standing up from a chair using your arms (e.g., wheelchair or bedside chair)?: A Lot Help needed to walk in hospital room?: Total Help needed climbing 3-5 steps with a railing? : Total 6 Click Score: 12    End of Session Equipment Utilized During Treatment: Oxygen Activity Tolerance: Treatment limited secondary to medical complications (Comment) Patient left: in chair;with call bell/phone within reach;with chair alarm set;with family/visitor present Nurse Communication: Mobility status (SOB/desats) PT Visit Diagnosis: Unsteadiness on feet (R26.81);History of falling (Z91.81);Difficulty in walking, not elsewhere classified (R26.2)    Time: 6295-2841 PT Time Calculation (min) (ACUTE ONLY): 26 min   Charges:   PT Evaluation $PT Eval Low Complexity: [place] Office  [phone] Weekend Section, Idara [place]side Elizabeth 07/07/2022, 12:48 PM

## 2022-07-07 NOTE — Progress Notes (Signed)
PROGRESS NOTE    [name]  BSJ:[id number] DOB: 07/26/1946 DOA: 07/05/2022 PCP: Isaac Bliss, Rayford Halsted, MD   Brief Narrative:   76 y.o. 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 Presenting with shortness of breath. Hx is from daughter by phone. She says the patient was complaining of arm pain this morning at 3am. The patient was given some pain medicine and she went back to sleep. Around 9:30am, the patient seemed confused. She was mumbling and looking off into space. Family became concerned and called for EMS.     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  Procalcitonin 0.14. -Scheduled and as needed bronchodilators.  Dulera.  Prednisone p.o. - Empiric IV cefepime.  MRSA screen negative, flu, COVID-negative -Has elevated BNP, will give 1 dose of IV Lasix   Acute metabolic encephalopathy -Continue to monitor   HTN -On Toprol-XL.  Resume Norvasc IV as needed ordered     GERD -Pepcid and PPI   Left clavicle fracture -Seen in ED on 11/23 for this after a fall.  Arm remains in sling.  Pain medication, bowel regimen and outpatient follow-up  Hyperlipidemia - Statin      DVT prophylaxis: enoxaparin (LOVENOX) injection 40 mg Start: 07/06/22 2200     Code Status: Full Code   Disposition Plan:  Level of care: Stepdown Status is: Inpatient Remains inpatient appropriate  because: needs abx, wean off BiPAP.  Will transfer her out of progressive care today     Subjective: Seen and examined at bedside, feeling better this morning.  Tells me her shortness of breath is improved.   Examination:  General exam: Appears calm and comfortable, 2 L nasal cannula.  Elderly frail Respiratory system: Bibasilar crackles Cardiovascular system: S1 & S2 heard, RRR. No JVD, murmurs, rubs, gallops or clicks. No pedal edema. Gastrointestinal system: Abdomen is nondistended, soft and nontender. No organomegaly or masses felt. Normal bowel sounds heard. Central nervous system: Alert and oriented. No focal neurological deficits. Extremities: Symmetric 5 x 5 power. Skin: No rashes, lesions or ulcers Psychiatry: Judgement and insight appear normal. Mood & affect appropriate.     Objective: Vitals:   07/07/22 0345 07/07/22 0400 07/07/22 0419 07/07/22 0600  BP: 130/67 (!) 143/62  (!) 173/80  Pulse: 84 81  88  Resp: '17 20  18  '$ Temp:  97.9 F (36.6 C) 97.9 F (36.6 C)   TempSrc:  Axillary Axillary   SpO2: 99% 95%  100%  Weight:      Height:        Intake/Output Summary (Last 24 hours) at 07/07/2022 0733 Last data filed at 07/07/2022 0500 Gross per 24 hour  Intake 281.98 ml  Output 650 ml  Net -368.02 ml   Filed Weights   07/05/22 1115 07/05/22 1720  Weight: 47.6 kg 46.2 kg     Data Reviewed:   CBC: Recent Labs  Lab 07/05/22 1200 07/06/22 1852 07/07/22 0252  WBC  12.8* 10.0 9.4  NEUTROABS 10.5*  --   --   HGB 11.2* 9.5* 9.1*  HCT 37.5 30.8* 29.5*  MCV 82.1 80.0 78.2*  PLT 378 415* 916   Basic Metabolic Panel: Recent Labs  Lab 07/05/22 1303 07/06/22 0251 07/07/22 0252  NA 133* 133* 135  K 4.3 4.7 4.2  CL 90* 98 95*  CO2 34* 22 33*  GLUCOSE 112* 85 144*  BUN '18 20 19  '$ CREATININE 0.97 0.67 0.63  CALCIUM 9.0 8.5* 8.9   GFR: Estimated Creatinine Clearance: 43.6 mL/min (by C-G formula based on SCr of 0.63 mg/dL). Liver Function  Tests: Recent Labs  Lab 07/05/22 1303 07/06/22 0251  AST 14* 13*  ALT 10 9  ALKPHOS 72 76  BILITOT 0.5 0.8  PROT 7.1 6.7  ALBUMIN 2.8* 2.4*   No results for input(s): "LIPASE", "AMYLASE" in the last 168 hours. No results for input(s): "AMMONIA" in the last 168 hours. Coagulation Profile: No results for input(s): "INR", "PROTIME" in the last 168 hours. Cardiac Enzymes: No results for input(s): "CKTOTAL", "CKMB", "CKMBINDEX", "TROPONINI" in the last 168 hours. BNP (last 3 results) No results for input(s): "PROBNP" in the last 8760 hours. HbA1C: No results for input(s): "HGBA1C" in the last 72 hours. CBG: No results for input(s): "GLUCAP" in the last 168 hours. Lipid Profile: No results for input(s): "CHOL", "HDL", "LDLCALC", "TRIG", "CHOLHDL", "LDLDIRECT" in the last 72 hours. Thyroid Function Tests: No results for input(s): "TSH", "T4TOTAL", "FREET4", "T3FREE", "THYROIDAB" in the last 72 hours. Anemia Panel: No results for input(s): "VITAMINB12", "FOLATE", "FERRITIN", "TIBC", "IRON", "RETICCTPCT" in the last 72 hours. Sepsis Labs: Recent Labs  Lab 07/05/22 1221  PROCALCITON 0.14    Recent Results (from the past 240 hour(s))  Resp Panel by RT-PCR (Flu A&B, Covid) Anterior Nasal Swab     Status: None   Collection Time: 07/05/22  2:44 PM   Specimen: Anterior Nasal Swab  Result Value Ref Range Status   SARS Coronavirus 2 by RT PCR NEGATIVE NEGATIVE Final    Comment: (NOTE) SARS-CoV-2 target nucleic acids are NOT DETECTED.  The SARS-CoV-2 RNA is generally detectable in upper respiratory specimens during the acute phase of infection. The lowest concentration of SARS-CoV-2 viral copies this assay can detect is 138 copies/mL. A negative result does not preclude SARS-Cov-2 infection and should not be used as the sole basis for treatment or other patient management decisions. A negative result may occur with  improper specimen collection/handling, submission of specimen  other than nasopharyngeal swab, presence of viral mutation(s) within the areas targeted by this assay, and inadequate number of viral copies(<138 copies/mL). A negative result must be combined with clinical observations, patient history, and epidemiological information. The expected result is Negative.  Fact Sheet for Patients:  EntrepreneurPulse.com.au  Fact Sheet for Healthcare Providers:  IncredibleEmployment.be  This test is no t yet approved or cleared by the [place] FDA and  has been authorized for detection and/or diagnosis of SARS-CoV-2 by FDA under an Emergency Use Authorization (EUA). This EUA will remain  in effect (meaning this test can be used) for the duration of the COVID-19 declaration under Section 564(b)(1) of the Act, 21 U.S.C.section 360bbb-3(b)(1), unless the authorization is terminated  or revoked sooner.       Influenza A by PCR NEGATIVE NEGATIVE Final   Influenza B by PCR NEGATIVE NEGATIVE Final    Comment: (NOTE) The Xpert Xpress SARS-CoV-2/FLU/RSV plus assay is intended as an aid in the diagnosis of influenza from  Nasopharyngeal swab specimens and should not be used as a sole basis for treatment. Nasal washings and aspirates are unacceptable for Xpert Xpress SARS-CoV-2/FLU/RSV testing.  Fact Sheet for Patients: EntrepreneurPulse.com.au  Fact Sheet for Healthcare Providers: IncredibleEmployment.be  This test is not yet approved or cleared by the [place] FDA and has been authorized for detection and/or diagnosis of SARS-CoV-2 by FDA under an Emergency Use Authorization (EUA). This EUA will remain in effect (meaning this test can be used) for the duration of the COVID-19 declaration under Section 564(b)(1) of the Act, 21 U.S.C. section 360bbb-3(b)(1), unless the authorization is terminated or revoked.  Performed at [facility], [place] [address], [place], [place] [address]   MRSA Next Gen by PCR, Nasal     Status: None   Collection Time: 07/05/22  5:38 PM   Specimen: Nasal Mucosa; Nasal Swab  Result Value Ref Range Status   MRSA by PCR Next Gen NOT DETECTED NOT DETECTED Final    Comment: (NOTE) The GeneXpert MRSA Assay (FDA approved for NASAL specimens only), is one component of a comprehensive MRSA colonization surveillance program. It is not intended to diagnose MRSA infection nor to guide or monitor treatment for MRSA infections. Test performance is not FDA approved in patients less than 58 years old. Performed at [facility], [place] [address], [place], [place] [address]          Radiology Studies: CT Head Wo Contrast  Result Date: 07/05/2022 CLINICAL DATA:  Delirium.  Altered mental status. EXAM: CT HEAD WITHOUT CONTRAST TECHNIQUE: Contiguous axial images were obtained from the base of the skull through the vertex without intravenous contrast. RADIATION DOSE REDUCTION: This exam was performed according to the departmental dose-optimization program which includes automated exposure control, adjustment of the mA and/or kV according to patient size and/or use of iterative reconstruction technique. COMPARISON:  07/03/2022 FINDINGS: Brain: There is no evidence for acute hemorrhage, hydrocephalus, mass lesion, or abnormal extra-axial fluid collection. No definite CT evidence for acute infarction. Patchy low attenuation in the deep hemispheric and periventricular white matter is nonspecific, but likely reflects chronic microvascular ischemic demyelination. Vascular: No hyperdense vessel or unexpected calcification. Skull: No evidence for fracture. No worrisome lytic or sclerotic lesion. Sinuses/Orbits: The visualized paranasal sinuses and mastoid air cells are clear. Visualized portions of the globes and intraorbital fat are unremarkable. Other: None. IMPRESSION: 1. Stable.  No acute intracranial abnormality. 2.  Advanced chronic small vessel ischemic disease. Electronically Signed   By: Misty Stanley M.D.   On: 07/05/2022 12:59   DG Chest Port 1 View  Result Date: 07/05/2022 CLINICAL DATA:  Shortness of breath. EXAM: PORTABLE CHEST 1 VIEW COMPARISON:  June 14, 2022. FINDINGS: Stable cardiomediastinal silhouette. Continued presence of a large number of calcified mediastinal and hilar lymph nodes consistent with sarcoidosis. Continued presence of bilateral lung opacities are noted consistent with sarcoidosis. However, there is interval development of lobular density seen in the right midlung which may represent focal inflammation or pneumonia. Also noted is mildly angulated distal left clavicular fracture. IMPRESSION: Chronic findings are again noted consistent with history of sarcoidosis. There is interval development of lobular density seen in right midlung which may represent focal inflammation or pneumonia. Radiographic follow-up is recommended to ensure resolution or stability. Also noted is mildly angulated distal left clavicular fracture. Electronically Signed   By: Marijo Conception M.D.   On: 07/05/2022 11:32        Scheduled Meds:  Chlorhexidine Gluconate Cloth  6 each Topical Daily   enoxaparin (LOVENOX) injection  40 mg Subcutaneous Q24H   famotidine  20 mg Oral Daily   gabapentin  100 mg Oral TID   ipratropium-albuterol  3 mL Nebulization Q6H   melatonin  5 mg Oral QHS   metoprolol succinate  50 mg Oral Daily   mometasone-formoterol  2 puff Inhalation BID   mouth rinse  15 mL Mouth Rinse 4 times per day   predniSONE  40 mg Oral Q breakfast   Continuous Infusions:  ceFEPime (MAXIPIME) IV Stopped (07/07/22 0338)     LOS: 2 days   Time spent= 35 mins    Donica Derouin Arsenio Loader, MD [facility]  If 7PM-7AM, please contact night-coverage  07/07/2022, 7:33 AM

## 2022-07-08 DIAGNOSIS — J9622 Acute and chronic respiratory failure with hypercapnia: Secondary | ICD-10-CM | POA: Diagnosis not present

## 2022-07-08 DIAGNOSIS — J9621 Acute and chronic respiratory failure with hypoxia: Secondary | ICD-10-CM | POA: Diagnosis not present

## 2022-07-08 LAB — BASIC METABOLIC PANEL
Anion gap: 8 (ref 5–15)
BUN: 14 mg/dL (ref 8–23)
CO2: 33 mmol/L — ABNORMAL HIGH (ref 22–32)
Calcium: 8.8 mg/dL — ABNORMAL LOW (ref 8.9–10.3)
Chloride: 97 mmol/L — ABNORMAL LOW (ref 98–111)
Creatinine, Ser: 0.68 mg/dL (ref 0.44–1.00)
GFR, Estimated: >60 mL/min (ref >60)
Glucose, Bld: 93 mg/dL (ref 70–99)
Potassium: 3.4 mmol/L — ABNORMAL LOW (ref 3.5–5.1)
Sodium: 138 mmol/L (ref 135–145)

## 2022-07-08 LAB — CBC
HCT: 30.6 % — ABNORMAL LOW (ref 36.0–46.0)
Hemoglobin: 9.3 g/dL — ABNORMAL LOW (ref 12.0–15.0)
MCH: 24.3 pg — ABNORMAL LOW (ref 26.0–34.0)
MCHC: 30.4 g/dL (ref 30.0–36.0)
MCV: 79.9 fL — ABNORMAL LOW (ref 80.0–100.0)
Platelets: 343 10*3/uL (ref 150–400)
RBC: 3.83 MIL/uL — ABNORMAL LOW (ref 3.87–5.11)
RDW: 16 % — ABNORMAL HIGH (ref 11.5–15.5)
WBC: 8.6 10*3/uL (ref 4.0–10.5)
nRBC: 0 % (ref 0.0–0.2)

## 2022-07-08 LAB — MAGNESIUM: Magnesium: 2.2 mg/dL (ref 1.7–2.4)

## 2022-07-08 MED ORDER — POTASSIUM CHLORIDE CRYS ER 20 MEQ PO TBCR
40.0000 meq | EXTENDED_RELEASE_TABLET | Freq: Once | ORAL | Status: AC
  Administered 2022-07-08: 40 meq via ORAL
  Filled 2022-07-08: qty 2

## 2022-07-08 NOTE — TOC Initial Note (Signed)
Transition of Care [facility]) - Initial/Assessment Note    Patient Details  Name: [name] MRN: [id number] Date of Birth: 12-15-1945  Transition of [facility]) CM/SW Contact:    Vassie Moselle, LCSW Phone Number: 07/08/2022, 10:22 AM  Clinical Narrative:                 Met with pt in room to discuss discharge plans. Pt shares she lives at home with her youngest daughter and her grandchildren. She does not have 24/7 care at home but, states she will not go to SNF. Pt shares she has been receiving HH 2x a week and is agreeable to continue home health.  CSW spoke with pt's daughter, [name] via t/c to discuss discharge plans. Pt's daughter states that pt refuses SNF and plan will be for her to return home at discharge. Pt currently receives HHPT/OT/RN through [facility] and services have been confirmed. ROC orders will need to be placed prior to discharge.  Pt's daughter shares that [facility] has discussed hospice for this pt and have referred her to Hospice through their agency. Pt's daughter is currently awaiting to hear from [facility] for intake. Pt has hospital bed, RW, BSC, shower seat, and home O2. Pt's daughter shares that pt was to have manual wheelchair ordered by [facility] and has been waiting for it to be delivered. Pt's daughter is interested in obtaining wheelchair while pt is in hospital but, is unsure what agency wheelchair was ordered through and if insurance has already been billed for the wheelchair.   Expected Discharge Plan: [place] Barriers to Discharge: No Barriers Identified   Patient Goals and CMS Choice Patient states their goals for this hospitalization and ongoing recovery are:: To go home CMS Medicare.gov Compare Post Acute Care list provided to:: Patient Choice offered to / list presented to : Patient, Adult Children  Expected Discharge Plan and Services Expected Discharge Plan: [place] In-house Referral:  NA Discharge Planning Services: CM Consult Post Acute Care Choice: Durable Medical Equipment, Home Health Living arrangements for the past 2 months: Single Family Home                           HH Arranged: PT, OT, RN [facility] Agency: [facility] Date [place]: 07/08/22 Time HH Agency Contacted: 21 Representative spoke with at [place]: [place] Arrangements/Services Living arrangements for the past 2 months: [place] Lives with:: Adult Children Patient language and need for interpreter reviewed:: Yes Do you feel safe going back to the place where you live?: Yes      Need for Family Participation in Patient Care: No (Comment) Care giver support system in place?: Yes (comment)   Criminal Activity/Legal Involvement Pertinent to Current Situation/Hospitalization: No - Comment as needed  Activities of Daily Living Home Assistive Devices/Equipment: Cane (specify quad or straight) ADL Screening (condition at time of admission) Patient's cognitive ability adequate to safely complete daily activities?: Yes Is the patient deaf or have difficulty hearing?: No Does the patient have difficulty seeing, even when wearing glasses/contacts?: No Does the patient have difficulty concentrating, remembering, or making decisions?: No Patient able to express need for assistance with ADLs?: Yes Does the patient have difficulty dressing or bathing?: No Independently performs ADLs?: Yes (appropriate for developmental age) Communication: Independent Dressing (OT): Independent Grooming: Needs assistance Is this a change from baseline?: Pre-admission baseline Feeding: Independent Bathing:  Needs assistance Is this a change from baseline?: Pre-admission baseline Toileting: Independent Is this a change from baseline?: Pre-admission baseline In/Out Bed: Independent Walks in Home: Independent with device (comment) Does the patient have difficulty walking or climbing  stairs?: No Weakness of Legs: None Weakness of Arms/Hands: None  Permission Sought/Granted Permission sought to share information with : Case Manager, Family Supports Permission granted to share information with : Yes, Verbal Permission Granted  Share Information with NAME: [place] granted to share info w Relationship: Daughter  Permission granted to share info w Contact Information: [phone]  Emotional Assessment Appearance:: Appears older than stated age Attitude/Demeanor/Rapport: Engaged, Self-Confident Affect (typically observed): Pleasant, Hopeful Orientation: : Oriented to Self, Oriented to  Time, Oriented to Place, Oriented to Situation Alcohol / Substance Use: Not Applicable Psych Involvement: No (comment)  Admission diagnosis:  Acute respiratory failure with hypercapnia (HCC) [J96.02] Acute on chronic respiratory failure with hypoxia and hypercapnia (HCC) [S01.09, J96.22] Patient Active Problem List   Diagnosis Date Noted   Acute on chronic respiratory failure with hypoxia and hypercapnia ([place]) 07/05/2022   Clavicle fracture 07/05/2022   Acute on chronic respiratory failure with hypercapnia ([place]) 06/14/2022   Paroxysmal SVT (supraventricular tachycardia) 04/16/2022   Sepsis without end organ damage 04/14/2022   Hyperkalemia 04/14/2022   Pulmonary fibrosis ([place]) 04/14/2022   Protein-calorie malnutrition, moderate ([place]) 04/14/2022   Alcohol use 04/14/2022   Lower extremity weakness 04/14/2022   Dyspnea 10/24/2021   Head trauma 10/24/2021   Vitamin B12 deficiency 10/22/2021   Posterior dislocation of right hip ([place]) 04/08/2021   Genetic testing 09/04/2020   Family history of breast cancer    Chronic respiratory failure with hypoxia ([place]) 08/28/2019   PNA (pneumonia) 08/06/2019   Acute respiratory failure with hypoxia ([place])    Shock ([place])    Central venous catheter in place    AKI (acute kidney injury) ([place])    Prolonged QT interval     Pulmonary hypertension ([place])    Hyponatremia    Hypoglycemia    Acute metabolic encephalopathy    Severe sepsis ([place]) 05/31/2019   Low bone mass 05/24/2018   IBS (irritable bowel syndrome) 03/01/2018   Diarrhea 12/22/2017   Falls 12/26/2015   Mixed incontinence urge and stress 12/26/2015   Hypokalemia 10/03/2015   Encounter for central line placement 09/12/2014   Dyslipidemia 12/31/2012   Allergic rhinitis 12/31/2012   Osteoarthritis, hip, bilateral 11/01/2010   Unintentional weight loss 03/19/2009   COPD with acute exacerbation ([place]) 02/28/2009   Sarcoidosis 02/05/2009   GERD 01/18/2009   Essential hypertension, benign 06/08/2007   PCP:  Isaac Bliss, Rayford Halsted, MD Pharmacy:   [facility] [place], [address] [place] [address] Phone: [phone] Fax: [phone]  [facility] [place], [place] [address] [place] [address] [place] [address] Phone: [phone] Fax: [place] [address] [place] [address] Phone: [phone] Fax: [phone]     Social Determinants of Health (SDOH) Interventions    Readmission Risk Interventions    07/08/2022   10:19 AM 04/17/2022   10:06 AM  Readmission Risk Prevention Plan  Transportation Screening Complete Complete  PCP or Specialist Appt within 5-7 Days  Complete  PCP or Specialist Appt within 3-5 Days Complete   Home  Care Screening  Complete  Medication Review (RN CM)  Complete  HRI or Home Care Consult Complete   Social Work Consult for [place] Planning/Counseling Complete   Palliative Care Screening Not Applicable   Medication Review Press photographer) Complete

## 2022-07-08 NOTE — Progress Notes (Signed)
Pharmacy Antibiotic Note  [name] is a 76 y.o. female with sarcoidosis, COPD on chronic O2 admitted on 07/05/2022 with HCAP.  Pharmacy has been consulted for Cefepime dosing.  Day #4 Cefepime Afebrile WBC 8.6, improved SCr 0.68, CrCl ~43 ml/min PCT 0.14  Plan: Continue Cefepime 2g IV q12h Continue 5d stop date if clinically improving  Height: '5\' 4"'$  (162.6 cm) Weight: 46.2 kg (101 lb 13.6 oz) IBW/kg (Calculated) : 54.7  Temp (24hrs), Avg:97.8 F (36.6 C), Min:97.6 F (36.4 C), Max:97.9 F (36.6 C)  Recent Labs  Lab 07/05/22 1200 07/05/22 1303 07/06/22 0251 07/06/22 1852 07/07/22 0252 07/08/22 0536  WBC 12.8*  --   --  10.0 9.4 8.6  CREATININE  --  0.97 0.67  --  0.63 0.68     Estimated Creatinine Clearance: 43.6 mL/min (by C-G formula based on SCr of 0.68 mg/dL).    Allergies  Allergen Reactions   Ace Inhibitors Swelling   Lisinopril Swelling    REACTION: Angioedema   Valsartan Swelling    REACTION: angioedema.  Pt should not get ACEI or ARBs of any kind due to angioedema.   Aspirin Other (See Comments)    REACTION: GI ulcer.   Penicillins     Childhood allergy Has patient had a PCN reaction causing immediate rash, facial/tongue/throat swelling, SOB or lightheadedness with hypotension: Unknown Has patient had a PCN reaction causing severe rash involving mucus membranes or skin necrosis: Unknown Has patient had a PCN reaction that required hospitalization: Unknown Has patient had a PCN reaction occurring within the last 10 years: Unknown If all of the above answers are "NO", then may proceed with Cephalosporin use.     Antimicrobials this admission: 11/25 Cefepime >> 11/25 Vancomycin >> 11/26  Dose adjustments this admission: none   Microbiology results: 11/25 Resp panel: negative 11/25 MRSA PCR: note detected 11/25 strep pneumo neg  Thank you for allowing pharmacy to be a part of this patient's care.  Peggyann Juba, PharmD, BCPS [facility] main  pharmacy [phone] 07/08/2022 7:08 AM

## 2022-07-08 NOTE — Progress Notes (Signed)
PROGRESS NOTE    [name]  SHF:[id number] DOB: [birth year] DOA: 07/05/2022 PCP: Isaac Bliss, Rayford Halsted, MD   Brief Narrative:   76 y.o. 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 Presenting with shortness of breath. Hx is from daughter by phone. She says the patient was complaining of arm pain this morning at 3am. The patient was given some pain medicine and she went back to sleep. Around 9:30am, the patient seemed confused. She was mumbling and looking off into space. Family became concerned and called for EMS.     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 Wean off BiPAP, uses 2.5 L nasal cannula at home.  Procalcitonin 0.14. -Scheduled and as needed bronchodilators.  Dulera.  Prednisone p.o. - Empiric IV cefepime.  MRSA screen negative, flu, COVID-negative -gave one time lasix on 11/27.    Acute metabolic encephalopathy -Continue to monitor   HTN -On Toprol-XL.  Resume Norvasc IV as needed ordered     GERD -Pepcid and PPI   Left clavicle fracture -Seen in ED on 11/23 for this after a fall.  Arm remains in sling.  Pain medication, bowel regimen and outpatient follow-up  Hyperlipidemia - Statin      DVT prophylaxis: enoxaparin (LOVENOX) injection 40 mg Start: 07/06/22 2200     Code Status: Full Code   Disposition Plan:  Level of care: Stepdown Status is: Inpatient Remains inpatient  appropriate because: needs abx, wean off BiPAP.  Still lot of rhonchi. Hopefully home in couple of days.      Subjective: Still very congested and coughing when asked to take deep breath. No fever.    Examination:  Constitutional: Not in acute distress; 3L [place] Respiratory: b/l rhonchi Cardiovascular: Normal sinus rhythm, no rubs Abdomen: Nontender nondistended good bowel sounds Musculoskeletal: No edema noted Skin: No rashes seen Neurologic: CN 2-12 grossly intact.  And nonfocal Psychiatric: Normal judgment and insight. Alert and oriented x 3. Normal mood.      Objective: Vitals:   07/07/22 1959 07/08/22 0255 07/08/22 0509 07/08/22 0825  BP:   (!) 147/73   Pulse:   98   Resp:   17   Temp:   97.6 F (36.4 C)   TempSrc:   Oral   SpO2: 93% 100% 100% 94%  Weight:      Height:       No intake or output data in the 24 hours ending 07/08/22 1413  Filed Weights   07/05/22 1115 07/05/22 1720  Weight: 47.6 kg 46.2 kg     Data Reviewed:   CBC: Recent Labs  Lab 07/05/22 1200 07/06/22 1852 07/07/22 0252 07/08/22 0536  WBC 12.8* 10.0 9.4 8.6  NEUTROABS 10.5*  --   --   --   HGB 11.2* 9.5* 9.1* 9.3*  HCT 37.5 30.8* 29.5* 30.6*  MCV 82.1 80.0 78.2* 79.9*  PLT 378 415* 346 502   Basic Metabolic Panel: Recent Labs  Lab 07/05/22 1303 07/06/22 0251 07/07/22 0252  07/08/22 0536  NA 133* 133* 135 138  K 4.3 4.7 4.2 3.4*  CL 90* 98 95* 97*  CO2 34* 22 33* 33*  GLUCOSE 112* 85 144* 93  BUN '18 20 19 14  '$ CREATININE 0.97 0.67 0.63 0.68  CALCIUM 9.0 8.5* 8.9 8.8*  MG  --   --   --  2.2   GFR: Estimated Creatinine Clearance: 43.6 mL/min (by C-G formula based on SCr of 0.68 mg/dL). Liver Function Tests: Recent Labs  Lab 07/05/22 1303 07/06/22 0251  AST 14* 13*  ALT 10 9  ALKPHOS 72 76  BILITOT 0.5 0.8  PROT 7.1 6.7  ALBUMIN 2.8* 2.4*   No results for input(s): "LIPASE", "AMYLASE" in the last 168 hours. No results for input(s): "AMMONIA" in the last 168  hours. Coagulation Profile: No results for input(s): "INR", "PROTIME" in the last 168 hours. Cardiac Enzymes: No results for input(s): "CKTOTAL", "CKMB", "CKMBINDEX", "TROPONINI" in the last 168 hours. BNP (last 3 results) No results for input(s): "PROBNP" in the last 8760 hours. HbA1C: No results for input(s): "HGBA1C" in the last 72 hours. CBG: No results for input(s): "GLUCAP" in the last 168 hours. Lipid Profile: No results for input(s): "CHOL", "HDL", "LDLCALC", "TRIG", "CHOLHDL", "LDLDIRECT" in the last 72 hours. Thyroid Function Tests: No results for input(s): "TSH", "T4TOTAL", "FREET4", "T3FREE", "THYROIDAB" in the last 72 hours. Anemia Panel: No results for input(s): "VITAMINB12", "FOLATE", "FERRITIN", "TIBC", "IRON", "RETICCTPCT" in the last 72 hours. Sepsis Labs: Recent Labs  Lab 07/05/22 1221  PROCALCITON 0.14    Recent Results (from the past 240 hour(s))  Resp Panel by RT-PCR (Flu A&B, Covid) Anterior Nasal Swab     Status: None   Collection Time: 07/05/22  2:44 PM   Specimen: Anterior Nasal Swab  Result Value Ref Range Status   SARS Coronavirus 2 by RT PCR NEGATIVE NEGATIVE Final    Comment: (NOTE) SARS-CoV-2 target nucleic acids are NOT DETECTED.  The SARS-CoV-2 RNA is generally detectable in upper respiratory specimens during the acute phase of infection. The lowest concentration of SARS-CoV-2 viral copies this assay can detect is 138 copies/mL. A negative result does not preclude SARS-Cov-2 infection and should not be used as the sole basis for treatment or other patient management decisions. A negative result may occur with  improper specimen collection/handling, submission of specimen other than nasopharyngeal swab, presence of viral mutation(s) within the areas targeted by this assay, and inadequate number of viral copies(<138 copies/mL). A negative result must be combined with clinical observations, patient history, and epidemiological information. The  expected result is Negative.  Fact Sheet for Patients:  EntrepreneurPulse.com.au  Fact Sheet for Healthcare Providers:  IncredibleEmployment.be  This test is no t yet approved or cleared by the [place] FDA and  has been authorized for detection and/or diagnosis of SARS-CoV-2 by FDA under an Emergency Use Authorization (EUA). This EUA will remain  in effect (meaning this test can be used) for the duration of the COVID-19 declaration under Section 564(b)(1) of the Act, 21 U.S.C.section 360bbb-3(b)(1), unless the authorization is terminated  or revoked sooner.       Influenza A by PCR NEGATIVE NEGATIVE Final   Influenza B by PCR NEGATIVE NEGATIVE Final    Comment: (NOTE) The Xpert Xpress SARS-CoV-2/FLU/RSV plus assay is intended as an aid in the diagnosis of influenza from Nasopharyngeal swab specimens and should not be used as a sole basis for treatment. Nasal washings and aspirates are unacceptable for Xpert Xpress SARS-CoV-2/FLU/RSV testing.  Fact Sheet for Patients: EntrepreneurPulse.com.au  Fact Sheet for Healthcare Providers: IncredibleEmployment.be  This test is not yet approved or cleared by the [place] FDA and has been authorized for detection and/or diagnosis of SARS-CoV-2 by FDA under an Emergency Use Authorization (EUA). This EUA will remain in effect (meaning this test can be used) for the duration of the COVID-19 declaration under Section 564(b)(1) of the Act, 21 U.S.C. section 360bbb-3(b)(1), unless the authorization is terminated or revoked.  Performed at [facility], [place] [address], [place], [place] [address]   MRSA Next Gen by PCR, Nasal     Status: None   Collection Time: 07/05/22  5:38 PM   Specimen: Nasal Mucosa; Nasal Swab  Result Value Ref Range Status   MRSA by PCR Next Gen NOT DETECTED NOT DETECTED Final    Comment: (NOTE) The GeneXpert MRSA  Assay (FDA approved for NASAL specimens only), is one component of a comprehensive MRSA colonization surveillance program. It is not intended to diagnose MRSA infection nor to guide or monitor treatment for MRSA infections. Test performance is not FDA approved in patients less than 53 years old. Performed at [facility], [place] [address], [place], [place] [address]          Radiology Studies: No results found.      Scheduled Meds:  amLODipine  5 mg Oral Daily   Chlorhexidine Gluconate Cloth  6 each Topical Daily   enoxaparin (LOVENOX) injection  40 mg Subcutaneous Q24H   famotidine  20 mg Oral Daily   gabapentin  100 mg Oral TID   ipratropium-albuterol  3 mL Nebulization Q6H   melatonin  5 mg Oral QHS   metoprolol succinate  50 mg Oral Daily   mometasone-formoterol  2 puff Inhalation BID   mouth rinse  15 mL Mouth Rinse 4 times per day   pantoprazole  40 mg Oral Daily   pravastatin  40 mg Oral QPM   predniSONE  40 mg Oral Q breakfast   Continuous Infusions:  ceFEPime (MAXIPIME) IV 2 g (07/08/22 0329)     LOS: 3 days   Time spent= 35 mins    Kardell Virgil Arsenio Loader, MD [facility]  If 7PM-7AM, please contact night-coverage  07/08/2022, 2:13 PM

## 2022-07-08 NOTE — Progress Notes (Signed)
Physical Therapy Treatment Patient Details Name: [name] MRN: [id number] DOB: 04-10-1946 Today's Date: 07/08/2022   History of Present Illness Patient is a 76 year old female who presented with shortness of breath and AMS. Patient was recently in ED after fall on 11/23 with clavicle fx with displacement.  Patient was admitted with pneumonia, COPD exacerbation, acute metabolic encephalopathy. PMH: COPD, chronic hypoxic respiratory failure on 2L at home, pulmonary fibrosis    PT Comments    VERY LIMITED session due to Dyspnea and increased WOB when attempting to sit EOB. Pt in bed on 3 lts with resting HR 102 and sats on 3 lts at low 90's.  RR 35 - 38 prior to activity.  General bed mobility comments: pt only able to partially sit EOB due to increased Hypoxia/WOB with RR increasing to 45.  Sats decreased to low 80" and RR increased to 45.  Returned back to seated upright in bed and called RN to room.  Recommendations for follow up therapy are one component of a multi-disciplinary discharge planning process, led by the attending physician.  Recommendations may be updated based on patient status, additional functional criteria and insurance authorization.  Follow Up Recommendations  Home health PT     Assistance Recommended at Discharge Frequent or constant Supervision/Assistance  Patient can return home with the following A little help with walking and/or transfers;A little help with bathing/dressing/bathroom;Assistance with cooking/housework;Assist for transportation;Help with stairs or ramp for entrance   Equipment Recommendations  None recommended by PT    Recommendations for Other Services       Precautions / Restrictions Precautions Precautions: Fall Precaution Comments: monitor sats and HR, L clavicle fx. Required Braces or Orthoses: Sling Restrictions Weight Bearing Restrictions: Yes LUE Weight Bearing: Non weight bearing Other Position/Activity Restrictions: in  sling     Mobility  Bed Mobility Overal bed mobility: Needs Assistance Bed Mobility: Supine to Sit, Sit to Supine     Supine to sit: Mod assist Sit to supine: Mod assist   General bed mobility comments: pt only able to partially sit EOB due to increased Hypoxia/WOB with RR increasing to 45.  Returned back to seated upright in bed and called RN to room.    Transfers                   General transfer comment: unbale due to increased WOB    Ambulation/Gait                   Stairs             Wheelchair Mobility    Modified Rankin (Stroke Patients Only)       Balance                                            Cognition Arousal/Alertness: Awake/alert Behavior During Therapy: WFL for tasks assessed/performed Overall Cognitive Status: Within Functional Limits for tasks assessed                                 General Comments: AxO x 3 pleasant Lady but feeling "poorly" today.        Exercises      General Comments        Pertinent Vitals/Pain Pain Assessment Pain Assessment: Faces Faces Pain Scale: Hurts  a little bit Pain Location: left shoulder Pain Descriptors / Indicators: Discomfort, Guarding Pain Intervention(s): Monitored during session, Repositioned    Home Living                          Prior Function            PT Goals (current goals can now be found in the care plan section) Progress towards PT goals: Progressing toward goals    Frequency    Min 3X/week      PT Plan Current plan remains appropriate    Co-evaluation              AM-PAC PT "6 Clicks" Mobility   Outcome Measure  Help needed turning from your back to your side while in a flat bed without using bedrails?: A Lot Help needed moving from lying on your back to sitting on the side of a flat bed without using bedrails?: A Lot Help needed moving to and from a bed to a chair (including a  wheelchair)?: A Lot Help needed standing up from a chair using your arms (e.g., wheelchair or bedside chair)?: A Lot Help needed to walk in hospital room?: Total Help needed climbing 3-5 steps with a railing? : Total 6 Click Score: 10    End of Session Equipment Utilized During Treatment: Gait belt;Oxygen Activity Tolerance: Treatment limited secondary to medical complications (Comment) Patient left: in bed;with bed alarm set;with call bell/phone within reach;with nursing/sitter in room Nurse Communication: Mobility status PT Visit Diagnosis: Unsteadiness on feet (R26.81);History of falling (Z91.81);Difficulty in walking, not elsewhere classified (R26.2)     Time: 3729-0211 PT Time Calculation (min) (ACUTE ONLY): 14 min  Charges:  $Therapeutic Activity: 8-22 mins                     Rica Koyanagi  PTA Acute  Rehabilitation Services Office M-F          [phone] Weekend pager [phone]

## 2022-07-08 NOTE — Care Management Important Message (Signed)
Important Message  Patient Details IM Letter given to the Patient Name: [name] MRN: [id number] Date of Birth: [birth year]   Medicare Important Message Given:  Yes     Kerin Salen 07/08/2022, 11:29 AM

## 2022-07-09 DIAGNOSIS — D869 Sarcoidosis, unspecified: Secondary | ICD-10-CM

## 2022-07-09 DIAGNOSIS — J9621 Acute and chronic respiratory failure with hypoxia: Secondary | ICD-10-CM | POA: Diagnosis not present

## 2022-07-09 DIAGNOSIS — J189 Pneumonia, unspecified organism: Secondary | ICD-10-CM

## 2022-07-09 DIAGNOSIS — J441 Chronic obstructive pulmonary disease with (acute) exacerbation: Secondary | ICD-10-CM | POA: Diagnosis not present

## 2022-07-09 DIAGNOSIS — G9341 Metabolic encephalopathy: Secondary | ICD-10-CM | POA: Diagnosis not present

## 2022-07-09 LAB — CBC
HCT: 32.1 % — ABNORMAL LOW (ref 36.0–46.0)
Hemoglobin: 9.6 g/dL — ABNORMAL LOW (ref 12.0–15.0)
MCH: 24.4 pg — ABNORMAL LOW (ref 26.0–34.0)
MCHC: 29.9 g/dL — ABNORMAL LOW (ref 30.0–36.0)
MCV: 81.5 fL (ref 80.0–100.0)
Platelets: 377 10*3/uL (ref 150–400)
RBC: 3.94 MIL/uL (ref 3.87–5.11)
RDW: 15.9 % — ABNORMAL HIGH (ref 11.5–15.5)
WBC: 7.1 10*3/uL (ref 4.0–10.5)
nRBC: 0 % (ref 0.0–0.2)

## 2022-07-09 LAB — BASIC METABOLIC PANEL
Anion gap: 8 (ref 5–15)
BUN: 11 mg/dL (ref 8–23)
CO2: 35 mmol/L — ABNORMAL HIGH (ref 22–32)
Calcium: 8.7 mg/dL — ABNORMAL LOW (ref 8.9–10.3)
Chloride: 93 mmol/L — ABNORMAL LOW (ref 98–111)
Creatinine, Ser: 0.56 mg/dL (ref 0.44–1.00)
GFR, Estimated: >60 mL/min (ref >60)
Glucose, Bld: 83 mg/dL (ref 70–99)
Potassium: 4.2 mmol/L (ref 3.5–5.1)
Sodium: 136 mmol/L (ref 135–145)

## 2022-07-09 LAB — MAGNESIUM: Magnesium: 2 mg/dL (ref 1.7–2.4)

## 2022-07-09 MED ORDER — BUDESONIDE 0.25 MG/2ML IN SUSP
0.2500 mg | Freq: Two times a day (BID) | RESPIRATORY_TRACT | Status: DC
  Administered 2022-07-09 – 2022-07-14 (×11): 0.25 mg via RESPIRATORY_TRACT
  Filled 2022-07-09 (×11): qty 2

## 2022-07-09 MED ORDER — NYSTATIN 100000 UNIT/ML MT SUSP
5.0000 mL | Freq: Four times a day (QID) | OROMUCOSAL | Status: DC
  Administered 2022-07-09 – 2022-07-14 (×18): 500000 [IU] via ORAL
  Filled 2022-07-09 (×19): qty 5

## 2022-07-09 MED ORDER — IPRATROPIUM-ALBUTEROL 0.5-2.5 (3) MG/3ML IN SOLN: 3.0000 mL | Freq: Two times a day (BID) | RESPIRATORY_TRACT | Status: DC

## 2022-07-09 MED ORDER — ARFORMOTEROL TARTRATE 15 MCG/2ML IN NEBU
15.0000 ug | INHALATION_SOLUTION | Freq: Two times a day (BID) | RESPIRATORY_TRACT | Status: DC
  Administered 2022-07-09 – 2022-07-14 (×11): 15 ug via RESPIRATORY_TRACT
  Filled 2022-07-09 (×11): qty 2

## 2022-07-09 MED ORDER — METHYLPREDNISOLONE SODIUM SUCC 125 MG IJ SOLR
60.0000 mg | Freq: Every day | INTRAMUSCULAR | Status: DC
  Administered 2022-07-09 – 2022-07-14 (×6): 60 mg via INTRAVENOUS
  Filled 2022-07-09 (×6): qty 2

## 2022-07-09 MED ORDER — IPRATROPIUM-ALBUTEROL 0.5-2.5 (3) MG/3ML IN SOLN
3.0000 mL | Freq: Four times a day (QID) | RESPIRATORY_TRACT | Status: DC
  Administered 2022-07-09 – 2022-07-10 (×5): 3 mL via RESPIRATORY_TRACT
  Filled 2022-07-09 (×5): qty 3

## 2022-07-09 NOTE — TOC Progression Note (Signed)
Transition of Care [facility]) - Progression Note    Patient Details  Name: [name] MRN: [id number] Date of Birth: 04-01-1946  Transition of Care [facility]) CM/SW [place], LCSW Phone Number: 07/09/2022, 1:17 PM  Clinical Narrative:    [place] to order wheelchair for pt. W/C will be delivered to pt's room prior to discharge.    Expected Discharge Plan: [place] Barriers to Discharge: No Barriers Identified  Expected Discharge Plan and Services Expected Discharge Plan: [place] In-house Referral: NA Discharge Planning Services: CM Consult Post Acute Care Choice: Durable Medical Equipment, Home Health Living arrangements for the past 2 months: Single Family Home                           HH Arranged: PT, OT, RN [facility] Agency: [facility] Date [place]: 07/08/22 Time [place]: 56 Representative spoke with at [place]: [place] ([place]) Interventions    Readmission Risk Interventions    07/09/2022    1:17 PM 07/08/2022   10:19 AM 04/17/2022   10:06 AM  Readmission Risk Prevention Plan  Transportation Screening Complete Complete Complete  PCP or Specialist Appt within 5-7 Days   Complete  PCP or Specialist Appt within 3-5 Days  Complete   Home Care Screening   Complete  Medication Review (RN CM)   Complete  HRI or Home Care Consult  Complete   Social Work Consult for [place] Planning/Counseling  Complete   Palliative Care Screening  Not Applicable   Medication Review Press photographer)  Complete

## 2022-07-09 NOTE — Progress Notes (Signed)
Occupational Therapy Treatment Patient Details Name: [name] MRN: [id number] DOB: [birth year] Today's Date: 07/09/2022   History of present illness Patient is a 76 year old female who presented with shortness of breath and AMS. Patient was recently in ED after fall on 11/23 with clavicle fx with displacement.  Patient was admitted with pneumonia, COPD exacerbation, acute metabolic encephalopathy. PMH: COPD, chronic hypoxic respiratory failure on 2L at home, pulmonary fibrosis   OT comments  Treatment focused on activity tolerance and then ADL. Patient found on 3 L [place]. Patient mod assist for bed transfers and able to sit edge of bed with o2 sat maintaining above 90%. Prolonged time at edge of bed to work on sitting tolerance.  Had patient use flutter valve prior to activity. With standing for toileting task - to don/doff underwear and manage purewick - patient dropped to 86% on 3  L [place]. Patient able to take steps to head of bed. Overall increased work of breathing but improved from yesterday. Patient reports the plan is for her to go home with [facility] as she refuses SNF.    Recommendations for follow up therapy are one component of a multi-disciplinary discharge planning process, led by the attending physician.  Recommendations may be updated based on patient status, additional functional criteria and insurance authorization.    Follow Up Recommendations  Home health OT     Assistance Recommended at Discharge Frequent or constant Supervision/Assistance  Patient can return home with the following  A little help with walking and/or transfers;A lot of help with bathing/dressing/bathroom;Assistance with cooking/housework;Direct supervision/assist for medications management;Assist for transportation;Help with stairs or ramp for entrance;Direct supervision/assist for financial management   Equipment Recommendations  None recommended by OT    Recommendations for Other Services      Precautions  / Restrictions Precautions Precautions: Fall Precaution Comments: monitor sats and HR, L clavicle fx. Required Braces or Orthoses: Sling Restrictions Weight Bearing Restrictions: Yes LUE Weight Bearing: Non weight bearing       Mobility Bed Mobility Overal bed mobility: Needs Assistance Bed Mobility: Supine to Sit, Sit to Supine     Supine to sit: Mod assist, HOB elevated Sit to supine: HOB elevated, Mod assist   General bed mobility comments: Mod assist to transfer to side of bed bed due to not being able to use LUE. Mod assist for supine to sit for LEs.    Transfers Overall transfer level: Needs assistance Equipment used: 1 person hand held assist   Sit to Stand: Min guard           General transfer comment: Min guard to stand and take steps to [place]il[place]     Balance Overall balance assessment: Mild deficits observed, not formally tested                                         ADL either performed or assessed with clinical judgement   ADL Overall ADL's : Needs assistance/impaired Eating/Feeding: Set up;Bed level Eating/Feeding Details (indicate cue type and reason): patient feeding self when therapist entered the room.                         Toileting- Clothing Manipulation and Hygiene: Sit to/from stand;Maximal assistance Toileting - Clothing Manipulation Details (indicate cue type and reason): for donning/doffing brief due to being wet - use of purewick  Functional mobility during ADLs: Min guard      Extremity/Trunk Assessment Upper Extremity Assessment Upper Extremity Assessment: LUE deficits/detail LUE: Unable to fully assess due to immobilization       Cervical / Trunk Assessment Cervical / Trunk Assessment: Normal    Vision Patient Visual Report: No change from baseline     Perception     Praxis      Cognition Arousal/Alertness: Awake/alert Behavior During Therapy: WFL for tasks assessed/performed Overall  Cognitive Status: Within Functional Limits for tasks assessed                                          Exercises      Shoulder Instructions       General Comments      Pertinent Vitals/ Pain       Pain Assessment Pain Assessment: No/denies pain  Home Living                                          Prior Functioning/Environment              Frequency  Min 2X/week        Progress Toward Goals  OT Goals(current goals can now be found in the care plan section)  Progress towards OT goals: Progressing toward goals  Acute Rehab OT Goals Patient Stated Goal: improve tolerance OT Goal Formulation: With patient Time For Goal Achievement: 07/21/22 Potential to Achieve Goals: [place] Discharge plan needs to be updated    Co-evaluation                 AM-PAC OT "6 Clicks" Daily Activity     Outcome Measure   Help from another person eating meals?: A Little Help from another person taking care of personal grooming?: A Little Help from another person toileting, which includes using toliet, bedpan, or urinal?: A Lot Help from another person bathing (including washing, rinsing, drying)?: A Lot Help from another person to put on and taking off regular upper body clothing?: A Little Help from another person to put on and taking off regular lower body clothing?: A Lot 6 Click Score: 15    End of Session Equipment Utilized During Treatment: Oxygen  OT Visit Diagnosis: Unsteadiness on feet (R26.81);Other abnormalities of gait and mobility (R26.89);Pain   Activity Tolerance Patient limited by fatigue   Patient Left in bed;with call bell/phone within reach   Nurse Communication Mobility status        Time: 2297-9892 OT Time Calculation (min): 26 min  Charges: OT General Charges $OT Visit: 1 Visit OT Treatments $Self Care/Home Management : 8-22 mins $Therapeutic Activity: 8-22 mins  [name], OTR/L [place]  Office [phone]   [name] 07/09/2022, 10:48 AM

## 2022-07-09 NOTE — Progress Notes (Signed)
[facility]                                                                              [name], is a 76 y.o. female, DOB - [birth year], [id number] Admit date - 07/05/2022    Outpatient Primary MD for the patient is Isaac Bliss, Rayford Halsted, MD  LOS - 4  days  Chief Complaint  Patient presents with   Altered Mental Status       Brief summary   76 y.o. 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 Presenting with shortness of breath. Hx is from daughter by phone. She says the patient was complaining of arm pain this morning at 3am. The patient was given some pain medicine and she went back to sleep. Around 9:30am, the patient seemed confused. She was mumbling and looking off into space. Family became concerned and called for EMS.      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 -COVID-19, flu negative -Still wheezing bilaterally, increased frequency of scheduled DuoNebs, added Pulmicort, Brovana, -Placed on IV Solu-Medrol 60 mg daily -Received Lasix x1 on 11/27 for elevated BNP   Acute metabolic encephalopathy - Currently resolved, he is close to her baseline, continue to monitor   HTN - BP stable, continue Toprol-XL, Norvasc 5 mg daily -Continue IV hydralazine as needed with parameters    GERD - Continue PPI  HLP -Continue Pravachol  Prolonged QTc -EKG 11/6 had shown QTc of 479, will recheck EKG   Left clavicle fracture -Seen in ED on 11/23 for this after a fall.  -Left arm remains  in sling.  -Continue pain control, bowel regimen and outpatient follow-up   Underweight Estimated body mass index is 17.48 kg/m as calculated from the following:   Height as of this encounter: '5\' 4"'$  (1.626 m).   Weight as of this encounter: 46.2 kg.  Code Status: Full code DVT Prophylaxis:  enoxaparin (LOVENOX) injection 40 mg Start: 07/06/22 2200   Level of Care: Level of care: Med-Surg Family Communication: None at bedside Disposition Plan:      Remains inpatient appropriate: PT recommended home health PT.  DC in 1 to 2 days once clinically improving, wheezing better   Procedures:  None  Consultants:   None  Antimicrobials:   Anti-infectives (From admission, onward)    Start     Dose/Rate Route Frequency Ordered Stop   07/05/22 1600  vancomycin (VANCOREADY) IVPB 1250 mg/250 mL  Status:  Discontinued        1,250 mg 166.7 mL/hr over 90 Minutes Intravenous Every 48 hours 07/05/22 1525 07/06/22 1109   07/05/22 1600  ceFEPIme (MAXIPIME) 2 g in sodium chloride 0.9 % 100 mL IVPB  2 g 200 mL/hr over 30 Minutes Intravenous Every 12 hours 07/05/22 1525            Medications  amLODipine  5 mg Oral Daily   arformoterol  15 mcg Nebulization BID   budesonide (PULMICORT) nebulizer solution  0.25 mg Nebulization BID   Chlorhexidine Gluconate Cloth  6 each Topical Daily   enoxaparin (LOVENOX) injection  40 mg Subcutaneous Q24H   famotidine  20 mg Oral Daily   gabapentin  100 mg Oral TID   ipratropium-albuterol  3 mL Nebulization Q6H   melatonin  5 mg Oral QHS   methylPREDNISolone (SOLU-MEDROL) injection  60 mg Intravenous Daily   metoprolol succinate  50 mg Oral Daily   mouth rinse  15 mL Mouth Rinse 4 times per day   pantoprazole  40 mg Oral Daily   pravastatin  40 mg Oral QPM      Subjective:   [name] was seen and examined today.  Diffuse wheezing bilaterally, no fevers, nausea vomiting.  No acute events overnight.  No chest pain.  Objective:    Vitals:   07/09/22 0157 07/09/22 0424 07/09/22 0814 07/09/22 0816  BP:  (!) 168/92    Pulse:  95    Resp:  20    Temp:  98.2 F (36.8 C)    TempSrc:      SpO2: 97% 100% 100% 100%  Weight:      Height:        Intake/Output Summary (Last 24 hours) at 07/09/2022 1000 Last data filed at 07/09/2022 3614 Gross per 24 hour  Intake 2307.4 ml  Output 2950 ml  Net -642.6 ml     Wt Readings from Last 3 Encounters:  07/05/22 46.2 kg  07/01/22 47.6 kg  06/15/22 44.4 kg     Exam General: Alert and oriented x 3, NAD Cardiovascular: S1 S2 auscultated,  RRR Respiratory: Bilateral expiratory wheezing Gastrointestinal: Soft, nontender, nondistended, + bowel sounds Ext: no pedal edema bilaterally Neuro: no new deficits Psych: Normal affect and demeanor, alert and oriented x3     Data Reviewed:  I have personally reviewed following labs    CBC Lab Results  Component Value Date   WBC 7.1 07/09/2022   RBC 3.94 07/09/2022   HGB 9.6 (L) 07/09/2022   HCT 32.1 (L) 07/09/2022   MCV 81.5 07/09/2022   MCH 24.4 (L) 07/09/2022   PLT 377 07/09/2022   MCHC 29.9 (L) 07/09/2022   RDW 15.9 (H) 07/09/2022   LYMPHSABS 1.2 07/05/2022   MONOABS 0.9 07/05/2022   EOSABS 0.2 07/05/2022   BASOSABS 0.0 43/15/4008     Last metabolic panel Lab Results  Component Value Date   NA 136 07/09/2022   K 4.2 07/09/2022   CL 93 (L) 07/09/2022   CO2 35 (H) 07/09/2022   BUN 11 07/09/2022   CREATININE 0.56 07/09/2022   GLUCOSE 83 07/09/2022   GFRNONAA >60 07/09/2022   GFRAA >60 08/28/2019   CALCIUM 8.7 (L) 07/09/2022   PHOS 3.2 09/23/2020   PROT 6.7 07/06/2022   ALBUMIN 2.4 (L) 07/06/2022   LABGLOB 4.3 05/24/2018   AGRATIO 0.8 (L) 05/24/2018   BILITOT 0.8 07/06/2022   ALKPHOS 76 07/06/2022   AST 13 (L) 07/06/2022   ALT 9 07/06/2022   ANIONGAP 8 07/09/2022    CBG (last 3)  No results for input(s): "GLUCAP" in the last 72 hours.    Coagulation Profile: No results for input(s):  "INR", "PROTIME" in the last 168 hours.  Radiology Studies: I have personally reviewed the imaging studies  No results found.     Estill Cotta M.D. [facility] 07/09/2022, 10:00 AM  Available via Epic secure chat 7am-7pm After 7 pm, please refer to night coverage provider listed on amion.

## 2022-07-10 DIAGNOSIS — J9621 Acute and chronic respiratory failure with hypoxia: Secondary | ICD-10-CM | POA: Diagnosis not present

## 2022-07-10 DIAGNOSIS — J9602 Acute respiratory failure with hypercapnia: Secondary | ICD-10-CM

## 2022-07-10 DIAGNOSIS — D869 Sarcoidosis, unspecified: Secondary | ICD-10-CM | POA: Diagnosis not present

## 2022-07-10 DIAGNOSIS — J441 Chronic obstructive pulmonary disease with (acute) exacerbation: Secondary | ICD-10-CM | POA: Diagnosis not present

## 2022-07-10 LAB — CBC
HCT: 31.4 % — ABNORMAL LOW (ref 36.0–46.0)
Hemoglobin: 9.5 g/dL — ABNORMAL LOW (ref 12.0–15.0)
MCH: 24.1 pg — ABNORMAL LOW (ref 26.0–34.0)
MCHC: 30.3 g/dL (ref 30.0–36.0)
MCV: 79.7 fL — ABNORMAL LOW (ref 80.0–100.0)
Platelets: 396 10*3/uL (ref 150–400)
RBC: 3.94 MIL/uL (ref 3.87–5.11)
RDW: 16.1 % — ABNORMAL HIGH (ref 11.5–15.5)
WBC: 7.8 10*3/uL (ref 4.0–10.5)
nRBC: 0 % (ref 0.0–0.2)

## 2022-07-10 LAB — BASIC METABOLIC PANEL
Anion gap: 9 (ref 5–15)
BUN: 18 mg/dL (ref 8–23)
CO2: 32 mmol/L (ref 22–32)
Calcium: 8.9 mg/dL (ref 8.9–10.3)
Chloride: 93 mmol/L — ABNORMAL LOW (ref 98–111)
Creatinine, Ser: 0.57 mg/dL (ref 0.44–1.00)
GFR, Estimated: >60 mL/min (ref >60)
Glucose, Bld: 99 mg/dL (ref 70–99)
Potassium: 4 mmol/L (ref 3.5–5.1)
Sodium: 134 mmol/L — ABNORMAL LOW (ref 135–145)

## 2022-07-10 LAB — MAGNESIUM: Magnesium: 2 mg/dL (ref 1.7–2.4)

## 2022-07-10 MED ORDER — IPRATROPIUM-ALBUTEROL 0.5-2.5 (3) MG/3ML IN SOLN
3.0000 mL | Freq: Three times a day (TID) | RESPIRATORY_TRACT | Status: DC
  Administered 2022-07-10 – 2022-07-12 (×5): 3 mL via RESPIRATORY_TRACT
  Filled 2022-07-10 (×5): qty 3

## 2022-07-10 NOTE — Progress Notes (Addendum)
[facility]                                                                              [name], is a 76 y.o. female, DOB - [birth year], [id number] Admit date - 07/05/2022    Outpatient Primary MD for the patient is Isaac Bliss, Rayford Halsted, MD  LOS - 5  days  Chief Complaint  Patient presents with   Altered Mental Status       Brief summary   76 y.o. 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 Presenting with shortness of breath. Hx is from daughter by phone. She says the patient was complaining of arm pain this morning at 3am. The patient was given some pain medicine and she went back to sleep. Around 9:30am, the patient seemed confused. She was mumbling and looking off into space. Family became concerned and called for EMS.      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 -COVID-19, flu negative -Wheezing improving, continue scheduled DuoNebs, Pulmicort, Brovana  -Continue IV Solu-Medrol, will transition to oral prednisone in a.m. -Received Lasix x1 on 11/27 for elevated BNP   Acute metabolic encephalopathy - Currently resolved, he is close to her baseline, continue to monitor   HTN - BP stable, continue Toprol-XL, Norvasc 5 mg daily -Continue IV hydralazine as needed with parameters    GERD - Continue PPI  HLP -Continue Pravachol  Prolonged QTc -EKG 11/6 had shown QTc of 479 -On EKG, 11/29 QTc improved to 438   Left clavicle fracture -Seen in ED on 11/23 for this after a  fall.  -Left arm remains in sling.  -Continue pain control, bowel regimen and outpatient follow-up   Underweight Estimated body mass index is 17.48 kg/m as calculated from the following:   Height as of this encounter: '5\' 4"'$  (1.626 m).   Weight as of this encounter: 46.2 kg.  Code Status: Full code DVT Prophylaxis:  enoxaparin (LOVENOX) injection 40 mg Start: 07/06/22 2200   Level of Care: Level of care: Med-Surg Family Communication: None at bedside Disposition Plan:      Remains inpatient appropriate: PT recommended home health PT. hopefully DC home in a.m. if wheezing is improving.   Procedures:  None  Consultants:   None  Antimicrobials:   Anti-infectives (From admission, onward)    Start     Dose/Rate Route Frequency Ordered Stop   07/05/22 1600  vancomycin (VANCOREADY) IVPB 1250 mg/250 mL  Status:  Discontinued        1,250 mg 166.7 mL/hr over 90 Minutes Intravenous Every 48 hours 07/05/22 1525 07/06/22 1109   07/05/22 1600  ceFEPIme (MAXIPIME) 2 g in sodium chloride 0.9 % 100 mL IVPB  2 g 200 mL/hr over 30 Minutes Intravenous Every 12 hours 07/05/22 1525 07/12/22 1559          Medications  amLODipine  5 mg Oral Daily   arformoterol  15 mcg Nebulization BID   budesonide (PULMICORT) nebulizer solution  0.25 mg Nebulization BID   enoxaparin (LOVENOX) injection  40 mg Subcutaneous Q24H   famotidine  20 mg Oral Daily   gabapentin  100 mg Oral TID   ipratropium-albuterol  3 mL Nebulization Q6H   melatonin  5 mg Oral QHS   methylPREDNISolone (SOLU-MEDROL) injection  60 mg Intravenous Daily   metoprolol succinate  50 mg Oral Daily   nystatin  5 mL Oral QID   mouth rinse  15 mL Mouth Rinse 4 times per day   pantoprazole  40 mg Oral Daily   pravastatin  40 mg Oral QPM      Subjective:   [name] was seen and examined today.  Wheezing is improving, but not yet back to baseline.  No fevers or chills, coughing.  No chest pain.  No nausea vomiting,  abdominal pain.   Objective:   Vitals:   07/10/22 0118 07/10/22 0450 07/10/22 0450 07/10/22 0814  BP:  (!) 154/84 (!) 156/85   Pulse:  95 88   Resp:  20 16   Temp:  97.7 F (36.5 C) 97.7 F (36.5 C)   TempSrc:  Oral    SpO2: 100% 98% 94% 100%  Weight:      Height:        Intake/Output Summary (Last 24 hours) at 07/10/2022 1235 Last data filed at 07/10/2022 0835 Gross per 24 hour  Intake 675 ml  Output 700 ml  Net -25 ml     Wt Readings from Last 3 Encounters:  07/05/22 46.2 kg  07/01/22 47.6 kg  06/15/22 44.4 kg   Physical Exam General: Alert and oriented x 3, NAD Cardiovascular: S1 S2 clear, RRR.  Respiratory: Bilateral expiratory wheeze, improving Gastrointestinal: Soft, nontender, nondistended, NBS Ext: no pedal edema bilaterally Neuro: no new deficits Psych: Normal affect    Data Reviewed:  I have personally reviewed following labs    CBC Lab Results  Component Value Date   WBC 7.8 07/10/2022   RBC 3.94 07/10/2022   HGB 9.5 (L) 07/10/2022   HCT 31.4 (L) 07/10/2022   MCV 79.7 (L) 07/10/2022   MCH 24.1 (L) 07/10/2022   PLT 396 07/10/2022   MCHC 30.3 07/10/2022   RDW 16.1 (H) 07/10/2022   LYMPHSABS 1.2 07/05/2022   MONOABS 0.9 07/05/2022   EOSABS 0.2 07/05/2022   BASOSABS 0.0 16/05/9603     Last metabolic panel Lab Results  Component Value Date   NA 134 (L) 07/10/2022   K 4.0 07/10/2022   CL 93 (L) 07/10/2022   CO2 32 07/10/2022   BUN 18 07/10/2022   CREATININE 0.57 07/10/2022   GLUCOSE 99 07/10/2022   GFRNONAA >60 07/10/2022   GFRAA >60 08/28/2019   CALCIUM 8.9 07/10/2022   PHOS 3.2 09/23/2020   PROT 6.7 07/06/2022   ALBUMIN 2.4 (L) 07/06/2022   LABGLOB 4.3 05/24/2018   AGRATIO 0.8 (L) 05/24/2018   BILITOT 0.8 07/06/2022   ALKPHOS 76 07/06/2022   AST 13 (L) 07/06/2022   ALT 9 07/06/2022   ANIONGAP 9 07/10/2022    CBG (last 3)  No results for input(s): "GLUCAP" in the last 72 hours.    Coagulation Profile: No results for  input(s): "INR", "PROTIME" in the last 168  hours.   Radiology Studies: I have personally reviewed the imaging studies  No results found.     Estill Cotta M.D. [facility] 07/10/2022, 12:35 PM  Available via Epic secure chat 7am-7pm After 7 pm, please refer to night coverage provider listed on amion.

## 2022-07-10 NOTE — Plan of Care (Signed)
  Problem: Education: Goal: Knowledge of disease or condition will improve Outcome: Progressing Goal: Knowledge of the prescribed therapeutic regimen will improve Outcome: Progressing Goal: Individualized Educational Video(s) Outcome: Progressing   Problem: Activity: Goal: Ability to tolerate increased activity will improve Outcome: Progressing Goal: Will verbalize the importance of balancing activity with adequate rest periods Outcome: Progressing   Problem: Respiratory: Goal: Ability to maintain a clear airway will improve Outcome: Progressing Goal: Levels of oxygenation will improve Outcome: Progressing Goal: Ability to maintain adequate ventilation will improve Outcome: Progressing   Problem: Activity: Goal: Ability to tolerate increased activity will improve Outcome: Progressing   Problem: Clinical Measurements: Goal: Ability to maintain a body temperature in the normal range will improve Outcome: Progressing   Problem: Respiratory: Goal: Ability to maintain adequate ventilation will improve Outcome: Progressing Goal: Ability to maintain a clear airway will improve Outcome: Progressing   Problem: Education: Goal: Knowledge of General Education information will improve Description: Including pain rating scale, medication(s)/side effects and non-pharmacologic comfort measures Outcome: Progressing   Problem: Health Behavior/Discharge Planning: Goal: Ability to manage health-related needs will improve Outcome: Progressing   Problem: Clinical Measurements: Goal: Ability to maintain clinical measurements within normal limits will improve Outcome: Progressing Goal: Will remain free from infection Outcome: Progressing Goal: Diagnostic test results will improve Outcome: Progressing Goal: Respiratory complications will improve Outcome: Progressing Goal: Cardiovascular complication will be avoided Outcome: Progressing   Problem: Activity: Goal: Risk for activity  intolerance will decrease Outcome: Progressing   Problem: Nutrition: Goal: Adequate nutrition will be maintained Outcome: Progressing   Problem: Coping: Goal: Level of anxiety will decrease Outcome: Progressing   Problem: Elimination: Goal: Will not experience complications related to bowel motility Outcome: Progressing Goal: Will not experience complications related to urinary retention Outcome: Progressing   Problem: Pain Managment: Goal: General experience of comfort will improve Outcome: Progressing   Problem: Safety: Goal: Ability to remain free from injury will improve Outcome: Progressing   Problem: Skin Integrity: Goal: Risk for impaired skin integrity will decrease Outcome: Progressing   Problem: Education: Goal: Knowledge of General Education information will improve Description: Including pain rating scale, medication(s)/side effects and non-pharmacologic comfort measures Outcome: Progressing   Problem: Health Behavior/Discharge Planning: Goal: Ability to manage health-related needs will improve Outcome: Progressing   Problem: Clinical Measurements: Goal: Ability to maintain clinical measurements within normal limits will improve Outcome: Progressing Goal: Will remain free from infection Outcome: Progressing Goal: Diagnostic test results will improve Outcome: Progressing Goal: Respiratory complications will improve Outcome: Progressing Goal: Cardiovascular complication will be avoided Outcome: Progressing   Problem: Activity: Goal: Risk for activity intolerance will decrease Outcome: Progressing   Problem: Nutrition: Goal: Adequate nutrition will be maintained Outcome: Progressing   Problem: Coping: Goal: Level of anxiety will decrease Outcome: Progressing   Problem: Elimination: Goal: Will not experience complications related to bowel motility Outcome: Progressing Goal: Will not experience complications related to urinary  retention Outcome: Progressing   Problem: Pain Managment: Goal: General experience of comfort will improve Outcome: Progressing   Problem: Safety: Goal: Ability to remain free from injury will improve Outcome: Progressing   Problem: Skin Integrity: Goal: Risk for impaired skin integrity will decrease Outcome: Progressing   

## 2022-07-10 NOTE — Progress Notes (Signed)
PT Cancellation Note  Patient Details Name: [name] MRN: [id number] DOB: 1945-12-23   Cancelled Treatment:    Reason Eval/Treat Not Completed: Medical issues which prohibited therapy;Fatigue/lethargy limiting ability to participate (pt declined PT, she stated she's too SOB at present. Pt noted to have very rapid respiratory rate at rest in bed, SpO2 85% at rest. Will follow.)   Philomena Doheny PT 07/10/2022  Acute Rehabilitation Services  Office [phone]

## 2022-07-11 DIAGNOSIS — J8410 Pulmonary fibrosis, unspecified: Secondary | ICD-10-CM

## 2022-07-11 LAB — BASIC METABOLIC PANEL
Anion gap: 9 (ref 5–15)
BUN: 15 mg/dL (ref 8–23)
CO2: 33 mmol/L — ABNORMAL HIGH (ref 22–32)
Calcium: 8.6 mg/dL — ABNORMAL LOW (ref 8.9–10.3)
Chloride: 92 mmol/L — ABNORMAL LOW (ref 98–111)
Creatinine, Ser: 0.56 mg/dL (ref 0.44–1.00)
GFR, Estimated: >60 mL/min (ref >60)
Glucose, Bld: 91 mg/dL (ref 70–99)
Potassium: 3.6 mmol/L (ref 3.5–5.1)
Sodium: 134 mmol/L — ABNORMAL LOW (ref 135–145)

## 2022-07-11 LAB — CBC
HCT: 30.7 % — ABNORMAL LOW (ref 36.0–46.0)
Hemoglobin: 9.3 g/dL — ABNORMAL LOW (ref 12.0–15.0)
MCH: 24.2 pg — ABNORMAL LOW (ref 26.0–34.0)
MCHC: 30.3 g/dL (ref 30.0–36.0)
MCV: 79.9 fL — ABNORMAL LOW (ref 80.0–100.0)
Platelets: 414 10*3/uL — ABNORMAL HIGH (ref 150–400)
RBC: 3.84 MIL/uL — ABNORMAL LOW (ref 3.87–5.11)
RDW: 16.2 % — ABNORMAL HIGH (ref 11.5–15.5)
WBC: 10.9 10*3/uL — ABNORMAL HIGH (ref 4.0–10.5)
nRBC: 0 % (ref 0.0–0.2)

## 2022-07-11 LAB — MAGNESIUM: Magnesium: 1.9 mg/dL (ref 1.7–2.4)

## 2022-07-11 LAB — BRAIN NATRIURETIC PEPTIDE: B Natriuretic Peptide: 439.7 pg/mL — ABNORMAL HIGH (ref 0.0–100.0)

## 2022-07-11 MED ORDER — ACETAMINOPHEN 325 MG PO TABS
650.0000 mg | ORAL_TABLET | Freq: Three times a day (TID) | ORAL | Status: DC
  Administered 2022-07-11 – 2022-07-14 (×9): 650 mg via ORAL
  Filled 2022-07-11 (×8): qty 2

## 2022-07-11 MED ORDER — MORPHINE SULFATE (PF) 2 MG/ML IV SOLN
2.0000 mg | INTRAVENOUS | Status: DC | PRN
  Administered 2022-07-11 – 2022-07-14 (×7): 2 mg via INTRAVENOUS
  Filled 2022-07-11 (×7): qty 1

## 2022-07-11 MED ORDER — FUROSEMIDE 10 MG/ML IJ SOLN
40.0000 mg | Freq: Once | INTRAMUSCULAR | Status: AC
  Administered 2022-07-11: 40 mg via INTRAVENOUS
  Filled 2022-07-11: qty 4

## 2022-07-11 NOTE — Progress Notes (Signed)
[facility]                                                                              [name], is a 76 y.o. female, DOB - 1945/11/18, [id number] Admit date - 07/05/2022    Outpatient Primary MD for the patient is Isaac Bliss, Rayford Halsted, MD  LOS - 6  days  Chief Complaint  Patient presents with   Altered Mental Status       Brief summary   76 y.o. 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 Presenting with shortness of breath. Hx is from daughter by phone. She says the patient was complaining of arm pain this morning at 3am. The patient was given some pain medicine and she went back to sleep. Around 9:30am, the patient seemed confused. She was mumbling and looking off into space. Family became concerned and called for EMS.      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 -Procalcitonin 0.14, continue IV cefepime -COVID-19, flu negative -Wheezing improving, continue DuoNebs, Pulmicort, Brovana, IV Solu-Medrol.   -BNP 439, will will give Lasix 40 mg IV x1, obtain 2D echo (previous echo 11/2020 showed EF 60 to 65%, G1 DD)   Active problems Mild acute on chronic diastolic CHF -BNP 381, Lasix 40 mg IV x1 today, follow 2D echo -Strict I's and O's and daily weights  Acute metabolic encephalopathy - Resolved, close to baseline  HTN - BP stable, continue Toprol-XL, Norvasc 5 mg daily -Continue IV hydralazine as needed with parameters    GERD - Continue PPI  HLP -Continue Pravachol  Prolonged QTc -EKG 11/6 had shown QTc of 479 -On EKG, 11/29  QTc improved to 438   Left clavicle fracture -Seen in ED on 11/23 for this after a fall.  -Left arm remains in sling.  -Continue pain control, bowel regimen and outpatient follow-up   Underweight Estimated body mass index is 17.48 kg/m as calculated from the following:   Height as of this encounter: '5\' 4"'$  (1.626 m).   Weight as of this encounter: 46.2 kg.  Code Status: Full code DVT Prophylaxis:  enoxaparin (LOVENOX) injection 40 mg Start: 07/06/22 2200   Level of Care: Level of care: Med-Surg Family Communication: None at bedside Disposition Plan:      Remains inpatient appropriate: Still somewhat short of breath, not stable for discharge.   Procedures:  None  Consultants:   None  Antimicrobials:   Anti-infectives (From admission, onward)    Start     Dose/Rate Route Frequency Ordered Stop   07/05/22 1600  vancomycin (VANCOREADY) IVPB 1250 mg/250 mL  Status:  Discontinued        1,250 mg 166.7 mL/hr over 90 Minutes Intravenous Every 48 hours 07/05/22 1525 07/06/22 1109   07/05/22 1600  ceFEPIme (MAXIPIME) 2 g in sodium chloride 0.9 % 100 mL IVPB        2 g 200 mL/hr over 30 Minutes Intravenous Every 12 hours 07/05/22 1525 07/12/22 1559          Medications  amLODipine  5 mg Oral Daily   arformoterol  15 mcg Nebulization BID   budesonide (PULMICORT) nebulizer solution  0.25 mg Nebulization BID   enoxaparin (LOVENOX) injection  40 mg Subcutaneous Q24H   famotidine  20 mg Oral Daily   gabapentin  100 mg Oral TID   ipratropium-albuterol  3 mL Nebulization TID   melatonin  5 mg Oral QHS   methylPREDNISolone (SOLU-MEDROL) injection  60 mg Intravenous Daily   metoprolol succinate  50 mg Oral Daily   nystatin  5 mL Oral QID   mouth rinse  15 mL Mouth Rinse 4 times per day   pantoprazole  40 mg Oral Daily   pravastatin  40 mg Oral QPM      Subjective:   [name] was seen and examined today.  Mildly better than yesterday however still somewhat short of  breath with minimal exertion, wheezing.  No chest pain, nausea vomiting or fevers.    Objective:   Vitals:   07/11/22 0416 07/11/22 0741 07/11/22 1140 07/11/22 1325  BP: (!) 149/107  (!) 159/82   Pulse: 87  92   Resp: 19     Temp: 98.6 F (37 C)     TempSrc:      SpO2: 100% 98%  99%  Weight:      Height:        Intake/Output Summary (Last 24 hours) at 07/11/2022 1347 Last data filed at 07/11/2022 0800 Gross per 24 hour  Intake 1756 ml  Output 1150 ml  Net 606 ml     Wt Readings from Last 3 Encounters:  07/05/22 46.2 kg  07/01/22 47.6 kg  06/15/22 44.4 kg    Physical Exam General: Alert and oriented x 3, NAD Cardiovascular: S1 S2 clear, RRR.  Respiratory: Bilateral expiratory wheezing with rhonchi Gastrointestinal: Soft, nontender, nondistended, NBS Ext: no pedal edema bilaterally Neuro: no new deficits Psych: Normal affect     Data Reviewed:  I have personally reviewed following labs    CBC Lab Results  Component Value Date   WBC 10.9 (H) 07/11/2022   RBC 3.84 (L) 07/11/2022   HGB 9.3 (L) 07/11/2022   HCT 30.7 (L) 07/11/2022   MCV 79.9 (L) 07/11/2022   MCH 24.2 (L) 07/11/2022   PLT 414 (H) 07/11/2022   MCHC 30.3 07/11/2022   RDW 16.2 (H) 07/11/2022   LYMPHSABS 1.2 07/05/2022   MONOABS 0.9 07/05/2022   EOSABS 0.2 07/05/2022   BASOSABS 0.0 81/85/6314     Last metabolic panel Lab Results  Component Value Date   NA 134 (L) 07/11/2022   K 3.6 07/11/2022   CL 92 (L) 07/11/2022   CO2 33 (H) 07/11/2022   BUN 15 07/11/2022   CREATININE 0.56 07/11/2022   GLUCOSE 91 07/11/2022   GFRNONAA >60 07/11/2022   GFRAA >60 08/28/2019   CALCIUM 8.6 (L) 07/11/2022   PHOS 3.2 09/23/2020   PROT 6.7 07/06/2022   ALBUMIN 2.4 (L) 07/06/2022   LABGLOB 4.3 05/24/2018   AGRATIO 0.8 (L) 05/24/2018   BILITOT 0.8 07/06/2022   ALKPHOS 76 07/06/2022   AST 13 (L) 07/06/2022   ALT 9 07/06/2022   ANIONGAP 9 07/11/2022     Radiology Studies: I have personally  reviewed  the imaging studies  No results found.     Estill Cotta M.D. [facility] 07/11/2022, 1:47 PM  Available via Epic secure chat 7am-7pm After 7 pm, please refer to night coverage provider listed on amion.

## 2022-07-11 NOTE — Progress Notes (Signed)
Returned call to [facility]- pt daughter as requested.  Updated provided and no other requests at this time.

## 2022-07-11 NOTE — Care Management Important Message (Signed)
Important Message  Patient Details IM Letter given Name: [name] MRN: [id number] Date of Birth: 03-17-1946   Medicare Important Message Given:  Yes     Kerin Salen 07/11/2022, 1:36 PM

## 2022-07-11 NOTE — Progress Notes (Signed)
PHYSICAL THERAPY  Pt was OOB in recliner vis nursing "since around 9:30 this morning" comfortable.  Therapy arrived to room around 2:00 pm and pt wanted to stay up.  Therapy returned to room approx 2:45 pm and NT was in room assisted pt back to bed.  Wheelchair ans RW was delivered and is in room.  Pt plans to D/C to home with family support.  Will continue to monitor and attempt to see another day.  Rica Koyanagi  PTA Acute  Rehabilitation Services Office M-F          [phone] Weekend pager [phone]

## 2022-07-11 NOTE — Progress Notes (Signed)
   07/11/22 1542  Assess: MEWS Score  Temp 98.2 F (36.8 C)  BP (!) 153/86  MAP (mmHg) 105  Pulse Rate 99  Resp (!) 36  SpO2 96 %  O2 Device Nasal Cannula  O2 Flow Rate (L/min) 3 L/min  Assess: MEWS Score  MEWS Temp 0  MEWS Systolic 0  MEWS Pulse 0  MEWS RR 3  MEWS LOC 0  MEWS Score 3  MEWS Score Color Yellow  Assess: if the MEWS score is Yellow or Red  Were vital signs taken at a resting state? Yes  Focused Assessment No change from prior assessment  Does the patient meet 2 or more of the SIRS criteria? Yes  Does the patient have a confirmed or suspected source of infection? Yes  Provider and Rapid Response Notified? Yes  MEWS guidelines implemented *See Row Information* Yes  Take Vital Signs  Increase Vital Sign Frequency  Yellow: Q 2hr X 2 then Q 4hr X 2, if remains yellow, continue Q 4hrs  Escalate  MEWS: Escalate Yellow: discuss with charge nurse/RN and consider discussing with provider and RRT  Notify: Charge Nurse/RN  Name of Charge Nurse/RN Notified C Ronan Duecker rn  Date Charge Nurse/RN Notified 07/11/22  Time Charge Nurse/RN Notified 1542  Provider Notification  Provider Name/Title Dr Tyler Pita  Date Provider Notified 07/11/22  Time Provider Notified 1545  Method of Notification Page  Notification Reason Change in status  Provider response See new orders  Date of Provider Response 07/11/22  Time of Provider Response 1550  Document  Patient Outcome Stabilized after interventions  Progress note created (see row info) Yes  Assess: SIRS CRITERIA  SIRS Temperature  0  SIRS Pulse 1  SIRS Respirations  1  SIRS WBC 1  SIRS Score Sum  3

## 2022-07-12 ENCOUNTER — Inpatient Hospital Stay (HOSPITAL_COMMUNITY): Payer: Medicare Other

## 2022-07-12 DIAGNOSIS — K219 Gastro-esophageal reflux disease without esophagitis: Secondary | ICD-10-CM

## 2022-07-12 DIAGNOSIS — I5032 Chronic diastolic (congestive) heart failure: Secondary | ICD-10-CM

## 2022-07-12 LAB — ECHOCARDIOGRAM COMPLETE
Area-P 1/2: 6.37 cm2
Calc EF: 66.4 %
Height: 64 in
S' Lateral: 2.1 cm
Single Plane A2C EF: 66.1 %
Single Plane A4C EF: 67.9 %
Weight: 1629.64 oz

## 2022-07-12 LAB — BASIC METABOLIC PANEL
Anion gap: 8 (ref 5–15)
BUN: 23 mg/dL (ref 8–23)
CO2: 35 mmol/L — ABNORMAL HIGH (ref 22–32)
Calcium: 8.6 mg/dL — ABNORMAL LOW (ref 8.9–10.3)
Chloride: 93 mmol/L — ABNORMAL LOW (ref 98–111)
Creatinine, Ser: 0.91 mg/dL (ref 0.44–1.00)
GFR, Estimated: >60 mL/min (ref >60)
Glucose, Bld: 109 mg/dL — ABNORMAL HIGH (ref 70–99)
Potassium: 3.5 mmol/L (ref 3.5–5.1)
Sodium: 136 mmol/L (ref 135–145)

## 2022-07-12 LAB — CBC
HCT: 32.1 % — ABNORMAL LOW (ref 36.0–46.0)
Hemoglobin: 9.7 g/dL — ABNORMAL LOW (ref 12.0–15.0)
MCH: 24.3 pg — ABNORMAL LOW (ref 26.0–34.0)
MCHC: 30.2 g/dL (ref 30.0–36.0)
MCV: 80.3 fL (ref 80.0–100.0)
Platelets: 446 10*3/uL — ABNORMAL HIGH (ref 150–400)
RBC: 4 MIL/uL (ref 3.87–5.11)
RDW: 16.3 % — ABNORMAL HIGH (ref 11.5–15.5)
WBC: 10.7 10*3/uL — ABNORMAL HIGH (ref 4.0–10.5)
nRBC: 0 % (ref 0.0–0.2)

## 2022-07-12 LAB — MAGNESIUM: Magnesium: 1.8 mg/dL (ref 1.7–2.4)

## 2022-07-12 MED ORDER — IPRATROPIUM-ALBUTEROL 0.5-2.5 (3) MG/3ML IN SOLN
3.0000 mL | Freq: Two times a day (BID) | RESPIRATORY_TRACT | Status: DC
  Administered 2022-07-12 – 2022-07-14 (×4): 3 mL via RESPIRATORY_TRACT
  Filled 2022-07-12 (×4): qty 3

## 2022-07-12 MED ORDER — FUROSEMIDE 40 MG PO TABS
40.0000 mg | ORAL_TABLET | Freq: Every day | ORAL | Status: DC
  Administered 2022-07-12 – 2022-07-14 (×3): 40 mg via ORAL
  Filled 2022-07-12 (×3): qty 1

## 2022-07-12 MED ORDER — AMLODIPINE BESYLATE 10 MG PO TABS
10.0000 mg | ORAL_TABLET | Freq: Every day | ORAL | Status: DC
  Administered 2022-07-12 – 2022-07-14 (×3): 10 mg via ORAL
  Filled 2022-07-12 (×3): qty 1

## 2022-07-12 NOTE — Progress Notes (Signed)
[facility]                                                                              [name], is a 76 y.o. female, DOB - 1946/03/16, [id number] Admit date - 07/05/2022    Outpatient Primary MD for the patient is Isaac Bliss, Rayford Halsted, MD  LOS - 7  days  Chief Complaint  Patient presents with   Altered Mental Status       Brief summary   76 y.o. 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 Presenting with shortness of breath. Hx is from daughter by phone. She says the patient was complaining of arm pain this morning at 3am. The patient was given some pain medicine and she went back to sleep. Around 9:30am, the patient seemed confused. She was mumbling and looking off into space. Family became concerned and called for EMS.      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 Continue DuoNebs, Pulmicort, Brovana, IV Solu-Medrol    Active problems Mild acute on chronic diastolic CHF -BNP 878, Lasix 40 mg IV x1 on 12/1 -Follow 2D echo -Will place on Lasix 40 mg daily -Strict I's and O's and daily weights  Acute metabolic encephalopathy - Resolved, close to baseline  HTN - continue Toprol-XL, Norvasc 5 mg daily -Continue IV hydralazine as needed with parameters -Added Lasix 40 mg daily   GERD - Continue PPI  HLP -Continue Pravachol  Prolonged QTc -EKG 11/6 had shown QTc of 479 -On EKG, 11/29 QTc improved to 438   Left clavicle fracture -Seen in ED on  11/23 for this after a fall.  -Left arm remains in sling.  -Continue pain control, bowel regimen and outpatient follow-up   Underweight Estimated body mass index is 17.48 kg/m as calculated from the following:   Height as of this encounter: '5\' 4"'$  (1.626 m).   Weight as of this encounter: 46.2 kg.  Code Status: Full code DVT Prophylaxis:  enoxaparin (LOVENOX) injection 40 mg Start: 07/06/22 2200   Level of Care: Level of care: Med-Surg Family Communication: Updated patient's daughter Ms. [name] on the phone Disposition Plan:      Remains inpatient appropriate: Still wheezing, will likely need 24 to 48 hours  Procedures:  None  Consultants:   None  Antimicrobials:   Anti-infectives (From admission, onward)    Start     Dose/Rate Route Frequency Ordered Stop   07/05/22 1600  vancomycin (VANCOREADY) IVPB 1250 mg/250 mL  Status:  Discontinued        1,250 mg 166.7 mL/hr over 90 Minutes Intravenous Every 48 hours 07/05/22 1525 07/06/22 1109   07/05/22 1600  ceFEPIme (MAXIPIME) 2 g in sodium chloride  0.9 % 100 mL IVPB        2 g 200 mL/hr over 30 Minutes Intravenous Every 12 hours 07/05/22 1525 07/12/22 0532          Medications  acetaminophen  650 mg Oral Q8H   amLODipine  10 mg Oral Daily   arformoterol  15 mcg Nebulization BID   budesonide (PULMICORT) nebulizer solution  0.25 mg Nebulization BID   enoxaparin (LOVENOX) injection  40 mg Subcutaneous Q24H   famotidine  20 mg Oral Daily   gabapentin  100 mg Oral TID   ipratropium-albuterol  3 mL Nebulization BID   melatonin  5 mg Oral QHS   methylPREDNISolone (SOLU-MEDROL) injection  60 mg Intravenous Daily   metoprolol succinate  50 mg Oral Daily   nystatin  5 mL Oral QID   mouth rinse  15 mL Mouth Rinse 4 times per day   pantoprazole  40 mg Oral Daily   pravastatin  40 mg Oral QPM      Subjective:   [name] was seen and examined today.  Progressive getting better, still somewhat short of  breath, wheezing and coughing.  No chest pain, fevers or chills.  No nausea or vomiting.    Objective:   Vitals:   07/12/22 0757 07/12/22 0800 07/12/22 1234 07/12/22 1235  BP:   (!) 148/75   Pulse:   97 95  Resp:   20   Temp:   98.3 F (36.8 C)   TempSrc:   Oral   SpO2: 99% 99%  100%  Weight:      Height:        Intake/Output Summary (Last 24 hours) at 07/12/2022 1334 Last data filed at 07/12/2022 0998 Gross per 24 hour  Intake 240 ml  Output 900 ml  Net -660 ml     Wt Readings from Last 3 Encounters:  07/05/22 46.2 kg  07/01/22 47.6 kg  06/15/22 44.4 kg    Physical Exam General: Alert and oriented x 3, NAD, ill-appearing Cardiovascular: S1 S2 clear, RRR.  Respiratory: Bilateral expiratory wheezing, rhonchi Gastrointestinal: Soft, nontender, nondistended, NBS Ext: no pedal edema bilaterally Neuro: no new deficits Psych: Normal affect     Data Reviewed:  I have personally reviewed following labs    CBC Lab Results  Component Value Date   WBC 10.7 (H) 07/12/2022   RBC 4.00 07/12/2022   HGB 9.7 (L) 07/12/2022   HCT 32.1 (L) 07/12/2022   MCV 80.3 07/12/2022   MCH 24.3 (L) 07/12/2022   PLT 446 (H) 07/12/2022   MCHC 30.2 07/12/2022   RDW 16.3 (H) 07/12/2022   LYMPHSABS 1.2 07/05/2022   MONOABS 0.9 07/05/2022   EOSABS 0.2 07/05/2022   BASOSABS 0.0 33/82/5053     Last metabolic panel Lab Results  Component Value Date   NA 136 07/12/2022   K 3.5 07/12/2022   CL 93 (L) 07/12/2022   CO2 35 (H) 07/12/2022   BUN 23 07/12/2022   CREATININE 0.91 07/12/2022   GLUCOSE 109 (H) 07/12/2022   GFRNONAA >60 07/12/2022   GFRAA >60 08/28/2019   CALCIUM 8.6 (L) 07/12/2022   PHOS 3.2 09/23/2020   PROT 6.7 07/06/2022   ALBUMIN 2.4 (L) 07/06/2022   LABGLOB 4.3 05/24/2018   AGRATIO 0.8 (L) 05/24/2018   BILITOT 0.8 07/06/2022   ALKPHOS 76 07/06/2022   AST 13 (L) 07/06/2022   ALT 9 07/06/2022   ANIONGAP 8 07/12/2022     Radiology Studies: I have personally  reviewed the  imaging studies  No results found.     Estill Cotta M.D. [facility] 07/12/2022, 1:34 PM  Available via Epic secure chat 7am-7pm After 7 pm, please refer to night coverage provider listed on amion.

## 2022-07-12 NOTE — Progress Notes (Signed)
Echocardiogram 2D Echocardiogram has been performed.  [name] 07/12/2022, 2:22 PM

## 2022-07-13 ENCOUNTER — Inpatient Hospital Stay (HOSPITAL_COMMUNITY): Payer: Medicare Other

## 2022-07-13 DIAGNOSIS — I10 Essential (primary) hypertension: Secondary | ICD-10-CM

## 2022-07-13 LAB — BASIC METABOLIC PANEL
Anion gap: 8 (ref 5–15)
BUN: 20 mg/dL (ref 8–23)
CO2: 38 mmol/L — ABNORMAL HIGH (ref 22–32)
Calcium: 8.5 mg/dL — ABNORMAL LOW (ref 8.9–10.3)
Chloride: 90 mmol/L — ABNORMAL LOW (ref 98–111)
Creatinine, Ser: 0.77 mg/dL (ref 0.44–1.00)
GFR, Estimated: >60 mL/min (ref >60)
Glucose, Bld: 85 mg/dL (ref 70–99)
Potassium: 3.2 mmol/L — ABNORMAL LOW (ref 3.5–5.1)
Sodium: 136 mmol/L (ref 135–145)

## 2022-07-13 LAB — CBC
HCT: 34.2 % — ABNORMAL LOW (ref 36.0–46.0)
Hemoglobin: 10.6 g/dL — ABNORMAL LOW (ref 12.0–15.0)
MCH: 24.4 pg — ABNORMAL LOW (ref 26.0–34.0)
MCHC: 31 g/dL (ref 30.0–36.0)
MCV: 78.8 fL — ABNORMAL LOW (ref 80.0–100.0)
Platelets: 449 10*3/uL — ABNORMAL HIGH (ref 150–400)
RBC: 4.34 MIL/uL (ref 3.87–5.11)
RDW: 16.3 % — ABNORMAL HIGH (ref 11.5–15.5)
WBC: 13.9 10*3/uL — ABNORMAL HIGH (ref 4.0–10.5)
nRBC: 0 % (ref 0.0–0.2)

## 2022-07-13 LAB — MAGNESIUM: Magnesium: 1.8 mg/dL (ref 1.7–2.4)

## 2022-07-13 MED ORDER — IOHEXOL 350 MG/ML SOLN: 100.0000 mL | Freq: Once | INTRAVENOUS | Status: DC | PRN

## 2022-07-13 NOTE — Progress Notes (Addendum)
[facility]                                                                              [name], is a 76 y.o. female, DOB - 04/28/1946, [id number] Admit date - 07/05/2022    Outpatient Primary MD for the patient is Isaac Bliss, Rayford Halsted, MD  LOS - 7  days  Chief Complaint  Patient presents with   Altered Mental Status       Brief summary   76 y.o. 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 Presenting with shortness of breath. Hx is from daughter by phone. She says the patient was complaining of arm pain this morning at 3am. The patient was given some pain medicine and she went back to sleep. Around 9:30am, the patient seemed confused. She was mumbling and looking off into space. Family became concerned and called for EMS.      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    Active problems Acute on chronic diastolic CHF, severe tricuspid regurgitation -BNP 439, Lasix 40 mg IV x1 on 12/1 -2D echo showed EF of 70 to 75%, G1 DD, severe tricuspid valve regurgitation (new from prior echo in 4//2022) -Placed on oral Lasix, 40 mg daily negative balance of 1.1 L - will consult cardiology for new severe TR  Acute metabolic encephalopathy - Resolved, close to baseline  HTN - continue Toprol-XL, Norvasc 5 mg daily -Continue IV hydralazine as needed with parameters -Added Lasix 40 mg  daily   GERD - Continue PPI  HLP -Continue Pravachol  Prolonged QTc -EKG 11/6 had shown QTc of 479 -On EKG, 11/29 QTc improved to 438   Left clavicle fracture -Seen in ED on 11/23 for this after a fall.  -Left arm remains in sling.  -Continue pain control, bowel regimen and outpatient follow-up    Underweight Estimated body mass index is 17.48 kg/m as calculated from the following:   Height as of this encounter: '5\' 4"'$  (1.626 m).   Weight as of this encounter: 46.2 kg.  Code Status: Full code DVT Prophylaxis:  enoxaparin (LOVENOX) injection 40 mg Start: 07/06/22 2200   Level of Care: Level of care: Med-Surg Family Communication: Updated patient's daughter [name] on the phone on 12/2, daughter [name] today  Disposition Plan:      Remains inpatient appropriate: Improving however still somewhat short of breath and debilitated   Procedures:  None  Consultants:   None  Antimicrobials:   Anti-infectives (From admission, onward)    Start     Dose/Rate Route Frequency Ordered Stop   07/05/22 1600  vancomycin (VANCOREADY) IVPB 1250 mg/250 mL  Status:  Discontinued        1,250 mg 166.7 mL/hr over 90 Minutes Intravenous Every 48 hours 07/05/22 1525 07/06/22 1109   07/05/22 1600  ceFEPIme (MAXIPIME) 2 g in sodium chloride 0.9 % 100 mL IVPB        2 g 200 mL/hr over 30 Minutes Intravenous Every 12 hours 07/05/22 1525 07/12/22 0532          Medications  acetaminophen  650 mg Oral Q8H   amLODipine  10 mg Oral Daily   arformoterol  15 mcg Nebulization BID   budesonide (PULMICORT) nebulizer solution  0.25 mg Nebulization BID   enoxaparin (LOVENOX) injection  40 mg Subcutaneous Q24H   famotidine  20 mg Oral Daily   gabapentin  100 mg Oral TID   ipratropium-albuterol  3 mL Nebulization BID   melatonin  5 mg Oral QHS   methylPREDNISolone (SOLU-MEDROL) injection  60 mg Intravenous Daily   metoprolol succinate  50 mg Oral Daily   nystatin  5 mL Oral QID    mouth rinse  15 mL Mouth Rinse 4 times per day   pantoprazole  40 mg Oral Daily   pravastatin  40 mg Oral QPM      Subjective:   [name] was seen and examined today.  Still somewhat short of breath, wheezing is improving.  No chest pain, fevers or chills.    Objective:   Vitals:   07/12/22 0757 07/12/22 0800 07/12/22 1234 07/12/22 1235  BP:   (!) 148/75   Pulse:   97 95  Resp:   20   Temp:   98.3 F (36.8 C)   TempSrc:   Oral   SpO2: 99% 99%  100%  Weight:      Height:        Intake/Output Summary (Last 24 hours) at 07/12/2022 1334 Last data filed at 07/12/2022 1937 Gross per 24 hour  Intake 240 ml  Output 900 ml  Net -660 ml     Wt Readings from Last 3 Encounters:  07/05/22 46.2 kg  07/01/22 47.6 kg  06/15/22 44.4 kg   Physical Exam General: Alert and oriented x 3, NAD, ill-appearing, frail Cardiovascular: S1 S2 clear, RRR.  Respiratory: Diminished breath sound at the bases with rhonchi Gastrointestinal: Soft, nontender, nondistended, NBS Ext: no pedal edema bilaterally Neuro: no new deficits Psych: Normal affect     Data Reviewed:  I have personally reviewed following labs    CBC Lab Results  Component Value Date   WBC 10.7 (H) 07/12/2022   RBC 4.00 07/12/2022   HGB 9.7 (L) 07/12/2022   HCT 32.1 (L) 07/12/2022   MCV 80.3 07/12/2022   MCH 24.3 (L) 07/12/2022   PLT 446 (H) 07/12/2022   MCHC 30.2 07/12/2022   RDW 16.3 (H) 07/12/2022   LYMPHSABS 1.2 07/05/2022   MONOABS 0.9 07/05/2022   EOSABS 0.2 07/05/2022   BASOSABS 0.0 90/24/0973     Last metabolic panel Lab Results  Component Value Date   NA 136 07/12/2022   K 3.5 07/12/2022   CL 93 (L) 07/12/2022   CO2 35 (H) 07/12/2022   BUN 23 07/12/2022   CREATININE 0.91 07/12/2022   GLUCOSE 109 (H) 07/12/2022   GFRNONAA >60 07/12/2022   GFRAA >60 08/28/2019   CALCIUM 8.6 (L) 07/12/2022   PHOS 3.2 09/23/2020   PROT 6.7 07/06/2022   ALBUMIN 2.4 (L) 07/06/2022   LABGLOB 4.3 05/24/2018    AGRATIO 0.8 (L) 05/24/2018   BILITOT 0.8  07/06/2022   ALKPHOS 76 07/06/2022   AST 13 (L) 07/06/2022   ALT 9 07/06/2022   ANIONGAP 8 07/12/2022     Radiology Studies: I have personally reviewed the imaging studies  No results found.     Estill Cotta M.D. [facility] 07/12/2022, 1:34 PM  Available via Epic secure chat 7am-7pm After 7 pm, please refer to night coverage provider listed on amion.

## 2022-07-14 ENCOUNTER — Ambulatory Visit: Payer: Medicare Other | Admitting: Pulmonary Disease

## 2022-07-14 LAB — BASIC METABOLIC PANEL
Anion gap: 7 (ref 5–15)
BUN: 28 mg/dL — ABNORMAL HIGH (ref 8–23)
CO2: 40 mmol/L — ABNORMAL HIGH (ref 22–32)
Calcium: 8.5 mg/dL — ABNORMAL LOW (ref 8.9–10.3)
Chloride: 87 mmol/L — ABNORMAL LOW (ref 98–111)
Creatinine, Ser: 0.77 mg/dL (ref 0.44–1.00)
GFR, Estimated: >60 mL/min (ref >60)
Glucose, Bld: 127 mg/dL — ABNORMAL HIGH (ref 70–99)
Potassium: 3.2 mmol/L — ABNORMAL LOW (ref 3.5–5.1)
Sodium: 134 mmol/L — ABNORMAL LOW (ref 135–145)

## 2022-07-14 LAB — CBC
HCT: 32.1 % — ABNORMAL LOW (ref 36.0–46.0)
Hemoglobin: 10 g/dL — ABNORMAL LOW (ref 12.0–15.0)
MCH: 24.4 pg — ABNORMAL LOW (ref 26.0–34.0)
MCHC: 31.2 g/dL (ref 30.0–36.0)
MCV: 78.3 fL — ABNORMAL LOW (ref 80.0–100.0)
Platelets: 411 10*3/uL — ABNORMAL HIGH (ref 150–400)
RBC: 4.1 MIL/uL (ref 3.87–5.11)
RDW: 16 % — ABNORMAL HIGH (ref 11.5–15.5)
WBC: 13.2 10*3/uL — ABNORMAL HIGH (ref 4.0–10.5)
nRBC: 0 % (ref 0.0–0.2)

## 2022-07-14 LAB — MAGNESIUM: Magnesium: 1.6 mg/dL — ABNORMAL LOW (ref 1.7–2.4)

## 2022-07-14 MED ORDER — PREDNISONE 10 MG PO TABS: ORAL_TABLET | ORAL | 0 refills | Status: AC

## 2022-07-14 MED ORDER — NYSTATIN 100000 UNIT/ML MT SUSP: 5.0000 mL | Freq: Four times a day (QID) | OROMUCOSAL | 0 refills | Status: AC

## 2022-07-14 MED ORDER — HYDROCODONE-ACETAMINOPHEN 5-325 MG PO TABS: 1.0000 | ORAL_TABLET | Freq: Three times a day (TID) | ORAL | 0 refills | Status: AC | PRN

## 2022-07-14 MED ORDER — AMLODIPINE BESYLATE 10 MG PO TABS: 10.0000 mg | ORAL_TABLET | Freq: Every day | ORAL | 3 refills | Status: AC

## 2022-07-14 MED ORDER — FLUTICASONE-SALMETEROL 500-50 MCG/ACT IN AEPB: 1.0000 | INHALATION_SPRAY | Freq: Two times a day (BID) | RESPIRATORY_TRACT | 2 refills | Status: AC

## 2022-07-14 MED ORDER — SPIRONOLACTONE 25 MG PO TABS: 25.0000 mg | ORAL_TABLET | Freq: Every day | ORAL | 3 refills | Status: AC

## 2022-07-14 MED ORDER — FUROSEMIDE 40 MG PO TABS: 40.0000 mg | ORAL_TABLET | Freq: Every day | ORAL | 3 refills | Status: DC

## 2022-07-14 MED ORDER — POTASSIUM CHLORIDE CRYS ER 20 MEQ PO TBCR
40.0000 meq | EXTENDED_RELEASE_TABLET | Freq: Once | ORAL | Status: AC
  Administered 2022-07-14: 40 meq via ORAL
  Filled 2022-07-14: qty 2

## 2022-07-14 MED ORDER — GUAIFENESIN 100 MG/5ML PO LIQD: 5.0000 mL | ORAL | 0 refills | Status: AC | PRN

## 2022-07-14 MED ORDER — SPIRONOLACTONE 25 MG PO TABS
25.0000 mg | ORAL_TABLET | Freq: Every day | ORAL | Status: DC
  Administered 2022-07-14: 25 mg via ORAL
  Filled 2022-07-14: qty 1

## 2022-07-14 MED ORDER — FUROSEMIDE 20 MG PO TABS: 20.0000 mg | ORAL_TABLET | Freq: Every day | ORAL | 3 refills | Status: AC | PRN

## 2022-07-14 NOTE — Consult Note (Addendum)
CARDIOLOGY CONSULT NOTE  Patient ID: [name] MRN: [id number] DOB/AGE: Apr 27, 1946 76 y.o.  Admit date: 07/05/2022 Referring Physician  Ripu Rai, MD Primary Physician:  Isaac Bliss, Rayford Halsted, MD Reason for Consultation  Severe pulmonary hypertension  Patient ID: [name], female    DOB: October 01, 1945, 76 y.o.   MRN: [id number]  Chief Complaint  Patient presents with   Altered Mental Status   HPI:    [name]  is a 76 y.o. 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  Recently discharged from the hospital on 06/17/2022 after a 2-week [name] in the hospital for respiratory failure.  Patient now admitted with altered mental status similar to the prior presentation due to hypoxemic respiratory failure.   This morning patient is feeling well, very alert, denies any specific complaints except for mild chronic dyspnea.  No chest pain, palpitations, no leg edema.  Past Medical History:  Diagnosis Date   Blind left eye    Cataract of both eyes 01/18/2009   Right surgically repaired   COPD with emphysema ([place]) 02/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   Depression 06/08/2007   Essential hypertension, benign 06/08/2007   Family history of breast cancer    GERD 01/18/2009   Osteoporosis 12/31/2012   Left femur had T score -2.5 on DEXA scan 02/26/2009.    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   Vitamin D deficiency    Past Surgical History:  Procedure Laterality Date   CATARACT EXTRACTION Right    COLONOSCOPY  2002   HIP ARTHROPLASTY Right    HIP CLOSED REDUCTION  Right 04/08/2021   Procedure: CLOSED REDUCTION HIP;  Surgeon: Mcarthur Rossetti, MD;  Location: [place];  Service: Orthopedics;  Laterality: Right;   HIP CLOSED REDUCTION Right 09/15/2021   Procedure: CLOSED REDUCTION HIP;  Surgeon: Melina Schools, MD;  Location: [facility];  Service: Orthopedics;  Laterality: Right;   TUBAL LIGATION     Social History   Tobacco Use   Smoking status: Former    Packs/day: 0.25    Years: 30.00    Total pack years: 7.50    Types: Cigarettes    Quit date: 05/11/2010    Years since quitting: 12.1   Smokeless tobacco: Never  Substance Use Topics   Alcohol use: Not Currently    Comment: wine cooler.    Family History  Problem Relation Age of Onset   Heart disease Mother    Breast cancer Mother 55   Stroke Father    Heart disease Sister    Asthma Daughter    Cancer Other    Diabetes Other    Hypertension Other    COPD Brother        "9/11 survivor"   COPD Sister        "9/11 survivor"   Breast cancer Maternal Grandmother        dx in her 40s   Breast cancer Other        MGMs mother died in her 53s   Colon cancer Neg Hx     Marital Status: Widowed  ROS  Review of Systems  Cardiovascular:  Positive for dyspnea on exertion. Negative for chest pain and leg swelling.   Objective      07/14/2022    5:00 AM 07/13/2022   10:19 PM 07/13/2022    3:53 PM  Vitals with BMI  Systolic 161 096 045  Diastolic 91 79 71  Pulse 82 80 95    Blood pressure (!) 160/91, pulse 82, temperature 98 F (36.7 C), temperature source Oral, resp. rate 18, height '5\' 4"'$  (1.626 m), weight 46.2 kg, SpO2 100 %.   Bedside blood pressure 138/76 mmHg at 9:40 AM on 07/14/2022  Physical Exam Constitutional:      Appearance: She is underweight.  Neck:     Vascular: No carotid bruit or JVD.  Cardiovascular:     Rate and Rhythm: Normal rate and regular rhythm.     Pulses:          Dorsalis pedis pulses are 0 on the right side and 0 on the left side.       Posterior tibial  pulses are 0 on the right side and 0 on the left side.     Heart sounds: No murmur heard.    No gallop.     Comments: S1 is normal, S2 is prominent. Pulmonary:     Effort: Pulmonary effort is normal.     Breath sounds: Normal breath sounds.  Abdominal:     General: Bowel sounds are normal.     Palpations: Abdomen is soft.  Musculoskeletal:     Right lower leg: No edema.     Left lower leg: No edema.    Laboratory examination:   Recent Labs    07/12/22 0543 07/13/22 0507 07/14/22 0425  NA 136 136 134*  K 3.5 3.2* 3.2*  CL 93* 90* 87*  CO2 35* 38* 40*  GLUCOSE 109* 85 127*  BUN 23 20 28*  CREATININE 0.91 0.77 0.77  CALCIUM 8.6* 8.5* 8.5*  GFRNONAA >60 >60 >60   estimated creatinine clearance is 43.6 mL/min (by C-G formula based on SCr of 0.77 mg/dL).     Latest Ref Rng & Units 07/14/2022    4:25 AM 07/13/2022    5:07 AM 07/12/2022    5:43 AM  CMP  Glucose 70 - 99 mg/dL 127  85  109   BUN 8 - 23 mg/dL '28  20  23   '$ Creatinine 0.44 - 1.00 mg/dL 0.77  0.77  0.91   Sodium 135 - 145 mmol/L 134  136  136   Potassium 3.5 - 5.1 mmol/L 3.2  3.2  3.5   Chloride 98 - 111 mmol/L 87  90  93   CO2 22 - 32 mmol/L 40  38  35   Calcium 8.9 - 10.3 mg/dL 8.5  8.5  8.6       Latest Ref Rng & Units 07/14/2022    4:25 AM 07/13/2022    5:07 AM 07/12/2022    5:43 AM  CBC  WBC 4.0 - 10.5 K/uL 13.2  13.9  10.7   Hemoglobin 12.0 - 15.0 g/dL 10.0  10.6  9.7   Hematocrit 36.0 - 46.0 % 32.1  34.2  32.1   Platelets 150 - 400 K/uL 411  449  446    Lipid Panel Recent Labs    10/16/21 1603  CHOL 134  TRIG 104.0  LDLCALC 42  VLDL 20.8  HDL 71.10  CHOLHDL 2   BNP (last 3 results) Recent Labs    07/07/22 0252 07/11/22  0527  BNP 431.3* 439.7*   Cardiac Panel (last 3 results) No results for input(s): "CKTOTAL", "CKMB", "TROPONINIHS", "RELINDX" in the last 72 hours.   Medications and allergies   Allergies  Allergen Reactions   Ace Inhibitors Swelling   Lisinopril Swelling     REACTION: Angioedema   Valsartan Swelling    REACTION: angioedema.  Pt should not get ACEI or ARBs of any kind due to angioedema.   Aspirin Other (See Comments)    REACTION: GI ulcer.   Penicillins     Childhood allergy Has patient had a PCN reaction causing immediate rash, facial/tongue/throat swelling, SOB or lightheadedness with hypotension: Unknown Has patient had a PCN reaction causing severe rash involving mucus membranes or skin necrosis: Unknown Has patient had a PCN reaction that required hospitalization: Unknown Has patient had a PCN reaction occurring within the last 10 years: Unknown If all of the above answers are "NO", then may proceed with Cephalosporin use.      Current Meds  Medication Sig   acetaminophen (TYLENOL) 500 MG tablet Take 1,000 mg by mouth every 6 (six) hours as needed for moderate pain.   albuterol (PROVENTIL) (2.5 MG/3ML) 0.083% nebulizer solution Inhale 3 mLs (2.5 mg total) by nebulization every 6 (six) hours as needed for wheezing.   albuterol (VENTOLIN HFA) 108 (90 Base) MCG/ACT inhaler INHALE TWO PUFFS EVERY 6 HOURS AS NEEDED FOR WHEEZING (Patient taking differently: Inhale 2 puffs into the lungs every 6 (six) hours as needed for wheezing. INHALE TWO PUFFS EVERY 6 HOURS AS NEEDED FOR WHEEZING)   amLODipine (NORVASC) 5 MG tablet Take 1 tablet (5 mg total) by mouth daily.   famotidine (PEPCID) 20 MG tablet TAKE ONE TABLET BY MOUTH TWICE DAILY (Patient taking differently: Take 20 mg by mouth 2 (two) times daily.)   fluticasone-salmeterol (WIXELA INHUB) 500-50 MCG/ACT AEPB Inhale 1 puff into the lungs in the morning and at bedtime.   gabapentin (NEURONTIN) 100 MG capsule TAKE ONE CAPSULE BY MOUTH THREE TIMES DAILY (Patient taking differently: Take 100 mg by mouth 3 (three) times daily.)   [EXPIRED] HYDROcodone-acetaminophen (NORCO/VICODIN) 5-325 MG tablet Take 1 tablet by mouth every 4 (four) hours as needed for up to 3 days.   ipratropium (ATROVENT) 0.02 %  nebulizer solution Take 2.5 mLs (0.5 mg total) by nebulization 2 (two) times daily.   melatonin 5 MG TABS Take 2 tablets (10 mg total) by mouth at bedtime. (Patient taking differently: Take 5 mg by mouth at bedtime.)   meloxicam (MOBIC) 7.5 MG tablet Take 1 tablet (7.5 mg total) by mouth daily.   mirtazapine (REMERON) 15 MG tablet TAKE ONE TABLET BY MOUTH AT BEDTIME (Patient taking differently: Take 15 mg by mouth at bedtime.)   pantoprazole (PROTONIX) 40 MG tablet TAKE ONE TABLET BY MOUTH DAILY   pravastatin (PRAVACHOL) 40 MG tablet Take 1 tablet (40 mg total) by mouth every evening.   predniSONE (DELTASONE) 10 MG tablet Take 4 tablets daily X 2 days, then, Take 3 tablets daily X 2 days, then, Take 2 tablets daily X 2 days, then, Take 1 tablets daily X 1 day.   [DISCONTINUED] metoprolol succinate (TOPROL-XL) 50 MG 24 hr tablet TAKE ONE TABLET BY MOUTH DAILY WITH OR IMMEDIATELY FOLLOWING A MEAL (Patient taking differently: Take 50 mg by mouth daily. TAKE ONE TABLET BY MOUTH DAILY WITH OR IMMEDIATELY FOLLOWING A MEAL)    Scheduled Meds:  acetaminophen  650 mg Oral Q8H   amLODipine  10 mg Oral Daily  arformoterol  15 mcg Nebulization BID   budesonide (PULMICORT) nebulizer solution  0.25 mg Nebulization BID   enoxaparin (LOVENOX) injection  40 mg Subcutaneous Q24H   famotidine  20 mg Oral Daily   furosemide  40 mg Oral Daily   gabapentin  100 mg Oral TID   ipratropium-albuterol  3 mL Nebulization BID   melatonin  5 mg Oral QHS   methylPREDNISolone (SOLU-MEDROL) injection  60 mg Intravenous Daily   metoprolol succinate  50 mg Oral Daily   nystatin  5 mL Oral QID   mouth rinse  15 mL Mouth Rinse 4 times per day   pantoprazole  40 mg Oral Daily   pravastatin  40 mg Oral QPM   Continuous Infusions: PRN Meds:.acetaminophen **OR** acetaminophen, guaiFENesin, hydrALAZINE, iohexol, ipratropium-albuterol, metoprolol tartrate, morphine injection, ondansetron (ZOFRAN) IV, mouth rinse,  prochlorperazine, senna-docusate, traZODone   I/O last 3 completed shifts: In: -  Out: 2951 [Urine:1750] No intake/output data recorded.  Net IO Since Admission: -1,741.98 mL [07/14/22 0803]   Radiology:   CXR one view 07/05/2022: Chronic findings are again noted consistent with history of sarcoidosis. There is interval development of lobular density seen in right midlung which may represent focal inflammation or pneumonia. Radiographic follow-up is recommended to ensure resolution or stability. Also noted is mildly angulated distal left clavicular fracture.  Cardiac Studies:   Echocardiogram 07/12/2022:   1. Left ventricular ejection fraction, by estimation, is 70 to 75%. The left ventricle has hyperdynamic function. The left ventricle has no regional wall motion abnormalities. Left ventricular diastolic parameters are consistent with Grade I diastolic  dysfunction (impaired relaxation).  2. Right ventricular systolic function is normal. The right ventricular size is normal. There is severely elevated pulmonary artery systolic pressure. The estimated right ventricular systolic pressure is 88.4 mmHg.  3. The mitral valve is normal in structure. No evidence of mitral valve regurgitation. No evidence of mitral stenosis.  4. Tricuspid valve regurgitation is severe.  5. The aortic valve is tricuspid. There is mild calcification of the aortic valve. Aortic valve regurgitation is not visualized. Aortic valve sclerosis is present, with no evidence of aortic valve stenosis.  6. The inferior vena cava is normal in size with greater than 50% respiratory variability, suggesting right atrial pressure of 3 mmHg. Compared to the study done on 11/12/2020, previously moderate TR, now severe.  Previously moderate pulmonary hypertension now severe.  EKG:  EKG 07/09/2022: Normal sinus rhythm at rate of 88 bpm, right atrial enlargement, normal axis.  Poor R wave progression, pulmonary disease pattern.  No  evidence of ischemia.  Assessment   1.  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 2.  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  Chronic hypoxemic respiratory failure 4.  Primary hypertension   Recommendations:   Patient from cardiac standpoint is very well compensated, and spite of severe TR (multiple TR jets, no murmur appreciated).  There is no JVD, no hepatomegaly or leg edema.  She is already on amlodipine, continue the same.  She would not be a good candidate for advanced WHO group 3 therapy in view of severe COPD and end-stage sarcoidosis.  Patient has had multiple hypoxemic respiratory failure admissions with altered mental status.  Suspect she probably will have recurrence.  I discussed with her regarding end-of-life issues, patient states that she does not want to be on a  ventilator, does not want CPR, states that her daughter is aware.  Patient is presently full code.  With regard to hypertension, blood pressure is well-controlled, but has had elevations periodically, will add spironolactone 25 mg in the morning which will help with diastolic heart failure as well.  She will need BMP in 10 days to 2 weeks during follow-up with her PCP.  ARB not used as she has allergy to this.  From cardiac standpoint she is stable, no further evaluation is indicated.  Please call if questions.  Discussed with primary team.   Adrian Prows, MD, [facility] 07/14/2022, 8:03 AM Office: [phone]

## 2022-07-14 NOTE — TOC Transition Note (Signed)
Transition of Care [facility]) - CM/SW Discharge Note   Patient Details  Name: [name] MRN: [id number] Date of Birth: 1946-02-22  [facility] of [facility] CM/SW Contact:  Vassie Moselle, LCSW Phone Number: 07/14/2022, 10:35 AM   Clinical Narrative:    Pt is to return home with daughter and will be receiving HHPT/OT/RN services through [facility]. Pt has wheelchair in room delivered by [employer]. Daughter is to provide transportation at discharge.   Final next level of care: [place] Barriers to Discharge: No Barriers Identified   Patient Goals and CMS Choice Patient states their goals for this hospitalization and ongoing recovery are:: To go home CMS Medicare.gov Compare Post Acute Care list provided to:: Patient Choice offered to / list presented to : Patient, Adult Children  Discharge Placement                       Discharge Plan and Services In-house Referral: NA Discharge Planning Services: CM Consult Post Acute Care Choice: Durable Medical Equipment, Home Health          DME Arranged: Wheelchair manual DME Agency: [employer] Date DME Agency Contacted: 07/09/22 Time DME Agency Contacted: [phone] Representative spoke with at DME Agency: Erasmo Downer HH Arranged: RN, PT, OT [facility] Agency: [facility] Date [place]: 07/08/22 Time [place]: 46 Representative spoke with at [place]: [place]) Interventions     Readmission Risk Interventions    07/14/2022    8:29 AM 07/09/2022    1:17 PM 07/08/2022   10:19 AM  Readmission Risk Prevention Plan  Transportation Screening  Complete Complete  PCP or Specialist Appt within 3-5 Days   Complete  HRI or [place]   Complete  Social Work Consult for [place] Planning/Counseling   [place]   Not Applicable  Medication Review [employer] photographer) Complete  Complete  PCP or Specialist appointment within 3-5  days of discharge Complete    HRI or [place] Complete    SW Recovery Care/Counseling Consult Complete    [place] Not Applicable

## 2022-07-14 NOTE — Discharge Summary (Signed)
Physician Discharge Summary   Patient: [name] MRN: [id number] DOB: 1946-08-03  Admit date:     07/05/2022  Discharge date: 07/14/22  Discharge Physician: Estill Cotta, MD    PCP: Isaac Bliss, Rayford Halsted, MD   Recommendations at discharge:   Started on Lasix 20 mg daily as needed for fluids, shortness of breath Started on spironolactone 25 mg daily Needs bmet in 7-10days  Continue prednisone with taper 40 mg for 3 days, 30 mg for 3 days, 20 mg for 3 days, 10 mg for 3 days then off Recommended outpatient consult with palliative medicine  Discharge Diagnoses:    Acute on chronic respiratory failure with hypoxia and hypercapnia (HCC) Right midlung community-acquired pneumonia   Acute metabolic encephalopathy Severe pulmonary hypertension, tricuspid regurgitation Mild acute on chronic diastolic CHF   Sarcoidosis   Essential hypertension, benign   COPD with acute exacerbation ([place])   GERD    Pulmonary fibrosis ([place])   Clavicle fracture    Hospital Course: 76 y.o. 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 Presenting with shortness of breath. Hx is from daughter by phone. She says the patient was complaining of arm pain this morning at 3am. The patient was given some pain medicine and she went back to sleep. Around 9:30am, the patient seemed confused. She was mumbling and looking off into space. Family became concerned and called for EMS.      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 -COVID-19, flu negative -Patient was placed on DuoNebs, Pulmicort, Brovana, IV  Solu-Medrol -She has completed IV antibiotics, transition to oral prednisone with taper, continue nebs at home  -Recommend outpatient palliative medicine consult    Acute on chronic diastolic CHF, severe tricuspid regurgitation, severe pulmonary hypertension -BNP 439, Lasix 40 mg IV x1 on 12/1 -2D echo showed EF of 70 to 75%, G1 DD, severe tricuspid valve regurgitation (new from prior echo in 4//2022) -Cardiology was consulted, seen by Dr. Einar Gip.  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  -Left arm remains in sling.  -Continue pain control, bowel regimen and outpatient follow-up      Underweight Estimated body mass index is 17.48 kg/m as calculated from the following:   Height as of this encounter: '5\' 4"'$  (1.626 m).   Weight as of this encounter: 46.2 kg.      Pain control - [employer] Controlled Substance Reporting System database was reviewed. and patient was instructed, not to drive, operate heavy machinery, perform activities at heights, swimming or participation in water activities or provide baby-sitting services while on Pain, Sleep and Anxiety Medications; until their outpatient Physician has advised  to do so again. Also recommended to not to take more than prescribed Pain, Sleep and Anxiety Medications.  Consultants: Cardiology Procedures performed: 2D echo Disposition: Home Diet recommendation:  Discharge Diet Orders (From admission,  onward)     Start     Ordered   07/14/22 0000  Diet - low sodium heart healthy        07/14/22 1031           DISCHARGE MEDICATION: Allergies as of 07/14/2022       Reactions   Ace Inhibitors Swelling   Lisinopril Swelling   REACTION: Angioedema   Valsartan Swelling   REACTION: angioedema.  Pt should not get ACEI or ARBs of any kind due to angioedema.   Aspirin Other (See Comments)   REACTION: GI ulcer.   Penicillins    Childhood allergy Has patient had a PCN reaction causing immediate rash, facial/tongue/throat swelling, SOB or lightheadedness with hypotension: Unknown Has patient had a PCN reaction causing severe rash involving mucus membranes or skin necrosis: Unknown Has patient had a PCN reaction that required hospitalization: Unknown Has patient had a PCN reaction occurring within the last 10 years: Unknown If all of the above answers are "NO", then may proceed with Cephalosporin use.        Medication List     STOP taking these medications    hydrochlorothiazide 25 MG tablet Commonly known as: HYDRODIURIL   meloxicam 7.5 MG tablet Commonly known as: MOBIC       TAKE these medications    acetaminophen 500 MG tablet Commonly known as: TYLENOL Take 1,000 mg by mouth every 6 (six) hours as needed for moderate pain.   albuterol 108 (90 Base) MCG/ACT inhaler Commonly known as: VENTOLIN HFA INHALE TWO PUFFS EVERY 6 HOURS AS NEEDED FOR WHEEZING What changed: See the new instructions.   albuterol (2.5 MG/3ML) 0.083% nebulizer solution Commonly known as: PROVENTIL Inhale 3 mLs (2.5 mg total) by nebulization every 6 (six) hours as needed for wheezing. What changed: Another medication with the same name was changed. Make sure you understand how and when to take each.   amLODipine 10 MG tablet Commonly known as: NORVASC Take 1 tablet (10 mg total) by mouth daily. What changed:  medication strength how much to take   famotidine 20 MG tablet Commonly  known as: PEPCID TAKE ONE TABLET BY MOUTH TWICE DAILY   fluticasone-salmeterol 500-50 MCG/ACT Aepb Commonly known as: Wixela Inhub Inhale 1 puff into the lungs in the morning and at bedtime.   furosemide 20 MG tablet Commonly known as: LASIX Take 1 tablet (20 mg total) by mouth daily as needed for fluid or edema (shortness of breath).   gabapentin 100 MG capsule Commonly known as: NEURONTIN TAKE ONE CAPSULE BY MOUTH THREE TIMES DAILY What changed:  how much to take how to take this when to take this additional instructions   guaiFENesin 100 MG/5ML liquid Commonly known as: ROBITUSSIN Take 5 mLs by mouth every 4 (four) hours as needed for cough or to loosen phlegm.   HYDROcodone-acetaminophen 5-325 MG tablet Commonly known as: NORCO/VICODIN Take 1 tablet by mouth every 8 (eight) hours as needed for moderate pain or severe pain. What changed:  when to take this reasons to take this   ipratropium 0.02 % nebulizer solution Commonly known as: ATROVENT Take 2.5 mLs (0.5 mg total) by nebulization 2 (two) times daily.   loratadine 10 MG tablet Commonly known as: CLARITIN Take 1 tablet (10 mg total) by  mouth daily.   melatonin 5 MG Tabs Take 2 tablets (10 mg total) by mouth at bedtime. What changed: how much to take   metoprolol succinate 50 MG 24 hr tablet Commonly known as: TOPROL-XL TAKE ONE TABLET BY MOUTH DAILY WITH OR IMMEDIATELY FOLLOWING A MEAL What changed: See the new instructions.   mirtazapine 15 MG tablet Commonly known as: REMERON TAKE ONE TABLET BY MOUTH AT BEDTIME   nystatin 100000 UNIT/ML suspension Commonly known as: MYCOSTATIN Take 5 mLs (500,000 Units total) by mouth 4 (four) times daily for 7 days.   pantoprazole 40 MG tablet Commonly known as: PROTONIX TAKE ONE TABLET BY MOUTH DAILY   pravastatin 40 MG tablet Commonly known as: PRAVACHOL Take 1 tablet (40 mg total) by mouth every evening.   predniSONE 10 MG tablet Commonly known as:  DELTASONE Prednisone dosing: Take  Prednisone '40mg'$  (4 tabs) x 3 days, then taper to '30mg'$  (3 tabs) x 3 days, then '20mg'$  (2 tabs) x 3days, then '10mg'$  (1 tab) x 3days, then OFF. Start taking on: July 15, 2022 What changed: additional instructions   spironolactone 25 MG tablet Commonly known as: ALDACTONE Take 1 tablet (25 mg total) by mouth daily. Start taking on: July 15, 2022               Durable Medical Equipment  (From admission, onward)           Start     Ordered   07/08/22 1414  For home use only DME standard manual wheelchair with seat cushion  Once       Comments: Patient suffers from generalized weaknness which impairs their ability to perform daily activities like bathing, dressing, and toileting in the home.  A cane will not resolve issue with performing activities of daily living. A wheelchair will allow patient to safely perform daily activities. Patient can safely propel the wheelchair in the home or has a caregiver who can provide assistance. Length of need Lifetime. Accessories: elevating leg rests (ELRs), wheel locks, extensions and anti-tippers.   07/08/22 1414            Follow-up Information     Isaac Bliss, Rayford Halsted, MD. Schedule an appointment as soon as possible for a visit in 10 day(s).   Specialty: Internal Medicine Why: for hospital follow-up, obtain labs BMET Contact information: [place] [address] [phone]         Adrian Prows, MD. Schedule an appointment as soon as possible for a visit in 2 week(s).   Specialty: Cardiology Why: for hospital follow-up Contact information: [place] [address] [phone]                Discharge Exam: Danley Danker Weights   07/05/22 1115 07/05/22 1720  Weight: 47.6 kg 46.2 kg   S: Feels good to go home today, no acute nausea or vomiting, shortness of breath no wheezing.  Patient's daughter updated and agreeable with management.   Cleared by cardiology.  Vitals:   07/13/22 2219 07/14/22 0500 07/14/22 0751 07/14/22 0755  BP: (!) 149/79 (!) 160/91    Pulse: 80 82    Resp: 20 18    Temp: 97.9 F (36.6 C) 98 F (36.7 C)    TempSrc:  Oral    SpO2: 100% 98% 100% 100%  Weight:      Height:        Physical Exam General: Alert and oriented x 3, NAD Cardiovascular: S1  S2 clear, RRR.  Respiratory: Diminished breath of the bases however no wheezing today Gastrointestinal: Soft, nontender, nondistended, NBS Ext: no pedal edema bilaterally Neuro: no new deficits Psych: Normal affect pleasant    Condition at discharge: fair  The results of significant diagnostics from this hospitalization (including imaging, microbiology, ancillary and laboratory) are listed below for reference.   Imaging Studies: ECHOCARDIOGRAM COMPLETE  Result Date: 07/12/2022    ECHOCARDIOGRAM REPORT   Patient Name:   KARLE DESROSIER Date of Exam: 07/12/2022 Medical Rec #:  [id number]         Height:       64.0 in Accession #:    [id number]        Weight:       101.9 lb Date of Birth:  March 23, 1946         BSA:          1.469 m Patient Age:    76 years          BP:           141/89 mmHg Patient Gender: F                 HR:           101 bpm. Exam Location:  Inpatient Procedure: 2D Echo, Cardiac Doppler and Color Doppler Indications:    Congestive Heart Failure I50.9  History:        Patient has prior history of Echocardiogram examinations, most                 recent 11/12/2020. COPD and Sarcoidosis; Risk Factors:Hypertension                 and GERD.  Sonographer:    Bernadene Person RDCS Referring Phys: [id number] Cap[address]ran K Euel Castile IMPRESSIONS  1. Left ventricular ejection fraction, by estimation, is 70 to 75%. The left ventricle has hyperdynamic function. The left ventricle has no regional wall motion abnormalities. Left ventricular diastolic parameters are consistent with Grade I diastolic dysfunction (impaired relaxation).  2. Right ventricular systolic function  is normal. The right ventricular size is normal. There is severely elevated pulmonary artery systolic pressure. The estimated right ventricular systolic pressure is 62.1 mmHg.  3. The mitral valve is normal in structure. No evidence of mitral valve regurgitation. No evidence of mitral stenosis.  4. Tricuspid valve regurgitation is severe.  5. The aortic valve is tricuspid. There is mild calcification of the aortic valve. Aortic valve regurgitation is not visualized. Aortic valve sclerosis is present, with no evidence of aortic valve stenosis.  6. The inferior vena cava is normal in size with greater than 50% respiratory variability, suggesting right atrial pressure of 3 mmHg. Comparison(s): No significant change from prior study. Prior images reviewed side by side. FINDINGS  Left Ventricle: Left ventricular ejection fraction, by estimation, is 70 to 75%. The left ventricle has hyperdynamic function. The left ventricle has no regional wall motion abnormalities. The left ventricular internal cavity size was normal in size. There is no left ventricular hypertrophy. Left ventricular diastolic parameters are consistent with Grade I diastolic dysfunction (impaired relaxation). Right Ventricle: The right ventricular size is normal. No increase in right ventricular wall thickness. Right ventricular systolic function is normal. There is severely elevated pulmonary artery systolic pressure. The tricuspid regurgitant velocity is 4.17 m/s, and with an assumed right atrial pressure of 3 mmHg, the estimated right ventricular systolic pressure is 30.8 mmHg. Left Atrium: Left atrial size was normal in size.  Right Atrium: Right atrial size was normal in size. Pericardium: There is no evidence of pericardial effusion. Mitral Valve: The mitral valve is normal in structure. No evidence of mitral valve regurgitation. No evidence of mitral valve stenosis. Tricuspid Valve: The tricuspid valve is normal in structure. Tricuspid valve  regurgitation is severe. No evidence of tricuspid stenosis. Aortic Valve: The aortic valve is tricuspid. There is mild calcification of the aortic valve. Aortic valve regurgitation is not visualized. Aortic valve sclerosis is present, with no evidence of aortic valve stenosis. Pulmonic Valve: The pulmonic valve was normal in structure. Pulmonic valve regurgitation is not visualized. No evidence of pulmonic stenosis. Aorta: The aortic root is normal in size and structure. Venous: The inferior vena cava is normal in size with greater than 50% respiratory variability, suggesting right atrial pressure of 3 mmHg. IAS/Shunts: No atrial level shunt detected by color flow Doppler.  LEFT VENTRICLE PLAX 2D LVIDd:         2.90 cm     Diastology LVIDs:         2.10 cm     LV e' medial:    5.19 cm/s LV PW:         0.70 cm     LV E/e' medial:  18.1 LV IVS:        0.70 cm     LV e' lateral:   5.35 cm/s LVOT diam:     1.90 cm     LV E/e' lateral: 17.5 LV SV:         53 LV SV Index:   36 LVOT Area:     2.84 cm  LV Volumes (MOD) LV vol d, MOD A2C: 70.5 ml LV vol d, MOD A4C: 52.6 ml LV vol s, MOD A2C: 23.9 ml LV vol s, MOD A4C: 16.9 ml LV SV MOD A2C:     46.6 ml LV SV MOD A4C:     52.6 ml LV SV MOD BP:      41.1 ml RIGHT VENTRICLE RV S prime:     9.24 cm/s TAPSE (M-mode): 1.4 cm LEFT ATRIUM           Index        RIGHT ATRIUM          Index LA diam:      2.20 cm 1.50 cm/m   RA Area:     8.98 cm LA Vol (A2C): 26.2 ml 17.84 ml/m  RA Volume:   20.50 ml 13.96 ml/m LA Vol (A4C): 21.9 ml 14.91 ml/m  AORTIC VALVE LVOT Vmax:   94.60 cm/s LVOT Vmean:  62.900 cm/s LVOT VTI:    0.186 m  AORTA Ao Root diam: 2.90 cm Ao Asc diam:  3.10 cm MITRAL VALVE                TRICUSPID VALVE MV Area (PHT): 6.37 cm     TR Peak grad:   69.6 mmHg MV Decel Time: 119 msec     TR Mean grad:   45.0 mmHg MV E velocity: 93.80 cm/s   TR Vmax:        417.00 cm/s MV A velocity: 166.00 cm/s  TR Vmean:       324.0 cm/s MV E/A ratio:  0.57                              SHUNTS  Systemic VTI:  0.19 m                             Systemic Diam: 1.90 cm Candee Furbish MD Electronically signed by Candee Furbish MD Signature Date/Time: 07/12/2022/5:07:02 PM    Final    CT Head Wo Contrast  Result Date: 07/05/2022 CLINICAL DATA:  Delirium.  Altered mental status. EXAM: CT HEAD WITHOUT CONTRAST TECHNIQUE: Contiguous axial images were obtained from the base of the skull through the vertex without intravenous contrast. RADIATION DOSE REDUCTION: This exam was performed according to the departmental dose-optimization program which includes automated exposure control, adjustment of the mA and/or kV according to patient size and/or use of iterative reconstruction technique. COMPARISON:  07/03/2022 FINDINGS: Brain: There is no evidence for acute hemorrhage, hydrocephalus, mass lesion, or abnormal extra-axial fluid collection. No definite CT evidence for acute infarction. Patchy low attenuation in the deep hemispheric and periventricular white matter is nonspecific, but likely reflects chronic microvascular ischemic demyelination. Vascular: No hyperdense vessel or unexpected calcification. Skull: No evidence for fracture. No worrisome lytic or sclerotic lesion. Sinuses/Orbits: The visualized paranasal sinuses and mastoid air cells are clear. Visualized portions of the globes and intraorbital fat are unremarkable. Other: None. IMPRESSION: 1. Stable.  No acute intracranial abnormality. 2. Advanced chronic small vessel ischemic disease. Electronically Signed   By: Misty Stanley M.D.   On: 07/05/2022 12:59   DG Chest Port 1 View  Result Date: 07/05/2022 CLINICAL DATA:  Shortness of breath. EXAM: PORTABLE CHEST 1 VIEW COMPARISON:  June 14, 2022. FINDINGS: Stable cardiomediastinal silhouette. Continued presence of a large number of calcified mediastinal and hilar lymph nodes consistent with sarcoidosis. Continued presence of bilateral lung opacities are noted consistent  with sarcoidosis. However, there is interval development of lobular density seen in the right midlung which may represent focal inflammation or pneumonia. Also noted is mildly angulated distal left clavicular fracture. IMPRESSION: Chronic findings are again noted consistent with history of sarcoidosis. There is interval development of lobular density seen in right midlung which may represent focal inflammation or pneumonia. Radiographic follow-up is recommended to ensure resolution or stability. Also noted is mildly angulated distal left clavicular fracture. Electronically Signed   By: Marijo Conception M.D.   On: 07/05/2022 11:32   CT HEAD WO CONTRAST (5MM)  Result Date: 07/03/2022 CLINICAL DATA:  Head and neck pain post fall. EXAM: CT HEAD WITHOUT CONTRAST CT CERVICAL SPINE WITHOUT CONTRAST TECHNIQUE: Multidetector CT imaging of the head and cervical spine was performed following the standard protocol without intravenous contrast. Multiplanar CT image reconstructions of the cervical spine were also generated. RADIATION DOSE REDUCTION: This exam was performed according to the departmental dose-optimization program which includes automated exposure control, adjustment of the mA and/or kV according to patient size and/or use of iterative reconstruction technique. COMPARISON:  CT examination dated June 14, 2022 FINDINGS: CT HEAD FINDINGS Brain: No evidence of acute infarction, hemorrhage, hydrocephalus, extra-axial collection or mass lesion/mass effect. Advanced chronic microvascular ischemic changes of the white matter, unchanged. Vascular: No hyperdense vessel or unexpected calcification. Skull: Normal. Negative for fracture or focal lesion. Sinuses/Orbits: No acute finding.  Bilateral cataract surgery. Other: None CT CERVICAL SPINE FINDINGS Alignment: Mild anterolisthesis of L2. Mild stepwise retrolisthesis of L3, L4 and L5. Skull base and vertebrae: No acute fracture. No primary bone lesion or focal  pathologic process. Soft tissues and spinal canal: No prevertebral fluid or swelling. No visible canal hematoma. Disc  levels: C2-C3: Bilateral facet joint arthropathy. No significant spinal canal or neural foraminal stenosis. C3-C4: Disc height loss and disc osteophyte complex with spinal canal stenosis. Mild-to-moderate bilateral neural foraminal stenosis. Mild facet joint arthropathy. C4-C5: Disc height loss and disc osteophyte complex with uncovertebral joint arthropathy. Mild right and moderate left neural foraminal stenosis. Mild facet joint arthropathy. C5-C6: Disc height loss and disc osteophyte complex with mild spinal canal stenosis. Mild-to-moderate bilateral neural foraminal stenosis with facet joint arthropathy. C6-C7: Disc osteophyte complex with mild left neural foraminal stenosis. C7-T1: Mild to moderate left facet joint arthropathy. No significant spinal canal or neural foraminal stenosis. Upper chest: Prominent biapical pleural/parenchymal scarring. Other: None IMPRESSION: CT HEAD: 1. No acute intracranial abnormality. 2. Advanced chronic microvascular ischemic changes of the white matter, unchanged. CT CERVICAL SPINE: 1. No acute fracture or traumatic subluxation. 2. Multilevel degenerative disc and joint disease, as described above. 3. Prominent biapical pleural/parenchymal scarring. Electronically Signed   By: Keane Police D.O.   On: 07/03/2022 09:23   CT Cervical Spine Wo Contrast  Result Date: 07/03/2022 CLINICAL DATA:  Head and neck pain post fall. EXAM: CT HEAD WITHOUT CONTRAST CT CERVICAL SPINE WITHOUT CONTRAST TECHNIQUE: Multidetector CT imaging of the head and cervical spine was performed following the standard protocol without intravenous contrast. Multiplanar CT image reconstructions of the cervical spine were also generated. RADIATION DOSE REDUCTION: This exam was performed according to the departmental dose-optimization program which includes automated exposure control, adjustment  of the mA and/or kV according to patient size and/or use of iterative reconstruction technique. COMPARISON:  CT examination dated June 14, 2022 FINDINGS: CT HEAD FINDINGS Brain: No evidence of acute infarction, hemorrhage, hydrocephalus, extra-axial collection or mass lesion/mass effect. Advanced chronic microvascular ischemic changes of the white matter, unchanged. Vascular: No hyperdense vessel or unexpected calcification. Skull: Normal. Negative for fracture or focal lesion. Sinuses/Orbits: No acute finding.  Bilateral cataract surgery. Other: None CT CERVICAL SPINE FINDINGS Alignment: Mild anterolisthesis of L2. Mild stepwise retrolisthesis of L3, L4 and L5. Skull base and vertebrae: No acute fracture. No primary bone lesion or focal pathologic process. Soft tissues and spinal canal: No prevertebral fluid or swelling. No visible canal hematoma. Disc levels: C2-C3: Bilateral facet joint arthropathy. No significant spinal canal or neural foraminal stenosis. C3-C4: Disc height loss and disc osteophyte complex with spinal canal stenosis. Mild-to-moderate bilateral neural foraminal stenosis. Mild facet joint arthropathy. C4-C5: Disc height loss and disc osteophyte complex with uncovertebral joint arthropathy. Mild right and moderate left neural foraminal stenosis. Mild facet joint arthropathy. C5-C6: Disc height loss and disc osteophyte complex with mild spinal canal stenosis. Mild-to-moderate bilateral neural foraminal stenosis with facet joint arthropathy. C6-C7: Disc osteophyte complex with mild left neural foraminal stenosis. C7-T1: Mild to moderate left facet joint arthropathy. No significant spinal canal or neural foraminal stenosis. Upper chest: Prominent biapical pleural/parenchymal scarring. Other: None IMPRESSION: CT HEAD: 1. No acute intracranial abnormality. 2. Advanced chronic microvascular ischemic changes of the white matter, unchanged. CT CERVICAL SPINE: 1. No acute fracture or traumatic  subluxation. 2. Multilevel degenerative disc and joint disease, as described above. 3. Prominent biapical pleural/parenchymal scarring. Electronically Signed   By: Keane Police D.O.   On: 07/03/2022 09:23   DG Shoulder Left  Result Date: 07/03/2022 CLINICAL DATA:  Fall.  Left shoulder pain. EXAM: LEFT SHOULDER - 4 VIEW COMPARISON:  Two-view chest x-ray 06/05/2022 FINDINGS: A comminuted fracture of the distal clavicle is displaced up to 1/2 the shaft with. Glenohumeral joint is intact. No  additional fractures are present. Extensive chronic fibrotic changes are present in the lung. IMPRESSION: Comminuted fracture of the distal clavicle with up to 1/2 the shaft with displacement. Electronically Signed   By: San Morelle M.D.   On: 07/03/2022 09:20   DG Pelvis Portable  Result Date: 07/03/2022 CLINICAL DATA:  Fall. EXAM: PORTABLE PELVIS 1 VIEWS COMPARISON:  One-view pelvis 04/14/2022. FINDINGS: Right hip arthroplasty noted. No acute fractures are present. Mild degenerative changes are again noted at the left hip. Vascular calcifications are present. Calcified uterine fibroid noted. IMPRESSION: 1. No acute abnormality. 2. Right hip arthroplasty without radiographic evidence for complication. Electronically Signed   By: San Morelle M.D.   On: 07/03/2022 09:17   DG Chest Port 1 View  Result Date: 06/14/2022 CLINICAL DATA:  Shortness of breath. Weakness today. History of COPD Clips in the gallbladder fossa postcholecystectomy. No biliary dilatation. Ptosis. EXAM: PORTABLE CHEST 1 VIEW COMPARISON:  Chest radiograph 06/05/2022.  Chest CT 04/14/2022 FINDINGS: Lower lung volumes from prior exam. Stable heart size, normal for technique. Extensive calcified mediastinal and hilar adenopathy related sarcoidosis. New opacity in the periphery of the right upper lobe. There is bilateral hilar retraction and scarring in the upper lung zones, stable. Additional areas of scarring throughout both lungs, also  stable. No pneumothorax or large pleural effusion. IMPRESSION: 1. New opacity in the periphery of the right upper lobe, may be atelectasis or pneumonia. 2. Lower lung volumes from prior exam. 3. Chronic changes related sarcoidosis with multifocal scarring, calcified mediastinal and hilar adenopathy and chronic hilar retraction. Electronically Signed   By: Keith Rake M.D.   On: 06/14/2022 16:05   CT Head Wo Contrast  Result Date: 06/14/2022 CLINICAL DATA:  Weakness and altered mental status. EXAM: CT HEAD WITHOUT CONTRAST TECHNIQUE: Contiguous axial images were obtained from the base of the skull through the vertex without intravenous contrast. RADIATION DOSE REDUCTION: This exam was performed according to the departmental dose-optimization program which includes automated exposure control, adjustment of the mA and/or kV according to patient size and/or use of iterative reconstruction technique. COMPARISON:  Head CT 04/14/2022 FINDINGS: Brain: No intracranial hemorrhage, mass effect, or midline shift. Normal brain volume for age no hydrocephalus. The basilar cisterns are patent. Stable degree of moderate periventricular and deep white matter hypodensity typical of chronic small vessel ischemia. No evidence of territorial infarct or acute ischemia. No extra-axial or intracranial fluid collection. Vascular: Atherosclerosis of skullbase vasculature without hyperdense vessel or abnormal calcification. Skull: No fracture or focal lesion. Sinuses/Orbits: Paranasal sinuses and mastoid air cells are clear. The visualized orbits are unremarkable. Other: None. IMPRESSION: 1. No acute intracranial abnormality. 2. Stable chronic small vessel ischemia. Electronically Signed   By: Keith Rake M.D.   On: 06/14/2022 15:55    Microbiology: Results for orders placed or performed during the hospital encounter of 07/05/22  Resp Panel by RT-PCR (Flu A&B, Covid) Anterior Nasal Swab     Status: None   Collection Time:  07/05/22  2:44 PM   Specimen: Anterior Nasal Swab  Result Value Ref Range Status   SARS Coronavirus 2 by RT PCR NEGATIVE NEGATIVE Final    Comment: (NOTE) SARS-CoV-2 target nucleic acids are NOT DETECTED.  The SARS-CoV-2 RNA is generally detectable in upper respiratory specimens during the acute phase of infection. The lowest concentration of SARS-CoV-2 viral copies this assay can detect is 138 copies/mL. A negative result does not preclude SARS-Cov-2 infection and should not be used as the sole basis for treatment  or other patient management decisions. A negative result may occur with  improper specimen collection/handling, submission of specimen other than nasopharyngeal swab, presence of viral mutation(s) within the areas targeted by this assay, and inadequate number of viral copies(<138 copies/mL). A negative result must be combined with clinical observations, patient history, and epidemiological information. The expected result is Negative.  Fact Sheet for Patients:  EntrepreneurPulse.com.au  Fact Sheet for Healthcare Providers:  IncredibleEmployment.be  This test is no t yet approved or cleared by the [place] FDA and  has been authorized for detection and/or diagnosis of SARS-CoV-2 by FDA under an Emergency Use Authorization (EUA). This EUA will remain  in effect (meaning this test can be used) for the duration of the COVID-19 declaration under Section 564(b)(1) of the Act, 21 U.S.C.section 360bbb-3(b)(1), unless the authorization is terminated  or revoked sooner.       Influenza A by PCR NEGATIVE NEGATIVE Final   Influenza B by PCR NEGATIVE NEGATIVE Final    Comment: (NOTE) The Xpert Xpress SARS-CoV-2/FLU/RSV plus assay is intended as an aid in the diagnosis of influenza from Nasopharyngeal swab specimens and should not be used as a sole basis for treatment. Nasal washings and aspirates are unacceptable for Xpert Xpress  SARS-CoV-2/FLU/RSV testing.  Fact Sheet for Patients: EntrepreneurPulse.com.au  Fact Sheet for Healthcare Providers: IncredibleEmployment.be  This test is not yet approved or cleared by the [place] FDA and has been authorized for detection and/or diagnosis of SARS-CoV-2 by FDA under an Emergency Use Authorization (EUA). This EUA will remain in effect (meaning this test can be used) for the duration of the COVID-19 declaration under Section 564(b)(1) of the Act, 21 U.S.C. section 360bbb-3(b)(1), unless the authorization is terminated or revoked.  Performed at [facility], [place] [address] [place], [place] [address]   MRSA Next Gen by PCR, Nasal     Status: None   Collection Time: 07/05/22  5:38 PM   Specimen: Nasal Mucosa; Nasal Swab  Result Value Ref Range Status   MRSA by PCR Next Gen NOT DETECTED NOT DETECTED Final    Comment: (NOTE) The GeneXpert MRSA Assay (FDA approved for NASAL specimens only), is one component of a comprehensive MRSA colonization surveillance program. It is not intended to diagnose MRSA infection nor to guide or monitor treatment for MRSA infections. Test performance is not FDA approved in patients less than 65 years old. Performed at [facility], [place] [address] [place], [place] [address]     Labs: CBC: Recent Labs  Lab 07/10/22 0522 07/11/22 0527 07/12/22 0543 07/13/22 0507 07/14/22 0425  WBC 7.8 10.9* 10.7* 13.9* 13.2*  HGB 9.5* 9.3* 9.7* 10.6* 10.0*  HCT 31.4* 30.7* 32.1* 34.2* 32.1*  MCV 79.7* 79.9* 80.3 78.8* 78.3*  PLT 396 414* 446* 449* 381*   Basic Metabolic Panel: Recent Labs  Lab 07/10/22 0522 07/11/22 0527 07/12/22 0543 07/13/22 0507 07/14/22 0425  NA 134* 134* 136 136 134*  K 4.0 3.6 3.5 3.2* 3.2*  CL 93* 92* 93* 90* 87*  CO2 32 33* 35* 38* 40*  GLUCOSE 99 91 109* 85 127*  BUN '18 15 23 20 '$ 28*  CREATININE 0.57 0.56 0.91 0.77 0.77   CALCIUM 8.9 8.6* 8.6* 8.5* 8.5*  MG 2.0 1.9 1.8 1.8 1.6*   Liver Function Tests: No results for input(s): "AST", "ALT", "ALKPHOS", "BILITOT", "PROT", "ALBUMIN" in the last 168 hours. CBG: No results for input(s): "GLUCAP" in the last 168 hours.  Discharge time spent: greater  than 30 minutes.  Signed: Estill Cotta, MD [facility] 07/14/2022

## 2022-07-15 ENCOUNTER — Telehealth: Payer: Self-pay

## 2022-07-15 DIAGNOSIS — D869 Sarcoidosis, unspecified: Secondary | ICD-10-CM | POA: Diagnosis not present

## 2022-07-15 DIAGNOSIS — R0602 Shortness of breath: Secondary | ICD-10-CM | POA: Diagnosis not present

## 2022-07-15 NOTE — Patient Outreach (Signed)
  Care Coordination TOC Note Transition Care Management Follow-up Telephone Call Date of discharge and from where: 07/14/22-[facility] Dx: "acute on chronic resp failure" How have you been since you were released from the hospital? Call completed with daughter/caregiver who voices she handles all of patient's medical affairs. She reports patient is doing well -not currently at home with her. States that they had a "real smooth night last night." Any questions or concerns? No  Items Reviewed: Did the pt receive and understand the discharge instructions provided? Yes  Medications obtained and verified?  Unable to verify as daughter currently at work Other? Yes  Any new allergies since your discharge? No  Dietary orders reviewed? Yes Do you have support at home? Yes   Home Care and Equipment/Supplies: Were home health services ordered? yes If so, what is the name of the agency? [facility]  Has the agency set up a time to come to the patient's home? no Were any new equipment or medical supplies ordered?  Yes: wheelchair What is the name of the medical supply agency? [employer] Were you able to get the supplies/equipment? yes Do you have any questions related to the use of the equipment or supplies? No  Functional Questionnaire: (I = Independent and D = Dependent) ADLs: A  Bathing/Dressing- A  Meal Prep- A  Eating- I  Maintaining continence- I  Transferring/Ambulation- I  Managing Meds- A  Follow up appointments reviewed:  PCP Hospital f/u appt confirmed?  Patient already had PCP appt on 07/29/2022 prior to admission. Daughter will contact office to see if appt pt needs to be seen sooner.  [facility] f/u appt confirmed? No  Daughter will call today to make follow up cardiology appt. Are transportation arrangements needed? No  If their condition worsens, is the pt aware to call PCP or go to the Emergency Dept.? Yes Was the patient provided with contact information for  the PCP's office or ED? Yes Was to pt encouraged to call back with questions or concerns? Yes  SDOH assessments and interventions completed:   Yes SDOH Interventions Today    Flowsheet Row Most Recent Value  SDOH Interventions   Food Insecurity Interventions Intervention Not Indicated  Transportation Interventions Intervention Not Indicated       Care Coordination Interventions:  Education provided    Encounter Outcome:  Pt. Visit Completed     Enzo Montgomery, RN,BSN,CCM [facility] Telephonic Care Management Coordinator Direct Phone: [phone] Toll Free: [phone] Fax: [phone]

## 2022-07-17 DIAGNOSIS — J8410 Pulmonary fibrosis, unspecified: Secondary | ICD-10-CM | POA: Diagnosis not present

## 2022-07-17 DIAGNOSIS — J690 Pneumonitis due to inhalation of food and vomit: Secondary | ICD-10-CM | POA: Diagnosis not present

## 2022-07-17 DIAGNOSIS — J439 Emphysema, unspecified: Secondary | ICD-10-CM | POA: Diagnosis not present

## 2022-07-17 DIAGNOSIS — J9611 Chronic respiratory failure with hypoxia: Secondary | ICD-10-CM | POA: Diagnosis not present

## 2022-07-17 DIAGNOSIS — A419 Sepsis, unspecified organism: Secondary | ICD-10-CM | POA: Diagnosis not present

## 2022-07-21 DIAGNOSIS — J9611 Chronic respiratory failure with hypoxia: Secondary | ICD-10-CM | POA: Diagnosis not present

## 2022-07-21 DIAGNOSIS — E871 Hypo-osmolality and hyponatremia: Secondary | ICD-10-CM | POA: Diagnosis not present

## 2022-07-21 DIAGNOSIS — Z7952 Long term (current) use of systemic steroids: Secondary | ICD-10-CM | POA: Diagnosis not present

## 2022-07-21 DIAGNOSIS — J439 Emphysema, unspecified: Secondary | ICD-10-CM | POA: Diagnosis not present

## 2022-07-21 DIAGNOSIS — J441 Chronic obstructive pulmonary disease with (acute) exacerbation: Secondary | ICD-10-CM | POA: Diagnosis not present

## 2022-07-21 DIAGNOSIS — E876 Hypokalemia: Secondary | ICD-10-CM | POA: Diagnosis not present

## 2022-07-21 DIAGNOSIS — M810 Age-related osteoporosis without current pathological fracture: Secondary | ICD-10-CM | POA: Diagnosis not present

## 2022-07-21 DIAGNOSIS — I10 Essential (primary) hypertension: Secondary | ICD-10-CM | POA: Diagnosis not present

## 2022-07-21 DIAGNOSIS — E440 Moderate protein-calorie malnutrition: Secondary | ICD-10-CM | POA: Diagnosis not present

## 2022-07-21 DIAGNOSIS — J690 Pneumonitis due to inhalation of food and vomit: Secondary | ICD-10-CM | POA: Diagnosis not present

## 2022-07-21 DIAGNOSIS — K219 Gastro-esophageal reflux disease without esophagitis: Secondary | ICD-10-CM | POA: Diagnosis not present

## 2022-07-21 DIAGNOSIS — E875 Hyperkalemia: Secondary | ICD-10-CM | POA: Diagnosis not present

## 2022-07-21 DIAGNOSIS — Z7951 Long term (current) use of inhaled steroids: Secondary | ICD-10-CM | POA: Diagnosis not present

## 2022-07-21 DIAGNOSIS — G319 Degenerative disease of nervous system, unspecified: Secondary | ICD-10-CM | POA: Diagnosis not present

## 2022-07-21 DIAGNOSIS — I471 Supraventricular tachycardia, unspecified: Secondary | ICD-10-CM | POA: Diagnosis not present

## 2022-07-21 DIAGNOSIS — E785 Hyperlipidemia, unspecified: Secondary | ICD-10-CM | POA: Diagnosis not present

## 2022-07-21 DIAGNOSIS — G9341 Metabolic encephalopathy: Secondary | ICD-10-CM | POA: Diagnosis not present

## 2022-07-21 DIAGNOSIS — E559 Vitamin D deficiency, unspecified: Secondary | ICD-10-CM | POA: Diagnosis not present

## 2022-07-21 DIAGNOSIS — F32A Depression, unspecified: Secondary | ICD-10-CM | POA: Diagnosis not present

## 2022-07-21 DIAGNOSIS — J8410 Pulmonary fibrosis, unspecified: Secondary | ICD-10-CM | POA: Diagnosis not present

## 2022-07-21 DIAGNOSIS — H5462 Unqualified visual loss, left eye, normal vision right eye: Secondary | ICD-10-CM | POA: Diagnosis not present

## 2022-07-21 DIAGNOSIS — H269 Unspecified cataract: Secondary | ICD-10-CM | POA: Diagnosis not present

## 2022-07-21 DIAGNOSIS — D860 Sarcoidosis of lung: Secondary | ICD-10-CM | POA: Diagnosis not present

## 2022-07-21 DIAGNOSIS — J9622 Acute and chronic respiratory failure with hypercapnia: Secondary | ICD-10-CM | POA: Diagnosis not present

## 2022-07-22 ENCOUNTER — Other Ambulatory Visit: Payer: Self-pay | Admitting: Internal Medicine

## 2022-07-22 MED ORDER — GABAPENTIN 100 MG PO CAPS: 100.0000 mg | ORAL_CAPSULE | Freq: Two times a day (BID) | ORAL | 1 refills | Status: AC

## 2022-07-22 NOTE — Telephone Encounter (Signed)
Last filled by Laurin Coder, MD

## 2022-07-23 ENCOUNTER — Telehealth: Payer: Self-pay | Admitting: Internal Medicine

## 2022-07-23 NOTE — Telephone Encounter (Signed)
Anda Kraft with [facility] [phone]   Verbal Order:  OT eval for next week

## 2022-07-23 NOTE — Telephone Encounter (Signed)
Verbal orders given to [employer]

## 2022-07-24 ENCOUNTER — Other Ambulatory Visit: Payer: Self-pay | Admitting: *Deleted

## 2022-07-24 DIAGNOSIS — J439 Emphysema, unspecified: Secondary | ICD-10-CM | POA: Diagnosis not present

## 2022-07-24 DIAGNOSIS — J441 Chronic obstructive pulmonary disease with (acute) exacerbation: Secondary | ICD-10-CM | POA: Diagnosis not present

## 2022-07-24 DIAGNOSIS — E559 Vitamin D deficiency, unspecified: Secondary | ICD-10-CM | POA: Diagnosis not present

## 2022-07-24 DIAGNOSIS — F32A Depression, unspecified: Secondary | ICD-10-CM | POA: Diagnosis not present

## 2022-07-24 DIAGNOSIS — I471 Supraventricular tachycardia, unspecified: Secondary | ICD-10-CM | POA: Diagnosis not present

## 2022-07-24 DIAGNOSIS — H269 Unspecified cataract: Secondary | ICD-10-CM | POA: Diagnosis not present

## 2022-07-24 DIAGNOSIS — G319 Degenerative disease of nervous system, unspecified: Secondary | ICD-10-CM | POA: Diagnosis not present

## 2022-07-24 DIAGNOSIS — K219 Gastro-esophageal reflux disease without esophagitis: Secondary | ICD-10-CM | POA: Diagnosis not present

## 2022-07-24 DIAGNOSIS — E876 Hypokalemia: Secondary | ICD-10-CM | POA: Diagnosis not present

## 2022-07-24 DIAGNOSIS — I10 Essential (primary) hypertension: Secondary | ICD-10-CM | POA: Diagnosis not present

## 2022-07-24 DIAGNOSIS — Z7952 Long term (current) use of systemic steroids: Secondary | ICD-10-CM | POA: Diagnosis not present

## 2022-07-24 DIAGNOSIS — D860 Sarcoidosis of lung: Secondary | ICD-10-CM | POA: Diagnosis not present

## 2022-07-24 DIAGNOSIS — H5462 Unqualified visual loss, left eye, normal vision right eye: Secondary | ICD-10-CM | POA: Diagnosis not present

## 2022-07-24 DIAGNOSIS — J690 Pneumonitis due to inhalation of food and vomit: Secondary | ICD-10-CM | POA: Diagnosis not present

## 2022-07-24 DIAGNOSIS — Z7951 Long term (current) use of inhaled steroids: Secondary | ICD-10-CM | POA: Diagnosis not present

## 2022-07-24 DIAGNOSIS — E875 Hyperkalemia: Secondary | ICD-10-CM | POA: Diagnosis not present

## 2022-07-24 DIAGNOSIS — E785 Hyperlipidemia, unspecified: Secondary | ICD-10-CM | POA: Diagnosis not present

## 2022-07-24 DIAGNOSIS — E871 Hypo-osmolality and hyponatremia: Secondary | ICD-10-CM | POA: Diagnosis not present

## 2022-07-24 DIAGNOSIS — M810 Age-related osteoporosis without current pathological fracture: Secondary | ICD-10-CM | POA: Diagnosis not present

## 2022-07-24 DIAGNOSIS — J9611 Chronic respiratory failure with hypoxia: Secondary | ICD-10-CM | POA: Diagnosis not present

## 2022-07-24 DIAGNOSIS — J8410 Pulmonary fibrosis, unspecified: Secondary | ICD-10-CM | POA: Diagnosis not present

## 2022-07-24 DIAGNOSIS — J9622 Acute and chronic respiratory failure with hypercapnia: Secondary | ICD-10-CM | POA: Diagnosis not present

## 2022-07-24 DIAGNOSIS — E440 Moderate protein-calorie malnutrition: Secondary | ICD-10-CM | POA: Diagnosis not present

## 2022-07-24 DIAGNOSIS — G9341 Metabolic encephalopathy: Secondary | ICD-10-CM | POA: Diagnosis not present

## 2022-07-24 MED ORDER — PANTOPRAZOLE SODIUM 40 MG PO TBEC: 40.0000 mg | DELAYED_RELEASE_TABLET | Freq: Every day | ORAL | 1 refills | Status: AC

## 2022-07-28 ENCOUNTER — Encounter (HOSPITAL_COMMUNITY): Payer: Self-pay

## 2022-07-28 ENCOUNTER — Ambulatory Visit: Payer: Medicare Other | Admitting: Internal Medicine

## 2022-07-28 ENCOUNTER — Emergency Department (HOSPITAL_COMMUNITY): Payer: Medicare Other

## 2022-07-28 ENCOUNTER — Other Ambulatory Visit: Payer: Self-pay

## 2022-07-28 ENCOUNTER — Inpatient Hospital Stay (HOSPITAL_COMMUNITY)
Admission: EM | Admit: 2022-07-28 | Discharge: 2022-08-11 | DRG: 190 | Disposition: E | Payer: Medicare Other | Attending: Internal Medicine | Admitting: Internal Medicine

## 2022-07-28 DIAGNOSIS — Z888 Allergy status to other drugs, medicaments and biological substances status: Secondary | ICD-10-CM

## 2022-07-28 DIAGNOSIS — E871 Hypo-osmolality and hyponatremia: Secondary | ICD-10-CM | POA: Diagnosis present

## 2022-07-28 DIAGNOSIS — Z7952 Long term (current) use of systemic steroids: Secondary | ICD-10-CM | POA: Diagnosis not present

## 2022-07-28 DIAGNOSIS — E785 Hyperlipidemia, unspecified: Secondary | ICD-10-CM | POA: Diagnosis not present

## 2022-07-28 DIAGNOSIS — Z515 Encounter for palliative care: Secondary | ICD-10-CM | POA: Diagnosis not present

## 2022-07-28 DIAGNOSIS — Z8249 Family history of ischemic heart disease and other diseases of the circulatory system: Secondary | ICD-10-CM

## 2022-07-28 DIAGNOSIS — E43 Unspecified severe protein-calorie malnutrition: Secondary | ICD-10-CM | POA: Diagnosis present

## 2022-07-28 DIAGNOSIS — I5033 Acute on chronic diastolic (congestive) heart failure: Secondary | ICD-10-CM | POA: Diagnosis not present

## 2022-07-28 DIAGNOSIS — E861 Hypovolemia: Secondary | ICD-10-CM | POA: Diagnosis present

## 2022-07-28 DIAGNOSIS — Z66 Do not resuscitate: Secondary | ICD-10-CM | POA: Diagnosis not present

## 2022-07-28 DIAGNOSIS — M810 Age-related osteoporosis without current pathological fracture: Secondary | ICD-10-CM | POA: Diagnosis present

## 2022-07-28 DIAGNOSIS — Z1152 Encounter for screening for COVID-19: Secondary | ICD-10-CM

## 2022-07-28 DIAGNOSIS — R0602 Shortness of breath: Secondary | ICD-10-CM | POA: Diagnosis not present

## 2022-07-28 DIAGNOSIS — Z7951 Long term (current) use of inhaled steroids: Secondary | ICD-10-CM

## 2022-07-28 DIAGNOSIS — R451 Restlessness and agitation: Secondary | ICD-10-CM | POA: Diagnosis not present

## 2022-07-28 DIAGNOSIS — D860 Sarcoidosis of lung: Secondary | ICD-10-CM | POA: Diagnosis not present

## 2022-07-28 DIAGNOSIS — Z79899 Other long term (current) drug therapy: Secondary | ICD-10-CM | POA: Diagnosis not present

## 2022-07-28 DIAGNOSIS — H269 Unspecified cataract: Secondary | ICD-10-CM | POA: Diagnosis not present

## 2022-07-28 DIAGNOSIS — R0600 Dyspnea, unspecified: Secondary | ICD-10-CM | POA: Diagnosis not present

## 2022-07-28 DIAGNOSIS — Z825 Family history of asthma and other chronic lower respiratory diseases: Secondary | ICD-10-CM

## 2022-07-28 DIAGNOSIS — Z9981 Dependence on supplemental oxygen: Secondary | ICD-10-CM

## 2022-07-28 DIAGNOSIS — Z823 Family history of stroke: Secondary | ICD-10-CM

## 2022-07-28 DIAGNOSIS — Z803 Family history of malignant neoplasm of breast: Secondary | ICD-10-CM

## 2022-07-28 DIAGNOSIS — H5462 Unqualified visual loss, left eye, normal vision right eye: Secondary | ICD-10-CM | POA: Diagnosis present

## 2022-07-28 DIAGNOSIS — I10 Essential (primary) hypertension: Secondary | ICD-10-CM | POA: Diagnosis not present

## 2022-07-28 DIAGNOSIS — F32A Depression, unspecified: Secondary | ICD-10-CM | POA: Diagnosis not present

## 2022-07-28 DIAGNOSIS — J9611 Chronic respiratory failure with hypoxia: Secondary | ICD-10-CM | POA: Diagnosis not present

## 2022-07-28 DIAGNOSIS — I4719 Other supraventricular tachycardia: Secondary | ICD-10-CM | POA: Diagnosis present

## 2022-07-28 DIAGNOSIS — I471 Supraventricular tachycardia, unspecified: Secondary | ICD-10-CM | POA: Diagnosis present

## 2022-07-28 DIAGNOSIS — E559 Vitamin D deficiency, unspecified: Secondary | ICD-10-CM | POA: Diagnosis present

## 2022-07-28 DIAGNOSIS — J441 Chronic obstructive pulmonary disease with (acute) exacerbation: Principal | ICD-10-CM | POA: Diagnosis present

## 2022-07-28 DIAGNOSIS — F0394 Unspecified dementia, unspecified severity, with anxiety: Secondary | ICD-10-CM | POA: Diagnosis not present

## 2022-07-28 DIAGNOSIS — J439 Emphysema, unspecified: Secondary | ICD-10-CM | POA: Diagnosis not present

## 2022-07-28 DIAGNOSIS — Z7189 Other specified counseling: Secondary | ICD-10-CM | POA: Diagnosis not present

## 2022-07-28 DIAGNOSIS — E876 Hypokalemia: Secondary | ICD-10-CM | POA: Diagnosis not present

## 2022-07-28 DIAGNOSIS — D869 Sarcoidosis, unspecified: Secondary | ICD-10-CM | POA: Diagnosis present

## 2022-07-28 DIAGNOSIS — G9341 Metabolic encephalopathy: Secondary | ICD-10-CM | POA: Diagnosis present

## 2022-07-28 DIAGNOSIS — Z87891 Personal history of nicotine dependence: Secondary | ICD-10-CM | POA: Diagnosis not present

## 2022-07-28 DIAGNOSIS — R4589 Other symptoms and signs involving emotional state: Secondary | ICD-10-CM

## 2022-07-28 DIAGNOSIS — J9622 Acute and chronic respiratory failure with hypercapnia: Secondary | ICD-10-CM | POA: Diagnosis present

## 2022-07-28 DIAGNOSIS — J90 Pleural effusion, not elsewhere classified: Secondary | ICD-10-CM | POA: Diagnosis not present

## 2022-07-28 DIAGNOSIS — Z743 Need for continuous supervision: Secondary | ICD-10-CM | POA: Diagnosis not present

## 2022-07-28 DIAGNOSIS — K219 Gastro-esophageal reflux disease without esophagitis: Secondary | ICD-10-CM | POA: Diagnosis present

## 2022-07-28 DIAGNOSIS — E440 Moderate protein-calorie malnutrition: Secondary | ICD-10-CM | POA: Diagnosis not present

## 2022-07-28 DIAGNOSIS — R918 Other nonspecific abnormal finding of lung field: Secondary | ICD-10-CM | POA: Diagnosis not present

## 2022-07-28 DIAGNOSIS — Z681 Body mass index (BMI) 19 or less, adult: Secondary | ICD-10-CM | POA: Diagnosis not present

## 2022-07-28 DIAGNOSIS — R059 Cough, unspecified: Secondary | ICD-10-CM | POA: Diagnosis not present

## 2022-07-28 DIAGNOSIS — I110 Hypertensive heart disease with heart failure: Secondary | ICD-10-CM | POA: Diagnosis not present

## 2022-07-28 DIAGNOSIS — I2729 Other secondary pulmonary hypertension: Secondary | ICD-10-CM | POA: Diagnosis present

## 2022-07-28 DIAGNOSIS — I071 Rheumatic tricuspid insufficiency: Secondary | ICD-10-CM | POA: Diagnosis present

## 2022-07-28 DIAGNOSIS — Z833 Family history of diabetes mellitus: Secondary | ICD-10-CM

## 2022-07-28 DIAGNOSIS — Z886 Allergy status to analgesic agent status: Secondary | ICD-10-CM

## 2022-07-28 DIAGNOSIS — J9621 Acute and chronic respiratory failure with hypoxia: Secondary | ICD-10-CM | POA: Diagnosis not present

## 2022-07-28 DIAGNOSIS — J690 Pneumonitis due to inhalation of food and vomit: Secondary | ICD-10-CM | POA: Diagnosis not present

## 2022-07-28 DIAGNOSIS — G319 Degenerative disease of nervous system, unspecified: Secondary | ICD-10-CM | POA: Diagnosis not present

## 2022-07-28 DIAGNOSIS — E875 Hyperkalemia: Secondary | ICD-10-CM | POA: Diagnosis not present

## 2022-07-28 DIAGNOSIS — J8410 Pulmonary fibrosis, unspecified: Secondary | ICD-10-CM | POA: Diagnosis not present

## 2022-07-28 DIAGNOSIS — Z880 Allergy status to penicillin: Secondary | ICD-10-CM

## 2022-07-28 DIAGNOSIS — Z96641 Presence of right artificial hip joint: Secondary | ICD-10-CM | POA: Diagnosis present

## 2022-07-28 LAB — COMPREHENSIVE METABOLIC PANEL
ALT: 14 U/L (ref 0–44)
AST: 19 U/L (ref 15–41)
Albumin: 3 g/dL — ABNORMAL LOW (ref 3.5–5.0)
Alkaline Phosphatase: 72 U/L (ref 38–126)
Anion gap: 6 (ref 5–15)
BUN: 19 mg/dL (ref 8–23)
CO2: 34 mmol/L — ABNORMAL HIGH (ref 22–32)
Calcium: 8.9 mg/dL (ref 8.9–10.3)
Chloride: 89 mmol/L — ABNORMAL LOW (ref 98–111)
Creatinine, Ser: 1.02 mg/dL — ABNORMAL HIGH (ref 0.44–1.00)
GFR, Estimated: 57 mL/min — ABNORMAL LOW (ref >60)
Glucose, Bld: 96 mg/dL (ref 70–99)
Potassium: 4.9 mmol/L (ref 3.5–5.1)
Sodium: 129 mmol/L — ABNORMAL LOW (ref 135–145)
Total Bilirubin: 0.6 mg/dL (ref 0.3–1.2)
Total Protein: 7.7 g/dL (ref 6.5–8.1)

## 2022-07-28 LAB — CBC
HCT: 32.5 % — ABNORMAL LOW (ref 36.0–46.0)
Hemoglobin: 9.7 g/dL — ABNORMAL LOW (ref 12.0–15.0)
MCH: 23.9 pg — ABNORMAL LOW (ref 26.0–34.0)
MCHC: 29.8 g/dL — ABNORMAL LOW (ref 30.0–36.0)
MCV: 80 fL (ref 80.0–100.0)
Platelets: 189 10*3/uL (ref 150–400)
RBC: 4.06 MIL/uL (ref 3.87–5.11)
RDW: 18.6 % — ABNORMAL HIGH (ref 11.5–15.5)
WBC: 8.3 10*3/uL (ref 4.0–10.5)
nRBC: 0 % (ref 0.0–0.2)

## 2022-07-28 LAB — BRAIN NATRIURETIC PEPTIDE: B Natriuretic Peptide: 1258.6 pg/mL — ABNORMAL HIGH (ref 0.0–100.0)

## 2022-07-28 LAB — RESP PANEL BY RT-PCR (RSV, FLU A&B, COVID)  RVPGX2
Influenza A by PCR: NEGATIVE
Influenza B by PCR: NEGATIVE
Resp Syncytial Virus by PCR: NEGATIVE
SARS Coronavirus 2 by RT PCR: NEGATIVE

## 2022-07-28 MED ORDER — SPIRONOLACTONE 25 MG PO TABS
25.0000 mg | ORAL_TABLET | Freq: Every day | ORAL | Status: DC
  Administered 2022-07-28 – 2022-07-29 (×2): 25 mg via ORAL
  Filled 2022-07-28 (×2): qty 1

## 2022-07-28 MED ORDER — ONDANSETRON HCL 4 MG PO TABS: 4.0000 mg | ORAL_TABLET | Freq: Four times a day (QID) | ORAL | Status: DC | PRN

## 2022-07-28 MED ORDER — MAGNESIUM SULFATE 2 GM/50ML IV SOLN
2.0000 g | Freq: Once | INTRAVENOUS | Status: AC
  Administered 2022-07-28: 2 g via INTRAVENOUS
  Filled 2022-07-28: qty 50

## 2022-07-28 MED ORDER — MELATONIN 5 MG PO TABS
5.0000 mg | ORAL_TABLET | Freq: Every day | ORAL | Status: DC
  Administered 2022-07-28 – 2022-07-29 (×2): 5 mg via ORAL
  Filled 2022-07-28 (×2): qty 1

## 2022-07-28 MED ORDER — HYDROCODONE-ACETAMINOPHEN 5-325 MG PO TABS
1.0000 | ORAL_TABLET | Freq: Three times a day (TID) | ORAL | Status: DC | PRN
  Administered 2022-07-29 (×2): 1 via ORAL
  Filled 2022-07-28 (×2): qty 1

## 2022-07-28 MED ORDER — ACETAMINOPHEN 325 MG PO TABS: 650.0000 mg | ORAL_TABLET | Freq: Four times a day (QID) | ORAL | Status: DC | PRN

## 2022-07-28 MED ORDER — PRAVASTATIN SODIUM 20 MG PO TABS
40.0000 mg | ORAL_TABLET | Freq: Every evening | ORAL | Status: DC
  Administered 2022-07-28 – 2022-07-29 (×2): 40 mg via ORAL
  Filled 2022-07-28 (×2): qty 2

## 2022-07-28 MED ORDER — IPRATROPIUM-ALBUTEROL 0.5-2.5 (3) MG/3ML IN SOLN
3.0000 mL | Freq: Four times a day (QID) | RESPIRATORY_TRACT | Status: DC
  Administered 2022-07-28 – 2022-07-29 (×2): 3 mL via RESPIRATORY_TRACT
  Filled 2022-07-28 (×2): qty 3

## 2022-07-28 MED ORDER — GABAPENTIN 100 MG PO CAPS
100.0000 mg | ORAL_CAPSULE | Freq: Two times a day (BID) | ORAL | Status: DC
  Administered 2022-07-28 – 2022-07-29 (×3): 100 mg via ORAL
  Filled 2022-07-28 (×3): qty 1

## 2022-07-28 MED ORDER — ALBUTEROL SULFATE (2.5 MG/3ML) 0.083% IN NEBU
5.0000 mg | INHALATION_SOLUTION | Freq: Once | RESPIRATORY_TRACT | Status: AC
  Administered 2022-07-28: 5 mg via RESPIRATORY_TRACT
  Filled 2022-07-28: qty 6

## 2022-07-28 MED ORDER — PANTOPRAZOLE SODIUM 40 MG PO TBEC
40.0000 mg | DELAYED_RELEASE_TABLET | Freq: Every day | ORAL | Status: DC
  Administered 2022-07-28 – 2022-07-29 (×2): 40 mg via ORAL
  Filled 2022-07-28 (×2): qty 1

## 2022-07-28 MED ORDER — ACETAMINOPHEN 650 MG RE SUPP: 650.0000 mg | Freq: Four times a day (QID) | RECTAL | Status: DC | PRN

## 2022-07-28 MED ORDER — PREDNISONE 20 MG PO TABS
40.0000 mg | ORAL_TABLET | Freq: Every day | ORAL | Status: DC
  Administered 2022-07-29: 40 mg via ORAL
  Filled 2022-07-28: qty 2

## 2022-07-28 MED ORDER — FUROSEMIDE 10 MG/ML IJ SOLN
40.0000 mg | Freq: Once | INTRAMUSCULAR | Status: AC
  Administered 2022-07-28: 40 mg via INTRAVENOUS
  Filled 2022-07-28: qty 4

## 2022-07-28 MED ORDER — ONDANSETRON HCL 4 MG/2ML IJ SOLN: 4.0000 mg | Freq: Four times a day (QID) | INTRAMUSCULAR | Status: DC | PRN

## 2022-07-28 MED ORDER — MIRTAZAPINE 15 MG PO TABS
15.0000 mg | ORAL_TABLET | Freq: Every day | ORAL | Status: DC
  Administered 2022-07-28 – 2022-07-29 (×2): 15 mg via ORAL
  Filled 2022-07-28 (×2): qty 1

## 2022-07-28 MED ORDER — METOPROLOL SUCCINATE ER 25 MG PO TB24
50.0000 mg | ORAL_TABLET | Freq: Every day | ORAL | Status: DC
  Administered 2022-07-28 – 2022-07-29 (×2): 50 mg via ORAL
  Filled 2022-07-28 (×2): qty 1

## 2022-07-28 MED ORDER — FUROSEMIDE 10 MG/ML IJ SOLN
20.0000 mg | Freq: Two times a day (BID) | INTRAMUSCULAR | Status: DC
  Administered 2022-07-29 (×2): 20 mg via INTRAVENOUS
  Filled 2022-07-28 (×2): qty 2

## 2022-07-28 MED ORDER — METHYLPREDNISOLONE SODIUM SUCC 125 MG IJ SOLR
125.0000 mg | Freq: Once | INTRAMUSCULAR | Status: AC
  Administered 2022-07-28: 125 mg via INTRAVENOUS
  Filled 2022-07-28: qty 2

## 2022-07-28 MED ORDER — FAMOTIDINE 20 MG PO TABS
20.0000 mg | ORAL_TABLET | Freq: Every day | ORAL | Status: DC
  Administered 2022-07-28 – 2022-07-29 (×2): 20 mg via ORAL
  Filled 2022-07-28 (×2): qty 1

## 2022-07-28 NOTE — H&P (Signed)
History and Physical    Patient: [name] DOB: 10-Apr-1946 DOA: 07/19/2022 DOS: the patient was seen and examined on 08/06/2022 PCP: Isaac Bliss, Rayford Halsted, MD  Patient coming from: Home  Chief Complaint:  Chief Complaint  Patient presents with   Shortness of Breath   HPI: [name] is a 76 y.o. female with medical history significant of left eye blindness, cataracts of both eyes, COPD with emphysema, pulmonary fibrosis, sarcoidosis, chronic respiratory failure with hypoxia and hypercapnia on home oxygen, depression, hypertension, GERD, osteoporosis, recurrent dislocation of the right hip, vitamin D deficiency who is coming to the emergency department due to shortness of breath associated with wheezing, occasionally productive cough of whitish sputum, chest pressure and fatigue.  No palpitations, dizziness, PND, orthopnea or lower extremity edema.  She has been drinking plenty of fluids.  She also uses salt.  No fever, chills or night sweats. No sore throat, rhinorrhea, dyspnea, wheezing or hemoptysis. No appetite changes, abdominal pain, diarrhea, constipation, melena or hematochezia.  No flank pain, dysuria, frequency or hematuria.  No polyuria, polydipsia, polyphagia or blurred vision.  ED course: Initial vital signs were temperature 97.8 F, pulse 92, respiration 24, BP 129/68 mmHg O2 sat 100% on room air.  The patient received furosemide 40 mg IVP, methylprednisolone 125 mg IVP and a 5 mg albuterol DuoNeb.  I added magnesium sulfate 2 g IVPB.  Lab work: CBC 0 white count of 8.4, hemoglobin 9.7 g/dL platelets 189.  Coronavirus, RSV and influenza PCR negative.  BNP was 1258.6 pg/mL.  CMP showed a sodium 129, potassium 4.9, chloride 89 and CO2 34 mmol/L.  Creatinine was 1.02 mg/dL and albumin 3.0 g/dL.  The rest of the CMP measurements were normal.  Imaging: Portable 1 view chest radiograph with persisting opacities within the right upper lobe and right lower  lobe compatible with atelectasis and/or pneumonia.  Stable small right pleural effusion.  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  Stable calcified mediastinal and hilar lymph nodes.   Review of Systems: As mentioned in the history of present illness. All other systems reviewed and are negative. Past Medical History:  Diagnosis Date   Blind left eye    Cataract of both eyes 01/18/2009   Right surgically repaired   COPD with emphysema ([place] ) 02/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   Depression 06/08/2007   Essential hypertension, benign 06/08/2007   Family history of breast cancer    GERD 01/18/2009   Osteoporosis 12/31/2012   Left femur had T score -2.5 on DEXA scan 02/26/2009.    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   Vitamin D deficiency    Past Surgical History:  Procedure Laterality Date   CATARACT EXTRACTION Right    COLONOSCOPY  2002   HIP ARTHROPLASTY Right    HIP CLOSED REDUCTION Right 04/08/2021   Procedure: CLOSED REDUCTION HIP;  Surgeon: Mcarthur Rossetti, MD;  Location: [facility];  Service: Orthopedics;  Laterality: Right;   HIP CLOSED REDUCTION Right 09/15/2021   Procedure: CLOSED REDUCTION HIP;  Surgeon: Melina Schools, MD;  Location: [facility];  Service: Orthopedics;  Laterality: Right;   TUBAL LIGATION     Social History:  reports that she quit smoking about 12 years ago. Her smoking use included cigarettes. She has a 7.50 pack-year smoking history.  She has never used smokeless tobacco. She reports that she does not currently use alcohol. She reports that she does not use drugs.  Allergies  Allergen Reactions   Ace Inhibitors Swelling   Lisinopril Swelling    REACTION: Angioedema   Valsartan Swelling    REACTION: angioedema.  Pt should not get ACEI or ARBs of any kind due to angioedema.    Aspirin Other (See Comments)    REACTION: GI ulcer.   Penicillins     Childhood allergy Has patient had a PCN reaction causing immediate rash, facial/tongue/throat swelling, SOB or lightheadedness with hypotension: Unknown Has patient had a PCN reaction causing severe rash involving mucus membranes or skin necrosis: Unknown Has patient had a PCN reaction that required hospitalization: Unknown Has patient had a PCN reaction occurring within the last 10 years: Unknown If all of the above answers are "NO", then may proceed with Cephalosporin use.     Family History  Problem Relation Age of Onset   Heart disease Mother    Breast cancer Mother 47   Stroke Father    Heart disease Sister    Asthma Daughter    Cancer Other    Diabetes Other    Hypertension Other    COPD Brother        "9/11 survivor"   COPD Sister        "9/11 survivor"   Breast cancer Maternal Grandmother        dx in her 78s   Breast cancer Other        MGMs mother died in her 29s   Colon cancer Neg Hx     Prior to Admission medications   Medication Sig Start Date End Date Taking? Authorizing Provider  acetaminophen (TYLENOL) 500 MG tablet Take 1,000 mg by mouth every 6 (six) hours as needed for moderate pain.    [provider]  albuterol (PROVENTIL) (2.5 MG/3ML) 0.083% nebulizer solution Inhale 3 mLs (2.5 mg total) by nebulization every 6 (six) hours as needed for wheezing. 04/17/22   Danford, Suann Larry, MD  albuterol (VENTOLIN HFA) 108 (90 Base) MCG/ACT inhaler INHALE TWO PUFFS EVERY 6 HOURS AS NEEDED FOR WHEEZING Patient taking differently: Inhale 2 puffs into the lungs every 6 (six) hours as needed for wheezing. INHALE TWO PUFFS EVERY 6 HOURS AS NEEDED FOR WHEEZING 04/02/22   Isaac Bliss, Rayford Halsted, MD  amLODipine (NORVASC) 10 MG tablet Take 1 tablet (10 mg total) by mouth daily. 07/14/22   Rai, Ripudeep K, MD  famotidine (PEPCID) 20 MG tablet TAKE ONE TABLET BY MOUTH TWICE DAILY Patient taking  differently: Take 20 mg by mouth 2 (two) times daily. 01/01/22   Isaac Bliss, Rayford Halsted, MD  fluticasone-salmeterol [facility] INHUB) 500-50 MCG/ACT AEPB Inhale 1 puff into the lungs in the morning and at bedtime. 07/14/22   Rai, Vernelle Emerald, MD  furosemide (LASIX) 20 MG tablet Take 1 tablet (20 mg total) by mouth daily as needed for fluid or edema (shortness of breath). 07/14/22   Rai, Vernelle Emerald, MD  gabapentin (NEURONTIN) 100 MG capsule Take 1 capsule (100 mg total) by mouth 2 (two) times daily. TAKE ONE CAPSULE BY MOUTH THREE TIMES DAILY 07/22/22   Isaac Bliss, Rayford Halsted, MD  guaiFENesin (ROBITUSSIN) 100 MG/5ML liquid Take 5 mLs by mouth every 4 (four) hours as needed for cough or to loosen phlegm. 07/14/22   Rai, Vernelle Emerald, MD  HYDROcodone-acetaminophen (NORCO/VICODIN) 5-325 MG tablet Take 1 tablet by mouth every 8 (eight)  hours as needed for moderate pain or severe pain. 07/14/22   Rai, Ripudeep K, MD  ipratropium (ATROVENT) 0.02 % nebulizer solution Take 2.5 mLs (0.5 mg total) by nebulization 2 (two) times daily. 06/05/22   Parrett, Fonnie Mu, NP  loratadine (CLARITIN) 10 MG tablet Take 1 tablet (10 mg total) by mouth daily. Patient not taking: Reported on 07/06/2022 10/22/21   Isaac Bliss, Rayford Halsted, MD  melatonin 5 MG TABS Take 1 tablet (5 mg total) by mouth at bedtime. 07/22/22   Isaac Bliss, Rayford Halsted, MD  metoprolol succinate (TOPROL-XL) 50 MG 24 hr tablet TAKE ONE TABLET BY MOUTH DAILY WITH OR IMMEDIATELY FOLLOWING A MEAL 07/07/22   Isaac Bliss, Rayford Halsted, MD  mirtazapine (REMERON) 15 MG tablet TAKE ONE TABLET BY MOUTH AT BEDTIME Patient taking differently: Take 15 mg by mouth at bedtime. 11/28/21   Isaac Bliss, Rayford Halsted, MD  pantoprazole (PROTONIX) 40 MG tablet Take 1 tablet (40 mg total) by mouth daily. 07/24/22   Isaac Bliss, Rayford Halsted, MD  pravastatin (PRAVACHOL) 40 MG tablet Take 1 tablet (40 mg total) by mouth every evening. 02/06/22   Isaac Bliss, Rayford Halsted, MD   predniSONE (DELTASONE) 10 MG tablet Prednisone dosing: Take  Prednisone '40mg'$  (4 tabs) x 3 days, then taper to '30mg'$  (3 tabs) x 3 days, then '20mg'$  (2 tabs) x 3days, then '10mg'$  (1 tab) x 3days, then OFF. 07/15/22   Rai, Ripudeep K, MD  spironolactone (ALDACTONE) 25 MG tablet Take 1 tablet (25 mg total) by mouth daily. 07/15/22   Mendel Corning, MD    Physical Exam: Vitals:   07/26/2022 1230 07/17/2022 1300 08/10/2022 1330 08/03/2022 1445  BP: 135/66 117/60 (!) 111/52 131/69  Pulse: 94 96 95 97  Resp: (!) 30 (!) 29 (!) 28 (!) 21  Temp:      SpO2: 99% 100% 100% 94%  Weight:      Height:       Physical Exam Vitals and nursing note reviewed.  Constitutional:      General: She is awake. She is not in acute distress. HENT:     Head: Normocephalic.     Nose: No rhinorrhea.     Mouth/Throat:     Mouth: Mucous membranes are moist.  Eyes:     General: No scleral icterus.    Pupils: Pupils are equal, round, and reactive to light.  Neck:     Vascular: No JVD.  Cardiovascular:     Rate and Rhythm: Normal rate and regular rhythm.     Heart sounds: S1 normal and S2 normal.  Pulmonary:     Effort: Pulmonary effort is normal.     Breath sounds: Examination of the right-lower field reveals rales. Examination of the left-lower field reveals rales. Decreased breath sounds, wheezing and rales present. No rhonchi.  Abdominal:     General: Bowel sounds are normal.     Palpations: Abdomen is soft.     Tenderness: There is no abdominal tenderness.  Musculoskeletal:     Cervical back: Neck supple.     Right lower leg: No edema.     Left lower leg: No edema.  Skin:    General: Skin is warm and dry.  Neurological:     General: No focal deficit present.     Mental Status: She is alert and oriented to person, place, and time.  Psychiatric:        Mood and Affect: Mood normal.  Behavior: Behavior normal. Behavior is cooperative.    Data Reviewed:  Results are pending, will review when  available.  07/12/2022 echocardiogram IMPRESSIONS    1. Left ventricular ejection fraction, by estimation, is 70 to 75%. The  left ventricle has hyperdynamic function. The left ventricle has no  regional wall motion abnormalities. Left ventricular diastolic parameters  are consistent with Grade I diastolic  dysfunction (impaired relaxation).   2. Right ventricular systolic function is normal. The right ventricular  size is normal. There is severely elevated pulmonary artery systolic  pressure. The estimated right ventricular systolic pressure is 38.8 mmHg.   3. The mitral valve is normal in structure. No evidence of mitral valve  regurgitation. No evidence of mitral stenosis.   4. Tricuspid valve regurgitation is severe.   5. The aortic valve is tricuspid. There is mild calcification of the  aortic valve. Aortic valve regurgitation is not visualized. Aortic valve  sclerosis is present, with no evidence of aortic valve stenosis.   6. The inferior vena cava is normal in size with greater than 50%  respiratory variability, suggesting right atrial pressure of 3 mmHg.   EKG: Vent. rate 94 BPM PR interval 135 ms QRS duration 92 ms QT/QTcB 356/446 ms P-R-T axes 67 -10 35 Sinus rhythm Multiple premature complexes, vent & supraven Consider right atrial enlargement Baseline wander in lead(s) V1  Assessment and Plan: Principal Problem:   Acute on chronic respiratory failure with hypoxia and hypercapnia (HCC) Secondary to:   Pulmonary fibrosis (HCC)   COPD with acute exacerbation (HCC)   Acute on chronic diastolic congestive heart failure (HCC)  Observation/telemetry. Supplemental oxygen as needed. Sodium and fluid restriction. Continue furosemide 20 mg IVP twice daily. Monitor daily weights, intake and output. Monitor renal function electrolytes. Echocardiogram recently done. Feels better after significant diuresis. Will resume metoprolol later this evening.  Active Problems:    GERD Continue pantoprazole 40 mg p.o. daily.    Dyslipidemia Continue pravastatin 40 mg p.o. daily.    Hyponatremia In the setting of hypovolemic state. Continue treatment for heart failure as above.    Paroxysmal SVT (supraventricular tachycardia) Continue metoprolol succinate 50 mg p.o. daily. Continue cardiac telemetry. Keep electrolytes optimized.    Advance Care Planning:   Code Status: Full Code   Consults:   Family Communication:   Severity of Illness: The appropriate patient status for this patient is OBSERVATION. Observation status is judged to be reasonable and necessary in order to provide the required intensity of service to ensure the patient's safety. The patient's presenting symptoms, physical exam findings, and initial radiographic and laboratory data in the context of their medical condition is felt to place them at decreased risk for further clinical deterioration. Furthermore, it is anticipated that the patient will be medically stable for discharge from the hospital within 2 midnights of admission.   Author: Reubin Milan, MD 08/10/2022 3:17 PM  For on call review [email or web address].   This document was prepared using Dragon voice recognition software and may contain some unintended transcription errors.

## 2022-07-28 NOTE — ED Triage Notes (Signed)
Per EMS- patient is normally on O2 3L/min via [place]. Patient pulled her O2 off during th enight and when the home health aide came to see the patient, her Sats on room air were-70%-80%. Fire department had the patient on a NRB-15L and EMS placed the patient on O2 4L/min via [place] and sats dropped to 89%. Patient was placed back on the NRB-15L/min.  Patient has a history of COPD and Fibrosis.

## 2022-07-28 NOTE — ED Provider Notes (Signed)
[facility] DEPT Provider Note   CSN: [id number] Arrival date & time: 07/20/2022  1105     History  Chief Complaint  Patient presents with   Shortness of Breath    [name] is a 76 y.o. female.  HPI 76 year old female history of respiratory failure, COPD, on chronic oxygen, history of pulmonary fibrosis, history of pulmonary hypertension and CHF presents today with reports of hypoxia.  Reports she has had some ongoing cough although she feels that this is chronic, she has had some change in the type of sputum produced over the past month.She reports increased dyspnea.    Reportedly had oxygen off and sats were in the 80s today.  She presents here via EMS started on nonrebreather.  Home Medications Prior to Admission medications   Medication Sig Start Date End Date Taking? Authorizing Provider  acetaminophen (TYLENOL) 500 MG tablet Take 1,000 mg by mouth every 6 (six) hours as needed for moderate pain.    [provider]  albuterol (PROVENTIL) (2.5 MG/3ML) 0.083% nebulizer solution Inhale 3 mLs (2.5 mg total) by nebulization every 6 (six) hours as needed for wheezing. 04/17/22   Danford, Suann Larry, MD  albuterol (VENTOLIN HFA) 108 (90 Base) MCG/ACT inhaler INHALE TWO PUFFS EVERY 6 HOURS AS NEEDED FOR WHEEZING Patient taking differently: Inhale 2 puffs into the lungs every 6 (six) hours as needed for wheezing. INHALE TWO PUFFS EVERY 6 HOURS AS NEEDED FOR WHEEZING 04/02/22   Isaac Bliss, Rayford Halsted, MD  amLODipine (NORVASC) 10 MG tablet Take 1 tablet (10 mg total) by mouth daily. 07/14/22   Rai, Ripudeep K, MD  famotidine (PEPCID) 20 MG tablet TAKE ONE TABLET BY MOUTH TWICE DAILY Patient taking differently: Take 20 mg by mouth 2 (two) times daily. 01/01/22   Isaac Bliss, Rayford Halsted, MD  fluticasone-salmeterol [facility] INHUB) 500-50 MCG/ACT AEPB Inhale 1 puff into the lungs in the morning and at bedtime. 07/14/22   Rai, Vernelle Emerald, MD   furosemide (LASIX) 20 MG tablet Take 1 tablet (20 mg total) by mouth daily as needed for fluid or edema (shortness of breath). 07/14/22   Rai, Vernelle Emerald, MD  gabapentin (NEURONTIN) 100 MG capsule Take 1 capsule (100 mg total) by mouth 2 (two) times daily. TAKE ONE CAPSULE BY MOUTH THREE TIMES DAILY 07/22/22   Isaac Bliss, Rayford Halsted, MD  guaiFENesin (ROBITUSSIN) 100 MG/5ML liquid Take 5 mLs by mouth every 4 (four) hours as needed for cough or to loosen phlegm. 07/14/22   Rai, Vernelle Emerald, MD  HYDROcodone-acetaminophen (NORCO/VICODIN) 5-325 MG tablet Take 1 tablet by mouth every 8 (eight) hours as needed for moderate pain or severe pain. 07/14/22   Rai, Ripudeep K, MD  ipratropium (ATROVENT) 0.02 % nebulizer solution Take 2.5 mLs (0.5 mg total) by nebulization 2 (two) times daily. 06/05/22   Parrett, Fonnie Mu, NP  loratadine (CLARITIN) 10 MG tablet Take 1 tablet (10 mg total) by mouth daily. Patient not taking: Reported on 07/06/2022 10/22/21   Isaac Bliss, Rayford Halsted, MD  melatonin 5 MG TABS Take 1 tablet (5 mg total) by mouth at bedtime. 07/22/22   Isaac Bliss, Rayford Halsted, MD  metoprolol succinate (TOPROL-XL) 50 MG 24 hr tablet TAKE ONE TABLET BY MOUTH DAILY WITH OR IMMEDIATELY FOLLOWING A MEAL 07/07/22   Isaac Bliss, Rayford Halsted, MD  mirtazapine (REMERON) 15 MG tablet TAKE ONE TABLET BY MOUTH AT BEDTIME Patient taking differently: Take 15 mg by mouth at bedtime. 11/28/21   Jerilee Hoh  Everardo Beals, MD  pantoprazole (PROTONIX) 40 MG tablet Take 1 tablet (40 mg total) by mouth daily. 07/24/22   Isaac Bliss, Rayford Halsted, MD  pravastatin (PRAVACHOL) 40 MG tablet Take 1 tablet (40 mg total) by mouth every evening. 02/06/22   Isaac Bliss, Rayford Halsted, MD  predniSONE (DELTASONE) 10 MG tablet Prednisone dosing: Take  Prednisone '40mg'$  (4 tabs) x 3 days, then taper to '30mg'$  (3 tabs) x 3 days, then '20mg'$  (2 tabs) x 3days, then '10mg'$  (1 tab) x 3days, then OFF. 07/15/22   Rai, Ripudeep K, MD   spironolactone (ALDACTONE) 25 MG tablet Take 1 tablet (25 mg total) by mouth daily. 07/15/22   Rai, Vernelle Emerald, MD      Allergies    Ace inhibitors, Lisinopril, Valsartan, Aspirin, and Penicillins    Review of Systems   Review of Systems  Physical Exam Updated Vital Signs BP 126/65 (BP Location: Right Arm)   Pulse (!) 102   Temp 98.1 F (36.7 C) (Oral)   Resp 20   Ht 1.626 m ('5\' 4"'$ )   Wt 46.2 kg   SpO2 90%   BMI 17.48 kg/m  Physical Exam Vitals and nursing note reviewed.  Constitutional:      General: She is in acute distress.     Appearance: She is well-developed.  HENT:     Head: Normocephalic and atraumatic.     Mouth/Throat:     Mouth: Mucous membranes are moist.  Eyes:     Pupils: Pupils are equal, round, and reactive to light.  Cardiovascular:     Rate and Rhythm: Regular rhythm. Tachycardia present.  Pulmonary:     Effort: Tachypnea present.     Breath sounds: Examination of the right-upper field reveals wheezing. Examination of the left-upper field reveals wheezing. Examination of the right-middle field reveals wheezing. Examination of the left-middle field reveals wheezing. Examination of the right-lower field reveals wheezing. Examination of the left-lower field reveals wheezing. Wheezing present.  Musculoskeletal:        General: Normal range of motion.     Cervical back: Normal range of motion.  Skin:    General: Skin is warm.     Capillary Refill: Capillary refill takes less than 2 seconds.  Neurological:     Mental Status: She is alert.     ED Results / Procedures / Treatments   Labs (all labs ordered are listed, but only abnormal results are displayed) Labs Reviewed  CBC - Abnormal; Notable for the following components:      Result Value   Hemoglobin 9.7 (*)    HCT 32.5 (*)    MCH 23.9 (*)    MCHC 29.8 (*)    RDW 18.6 (*)    All other components within normal limits  COMPREHENSIVE METABOLIC PANEL - Abnormal; Notable for the following  components:   Sodium 129 (*)    Chloride 89 (*)    CO2 34 (*)    Creatinine, Ser 1.02 (*)    Albumin 3.0 (*)    GFR, Estimated 57 (*)    All other components within normal limits  BRAIN NATRIURETIC PEPTIDE - Abnormal; Notable for the following components:   B Natriuretic Peptide 1,258.6 (*)    All other components within normal limits  RESP PANEL BY RT-PCR (RSV, FLU A&B, COVID)  [id number]    EKG EKG Interpretation  Date/Time:  Monday July 28 2022 12:18:00 EST Ventricular Rate:  94 PR Interval:  135 QRS Duration: 92 QT Interval:  356 QTC Calculation: 446 R Axis:   -10 Text Interpretation: Sinus rhythm Multiple premature complexes, vent & supraven Consider right atrial enlargement Baseline wander in lead(s) V1 Confirmed by Pattricia Boss [phone]) on 07/17/2022 12:47:20 PM  Radiology DG Chest Port 1 View  Result Date: 07/20/2022 CLINICAL DATA:  Cough and dyspnea. EXAM: PORTABLE CHEST 1 VIEW COMPARISON:  Chest radiograph from 07/05/2022 and 06/14/2022. FINDINGS: Stable cardiomediastinal contours. Extensive calcified mediastinal and hilar lymph nodes are again noted. 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 Persistent opacities within the right upper lobe and right lower lobe are noted compared with 07/05/2022. These are new compared with 06/14/2022. Small right pleural effusion is again noted and appears unchanged. IMPRESSION: 1. Persistent opacities within the right upper lobe and right lower lobe compatible with atelectasis and/or pneumonia. 2. Stable small right pleural effusion. 3. 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 4. Stable calcified mediastinal and hilar lymph nodes. Electronically Signed   By: Kerby Moors M.D.   On: 08/09/2022 12:33    Procedures Procedures    Medications Ordered in ED Medications  predniSONE (DELTASONE) tablet 40 mg  (has no administration in time range)  furosemide (LASIX) injection 20 mg (has no administration in time range)  acetaminophen (TYLENOL) tablet 650 mg (has no administration in time range)    Or  acetaminophen (TYLENOL) suppository 650 mg (has no administration in time range)  ondansetron (ZOFRAN) tablet 4 mg (has no administration in time range)    Or  ondansetron (ZOFRAN) injection 4 mg (has no administration in time range)  ipratropium-albuterol (DUONEB) 0.5-2.5 (3) MG/3ML nebulizer solution 3 mL (has no administration in time range)  magnesium sulfate IVPB 2 g 50 mL (has no administration in time range)  methylPREDNISolone sodium succinate (SOLU-MEDROL) 125 mg/2 mL injection 125 mg (125 mg Intravenous Given 07/11/2022 1233)  albuterol (PROVENTIL) (2.5 MG/3ML) 0.083% nebulizer solution 5 mg (5 mg Nebulization Given 07/27/2022 1225)  furosemide (LASIX) injection 40 mg (40 mg Intravenous Given 08/10/2022 1513)    ED Course/ Medical Decision Making/ A&P Clinical Course as of 07/23/2022 1659  Mon Jul 28, 2022  1247 Chest x-Nikolis Berent reviewed interpreted significant for infiltrates and bilateral lungs with associated chronic lung findings radiologist interpretation notes persistent opacities in the right upper lobe and right lower lobe compatible with atelectasis and or pneumonia stable small right pleural effusion, severe 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 and stable calcified mediastinal hilar lymph nodes [DR]  3662 Complete metabolic panel reviewed interpreted significant for mild hyponatremia with sodium 129, CO2 increased at 34 creatinine 1.02  [DR]  1439 B Natriuretic Peptide(!): 1,258.6 BNP reviewed interpreted significant for elevation at 1258 [DR]  1439 CBC reviewed interpreted significant for anemia with hemoglobin of 9.7 [DR]    Clinical Course User Index [DR] Pattricia Boss, MD                           Medical Decision Making 76 yo female with ho copd, chf presents  today with   Amount and/or Complexity of Data Reviewed Labs: ordered. Decision-making details documented in ED Course. Radiology: ordered.  Risk Prescription drug management. Decision regarding hospitalization.           Final Clinical Impression(s) / ED Diagnoses Final diagnoses:  None    Rx / DC Orders ED Discharge Orders     None  Pattricia Boss, MD 07/19/2022 1700

## 2022-07-29 DIAGNOSIS — J8410 Pulmonary fibrosis, unspecified: Secondary | ICD-10-CM | POA: Diagnosis present

## 2022-07-29 DIAGNOSIS — Z681 Body mass index (BMI) 19 or less, adult: Secondary | ICD-10-CM | POA: Diagnosis not present

## 2022-07-29 DIAGNOSIS — Z7189 Other specified counseling: Secondary | ICD-10-CM | POA: Diagnosis not present

## 2022-07-29 DIAGNOSIS — R0600 Dyspnea, unspecified: Secondary | ICD-10-CM | POA: Diagnosis not present

## 2022-07-29 DIAGNOSIS — I4719 Other supraventricular tachycardia: Secondary | ICD-10-CM | POA: Diagnosis present

## 2022-07-29 DIAGNOSIS — E785 Hyperlipidemia, unspecified: Secondary | ICD-10-CM | POA: Diagnosis present

## 2022-07-29 DIAGNOSIS — Z87891 Personal history of nicotine dependence: Secondary | ICD-10-CM | POA: Diagnosis not present

## 2022-07-29 DIAGNOSIS — K219 Gastro-esophageal reflux disease without esophagitis: Secondary | ICD-10-CM | POA: Diagnosis present

## 2022-07-29 DIAGNOSIS — J441 Chronic obstructive pulmonary disease with (acute) exacerbation: Secondary | ICD-10-CM | POA: Diagnosis present

## 2022-07-29 DIAGNOSIS — R918 Other nonspecific abnormal finding of lung field: Secondary | ICD-10-CM | POA: Diagnosis not present

## 2022-07-29 DIAGNOSIS — Z515 Encounter for palliative care: Secondary | ICD-10-CM | POA: Diagnosis not present

## 2022-07-29 DIAGNOSIS — F0394 Unspecified dementia, unspecified severity, with anxiety: Secondary | ICD-10-CM | POA: Diagnosis present

## 2022-07-29 DIAGNOSIS — I2729 Other secondary pulmonary hypertension: Secondary | ICD-10-CM | POA: Diagnosis present

## 2022-07-29 DIAGNOSIS — J439 Emphysema, unspecified: Secondary | ICD-10-CM | POA: Diagnosis present

## 2022-07-29 DIAGNOSIS — Z66 Do not resuscitate: Secondary | ICD-10-CM | POA: Diagnosis present

## 2022-07-29 DIAGNOSIS — Z9981 Dependence on supplemental oxygen: Secondary | ICD-10-CM | POA: Diagnosis not present

## 2022-07-29 DIAGNOSIS — E871 Hypo-osmolality and hyponatremia: Secondary | ICD-10-CM | POA: Diagnosis present

## 2022-07-29 DIAGNOSIS — Z8249 Family history of ischemic heart disease and other diseases of the circulatory system: Secondary | ICD-10-CM | POA: Diagnosis not present

## 2022-07-29 DIAGNOSIS — I071 Rheumatic tricuspid insufficiency: Secondary | ICD-10-CM | POA: Diagnosis present

## 2022-07-29 DIAGNOSIS — Z79899 Other long term (current) drug therapy: Secondary | ICD-10-CM | POA: Diagnosis not present

## 2022-07-29 DIAGNOSIS — Z1152 Encounter for screening for COVID-19: Secondary | ICD-10-CM | POA: Diagnosis not present

## 2022-07-29 DIAGNOSIS — R451 Restlessness and agitation: Secondary | ICD-10-CM | POA: Diagnosis not present

## 2022-07-29 DIAGNOSIS — I5033 Acute on chronic diastolic (congestive) heart failure: Secondary | ICD-10-CM | POA: Diagnosis present

## 2022-07-29 DIAGNOSIS — J9622 Acute and chronic respiratory failure with hypercapnia: Secondary | ICD-10-CM | POA: Diagnosis present

## 2022-07-29 DIAGNOSIS — I110 Hypertensive heart disease with heart failure: Secondary | ICD-10-CM | POA: Diagnosis present

## 2022-07-29 DIAGNOSIS — J9621 Acute and chronic respiratory failure with hypoxia: Secondary | ICD-10-CM | POA: Diagnosis present

## 2022-07-29 DIAGNOSIS — R0602 Shortness of breath: Secondary | ICD-10-CM | POA: Diagnosis present

## 2022-07-29 DIAGNOSIS — G9341 Metabolic encephalopathy: Secondary | ICD-10-CM | POA: Diagnosis present

## 2022-07-29 DIAGNOSIS — E43 Unspecified severe protein-calorie malnutrition: Secondary | ICD-10-CM | POA: Diagnosis present

## 2022-07-29 LAB — COMPREHENSIVE METABOLIC PANEL
ALT: 21 U/L (ref 0–44)
AST: 27 U/L (ref 15–41)
Albumin: 3.1 g/dL — ABNORMAL LOW (ref 3.5–5.0)
Alkaline Phosphatase: 77 U/L (ref 38–126)
Anion gap: 11 (ref 5–15)
BUN: 33 mg/dL — ABNORMAL HIGH (ref 8–23)
CO2: 32 mmol/L (ref 22–32)
Calcium: 8.4 mg/dL — ABNORMAL LOW (ref 8.9–10.3)
Chloride: 89 mmol/L — ABNORMAL LOW (ref 98–111)
Creatinine, Ser: 1.07 mg/dL — ABNORMAL HIGH (ref 0.44–1.00)
GFR, Estimated: 54 mL/min — ABNORMAL LOW (ref >60)
Glucose, Bld: 126 mg/dL — ABNORMAL HIGH (ref 70–99)
Potassium: 4.8 mmol/L (ref 3.5–5.1)
Sodium: 132 mmol/L — ABNORMAL LOW (ref 135–145)
Total Bilirubin: 0.6 mg/dL (ref 0.3–1.2)
Total Protein: 7.2 g/dL (ref 6.5–8.1)

## 2022-07-29 LAB — CBC
HCT: 29.8 % — ABNORMAL LOW (ref 36.0–46.0)
Hemoglobin: 9.1 g/dL — ABNORMAL LOW (ref 12.0–15.0)
MCH: 24.1 pg — ABNORMAL LOW (ref 26.0–34.0)
MCHC: 30.5 g/dL (ref 30.0–36.0)
MCV: 78.8 fL — ABNORMAL LOW (ref 80.0–100.0)
Platelets: 177 10*3/uL (ref 150–400)
RBC: 3.78 MIL/uL — ABNORMAL LOW (ref 3.87–5.11)
RDW: 18.6 % — ABNORMAL HIGH (ref 11.5–15.5)
WBC: 4.6 10*3/uL (ref 4.0–10.5)
nRBC: 0 % (ref 0.0–0.2)

## 2022-07-29 MED ORDER — IPRATROPIUM-ALBUTEROL 0.5-2.5 (3) MG/3ML IN SOLN
3.0000 mL | Freq: Two times a day (BID) | RESPIRATORY_TRACT | Status: DC
  Administered 2022-07-29 – 2022-07-30 (×2): 3 mL via RESPIRATORY_TRACT
  Filled 2022-07-29 (×2): qty 3

## 2022-07-29 MED ORDER — OXYCODONE HCL 5 MG PO TABS
5.0000 mg | ORAL_TABLET | ORAL | Status: DC | PRN
  Administered 2022-07-29 (×2): 5 mg via ORAL
  Filled 2022-07-29 (×2): qty 1

## 2022-07-29 MED ORDER — MORPHINE SULFATE (PF) 2 MG/ML IV SOLN: 2.0000 mg | INTRAVENOUS | Status: DC | PRN

## 2022-07-29 MED ORDER — AZITHROMYCIN 250 MG PO TABS
500.0000 mg | ORAL_TABLET | Freq: Every day | ORAL | Status: DC
  Administered 2022-07-29: 500 mg via ORAL
  Filled 2022-07-29: qty 2

## 2022-07-29 MED ORDER — ALBUTEROL SULFATE (2.5 MG/3ML) 0.083% IN NEBU: 2.5000 mg | INHALATION_SOLUTION | RESPIRATORY_TRACT | Status: DC | PRN

## 2022-07-29 NOTE — Consult Note (Signed)
Consultation Note Date: 07/29/2022   Patient Name: [name]  DOB: 1946/05/08  MRN: [id number]  Age / Sex: 76 y.o., female   PCP: [name], Hayley Halsted, MD Referring Physician: Barb Merino, MD  Reason for Consultation: Establishing goals of care     Chief Complaint/History of Present Illness:   Patient is a 76 year old female with a past medical history of left eye blindness, COPD with emphysema, pulmonary fibrosis, sarcoidosis, depression, hypertension, GERD, and multiple valvular insufficiencies who was admitted on 08/03/2022 for management of shortness of breath associated with wheezing and difficulty breathing.  Patient has had multiple recurrent hospitalizations and admissions for hypoxia management.  During hospitalization patient has received therapies for COPD management and fluid overload.  Palliative medicine team consulted to discuss complex medical decision making.  Extensive review of EMR prior to seeing patient.  Presented to bedside in the afternoon.  Introduced myself to patient as a provider on the palliative medicine team and our role in patient's care.  Spent extensive time hearing about patient's medical journey and the worsening of her lung disease.  Patient knows that her lungs are "not good" and are not going to get better.  Tried to explore what brings patient joy and what she wants to focus on with her time moving forward.  Patient wants to spend quality time at home with her family doing things she enjoys such as crossword puzzles.  Patient does not continue to find benefit from rehospitalization's and finds them quite burdensome.  Patient able to share stories about her life and what a good life she has had.  She noted her husband died 1 week before their 50th anniversary.  She noted they were high school sweetheart's and she got married at the age of 70.  She noted she has had great children and grandchildren and she has lived a good life.  Patient  notes she is excepting of when it is her time to go" God calls her home".  Based on our discussion, we discussed how to best support her goals for medical care moving forward such as focusing on care at home and making sure she is comfortable and not in pain and dyspneic.  Discussed the idea of hospice.  Patient noted that her daughter had mentioned hospice as well and patient pictured it as " killing friends" previously.  Patient acknowledges that was a long time ago and that hospice has changed.  We discussed the things they can focus on and allowing good quality time at home.  Patient noted she had been considering discussing it especially once she goes home.  Encouraged conversations at this time.  We also discussed symptom management.  Patient's dyspnea and pain are uncontrolled.  We discussed how opioids can manage not only pain but dyspnea which is the primary concern for the patient.  Discussed adjusting medications to oxycodone 5 mg every 4 hours as needed.  Patient has received Norco 5 mg x 2 in the past 24 hours.  Discussed that if patient is needing multiple doses of short acting medication to assist with dyspnea management, could start her on long-acting medication which she likely needs with her severe end-stage lung disease.  Patient agreeing with this plan.  Also able to address CODE STATUS with patient.  Discussed that patient is currently full code and what that entails.  Patient noted that she would never want her life prolonged on machines and she would not want chest compressions or resuscitation.  Again if she  is sick enough that her heart stops or she stops breathing, she knows that is her time to go be with God.  Discussed changing CODE STATUS to DNR which patient agrees with.  Inquired if patient wanted me to reach out to inform her daughter, [name], of our discussions and patient agreed with this.  Greatly enjoyed visit with patient today and noted palliative medicine team will  continue to follow along.  Able to call patient's daughter, [name], after visit.  Reduced myself and the role of the palliative medicine team in patient's care.  Updated her about the conversations I had had with patient.  [name] very much supports her mother being DNR and agrees with supporting her mother's choice is to focus on symptom management.  [name] noted that she had discussed potentially involving hospice care with her mother.  Noted she was talking to a "[facility] liaison" named [name] about hospice.  Provided [name] with my contact information and asked her to have a lives with call me as [name] apparently is planning a meeting with family to discuss hospice moving forward.  Would greatly appreciate being involved in this conversation as a member of the palliative medicine team.  Spent time answering questions and allowing for emotional support.  Patient lives with [name] and so [name] is grateful to continue conversations about support moving forward.  [name] also noted that she is "preparing herself" and family about importance of time spent with patient due to the patient's severe lung disease.  Acknowledged this and thanked [name] for all that she does for her mother.  Updated care team including hospitalist and bedside RN after visit with patient.  Primary Diagnoses  Present on  Admission:  Acute on chronic respiratory failure with hypoxia and hypercapnia (HCC)  Pulmonary fibrosis (HCC)  Hyponatremia  GERD  COPD with acute exacerbation (HCC)  Dyslipidemia  Paroxysmal SVT (supraventricular tachycardia)  Acute on chronic diastolic congestive heart failure (HCC)   Palliative Review of Systems: Dyspnea in the setting of severe end-stage lung disease.  Pain in left lower extremity denies adverse effects from receiving Norco previously during the day.  Past Medical History:  Diagnosis Date   Blind left eye    Cataract of both eyes 01/18/2009   Right surgically repaired   COPD with emphysema ([place]) 02/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   Depression 06/08/2007   Essential hypertension, benign 06/08/2007   Family history of breast cancer    GERD 01/18/2009   Osteoporosis 12/31/2012   Left femur had T score -2.5 on DEXA scan 02/26/2009.    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   Vitamin D deficiency    Social History   Socioeconomic History   Marital status: Widowed    Spouse name: Not on file   Number of children: Not on file   Years of education: Not on file   Highest education level: Not on file  Occupational History   Not on file  Tobacco Use   Smoking status: Former    Packs/day: 0.25    Years: 30.00    Total pack years: 7.50    Types: Cigarettes    Quit date: 05/11/2010    Years since quitting: 12.2   Smokeless tobacco: Never  Vaping Use   Vaping Use: Never used  Substance and Sexual Activity   Alcohol use: Not Currently    Comment: wine cooler.   Drug use: No   Sexual activity: Not on file  Other Topics Concern   Not on file  Social History Narrative   Married with 4 children. Retired, previously worked as a Scientist, clinical (histocompatibility and immunogenetics).   Social Determinants of Health   Financial Resource  Strain: Not on file  Food Insecurity: No Food Insecurity (07/31/2022)   Hunger Vital Sign    Worried About Running Out of Food in the Last Year: Never true    Ran Out of Food in the Last Year: Never true  Transportation Needs: No Transportation Needs (07/15/2022)   PRAPARE - Hydrologist (Medical): No    Lack of Transportation (Non-Medical): No  Physical Activity: Not on file  Stress: Not on file  Social Connections: Not on file   Family History  Problem Relation Age of Onset   Heart disease Mother    Breast cancer Mother 16   Stroke Father    Heart disease Sister    Asthma Daughter    Cancer Other    Diabetes Other    Hypertension Other    COPD Brother        "9/11 survivor"   COPD Sister        "9/11 survivor"   Breast cancer Maternal Grandmother        dx in her 46s   Breast cancer Other        MGMs mother died in her 47s   Colon cancer Neg Hx  Scheduled Meds:  azithromycin  500 mg Oral Daily   famotidine  20 mg Oral QHS   furosemide  20 mg Intravenous BID   gabapentin  100 mg Oral BID   ipratropium-albuterol  3 mL Nebulization BID   melatonin  5 mg Oral QHS   metoprolol succinate  50 mg Oral Daily   mirtazapine  15 mg Oral QHS   pantoprazole  40 mg Oral Daily   pravastatin  40 mg Oral QPM   predniSONE  40 mg Oral Q breakfast   spironolactone  25 mg Oral Daily   Continuous Infusions: PRN Meds:.acetaminophen **OR** acetaminophen, albuterol, HYDROcodone-acetaminophen, morphine injection, ondansetron **OR** ondansetron (ZOFRAN) IV Allergies  Allergen Reactions   Ace Inhibitors Anaphylaxis and Swelling   Lisinopril Anaphylaxis, Swelling and Other (See Comments)    Angioedema   Valsartan Anaphylaxis, Swelling and Other (See Comments)    Angioedema.  Pt should not get ACEI or ARBs of any kind due to angioedema.   Aspirin Other (See Comments)    GI ulcers   Penicillins Other (See Comments)    Childhood allergy Has patient had a PCN  reaction causing immediate rash, facial/tongue/throat swelling, SOB or lightheadedness with hypotension: Unknown Has patient had a PCN reaction causing severe rash involving mucus membranes or skin necrosis: Unknown Has patient had a PCN reaction that required hospitalization: Unknown Has patient had a PCN reaction occurring within the last 10 years: Unknown If all of the above answers are "NO", then may proceed with Cephalosporin use.    CBC:    Component Value Date/Time   WBC 4.6 07/29/2022 0826   HGB 9.1 (L) 07/29/2022 0826   HCT 29.8 (L) 07/29/2022 0826   PLT 177 07/29/2022 0826   MCV 78.8 (L) 07/29/2022 0826   NEUTROABS 10.5 (H) 07/05/2022 1200   LYMPHSABS 1.2 07/05/2022 1200   MONOABS 0.9 07/05/2022 1200   EOSABS 0.2 07/05/2022 1200   BASOSABS 0.0 07/05/2022 1200   Comprehensive Metabolic Panel:    Component Value Date/Time   NA 132 (L) 07/29/2022 0826   NA 137 05/24/2018 1535   K 4.8 07/29/2022 0826   CL 89 (L) 07/29/2022 0826   CO2 32 07/29/2022 0826   BUN 33 (H) 07/29/2022 0826   BUN 5 (L) 05/24/2018 1535   CREATININE 1.07 (H) 07/29/2022 0826   CREATININE 0.96 09/12/2014 1632   GLUCOSE 126 (H) 07/29/2022 0826   CALCIUM 8.4 (L) 07/29/2022 0826   AST 27 07/29/2022 0826   ALT 21 07/29/2022 0826   ALKPHOS 77 07/29/2022 0826   BILITOT 0.6 07/29/2022 0826   BILITOT 0.6 05/24/2018 1535   PROT 7.2 07/29/2022 0826   PROT 7.7 05/24/2018 1535   ALBUMIN 3.1 (L) 07/29/2022 0826   ALBUMIN 3.4 (L) 05/24/2018 1535    Physical Exam: Vital Signs: BP 108/69 (BP Location: Right Arm)   Pulse 88   Temp 98.7 F (37.1 C) (Oral)   Resp 20   Ht '5\' 4"'$  (1.626 m)   Wt 48.1 kg   SpO2 92% Comment: rechecked 87to 92 after oxygen prongs were adjusted  BMI 18.20 kg/m  SpO2: SpO2: 92 % (rechecked 87to 92 after oxygen prongs were adjusted) O2 Device: O2 Device: Nasal Cannula O2 Flow Rate: O2 Flow Rate (L/min): 4 L/min Intake/output summary:  Intake/Output Summary (Last 24 hours) at  07/29/2022 1639 Last data filed at 07/29/2022 1523 Gross per 24 hour  Intake 650 ml  Output 1050 ml  Net -400 ml   LBM: Last BM Date :  07/24/22 Baseline Weight: Weight: 46.2 kg Most recent weight: Weight: 48.1 kg  General: NAD, alert, laying in bed, pleasant Eyes: No discharge noted HENT: moist mucous membranes Cardiovascular: RRR Respiratory: slightly increased work of breathing noted, not in respiratory distress, on O2 nasal cannula support Abdomen: not distended Extremities: Moving all appropriately Skin: no rashes or lesions on visible skin Neuro: A&Ox4, following commands easily Psych: appropriately answers all questions          Palliative Performance Scale: 40%              Additional Data Reviewed: Recent Labs    07/23/2022 1235 07/29/22 0826  WBC 8.3 4.6  HGB 9.7* 9.1*  PLT 189 177  NA 129* 132*  BUN 19 33*  CREATININE 1.02* 1.07*    Imaging: DG Chest Port 1 View CLINICAL DATA:  Cough and dyspnea.  EXAM: PORTABLE CHEST 1 VIEW  COMPARISON:  Chest radiograph from 07/05/2022 and 06/14/2022.  FINDINGS: Stable cardiomediastinal contours. Extensive calcified mediastinal and hilar lymph nodes are again noted. 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 Persistent opacities within the right upper lobe and right lower lobe are noted compared with 07/05/2022. These are new compared with 06/14/2022. Small right pleural effusion is again noted and appears unchanged.  IMPRESSION: 1. Persistent opacities within the right upper lobe and right lower lobe compatible with atelectasis and/or pneumonia. 2. Stable small right pleural effusion. 3. 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 4. Stable calcified mediastinal and hilar lymph nodes.  Electronically Signed   By: Kerby Moors M.D.   On: 07/26/2022 12:33    I personally  reviewed recent imaging.   Palliative Care Assessment and Plan Summary of Established Goals of Care and Medical Treatment Preferences   Patient is a 76 year old female with a past medical history of left eye blindness, COPD with emphysema, pulmonary fibrosis, sarcoidosis, depression, hypertension, GERD, and multiple valvular insufficiencies who was admitted on 07/16/2022 for management of shortness of breath associated with wheezing and difficulty breathing.  Patient has had multiple recurrent hospitalizations and admissions for hypoxia management.  During hospitalization patient has received therapies for COPD management and fluid overload.  Palliative medicine team consulted to discuss complex medical decision making.  # Complex medical decision making/goals of care  -Extensive conversation with patient and daughter as documented in detail above in HPI.  Patient able to discuss she knows she has severe lung disease that is not getting better.  Patient noted the idea of hospice had been introduced though she associated with what hospice used to be and people just dying immediately.  Noted hospice would follow focus on quality of life for however long she does have to support her being at home with symptoms manage which is her goal.  Noted would continue these discussions.  [name] did state that a representative from [facility] discussed potentially meeting with family to discuss hospice with patient.  Provided my information to [name] to provide to this hospice representative so we could all discuss together.  -  Code Status: DNR -Patient's CODE STATUS appropriately updated from full code to DNR based on discussion with patient as described above in HPI. Prognosis: < 6 months.  Patient has severe lung disease with multiple recent hospitalizations, worsening functional status, hypoxia on room air, and severe right-sided heart failure with pulmonary hypertension.  # Symptom management  -Dyspnea/pain, in the  setting of severe end-stage lung disease   -  Discontinued Norco   -Started oxycodone 5 mg every 4 hours as needed.  Counseled patient on use of medication.  Patient likely needs long-acting medication to assist with underlying work of breathing though will determine needs based on short acting medication requirements.  Patient agreeing with this plan.  # Psycho-social/Spiritual Support:  - Support System: daughters, son  # Discharge Planning:  TBD  Thank you for allowing the palliative care team to participate in the care [place].  Chelsea Aus, DO Palliative Care Provider PMT # [phone]  If patient remains symptomatic despite maximum doses, please call PMT at [phone] between 0700 and 1900. Outside of these hours, please call attending, as PMT does not have night coverage.

## 2022-07-29 NOTE — Evaluation (Addendum)
Occupational Therapy Evaluation Patient Details Name: [name] MRN: [id number] DOB: 10-22-1945 Today's Date: 07/29/2022   History of Present Illness Patient is a 76 year old female who presented with O2 sats in 70%-80% with EMS called and placed on NRB. Patient was admitted with chronic hypoxemia due to lung disease, pulmonary fibrosis and COPD with exacerbation, sever tricuspid regurgitation, and PMH:  fall on 11/23 with clavicle fx with displacement, pneumonia, COPD exacerbation, acute metabolic encephalopathy.   Clinical Impression   Patient is a 76 year old female who was admitted for above. Patient was living at home with family support after recent d/c from hospital.  Patient was non compliant with sling use and NWB of LUE at home per patient report and lack of sling present at this time in hospital. Order placed for new sling on this date. Patients O2 dropped to 84% sitting EOB on 4L/min with increased dizziness. BP was 98/68 mmhg with HR of 90 bpm. After three mins of sitting EOB patients BP was 92/68 mmhg with HR 90 bpm. Patient continued to report dizziness and patient was transitioned back to supine. Patient was noted to have decreased functional activity tolerance, decreased endurance, decreased standing balance, decreased safety awareness, increased need for supplemental o2,decreased ability to follow LUE restrictions, and decreased knowledge of AD/AE impacting participation in ADLs. Patient would continue to benefit from skilled OT services at this time while admitted and after d/c to address noted deficits in order to improve overall safety and independence in ADLs.       Recommendations for follow up therapy are one component of a multi-disciplinary discharge planning process, led by the attending physician.  Recommendations may be updated based on patient status, additional functional criteria and insurance authorization.   Follow Up Recommendations  Home health OT      Assistance Recommended at Discharge Frequent or constant Supervision/Assistance  Patient can return home with the following A little help with walking and/or transfers;A lot of help with bathing/dressing/bathroom;Assistance with cooking/housework;Direct supervision/assist for medications management;Assist for transportation;Help with stairs or ramp for entrance;Direct supervision/assist for financial management    Functional Status Assessment  Patient has had a recent decline in their functional status and demonstrates the ability to make significant improvements in function in a reasonable and predictable amount of time.  Equipment Recommendations  None recommended by OT    Recommendations for Other Services       Precautions / Restrictions Precautions Precautions: Fall Precaution Comments: monitor sats and HR, L clavicle fx. Required Braces or Orthoses: Sling Restrictions Weight Bearing Restrictions: Yes LUE Weight Bearing: Non weight bearing Other Position/Activity Restrictions: sling order placed on 12/19      Mobility Bed Mobility Overal bed mobility: Needs Assistance Bed Mobility: Supine to Sit, Sit to Supine     Supine to sit: Min assist Sit to supine: Min assist   General bed mobility comments: with consistent cues to keep LUE on stomach and not use it.    Transfers                          Balance Overall balance assessment: Mild deficits observed, not formally tested                                         ADL either performed or assessed with clinical judgement   ADL Overall ADL's :  Needs assistance/impaired Eating/Feeding: Set up;Bed level   Grooming: Sitting;Minimal assistance   Upper Body Bathing: Sitting;Moderate assistance   Lower Body Bathing: Moderate assistance;Sitting/lateral leans   Upper Body Dressing : Moderate assistance;Sitting   Lower Body Dressing: Sitting/lateral leans;Sit to/from stand;Maximal  assistance     Toilet Transfer Details (indicate cue type and reason): unable to transfer on this date with patient onset of dizziness and SOB sitting EOB. BP noted above. Toileting- Clothing Manipulation and Hygiene: Sit to/from stand;Maximal assistance               Vision Patient Visual Report: No change from baseline       Perception     Praxis      Pertinent Vitals/Pain Pain Assessment Pain Assessment: Faces Faces Pain Scale: Hurts a little bit Pain Location: L shoulder with movements Pain Descriptors / Indicators: Discomfort, Guarding Pain Intervention(s): Limited activity within patient's tolerance, Monitored during session     Hand Dominance Right   Extremity/Trunk Assessment Upper Extremity Assessment Upper Extremity Assessment: LUE deficits/detail LUE Deficits / Details: clavicular fracture with sling and NWB recommendation. patient noted to lift up arm even with multimodal cues to keep down.   Lower Extremity Assessment Lower Extremity Assessment: Defer to PT evaluation   Cervical / Trunk Assessment Cervical / Trunk Assessment: Normal   Communication     Cognition Arousal/Alertness: Awake/alert Behavior During Therapy: WFL for tasks assessed/performed Overall Cognitive Status: No family/caregiver present to determine baseline cognitive functioning                                 General Comments: Patient is plesant and cooperative, noted to have no awareness of recommendations for LUE.     General Comments       Exercises     Shoulder Instructions      Home Living Family/patient expects to be discharged to:: Private residence Living Arrangements: Children Available Help at Discharge: Family;Available 24 hours/day Type of Home: House Home Access: Stairs to enter [employer] of Steps: 2 Entrance Stairs-Rails: Right;Left Home Layout: One level     Bathroom Shower/Tub: Tub/shower unit;Walk-in shower   Bathroom  Toilet: Standard     Home Equipment: Conservation officer, nature (2 wheels);Cane - single point;BSC/3in1;Shower seat   Additional Comments: info from previous encounter      Prior Functioning/Environment Prior Level of Function : Independent/Modified Independent             Mobility Comments: since Left shoulder fracture, has been limited and requires assistance ADLs Comments: independent with ADLs and IADLs (cooks, clothes, etc). info taken from previous hospitalization. patient was noted to have some confusion.        OT Problem List: Decreased strength;Decreased activity tolerance;Impaired UE functional use;Pain;Impaired balance (sitting and/or standing);Decreased coordination;Decreased safety awareness;Decreased knowledge of precautions;Decreased knowledge of use of DME or AE;Cardiopulmonary status limiting activity      OT Treatment/Interventions: Self-care/ADL training;Energy conservation;Therapeutic exercise;DME and/or AE instruction;Neuromuscular education;Therapeutic activities;Patient/family education;Balance training    OT Goals(Current goals can be found in the care plan section) Acute Rehab OT Goals Patient Stated Goal: to get comfortable to be able to do things she needs to do OT Goal Formulation: With patient Time For Goal Achievement: 08/12/22 Potential to Achieve Goals: Fair  OT Frequency: Min 2X/week    Co-evaluation              AM-PAC OT "6 Clicks" Daily Activity     Outcome  Measure Help from another person eating meals?: A Little Help from another person taking care of personal grooming?: A Little Help from another person toileting, which includes using toliet, bedpan, or urinal?: A Lot Help from another person bathing (including washing, rinsing, drying)?: A Lot Help from another person to put on and taking off regular upper body clothing?: A Lot Help from another person to put on and taking off regular lower body clothing?: A Lot 6 Click Score: 14   End of  Session Equipment Utilized During Treatment: Oxygen Nurse Communication: Mobility status;Other (comment) (need for sling)  Activity Tolerance: Other (comment);Patient limited by fatigue (decreased BP and O2) Patient left: in bed;with call bell/phone within reach;with bed alarm set  OT Visit Diagnosis: Unsteadiness on feet (R26.81);Other abnormalities of gait and mobility (R26.89);Pain Pain - Right/Left: Left Pain - part of body: Shoulder                Time: 8527-7824 OT Time Calculation (min): 28 min Charges:  OT General Charges $OT Visit: 1 Visit OT Treatments $Self Care/Home Management : 8-22 mins  Rennie Plowman, MS Acute Rehabilitation Department Office# [phone]   Willa Rough 07/29/2022, 3:01 PM

## 2022-07-29 NOTE — Progress Notes (Signed)
PT Cancellation Note  Patient Details Name: [name] MRN: [id number] DOB: Dec 03, 1945   Cancelled Treatment:     PT order received but eval deferred this date.  Pt orthostatic with OT on eval and unable to progress past EOB sitting.  Will follow in am.   Kiran Carline 07/29/2022, 2:37 PM

## 2022-07-29 NOTE — Progress Notes (Signed)
RT gave pt flutter valve per MD order. Pt knows and understands how to use.

## 2022-07-29 NOTE — Progress Notes (Signed)
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  In the emergency room afebrile.  Respiratory 24.  Blood pressure stable.  Oxygenation adequate on 2 L oxygen.  Due to significant shortness of breath and wheezing she was admitted to the hospital.  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   Assessment & Plan:   Chronic hypoxemia due to lung disease: Pulmonary fibrosis and COPD with exacerbation: Agree with admission to monitored unit because of severity of symptoms.  Still significantly short of breath. Aggressive bronchodilator therapy, oral steroids, inhalational steroids, scheduled and as needed bronchodilators, deep breathing exercises, incentive spirometry, chest physiotherapy.  PT and OT. Azithromycin 500 mg daily for 5 days. Supplemental oxygen to keep saturations more than 90%. Consult palliative care team, may benefit with home-based hospice services for COPD.  Severe tricuspid regurgitation, right-sided congestive heart failure with severe pulmonary hypertension.  Sarcoid destroyed lungs: BNP 439, recent echocardiogram with grade 1 diastolic dysfunction, severe tricuspid valve regurgitation.  End-stage COPD with severe pulmonary hypertension. Continue Lasix for symptom relief.  Previously seen by cardiology.  Recommended palliation.  Chronic medical issues including GERD, on PPI Hyperlipidemia, on statins Paroxysmal SVT, currently sinus rhythm.  On metoprolol.     DVT  prophylaxis: SCDs Start: 07/12/2022 1513   Code Status: Full code Family Communication: Daughter on the phone Disposition Plan: Status is: Observation The patient will require care spanning > 2 midnights and should be moved to inpatient because: Significant shortness of breath wheezing.  IV diuresis.     Consultants:  Palliative  Procedures:  None  Antimicrobials:  Azithromycin 12/19---   Subjective: Patient seen and examined.  She tells me she is okay at rest but scared to ambulate.  Denies any nausea or vomiting.  Dry cough.  Afebrile.  On minimal oxygen.  Daughter on the phone, called and updated.  We discussed about severe nature of her irreversible lung condition and severe right-sided heart failure along with pulmonary hypertension.  We discussed about recurrent hospitalization related to these irreversible conditions.  Patient and family aware about poor lung functions and they are worried about patient requiring to need to come to the hospital.  Agreeable to have palliative care consultation to discuss about more treatment plans and symptom control.  Objective: Vitals:   07/29/22 0124 07/29/22 0537 07/29/22 0740 07/29/22 0813  BP: 112/61 (!) 116/59    Pulse: 91 93    Resp: 17 17    Temp: 98.8 F (37.1 C) 97.7 F (36.5 C)    TempSrc: Oral Oral    SpO2: 99% 100% 99%   Weight:    48.1 kg  Height:        Intake/Output Summary (Last 24 hours) at 07/29/2022 1122 Last data filed at 07/29/2022 0917 Gross per 24 hour  Intake 50 ml  Output 1050 ml  Net -1000 ml   Filed Weights   07/26/2022 1135 07/29/22 0813  Weight: 46.2 kg 48.1 kg    Examination:  General exam: Appears calm and comfortable at rest.  Alert oriented.  Interactive. Respiratory system: Extensive bilateral expiratory inspiratory  wheezes and coarse crackles.  Not in any distress.  Not using any accessory muscles. Cardiovascular system: S1 & S2 heard, RRR.  Loud pansystolic murmur right parasternal.  No  pedal edema. Gastrointestinal system: Abdomen is nondistended, soft and nontender. No organomegaly or masses felt. Normal bowel sounds heard. Central nervous system: Alert and oriented. No focal neurological deficits.  Grossly weak.   Data Reviewed: I have personally reviewed following labs and imaging studies  CBC: Recent Labs  Lab 07/17/2022 1235 07/29/22 0826  WBC 8.3 4.6  HGB 9.7* 9.1*  HCT 32.5* 29.8*  MCV 80.0 78.8*  PLT 189 542   Basic Metabolic Panel: Recent Labs  Lab 08/03/2022 1235 07/29/22 0826  NA 129* 132*  K 4.9 4.8  CL 89* 89*  CO2 34* 32  GLUCOSE 96 126*  BUN 19 33*  CREATININE 1.02* 1.07*  CALCIUM 8.9 8.4*   GFR: Estimated Creatinine Clearance: 34 mL/min (A) (by C-G formula based on SCr of 1.07 mg/dL (H)). Liver Function Tests: Recent Labs  Lab 08/05/2022 1235 07/29/22 0826  AST 19 27  ALT 14 21  ALKPHOS 72 77  BILITOT 0.6 0.6  PROT 7.7 7.2  ALBUMIN 3.0* 3.1*   No results for input(s): "LIPASE", "AMYLASE" in the last 168 hours. No results for input(s): "AMMONIA" in the last 168 hours. Coagulation Profile: No results for input(s): "INR", "PROTIME" in the last 168 hours. Cardiac Enzymes: No results for input(s): "CKTOTAL", "CKMB", "CKMBINDEX", "TROPONINI" in the last 168 hours. BNP (last 3 results) No results for input(s): "PROBNP" in the last 8760 hours. HbA1C: No results for input(s): "HGBA1C" in the last 72 hours. CBG: No results for input(s): "GLUCAP" in the last 168 hours. Lipid Profile: No results for input(s): "CHOL", "HDL", "LDLCALC", "TRIG", "CHOLHDL", "LDLDIRECT" in the last 72 hours. Thyroid Function Tests: No results for input(s): "TSH", "T4TOTAL", "FREET4", "T3FREE", "THYROIDAB" in the last 72 hours. Anemia Panel: No results for input(s): "VITAMINB12", "FOLATE", "FERRITIN", "TIBC", "IRON", "RETICCTPCT" in the last 72 hours. Sepsis Labs: No results for input(s): "PROCALCITON", "LATICACIDVEN" in the last 168 hours.  Recent Results  (from the past 240 hour(s))  Resp panel by RT-PCR (RSV, Flu A&B, Covid) Anterior Nasal Swab     Status: None   Collection Time: 07/19/2022 12:06 PM   Specimen: Anterior Nasal Swab  Result Value Ref Range Status   SARS Coronavirus 2 by RT PCR NEGATIVE NEGATIVE Final    Comment: (NOTE) SARS-CoV-2 target nucleic acids are NOT DETECTED.  The SARS-CoV-2 RNA is generally detectable in upper respiratory specimens during the acute phase of infection. The lowest concentration of SARS-CoV-2 viral copies this assay can detect is 138 copies/mL. A negative result does not preclude SARS-Cov-2 infection and should not be used as the sole basis for treatment or other patient management decisions. A negative result may occur with  improper specimen collection/handling, submission of specimen other than nasopharyngeal swab, presence of viral mutation(s) within the areas targeted by this assay, and inadequate number of viral copies(<138 copies/mL). A negative result must be combined with clinical observations, patient history, and epidemiological information. The expected result is Negative.  Fact Sheet for Patients:  EntrepreneurPulse.com.au  Fact Sheet for Healthcare Providers:  IncredibleEmployment.be  This test is no t yet approved or cleared by the [place] FDA and  has been authorized for detection and/or diagnosis of SARS-CoV-2 by FDA under an Emergency Use Authorization (EUA). This EUA will remain  in effect (meaning this test can be used) for the duration of  the COVID-19 declaration under Section 564(b)(1) of the Act, 21 U.S.C.section 360bbb-3(b)(1), unless the authorization is terminated  or revoked sooner.       Influenza A by PCR NEGATIVE NEGATIVE Final   Influenza B by PCR NEGATIVE NEGATIVE Final    Comment: (NOTE) The Xpert Xpress SARS-CoV-2/FLU/RSV plus assay is intended as an aid in the diagnosis of influenza from Nasopharyngeal swab  specimens and should not be used as a sole basis for treatment. Nasal washings and aspirates are unacceptable for Xpert Xpress SARS-CoV-2/FLU/RSV testing.  Fact Sheet for Patients: EntrepreneurPulse.com.au  Fact Sheet for Healthcare Providers: IncredibleEmployment.be  This test is not yet approved or cleared by the [place] FDA and has been authorized for detection and/or diagnosis of SARS-CoV-2 by FDA under an Emergency Use Authorization (EUA). This EUA will remain in effect (meaning this test can be used) for the duration of the COVID-19 declaration under Section 564(b)(1) of the Act, 21 U.S.C. section 360bbb-3(b)(1), unless the authorization is terminated or revoked.     Resp Syncytial Virus by PCR NEGATIVE NEGATIVE Final    Comment: (NOTE) Fact Sheet for Patients: EntrepreneurPulse.com.au  Fact Sheet for Healthcare Providers: IncredibleEmployment.be  This test is not yet approved or cleared by the [place] FDA and has been authorized for detection and/or diagnosis of SARS-CoV-2 by FDA under an Emergency Use Authorization (EUA). This EUA will remain in effect (meaning this test can be used) for the duration of the COVID-19 declaration under Section 564(b)(1) of the Act, 21 U.S.C. section 360bbb-3(b)(1), unless the authorization is terminated or revoked.  Performed at [facility], [place] [address], [place], [place] [address]          Radiology Studies: [facility] Chest Port 1 View  Result Date: 07/24/2022 CLINICAL DATA:  Cough and dyspnea. EXAM: PORTABLE CHEST 1 VIEW COMPARISON:  Chest radiograph from 07/05/2022 and 06/14/2022. FINDINGS: Stable cardiomediastinal contours. Extensive calcified mediastinal and hilar lymph nodes are again noted. Marked bilateral confluent scarring and fibrosis is again seen within both lungs reflecting chronic interstitial lung disease consistent  with sarcoid. Persistent opacities within the right upper lobe and right lower lobe are noted compared with 07/05/2022. These are new compared with 06/14/2022. Small right pleural effusion is again noted and appears unchanged. IMPRESSION: 1. Persistent opacities within the right upper lobe and right lower lobe compatible with atelectasis and/or pneumonia. 2. Stable small right pleural effusion. 3. 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 4. Stable calcified mediastinal and hilar lymph nodes. Electronically Signed   By: Kerby Moors M.D.   On: 07/27/2022 12:33        Scheduled Meds:  azithromycin  500 mg Oral Daily   famotidine  20 mg Oral QHS   furosemide  20 mg Intravenous BID   gabapentin  100 mg Oral BID   ipratropium-albuterol  3 mL Nebulization BID   melatonin  5 mg Oral QHS   metoprolol succinate  50 mg Oral Daily   mirtazapine  15 mg Oral QHS   pantoprazole  40 mg Oral Daily   pravastatin  40 mg Oral QPM   predniSONE  40 mg Oral Q breakfast   spironolactone  25 mg Oral Daily   Continuous Infusions:   LOS: 0 days    Time spent: 35 minutes    Barb Merino, MD [facility] Pager [phone]

## 2022-07-30 ENCOUNTER — Inpatient Hospital Stay (HOSPITAL_COMMUNITY): Payer: Medicare Other

## 2022-07-30 DIAGNOSIS — Z7189 Other specified counseling: Secondary | ICD-10-CM

## 2022-07-30 DIAGNOSIS — Z515 Encounter for palliative care: Secondary | ICD-10-CM | POA: Diagnosis not present

## 2022-07-30 DIAGNOSIS — I5033 Acute on chronic diastolic (congestive) heart failure: Secondary | ICD-10-CM | POA: Diagnosis not present

## 2022-07-30 DIAGNOSIS — R4589 Other symptoms and signs involving emotional state: Secondary | ICD-10-CM

## 2022-07-30 DIAGNOSIS — R451 Restlessness and agitation: Secondary | ICD-10-CM

## 2022-07-30 DIAGNOSIS — J9622 Acute and chronic respiratory failure with hypercapnia: Secondary | ICD-10-CM | POA: Diagnosis not present

## 2022-07-30 DIAGNOSIS — J9621 Acute and chronic respiratory failure with hypoxia: Secondary | ICD-10-CM | POA: Diagnosis not present

## 2022-07-30 DIAGNOSIS — J441 Chronic obstructive pulmonary disease with (acute) exacerbation: Secondary | ICD-10-CM | POA: Diagnosis not present

## 2022-07-30 LAB — BLOOD GAS, ARTERIAL
Acid-Base Excess: 2.2 mmol/L — ABNORMAL HIGH (ref 0.0–2.0)
Acid-Base Excess: 8.6 mmol/L — ABNORMAL HIGH (ref 0.0–2.0)
Bicarbonate: 35.1 mmol/L — ABNORMAL HIGH (ref 20.0–28.0)
Bicarbonate: 39.5 mmol/L — ABNORMAL HIGH (ref 20.0–28.0)
O2 Saturation: 100 %
O2 Saturation: 99.2 %
Patient temperature: 36.3
Patient temperature: 37.1
pCO2 arterial: 105 mmHg (ref 32–48)
pCO2 arterial: 90 mmHg (ref 32–48)
pH, Arterial: 7.13 — CL (ref 7.35–7.45)
pH, Arterial: 7.25 — ABNORMAL LOW (ref 7.35–7.45)
pO2, Arterial: 333 mmHg — ABNORMAL HIGH (ref 83–108)
pO2, Arterial: 90 mmHg (ref 83–108)

## 2022-07-30 LAB — BASIC METABOLIC PANEL
Anion gap: 13 (ref 5–15)
BUN: 44 mg/dL — ABNORMAL HIGH (ref 8–23)
CO2: 29 mmol/L (ref 22–32)
Calcium: 8.1 mg/dL — ABNORMAL LOW (ref 8.9–10.3)
Chloride: 90 mmol/L — ABNORMAL LOW (ref 98–111)
Creatinine, Ser: 2.51 mg/dL — ABNORMAL HIGH (ref 0.44–1.00)
GFR, Estimated: 19 mL/min — ABNORMAL LOW (ref >60)
Glucose, Bld: 117 mg/dL — ABNORMAL HIGH (ref 70–99)
Potassium: 4.9 mmol/L (ref 3.5–5.1)
Sodium: 132 mmol/L — ABNORMAL LOW (ref 135–145)

## 2022-07-30 LAB — GLUCOSE, CAPILLARY: Glucose-Capillary: 279 mg/dL — ABNORMAL HIGH (ref 70–99)

## 2022-07-30 MED ORDER — NALOXONE HCL 0.4 MG/ML IJ SOLN: 0.2000 mg | INTRAMUSCULAR | Status: DC | PRN

## 2022-07-30 MED ORDER — LORAZEPAM 2 MG/ML IJ SOLN
INTRAMUSCULAR | Status: AC
  Filled 2022-07-30: qty 1

## 2022-07-30 MED ORDER — CHLORHEXIDINE GLUCONATE CLOTH 2 % EX PADS
6.0000 | MEDICATED_PAD | Freq: Every day | CUTANEOUS | Status: DC
  Administered 2022-07-30: 6 via TOPICAL

## 2022-07-30 MED ORDER — NALOXONE HCL 0.4 MG/ML IJ SOLN
0.2000 mg | Freq: Once | INTRAMUSCULAR | Status: AC
  Filled 2022-07-30: qty 1

## 2022-07-30 MED ORDER — HYDROMORPHONE HCL 1 MG/ML IJ SOLN
1.0000 mg | INTRAMUSCULAR | Status: DC | PRN
  Administered 2022-07-30: 1 mg via INTRAVENOUS

## 2022-07-30 MED ORDER — POLYVINYL ALCOHOL 1.4 % OP SOLN: 1.0000 [drp] | Freq: Four times a day (QID) | OPHTHALMIC | Status: DC | PRN

## 2022-07-30 MED ORDER — HYDROMORPHONE HCL 1 MG/ML IJ SOLN: 0.5000 mg | INTRAMUSCULAR | Status: DC | PRN

## 2022-07-30 MED ORDER — NALOXONE HCL 0.4 MG/ML IJ SOLN
INTRAMUSCULAR | Status: AC
  Administered 2022-07-30: 0.4 mg via INTRAVENOUS
  Filled 2022-07-30: qty 1

## 2022-07-30 MED ORDER — HYDROMORPHONE HCL 1 MG/ML IJ SOLN
INTRAMUSCULAR | Status: AC
  Filled 2022-07-30: qty 1

## 2022-07-30 MED ORDER — GLYCOPYRROLATE 0.2 MG/ML IJ SOLN: 0.2000 mg | INTRAMUSCULAR | Status: DC | PRN

## 2022-07-30 MED ORDER — NALOXONE HCL 0.4 MG/ML IJ SOLN
INTRAMUSCULAR | Status: AC
  Administered 2022-07-30: 0.2 mg via INTRAVENOUS
  Filled 2022-07-30: qty 1

## 2022-07-30 MED ORDER — NALOXONE HCL 0.4 MG/ML IJ SOLN: 0.4000 mg | Freq: Once | INTRAMUSCULAR | Status: AC

## 2022-07-30 MED ORDER — FUROSEMIDE 10 MG/ML IJ SOLN
INTRAMUSCULAR | Status: AC
  Filled 2022-07-30: qty 4

## 2022-07-30 MED ORDER — BIOTENE DRY MOUTH MT LIQD: 15.0000 mL | OROMUCOSAL | Status: DC | PRN

## 2022-07-30 MED ORDER — HALOPERIDOL LACTATE 5 MG/ML IJ SOLN
0.5000 mg | INTRAMUSCULAR | Status: DC | PRN
  Filled 2022-07-30: qty 1

## 2022-07-30 MED ORDER — NALOXONE HCL 0.4 MG/ML IJ SOLN
0.2000 mg | Freq: Once | INTRAMUSCULAR | Status: AC
  Administered 2022-07-30: 0.2 mg via INTRAVENOUS

## 2022-07-30 MED ORDER — SODIUM CHLORIDE 0.9 % IV SOLN
0.5000 mg/h | INTRAVENOUS | Status: DC
  Administered 2022-07-30: 0.5 mg/h via INTRAVENOUS
  Filled 2022-07-30: qty 5

## 2022-07-30 MED ORDER — LORAZEPAM 2 MG/ML IJ SOLN
1.0000 mg | INTRAMUSCULAR | Status: DC | PRN
  Administered 2022-07-30: 1 mg via INTRAVENOUS

## 2022-07-30 MED ORDER — ONDANSETRON HCL 4 MG/2ML IJ SOLN: 4.0000 mg | Freq: Four times a day (QID) | INTRAMUSCULAR | Status: DC | PRN

## 2022-08-08 DIAGNOSIS — J8410 Pulmonary fibrosis, unspecified: Secondary | ICD-10-CM | POA: Diagnosis not present

## 2022-08-08 DIAGNOSIS — A419 Sepsis, unspecified organism: Secondary | ICD-10-CM | POA: Diagnosis not present

## 2022-08-08 DIAGNOSIS — J690 Pneumonitis due to inhalation of food and vomit: Secondary | ICD-10-CM | POA: Diagnosis not present

## 2022-08-08 DIAGNOSIS — J439 Emphysema, unspecified: Secondary | ICD-10-CM | POA: Diagnosis not present

## 2022-08-08 DIAGNOSIS — S42009A Fracture of unspecified part of unspecified clavicle, initial encounter for closed fracture: Secondary | ICD-10-CM | POA: Diagnosis not present

## 2022-08-11 NOTE — Progress Notes (Signed)
Rapid Response Event Note   Reason for Call : pt unresponsive   Initial Focused Assessment: pt being bagged by primary RN, with NP provider at bedside.  Pt agonal breathing.  See Vitals per flowsheet.  Pt indeed doesn't respond to  sternal rub.  Pupils intact.  Temperature 97.5 axillary.    Interventions: Multiple disciplinary team at the bedside, as orders are implemented.  See chart. Ultimately, pt responded after multiple does of narcan. Pt aroused and pulling at equipment.    Plan of Care: Pt moved to SD to be placed on BIPAP.   Event Summary:   MD Notified: Yes Call Time: 0450 Arrival Time: 0981 End Time: 0600  Dyann Ruddle, RN

## 2022-08-11 NOTE — Progress Notes (Signed)
Chaplain engaged in an initial visit with Koya's family.  They described her as being the life of the party, full of laughter, strong, loving, confident, and as someone who would speak her mind.  Chaplain offered a prayer over [place] with her family.     2022/08/07 1300  Clinical Encounter Type  Visited With Patient and family together  Visit Type Spiritual support  Referral From Nurse;Palliative care team  Consult/Referral To Chaplain  Spiritual Encounters  Spiritual Needs Prayer

## 2022-08-11 NOTE — Progress Notes (Signed)
RT NOTE:  ABG obtained and sent to lab. Lab notified. 

## 2022-08-11 NOTE — Death Summary Note (Signed)
DEATH SUMMARY   Patient Details  Name: [name] MRN: [id number] DOB: Dec 12, 1945 IFO:YDXAJOINO Everardo Beals, MD Admission/Discharge Information   Admit Date:  August 14, 2022  Date of Death: Date of Death: August 16, 2022  Time of Death: Time of Death: November 07, 1717  Length of Stay: 1   Principle Cause of death: Respiratory arrest, acute hypercapnic respiratory failure.  Hospital Diagnoses: Principal Problem:   Acute on chronic respiratory failure with hypoxia and hypercapnia (HCC) Active Problems:   COPD with acute exacerbation (HCC)   GERD   Dyslipidemia   Hyponatremia   Pulmonary fibrosis (HCC)   Paroxysmal SVT (supraventricular tachycardia)   Acute on chronic diastolic congestive heart failure (HCC)   DNR (do not resuscitate)   High risk medication use   Goals of care, counseling/discussion   Palliative care encounter   Need for emotional support   Agitation   Coordination of complex care   End of life care   Hospital Course: 77 year old with history of left eye blindness, 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  In the emergency room afebrile.  Respiratory rate 24.  Blood pressures stable.  Oxygenation adequate on 2 L oxygen.  Due to significant shortness of breath and wheezing she was admitted to the hospital.  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  12/19, goal of care discussion, seen by palliative care.  Continued care.  Changed to DNR. 2023-08-17, early morning hours patient was unresponsive with severe hypercapnia.    Transferred to stepdown unit on BiPAP, patient remained persistently encephalopathic even on BiPAP.   Ultimate decision was made to treat her with end-of-life care with comfort care.  Patient died in the hospital under comfort care measures with family at the bedside on August 16, 2022 at 1719.   Patient suffered from severe extensive chronic interstitial lung disease with sarcoidosis and severe pulmonary hypertension.          Procedures: None  Consultations: Palliative care  The results of significant diagnostics from this hospitalization (including imaging, microbiology, ancillary and laboratory) are listed below for reference.   Significant Diagnostic Studies: DG CHEST PORT 1 VIEW  Result Date: Aug 16, 2022 CLINICAL DATA:  Dyspnea EXAM: PORTABLE CHEST 1 VIEW COMPARISON:  Aug 14, 2022 FINDINGS: Diffuse interstitial and alveolar opacity identified consistent with fibrosis and possible superimposed pneumonia. Pleural-based density left upper hemithorax. No pneumothorax identified. Extensive calcified adenopathy identified presumably related to prior granulomatous disease. IMPRESSION: Extensive diffuse interstitial and alveolar opacities consistent with fibrosis and superimposed infection or pulmonary edema. Electronically Signed   By: Sammie Bench M.D.   On: Aug 16, 2022 09:43   DG Chest Port 1 View  Result Date: 08-14-2022 CLINICAL DATA:  Cough and dyspnea. EXAM: PORTABLE CHEST 1 VIEW COMPARISON:  Chest radiograph from 07/05/2022 and 06/14/2022. FINDINGS: Stable cardiomediastinal contours. Extensive calcified mediastinal and hilar lymph nodes are again noted. 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 Persistent opacities within the right upper lobe and right lower lobe are noted compared with 07/05/2022. These are new compared with 06/14/2022. Small right pleural effusion is again noted and appears unchanged. IMPRESSION: 1. Persistent opacities within the right upper lobe and right lower  lobe compatible with atelectasis and/or pneumonia. 2.  Stable small right pleural effusion. 3. 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 4. Stable calcified mediastinal and hilar lymph nodes. Electronically Signed   By: Kerby Moors M.D.   On: 08/01/2022 12:33   ECHOCARDIOGRAM COMPLETE  Result Date: 07/12/2022    ECHOCARDIOGRAM REPORT   Patient Name:   [name] Messinger Date of Exam: 07/12/2022 Medical Rec #:  [id number]         Height:       64.0 in Accession #:    [id number]        Weight:       101.9 lb Date of Birth:  July 09, 1946         BSA:          1.469 m Patient Age:    77 years          BP:           141/89 mmHg Patient Gender: F                 HR:           101 bpm. Exam Location:  Inpatient Procedure: 2D Echo, Cardiac Doppler and Color Doppler Indications:    Congestive Heart Failure I50.9  History:        Patient has prior history of Echocardiogram examinations, most                 recent 11/12/2020. COPD and Sarcoidosis; Risk Factors:Hypertension                 and GERD.  Sonographer:    Bernadene Person RDCS Referring Phys: [id number] RIPUDEEP K RAI IMPRESSIONS  1. Left ventricular ejection fraction, by estimation, is 70 to 75%. The left ventricle has hyperdynamic function. The left ventricle has no regional wall motion abnormalities. Left ventricular diastolic parameters are consistent with Grade I diastolic dysfunction (impaired relaxation).  2. Right ventricular systolic function is normal. The right ventricular size is normal. There is severely elevated pulmonary artery systolic pressure. The estimated right ventricular systolic pressure is 83.3 mmHg.  3. The mitral valve is normal in structure. No evidence of mitral valve regurgitation. No evidence of mitral stenosis.  4. Tricuspid valve regurgitation is severe.  5. The aortic valve is tricuspid. There is mild calcification of the aortic valve. Aortic valve regurgitation is not visualized. Aortic valve sclerosis is present,  with no evidence of aortic valve stenosis.  6. The inferior vena cava is normal in size with greater than 50% respiratory variability, suggesting right atrial pressure of 3 mmHg. Comparison(s): No significant change from prior study. Prior images reviewed side by side. FINDINGS  Left Ventricle: Left ventricular ejection fraction, by estimation, is 70 to 75%. The left ventricle has hyperdynamic function. The left ventricle has no regional wall motion abnormalities. The left ventricular internal cavity size was normal in size. There is no left ventricular hypertrophy. Left ventricular diastolic parameters are consistent with Grade I diastolic dysfunction (impaired relaxation). Right Ventricle: The right ventricular size is normal. No increase in right ventricular wall thickness. Right ventricular systolic function is normal. There is severely elevated pulmonary artery systolic pressure. The tricuspid regurgitant velocity is 4.17 m/s, and with an assumed right atrial pressure of 3 mmHg, the estimated right ventricular systolic pressure is 82.5 mmHg. Left Atrium: Left atrial size was normal in size. Right Atrium: Right atrial size was normal in size. Pericardium: There is no evidence of pericardial effusion. Mitral Valve: The mitral valve is normal in  structure. No evidence of mitral valve regurgitation. No evidence of mitral valve stenosis. Tricuspid Valve: The tricuspid valve is normal in structure. Tricuspid valve regurgitation is severe. No evidence of tricuspid stenosis. Aortic Valve: The aortic valve is tricuspid. There is mild calcification of the aortic valve. Aortic valve regurgitation is not visualized. Aortic valve sclerosis is present, with no evidence of aortic valve stenosis. Pulmonic Valve: The pulmonic valve was normal in structure. Pulmonic valve regurgitation is not visualized. No evidence of pulmonic stenosis. Aorta: The aortic root is normal in size and structure. Venous: The inferior vena cava is  normal in size with greater than 50% respiratory variability, suggesting right atrial pressure of 3 mmHg. IAS/Shunts: No atrial level shunt detected by color flow Doppler.  LEFT VENTRICLE PLAX 2D LVIDd:         2.90 cm     Diastology LVIDs:         2.10 cm     LV e' medial:    5.19 cm/s LV PW:         0.70 cm     LV E/e' medial:  18.1 LV IVS:        0.70 cm     LV e' lateral:   5.35 cm/s LVOT diam:     1.90 cm     LV E/e' lateral: 17.5 LV SV:         53 LV SV Index:   36 LVOT Area:     2.84 cm  LV Volumes (MOD) LV vol d, MOD A2C: 70.5 ml LV vol d, MOD A4C: 52.6 ml LV vol s, MOD A2C: 23.9 ml LV vol s, MOD A4C: 16.9 ml LV SV MOD A2C:     46.6 ml LV SV MOD A4C:     52.6 ml LV SV MOD BP:      41.1 ml RIGHT VENTRICLE RV S prime:     9.24 cm/s TAPSE (M-mode): 1.4 cm LEFT ATRIUM           Index        RIGHT ATRIUM          Index LA diam:      2.20 cm 1.50 cm/m   RA Area:     8.98 cm LA Vol (A2C): 26.2 ml 17.84 ml/m  RA Volume:   20.50 ml 13.96 ml/m LA Vol (A4C): 21.9 ml 14.91 ml/m  AORTIC VALVE LVOT Vmax:   94.60 cm/s LVOT Vmean:  62.900 cm/s LVOT VTI:    0.186 m  AORTA Ao Root diam: 2.90 cm Ao Asc diam:  3.10 cm MITRAL VALVE                TRICUSPID VALVE MV Area (PHT): 6.37 cm     TR Peak grad:   69.6 mmHg MV Decel Time: 119 msec     TR Mean grad:   45.0 mmHg MV E velocity: 93.80 cm/s   TR Vmax:        417.00 cm/s MV A velocity: 166.00 cm/s  TR Vmean:       324.0 cm/s MV E/A ratio:  0.57                             SHUNTS                             Systemic VTI:  0.19 m  Systemic Diam: 1.90 cm Candee Furbish MD Electronically signed by Candee Furbish MD Signature Date/Time: 07/12/2022/5:07:02 PM    Final    CT Head Wo Contrast  Result Date: 07/05/2022 CLINICAL DATA:  Delirium.  Altered mental status. EXAM: CT HEAD WITHOUT CONTRAST TECHNIQUE: Contiguous axial images were obtained from the base of the skull through the vertex without intravenous contrast. RADIATION DOSE REDUCTION: This exam  was performed according to the departmental dose-optimization program which includes automated exposure control, adjustment of the mA and/or kV according to patient size and/or use of iterative reconstruction technique. COMPARISON:  07/03/2022 FINDINGS: Brain: There is no evidence for acute hemorrhage, hydrocephalus, mass lesion, or abnormal extra-axial fluid collection. No definite CT evidence for acute infarction. Patchy low attenuation in the deep hemispheric and periventricular white matter is nonspecific, but likely reflects chronic microvascular ischemic demyelination. Vascular: No hyperdense vessel or unexpected calcification. Skull: No evidence for fracture. No worrisome lytic or sclerotic lesion. Sinuses/Orbits: The visualized paranasal sinuses and mastoid air cells are clear. Visualized portions of the globes and intraorbital fat are unremarkable. Other: None. IMPRESSION: 1. Stable.  No acute intracranial abnormality. 2. Advanced chronic small vessel ischemic disease. Electronically Signed   By: Misty Stanley M.D.   On: 07/05/2022 12:59   DG Chest Port 1 View  Result Date: 07/05/2022 CLINICAL DATA:  Shortness of breath. EXAM: PORTABLE CHEST 1 VIEW COMPARISON:  June 14, 2022. FINDINGS: Stable cardiomediastinal silhouette. Continued presence of a large number of calcified mediastinal and hilar lymph nodes consistent with sarcoidosis. Continued presence of bilateral lung opacities are noted consistent with sarcoidosis. However, there is interval development of lobular density seen in the right midlung which may represent focal inflammation or pneumonia. Also noted is mildly angulated distal left clavicular fracture. IMPRESSION: Chronic findings are again noted consistent with history of sarcoidosis. There is interval development of lobular density seen in right midlung which may represent focal inflammation or pneumonia. Radiographic follow-up is recommended to ensure resolution or stability. Also  noted is mildly angulated distal left clavicular fracture. Electronically Signed   By: Marijo Conception M.D.   On: 07/05/2022 11:32   CT HEAD WO CONTRAST (5MM)  Result Date: 07/03/2022 CLINICAL DATA:  Head and neck pain post fall. EXAM: CT HEAD WITHOUT CONTRAST CT CERVICAL SPINE WITHOUT CONTRAST TECHNIQUE: Multidetector CT imaging of the head and cervical spine was performed following the standard protocol without intravenous contrast. Multiplanar CT image reconstructions of the cervical spine were also generated. RADIATION DOSE REDUCTION: This exam was performed according to the departmental dose-optimization program which includes automated exposure control, adjustment of the mA and/or kV according to patient size and/or use of iterative reconstruction technique. COMPARISON:  CT examination dated June 14, 2022 FINDINGS: CT HEAD FINDINGS Brain: No evidence of acute infarction, hemorrhage, hydrocephalus, extra-axial collection or mass lesion/mass effect. Advanced chronic microvascular ischemic changes of the white matter, unchanged. Vascular: No hyperdense vessel or unexpected calcification. Skull: Normal. Negative for fracture or focal lesion. Sinuses/Orbits: No acute finding.  Bilateral cataract surgery. Other: None CT CERVICAL SPINE FINDINGS Alignment: Mild anterolisthesis of L2. Mild stepwise retrolisthesis of L3, L4 and L5. Skull base and vertebrae: No acute fracture. No primary bone lesion or focal pathologic process. Soft tissues and spinal canal: No prevertebral fluid or swelling. No visible canal hematoma. Disc levels: C2-C3: Bilateral facet joint arthropathy. No significant spinal canal or neural foraminal stenosis. C3-C4: Disc height loss and disc osteophyte complex with spinal canal stenosis. Mild-to-moderate bilateral neural foraminal stenosis. Mild facet  joint arthropathy. C4-C5: Disc height loss and disc osteophyte complex with uncovertebral joint arthropathy. Mild right and moderate left  neural foraminal stenosis. Mild facet joint arthropathy. C5-C6: Disc height loss and disc osteophyte complex with mild spinal canal stenosis. Mild-to-moderate bilateral neural foraminal stenosis with facet joint arthropathy. C6-C7: Disc osteophyte complex with mild left neural foraminal stenosis. C7-T1: Mild to moderate left facet joint arthropathy. No significant spinal canal or neural foraminal stenosis. Upper chest: Prominent biapical pleural/parenchymal scarring. Other: None IMPRESSION: CT HEAD: 1. No acute intracranial abnormality. 2. Advanced chronic microvascular ischemic changes of the white matter, unchanged. CT CERVICAL SPINE: 1. No acute fracture or traumatic subluxation. 2. Multilevel degenerative disc and joint disease, as described above. 3. Prominent biapical pleural/parenchymal scarring. Electronically Signed   By: Keane Police D.O.   On: 07/03/2022 09:23   CT Cervical Spine Wo Contrast  Result Date: 07/03/2022 CLINICAL DATA:  Head and neck pain post fall. EXAM: CT HEAD WITHOUT CONTRAST CT CERVICAL SPINE WITHOUT CONTRAST TECHNIQUE: Multidetector CT imaging of the head and cervical spine was performed following the standard protocol without intravenous contrast. Multiplanar CT image reconstructions of the cervical spine were also generated. RADIATION DOSE REDUCTION: This exam was performed according to the departmental dose-optimization program which includes automated exposure control, adjustment of the mA and/or kV according to patient size and/or use of iterative reconstruction technique. COMPARISON:  CT examination dated June 14, 2022 FINDINGS: CT HEAD FINDINGS Brain: No evidence of acute infarction, hemorrhage, hydrocephalus, extra-axial collection or mass lesion/mass effect. Advanced chronic microvascular ischemic changes of the white matter, unchanged. Vascular: No hyperdense vessel or unexpected calcification. Skull: Normal. Negative for fracture or focal lesion. Sinuses/Orbits: No  acute finding.  Bilateral cataract surgery. Other: None CT CERVICAL SPINE FINDINGS Alignment: Mild anterolisthesis of L2. Mild stepwise retrolisthesis of L3, L4 and L5. Skull base and vertebrae: No acute fracture. No primary bone lesion or focal pathologic process. Soft tissues and spinal canal: No prevertebral fluid or swelling. No visible canal hematoma. Disc levels: C2-C3: Bilateral facet joint arthropathy. No significant spinal canal or neural foraminal stenosis. C3-C4: Disc height loss and disc osteophyte complex with spinal canal stenosis. Mild-to-moderate bilateral neural foraminal stenosis. Mild facet joint arthropathy. C4-C5: Disc height loss and disc osteophyte complex with uncovertebral joint arthropathy. Mild right and moderate left neural foraminal stenosis. Mild facet joint arthropathy. C5-C6: Disc height loss and disc osteophyte complex with mild spinal canal stenosis. Mild-to-moderate bilateral neural foraminal stenosis with facet joint arthropathy. C6-C7: Disc osteophyte complex with mild left neural foraminal stenosis. C7-T1: Mild to moderate left facet joint arthropathy. No significant spinal canal or neural foraminal stenosis. Upper chest: Prominent biapical pleural/parenchymal scarring. Other: None IMPRESSION: CT HEAD: 1. No acute intracranial abnormality. 2. Advanced chronic microvascular ischemic changes of the white matter, unchanged. CT CERVICAL SPINE: 1. No acute fracture or traumatic subluxation. 2. Multilevel degenerative disc and joint disease, as described above. 3. Prominent biapical pleural/parenchymal scarring. Electronically Signed   By: Keane Police D.O.   On: 07/03/2022 09:23   DG Shoulder Left  Result Date: 07/03/2022 CLINICAL DATA:  Fall.  Left shoulder pain. EXAM: LEFT SHOULDER - 4 VIEW COMPARISON:  Two-view chest x-ray 06/05/2022 FINDINGS: A comminuted fracture of the distal clavicle is displaced up to 1/2 the shaft with. Glenohumeral joint is intact. No additional  fractures are present. Extensive chronic fibrotic changes are present in the lung. IMPRESSION: Comminuted fracture of the distal clavicle with up to 1/2 the shaft with displacement. Electronically Signed  By: San Morelle M.D.   On: 07/03/2022 09:20   DG Pelvis Portable  Result Date: 07/03/2022 CLINICAL DATA:  Fall. EXAM: PORTABLE PELVIS 1 VIEWS COMPARISON:  One-view pelvis 04/14/2022. FINDINGS: Right hip arthroplasty noted. No acute fractures are present. Mild degenerative changes are again noted at the left hip. Vascular calcifications are present. Calcified uterine fibroid noted. IMPRESSION: 1. No acute abnormality. 2. Right hip arthroplasty without radiographic evidence for complication. Electronically Signed   By: San Morelle M.D.   On: 07/03/2022 09:17    Microbiology: Recent Results (from the past 240 hour(s))  Resp panel by RT-PCR (RSV, Flu A&B, Covid) Anterior Nasal Swab     Status: None   Collection Time: 08/05/2022 12:06 PM   Specimen: Anterior Nasal Swab  Result Value Ref Range Status   SARS Coronavirus 2 by RT PCR NEGATIVE NEGATIVE Final    Comment: (NOTE) SARS-CoV-2 target nucleic acids are NOT DETECTED.  The SARS-CoV-2 RNA is generally detectable in upper respiratory specimens during the acute phase of infection. The lowest concentration of SARS-CoV-2 viral copies this assay can detect is 138 copies/mL. A negative result does not preclude SARS-Cov-2 infection and should not be used as the sole basis for treatment or other patient management decisions. A negative result may occur with  improper specimen collection/handling, submission of specimen other than nasopharyngeal swab, presence of viral mutation(s) within the areas targeted by this assay, and inadequate number of viral copies(<138 copies/mL). A negative result must be combined with clinical observations, patient history, and epidemiological information. The expected result is Negative.  Fact Sheet  for Patients:  EntrepreneurPulse.com.au  Fact Sheet for Healthcare Providers:  IncredibleEmployment.be  This test is no t yet approved or cleared by the [place] FDA and  has been authorized for detection and/or diagnosis of SARS-CoV-2 by FDA under an Emergency Use Authorization (EUA). This EUA will remain  in effect (meaning this test can be used) for the duration of the COVID-19 declaration under Section 564(b)(1) of the Act, 21 U.S.C.section 360bbb-3(b)(1), unless the authorization is terminated  or revoked sooner.       Influenza A by PCR NEGATIVE NEGATIVE Final   Influenza B by PCR NEGATIVE NEGATIVE Final    Comment: (NOTE) The Xpert Xpress SARS-CoV-2/FLU/RSV plus assay is intended as an aid in the diagnosis of influenza from Nasopharyngeal swab specimens and should not be used as a sole basis for treatment. Nasal washings and aspirates are unacceptable for Xpert Xpress SARS-CoV-2/FLU/RSV testing.  Fact Sheet for Patients: EntrepreneurPulse.com.au  Fact Sheet for Healthcare Providers: IncredibleEmployment.be  This test is not yet approved or cleared by the [place] FDA and has been authorized for detection and/or diagnosis of SARS-CoV-2 by FDA under an Emergency Use Authorization (EUA). This EUA will remain in effect (meaning this test can be used) for the duration of the COVID-19 declaration under Section 564(b)(1) of the Act, 21 U.S.C. section 360bbb-3(b)(1), unless the authorization is terminated or revoked.     Resp Syncytial Virus by PCR NEGATIVE NEGATIVE Final    Comment: (NOTE) Fact Sheet for Patients: EntrepreneurPulse.com.au  Fact Sheet for Healthcare Providers: IncredibleEmployment.be  This test is not yet approved or cleared by the [place] FDA and has been authorized for detection and/or diagnosis of SARS-CoV-2 by FDA under an  Emergency Use Authorization (EUA). This EUA will remain in effect (meaning this test can be used) for the duration of the COVID-19 declaration under Section 564(b)(1) of the Act, 21 U.S.C. section 360bbb-3(b)(1), unless the  authorization is terminated or revoked.  Performed at [facility], [place] [address], [place], [place] [address]     Time spent: 35 minutes  Signed: Barb Merino, MD 07/31/2022

## 2022-08-11 NOTE — Progress Notes (Signed)
  Daily Progress Note   Patient Name: [name]       Date: 08/19/22 DOB: 1946/03/15  Age: 77 y.o. MRN#: [id number] Attending Physician: Barb Merino, MD Primary Care Physician: Isaac Bliss, Rayford Halsted, MD Admit Date: 07/20/2022 Length of Stay: 1 day  Will complete a full consult note as soon as able.  Placing this note as change in patient's medical status.  After extensive discussions with family and patient's continued deterioration, transition to comfort focused care at this time.  Will start medications to address comfort.  Once patient more comfortable, BiPAP will be removed and patient will be allowed to have a natural death.  Chelsea Aus, DO Palliative Care Provider PMT # [phone]

## 2022-08-11 NOTE — Progress Notes (Addendum)
       Overnight   NAME: [name] MRN: [id number] DOB : 09/18/1945    Date of Service   2022/08/14   HPI/Events of Note   77 year old female past medical history of left eye blindness, COPD with emphysema, pulmonary fibrosis, sarcoidosis, depression, hypertension, GERD, multiple valvular insufficiencies.  Admitted on 08/01/2022 for shortness of breath.  Seen by palliative. ========================================= Notified by RN of unresponsive patient. On arrival to bedside patient maintained pulse and was being bagged by staff. BP.HR were maintained above 100 SBP and 80s HR.  Patient has received narcotics during stay.  In light of this, patient was given Narcan with minimal response, Dose was repeated with more obvious response to include patient awake and following some commands.  Patient breathing on her own, patient is DNR with no qualification of use or not, of noninvasive PPV.  Notified attending physician who arrived in room. Multiple attempts at contacting family were unsuccessful by NP and attending physician. Patient was transferred to SDU on BiPAP.  Likely respiratory depression secondary to suspected buildup of narcotics   Interventions/ Plan   Narcan given with results Transfer to stepdown BiPAP ordered ABG ordered/pending Narcan ordered PRN       Gershon Cull BSN MSNA MSN ACNPC-AG Acute Care Nurse Practitioner [place]

## 2022-08-11 NOTE — Progress Notes (Signed)
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  In the emergency room afebrile.  Respiratory 24.  Blood pressure stable.  Oxygenation adequate on 2 L oxygen.  Due to significant shortness of breath and wheezing she was admitted to the hospital.  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 12/19, goal of care discussion, seen by palliative care.  Continued care.  Changed to DNR. 12/20, early morning hours patient was unresponsive with severe hypercapnia.  No cardiac arrest.  Transferred to stepdown unit on BiPAP, remains encephalopathic.    Assessment & Plan:   Acute metabolic encephalopathy, unresponsiveness and respiratory arrest: Respiratory arrest secondary to hypercapnia.  pH 7.13 with CO2 105.  Chest x-ray with extensive fibrotic changes.  Remains on BiPAP, poor response. Narcan x 2, had received 2 doses of oxycodone in the evening but less likely causing any encephalopathy. Poor prognosis. Goal of care discussion ongoing as below.  Chronic hypoxemia due to lung disease: Pulmonary fibrosis and COPD with exacerbation: Currently decompensated on BiPAP as above.  Severe tricuspid regurgitation, right-sided congestive heart failure with severe pulmonary hypertension.  Sarcoid destroyed lungs: BNP 439, recent echocardiogram with grade 1 diastolic dysfunction, severe tricuspid valve regurgitation.  End-stage COPD with severe pulmonary hypertension.  See goal of  care discussion below.  Chronic medical issues including GERD, on PPI Hyperlipidemia, on statins.  Discontinued. Paroxysmal SVT, currently sinus rhythm.  On metoprolol.  Goal of care discussion: Overnight events noted.  Patient and family were updated yesterday about poor prognosis. Overnight events discussed with patient's daughter, patient's sister at the bedside.  Patient is intermittently responsive but does not follow any commands.  Currently remains critically sick on a BiPAP support.  Mental status not improved despite improving CO2 levels.  Continue BiPAP. Updated about patient's critical status and probably end-of-life. Plan to continue supportive care now, palliative care team to meet with the family. I did recommend patient will be best served with comfort care and hospice level of care.     DVT prophylaxis: SCDs Start: 08/07/2022 1513   Code Status: DNR/DNI Family Communication: Daughter and multiple family members at the bedside Disposition Plan: Status is: Inpatient.  Critically ill.  Stepdown unit.   Consultants:  Palliative  Procedures:  None  Antimicrobials:  Azithromycin 12/19---   Subjective: Examined.  Overnight events noted.  Patient withdrawals but does not follow any commands.  Currently on BiPAP since last 2 hours.  Objective: Vitals:   Aug 20, 2022 0900 August 20, 2022 1000 Aug 20, 2022 1100 08-20-2022 1104  BP: (!) 90/49 (!) 100/54 (!) 109/55   Pulse: 76     Resp: '18 18 19 20  '$ Temp:      TempSrc:      SpO2: 98%   99%  Weight:      Height:        Intake/Output Summary (Last 24 hours) at 08-20-2022 1123 Last data filed at 07/29/2022 1523 Gross per 24 hour  Intake 360 ml  Output --  Net 360 ml    Filed Weights   07/18/2022 1135 07/29/22 0813 08-20-22 0500  Weight:  46.2 kg 48.1 kg 45.1 kg    Examination:  General exam: Sick looking.  Barely responsive.  On BiPAP with 40% FiO2. Respiratory system: Poor bilateral air entry.  Mostly conducted upper  airway sounds. Cardiovascular system: S1 & S2 heard, RRR.  Loud pansystolic murmur right parasternal.  No pedal edema. Gastrointestinal system: Abdomen is nondistended, soft and nontender. No organomegaly or masses felt. Normal bowel sounds heard. Central nervous system: Unresponsive.  Not following commands.   Data Reviewed: I have personally reviewed following labs and imaging studies  CBC: Recent Labs  Lab 07/18/2022 1235 07/29/22 0826  WBC 8.3 4.6  HGB 9.7* 9.1*  HCT 32.5* 29.8*  MCV 80.0 78.8*  PLT 189 784    Basic Metabolic Panel: Recent Labs  Lab 08/04/2022 1235 07/29/22 0826 23-Aug-2022 0821  NA 129* 132* 132*  K 4.9 4.8 4.9  CL 89* 89* 90*  CO2 34* 32 29  GLUCOSE 96 126* 117*  BUN 19 33* 44*  CREATININE 1.02* 1.07* 2.51*  CALCIUM 8.9 8.4* 8.1*    GFR: Estimated Creatinine Clearance: 13.6 mL/min (A) (by C-G formula based on SCr of 2.51 mg/dL (H)). Liver Function Tests: Recent Labs  Lab 07/15/2022 1235 07/29/22 0826  AST 19 27  ALT 14 21  ALKPHOS 72 77  BILITOT 0.6 0.6  PROT 7.7 7.2  ALBUMIN 3.0* 3.1*    No results for input(s): "LIPASE", "AMYLASE" in the last 168 hours. No results for input(s): "AMMONIA" in the last 168 hours. Coagulation Profile: No results for input(s): "INR", "PROTIME" in the last 168 hours. Cardiac Enzymes: No results for input(s): "CKTOTAL", "CKMB", "CKMBINDEX", "TROPONINI" in the last 168 hours. BNP (last 3 results) No results for input(s): "PROBNP" in the last 8760 hours. HbA1C: No results for input(s): "HGBA1C" in the last 72 hours. CBG: Recent Labs  Lab 08/23/2022 0500  GLUCAP 279*   Lipid Profile: No results for input(s): "CHOL", "HDL", "LDLCALC", "TRIG", "CHOLHDL", "LDLDIRECT" in the last 72 hours. Thyroid Function Tests: No results for input(s): "TSH", "T4TOTAL", "FREET4", "T3FREE", "THYROIDAB" in the last 72 hours. Anemia Panel: No results for input(s): "VITAMINB12", "FOLATE", "FERRITIN", "TIBC", "IRON", "RETICCTPCT"  in the last 72 hours. Sepsis Labs: No results for input(s): "PROCALCITON", "LATICACIDVEN" in the last 168 hours.  Recent Results (from the past 240 hour(s))  Resp panel by RT-PCR (RSV, Flu A&B, Covid) Anterior Nasal Swab     Status: None   Collection Time: 08/09/2022 12:06 PM   Specimen: Anterior Nasal Swab  Result Value Ref Range Status   SARS Coronavirus 2 by RT PCR NEGATIVE NEGATIVE Final    Comment: (NOTE) SARS-CoV-2 target nucleic acids are NOT DETECTED.  The SARS-CoV-2 RNA is generally detectable in upper respiratory specimens during the acute phase of infection. The lowest concentration of SARS-CoV-2 viral copies this assay can detect is 138 copies/mL. A negative result does not preclude SARS-Cov-2 infection and should not be used as the sole basis for treatment or other patient management decisions. A negative result may occur with  improper specimen collection/handling, submission of specimen other than nasopharyngeal swab, presence of viral mutation(s) within the areas targeted by this assay, and inadequate number of viral copies(<138 copies/mL). A negative result must be combined with clinical observations, patient history, and epidemiological information. The expected result is Negative.  Fact Sheet for Patients:  EntrepreneurPulse.com.au  Fact Sheet for Healthcare Providers:  IncredibleEmployment.be  This test is no t yet approved or cleared by the [place] and  has been authorized for  detection and/or diagnosis of SARS-CoV-2 by FDA under an Emergency Use Authorization (EUA). This EUA will remain  in effect (meaning this test can be used) for the duration of the COVID-19 declaration under Section 564(b)(1) of the Act, 21 U.S.C.section 360bbb-3(b)(1), unless the authorization is terminated  or revoked sooner.       Influenza A by PCR NEGATIVE NEGATIVE Final   Influenza B by PCR NEGATIVE NEGATIVE Final    Comment:  (NOTE) The Xpert Xpress SARS-CoV-2/FLU/RSV plus assay is intended as an aid in the diagnosis of influenza from Nasopharyngeal swab specimens and should not be used as a sole basis for treatment. Nasal washings and aspirates are unacceptable for Xpert Xpress SARS-CoV-2/FLU/RSV testing.  Fact Sheet for Patients: EntrepreneurPulse.com.au  Fact Sheet for Healthcare Providers: IncredibleEmployment.be  This test is not yet approved or cleared by the [place] FDA and has been authorized for detection and/or diagnosis of SARS-CoV-2 by FDA under an Emergency Use Authorization (EUA). This EUA will remain in effect (meaning this test can be used) for the duration of the COVID-19 declaration under Section 564(b)(1) of the Act, 21 U.S.C. section 360bbb-3(b)(1), unless the authorization is terminated or revoked.     Resp Syncytial Virus by PCR NEGATIVE NEGATIVE Final    Comment: (NOTE) Fact Sheet for Patients: EntrepreneurPulse.com.au  Fact Sheet for Healthcare Providers: IncredibleEmployment.be  This test is not yet approved or cleared by the [place] FDA and has been authorized for detection and/or diagnosis of SARS-CoV-2 by FDA under an Emergency Use Authorization (EUA). This EUA will remain in effect (meaning this test can be used) for the duration of the COVID-19 declaration under Section 564(b)(1) of the Act, 21 U.S.C. section 360bbb-3(b)(1), unless the authorization is terminated or revoked.  Performed at [facility], [place] [address], [place], [place] [address]          Radiology Studies: DG CHEST PORT 1 VIEW  Result Date: Aug 08, 2022 CLINICAL DATA:  Dyspnea EXAM: PORTABLE CHEST 1 VIEW COMPARISON:  08/03/2022 FINDINGS: Diffuse interstitial and alveolar opacity identified consistent with fibrosis and possible superimposed pneumonia. Pleural-based density left upper hemithorax.  No pneumothorax identified. Extensive calcified adenopathy identified presumably related to prior granulomatous disease. IMPRESSION: Extensive diffuse interstitial and alveolar opacities consistent with fibrosis and superimposed infection or pulmonary edema. Electronically Signed   By: Sammie Bench M.D.   On: 2022/08/08 09:43   DG Chest Port 1 View  Result Date: 08/02/2022 CLINICAL DATA:  Cough and dyspnea. EXAM: PORTABLE CHEST 1 VIEW COMPARISON:  Chest radiograph from 07/05/2022 and 06/14/2022. FINDINGS: Stable cardiomediastinal contours. Extensive calcified mediastinal and hilar lymph nodes are again noted. 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 Persistent opacities within the right upper lobe and right lower lobe are noted compared with 07/05/2022. These are new compared with 06/14/2022. Small right pleural effusion is again noted and appears unchanged. IMPRESSION: 1. Persistent opacities within the right upper lobe and right lower lobe compatible with atelectasis and/or pneumonia. 2. Stable small right pleural effusion. 3. 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 4. Stable calcified mediastinal and hilar lymph nodes. Electronically Signed   By: Kerby Moors M.D.   On: 08/02/2022 12:33        Scheduled Meds:  azithromycin  500 mg Oral Daily   Chlorhexidine Gluconate Cloth  6 each Topical Daily   famotidine  20 mg Oral QHS   gabapentin  100 mg Oral  BID   ipratropium-albuterol  3 mL Nebulization BID   melatonin  5 mg Oral QHS   metoprolol succinate  50 mg Oral Daily   mirtazapine  15 mg Oral QHS   pantoprazole  40 mg Oral Daily   Continuous Infusions:   LOS: 1 day    Time spent: 50 minutes    Barb Merino, MD [facility] Pager [phone]

## 2022-08-11 NOTE — Progress Notes (Addendum)
Daily Progress Note   Patient Name: [name]       Date: 08/22/2022 DOB: 09/11/1945  Age: 77 y.o. MRN#: [id number] Attending Physician: Barb Merino, MD Primary Care Physician: Isaac Bliss, Rayford Halsted, MD Admit Date: 07/14/2022 Length of Stay: 1 day  Reason for Consultation/Follow-up: Establishing goals of care and symptom management  Subjective:   CC: Patient unresponsive on BiPAP.  Family meeting held today at bedside with all of patient's children and 15+ family members.  Subjective:  Extensive EMR review prior to seeing patient.  Patient's aspiratory status deteriorated overnight.  Of note patient received Narcan x2 for concern of opioid overdose though based on timing of low dose oral medications provided and review or lab work today, patient's respiratory deterioration in status is in the setting of her extensive underlying lung disease with known acute worsening, do not believe opioids contributed to patient's medical status deterioration. Especially since patient had been tolerating prior doses of opioid medications without any adverse affects.   Discussed care with bedside RN prior to seeing patient. Primary hospitalist had already visited and updated family today. Able to meet in afternoon once all family members had arrived as everyone wanted to be present for discussions. Able to meet with patient's 4 children, sisters, nieces, nephews, and multiple other family members at patient's bedside. Patient remains on on BiPAP encephalopathic and minimally responsive to pain.  Patient does appear to be in pain but showing nonverbal signs such as grimacing, fidgeting, and increased work of breathing.  ------------------------------------------------------------------------------------------------------------- Advance Care Planning Conversation  Pertinent diagnosis:   The patient and/or family consented to a voluntary Solicitor. Individuals present  for the conversation: Patient unable to participate in complex medical decision taking due to medical status.  Extensive conversation with patient's family members at bedside including 4 children.  Palliative provider was present during entirety of conversations.  Summary of the conversation:  Spent time informing all family members about patient's medical status deterioration in the setting of her underlying known extensive lung disease.  Had been discussing with family patient going home with hospice as patient had expressed yesterday she was reaching the end of her life and she was ready to "go home when God called her".  Able to share about the extensive visit I had with patient yesterday to family members and her expressions of peace over when it is her time to die she is comfortable with this and passing on to go see her husband who died in 14-Nov-2014.  Family appropriately tearful during this discussion.  Some family members agreed that patient had expressed wishes of being at peace with death.  Able to discuss that in patient's current state, she has an underlying disease that cannot be fixed.  What we can do for the patient is transition to a comfort focused care and make sure she is comfortable for however long she does have left in her life.  Able to describe in detail the process of transitioning to comfort focused care and providing medications for this.  Expressed that once patient is comfortable, BiPAP will be removed as that is not comfortable and patient had stated she never wanted to be kept alive on machines.  Family acknowledging this and agreeing with transition to comfort focused care at this time.  Outcome of the conversations and/or documents completed:  Patient being transition to comfort focused care at this time.  Patient will likely have an in-hospital death.  I spent 46 minutes providing separately  identifiable ACP services with the patient and/or surrogate decision maker in a  voluntary, in-person conversation discussing the patient's wishes and goals as detailed in the above note.  Chelsea Aus, DO Palliative Care Provider  -------------------------------------------------------------------------------------------------------------  Spent extensive time answering questions and providing emotional support as able. Family voiced appreciation for all the care and support the patient and they have received.  Updated bedside RN and hospitalist regarding transition to comfort focused care.  Review of Systems  Objective:   Vital Signs:  BP (!) 85/48   Pulse 74   Temp (!) 97.5 F (36.4 C) (Axillary)   Resp 20   Ht '5\' 4"'$  (1.626 m)   Wt 45.1 kg   SpO2 100%   BMI 17.07 kg/m   Physical Exam: General: Grimacing, minimally responsive to pain stimuli, chronically ill-appearing Eyes: No drainage noted HENT: Dry mucous membranes, BiPAP in place Cardiovascular: RRR Respiratory: Increased work of breathing noted on BiPAP, accessory muscle use present Abdomen: not distended Extremities: Muscle wasting present in all extremities Skin: no rashes or lesions on visible skin Neuro: Lethargic, minimally responsive to pain stimuli  Imaging:  I personally reviewed recent imaging.   Assessment & Plan:   Assessment: Patient is a 77 year old female with a past medical history of left eye blindness, COPD with emphysema, pulmonary fibrosis, sarcoidosis, depression, hypertension, GERD, and multiple valvular insufficiencies who was admitted on 07/16/2022 for management of shortness of breath associated with wheezing and difficulty breathing.  Patient has had multiple recurrent hospitalizations and admissions for hypoxia management.  During hospitalization patient has received therapies for COPD management and fluid overload.  Palliative medicine team consulted to discuss complex medical decision making.   Recommendations/Plan: # Complex medical decision making/goals of  care  -Unable to participate in medical decision making secondary to his mental status.  -Spoke with extensive discussions with multiple family members as described above in detail in HPI.  After discussions, will be transitioning to comfort focused care at this time as patient has underlying known severe lung disease that cannot be reversed.  Family 's priority is patient not suffering at the end of her life.  -At this time we will discontinue interventions that are no longer focused on comfort such as IV fluids, imaging, or lab work.  Will instead focus on symptom management of pain, dyspnea, and agitation in the setting of end-of-life care.  - Code Status: DNR   # Symptom management    -Pain/Dyspnea, acute in the setting of end stage dementia for end-of-life care                Patient was not on medications for pain previously.                               -Started IV Dilaudid 0.'5mg'$ /hr due to patient's worsening respiratory status with increased work of breathing which will continue to worsen once BiPAP discontinued. Family notes patient would want to be comfortable and they wish that for her as well. Has IV dilaudid '1mg'$  every 1 hour as needed for breakthrough.  Continue to adjust based on patient's symptom burden.                    -Anxiety/agitation, in the setting of end-of-life care                               -  Started IV Ativan 1 mg every 4 hours as needed. Continue to adjust based on patient's symptom burden.                                 -And also has IV Haldol 0.5 mg every 4 hours as needed. Continue to adjust based on patient's symptom burden.                   -Secretions, in the setting of end-of-life care                               -Started IV glycopyrrolate 0.2 mg every 4 hours as needed.  # Psychosocial Support:  -Multiple family members including patient's 4 children at bedside as described above in HPI.  -Placed spiritual support consult.  # Discharge Planning:  Being transition to comfort focused care at this time due to her extensive underlying lung disease.  Patient will most likely have an in-hospital death.  If stabilizes enough, could consider inpatient hospice though concerned about patient's respiratory status remains.  Discussed with: Hospitalist, bedside RN, multiple family members -Also informed [facility] hospice representative of transition to comfort focused care (Daughter agreed on this provider discussing care with Benjamine Mola from [place]).   Thank you for allowing the palliative care team to participate in the care [place].  Chelsea Aus, DO Palliative Care Provider PMT # [phone]  If patient remains symptomatic despite maximum doses, please call PMT at [phone] between 0700 and 1900. Outside of these hours, please call attending, as PMT does not have night coverage.

## 2022-08-11 NOTE — TOC Initial Note (Signed)
Transition of Care [facility]) - Initial/Assessment Note    Patient Details  Name: [name] MRN: [id number] Date of Birth: Jun 08, 1946  [facility] CM/SW Contact:    Roseanne Kaufman, RN Phone Number: 2022/08/07, 5:23 PM  Clinical Narrative:    Per chart review, patient transitioning to comfort care, potential hospital death. No current TOC needs at this time.   TOC will continue to follow.               Planned Disposition: Home Hospice Barriers to Discharge: Continued Medical Work up   Patient Goals and CMS Choice     Choice offered to / list presented to : NA      Expected Discharge Plan and Services Planned Disposition: Home Hospice In-house Referral: Chaplain, Hospice / Palliative Care Discharge Planning Services: CM Consult Post Acute Care Choice: NA Living arrangements for the past 2 months: Single Family Home                 DME Arranged: N/A DME Agency: NA       HH Arranged: NA HH Agency: NA        Prior Living Arrangements/Services Living arrangements for the past 2 months: Single Family Home Lives with:: Adult Children                   Activities of Daily Living Home Assistive Devices/Equipment: Wheelchair, Oxygen, Nebulizer ([place]) ADL Screening (condition at time of admission) Patient's cognitive ability adequate to safely complete daily activities?: Yes Is the patient deaf or have difficulty hearing?: No Does the patient have difficulty seeing, even when wearing glasses/contacts?: No Does the patient have difficulty concentrating, remembering, or making decisions?: No Patient able to express need for assistance with ADLs?: Yes Does the patient have difficulty dressing or bathing?: No Independently performs ADLs?: Yes (appropriate for developmental age) Communication: Independent Dressing (OT): Independent Grooming: Needs assistance Is this a change from baseline?: Pre-admission baseline Feeding: Independent Bathing:  Needs assistance Is this a change from baseline?: Pre-admission baseline Toileting: Independent Is this a change from baseline?: Pre-admission baseline In/Out Bed: Independent Walks in Home: Independent with device (comment) Does the patient have difficulty walking or climbing stairs?: Yes Weakness of Legs: None Weakness of Arms/Hands: None  Permission Sought/Granted                  Emotional Assessment           Psych Involvement: No (comment)  Admission diagnosis:  Acute on chronic respiratory failure with hypoxia ([place]) [J96.21] COPD with acute exacerbation ([place]) [J44.1] Patient Active Problem List   Diagnosis Date Noted   Need for emotional support 2022-08-07   Agitation August 07, 2022   Coordination of complex care 08-07-2022   End of life care August 07, 2022   DNR (do not resuscitate) 07/29/2022   High risk medication use 07/29/2022   Goals of care, counseling/discussion 07/29/2022   Palliative care encounter 07/29/2022   Acute on chronic respiratory failure with hypoxia ([place]) 07/27/2022   Acute on chronic diastolic congestive heart failure ([place]) 07/15/2022   Acute on chronic respiratory failure with hypoxia and hypercapnia ([place]) 07/05/2022   Clavicle fracture 07/05/2022   Acute on chronic respiratory failure with hypercapnia ([place]) 06/14/2022   Paroxysmal SVT (supraventricular tachycardia) 04/16/2022   Sepsis without end organ damage 04/14/2022   Hyperkalemia 04/14/2022   Pulmonary fibrosis ([place]) 04/14/2022   Protein-calorie malnutrition, moderate ([place]) 04/14/2022   Alcohol use 04/14/2022   Lower extremity weakness 04/14/2022  Dyspnea 10/24/2021   Head trauma 10/24/2021   Vitamin B12 deficiency 10/22/2021   Posterior dislocation of right hip ([place]) 04/08/2021   Genetic testing 09/04/2020   Family history of breast cancer    Chronic respiratory failure with hypoxia ([place]) 08/28/2019   PNA (pneumonia) 08/06/2019   Acute respiratory failure with hypoxia ([place])     Shock ([place])    Central venous catheter in place    AKI (acute kidney injury) ([place])    Pulmonary hypertension ([place])    Hyponatremia    Hypoglycemia    Acute metabolic encephalopathy    Severe sepsis ([place]) 05/31/2019   Low bone mass 05/24/2018   IBS (irritable bowel syndrome) 03/01/2018   Diarrhea 12/22/2017   Falls 12/26/2015   Mixed incontinence urge and stress 12/26/2015   Hypokalemia 10/03/2015   Encounter for central line placement 09/12/2014   Dyslipidemia 12/31/2012   Allergic rhinitis 12/31/2012   Osteoarthritis, hip, bilateral 11/01/2010   Unintentional weight loss 03/19/2009   COPD with acute exacerbation ([place]) 02/28/2009   Sarcoidosis 02/05/2009   GERD 01/18/2009   Essential hypertension, benign 06/08/2007   PCP:  Isaac Bliss, Rayford Halsted, MD Pharmacy:   [facility] [place], [place] - [address] [place] [address] Phone: [phone] Fax: [phone]     Social Determinants of Health ([place]) Social History: [place]: No Food Insecurity (07/23/2022)  Housing: Low Risk  (07/15/2022)  Transportation Needs: No Transportation Needs (07/27/2022)  Utilities: Not At Risk (07/12/2022)  Depression (PHQ2-9): Low Risk  (04/28/2022)  Tobacco Use: Medium Risk (07/18/2022)   SDOH Interventions:     Readmission Risk Interventions    07/14/2022    8:29 AM 07/09/2022    1:17 PM 07/08/2022   10:19 AM  Readmission Risk Prevention Plan  Transportation Screening  Complete Complete  PCP or Specialist Appt within 3-5 Days   Complete  HRI or [place]   Complete  Social Work Consult for [place] Planning/Counseling   Complete  Palliative Care Screening   Not Applicable  Medication Review Press photographer) Complete  Complete  PCP or Specialist appointment within 3-5 days of discharge Complete    HRI or [place] Complete    SW Recovery  Care/Counseling Consult Complete    [place] Not Applicable

## 2022-08-11 NOTE — Progress Notes (Signed)
At approximately 0450 NT notified this RN that patient was unresponsive. RN entered room and observed patient was agonal breathing and not responding to any stimuli. RN checked for a pulse and pt still had one. RN observed that pt's nasal cannula was still properly in place and that upper dentures were dislodged in pt's mouth. NT sternal rubbed pt with no response. RN called for help and requested that the rapid response RN be notified and also requested an ambu bag. RN notified NP that pt was unresponsive. RN began bagging pt until rapid response and provider arrived.

## 2022-08-11 NOTE — Progress Notes (Signed)
PT Cancellation Note  Patient Details Name: [name] MRN: [id number] DOB: [birth year]   Cancelled Treatment:    Reason Eval/Treat Not Completed: Medical issues which prohibited therapy, recent  unresponsive episode. Will wait for further stable status.  [place] Office [phone] Weekend [phone].   Claretha Cooper 28-Aug-2022, 7:13 AM

## 2022-08-11 DEATH — deceased

## 2023-05-06 IMAGING — DX DG HIP (WITH OR WITHOUT PELVIS) 2-3V*R*
2 series · 2 of 2 positions shown · non-contrast
Comparison: None.

CLINICAL DATA: Close reduction of RIGHT hip dislocation.

EXAM:
DG HIP (WITH OR WITHOUT PELVIS) 2-3V RIGHT

[pelvis ap]
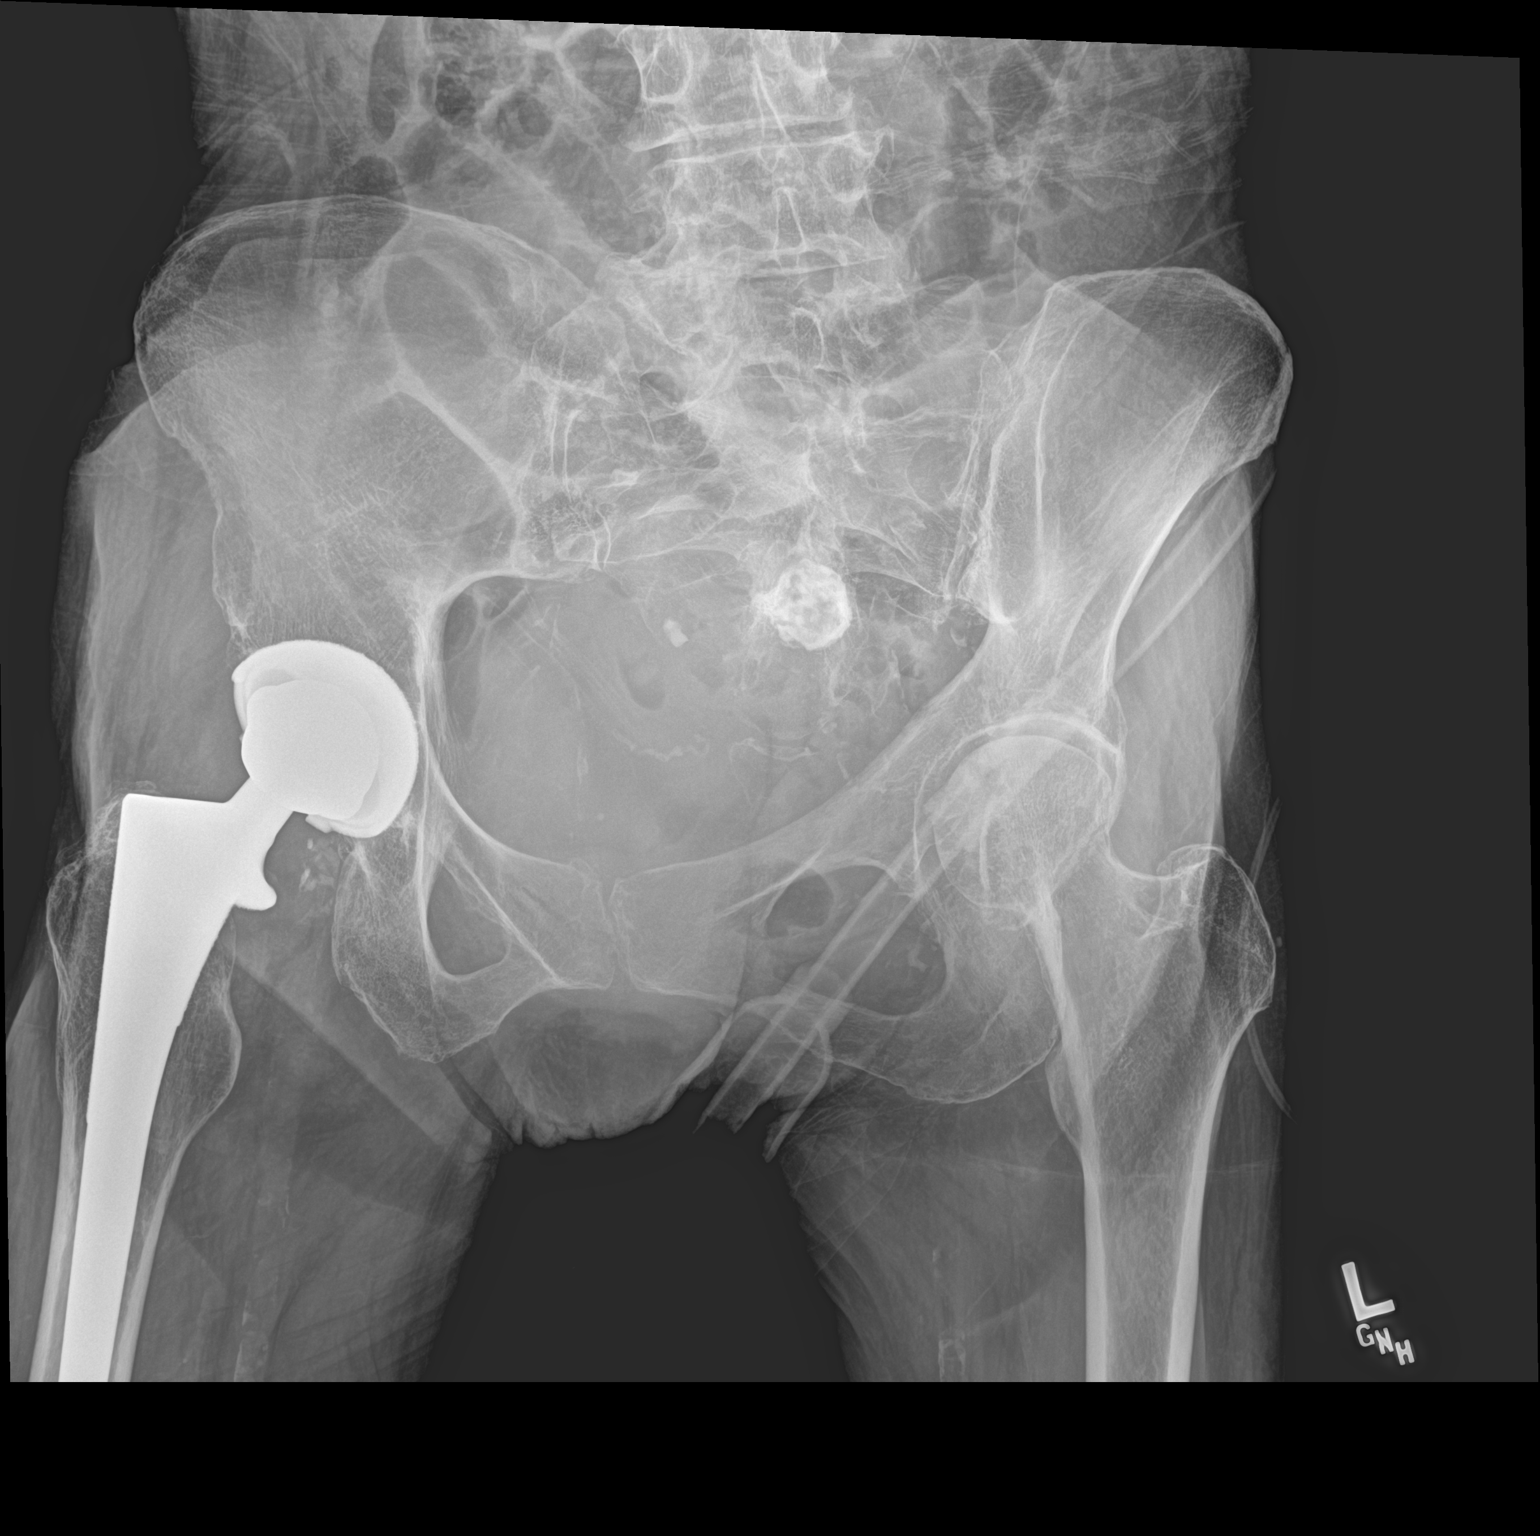

[hip lat]
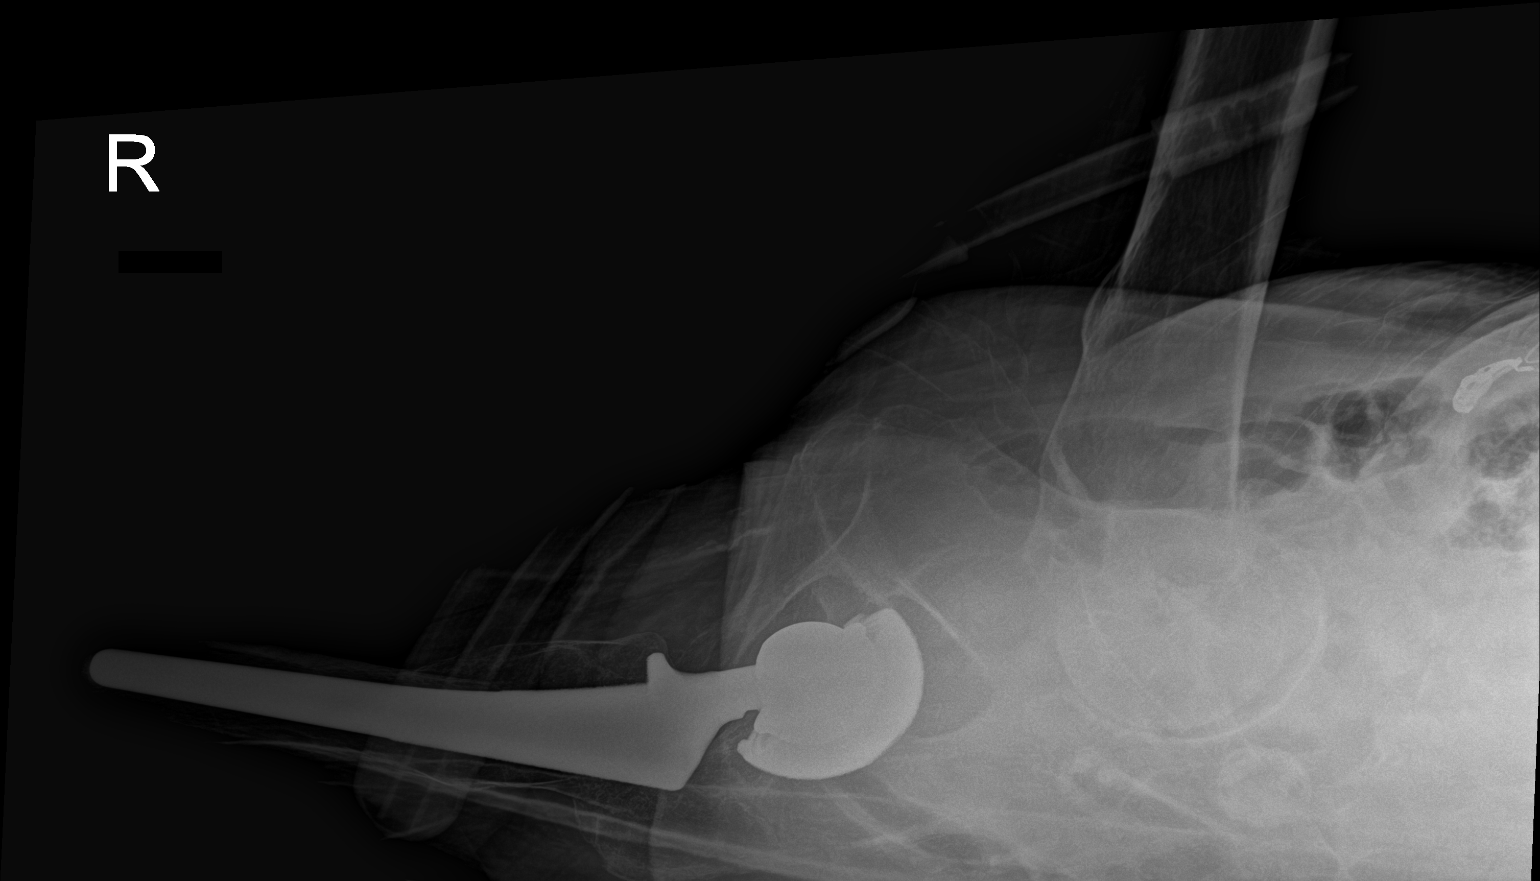

[2 of 2 positions shown; findings below may reference images not displayed]

FINDINGS: RIGHT total hip arthroplasty noted with relocation of the femoral
head component.

No other significant abnormalities noted.
IMPRESSION: Relocation of RIGHT hip femoral head component in acetabular cup.

## 2023-05-06 IMAGING — CR DG HIP (WITH OR WITHOUT PELVIS) 2-3V*R*
3 series · 3 of 3 positions shown · non-contrast
Comparison: None.

CLINICAL DATA: Status post fall.

EXAM:
DG HIP (WITH OR WITHOUT PELVIS) 2-3V RIGHT

[x pelvis (1 of 2)]
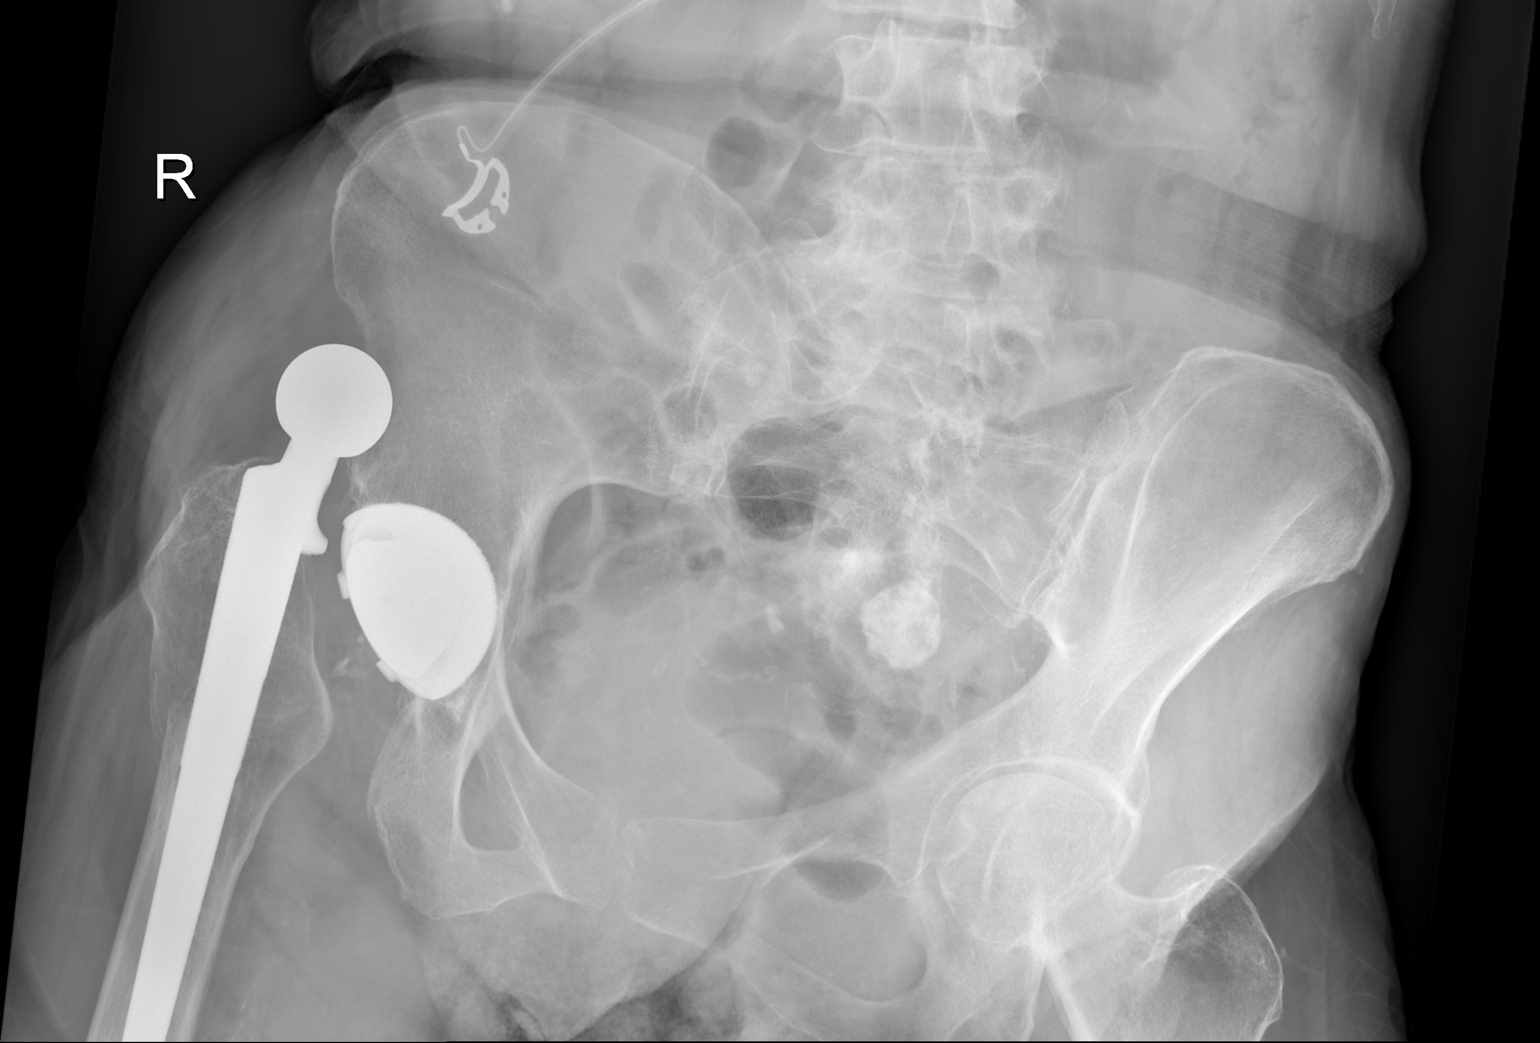

[x pelvis (2 of 2)]
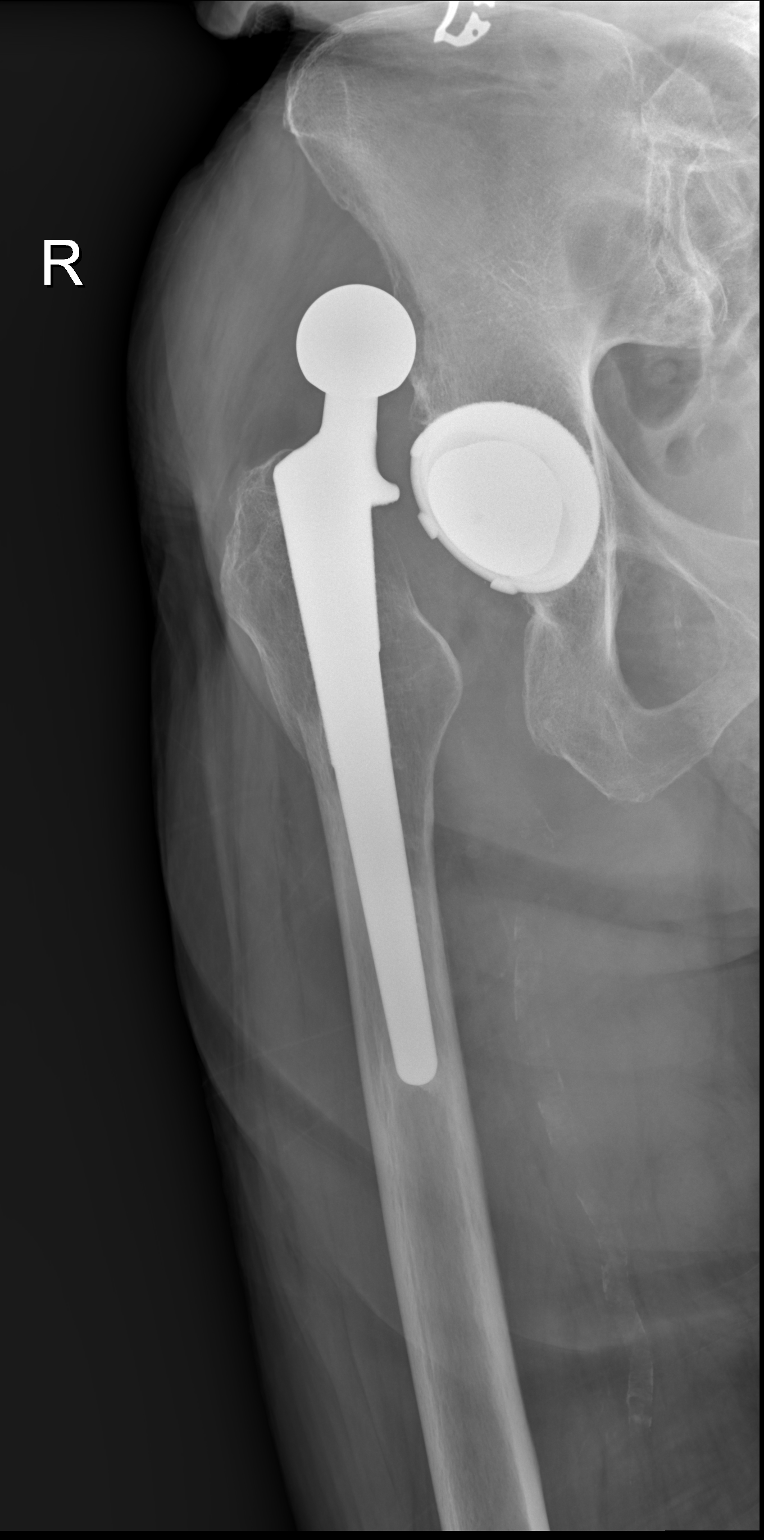

[w hip lat right]
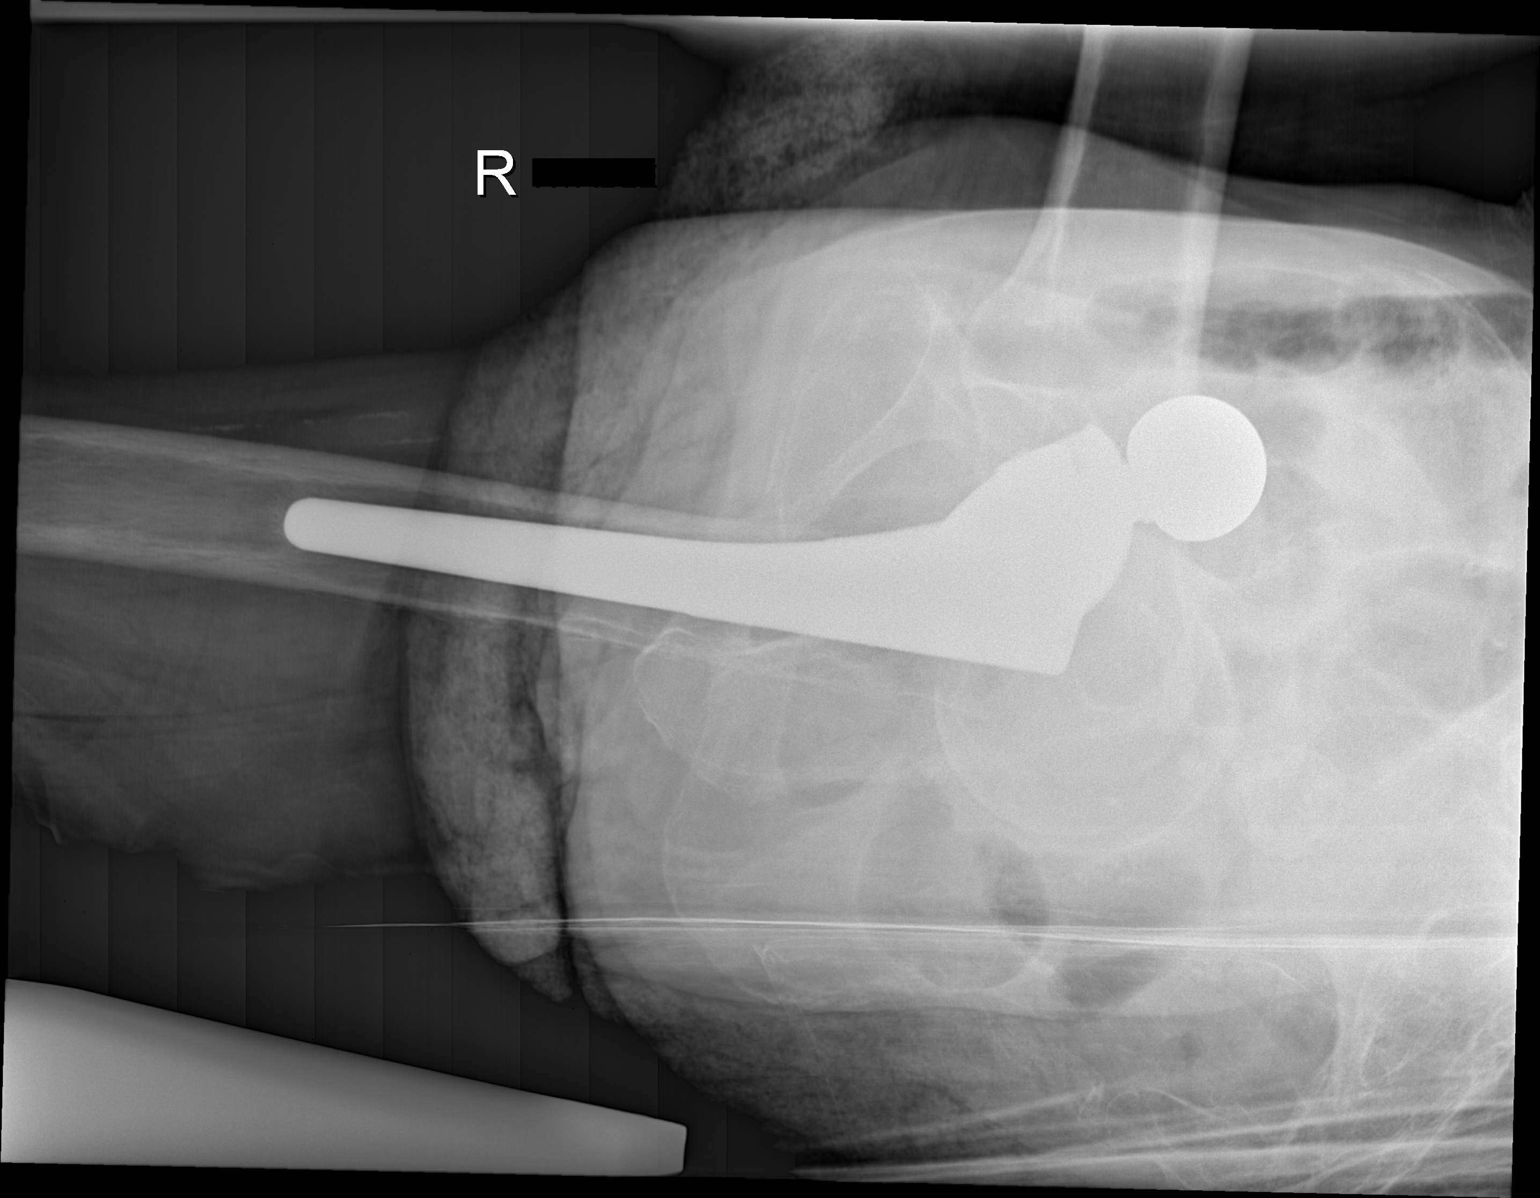

[3 of 3 positions shown; findings below may reference images not displayed]

FINDINGS: Generalized osteopenia.

Right hip arthroplasty. Superior dislocation of the right femoral
head relative to the acetabular cup. No acute fracture.

Mild osteoarthritis of the left hip. Mild osteoarthritis of
bilateral SI joints. Lower lumbar spine spondylosis.

Calcified pelvic mass likely reflecting a uterine fibroid.
IMPRESSION: 1. Superior dislocation of the right femoral head relative to the
acetabular cup.
